# Patient Record
Sex: Male | Born: 1965 | Race: White | Hispanic: No | Marital: Married | Smoking: Never smoker
Health system: Southern US, Community
[De-identification: ages and names within clinical notes are randomized; demographics above are authoritative.]

## PROBLEM LIST (undated history)

## (undated) DIAGNOSIS — Z8042 Family history of malignant neoplasm of prostate: Secondary | ICD-10-CM

## (undated) DIAGNOSIS — C189 Malignant neoplasm of colon, unspecified: Secondary | ICD-10-CM

## (undated) DIAGNOSIS — Z803 Family history of malignant neoplasm of breast: Secondary | ICD-10-CM

## (undated) DIAGNOSIS — Z8489 Family history of other specified conditions: Secondary | ICD-10-CM

## (undated) DIAGNOSIS — M1990 Unspecified osteoarthritis, unspecified site: Secondary | ICD-10-CM

## (undated) HISTORY — DX: Family history of malignant neoplasm of breast: Z80.3

## (undated) HISTORY — DX: Unspecified osteoarthritis, unspecified site: M19.90

## (undated) HISTORY — DX: Family history of malignant neoplasm of prostate: Z80.42

---

## 2002-01-15 ENCOUNTER — Emergency Department (HOSPITAL_COMMUNITY): Admission: EM | Admit: 2002-01-15 | Discharge: 2002-01-15 | Payer: Self-pay | Admitting: *Deleted

## 2002-01-15 ENCOUNTER — Encounter: Payer: Self-pay | Admitting: *Deleted

## 2005-11-22 ENCOUNTER — Emergency Department (HOSPITAL_COMMUNITY): Admission: EM | Admit: 2005-11-22 | Discharge: 2005-11-22 | Payer: Self-pay | Admitting: Emergency Medicine

## 2005-11-27 ENCOUNTER — Emergency Department (HOSPITAL_COMMUNITY): Admission: EM | Admit: 2005-11-27 | Discharge: 2005-11-27 | Payer: Self-pay | Admitting: *Deleted

## 2006-01-10 ENCOUNTER — Emergency Department (HOSPITAL_COMMUNITY): Admission: EM | Admit: 2006-01-10 | Discharge: 2006-01-10 | Payer: Self-pay | Admitting: *Deleted

## 2006-09-17 ENCOUNTER — Emergency Department (HOSPITAL_COMMUNITY): Admission: EM | Admit: 2006-09-17 | Discharge: 2006-09-17 | Payer: Self-pay | Admitting: Emergency Medicine

## 2009-01-20 ENCOUNTER — Emergency Department (HOSPITAL_COMMUNITY): Admission: EM | Admit: 2009-01-20 | Discharge: 2009-01-20 | Payer: Self-pay | Admitting: Emergency Medicine

## 2010-12-23 LAB — PROTIME-INR: INR: 1 (ref 0.00–1.49)

## 2010-12-23 LAB — FECAL LACTOFERRIN, QUANT: Fecal Lactoferrin: POSITIVE

## 2010-12-23 LAB — OVA AND PARASITE EXAMINATION

## 2010-12-23 LAB — DIFFERENTIAL
Basophils Absolute: 0 10*3/uL (ref 0.0–0.1)
Basophils Relative: 0 % (ref 0–1)
Eosinophils Absolute: 0 10*3/uL (ref 0.0–0.7)
Eosinophils Relative: 0 % (ref 0–5)
Monocytes Absolute: 0.7 10*3/uL (ref 0.1–1.0)
Monocytes Relative: 5 % (ref 3–12)

## 2010-12-23 LAB — CBC
HCT: 45.2 % (ref 39.0–52.0)
Platelets: 224 10*3/uL (ref 150–400)
RDW: 13.1 % (ref 11.5–15.5)
WBC: 12.7 10*3/uL — ABNORMAL HIGH (ref 4.0–10.5)

## 2010-12-23 LAB — CLOSTRIDIUM DIFFICILE EIA

## 2010-12-23 LAB — STOOL CULTURE

## 2010-12-23 LAB — COMPREHENSIVE METABOLIC PANEL
ALT: 28 U/L (ref 0–53)
AST: 23 U/L (ref 0–37)
Albumin: 4.1 g/dL (ref 3.5–5.2)
Alkaline Phosphatase: 83 U/L (ref 39–117)
Chloride: 108 mEq/L (ref 96–112)
Potassium: 3.9 mEq/L (ref 3.5–5.1)
Sodium: 139 mEq/L (ref 135–145)
Total Bilirubin: 1.2 mg/dL (ref 0.3–1.2)
Total Protein: 7.2 g/dL (ref 6.0–8.3)

## 2015-09-19 HISTORY — PX: SHOULDER SURGERY: SHX246

## 2017-09-22 ENCOUNTER — Ambulatory Visit: Payer: Self-pay | Admitting: Emergency Medicine

## 2018-11-10 ENCOUNTER — Other Ambulatory Visit: Payer: Self-pay

## 2018-11-10 ENCOUNTER — Encounter: Payer: Self-pay | Admitting: Emergency Medicine

## 2018-11-10 ENCOUNTER — Ambulatory Visit: Payer: BC Managed Care – PPO | Admitting: Emergency Medicine

## 2018-11-10 VITALS — BP 116/78 | HR 75 | Temp 98.5°F | Resp 16 | Ht 69.25 in | Wt 266.4 lb

## 2018-11-10 DIAGNOSIS — R531 Weakness: Secondary | ICD-10-CM | POA: Diagnosis not present

## 2018-11-10 DIAGNOSIS — Z1211 Encounter for screening for malignant neoplasm of colon: Secondary | ICD-10-CM | POA: Insufficient documentation

## 2018-11-10 DIAGNOSIS — R635 Abnormal weight gain: Secondary | ICD-10-CM | POA: Insufficient documentation

## 2018-11-10 DIAGNOSIS — Z6839 Body mass index (BMI) 39.0-39.9, adult: Secondary | ICD-10-CM | POA: Diagnosis not present

## 2018-11-10 DIAGNOSIS — E669 Obesity, unspecified: Secondary | ICD-10-CM | POA: Insufficient documentation

## 2018-11-10 DIAGNOSIS — M2550 Pain in unspecified joint: Secondary | ICD-10-CM | POA: Insufficient documentation

## 2018-11-10 DIAGNOSIS — G479 Sleep disorder, unspecified: Secondary | ICD-10-CM

## 2018-11-10 HISTORY — DX: Sleep disorder, unspecified: G47.9

## 2018-11-10 NOTE — Progress Notes (Signed)
Ricardo [name] 53 y.o.   Chief Complaint  Patient presents with  . Establish Care    joint pain in thumbs  . Weight Gain    per patient causing depression-the last year    HISTORY OF PRESENT ILLNESS: This is a 54 y.o. male complaining of several things: 1.  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  They hurt all the time. 2.  Weight gain: 36 pounds in 1 year, making him depressed. 3.  Snores.  He may have sleep apnea. 4.  Feels sluggish with low metabolism and low energy. Denies chronic medical problems.  No chronic medications.  No other significant symptoms. Not feeling well is making him sad and depressed.    HPI   Prior to Admission medications   Not on File    No Known Allergies  There are no active problems to display for this patient.   No past medical history on file.    Social History   Socioeconomic History  . Marital status: Single    Spouse name: Not on file  . Number of children: Not on file  . Years of education: Not on file  . Highest education level: Not on file  Occupational History  . Not on file  Social Needs  . Financial resource strain: Not on file  . Food insecurity:    Worry: Not on file    Inability: Not on file  . Transportation needs:    Medical: Not on file    Non-medical: Not on file  Tobacco Use  . Smoking status: Never Smoker  . Smokeless tobacco: Never Used  Substance and Sexual Activity  . Alcohol use: Never    Frequency: Never    Comment: rare  . Drug use: Never  . Sexual activity: Not on file  Lifestyle  . Physical activity:    Days per week: Not on file    Minutes per session: Not on file  . Stress: Not on file  Relationships  . Social connections:    Talks on phone: Not on file    Gets together: Not on file    Attends religious service: Not on file    Active member of club or organization: Not on file    Attends meetings of clubs or organizations: Not on file    Relationship status: Not on file    . Intimate partner violence:    Fear of current or ex partner: Not on file    Emotionally abused: Not on file    Physically abused: Not on file    Forced sexual activity: Not on file  Other Topics Concern  . Not on file  Social History Narrative  . Not on file    No family history on file.   Review of Systems  Constitutional: Positive for malaise/fatigue. Negative for chills and fever.       Weight gain  HENT: Negative.  Negative for nosebleeds and sore throat.   Eyes: Negative.   Respiratory: Negative.  Negative for cough and shortness of breath.   Cardiovascular: Negative.  Negative for chest pain and palpitations.  Gastrointestinal: Negative.  Negative for abdominal pain, nausea and vomiting.  Genitourinary: Negative.   Musculoskeletal: Positive for joint pain.  Skin: Negative.   Neurological: Negative for dizziness and headaches.  Endo/Heme/Allergies: Negative.   Psychiatric/Behavioral: Positive for depression.  All other systems reviewed and are negative.  Vitals:   11/10/18 1548  BP: 116/78  Pulse: 75  Resp: 16  Temp: 98.5 F (36.9 C)  SpO2: 97%     Physical Exam Vitals signs reviewed.  Constitutional:      Appearance: He is obese.  HENT:     Head: Normocephalic and atraumatic.     Mouth/Throat:     Mouth: Mucous membranes are moist.     Pharynx: Oropharynx is clear.  Eyes:     Extraocular Movements: Extraocular movements intact.     Conjunctiva/sclera: Conjunctivae normal.     Pupils: Pupils are equal, round, and reactive to light.  Neck:     Musculoskeletal: Normal range of motion.  Cardiovascular:     Rate and Rhythm: Normal rate and regular rhythm.     Pulses: Normal pulses.     Heart sounds: Normal heart sounds.  Pulmonary:     Effort: Pulmonary effort is normal.     Breath sounds: Normal breath sounds.  Abdominal:     Palpations: Abdomen is soft. There is no mass.     Tenderness: There is no abdominal tenderness.  Musculoskeletal: Normal  range of motion.  Skin:    General: Skin is warm and dry.     Capillary Refill: Capillary refill takes less than 2 seconds.  Neurological:     General: No focal deficit present.     Mental Status: He is alert and oriented to person, place, and time.  Psychiatric:        Mood and Affect: Mood normal.        Behavior: Behavior normal.      ASSESSMENT & PLAN: Ricardo [name] was seen today for establish care and weight gain.  Diagnoses and all orders for this visit:  General weakness -     CBC with Differential/Platelet -     Comprehensive metabolic panel -     TestT+TestF+SHBG -     TSH -     Hemoglobin A1c -     Lipid panel  Colon cancer screening -     Ambulatory referral to Gastroenterology  Body mass index (BMI) of 39.0-39.9 in adult -     Amb ref to Medical Nutrition Therapy-MNT  Arthralgia, unspecified joint -     Ambulatory referral to Rheumatology  Sleep disorder -     Ambulatory referral to Neurology  Weight gain    Patient Instructions       If you have lab work done today you will be contacted with your lab results within the next 2 weeks.  If you have not heard from [place] then please contact us. The fastest way to get your results is to register for My Chart.   IF you received an x-ray today, you will receive an invoice from [facility]. Please contact [facility] at [phone] with questions or concerns regarding your invoice.   IF you received labwork today, you will receive an invoice from [place]. Please contact [facility] at [phone] with questions or concerns regarding your invoice.   Our billing staff will not be able to assist you with questions regarding bills from these companies.  You will be contacted with the lab results as soon as they are available. The fastest way to get your results is to activate your My Chart account. Instructions are located on the last page of this paperwork. If you have not heard from [place] regarding  the results in 2 weeks, please contact this office.      Calorie Counting for Weight Loss Calories are units of energy. Your body needs a certain amount of calories  from food to keep you going throughout the day. When you eat more calories than your body needs, your body stores the extra calories as fat. When you eat fewer calories than your body needs, your body burns fat to get the energy it needs. Calorie counting means keeping track of how many calories you eat and drink each day. Calorie counting can be helpful if you need to lose weight. If you make sure to eat fewer calories than your body needs, you should lose weight. Ask your health care provider what a healthy weight is for you. For calorie counting to work, you will need to eat the right number of calories in a day in order to lose a healthy amount of weight per week. A dietitian can help you determine how many calories you need in a day and will give you suggestions on how to reach your calorie goal.  A healthy amount of weight to lose per week is usually 1-2 lb (0.5-0.9 kg). This usually means that your daily calorie intake should be reduced by 500-750 calories.  Eating 1,200 - 1,500 calories per day can help most women lose weight.  Eating 1,500 - 1,800 calories per day can help most men lose weight. What is my plan? My goal is to have __________ calories per day. If I have this many calories per day, I should lose around __________ pounds per week. What do I need to know about calorie counting? In order to meet your daily calorie goal, you will need to:  Find out how many calories are in each food you would like to eat. Try to do this before you eat.  Decide how much of the food you plan to eat.  Write down what you ate and how many calories it had. Doing this is called keeping a food log. To successfully lose weight, it is important to balance calorie counting with a healthy lifestyle that includes regular activity. Aim for  150 minutes of moderate exercise (such as walking) or 75 minutes of vigorous exercise (such as running) each week. Where do I find calorie information?  The number of calories in a food can be found on a Nutrition Facts label. If a food does not have a Nutrition Facts label, try to look up the calories online or ask your dietitian for help. Remember that calories are listed per serving. If you choose to have more than one serving of a food, you will have to multiply the calories per serving by the amount of servings you plan to eat. For example, the label on a package of bread might say that a serving size is 1 slice and that there are 90 calories in a serving. If you eat 1 slice, you will have eaten 90 calories. If you eat 2 slices, you will have eaten 180 calories. How do I keep a food log? Immediately after each meal, record the following information in your food log:  What you ate. Don't forget to include toppings, sauces, and other extras on the food.  How much you ate. This can be measured in cups, ounces, or number of items.  How many calories each food and drink had.  The total number of calories in the meal. Keep your food log near you, such as in a small notebook in your pocket, or use a mobile app or website. Some programs will calculate calories for you and show you how many calories you have left for the day to meet your  goal. What are some calorie counting tips?   Use your calories on foods and drinks that will fill you up and not leave you hungry: ? Some examples of foods that fill you up are nuts and nut butters, vegetables, lean proteins, and high-fiber foods like whole grains. High-fiber foods are foods with more than 5 g fiber per serving. ? Drinks such as sodas, specialty coffee drinks, alcohol, and juices have a lot of calories, yet do not fill you up.  Eat nutritious foods and avoid empty calories. Empty calories are calories you get from foods or beverages that do not  have many vitamins or protein, such as candy, sweets, and soda. It is better to have a nutritious high-calorie food (such as an avocado) than a food with few nutrients (such as a bag of chips).  Know how many calories are in the foods you eat most often. This will help you calculate calorie counts faster.  Pay attention to calories in drinks. Low-calorie drinks include water and unsweetened drinks.  Pay attention to nutrition labels for "low fat" or "fat free" foods. These foods sometimes have the same amount of calories or more calories than the full fat versions. They also often have added sugar, starch, or salt, to make up for flavor that was removed with the fat.  Find a way of tracking calories that works for you. Get creative. Try different apps or programs if writing down calories does not work for you. What are some portion control tips?  Know how many calories are in a serving. This will help you know how many servings of a certain food you can have.  Use a measuring cup to measure serving sizes. You could also try weighing out portions on a kitchen scale. With time, you will be able to estimate serving sizes for some foods.  Take some time to put servings of different foods on your favorite plates, bowls, and cups so you know what a serving looks like.  Try not to eat straight from a bag or box. Doing this can lead to overeating. Put the amount you would like to eat in a cup or on a plate to make sure you are eating the right portion.  Use smaller plates, glasses, and bowls to prevent overeating.  Try not to multitask (for example, watch TV or use your computer) while eating. If it is time to eat, sit down at a table and enjoy your food. This will help you to know when you are full. It will also help you to be aware of what you are eating and how much you are eating. What are tips for following this plan? Reading food labels  Check the calorie count compared to the serving size.  The serving size may be smaller than what you are used to eating.  Check the source of the calories. Make sure the food you are eating is high in vitamins and protein and low in saturated and trans fats. Shopping  Read nutrition labels while you shop. This will help you make healthy decisions before you decide to purchase your food.  Make a grocery list and stick to it. Cooking  Try to cook your favorite foods in a healthier way. For example, try baking instead of frying.  Use low-fat dairy products. Meal planning  Use more fruits and vegetables. Half of your plate should be fruits and vegetables.  Include lean proteins like poultry and fish. How do I count calories when eating out?  Ask for smaller portion sizes.  Consider sharing an entree and sides instead of getting your own entree.  If you get your own entree, eat only half. Ask for a box at the beginning of your meal and put the rest of your entree in it so you are not tempted to eat it.  If calories are listed on the menu, choose the lower calorie options.  Choose dishes that include vegetables, fruits, whole grains, low-fat dairy products, and lean protein.  Choose items that are boiled, broiled, grilled, or steamed. Stay away from items that are buttered, battered, fried, or served with cream sauce. Items labeled "crispy" are usually fried, unless stated otherwise.  Choose water, low-fat milk, unsweetened iced tea, or other drinks without added sugar. If you want an alcoholic beverage, choose a lower calorie option such as a glass of wine or light beer.  Ask for dressings, sauces, and syrups on the side. These are usually high in calories, so you should limit the amount you eat.  If you want a salad, choose a garden salad and ask for grilled meats. Avoid extra toppings like bacon, cheese, or fried items. Ask for the dressing on the side, or ask for olive oil and vinegar or lemon to use as dressing.  Estimate how many  servings of a food you are given. For example, a serving of cooked rice is  cup or about the size of half a baseball. Knowing serving sizes will help you be aware of how much food you are eating at restaurants. The list below tells you how big or small some common portion sizes are based on everyday objects: ? 1 oz-4 stacked dice. ? 3 oz-1 deck of cards. ? 1 tsp-1 die. ? 1 Tbsp- a ping-pong ball. ? 2 Tbsp-1 ping-pong ball. ?  cup- baseball. ? 1 cup-1 baseball. Summary  Calorie counting means keeping track of how many calories you eat and drink each day. If you eat fewer calories than your body needs, you should lose weight.  A healthy amount of weight to lose per week is usually 1-2 lb (0.5-0.9 kg). This usually means reducing your daily calorie intake by 500-750 calories.  The number of calories in a food can be found on a Nutrition Facts label. If a food does not have a Nutrition Facts label, try to look up the calories online or ask your dietitian for help.  Use your calories on foods and drinks that will fill you up, and not on foods and drinks that will leave you hungry.  Use smaller plates, glasses, and bowls to prevent overeating. This information is not intended to replace advice given to you by your health care provider. Make sure you discuss any questions you have with your health care provider. Document Released: 08/31/2005 Document Revised: 05/20/2018 Document Reviewed: 07/31/2016 [employer]vier Interactive Patient Education  2019 [employer]      Agustina Caroli, MD [facility]

## 2018-11-10 NOTE — Patient Instructions (Addendum)
If you have lab work done today you will be contacted with your lab results within the next 2 weeks.  If you have not heard from [place] then please contact us. The fastest way to get your results is to register for My Chart.   IF you received an x-ray today, you will receive an invoice from [facility]. Please contact [facility] at [phone] with questions or concerns regarding your invoice.   IF you received labwork today, you will receive an invoice from [facility]. Please contact [facility] at [phone] with questions or concerns regarding your invoice.   Our billing staff will not be able to assist you with questions regarding bills from these companies.  You will be contacted with the lab results as soon as they are available. The fastest way to get your results is to activate your My Chart account. Instructions are located on the last page of this paperwork. If you have not heard from [place] regarding the results in 2 weeks, please contact this office.      Calorie Counting for Weight Loss Calories are units of energy. Your body needs a certain amount of calories from food to keep you going throughout the day. When you eat more calories than your body needs, your body stores the extra calories as fat. When you eat fewer calories than your body needs, your body burns fat to get the energy it needs. Calorie counting means keeping track of how many calories you eat and drink each day. Calorie counting can be helpful if you need to lose weight. If you make sure to eat fewer calories than your body needs, you should lose weight. Ask your health care provider what a healthy weight is for you. For calorie counting to work, you will need to eat the right number of calories in a day in order to lose a healthy amount of weight per week. A dietitian can help you determine how many calories you need in a day and will give you suggestions on how to reach your calorie goal.  A healthy  amount of weight to lose per week is usually 1-2 lb (0.5-0.9 kg). This usually means that your daily calorie intake should be reduced by 500-750 calories.  Eating 1,200 - 1,500 calories per day can help most women lose weight.  Eating 1,500 - 1,800 calories per day can help most men lose weight. What is my plan? My goal is to have __________ calories per day. If I have this many calories per day, I should lose around __________ pounds per week. What do I need to know about calorie counting? In order to meet your daily calorie goal, you will need to:  Find out how many calories are in each food you would like to eat. Try to do this before you eat.  Decide how much of the food you plan to eat.  Write down what you ate and how many calories it had. Doing this is called keeping a food log. To successfully lose weight, it is important to balance calorie counting with a healthy lifestyle that includes regular activity. Aim for 150 minutes of moderate exercise (such as walking) or 75 minutes of vigorous exercise (such as running) each week. Where do I find calorie information?  The number of calories in a food can be found on a Nutrition Facts label. If a food does not have a Nutrition Facts label, try to look up the calories online or ask your dietitian  for help. Remember that calories are listed per serving. If you choose to have more than one serving of a food, you will have to multiply the calories per serving by the amount of servings you plan to eat. For example, the label on a package of bread might say that a serving size is 1 slice and that there are 90 calories in a serving. If you eat 1 slice, you will have eaten 90 calories. If you eat 2 slices, you will have eaten 180 calories. How do I keep a food log? Immediately after each meal, record the following information in your food log:  What you ate. Don't forget to include toppings, sauces, and other extras on the food.  How much you  ate. This can be measured in cups, ounces, or number of items.  How many calories each food and drink had.  The total number of calories in the meal. Keep your food log near you, such as in a small notebook in your pocket, or use a mobile app or website. Some programs will calculate calories for you and show you how many calories you have left for the day to meet your goal. What are some calorie counting tips?   Use your calories on foods and drinks that will fill you up and not leave you hungry: ? Some examples of foods that fill you up are nuts and nut butters, vegetables, lean proteins, and high-fiber foods like whole grains. High-fiber foods are foods with more than 5 g fiber per serving. ? Drinks such as sodas, specialty coffee drinks, alcohol, and juices have a lot of calories, yet do not fill you up.  Eat nutritious foods and avoid empty calories. Empty calories are calories you get from foods or beverages that do not have many vitamins or protein, such as candy, sweets, and soda. It is better to have a nutritious high-calorie food (such as an avocado) than a food with few nutrients (such as a bag of chips).  Know how many calories are in the foods you eat most often. This will help you calculate calorie counts faster.  Pay attention to calories in drinks. Low-calorie drinks include water and unsweetened drinks.  Pay attention to nutrition labels for "low fat" or "fat free" foods. These foods sometimes have the same amount of calories or more calories than the full fat versions. They also often have added sugar, starch, or salt, to make up for flavor that was removed with the fat.  Find a way of tracking calories that works for you. Get creative. Try different apps or programs if writing down calories does not work for you. What are some portion control tips?  Know how many calories are in a serving. This will help you know how many servings of a certain food you can have.  Use a  measuring cup to measure serving sizes. You could also try weighing out portions on a kitchen scale. With time, you will be able to estimate serving sizes for some foods.  Take some time to put servings of different foods on your favorite plates, bowls, and cups so you know what a serving looks like.  Try not to eat straight from a bag or box. Doing this can lead to overeating. Put the amount you would like to eat in a cup or on a plate to make sure you are eating the right portion.  Use smaller plates, glasses, and bowls to prevent overeating.  Try not to multitask (for  example, watch TV or use your computer) while eating. If it is time to eat, sit down at a table and enjoy your food. This will help you to know when you are full. It will also help you to be aware of what you are eating and how much you are eating. What are tips for following this plan? Reading food labels  Check the calorie count compared to the serving size. The serving size may be smaller than what you are used to eating.  Check the source of the calories. Make sure the food you are eating is high in vitamins and protein and low in saturated and trans fats. Shopping  Read nutrition labels while you shop. This will help you make healthy decisions before you decide to purchase your food.  Make a grocery list and stick to it. Cooking  Try to cook your favorite foods in a healthier way. For example, try baking instead of frying.  Use low-fat dairy products. Meal planning  Use more fruits and vegetables. Half of your plate should be fruits and vegetables.  Include lean proteins like poultry and fish. How do I count calories when eating out?  Ask for smaller portion sizes.  Consider sharing an entree and sides instead of getting your own entree.  If you get your own entree, eat only half. Ask for a box at the beginning of your meal and put the rest of your entree in it so you are not tempted to eat it.  If calories  are listed on the menu, choose the lower calorie options.  Choose dishes that include vegetables, fruits, whole grains, low-fat dairy products, and lean protein.  Choose items that are boiled, broiled, grilled, or steamed. Stay away from items that are buttered, battered, fried, or served with cream sauce. Items labeled "crispy" are usually fried, unless stated otherwise.  Choose water, low-fat milk, unsweetened iced tea, or other drinks without added sugar. If you want an alcoholic beverage, choose a lower calorie option such as a glass of wine or light beer.  Ask for dressings, sauces, and syrups on the side. These are usually high in calories, so you should limit the amount you eat.  If you want a salad, choose a garden salad and ask for grilled meats. Avoid extra toppings like bacon, cheese, or fried items. Ask for the dressing on the side, or ask for olive oil and vinegar or lemon to use as dressing.  Estimate how many servings of a food you are given. For example, a serving of cooked rice is  cup or about the size of half a baseball. Knowing serving sizes will help you be aware of how much food you are eating at restaurants. The list below tells you how big or small some common portion sizes are based on everyday objects: ? 1 oz-4 stacked dice. ? 3 oz-1 deck of cards. ? 1 tsp-1 die. ? 1 Tbsp- a ping-pong ball. ? 2 Tbsp-1 ping-pong ball. ?  cup- baseball. ? 1 cup-1 baseball. Summary  Calorie counting means keeping track of how many calories you eat and drink each day. If you eat fewer calories than your body needs, you should lose weight.  A healthy amount of weight to lose per week is usually 1-2 lb (0.5-0.9 kg). This usually means reducing your daily calorie intake by 500-750 calories.  The number of calories in a food can be found on a Nutrition Facts label. If a food does not have a Nutrition  Facts label, try to look up the calories online or ask your dietitian for help.  Use  your calories on foods and drinks that will fill you up, and not on foods and drinks that will leave you hungry.  Use smaller plates, glasses, and bowls to prevent overeating. This information is not intended to replace advice given to you by your health care provider. Make sure you discuss any questions you have with your health care provider. Document Released: 08/31/2005 Document Revised: 05/20/2018 Document Reviewed: 07/31/2016 Elsevier Interactive Patient Education  2019 [employer].

## 2018-11-11 ENCOUNTER — Encounter: Payer: Self-pay | Admitting: *Deleted

## 2018-11-14 ENCOUNTER — Other Ambulatory Visit: Payer: Self-pay | Admitting: Emergency Medicine

## 2018-11-14 LAB — CBC WITH DIFFERENTIAL/PLATELET
BASOS ABS: 0 10*3/uL (ref 0.0–0.2)
Basos: 1 %
EOS (ABSOLUTE): 0.1 10*3/uL (ref 0.0–0.4)
EOS: 2 %
Hematocrit: 46.3 % (ref 37.5–51.0)
Hemoglobin: 15.9 g/dL (ref 13.0–17.7)
IMMATURE GRANS (ABS): 0 10*3/uL (ref 0.0–0.1)
Immature Granulocytes: 0 %
Lymphocytes Absolute: 1.6 10*3/uL (ref 0.7–3.1)
Lymphs: 29 %
MCH: 29.9 pg (ref 26.6–33.0)
MCHC: 34.3 g/dL (ref 31.5–35.7)
MCV: 87 fL (ref 79–97)
MONOCYTES: 10 %
MONOS ABS: 0.6 10*3/uL (ref 0.1–0.9)
Neutrophils Absolute: 3.3 10*3/uL (ref 1.4–7.0)
Neutrophils: 58 %
PLATELETS: 290 10*3/uL (ref 150–450)
RBC: 5.32 x10E6/uL (ref 4.14–5.80)
RDW: 13 % (ref 11.6–15.4)
WBC: 5.7 10*3/uL (ref 3.4–10.8)

## 2018-11-14 LAB — COMPREHENSIVE METABOLIC PANEL
A/G RATIO: 1.6 (ref 1.2–2.2)
ALK PHOS: 101 IU/L (ref 39–117)
ALT: 25 IU/L (ref 0–44)
AST: 18 IU/L (ref 0–40)
Albumin: 4.6 g/dL (ref 3.8–4.9)
BUN/Creatinine Ratio: 14 (ref 9–20)
BUN: 15 mg/dL (ref 6–24)
Bilirubin Total: 0.3 mg/dL (ref 0.0–1.2)
CHLORIDE: 102 mmol/L (ref 96–106)
CO2: 22 mmol/L (ref 20–29)
Calcium: 9.4 mg/dL (ref 8.7–10.2)
Creatinine, Ser: 1.11 mg/dL (ref 0.76–1.27)
GFR calc Af Amer: 87 mL/min/{1.73_m2} (ref >59)
GFR calc non Af Amer: 75 mL/min/{1.73_m2} (ref >59)
GLUCOSE: 86 mg/dL (ref 65–99)
Globulin, Total: 2.8 g/dL (ref 1.5–4.5)
POTASSIUM: 4.9 mmol/L (ref 3.5–5.2)
Sodium: 139 mmol/L (ref 134–144)
TOTAL PROTEIN: 7.4 g/dL (ref 6.0–8.5)

## 2018-11-14 LAB — LIPID PANEL
CHOL/HDL RATIO: 6.9 ratio — AB (ref 0.0–5.0)
CHOLESTEROL TOTAL: 282 mg/dL — AB (ref 100–199)
HDL: 41 mg/dL (ref >39)
LDL CALC: 184 mg/dL — AB (ref 0–99)
Triglycerides: 287 mg/dL — ABNORMAL HIGH (ref 0–149)
VLDL CHOLESTEROL CAL: 57 mg/dL — AB (ref 5–40)

## 2018-11-14 LAB — TSH: TSH: 2.32 u[IU]/mL (ref 0.450–4.500)

## 2018-11-14 LAB — TESTT+TESTF+SHBG
Sex Hormone Binding: 56.1 nmol/L (ref 19.3–76.4)
TESTOSTERONE FREE: 7.6 pg/mL (ref 7.2–24.0)
TESTOSTERONE, TOTAL, LC/MS: 555.4 ng/dL (ref 264.0–916.0)

## 2018-11-14 LAB — HEMOGLOBIN A1C
Est. average glucose Bld gHb Est-mCnc: 103 mg/dL
Hgb A1c MFr Bld: 5.2 % (ref 4.8–5.6)

## 2018-11-14 MED ORDER — ROSUVASTATIN CALCIUM 10 MG PO TABS: 10.0000 mg | ORAL_TABLET | Freq: Every day | ORAL | 3 refills | Status: DC

## 2018-11-14 NOTE — Progress Notes (Signed)
Crestor prescribed.

## 2018-11-15 ENCOUNTER — Encounter: Payer: Self-pay | Admitting: *Deleted

## 2018-11-15 ENCOUNTER — Telehealth: Payer: Self-pay | Admitting: *Deleted

## 2018-11-15 NOTE — Telephone Encounter (Signed)
Spoke to patient at his mobile number [phone] with lab results and start Crestor 10 mg daily. Follow up with specialist as discussed, per Dr Mitchel Honour. Patient understood message.

## 2018-11-18 ENCOUNTER — Telehealth: Payer: Self-pay | Admitting: *Deleted

## 2018-11-18 NOTE — Telephone Encounter (Signed)
Faxed lab results to ATTN: Warner Mccreedy Referral Coordinator. Confirmation page received at 11:47 am.

## 2019-01-17 ENCOUNTER — Encounter (HOSPITAL_COMMUNITY): Payer: Self-pay

## 2019-01-17 ENCOUNTER — Emergency Department (HOSPITAL_COMMUNITY): Payer: BC Managed Care – PPO | Admitting: Anesthesiology

## 2019-01-17 ENCOUNTER — Other Ambulatory Visit: Payer: Self-pay

## 2019-01-17 ENCOUNTER — Ambulatory Visit: Payer: Self-pay

## 2019-01-17 ENCOUNTER — Encounter (HOSPITAL_COMMUNITY)
Admission: EM | Disposition: A | Discharge status: Home / Self Care | Payer: Self-pay | Source: Home / Self Care | Attending: Emergency Medicine

## 2019-01-17 ENCOUNTER — Observation Stay (HOSPITAL_COMMUNITY)
Admission: EM | Admit: 2019-01-17 | Discharge: 2019-01-18 | Disposition: A | Discharge status: Home / Self Care | Payer: BC Managed Care – PPO | Attending: General Surgery | Admitting: General Surgery

## 2019-01-17 ENCOUNTER — Emergency Department (HOSPITAL_COMMUNITY): Payer: BC Managed Care – PPO

## 2019-01-17 DIAGNOSIS — Z6839 Body mass index (BMI) 39.0-39.9, adult: Secondary | ICD-10-CM | POA: Insufficient documentation

## 2019-01-17 DIAGNOSIS — M1990 Unspecified osteoarthritis, unspecified site: Secondary | ICD-10-CM | POA: Insufficient documentation

## 2019-01-17 DIAGNOSIS — K3580 Unspecified acute appendicitis: Secondary | ICD-10-CM | POA: Diagnosis present

## 2019-01-17 DIAGNOSIS — Z79899 Other long term (current) drug therapy: Secondary | ICD-10-CM | POA: Diagnosis not present

## 2019-01-17 DIAGNOSIS — E785 Hyperlipidemia, unspecified: Secondary | ICD-10-CM | POA: Insufficient documentation

## 2019-01-17 DIAGNOSIS — E669 Obesity, unspecified: Secondary | ICD-10-CM | POA: Diagnosis not present

## 2019-01-17 DIAGNOSIS — Z1159 Encounter for screening for other viral diseases: Secondary | ICD-10-CM | POA: Diagnosis not present

## 2019-01-17 HISTORY — PX: LAPAROSCOPIC APPENDECTOMY: SHX408

## 2019-01-17 HISTORY — DX: Family history of other specified conditions: Z84.89

## 2019-01-17 LAB — CBC WITH DIFFERENTIAL/PLATELET
Abs Immature Granulocytes: 0.06 10*3/uL (ref 0.00–0.07)
Basophils Absolute: 0 10*3/uL (ref 0.0–0.1)
Basophils Relative: 0 %
Eosinophils Absolute: 0 10*3/uL (ref 0.0–0.5)
Eosinophils Relative: 0 %
HCT: 46 % (ref 39.0–52.0)
Hemoglobin: 15.9 g/dL (ref 13.0–17.0)
Immature Granulocytes: 0 %
Lymphocytes Relative: 7 %
Lymphs Abs: 1 10*3/uL (ref 0.7–4.0)
MCH: 30.5 pg (ref 26.0–34.0)
MCHC: 34.6 g/dL (ref 30.0–36.0)
MCV: 88.3 fL (ref 80.0–100.0)
Monocytes Absolute: 0.9 10*3/uL (ref 0.1–1.0)
Monocytes Relative: 6 %
Neutro Abs: 13.6 10*3/uL — ABNORMAL HIGH (ref 1.7–7.7)
Neutrophils Relative %: 87 %
Platelets: 261 10*3/uL (ref 150–400)
RBC: 5.21 MIL/uL (ref 4.22–5.81)
RDW: 13 % (ref 11.5–15.5)
WBC: 15.6 10*3/uL — ABNORMAL HIGH (ref 4.0–10.5)
nRBC: 0 % (ref 0.0–0.2)

## 2019-01-17 LAB — BASIC METABOLIC PANEL
Anion gap: 8 (ref 5–15)
BUN: 12 mg/dL (ref 6–20)
CO2: 24 mmol/L (ref 22–32)
Calcium: 9.5 mg/dL (ref 8.9–10.3)
Chloride: 110 mmol/L (ref 98–111)
Creatinine, Ser: 1.24 mg/dL (ref 0.61–1.24)
GFR calc Af Amer: >60 mL/min (ref >60)
GFR calc non Af Amer: >60 mL/min (ref >60)
Glucose, Bld: 105 mg/dL — ABNORMAL HIGH (ref 70–99)
Potassium: 4.3 mmol/L (ref 3.5–5.1)
Sodium: 142 mmol/L (ref 135–145)

## 2019-01-17 LAB — SARS CORONAVIRUS 2 BY RT PCR (HOSPITAL ORDER, PERFORMED IN ~~LOC~~ HOSPITAL LAB): SARS Coronavirus 2: NEGATIVE

## 2019-01-17 LAB — I-STAT CREATININE, ED: Creatinine, Ser: 1.2 mg/dL (ref 0.61–1.24)

## 2019-01-17 SURGERY — APPENDECTOMY, LAPAROSCOPIC
Anesthesia: General | Site: Abdomen

## 2019-01-17 MED ORDER — GABAPENTIN 300 MG PO CAPS: 300.0000 mg | ORAL_CAPSULE | ORAL | Status: DC

## 2019-01-17 MED ORDER — MIDAZOLAM HCL 2 MG/2ML IJ SOLN
INTRAMUSCULAR | Status: DC | PRN
  Administered 2019-01-17: 2 mg via INTRAVENOUS

## 2019-01-17 MED ORDER — IOHEXOL 300 MG/ML  SOLN
100.0000 mL | Freq: Once | INTRAMUSCULAR | Status: AC | PRN
  Administered 2019-01-17: 100 mL via INTRAVENOUS

## 2019-01-17 MED ORDER — ONDANSETRON 4 MG PO TBDP: 4.0000 mg | ORAL_TABLET | Freq: Four times a day (QID) | ORAL | Status: DC | PRN

## 2019-01-17 MED ORDER — DEXAMETHASONE SODIUM PHOSPHATE 10 MG/ML IJ SOLN
INTRAMUSCULAR | Status: DC | PRN
  Administered 2019-01-17: 10 mg via INTRAVENOUS

## 2019-01-17 MED ORDER — ONDANSETRON HCL 4 MG/2ML IJ SOLN
INTRAMUSCULAR | Status: AC
  Filled 2019-01-17: qty 2

## 2019-01-17 MED ORDER — SODIUM CHLORIDE 0.9 % IV SOLN
2.0000 g | Freq: Once | INTRAVENOUS | Status: AC
  Administered 2019-01-17: 2 g via INTRAVENOUS
  Filled 2019-01-17: qty 2

## 2019-01-17 MED ORDER — LIDOCAINE 2% (20 MG/ML) 5 ML SYRINGE
INTRAMUSCULAR | Status: AC
  Filled 2019-01-17: qty 5

## 2019-01-17 MED ORDER — ROCURONIUM BROMIDE 10 MG/ML (PF) SYRINGE
PREFILLED_SYRINGE | INTRAVENOUS | Status: AC
  Filled 2019-01-17: qty 10

## 2019-01-17 MED ORDER — OXYCODONE HCL 5 MG PO TABS
5.0000 mg | ORAL_TABLET | Freq: Once | ORAL | Status: AC | PRN
  Administered 2019-01-17: 5 mg via ORAL

## 2019-01-17 MED ORDER — PHENYLEPHRINE HCL (PRESSORS) 10 MG/ML IV SOLN
INTRAVENOUS | Status: DC | PRN
  Administered 2019-01-17: 80 ug via INTRAVENOUS

## 2019-01-17 MED ORDER — PROMETHAZINE HCL 25 MG/ML IJ SOLN: 6.2500 mg | INTRAMUSCULAR | Status: DC | PRN

## 2019-01-17 MED ORDER — OXYCODONE HCL 5 MG PO TABS
ORAL_TABLET | ORAL | Status: AC
  Filled 2019-01-17: qty 1

## 2019-01-17 MED ORDER — PROPOFOL 10 MG/ML IV BOLUS
INTRAVENOUS | Status: AC
  Filled 2019-01-17: qty 20

## 2019-01-17 MED ORDER — CELECOXIB 200 MG PO CAPS: 400.0000 mg | ORAL_CAPSULE | ORAL | Status: DC

## 2019-01-17 MED ORDER — ONDANSETRON HCL 4 MG/2ML IJ SOLN
4.0000 mg | Freq: Once | INTRAMUSCULAR | Status: AC
  Administered 2019-01-17: 4 mg via INTRAVENOUS
  Filled 2019-01-17: qty 2

## 2019-01-17 MED ORDER — MIDAZOLAM HCL 2 MG/2ML IJ SOLN
INTRAMUSCULAR | Status: AC
  Filled 2019-01-17: qty 2

## 2019-01-17 MED ORDER — LIDOCAINE HCL (CARDIAC) PF 100 MG/5ML IV SOSY
PREFILLED_SYRINGE | INTRAVENOUS | Status: DC | PRN
  Administered 2019-01-17: 60 mg via INTRATRACHEAL

## 2019-01-17 MED ORDER — OXYCODONE HCL 5 MG/5ML PO SOLN: 5.0000 mg | Freq: Once | ORAL | Status: AC | PRN

## 2019-01-17 MED ORDER — FENTANYL CITRATE (PF) 100 MCG/2ML IJ SOLN: 25.0000 ug | INTRAMUSCULAR | Status: DC | PRN

## 2019-01-17 MED ORDER — SUGAMMADEX SODIUM 200 MG/2ML IV SOLN
INTRAVENOUS | Status: DC | PRN
  Administered 2019-01-17: 300 mg via INTRAVENOUS

## 2019-01-17 MED ORDER — ONDANSETRON HCL 4 MG/2ML IJ SOLN
4.0000 mg | Freq: Four times a day (QID) | INTRAMUSCULAR | Status: DC | PRN
  Administered 2019-01-17: 4 mg via INTRAVENOUS
  Filled 2019-01-17: qty 2

## 2019-01-17 MED ORDER — BUPIVACAINE HCL (PF) 0.25 % IJ SOLN
INTRAMUSCULAR | Status: DC | PRN
  Administered 2019-01-17: 6 mL

## 2019-01-17 MED ORDER — ONDANSETRON HCL 4 MG/2ML IJ SOLN
INTRAMUSCULAR | Status: DC | PRN
  Administered 2019-01-17: 4 mg via INTRAVENOUS

## 2019-01-17 MED ORDER — ONDANSETRON 4 MG PO TBDP: 8.0000 mg | ORAL_TABLET | Freq: Once | ORAL | Status: DC

## 2019-01-17 MED ORDER — BUPIVACAINE HCL (PF) 0.25 % IJ SOLN
INTRAMUSCULAR | Status: AC
  Filled 2019-01-17: qty 30

## 2019-01-17 MED ORDER — HYDROMORPHONE HCL 1 MG/ML IJ SOLN
1.0000 mg | INTRAMUSCULAR | Status: DC | PRN
  Administered 2019-01-17: 1 mg via INTRAVENOUS
  Filled 2019-01-17: qty 1

## 2019-01-17 MED ORDER — SODIUM CHLORIDE 0.9 % IR SOLN
Status: DC | PRN
  Administered 2019-01-17: 1000 mL

## 2019-01-17 MED ORDER — OXYCODONE HCL 5 MG PO TABS
5.0000 mg | ORAL_TABLET | ORAL | Status: DC | PRN
  Administered 2019-01-18 (×2): 10 mg via ORAL
  Filled 2019-01-17 (×2): qty 2

## 2019-01-17 MED ORDER — DEXAMETHASONE SODIUM PHOSPHATE 10 MG/ML IJ SOLN
INTRAMUSCULAR | Status: AC
  Filled 2019-01-17: qty 1

## 2019-01-17 MED ORDER — SUCCINYLCHOLINE CHLORIDE 200 MG/10ML IV SOSY
PREFILLED_SYRINGE | INTRAVENOUS | Status: AC
  Filled 2019-01-17: qty 10

## 2019-01-17 MED ORDER — DEXTROSE-NACL 5-0.9 % IV SOLN
INTRAVENOUS | Status: DC
  Administered 2019-01-17 – 2019-01-18 (×2): via INTRAVENOUS

## 2019-01-17 MED ORDER — 0.9 % SODIUM CHLORIDE (POUR BTL) OPTIME
TOPICAL | Status: DC | PRN
  Administered 2019-01-17: 19:00:00 1000 mL

## 2019-01-17 MED ORDER — PROPOFOL 10 MG/ML IV BOLUS
INTRAVENOUS | Status: DC | PRN
  Administered 2019-01-17: 200 mg via INTRAVENOUS
  Administered 2019-01-17: 60 mg via INTRAVENOUS

## 2019-01-17 MED ORDER — LACTATED RINGERS IV SOLN
INTRAVENOUS | Status: DC
  Administered 2019-01-17 (×2): via INTRAVENOUS

## 2019-01-17 MED ORDER — HYDROMORPHONE HCL 1 MG/ML IJ SOLN
1.0000 mg | Freq: Once | INTRAMUSCULAR | Status: AC
  Administered 2019-01-17: 1 mg via INTRAVENOUS
  Filled 2019-01-17: qty 1

## 2019-01-17 MED ORDER — SUCCINYLCHOLINE 20MG/ML (10ML) SYRINGE FOR MEDFUSION PUMP - OPTIME
INTRAMUSCULAR | Status: DC | PRN
  Administered 2019-01-17: 120 mg via INTRAVENOUS

## 2019-01-17 MED ORDER — FENTANYL CITRATE (PF) 250 MCG/5ML IJ SOLN
INTRAMUSCULAR | Status: AC
  Filled 2019-01-17: qty 5

## 2019-01-17 MED ORDER — ACETAMINOPHEN 500 MG PO TABS: 1000.0000 mg | ORAL_TABLET | ORAL | Status: DC

## 2019-01-17 MED ORDER — TRAMADOL HCL 50 MG PO TABS: 50.0000 mg | ORAL_TABLET | Freq: Four times a day (QID) | ORAL | Status: DC | PRN

## 2019-01-17 MED ORDER — ROCURONIUM 10MG/ML (10ML) SYRINGE FOR MEDFUSION PUMP - OPTIME
INTRAVENOUS | Status: DC | PRN
  Administered 2019-01-17: 10 mg via INTRAVENOUS
  Administered 2019-01-17: 50 mg via INTRAVENOUS

## 2019-01-17 MED ORDER — FENTANYL CITRATE (PF) 250 MCG/5ML IJ SOLN
INTRAMUSCULAR | Status: DC | PRN
  Administered 2019-01-17: 100 ug via INTRAVENOUS
  Administered 2019-01-17: 50 ug via INTRAVENOUS
  Administered 2019-01-17: 100 ug via INTRAVENOUS

## 2019-01-17 SURGICAL SUPPLY — 42 items
APPLIER CLIP 5 13 M/L LIGAMAX5 (MISCELLANEOUS)
BLADE CLIPPER SURG (BLADE) ×2 IMPLANT
CANISTER SUCT 3000ML PPV (MISCELLANEOUS) ×2 IMPLANT
CHLORAPREP W/TINT 26ML (MISCELLANEOUS) ×2 IMPLANT
CLIP APPLIE 5 13 M/L LIGAMAX5 (MISCELLANEOUS) IMPLANT
CLIP VESOLOCK XL 6/CT (CLIP) ×2 IMPLANT
COVER SURGICAL LIGHT HANDLE (MISCELLANEOUS) ×2 IMPLANT
COVER TRANSDUCER ULTRASND (DRAPES) ×2 IMPLANT
COVER WAND RF STERILE (DRAPES) ×2 IMPLANT
DERMABOND ADHESIVE PROPEN (GAUZE/BANDAGES/DRESSINGS) ×1
DERMABOND ADVANCED (GAUZE/BANDAGES/DRESSINGS) ×1
DERMABOND ADVANCED .7 DNX12 (GAUZE/BANDAGES/DRESSINGS) ×1 IMPLANT
DERMABOND ADVANCED .7 DNX6 (GAUZE/BANDAGES/DRESSINGS) ×1 IMPLANT
ELECT REM PT RETURN 9FT ADLT (ELECTROSURGICAL) ×2
ELECTRODE REM PT RTRN 9FT ADLT (ELECTROSURGICAL) ×1 IMPLANT
ENDOLOOP SUT PDS II  0 18 (SUTURE) ×2
ENDOLOOP SUT PDS II 0 18 (SUTURE) ×2 IMPLANT
GLOVE BIO SURGEON STRL SZ7.5 (GLOVE) ×2 IMPLANT
GOWN STRL REUS W/ TWL LRG LVL3 (GOWN DISPOSABLE) ×2 IMPLANT
GOWN STRL REUS W/ TWL XL LVL3 (GOWN DISPOSABLE) ×1 IMPLANT
GOWN STRL REUS W/TWL LRG LVL3 (GOWN DISPOSABLE) ×2
GOWN STRL REUS W/TWL XL LVL3 (GOWN DISPOSABLE) ×1
GRASPER SUT TROCAR 14GX15 (MISCELLANEOUS) ×2 IMPLANT
KIT BASIN OR (CUSTOM PROCEDURE TRAY) ×2 IMPLANT
KIT TURNOVER KIT B (KITS) ×2 IMPLANT
NEEDLE INSUFFLATION 14GA 120MM (NEEDLE) ×2 IMPLANT
NS IRRIG 1000ML POUR BTL (IV SOLUTION) ×2 IMPLANT
PAD ARMBOARD 7.5X6 YLW CONV (MISCELLANEOUS) ×4 IMPLANT
SCISSORS LAP 5X35 DISP (ENDOMECHANICALS) ×2 IMPLANT
SET IRRIG TUBING LAPAROSCOPIC (IRRIGATION / IRRIGATOR) ×2 IMPLANT
SET TUBE SMOKE EVAC HIGH FLOW (TUBING) ×2 IMPLANT
SLEEVE ENDOPATH XCEL 5M (ENDOMECHANICALS) ×2 IMPLANT
SPECIMEN JAR SMALL (MISCELLANEOUS) ×2 IMPLANT
SUT MNCRL AB 4-0 PS2 18 (SUTURE) ×2 IMPLANT
TOWEL OR 17X24 6PK STRL BLUE (TOWEL DISPOSABLE) ×2 IMPLANT
TOWEL OR 17X26 10 PK STRL BLUE (TOWEL DISPOSABLE) ×2 IMPLANT
TRAY FOLEY CATH SILVER 16FR (SET/KITS/TRAYS/PACK) ×2 IMPLANT
TRAY LAPAROSCOPIC MC (CUSTOM PROCEDURE TRAY) ×2 IMPLANT
TROCAR BLADELESS 11MM (ENDOMECHANICALS) ×2 IMPLANT
TROCAR XCEL NON-BLD 11X100MML (ENDOMECHANICALS) IMPLANT
TROCAR XCEL NON-BLD 5MMX100MML (ENDOMECHANICALS) ×2 IMPLANT
WATER STERILE IRR 1000ML POUR (IV SOLUTION) IMPLANT

## 2019-01-17 NOTE — Anesthesia Preprocedure Evaluation (Addendum)
Anesthesia Evaluation  Patient identified by MRN, date of birth, ID band Patient awake    Reviewed: Allergy & Precautions, NPO status , Patient's Chart, lab work & pertinent test results  History of Anesthesia Complications Negative for: history of anesthetic complications  Airway Mallampati: II  TM Distance: >3 FB Neck ROM: Full    Dental  (+) Dental Advisory Given, Caps   Pulmonary neg pulmonary ROS,    breath sounds clear to auscultation       Cardiovascular negative cardio ROS   Rhythm:Regular Rate:Normal     Neuro/Psych negative neurological ROS  negative psych ROS   GI/Hepatic negative GI ROS, Neg liver ROS,   Endo/Other   Obesity   Renal/GU negative Renal ROS     Musculoskeletal  (+) Arthritis ,   Abdominal (+) + obese,   Peds  Hematology negative hematology ROS (+)   Anesthesia Other Findings   Reproductive/Obstetrics                            Anesthesia Physical Anesthesia Plan  ASA: II and emergent  Anesthesia Plan: General   Post-op Pain Management:    Induction: Intravenous and Rapid sequence  PONV Risk Score and Plan: 4 or greater and Treatment may vary due to age or medical condition, Ondansetron, Dexamethasone and Midazolam  Airway Management Planned: Oral ETT  Additional Equipment: None  Intra-op Plan:   Post-operative Plan: Extubation in OR  Informed Consent: I have reviewed the patients History and Physical, chart, labs and discussed the procedure including the risks, benefits and alternatives for the proposed anesthesia with the patient or authorized representative who has indicated his/her understanding and acceptance.     Dental advisory given  Plan Discussed with: CRNA and Anesthesiologist  Anesthesia Plan Comments:        Anesthesia Quick Evaluation

## 2019-01-17 NOTE — ED Notes (Signed)
Cell phone and wallet are with security.

## 2019-01-17 NOTE — Anesthesia Procedure Notes (Signed)
Procedure Name: Intubation Date/Time: 01/17/2019 8:20 PM Performed by: Valetta Fuller, CRNA Pre-anesthesia Checklist: Patient identified, Emergency Drugs available, Suction available and Patient being monitored Patient Re-evaluated:Patient Re-evaluated prior to induction Oxygen Delivery Method: Circle system utilized Preoxygenation: Pre-oxygenation with 100% oxygen Induction Type: IV induction, Cricoid Pressure applied and Rapid sequence Laryngoscope Size: 2 and Miller Grade View: Grade I Tube type: Oral Tube size: 7.5 mm Number of attempts: 1 Airway Equipment and Method: Stylet Placement Confirmation: ETT inserted through vocal cords under direct vision,  positive ETCO2 and breath sounds checked- equal and bilateral Secured at: 23 cm Tube secured with: Tape Dental Injury: Teeth and Oropharynx as per pre-operative assessment

## 2019-01-17 NOTE — ED Notes (Addendum)
Pt transported to shortstay 36. Consent signed by surgeon, pt and RN, transferred with pt to OR.

## 2019-01-17 NOTE — ED Notes (Signed)
Patient transported to CT 

## 2019-01-17 NOTE — ED Triage Notes (Signed)
Pt arrives POV for RLQ abdominal pain since 0330. Pt reports it has been progressing since. Pt reports n/v with 5 episodes of emesis. Last BM yesterday. Pt denies urinary symptoms.

## 2019-01-17 NOTE — Progress Notes (Signed)
Pt. Nauseated. Dr. Fransisco Beau stated not to give tylenol, celebrex, and gabapentin pre-op.

## 2019-01-17 NOTE — Transfer of Care (Signed)
Immediate Anesthesia Transfer of Care Note  Patient: Ricardo [name]  Procedure(s) Performed: APPENDECTOMY LAPAROSCOPIC (N/A Abdomen)  Patient Location: PACU  Anesthesia Type:General  Level of Consciousness: sedated  Airway & Oxygen Therapy: Patient connected to face mask oxygen  Post-op Assessment: Report given to RN and Post -op Vital signs reviewed and stable  Post vital signs: Reviewed and stable  Last Vitals:  Vitals Value Taken Time  BP 136/60 01/17/2019  9:40 PM  Temp 36.3 C 01/17/2019  9:40 PM  Pulse 89 01/17/2019  9:46 PM  Resp 16 01/17/2019  9:46 PM  SpO2 95 % 01/17/2019  9:46 PM  Vitals shown include unvalidated device data.  Last Pain:  Vitals:   01/17/19 2140  TempSrc:   PainSc: Asleep      Patients Stated Pain Goal: 6 (46/27/03 5009)  Complications: No apparent anesthesia complications

## 2019-01-17 NOTE — ED Notes (Signed)
ED Provider at bedside. 

## 2019-01-17 NOTE — Progress Notes (Addendum)
Pt transfer from PACU. Pt alert and oriented x4. S/P lap appy, noted with 3 lap sites(skin glue) to abdomen. Oriented to room and call bell.

## 2019-01-17 NOTE — H&P (Signed)
Ricardo [name] is an 53 y.o. male.   Chief Complaint: Abdominal pain HPI: Patient is a 53 year old male with a history of hyperlipidemia who comes in with abdominal pain that began at 3:30 AM.  Patient states that the pain was generalized and crampy in nature.  He states this continued to increase in severity.  He states this was associated with nausea and vomiting.  Patient states that the pain migrated to the right lower quadrant area.  Patient denies any fever while at home.  Secondary to ongoing pain patient presented to the ER for further evaluation and management.  Upon evaluation the ER patient underwent CT scan which revealed signs consistent with acute appendicitis.  I did review the scans personally.  General surgery was consulted for further evaluation and management.  Past Medical History:  Diagnosis Date  . Arthritis     History reviewed. No pertinent surgical history.  Family History  Problem Relation Age of Onset  . Cancer Sister   . Cancer Brother   . High Cholesterol Brother    Social History:  reports that he has never smoked. He has never used smokeless tobacco. He reports that he does not drink alcohol or use drugs.  Allergies: No Known Allergies  (Not in a hospital admission)   Results for orders placed or performed during the hospital encounter of 01/17/19 (from the past 48 hour(s))  CBC with Differential     Status: Abnormal   Collection Time: 01/17/19  4:26 PM  Result Value Ref Range   WBC 15.6 (H) 4.0 - 10.5 K/uL   RBC 5.21 4.22 - 5.81 MIL/uL   Hemoglobin 15.9 13.0 - 17.0 g/dL   HCT 46.0 39.0 - 52.0 %   MCV 88.3 80.0 - 100.0 fL   MCH 30.5 26.0 - 34.0 pg   MCHC 34.6 30.0 - 36.0 g/dL   RDW 13.0 11.5 - 15.5 %   Platelets 261 150 - 400 K/uL   nRBC 0.0 0.0 - 0.2 %   Neutrophils Relative % 87 %   Neutro Abs 13.6 (H) 1.7 - 7.7 K/uL   Lymphocytes Relative 7 %   Lymphs Abs 1.0 0.7 - 4.0 K/uL   Monocytes Relative 6 %   Monocytes Absolute 0.9 0.1 - 1.0  K/uL   Eosinophils Relative 0 %   Eosinophils Absolute 0.0 0.0 - 0.5 K/uL   Basophils Relative 0 %   Basophils Absolute 0.0 0.0 - 0.1 K/uL   Immature Granulocytes 0 %   Abs Immature Granulocytes 0.06 0.00 - 0.07 K/uL    Comment: Performed at [facility], [address], [place], [place] [address]  I-Stat Creatinine, ED (not at [facility])     Status: None   Collection Time: 01/17/19  4:37 PM  Result Value Ref Range   Creatinine, Ser 1.20 0.61 - 1.24 mg/dL   Ct Abdomen Pelvis W Contrast  Result Date: 01/17/2019 CLINICAL DATA:  Abdominal pain with appendicitis suspected. EXAM: CT ABDOMEN AND PELVIS WITH CONTRAST TECHNIQUE: Multidetector CT imaging of the abdomen and pelvis was performed using the standard protocol following bolus administration of intravenous contrast. CONTRAST:  116mL OMNIPAQUE IOHEXOL 300 MG/ML  SOLN COMPARISON:  None. FINDINGS: Lower chest: No acute abnormality. Hepatobiliary: No focal liver abnormality is seen. No gallstones, gallbladder wall thickening, or biliary dilatation. Pancreas: Unremarkable. No pancreatic ductal dilatation or surrounding inflammatory changes. Spleen: Normal in size without focal abnormality. Adrenals/Urinary Tract: Adrenal glands are unremarkable. Kidneys are normal, without renal calculi, focal lesion,  or hydronephrosis. Bladder is unremarkable. Stomach/Bowel: There are scattered colonic diverticula without CT evidence of diverticulitis. There is no evidence of a small-bowel obstruction. The appendix is enlarged. There are periappendiceal inflammatory changes. No periappendiceal abscess is identified. Vascular/Lymphatic: Aortic atherosclerosis. No enlarged abdominal or pelvic lymph nodes. Reproductive: Prostate is unremarkable. Other: No abdominal wall hernia or abnormality. No abdominopelvic ascites. Musculoskeletal: IMPRESSION: Acute uncomplicated appendicitis. Electronically Signed   By: Constance Holster M.D.   On: 01/17/2019 18:04    Review of  Systems  Constitutional: Negative for chills, fever and malaise/fatigue.  HENT: Negative for ear discharge, hearing loss and sore throat.   Eyes: Negative for blurred vision and discharge.  Respiratory: Negative for cough and shortness of breath.   Cardiovascular: Negative for chest pain, orthopnea and leg swelling.  Gastrointestinal: Positive for abdominal pain, nausea and vomiting. Negative for constipation, diarrhea and heartburn.  Musculoskeletal: Negative for myalgias and neck pain.  Skin: Negative for itching and rash.  Neurological: Negative for dizziness, focal weakness, seizures and loss of consciousness.  Endo/Heme/Allergies: Negative for environmental allergies. Does not bruise/bleed easily.  Psychiatric/Behavioral: Negative for depression and suicidal ideas.  All other systems reviewed and are negative.   Blood pressure (!) 150/83, pulse 65, temperature (!) 97.5 F (36.4 C), temperature source Oral, resp. rate 13, height 5\' 11"  (1.803 m), weight 117 kg, SpO2 98 %. Physical Exam  Constitutional: He is oriented to person, place, and time. Vital signs are normal. He appears well-developed and well-nourished.  Conversant No acute distress  HENT:  Head: Normocephalic and atraumatic.  Eyes: Pupils are equal, round, and reactive to light. Lids are normal. No scleral icterus.  No lid lag Moist conjunctiva  Neck: No tracheal tenderness present. No thyromegaly present.  No cervical lymphadenopathy  Cardiovascular: Normal rate, regular rhythm and intact distal pulses.  No murmur heard. Respiratory: Effort normal and breath sounds normal. He has no wheezes. He has no rales.  GI: There is no hepatosplenomegaly. There is abdominal tenderness. There is guarding and tenderness at McBurney's point. No hernia.  Neurological: He is alert and oriented to person, place, and time.  Normal gait and station  Skin: Skin is warm. No rash noted. No cyanosis. Nails show no clubbing.  Normal skin  turgor  Psychiatric: Judgment normal.  Appropriate affect     Assessment/Plan 53 year old male with acute appendicitis Hyperlipidemia  1.  We will proceed to the operating for laparoscopic appendectomy. 2. I discussed with the patient the risks benefits of the procedure to include but not limited to: Infection, bleeding, damage to surrounding structures, possible ileus, possible postoperative infection. Patient voiced understanding and wishes to proceed.   Ralene Ok, MD 01/17/2019, 6:31 PM

## 2019-01-17 NOTE — Op Note (Signed)
01/17/2019  9:24 PM  PATIENT:  Ricardo [name]  53 y.o. male  PRE-OPERATIVE DIAGNOSIS:  Acute appendicitis  POST-OPERATIVE DIAGNOSIS:  Acute Appendicitis  PROCEDURE:  Procedure(s): APPENDECTOMY LAPAROSCOPIC (N/A)  SURGEON:  Surgeon(s) and Role:    Ralene Ok, MD - Primary  ANESTHESIA:   local and general  EBL:  minimal   BLOOD ADMINISTERED:none  DRAINS: none   LOCAL MEDICATIONS USED:  BUPIVICAINE   SPECIMEN:  Source of Specimen:  appendix  DISPOSITION OF SPECIMEN:  PATHOLOGY  COUNTS:  YES  TOURNIQUET:  * No tourniquets in log *  DICTATION: .Dragon Dictation Complications: none  Counts: reported as correct x 2  Findings:  The patient had a acutely inflamed non perforated appendix  Specimen: Appendix  Indications for procedure:  The patient is a 53 year old male with a history of periumbilical pain localized in the right lower quadrant patient had a CT scan which revealed signs consistent with acute appendicitis the patient back in for laparoscopic appendectomy.  Details of the procedure:The patient was taken back to the operating room. The patient was placed in supine position with bilateral SCDs in place.  A foley catheter was place. The patient was prepped and draped in the usual sterile fashion.  After appropriate anitbiotics were confirmed, a time-out was confirmed and all facts were verified.    A pneumoperitoneum of 14 mmHg was obtained via a Veress needle technique in the left lower quadrant quadrant.  A 5 mm trocar and 5 mm camera then placed intra-abdominally there is no injury to any intra-abdominal organs a 10 mm infraumbilical port was placed and direct visualization as was a 5 mm port in the suprapubic area.   The appendix was identified and seen to be non-perforated and retrocecal.  There surrounding retropertioneal adheasions were taken down from the lateral abdominal wall. The appendix was cleaned down to the appendiceal base. The mesoappendix was  then incised and the appendiceal artery was cauterized.  The the appendiceal base was clean.  At this time a hemoclip was placed on the base x 2 and 1 distally.  I was not satisfied with the clip completely crossing the base of the appendix and an Endoloop was placed proximallyx2. A retrieval bag was then placed into the abdomen and the specimen placed in the bag. The appendiceal stump was cauterized. There was some adhesions from the colon to the bladder area which did not allow me to view the pelvic sac.The appendix and retrieval  bag was then retrieved via the supraumbilical port. #1 Vicryl was used to reapproximate the fascia at the umbilical port site x2. The skin was reapproximated all port sites 3-0 Monocryl subcuticular fashion. The skin was dressed with Dermabond.  The patient had the foley removed. The patient was awakened from general anesthesia was taken to recovery room in stable condition.      PLAN OF CARE: Admit for overnight observation  PATIENT DISPOSITION:  PACU - hemodynamically stable.   Delay start of Pharmacological VTE agent (>24hrs) due to surgical blood loss or risk of bleeding: not applicable

## 2019-01-17 NOTE — ED Provider Notes (Signed)
[facility] EMERGENCY DEPARTMENT Provider Note   CSN: [id number] Arrival date & time: 01/17/19  1600    History   Chief Complaint Chief Complaint  Patient presents with  . Abdominal Pain    HPI Ricardo [name] is a 53 y.o. male with HLD presenting with severe right sided abdominal pain since this morning. He states he woke up around 3:30 am with a severe cramping pain localized to his right lower abdomen. He states the pain has been constant since then and progressing. He states pain is a 10/10. He endorses nausea and has vomited about 4-5x since this morning. He thought he might have been constipated, he only had one small BM today after straining. He took some peptobismol with no relief. He states he had spicy [place] food two days ago. Denies any sick contacts, recent illnesses, blood in his vomit or stool, fevers, chest pain, or SOB. He denies any prior surgeries. He has no medical history except for a recent diagnosis of HLD for which he was started on rosuvastatin about 6 weeks ago.      HPI  Past Medical History:  Diagnosis Date  . Arthritis     Patient Active Problem List   Diagnosis Date Noted  . Acute appendicitis 01/17/2019  . General weakness 11/10/2018  . Colon cancer screening 11/10/2018  . Body mass index (BMI) of 39.0-39.9 in adult 11/10/2018  . Arthralgia 11/10/2018  . Sleep disorder 11/10/2018  . Weight gain 11/10/2018    History reviewed. No pertinent surgical history.      Home Medications    Prior to Admission medications   Medication Sig Start Date End Date Taking? Authorizing Provider  rosuvastatin (CRESTOR) 10 MG tablet Take 1 tablet (10 mg total) by mouth daily. 11/14/18   [name], MD    Family History Family History  Problem Relation Age of Onset  . Cancer Sister   . Cancer Brother   . High Cholesterol Brother     Social History Social History   Tobacco Use  . Smoking status: Never Smoker  .  Smokeless tobacco: Never Used  Substance Use Topics  . Alcohol use: Never    Frequency: Never    Comment: rare  . Drug use: Never     Allergies   Patient has no known allergies.   Review of Systems Review of Systems  Constitutional: Negative for chills, diaphoresis and fever.  Respiratory: Negative for cough, chest tightness and shortness of breath.   Cardiovascular: Negative for chest pain.  Gastrointestinal: Positive for abdominal pain, nausea and vomiting. Negative for abdominal distention, blood in stool, constipation and diarrhea.  Genitourinary: Negative for difficulty urinating and hematuria.     Physical Exam Updated Vital Signs BP (!) 150/83   Pulse 65   Temp (!) 97.5 F (36.4 C) (Oral)   Resp 13   Ht 5\' 11"  (1.803 m)   Wt 117 kg   SpO2 98%   BMI 35.98 kg/m   Physical Exam Constitutional:      Appearance: He is well-developed. He is obese. He is not diaphoretic.     Comments: Uncomfortable on exam  Cardiovascular:     Rate and Rhythm: Normal rate and regular rhythm.     Heart sounds: Normal heart sounds.  Pulmonary:     Effort: Pulmonary effort is normal. No respiratory distress.     Breath sounds: Normal breath sounds. No wheezing or rales.  Abdominal:     General: Bowel sounds  are decreased. There is no distension.     Palpations: Abdomen is soft. There is no fluid wave.     Tenderness: There is abdominal tenderness in the right lower quadrant. There is no guarding or rebound. Positive signs include McBurney's sign.  Skin:    General: Skin is warm and dry.  Neurological:     Mental Status: He is alert and oriented to person, place, and time.  Psychiatric:        Mood and Affect: Mood normal.        Behavior: Behavior normal.      ED Treatments / Results  Labs (all labs ordered are listed, but only abnormal results are displayed) Labs Reviewed  CBC WITH DIFFERENTIAL/PLATELET - Abnormal; Notable for the following components:      Result Value    WBC 15.6 (*)    Neutro Abs 13.6 (*)    All other components within normal limits  SARS CORONAVIRUS 2 (HOSPITAL ORDER, [facility])  URINALYSIS, ROUTINE W REFLEX MICROSCOPIC  BASIC METABOLIC PANEL  I-STAT CREATININE, ED    EKG None  Radiology Ct Abdomen Pelvis W Contrast  Result Date: 01/17/2019 CLINICAL DATA:  Abdominal pain with appendicitis suspected. EXAM: CT ABDOMEN AND PELVIS WITH CONTRAST TECHNIQUE: Multidetector CT imaging of the abdomen and pelvis was performed using the standard protocol following bolus administration of intravenous contrast. CONTRAST:  123mL OMNIPAQUE IOHEXOL 300 MG/ML  SOLN COMPARISON:  None. FINDINGS: Lower chest: No acute abnormality. Hepatobiliary: No focal liver abnormality is seen. No gallstones, gallbladder wall thickening, or biliary dilatation. Pancreas: Unremarkable. No pancreatic ductal dilatation or surrounding inflammatory changes. Spleen: Normal in size without focal abnormality. Adrenals/Urinary Tract: Adrenal glands are unremarkable. Kidneys are normal, without renal calculi, focal lesion, or hydronephrosis. Bladder is unremarkable. Stomach/Bowel: There are scattered colonic diverticula without CT evidence of diverticulitis. There is no evidence of a small-bowel obstruction. The appendix is enlarged. There are periappendiceal inflammatory changes. No periappendiceal abscess is identified. Vascular/Lymphatic: Aortic atherosclerosis. No enlarged abdominal or pelvic lymph nodes. Reproductive: Prostate is unremarkable. Other: No abdominal wall hernia or abnormality. No abdominopelvic ascites. Musculoskeletal: IMPRESSION: Acute uncomplicated appendicitis. Electronically Signed   By: Constance Holster M.D.   On: 01/17/2019 18:04    Procedures Procedures (including critical care time)  Medications Ordered in ED Medications  ondansetron (ZOFRAN-ODT) disintegrating tablet 8 mg ( Oral MAR Hold 01/17/19 1902)  cefoTEtan (CEFOTAN) 2 g  in sodium chloride 0.9 % 100 mL IVPB (2 g Intravenous Transfusing/Transfer 01/17/19 1855)  ondansetron (ZOFRAN) injection 4 mg (4 mg Intravenous Given 01/17/19 1639)  HYDROmorphone (DILAUDID) injection 1 mg (1 mg Intravenous Given 01/17/19 1639)  iohexol (OMNIPAQUE) 300 MG/ML solution 100 mL (100 mLs Intravenous Contrast Given 01/17/19 1747)  HYDROmorphone (DILAUDID) injection 1 mg (1 mg Intravenous Given 01/17/19 1845)     Initial Impression / Assessment and Plan / ED Course  I have reviewed the triage vital signs and the nursing notes.  Pertinent labs & imaging results that were available during my care of the patient were reviewed by me and considered in my medical decision making (see chart for details).  Clinical Course as of Jan 17 1904  Tue Jan 17, 2019  1650 Urinalysis, Routine w reflex microscopic [AR]    Clinical Course User Index [AR] Marigny Borre N, DO   Pt is a 53 yo male with HLD who presents with acute onset right sided abdominal pain. He states the pain started this morning around 330 am  and has been constant and progressed since. Describes pain as cramping, 10/10. He has vomited 4-5x. No surgical history. He is mildly hypertensive,otherwise hemodynamically stable. On exam, pain was localized to RLQ, no rebound tenderness, decreased bowel sounds. Concerning for acute appendicitis vs renal colic. Will order CT abdomen pelvis.      CBC shows leukocytosis, 15.6. I-stat Cr 1.20.   6:11 pm CT Abdomen Pelvis showed acute uncomplicated appendicitis. Called General Surgery. They will come evaluate the patient. Will admit to general surgery.     Final Clinical Impressions(s) / ED Diagnoses   Final diagnoses:  Acute appendicitis, unspecified acute appendicitis type    ED Discharge Orders    None       Ruhama Lehew N, DO 01/17/19 1905    Lajean Saver, MD 01/18/19 [phone]

## 2019-01-17 NOTE — ED Notes (Signed)
Per OR, pt to be swabbed then brought up. Order placed by EDP.

## 2019-01-17 NOTE — ED Notes (Signed)
ED TO INPATIENT HANDOFF REPORT  ED Nurse Name and Phone #: Jinny Blossom [phone]  S Name/Age/Gender Ricardo [name] 53 y.o. male Room/Bed: 032C/032C  Code Status   Code Status: Not on file  Home/SNF/Other Home Patient oriented to: self, place, time and situation Is this baseline? Yes   Triage Complete: Triage complete  Chief Complaint ABD Pain   Triage Note Pt arrives POV for RLQ abdominal pain since 0330. Pt reports it has been progressing since. Pt reports n/v with 5 episodes of emesis. Last BM yesterday. Pt denies urinary symptoms.    Allergies No Known Allergies  Level of Care/Admitting Diagnosis ED Disposition    ED Disposition Condition [facility] Area: [facility] [[id number]]  Level of Care: Med-Surg [16]  Covid Evaluation: Screening Protocol (No Symptoms)  Diagnosis: Acute appendicitis [240973]  Admitting Physician: CCS, [place]  Attending Physician: CCS, MD [[id number]]  Bed request comments: 6N  PT Class (Do Not Modify): Observation [104]  PT Acc Code (Do Not Modify): Observation [10022]       B Medical/Surgery History Past Medical History:  Diagnosis Date  . Arthritis    History reviewed. No pertinent surgical history.   A IV Location/Drains/Wounds Patient Lines/Drains/Airways Status   Active Line/Drains/Airways    Name:   Placement date:   Placement time:   Site:   Days:   Peripheral IV 01/17/19 Left Antecubital   01/17/19    1612    Antecubital   less than 1          Intake/Output Last 24 hours No intake or output data in the 24 hours ending 01/17/19 1844  Labs/Imaging Results for orders placed or performed during the hospital encounter of 01/17/19 (from the past 48 hour(s))  CBC with Differential     Status: Abnormal   Collection Time: 01/17/19  4:26 PM  Result Value Ref Range   WBC 15.6 (H) 4.0 - 10.5 K/uL   RBC 5.21 4.22 - 5.81 MIL/uL   Hemoglobin 15.9 13.0 - 17.0 g/dL   HCT 46.0 39.0 - 52.0 %   MCV 88.3 80.0  - 100.0 fL   MCH 30.5 26.0 - 34.0 pg   MCHC 34.6 30.0 - 36.0 g/dL   RDW 13.0 11.5 - 15.5 %   Platelets 261 150 - 400 K/uL   nRBC 0.0 0.0 - 0.2 %   Neutrophils Relative % 87 %   Neutro Abs 13.6 (H) 1.7 - 7.7 K/uL   Lymphocytes Relative 7 %   Lymphs Abs 1.0 0.7 - 4.0 K/uL   Monocytes Relative 6 %   Monocytes Absolute 0.9 0.1 - 1.0 K/uL   Eosinophils Relative 0 %   Eosinophils Absolute 0.0 0.0 - 0.5 K/uL   Basophils Relative 0 %   Basophils Absolute 0.0 0.0 - 0.1 K/uL   Immature Granulocytes 0 %   Abs Immature Granulocytes 0.06 0.00 - 0.07 K/uL    Comment: Performed at [facility], [address], [place],  [address]  I-Stat Creatinine, ED (not at [facility])     Status: None   Collection Time: 01/17/19  4:37 PM  Result Value Ref Range   Creatinine, Ser 1.20 0.61 - 1.24 mg/dL   Ct Abdomen Pelvis W Contrast  Result Date: 01/17/2019 CLINICAL DATA:  Abdominal pain with appendicitis suspected. EXAM: CT ABDOMEN AND PELVIS WITH CONTRAST TECHNIQUE: Multidetector CT imaging of the abdomen and pelvis was performed using the standard protocol following bolus administration of intravenous  contrast. CONTRAST:  172mL OMNIPAQUE IOHEXOL 300 MG/ML  SOLN COMPARISON:  None. FINDINGS: Lower chest: No acute abnormality. Hepatobiliary: No focal liver abnormality is seen. No gallstones, gallbladder wall thickening, or biliary dilatation. Pancreas: Unremarkable. No pancreatic ductal dilatation or surrounding inflammatory changes. Spleen: Normal in size without focal abnormality. Adrenals/Urinary Tract: Adrenal glands are unremarkable. Kidneys are normal, without renal calculi, focal lesion, or hydronephrosis. Bladder is unremarkable. Stomach/Bowel: There are scattered colonic diverticula without CT evidence of diverticulitis. There is no evidence of a small-bowel obstruction. The appendix is enlarged. There are periappendiceal inflammatory changes. No periappendiceal abscess is identified. Vascular/Lymphatic:  Aortic atherosclerosis. No enlarged abdominal or pelvic lymph nodes. Reproductive: Prostate is unremarkable. Other: No abdominal wall hernia or abnormality. No abdominopelvic ascites. Musculoskeletal: IMPRESSION: Acute uncomplicated appendicitis. Electronically Signed   By: Constance Holster M.D.   On: 01/17/2019 18:04    Pending Labs Unresulted Labs (From admission, onward)    Start     Ordered   01/17/19 1830  SARS Coronavirus 2 (CEPHEID - Performed in [facility] lab), Hosp Order  (Asymptomatic Patients Labs)  Once,   R    Question:  Rule Out  Answer:  Yes   01/17/19 1829   01/17/19 4818  Basic metabolic panel  Once,   STAT     01/17/19 1627   01/17/19 1625  Urinalysis, Routine w reflex microscopic  ONCE - STAT,   STAT     01/17/19 1627          Vitals/Pain Today's Vitals   01/17/19 1730 01/17/19 1734 01/17/19 1815 01/17/19 1823  BP: 139/82  (!) 150/83   Pulse: 68  65   Resp: 18  13   Temp:      TempSrc:      SpO2: 96%  98%   Weight:      Height:      PainSc:  7   7     Isolation Precautions No active isolations  Medications Medications  ondansetron (ZOFRAN-ODT) disintegrating tablet 8 mg (8 mg Oral Not Given 01/17/19 1639)  cefoTEtan (CEFOTAN) 2 g in sodium chloride 0.9 % 100 mL IVPB (has no administration in time range)  HYDROmorphone (DILAUDID) injection 1 mg (has no administration in time range)  ondansetron (ZOFRAN) injection 4 mg (4 mg Intravenous Given 01/17/19 1639)  HYDROmorphone (DILAUDID) injection 1 mg (1 mg Intravenous Given 01/17/19 1639)  iohexol (OMNIPAQUE) 300 MG/ML solution 100 mL (100 mLs Intravenous Contrast Given 01/17/19 1747)    Mobility walks Low fall risk   Focused Assessments gastrointestinal   R Recommendations: See Admitting Provider Note  Report given to:   Additional Notes:

## 2019-01-17 NOTE — Anesthesia Postprocedure Evaluation (Signed)
Anesthesia Post Note  Patient: Ricardo [name]  Procedure(s) Performed: APPENDECTOMY LAPAROSCOPIC (N/A Abdomen)     Patient location during evaluation: PACU Anesthesia Type: General Level of consciousness: awake and alert Pain management: pain level controlled Vital Signs Assessment: post-procedure vital signs reviewed and stable Respiratory status: spontaneous breathing, nonlabored ventilation, respiratory function stable and patient connected to nasal cannula oxygen Cardiovascular status: blood pressure returned to baseline and stable Postop Assessment: no apparent nausea or vomiting Anesthetic complications: no    Last Vitals:  Vitals:   01/17/19 2140 01/17/19 2155  BP: 136/60 134/82  Pulse: 94 94  Resp: 15 20  Temp: (!) 36.3 C   SpO2: 92% 98%    Last Pain:  Vitals:   01/17/19 2155  TempSrc:   PainSc: 0-No pain                 Audry Pili

## 2019-01-17 NOTE — Telephone Encounter (Signed)
Pt called stating that he woke about 3:30 this am with a severe cramping to his abdomin Rt side. He rates the cramping as sudden and 9.5 on the pain scale. He has vomited 4-5 times. He feels constipated but only produced a small BM today after a lot of straining. He took some Pepto bismol without relief. Per protocol pt will go to the ER for evaluation of his symptoms. Care advice read to patient. Pt verbalized understanding of all instructions.  Reason for Disposition . [1] SEVERE pain (e.g., excruciating) AND [2] present > 1 hour  Answer Assessment - Initial Assessment Questions 1. LOCATION: "Where does it hurt?"      Rt side 2. RADIATION: "Does the pain shoot anywhere else?" (e.g., chest, back)     no 3. ONSET: "When did the pain begin?" (Minutes, hours or days ago)      3am this moring 4. SUDDEN: "Gradual or sudden onset?"     sudden 5. PATTERN "Does the pain come and go, or is it constant?"    - If constant: "Is it getting better, staying the same, or worsening?"      (Note: Constant means the pain never goes away completely; most serious pain is constant and it progresses)     - If intermittent: "How long does it last?" "Do you have pain now?"     (Note: Intermittent means the pain goes away completely between bouts)    constant 6. SEVERITY: "How bad is the pain?"  (e.g., Scale 1-10; mild, moderate, or severe)    - MILD (1-3): doesn't interfere with normal activities, abdomen soft and not tender to touch     - MODERATE (4-7): interferes with normal activities or awakens from sleep, tender to touch     - SEVERE (8-10): excruciating pain, doubled over, unable to do any normal activities       9.5 7. RECURRENT SYMPTOM: "Have you ever had this type of abdominal pain before?" If so, ask: "When was the last time?" and "What happened that time?"     no 8. CAUSE: "What do you think is causing the abdominal pain?"    constipation 9. RELIEVING/AGGRAVATING FACTORS: "What makes it better or  worse?" (e.g., movement, antacids, bowel movement)     no 10. OTHER SYMPTOMS: "Has there been any vomiting, diarrhea, constipation, or urine problems?"      vomiting  Protocols used: ABDOMINAL PAIN - MALE-A-AH

## 2019-01-17 NOTE — ED Notes (Signed)
Pharmacy messaged about unverified meds 

## 2019-01-18 ENCOUNTER — Encounter (HOSPITAL_COMMUNITY): Payer: Self-pay | Admitting: General Surgery

## 2019-01-18 MED ORDER — DOCUSATE SODIUM 100 MG PO CAPS: 100.0000 mg | ORAL_CAPSULE | Freq: Two times a day (BID) | ORAL | 0 refills | Status: DC

## 2019-01-18 MED ORDER — OXYCODONE HCL 5 MG PO TABS: 5.0000 mg | ORAL_TABLET | Freq: Four times a day (QID) | ORAL | 0 refills | Status: DC | PRN

## 2019-01-18 MED ORDER — POLYETHYLENE GLYCOL 3350 17 G PO PACK: 17.0000 g | PACK | Freq: Every day | ORAL | 0 refills | Status: DC

## 2019-01-18 MED ORDER — POLYETHYLENE GLYCOL 3350 17 G PO PACK
17.0000 g | PACK | Freq: Every day | ORAL | Status: DC
  Administered 2019-01-18: 17 g via ORAL
  Filled 2019-01-18: qty 1

## 2019-01-18 MED ORDER — DOCUSATE SODIUM 100 MG PO CAPS
100.0000 mg | ORAL_CAPSULE | Freq: Two times a day (BID) | ORAL | Status: DC
  Administered 2019-01-18: 100 mg via ORAL
  Filled 2019-01-18: qty 1

## 2019-01-18 MED ORDER — ENOXAPARIN SODIUM 40 MG/0.4ML ~~LOC~~ SOLN
40.0000 mg | SUBCUTANEOUS | Status: DC
  Administered 2019-01-18: 40 mg via SUBCUTANEOUS
  Filled 2019-01-18: qty 0.4

## 2019-01-18 NOTE — Plan of Care (Signed)
  Problem: Skin Integrity: Goal: Demonstration of wound healing without infection will improve Outcome: Progressing   

## 2019-01-18 NOTE — Plan of Care (Signed)
  Problem: Clinical Measurements: Goal: Postoperative complications will be avoided or minimized 01/18/2019 1401 by Byrd Hesselbach, Melrose Nakayama, RN Outcome: Progressing 01/18/2019 1400 by Byrd Hesselbach, [place] Nakayama, RN Outcome: Progressing   Problem: Skin Integrity: Goal: Demonstration of wound healing without infection will improve Outcome: Progressing

## 2019-01-18 NOTE — Discharge Summary (Signed)
[facility] Discharge Summary   Patient ID: Ricardo [name] MRN: [id number] DOB/AGE: Oct 09, 1965 53 y.o.  Admit date: 01/17/2019 Discharge date: 01/18/2019  Admitting Diagnosis: Acute appendicitis  Discharge Diagnosis Patient Active Problem List   Diagnosis Date Noted  . Acute appendicitis 01/17/2019  . General weakness 11/10/2018  . Colon cancer screening 11/10/2018  . Body mass index (BMI) of 39.0-39.9 in adult 11/10/2018  . Arthralgia 11/10/2018  . Sleep disorder 11/10/2018  . Weight gain 11/10/2018    Consultants None  Imaging: Ct Abdomen Pelvis W Contrast  Result Date: 01/17/2019 CLINICAL DATA:  Abdominal pain with appendicitis suspected. EXAM: CT ABDOMEN AND PELVIS WITH CONTRAST TECHNIQUE: Multidetector CT imaging of the abdomen and pelvis was performed using the standard protocol following bolus administration of intravenous contrast. CONTRAST:  134mL OMNIPAQUE IOHEXOL 300 MG/ML  SOLN COMPARISON:  None. FINDINGS: Lower chest: No acute abnormality. Hepatobiliary: No focal liver abnormality is seen. No gallstones, gallbladder wall thickening, or biliary dilatation. Pancreas: Unremarkable. No pancreatic ductal dilatation or surrounding inflammatory changes. Spleen: Normal in size without focal abnormality. Adrenals/Urinary Tract: Adrenal glands are unremarkable. Kidneys are normal, without renal calculi, focal lesion, or hydronephrosis. Bladder is unremarkable. Stomach/Bowel: There are scattered colonic diverticula without CT evidence of diverticulitis. There is no evidence of a small-bowel obstruction. The appendix is enlarged. There are periappendiceal inflammatory changes. No periappendiceal abscess is identified. Vascular/Lymphatic: Aortic atherosclerosis. No enlarged abdominal or pelvic lymph nodes. Reproductive: Prostate is unremarkable. Other: No abdominal wall hernia or abnormality. No abdominopelvic ascites. Musculoskeletal: IMPRESSION: Acute uncomplicated  appendicitis. Electronically Signed   By: Constance Holster M.D.   On: 01/17/2019 18:04    Procedures Dr. Rosendo Gros (01/17/19) - Laparoscopic Appendectomy  Hospital Course:  Ricardo [name] is a 53yo male who presented to [facility] 5/5 with acute onset abdominal pain.  Workup showed acute appendicitis.  Patient was admitted and underwent procedure listed above.  Tolerated procedure well and was transferred to the floor.  Diet was advanced as tolerated.  On POD1, the patient was voiding well, tolerating diet, ambulating well, pain well controlled, vital signs stable, incisions c/d/i and felt stable for discharge home.  Patient will follow up as below and knows to call with questions or concerns.     Allergies as of 01/18/2019   No Known Allergies     Medication List    TAKE these medications   docusate sodium 100 MG capsule Commonly known as:  COLACE Take 1 capsule (100 mg total) by mouth 2 (two) times daily.   ibuprofen 200 MG tablet Commonly known as:  ADVIL Take 400 mg by mouth every 6 (six) hours as needed (for arthritis pain).   oxyCODONE 5 MG immediate release tablet Commonly known as:  Oxy IR/ROXICODONE Take 1 tablet (5 mg total) by mouth every 6 (six) hours as needed for severe pain.   polyethylene glycol 17 g packet Commonly known as:  MIRALAX / GLYCOLAX Take 17 g by mouth daily. Start taking on:  Jan 19, 2019   rosuvastatin 10 MG tablet Commonly known as:  Crestor Take 1 tablet (10 mg total) by mouth daily.        Follow-up [facility], [place]. Call on 01/31/2019.   Specialty:  General Surgery Why:  5/19 at 11am. Due to coronavirus we are decreasing foot traffic in office. Instead of coming to an appt a provider will call you at the above date/time. Send picture of your incision with your name and  date of birth to [email or web address] Contact information: [address] [place]  [place] [phone]          Signed: Wellington Hampshire, [facility] 01/18/2019, 1:39 PM Pager: [phone] Mon-Thurs 7:00 am-4:30 pm Fri 7:00 am -11:30 AM Sat-Sun 7:00 am-11:30 am

## 2019-01-18 NOTE — Discharge Instructions (Signed)
[facility]  LAPAROSCOPIC SURGERY: POST OP INSTRUCTIONS Always review your discharge instruction sheet given to you by the facility where your surgery was performed. IF YOU HAVE DISABILITY OR FAMILY LEAVE FORMS, YOU MUST BRING THEM TO THE OFFICE FOR PROCESSING.   DO NOT GIVE THEM TO YOUR DOCTOR.  PAIN CONTROL  1. First take acetaminophen (Tylenol) AND/or ibuprofen (Advil) to control your pain after surgery.  Follow directions on package.  Taking acetaminophen (Tylenol) and/or ibuprofen (Advil) regularly after surgery will help to control your pain and lower the amount of prescription pain medication you may need.  You should not take more than 4,000 mg (4 grams) of acetaminophen (Tylenol) in 24 hours.  You should not take ibuprofen (Advil), aleve, motrin, naprosyn or other NSAIDS if you have a history of stomach ulcers or chronic kidney disease.  2. A prescription for pain medication may be given to you upon discharge.  Take your pain medication as prescribed, if you still have uncontrolled pain after taking acetaminophen (Tylenol) or ibuprofen (Advil). 3. Use ice packs to help control pain. 4. If you need a refill on your pain medication, please contact your pharmacy.  They will contact our office to request authorization. Prescriptions will not be filled after 5pm or on week-ends.  HOME MEDICATIONS 5. Take your usually prescribed medications unless otherwise directed.  DIET 6. You should follow a light diet the first few days after arrival home.  Be sure to include lots of fluids daily. Avoid fatty, fried foods.   CONSTIPATION 7. It is common to experience some constipation after surgery and if you are taking pain medication.  Increasing fluid intake and taking a stool softener (such as Colace) will usually help or prevent this problem from occurring.  A mild laxative (Milk of Magnesia or Miralax) should be taken according to package instructions if there are no bowel  movements after 48 hours.  WOUND/INCISION CARE 8. Most patients will experience some swelling and bruising in the area of the incisions.  Ice packs will help.  Swelling and bruising can take several days to resolve.  9. Unless discharge instructions indicate otherwise, follow guidelines below  a. STERI-STRIPS - you may remove your outer bandages 48 hours after surgery, and you may shower at that time.  You have steri-strips (small skin tapes) in place directly over the incision.  These strips should be left on the skin for 7-10 days.   b. DERMABOND/SKIN GLUE - you may shower in 24 hours.  The glue will flake off over the next 2-3 weeks. 10. Any sutures or staples will be removed at the office during your follow-up visit.  ACTIVITIES 11. You may resume regular (light) daily activities beginning the next day--such as daily self-care, walking, climbing stairs--gradually increasing activities as tolerated.  You may have sexual intercourse when it is comfortable.  Refrain from any heavy lifting or straining until approved by your doctor. a. You may drive when you are no longer taking prescription pain medication, you can comfortably wear a seatbelt, and you can safely maneuver your car and apply brakes.  FOLLOW-UP 12. You should see your doctor in the office for a follow-up appointment approximately 2-3 weeks after your surgery.  You should have been given your post-op/follow-up appointment when your surgery was scheduled.  If you did not receive a post-op/follow-up appointment, make sure that you call for this appointment within a day or two after you arrive home to insure a convenient appointment time.  OTHER INSTRUCTIONS  WHEN TO CALL YOUR DOCTOR: 1. Fever over 101.0 2. Inability to urinate 3. Continued bleeding from incision. 4. Increased pain, redness, or drainage from the incision. 5. Increasing abdominal pain  The clinic staff is available to answer your questions during regular business  hours.  Please dont hesitate to call and ask to speak to one of the nurses for clinical concerns.  If you have a medical emergency, go to the nearest emergency room or call 911.  A surgeon from [facility] is always on call at the hospital. [address], [place], [place], [place]  [address] ? [address], [place], [place]   [address] [phone] ? [phone] ? FAX [phone]

## 2019-01-18 NOTE — Progress Notes (Signed)
[facility] Progress Note  1 Day Post-Op  Subjective: CC-  Doing ok this morning. Pain improved from prior to surgery but states that he feels bloated and needs to have a BM. Burping. No flatus or BM. Denies n/v. Tolerated breakfast. Urinated without issues. Ambulated to restroom independently.   Objective: Vital signs in last 24 hours: Temp:  [97.2 F (36.2 C)-98.7 F (37.1 C)] 98.6 F (37 C) (05/06 0514) Pulse Rate:  [64-94] 86 (05/06 0514) Resp:  [13-21] 18 (05/06 0514) BP: (111-150)/(57-96) 123/73 (05/06 0514) SpO2:  [92 %-99 %] 95 % (05/06 0514) Weight:  [749 kg-120.2 kg] 120.2 kg (05/05 2255) Last BM Date: 01/17/19  Intake/Output from previous day: 05/05 0701 - 05/06 0700 In: 1820.1 [P.O.:360; I.V.:1460.1] Out: 570 [Urine:550; Blood:20] Intake/Output this shift: No intake/output data recorded.  PE: Gen:  Alert, NAD, pleasant HEENT: EOM's intact, pupils equal and round Pulm:  effort normal Abd: Soft, mild distension, appropriately tender, +BS, lap incisions C/D/I Psych: A&Ox3  Skin: no rashes noted, warm and dry  Lab Results:  Recent Labs    01/17/19 1626  WBC 15.6*  HGB 15.9  HCT 46.0  PLT 261   BMET Recent Labs    01/17/19 1626 01/17/19 1637  NA 142  --   K 4.3  --   CL 110  --   CO2 24  --   GLUCOSE 105*  --   BUN 12  --   CREATININE 1.24 1.20  CALCIUM 9.5  --    PT/INR No results for input(s): LABPROT, INR in the last 72 hours. CMP     Component Value Date/Time   NA 142 01/17/2019 1626   NA 139 11/10/2018 1723   K 4.3 01/17/2019 1626   CL 110 01/17/2019 1626   CO2 24 01/17/2019 1626   GLUCOSE 105 (H) 01/17/2019 1626   BUN 12 01/17/2019 1626   BUN 15 11/10/2018 1723   CREATININE 1.20 01/17/2019 1637   CALCIUM 9.5 01/17/2019 1626   PROT 7.4 11/10/2018 1723   ALBUMIN 4.6 11/10/2018 1723   AST 18 11/10/2018 1723   ALT 25 11/10/2018 1723   ALKPHOS 101 11/10/2018 1723   BILITOT 0.3 11/10/2018 1723   GFRNONAA >60  01/17/2019 1626   GFRAA >60 01/17/2019 1626   Lipase     Component Value Date/Time   LIPASE 26 01/20/2009 1802       Studies/Results: Ct Abdomen Pelvis W Contrast  Result Date: 01/17/2019 CLINICAL DATA:  Abdominal pain with appendicitis suspected. EXAM: CT ABDOMEN AND PELVIS WITH CONTRAST TECHNIQUE: Multidetector CT imaging of the abdomen and pelvis was performed using the standard protocol following bolus administration of intravenous contrast. CONTRAST:  140mL OMNIPAQUE IOHEXOL 300 MG/ML  SOLN COMPARISON:  None. FINDINGS: Lower chest: No acute abnormality. Hepatobiliary: No focal liver abnormality is seen. No gallstones, gallbladder wall thickening, or biliary dilatation. Pancreas: Unremarkable. No pancreatic ductal dilatation or surrounding inflammatory changes. Spleen: Normal in size without focal abnormality. Adrenals/Urinary Tract: Adrenal glands are unremarkable. Kidneys are normal, without renal calculi, focal lesion, or hydronephrosis. Bladder is unremarkable. Stomach/Bowel: There are scattered colonic diverticula without CT evidence of diverticulitis. There is no evidence of a small-bowel obstruction. The appendix is enlarged. There are periappendiceal inflammatory changes. No periappendiceal abscess is identified. Vascular/Lymphatic: Aortic atherosclerosis. No enlarged abdominal or pelvic lymph nodes. Reproductive: Prostate is unremarkable. Other: No abdominal wall hernia or abnormality. No abdominopelvic ascites. Musculoskeletal: IMPRESSION: Acute uncomplicated appendicitis. Electronically Signed   By: Jamie Kato.D.  On: 01/17/2019 18:04    Anti-infectives: Anti-infectives (From admission, onward)   Start     Dose/Rate Route Frequency Ordered Stop   01/17/19 1830  cefoTEtan (CEFOTAN) 2 g in sodium chloride 0.9 % 100 mL IVPB     2 g 200 mL/hr over 30 Minutes Intravenous  Once 01/17/19 1816 01/17/19 1921       Assessment/Plan Acute appendicitis S/p laparoscopic  appendectomy 5/5 Dr. Rosendo Gros - POD #1 - pain controlled, tolerating diet but somewhat bloated/burping/uncomfortable  ID - cefotan periop FEN - reg diet VTE - scds, lovenox Foley - out Follow up - DOW  Plan - Give miralax. Ambulate. Will recheck later today for possible discharge.   LOS: 0 days    Wellington Hampshire , [facility] 01/18/2019, 8:25 AM Pager: [phone] Mon-Thurs 7:00 am-4:30 pm Fri 7:00 am -11:30 AM Sat-Sun 7:00 am-11:30 am

## 2019-03-07 ENCOUNTER — Ambulatory Visit (INDEPENDENT_AMBULATORY_CARE_PROVIDER_SITE_OTHER): Payer: BC Managed Care – PPO | Admitting: Orthopaedic Surgery

## 2019-03-07 ENCOUNTER — Other Ambulatory Visit: Payer: Self-pay

## 2019-03-07 ENCOUNTER — Encounter: Payer: Self-pay | Admitting: Orthopaedic Surgery

## 2019-03-07 ENCOUNTER — Ambulatory Visit: Payer: Self-pay

## 2019-03-07 DIAGNOSIS — M25511 Pain in right shoulder: Secondary | ICD-10-CM | POA: Insufficient documentation

## 2019-03-07 MED ORDER — METHYLPREDNISOLONE 4 MG PO TABS: ORAL_TABLET | ORAL | 0 refills | Status: DC

## 2019-03-07 MED ORDER — LIDOCAINE HCL 1 % IJ SOLN
3.0000 mL | INTRAMUSCULAR | Status: AC | PRN
  Administered 2019-03-07: 3 mL

## 2019-03-07 MED ORDER — METHYLPREDNISOLONE ACETATE 40 MG/ML IJ SUSP
40.0000 mg | INTRAMUSCULAR | Status: AC | PRN
  Administered 2019-03-07: 40 mg via INTRA_ARTICULAR

## 2019-03-07 NOTE — Progress Notes (Signed)
Office Visit Note   Patient: Ricardo [name]           Date of Birth: 13-Oct-1965           MRN: [id number] Visit Date: 03/07/2019              Requested by: [name], MD [place],  [place] [address] PCP: [name], MD   Assessment & Plan: Visit Diagnoses:  1. Right shoulder pain, unspecified chronicity   2. Acute pain of right shoulder     Plan: I spoke with him about trying a steroid injection in the right shoulder subacromial space and he agreed with this.  I explained the risk and benefits of injections and he tolerated it well.  He did feel some relief of his pain after the injection.  I am encouraged him to get the shoulder moving but no heavy lifting and no push-ups and no throwing football.  I would like to reevaluate him in 2 weeks.  I am also going to put him on a 6-day steroid taper.  All question concerns were answered and addressed.  Follow-Up Instructions: Return in about 2 weeks (around 03/21/2019).   Orders:  Orders Placed This Encounter  Procedures  . Large Joint Inj  . XR Shoulder Right   Meds ordered this encounter  Medications  . methylPREDNISolone (MEDROL) 4 MG tablet    Sig: Medrol dose pack. Take as instructed    Dispense:  21 tablet    Refill:  0      Procedures: Large Joint Inj: R subacromial bursa on 03/07/2019 9:03 AM Indications: pain and diagnostic evaluation Details: 22 G 1.5 in needle  Arthrogram: No  Medications: 3 mL lidocaine 1 %; 40 mg methylPREDNISolone acetate 40 MG/ML Outcome: tolerated well, no immediate complications Procedure, treatment alternatives, risks and benefits explained, specific risks discussed. Consent was given by the patient. Immediately prior to procedure a time out was called to verify the correct patient, procedure, equipment, support staff and site/side marked as required. Patient was prepped and draped in the usual  sterile fashion.       Clinical Data: No additional findings.   Subjective: Chief Complaint  Patient presents with  . Right Shoulder - Pain  Patient comes in today with acute right shoulder pain.  He is a Careers adviser.  He had not had shoulder problems until recently when he underwent an emergent appendectomy.  He woke up from surgery with right shoulder pain and is gotten significantly worse since then.  Most likely had some underlying impingement syndrome that can worsen when arms are positioned in the operating room and this can be subtle but then can cause a cascade of events worsening impingement syndrome.  I explained this to him in detail.  He has not injured this shoulder before.  He has had a previous left shoulder injury.  He is right-hand dominant.  He says is hard reaching overhead and reaching behind him.  He was trying to push through the pain but then he took last week off not using his shoulder as much and not throwing a football.  He has become more stiff and continues to be painful.  He does take 3 ibuprofen 3 times a day and he says that helped  the most.  HPI  Review of Systems He currently denies any headache, chest pain, shortness of breath, fever, chills, nausea, vomiting  Objective: Vital Signs: There were no vitals taken for this visit.  Physical Exam He is alert and orient x3 and in no acute distress Ortho Exam Examination of his right shoulder does show decreased internal rotation with adduction but his rotator cuff is strong.  He does not use his deltoid abduct his shoulder and his range of motion is almost full except for his internal rotation with adduction.  His liftoff is negative. Specialty Comments:  No specialty comments available.  Imaging: Xr Shoulder Right  Result Date: 03/07/2019 3 views of the right shoulder show well located shoulder with no acute findings.    PMFS History: Patient Active Problem List   Diagnosis Date Noted  . Acute  pain of right shoulder 03/07/2019  . Acute appendicitis 01/17/2019  . General weakness 11/10/2018  . Colon cancer screening 11/10/2018  . Body mass index (BMI) of 39.0-39.9 in adult 11/10/2018  . Arthralgia 11/10/2018  . Sleep disorder 11/10/2018  . Weight gain 11/10/2018   Past Medical History:  Diagnosis Date  . Arthritis   . Family history of adverse reaction to anesthesia    mother had problem with it due to her asthma    Family History  Problem Relation Age of Onset  . Cancer Sister   . Cancer Brother   . High Cholesterol Brother     Past Surgical History:  Procedure Laterality Date  . LAPAROSCOPIC APPENDECTOMY N/A 01/17/2019   Procedure: APPENDECTOMY LAPAROSCOPIC;  Surgeon: Ralene Ok, MD;  Location: [facility];  Service: General;  Laterality: N/A;  . SHOULDER SURGERY Left 09/19/2015   Social History   Occupational History  . Not on file  Tobacco Use  . Smoking status: Never Smoker  . Smokeless tobacco: Never Used  Substance and Sexual Activity  . Alcohol use: Never    Frequency: Never    Comment: rare  . Drug use: Never  . Sexual activity: Not on file

## 2019-03-21 ENCOUNTER — Other Ambulatory Visit: Payer: Self-pay

## 2019-03-21 ENCOUNTER — Ambulatory Visit (INDEPENDENT_AMBULATORY_CARE_PROVIDER_SITE_OTHER): Payer: BC Managed Care – PPO | Admitting: Orthopaedic Surgery

## 2019-03-21 ENCOUNTER — Encounter: Payer: Self-pay | Admitting: Orthopaedic Surgery

## 2019-03-21 DIAGNOSIS — M25511 Pain in right shoulder: Secondary | ICD-10-CM | POA: Diagnosis not present

## 2019-03-21 NOTE — Progress Notes (Signed)
Office Visit Note   Patient: Ricardo [name]           Date of Birth: [birth year]           MRN: [id number] Visit Date: 03/21/2019              Requested by: [name], MD [place],  [place] [address] PCP: [name], MD   Assessment & Plan: Visit Diagnoses:  1. Right shoulder pain, unspecified chronicity     Plan:  He is given some exercise handouts that he can do on his own at home.  Did offer formal physical therapy defers due to the COVID-19 pandemic.  Having continue his ibuprofen.  This pain persist becomes worse especially over the next 2 weeks he will call our office and always order an MRI to rule out cuff tear.  Otherwise he will follow-up with [place] on as-needed basis.  Questions encouraged and answered at length.  He is happy with this plan.  Follow-Up Instructions: Return if symptoms worsen or fail to improve.   Orders:  No orders of the defined types were placed in this encounter.  No orders of the defined types were placed in this encounter.     Procedures: No procedures performed   Clinical Data: No additional findings.   Subjective: Chief Complaint  Patient presents with  . Right Shoulder - Follow-up    HPI Mr. Mentor returns today 2 weeks status post subacromial injection right shoulder.  He states that the shoulder pain is approximately 50% better.  He denies any radicular symptoms down the arm.  He will has pain with extremes of external overhead activity.  However he states his been overgrowing his right arm above his head now after holding therapy which could not do before.  He did finish the Medrol Dosepak and begin taking ibuprofen.  He has been doing some wall crawls and pendulum exercises at home.  Review of Systems See HPI otherwise negative or noncontributory.  Objective: Vital Signs: There were no vitals taken for this visit.  Physical Exam Constitutional:      Appearance: He is not ill-appearing or  diaphoretic.  Pulmonary:     Effort: Pulmonary effort is normal.  Neurological:     Mental Status: He is alert and oriented to person, place, and time.     Ortho Exam Right shoulder full forward flexion.  5 out of 5 strength with external and internal rotation against resistance.  Positive impingement testing right shoulder.  Negative empty can test bilaterally.  No tenderness medial border of the right scapula.  Tenderness in the right bicipital groove region of the shoulder.  Specialty Comments:  No specialty comments available.  Imaging: No results found.   PMFS History: Patient Active Problem List   Diagnosis Date Noted  . Acute pain of right shoulder 03/07/2019  . Acute appendicitis 01/17/2019  . General weakness 11/10/2018  . Colon cancer screening 11/10/2018  . Body mass index (BMI) of 39.0-39.9 in adult 11/10/2018  . Arthralgia 11/10/2018  . Sleep disorder 11/10/2018  . Weight gain 11/10/2018   Past Medical History:  Diagnosis Date  . Arthritis   . Family history of adverse reaction to anesthesia    mother had problem with it due to her asthma    Family History  Problem Relation Age of Onset  . Cancer Sister   . Cancer Brother   . High Cholesterol Brother     Past Surgical History:  Procedure Laterality Date  . LAPAROSCOPIC APPENDECTOMY N/A 01/17/2019   Procedure: APPENDECTOMY LAPAROSCOPIC;  Surgeon: Ralene Ok, MD;  Location: [facility];  Service: General;  Laterality: N/A;  . SHOULDER SURGERY Left 09/19/2015   Social History   Occupational History  . Not on file  Tobacco Use  . Smoking status: Never Smoker  . Smokeless tobacco: Never Used  Substance and Sexual Activity  . Alcohol use: Never    Frequency: Never    Comment: rare  . Drug use: Never  . Sexual activity: Not on file

## 2019-11-09 ENCOUNTER — Ambulatory Visit: Payer: BC Managed Care – PPO | Attending: Internal Medicine

## 2019-11-09 DIAGNOSIS — Z23 Encounter for immunization: Secondary | ICD-10-CM | POA: Insufficient documentation

## 2019-11-09 NOTE — Progress Notes (Signed)
   Covid-19 Vaccination Clinic  Name:  MELTON FINERAN    MRN: [id number] DOB: 06/28/1966  11/09/2019  Mr. Bontrager was observed post Covid-19 immunization for 15 minutes without incidence. He was provided with Vaccine Information Sheet and instruction to access the V-Safe system.   Mr. Kintner was instructed to call 911 with any severe reactions post vaccine: Marland Kitchen Difficulty breathing  . Swelling of your face and throat  . A fast heartbeat  . A bad rash all over your body  . Dizziness and weakness    Immunizations Administered    Name Date Dose VIS Date Route   Pfizer COVID-19 Vaccine 11/09/2019 12:17 PM 0.3 mL 08/25/2019 Intramuscular   Manufacturer: [place]   Lot: [id number]   [place]: [id number]

## 2019-11-29 ENCOUNTER — Ambulatory Visit: Payer: BC Managed Care – PPO | Attending: Internal Medicine

## 2019-11-29 DIAGNOSIS — Z23 Encounter for immunization: Secondary | ICD-10-CM

## 2019-11-29 NOTE — Progress Notes (Signed)
   Covid-19 Vaccination Clinic  Name:  YAREL DILDY    MRN: [id number] DOB: 06-14-1966  11/29/2019  Mr. Hoyos was observed post Covid-19 immunization for 15 minutes without incident. He was provided with Vaccine Information Sheet and instruction to access the V-Safe system.   Mr. Crumpley was instructed to call 911 with any severe reactions post vaccine: Marland Kitchen Difficulty breathing  . Swelling of face and throat  . A fast heartbeat  . A bad rash all over body  . Dizziness and weakness   Immunizations Administered    Name Date Dose VIS Date Route   Pfizer COVID-19 Vaccine 11/29/2019  1:01 PM 0.3 mL 08/25/2019 Intramuscular   Manufacturer: [place]   Lot: [id number]   [place]: [id number]

## 2019-12-07 ENCOUNTER — Other Ambulatory Visit: Payer: Self-pay | Admitting: Emergency Medicine

## 2020-01-04 ENCOUNTER — Other Ambulatory Visit: Payer: Self-pay | Admitting: Emergency Medicine

## 2020-01-04 DIAGNOSIS — E782 Mixed hyperlipidemia: Secondary | ICD-10-CM

## 2020-01-04 NOTE — Telephone Encounter (Signed)
Requested medication (s) are due for refill today: Yes  Requested medication (s) are on the active medication list: Yes  Last refill:  12/07/19  Future visit scheduled: No  Notes to clinic:  Left pt. A message to call and schedule an appointment.    Requested Prescriptions  Pending Prescriptions Disp Refills   rosuvastatin (CRESTOR) 10 MG tablet [Pharmacy Med Name: ROSUVASTATIN CALCIUM 10 MG TAB] 30 tablet 0    Sig: TAKE 1 TABLET BY MOUTH EVERY DAY      Cardiovascular:  Antilipid - Statins Failed - 01/04/2020  9:30 AM      Failed - Total Cholesterol in normal range and within 360 days    Cholesterol, Total  Date Value Ref Range Status  11/10/2018 282 (H) 100 - 199 mg/dL Final          Failed - LDL in normal range and within 360 days    LDL Calculated  Date Value Ref Range Status  11/10/2018 184 (H) 0 - 99 mg/dL Final          Failed - HDL in normal range and within 360 days    HDL  Date Value Ref Range Status  11/10/2018 41 >39 mg/dL Final          Failed - Triglycerides in normal range and within 360 days    Triglycerides  Date Value Ref Range Status  11/10/2018 287 (H) 0 - 149 mg/dL Final          Failed - Valid encounter within last 12 months    Recent Outpatient Visits           1 year ago General weakness   [facility] at [facility], Ines Bloomer, MD              Passed - Patient is not pregnant

## 2020-01-04 NOTE — Telephone Encounter (Signed)
Please contact patient to have scheduled for follow up and labs. thanks

## 2020-01-09 NOTE — Telephone Encounter (Signed)
Called pt. And LVM to call back for appt.

## 2020-01-20 ENCOUNTER — Other Ambulatory Visit: Payer: Self-pay | Admitting: Emergency Medicine

## 2020-01-20 DIAGNOSIS — E782 Mixed hyperlipidemia: Secondary | ICD-10-CM

## 2020-01-20 NOTE — Telephone Encounter (Signed)
>   3 months overdue for appt, no future appts Called pt and LM on VM to call office to make appt.  Courtesy refill previously given.

## 2020-01-31 ENCOUNTER — Other Ambulatory Visit: Payer: Self-pay

## 2020-01-31 ENCOUNTER — Encounter: Payer: Self-pay | Admitting: Emergency Medicine

## 2020-01-31 ENCOUNTER — Ambulatory Visit: Payer: BC Managed Care – PPO | Admitting: Emergency Medicine

## 2020-01-31 VITALS — BP 122/74 | HR 62 | Temp 98.2°F | Resp 15 | Ht 71.0 in | Wt 247.0 lb

## 2020-01-31 DIAGNOSIS — Z136 Encounter for screening for cardiovascular disorders: Secondary | ICD-10-CM | POA: Diagnosis not present

## 2020-01-31 DIAGNOSIS — E785 Hyperlipidemia, unspecified: Secondary | ICD-10-CM | POA: Diagnosis not present

## 2020-01-31 DIAGNOSIS — Z79899 Other long term (current) drug therapy: Secondary | ICD-10-CM | POA: Diagnosis not present

## 2020-01-31 DIAGNOSIS — Z1329 Encounter for screening for other suspected endocrine disorder: Secondary | ICD-10-CM

## 2020-01-31 DIAGNOSIS — Z1211 Encounter for screening for malignant neoplasm of colon: Secondary | ICD-10-CM | POA: Diagnosis not present

## 2020-01-31 DIAGNOSIS — Z130 Encounter for screening for diseases of the blood and blood-forming organs and certain disorders involving the immune mechanism: Secondary | ICD-10-CM

## 2020-01-31 DIAGNOSIS — Z13220 Encounter for screening for lipoid disorders: Secondary | ICD-10-CM

## 2020-01-31 DIAGNOSIS — L723 Sebaceous cyst: Secondary | ICD-10-CM

## 2020-01-31 DIAGNOSIS — Z13228 Encounter for screening for other metabolic disorders: Secondary | ICD-10-CM

## 2020-01-31 DIAGNOSIS — Z1321 Encounter for screening for nutritional disorder: Secondary | ICD-10-CM

## 2020-01-31 NOTE — Patient Instructions (Addendum)
   If you have lab work done today you will be contacted with your lab results within the next 2 weeks.  If you have not heard from us then please contact us. The fastest way to get your results is to register for My Chart.   IF you received an x-ray today, you will receive an invoice from [facility]. Please contact [facility] at [phone] with questions or concerns regarding your invoice.   IF you received labwork today, you will receive an invoice from [facility]. Please contact [facility] at [phone] with questions or concerns regarding your invoice.   Our billing staff will not be able to assist you with questions regarding bills from these companies.  You will be contacted with the lab results as soon as they are available. The fastest way to get your results is to activate your My Chart account. Instructions are located on the last page of this paperwork. If you have not heard from us regarding the results in 2 weeks, please contact this office.      Health Maintenance, Male Adopting a healthy lifestyle and getting preventive care are important in promoting health and wellness. Ask your health care provider about:  The right schedule for you to have regular tests and exams.  Things you can do on your own to prevent diseases and keep yourself healthy. What should I know about diet, weight, and exercise? Eat a healthy diet   Eat a diet that includes plenty of vegetables, fruits, low-fat dairy products, and lean protein.  Do not eat a lot of foods that are high in solid fats, added sugars, or sodium. Maintain a healthy weight Body mass index (BMI) is a measurement that can be used to identify possible weight problems. It estimates body fat based on height and weight. Your health care provider can help determine your BMI and help you achieve or maintain a healthy weight. Get regular exercise Get regular exercise. This is one of the most important things you  can do for your health. Most adults should:  Exercise for at least 150 minutes each week. The exercise should increase your heart rate and make you sweat (moderate-intensity exercise).  Do strengthening exercises at least twice a week. This is in addition to the moderate-intensity exercise.  Spend less time sitting. Even light physical activity can be beneficial. Watch cholesterol and blood lipids Have your blood tested for lipids and cholesterol at 55 years of age, then have this test every 5 years. You may need to have your cholesterol levels checked more often if:  Your lipid or cholesterol levels are high.  You are older than 55 years of age.  You are at high risk for heart disease. What should I know about cancer screening? Many types of cancers can be detected early and may often be prevented. Depending on your health history and family history, you may need to have cancer screening at various ages. This may include screening for:  Colorectal cancer.  Prostate cancer.  Skin cancer.  Lung cancer. What should I know about heart disease, diabetes, and high blood pressure? Blood pressure and heart disease  High blood pressure causes heart disease and increases the risk of stroke. This is more likely to develop in people who have high blood pressure readings, are of African descent, or are overweight.  Talk with your health care provider about your target blood pressure readings.  Have your blood pressure checked: ? Every 3-5 years if you are 18-39   years of age. ? Every year if you are 40 years old or older.  If you are between the ages of 65 and 75 and are a current or former smoker, ask your health care provider if you should have a one-time screening for abdominal aortic aneurysm (AAA). Diabetes Have regular diabetes screenings. This checks your fasting blood sugar level. Have the screening done:  Once every three years after age 45 if you are at a normal weight and have  a low risk for diabetes.  More often and at a younger age if you are overweight or have a high risk for diabetes. What should I know about preventing infection? Hepatitis B If you have a higher risk for hepatitis B, you should be screened for this virus. Talk with your health care provider to find out if you are at risk for hepatitis B infection. Hepatitis C Blood testing is recommended for:  Everyone born from 1945 through 1965.  Anyone with known risk factors for hepatitis C. Sexually transmitted infections (STIs)  You should be screened each year for STIs, including gonorrhea and chlamydia, if: ? You are sexually active and are younger than 55 years of age. ? You are older than 55 years of age and your health care provider tells you that you are at risk for this type of infection. ? Your sexual activity has changed since you were last screened, and you are at increased risk for chlamydia or gonorrhea. Ask your health care provider if you are at risk.  Ask your health care provider about whether you are at high risk for HIV. Your health care provider may recommend a prescription medicine to help prevent HIV infection. If you choose to take medicine to prevent HIV, you should first get tested for HIV. You should then be tested every 3 months for as long as you are taking the medicine. Follow these instructions at home: Lifestyle  Do not use any products that contain nicotine or tobacco, such as cigarettes, e-cigarettes, and chewing tobacco. If you need help quitting, ask your health care provider.  Do not use street drugs.  Do not share needles.  Ask your health care provider for help if you need support or information about quitting drugs. Alcohol use  Do not drink alcohol if your health care provider tells you not to drink.  If you drink alcohol: ? Limit how much you have to 0-2 drinks a day. ? Be aware of how much alcohol is in your drink. In the [place] one drink equals one 12  oz bottle of beer (355 mL), one 5 oz glass of wine (148 mL), or one 1 oz glass of hard liquor (44 mL). General instructions  Schedule regular health, dental, and eye exams.  Stay current with your vaccines.  Tell your health care provider if: ? You often feel depressed. ? You have ever been abused or do not feel safe at home. Summary  Adopting a healthy lifestyle and getting preventive care are important in promoting health and wellness.  Follow your health care provider's instructions about healthy diet, exercising, and getting tested or screened for diseases.  Follow your health care provider's instructions on monitoring your cholesterol and blood pressure. This information is not intended to replace advice given to you by your health care provider. Make sure you discuss any questions you have with your health care provider. Document Revised: 08/24/2018 Document Reviewed: 08/24/2018 Elsevier Patient Education  2020 Elsevier Inc.  

## 2020-01-31 NOTE — Progress Notes (Signed)
Ricardo [name] 54 y.o.   Chief Complaint  Patient presents with  . medication changes    pt states Ricardo [name] stopped his crestor want labwork to check if hes okay, stopped 3 weeks ago  . Referral    pt needs referral to GI colonoscopy, also wants referral for cyst removal    HISTORY OF PRESENT ILLNESS: This is a 54 y.o. male here for follow-up.  Eating better and exercising more.  Has lost significant amount of weight and feels significantly better.  Concerned about need for cholesterol medication.  Has history of dyslipidemia but not taking Crestor at present time.  Biking much more than before and wants to make sure that Ricardo [name] is in good condition to engage in long distance biking runs during the summer.  Denies chest pain on exertion or unreasonable dyspnea on exertion. No cardiac history.  Non-smoker. 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  HPI   Prior to Admission medications   Medication Sig Start Date End Date Taking? Authorizing Provider  docusate sodium (COLACE) 100 MG capsule Take 1 capsule (100 mg total) by mouth 2 (two) times daily. Patient not taking: Reported on 01/31/2020 01/18/19   Margie Billet A, PA-C  ibuprofen (ADVIL) 200 MG tablet Take 400 mg by mouth every 6 (six) hours as needed (for arthritis pain).     [provider]  methylPREDNISolone (MEDROL) 4 MG tablet Medrol dose pack. Take as instructed Patient not taking: Reported on 01/31/2020 03/07/19   Mcarthur Rossetti, MD  oxyCODONE (OXY IR/ROXICODONE) 5 MG immediate release tablet Take 1 tablet (5 mg total) by mouth every 6 (six) hours as needed for severe pain. Patient  not taking: Reported on 01/31/2020 01/18/19   Margie Billet A, PA-C  polyethylene glycol (MIRALAX / GLYCOLAX) 17 g packet Take 17 g by mouth daily. Patient not taking: Reported on 01/31/2020 01/19/19   Margie Billet A, PA-C  rosuvastatin (CRESTOR) 10 MG tablet TAKE 1 TABLET BY MOUTH EVERY DAY Patient not taking: Reported on 01/31/2020 01/04/20   [name], MD    No Known Allergies  Patient Active Problem List   Diagnosis Date Noted  . Body mass index (BMI) of 39.0-39.9 in adult 11/10/2018  . Arthralgia 11/10/2018  . Sleep disorder 11/10/2018    Past Medical History:  Diagnosis Date  . Arthritis   . Family history of adverse reaction to anesthesia    mother had problem with it due to her asthma    Past Surgical History:  Procedure Laterality Date  . LAPAROSCOPIC APPENDECTOMY N/A 01/17/2019   Procedure: APPENDECTOMY LAPAROSCOPIC;  Surgeon: Ralene Ok, MD;  Location: [facility];  Service: General;  Laterality: N/A;  . SHOULDER SURGERY Left 09/19/2015    Social History   Socioeconomic History  . Marital status: Single    Spouse name: Not on file  . Number of children: Not on file  . Years of education: Not on file  . Highest education level: Not on file  Occupational History  . Not on file  Tobacco Use  . Smoking status:  Never Smoker  . Smokeless tobacco: Never Used  Substance and Sexual Activity  . Alcohol use: Never    Comment: rare  . Drug use: Never  . Sexual activity: Not on file  Other Topics Concern  . Not on file  Social History Narrative  . Not on file   Social Determinants of Health   Financial Resource Strain:   . Difficulty of Paying Living Expenses:   Food Insecurity:   . Worried About Charity fundraiser in the Last Year:   . Arboriculturist in the Last Year:   Transportation Needs:   . Film/video editor (Medical):   Marland Kitchen Lack of Transportation (Non-Medical):   Physical Activity:   . Days of Exercise per Week:   . Minutes of Exercise per  Session:   Stress:   . Feeling of Stress :   Social Connections:   . Frequency of Communication with Friends and Family:   . Frequency of Social Gatherings with Friends and Family:   . Attends Religious Services:   . Active Member of Clubs or Organizations:   . Attends Archivist Meetings:   Marland Kitchen Marital Status:   Intimate Partner Violence:   . Fear of Current or Ex-Partner:   . Emotionally Abused:   Marland Kitchen Physically Abused:   . Sexually Abused:     Family History  Problem Relation Age of Onset  . Cancer Sister   . Cancer Brother   . High Cholesterol Brother      Review of Systems  Constitutional: Negative.  Negative for chills and fever.  HENT: Negative.  Negative for congestion and sore throat.   Respiratory: Negative.  Negative for cough, sputum production and shortness of breath.   Cardiovascular: Negative.  Negative for chest pain, palpitations, orthopnea, claudication, leg swelling and PND.  Gastrointestinal: Negative.  Negative for abdominal pain, blood in stool, diarrhea, melena, nausea and vomiting.  Genitourinary: Negative.  Negative for dysuria and hematuria.  Musculoskeletal: Negative.  Negative for myalgias and neck pain.  Skin: Negative.  Negative for rash.  Neurological: Negative.  Negative for dizziness and headaches.  All other systems reviewed and are negative.  Vitals:   01/31/20 1519  BP: 122/74  Pulse: 62  Resp: 15  Temp: 98.2 F (36.8 C)  SpO2: 97%     Physical Exam Vitals reviewed.  Constitutional:      Appearance: Normal appearance.  HENT:     Head: Normocephalic.     Mouth/Throat:     Mouth: Mucous membranes are moist.     Pharynx: Oropharynx is clear.  Eyes:     Extraocular Movements: Extraocular movements intact.     Conjunctiva/sclera: Conjunctivae normal.     Pupils: Pupils are equal, round, and reactive to light.  Cardiovascular:     Rate and Rhythm: Normal rate and regular rhythm.     Pulses: Normal pulses.     Heart  sounds: Normal heart sounds.  Pulmonary:     Effort: Pulmonary effort is normal.     Breath sounds: Normal breath sounds.  Abdominal:     General: Bowel sounds are normal. There is no distension.     Palpations: Abdomen is soft.     Tenderness: There is no abdominal tenderness.  Musculoskeletal:        General: Normal range of motion.     Cervical back: Normal range of motion and neck supple.     Right lower leg: No edema.  Left lower leg: No edema.  Skin:    General: Skin is warm and dry.     Capillary Refill: Capillary refill takes less than 2 seconds.     Comments: Sebaceous cyst to right lower sternal border area  Neurological:     General: No focal deficit present.     Mental Status: Ricardo [name] is alert and oriented to person, place, and time.  Psychiatric:        Mood and Affect: Mood normal.        Behavior: Behavior normal.     EKG: Normal sinus rhythm with ventricular rate of 64.  No acute ischemic changes.  Normal EKG.  ASSESSMENT & PLAN: Daryel was seen today for medication changes and referral.  Diagnoses and all orders for this visit:  Dyslipidemia  Encounter for medication management  Sebaceous cyst Comments: Chest area  Colon cancer screening -     Ambulatory referral to Gastroenterology  Screening for deficiency anemia -     CBC with Differential/Platelet  Screening for lipoid disorders -     Lipid panel  Screening for endocrine, nutritional, metabolic and immunity disorder -     Comprehensive metabolic panel -     Hemoglobin A1c  Screening for heart disease -     EKG 12-Lead    Patient Instructions       If you have lab work done today you will be contacted with your lab results within the next 2 weeks.  If you have not heard from [place] then please contact us. The fastest way to get your results is to register for My Chart.   IF you received an x-ray today, you will receive an invoice from [facility]. Please contact [facility] at [phone] with questions or concerns regarding your invoice.   IF you received labwork today, you will receive an invoice from [facility]. Please contact [facility] at [phone] with questions or concerns regarding your invoice.   Our billing staff will not be able to assist you with questions regarding bills from these companies.  You will be contacted with the lab results as soon as they are available. The fastest way to get your results is to activate your My Chart account. Instructions are located on the last page of this paperwork. If you have not heard from [place] regarding the results in 2 weeks, please contact this office.     Health Maintenance, Male Adopting a healthy lifestyle and getting preventive care are important in promoting health and wellness. Ask your health care provider about:  The right schedule for you to have regular tests and exams.  Things you can do on your own to prevent diseases and keep yourself healthy. What should I know about diet, weight, and exercise? Eat a healthy diet   Eat a diet that includes plenty of vegetables, fruits, low-fat dairy products, and lean protein.  Do not eat a lot of foods that are high in solid fats, added sugars, or sodium. Maintain a healthy weight Body mass index (BMI) is a measurement that can be used to identify possible weight problems. It estimates body fat based on height and weight. Your health care provider can help determine your BMI and help you achieve or maintain a healthy weight. Get regular exercise Get regular exercise. This is one of the most important things you can do for your health. Most adults should:  Exercise for at least 150 minutes each week. The exercise should increase your heart rate and make  you sweat (moderate-intensity exercise).  Do strengthening exercises at least twice a week. This is in addition to the moderate-intensity exercise.  Spend less time sitting. Even light physical  activity can be beneficial. Watch cholesterol and blood lipids Have your blood tested for lipids and cholesterol at 55 years of age, then have this test every 5 years. You may need to have your cholesterol levels checked more often if:  Your lipid or cholesterol levels are high.  You are older than 55 years of age.  You are at high risk for heart disease. What should I know about cancer screening? Many types of cancers can be detected early and may often be prevented. Depending on your health history and family history, you may need to have cancer screening at various ages. This may include screening for:  Colorectal cancer.  Prostate cancer.  Skin cancer.  Lung cancer. What should I know about heart disease, diabetes, and high blood pressure? Blood pressure and heart disease  High blood pressure causes heart disease and increases the risk of stroke. This is more likely to develop in people who have high blood pressure readings, are of African descent, or are overweight.  Talk with your health care provider about your target blood pressure readings.  Have your blood pressure checked: ? Every 3-5 years if you are 62-64 years of age. ? Every year if you are 26 years old or older.  If you are between the ages of 16 and 55 and are a current or former smoker, ask your health care provider if you should have a one-time screening for abdominal aortic aneurysm (AAA). Diabetes Have regular diabetes screenings. This checks your fasting blood sugar level. Have the screening done:  Once every three years after age 40 if you are at a normal weight and have a low risk for diabetes.  More often and at a younger age if you are overweight or have a high risk for diabetes. What should I know about preventing infection? Hepatitis B If you have a higher risk for hepatitis B, you should be screened for this virus. Talk with your health care provider to find out if you are at risk for hepatitis B  infection. Hepatitis C Blood testing is recommended for:  Everyone born from [birth year] through 1966.  Anyone with known risk factors for hepatitis C. Sexually transmitted infections (STIs)  You should be screened each year for STIs, including gonorrhea and chlamydia, if: ? You are sexually active and are younger than 55 years of age. ? You are older than 55 years of age and your health care provider tells you that you are at risk for this type of infection. ? Your sexual activity has changed since you were last screened, and you are at increased risk for chlamydia or gonorrhea. Ask your health care provider if you are at risk.  Ask your health care provider about whether you are at high risk for HIV. Your health care provider may recommend a prescription medicine to help prevent HIV infection. If you choose to take medicine to prevent HIV, you should first get tested for HIV. You should then be tested every 3 months for as long as you are taking the medicine. Follow these instructions at home: Lifestyle  Do not use any products that contain nicotine or tobacco, such as cigarettes, e-cigarettes, and chewing tobacco. If you need help quitting, ask your health care provider.  Do not use street drugs.  Do not share needles.  Ask  your health care provider for help if you need support or information about quitting drugs. Alcohol use  Do not drink alcohol if your health care provider tells you not to drink.  If you drink alcohol: ? Limit how much you have to 0-2 drinks a day. ? Be aware of how much alcohol is in your drink. In the [place] one drink equals one 12 oz bottle of beer (355 mL), one 5 oz glass of wine (148 mL), or one 1 oz glass of hard liquor (44 mL). General instructions  Schedule regular health, dental, and eye exams.  Stay current with your vaccines.  Tell your health care provider if: ? You often feel depressed. ? You have ever been abused or do not feel safe at  home. Summary  Adopting a healthy lifestyle and getting preventive care are important in promoting health and wellness.  Follow your health care provider's instructions about healthy diet, exercising, and getting tested or screened for diseases.  Follow your health care provider's instructions on monitoring your cholesterol and blood pressure. This information is not intended to replace advice given to you by your health care provider. Make sure you discuss any questions you have with your health care provider. Document Revised: 08/24/2018 Document Reviewed: 08/24/2018 Elsevier Patient Education  2020 Elsevier Inc.      Agustina Caroli, MD [facility]

## 2020-02-01 LAB — COMPREHENSIVE METABOLIC PANEL
ALT: 19 IU/L (ref 0–44)
AST: 18 IU/L (ref 0–40)
Albumin/Globulin Ratio: 1.8 (ref 1.2–2.2)
Albumin: 4.4 g/dL (ref 3.8–4.9)
Alkaline Phosphatase: 81 IU/L (ref 48–121)
BUN/Creatinine Ratio: 15 (ref 9–20)
BUN: 17 mg/dL (ref 6–24)
Bilirubin Total: 0.5 mg/dL (ref 0.0–1.2)
CO2: 22 mmol/L (ref 20–29)
Calcium: 9.4 mg/dL (ref 8.7–10.2)
Chloride: 100 mmol/L (ref 96–106)
Creatinine, Ser: 1.17 mg/dL (ref 0.76–1.27)
GFR calc Af Amer: 81 mL/min/{1.73_m2} (ref >59)
GFR calc non Af Amer: 70 mL/min/{1.73_m2} (ref >59)
Globulin, Total: 2.5 g/dL (ref 1.5–4.5)
Glucose: 92 mg/dL (ref 65–99)
Potassium: 3.9 mmol/L (ref 3.5–5.2)
Sodium: 138 mmol/L (ref 134–144)
Total Protein: 6.9 g/dL (ref 6.0–8.5)

## 2020-02-01 LAB — CBC WITH DIFFERENTIAL/PLATELET
Basophils Absolute: 0 10*3/uL (ref 0.0–0.2)
Basos: 1 %
EOS (ABSOLUTE): 0.1 10*3/uL (ref 0.0–0.4)
Eos: 1 %
Hematocrit: 45.5 % (ref 37.5–51.0)
Hemoglobin: 15 g/dL (ref 13.0–17.7)
Immature Grans (Abs): 0 10*3/uL (ref 0.0–0.1)
Immature Granulocytes: 0 %
Lymphocytes Absolute: 1.5 10*3/uL (ref 0.7–3.1)
Lymphs: 23 %
MCH: 30.2 pg (ref 26.6–33.0)
MCHC: 33 g/dL (ref 31.5–35.7)
MCV: 92 fL (ref 79–97)
Monocytes Absolute: 0.7 10*3/uL (ref 0.1–0.9)
Monocytes: 11 %
Neutrophils Absolute: 4.1 10*3/uL (ref 1.4–7.0)
Neutrophils: 64 %
Platelets: 229 10*3/uL (ref 150–450)
RBC: 4.96 x10E6/uL (ref 4.14–5.80)
RDW: 13.9 % (ref 11.6–15.4)
WBC: 6.3 10*3/uL (ref 3.4–10.8)

## 2020-02-01 LAB — LIPID PANEL
Chol/HDL Ratio: 4.2 ratio (ref 0.0–5.0)
Cholesterol, Total: 208 mg/dL — ABNORMAL HIGH (ref 100–199)
HDL: 50 mg/dL (ref >39)
LDL Chol Calc (NIH): 135 mg/dL — ABNORMAL HIGH (ref 0–99)
Triglycerides: 127 mg/dL (ref 0–149)
VLDL Cholesterol Cal: 23 mg/dL (ref 5–40)

## 2020-02-01 LAB — HEMOGLOBIN A1C
Est. average glucose Bld gHb Est-mCnc: 97 mg/dL
Hgb A1c MFr Bld: 5 % (ref 4.8–5.6)

## 2020-09-03 ENCOUNTER — Ambulatory Visit: Payer: BC Managed Care – PPO | Admitting: Emergency Medicine

## 2020-09-04 ENCOUNTER — Encounter: Payer: Self-pay | Admitting: Emergency Medicine

## 2020-09-04 ENCOUNTER — Ambulatory Visit (INDEPENDENT_AMBULATORY_CARE_PROVIDER_SITE_OTHER): Payer: BC Managed Care – PPO | Admitting: Emergency Medicine

## 2020-09-04 ENCOUNTER — Other Ambulatory Visit: Payer: Self-pay

## 2020-09-04 VITALS — BP 123/69 | HR 71 | Temp 98.0°F | Resp 16 | Ht 71.0 in | Wt 249.0 lb

## 2020-09-04 DIAGNOSIS — R109 Unspecified abdominal pain: Secondary | ICD-10-CM | POA: Diagnosis not present

## 2020-09-04 DIAGNOSIS — Z1211 Encounter for screening for malignant neoplasm of colon: Secondary | ICD-10-CM

## 2020-09-04 DIAGNOSIS — Z6834 Body mass index (BMI) 34.0-34.9, adult: Secondary | ICD-10-CM

## 2020-09-04 DIAGNOSIS — Z20822 Contact with and (suspected) exposure to covid-19: Secondary | ICD-10-CM

## 2020-09-04 NOTE — Progress Notes (Signed)
Ricardo [name] 54 y.o.   Chief Complaint  Patient presents with  . Referral    GI - Per patient started 7 days ago stomach cramps causing constipation  . Abdominal Pain    Per patient 7 days ago    HISTORY OF PRESENT ILLNESS: This is a 54 y.o. male complaining of intermittent generalized abdominal cramping for the past 7 days.  Still able to eat and drink.  Denies nausea or vomiting.  Denies diarrhea.  Started taking Metamucil 2-3 times a day with good results.  Denies fever or chills. Bowel movement has been irregular.  Denies any other associated significant symptoms.  Requesting another referral to GI for colonoscopy. Had flulike symptoms last Thanksgiving and may still have some residual post viral symptoms. No other complaints or medical concerns today.  HPI   Prior to Admission medications   Medication Sig Start Date End Date Taking? Authorizing Provider  ibuprofen (ADVIL) 200 MG tablet Take 400 mg by mouth every 6 (six) hours as needed (for arthritis pain).    Yes [provider]  polyethylene glycol (MIRALAX / GLYCOLAX) 17 g packet Take 17 g by mouth daily. Patient not taking: Reported on 09/04/2020 01/19/19   Margie Billet A, PA-C  rosuvastatin (CRESTOR) 10 MG tablet TAKE 1 TABLET BY MOUTH EVERY DAY Patient not taking: Reported on 09/04/2020 01/04/20   [name], MD    No Known Allergies  Patient Active Problem List   Diagnosis Date Noted  . Body mass index (BMI) of 39.0-39.9 in adult 11/10/2018  . Arthralgia 11/10/2018  . Sleep disorder 11/10/2018    Past Medical History:  Diagnosis Date  . Arthritis   . Family history of adverse reaction to anesthesia    mother had problem with it due to her asthma    Past Surgical History:  Procedure Laterality Date  . LAPAROSCOPIC APPENDECTOMY N/A 01/17/2019   Procedure: APPENDECTOMY LAPAROSCOPIC;  Surgeon: Ralene Ok, MD;  Location: [facility];  Service: General;  Laterality: N/A;  . SHOULDER SURGERY  Left 09/19/2015    Social History   Socioeconomic History  . Marital status: Single    Spouse name: Not on file  . Number of children: Not on file  . Years of education: Not on file  . Highest education level: Not on file  Occupational History  . Not on file  Tobacco Use  . Smoking status: Never Smoker  . Smokeless tobacco: Never Used  Substance and Sexual Activity  . Alcohol use: Never    Comment: rare  . Drug use: Never  . Sexual activity: Not on file  Other Topics Concern  . Not on file  Social History Narrative  . Not on file   Social Determinants of Health   Financial Resource Strain: Not on file  Food Insecurity: Not on file  Transportation Needs: Not on file  Physical Activity: Not on file  Stress: Not on file  Social Connections: Not on file  Intimate Partner Violence: Not on file    Family History  Problem Relation Age of Onset  . Cancer Sister   . Cancer Brother   . High Cholesterol Brother      Review of Systems  Constitutional: Negative.  Negative for chills and fever.  HENT: Negative.  Negative for congestion and sore throat.   Respiratory: Negative.  Negative for cough and shortness of breath.   Cardiovascular: Negative.  Negative for chest pain and palpitations.  Gastrointestinal: Negative for abdominal pain and nausea.  Genitourinary: Negative.  Negative for dysuria and hematuria.  Musculoskeletal: Negative.  Negative for back pain, myalgias and neck pain.  Skin: Negative.  Negative for rash.  Neurological: Negative.  Negative for dizziness and headaches.  All other systems reviewed and are negative.  Today's Vitals   09/04/20 0819  BP: 123/69  Pulse: 71  Resp: 16  Temp: 98 F (36.7 C)  TempSrc: Temporal  SpO2: 96%  Weight: 249 lb (112.9 kg)  Height: 5\' 11"  (1.803 m)   Body mass index is 34.73 kg/m. Wt Readings from Last 3 Encounters:  09/04/20 249 lb (112.9 kg)  01/31/20 247 lb (112 kg)  01/17/19 264 lb 15.9 oz (120.2 kg)      Physical Exam Vitals reviewed.  Constitutional:      Appearance: He is well-developed.  HENT:     Head: Normocephalic.     Mouth/Throat:     Mouth: Mucous membranes are moist.     Pharynx: Oropharynx is clear.  Eyes:     Extraocular Movements: Extraocular movements intact.     Conjunctiva/sclera: Conjunctivae normal.     Pupils: Pupils are equal, round, and reactive to light.  Cardiovascular:     Rate and Rhythm: Normal rate and regular rhythm.     Pulses: Normal pulses.     Heart sounds: Normal heart sounds.  Pulmonary:     Effort: Pulmonary effort is normal.     Breath sounds: Normal breath sounds.  Abdominal:     General: Bowel sounds are normal. There is no distension.     Palpations: Abdomen is soft. There is no mass.     Tenderness: There is no abdominal tenderness.  Musculoskeletal:        General: Normal range of motion.     Cervical back: Normal range of motion and neck supple.  Skin:    General: Skin is warm and dry.     Capillary Refill: Capillary refill takes less than 2 seconds.  Neurological:     General: No focal deficit present.     Mental Status: He is alert and oriented to person, place, and time.  Psychiatric:        Mood and Affect: Mood normal.        Behavior: Behavior normal.    A total of 30 minutes was spent with the patient, greater than 50% of which was in counseling/coordination of care regarding differential diagnosis of abdominal pain and dysfunctional bowel, education on nutrition, review of most recent blood work results, review of most recent office visit notes, health maintenance items, need for blood work and GI referral, prognosis, documentation, and need for follow-up.   ASSESSMENT & PLAN: Ricardo [name] was seen today for referral and abdominal pain.  Diagnoses and all orders for this visit:  Functional abdominal pain syndrome -     Ambulatory referral to Gastroenterology -     Lipid panel -     CBC with Differential/Platelet -      Comprehensive metabolic panel  Colon cancer screening -     Ambulatory referral to Gastroenterology  Exposure to COVID-19 virus -     SAR CoV2 Serology (COVID 19)AB(IGG)IA  Body mass index (BMI) of 34.0-34.9 in adult    Patient Instructions       If you have lab work done today you will be contacted with your lab results within the next 2 weeks.  If you have not heard from [place] then please contact us. The fastest way to get your results  is to register for My Chart.   IF you received an x-ray today, you will receive an invoice from [facility]. Please contact [facility] at [phone] with questions or concerns regarding your invoice.   IF you received labwork today, you will receive an invoice from [place]. Please contact [facility] at [phone] with questions or concerns regarding your invoice.   Our billing staff will not be able to assist you with questions regarding bills from these companies.  You will be contacted with the lab results as soon as they are available. The fastest way to get your results is to activate your My Chart account. Instructions are located on the last page of this paperwork. If you have not heard from [place] regarding the results in 2 weeks, please contact this office.     Abdominal Pain, Adult Many things can cause belly (abdominal) pain. Most times, belly pain is not dangerous. Many cases of belly pain can be watched and treated at home. Sometimes, though, belly pain is serious. Your doctor will try to find the cause of your belly pain. Follow these instructions at home:  Medicines  Take over-the-counter and prescription medicines only as told by your doctor.  Do not take medicines that help you poop (laxatives) unless told by your doctor. General instructions  Watch your belly pain for any changes.  Drink enough fluid to keep your pee (urine) pale yellow.  Keep all follow-up visits as told by your doctor. This is  important. Contact a doctor if:  Your belly pain changes or gets worse.  You are not hungry, or you lose weight without trying.  You are having trouble pooping (constipated) or have watery poop (diarrhea) for more than 2-3 days.  You have pain when you pee or poop.  Your belly pain wakes you up at night.  Your pain gets worse with meals, after eating, or with certain foods.  You are vomiting and cannot keep anything down.  You have a fever.  You have blood in your pee. Get help right away if:  Your pain does not go away as soon as your doctor says it should.  You cannot stop vomiting.  Your pain is only in areas of your belly, such as the right side or the left lower part of the belly.  You have bloody or black poop, or poop that looks like tar.  You have very bad pain, cramping, or bloating in your belly.  You have signs of not having enough fluid or water in your body (dehydration), such as: ? Dark pee, very little pee, or no pee. ? Cracked lips. ? Dry mouth. ? Sunken eyes. ? Sleepiness. ? Weakness.  You have trouble breathing or chest pain. Summary  Many cases of belly pain can be watched and treated at home.  Watch your belly pain for any changes.  Take over-the-counter and prescription medicines only as told by your doctor.  Contact a doctor if your belly pain changes or gets worse.  Get help right away if you have very bad pain, cramping, or bloating in your belly. This information is not intended to replace advice given to you by your health care provider. Make sure you discuss any questions you have with your health care provider. Document Revised: 01/09/2019 Document Reviewed: 01/09/2019 Elsevier Patient Education  2020 Elsevier Inc.      Agustina Caroli, MD [facility]

## 2020-09-04 NOTE — Patient Instructions (Addendum)
If you have lab work done today you will be contacted with your lab results within the next 2 weeks.  If you have not heard from [place] then please contact us. The fastest way to get your results is to register for My Chart.   IF you received an x-ray today, you will receive an invoice from [facility]. Please contact [facility] at [phone] with questions or concerns regarding your invoice.   IF you received labwork today, you will receive an invoice from [place]. Please contact [facility] at [phone] with questions or concerns regarding your invoice.   Our billing staff will not be able to assist you with questions regarding bills from these companies.  You will be contacted with the lab results as soon as they are available. The fastest way to get your results is to activate your My Chart account. Instructions are located on the last page of this paperwork. If you have not heard from [place] regarding the results in 2 weeks, please contact this office.     Abdominal Pain, Adult Many things can cause belly (abdominal) pain. Most times, belly pain is not dangerous. Many cases of belly pain can be watched and treated at home. Sometimes, though, belly pain is serious. Your doctor will try to find the cause of your belly pain. Follow these instructions at home:  Medicines  Take over-the-counter and prescription medicines only as told by your doctor.  Do not take medicines that help you poop (laxatives) unless told by your doctor. General instructions  Watch your belly pain for any changes.  Drink enough fluid to keep your pee (urine) pale yellow.  Keep all follow-up visits as told by your doctor. This is important. Contact a doctor if:  Your belly pain changes or gets worse.  You are not hungry, or you lose weight without trying.  You are having trouble pooping (constipated) or have watery poop (diarrhea) for more than 2-3 days.  You have pain when you pee  or poop.  Your belly pain wakes you up at night.  Your pain gets worse with meals, after eating, or with certain foods.  You are vomiting and cannot keep anything down.  You have a fever.  You have blood in your pee. Get help right away if:  Your pain does not go away as soon as your doctor says it should.  You cannot stop vomiting.  Your pain is only in areas of your belly, such as the right side or the left lower part of the belly.  You have bloody or black poop, or poop that looks like tar.  You have very bad pain, cramping, or bloating in your belly.  You have signs of not having enough fluid or water in your body (dehydration), such as: ? Dark pee, very little pee, or no pee. ? Cracked lips. ? Dry mouth. ? Sunken eyes. ? Sleepiness. ? Weakness.  You have trouble breathing or chest pain. Summary  Many cases of belly pain can be watched and treated at home.  Watch your belly pain for any changes.  Take over-the-counter and prescription medicines only as told by your doctor.  Contact a doctor if your belly pain changes or gets worse.  Get help right away if you have very bad pain, cramping, or bloating in your belly. This information is not intended to replace advice given to you by your health care provider. Make sure you discuss any questions you have with your health  care provider. Document Revised: 01/09/2019 Document Reviewed: 01/09/2019 Elsevier Patient Education  2020 Elsevier Inc.  

## 2020-09-05 LAB — CBC WITH DIFFERENTIAL/PLATELET
Basophils Absolute: 0 10*3/uL (ref 0.0–0.2)
Basos: 1 %
EOS (ABSOLUTE): 0.2 10*3/uL (ref 0.0–0.4)
Eos: 3 %
Hematocrit: 47.9 % (ref 37.5–51.0)
Hemoglobin: 16.7 g/dL (ref 13.0–17.7)
Immature Grans (Abs): 0 10*3/uL (ref 0.0–0.1)
Immature Granulocytes: 0 %
Lymphocytes Absolute: 1.2 10*3/uL (ref 0.7–3.1)
Lymphs: 18 %
MCH: 30.5 pg (ref 26.6–33.0)
MCHC: 34.9 g/dL (ref 31.5–35.7)
MCV: 87 fL (ref 79–97)
Monocytes Absolute: 0.6 10*3/uL (ref 0.1–0.9)
Monocytes: 9 %
Neutrophils Absolute: 4.6 10*3/uL (ref 1.4–7.0)
Neutrophils: 69 %
Platelets: 301 10*3/uL (ref 150–450)
RBC: 5.48 x10E6/uL (ref 4.14–5.80)
RDW: 12.7 % (ref 11.6–15.4)
WBC: 6.6 10*3/uL (ref 3.4–10.8)

## 2020-09-05 LAB — SAR COV2 SEROLOGY (COVID19)AB(IGG),IA
SARS-CoV-2 Semi-Quant IgG Ab: 39.9 AU/mL (ref <13.0)
SARS-CoV-2 Spike Ab Interp: POSITIVE

## 2020-09-05 LAB — COMPREHENSIVE METABOLIC PANEL
ALT: 20 IU/L (ref 0–44)
AST: 21 IU/L (ref 0–40)
Albumin/Globulin Ratio: 1.7 (ref 1.2–2.2)
Albumin: 4.6 g/dL (ref 3.8–4.9)
Alkaline Phosphatase: 104 IU/L (ref 44–121)
BUN/Creatinine Ratio: 14 (ref 9–20)
BUN: 15 mg/dL (ref 6–24)
Bilirubin Total: 0.6 mg/dL (ref 0.0–1.2)
CO2: 20 mmol/L (ref 20–29)
Calcium: 9.6 mg/dL (ref 8.7–10.2)
Chloride: 103 mmol/L (ref 96–106)
Creatinine, Ser: 1.09 mg/dL (ref 0.76–1.27)
GFR calc Af Amer: 88 mL/min/{1.73_m2} (ref >59)
GFR calc non Af Amer: 76 mL/min/{1.73_m2} (ref >59)
Globulin, Total: 2.7 g/dL (ref 1.5–4.5)
Glucose: 87 mg/dL (ref 65–99)
Potassium: 4.6 mmol/L (ref 3.5–5.2)
Sodium: 138 mmol/L (ref 134–144)
Total Protein: 7.3 g/dL (ref 6.0–8.5)

## 2020-09-05 LAB — LIPID PANEL
Chol/HDL Ratio: 4.9 ratio (ref 0.0–5.0)
Cholesterol, Total: 249 mg/dL — ABNORMAL HIGH (ref 100–199)
HDL: 51 mg/dL (ref >39)
LDL Chol Calc (NIH): 168 mg/dL — ABNORMAL HIGH (ref 0–99)
Triglycerides: 163 mg/dL — ABNORMAL HIGH (ref 0–149)
VLDL Cholesterol Cal: 30 mg/dL (ref 5–40)

## 2020-09-12 ENCOUNTER — Telehealth: Payer: Self-pay | Admitting: Emergency Medicine

## 2020-09-12 ENCOUNTER — Other Ambulatory Visit: Payer: Self-pay

## 2020-09-12 DIAGNOSIS — E782 Mixed hyperlipidemia: Secondary | ICD-10-CM

## 2020-09-12 MED ORDER — ROSUVASTATIN CALCIUM 20 MG PO TABS: 20.0000 mg | ORAL_TABLET | Freq: Every day | ORAL | 3 refills | Status: DC

## 2020-09-12 NOTE — Telephone Encounter (Signed)
Filled

## 2020-09-12 NOTE — Telephone Encounter (Signed)
Pt wife called and stated he has not received a call from the pharmacy that was suppose to increase pts Rx listed below to 20 mg form his last labs results. Pt would like a call when that is sent in.  rosuvastatin (CRESTOR) 10 MG tablet [[id number]]    Please advise.

## 2020-09-25 ENCOUNTER — Telehealth: Payer: Self-pay | Admitting: Emergency Medicine

## 2020-09-25 NOTE — Telephone Encounter (Signed)
Pts wife called stated she would like her husbands labs from 09/03/20 mailed to them. Please advise.

## 2020-09-26 NOTE — Telephone Encounter (Signed)
I have called pt and informed him that I have mailed the lab results to the pt. He stated understanding.

## 2020-10-17 ENCOUNTER — Telehealth: Payer: Self-pay | Admitting: Emergency Medicine

## 2020-10-17 NOTE — Telephone Encounter (Signed)
Pt was referred to GI for pain in the abdomen pain increasing and pt has still not been contact please check what is taking them so long to schedule him

## 2020-10-17 NOTE — Telephone Encounter (Signed)
10/17/2020 - PATIENT STATES HE SAW DR. Kittie Plater IN DEC. 2021 FOR ABDOMINAL PAIN. HE SAYS THE PAIN HAS GOTTEN WORSE. IT FEELS LIKE THE PAIN IS IN THE PIT OF HIS STOMACH. HE SAID SOMETHING IS NOT RIGHT AND HE IS GETTING VERY CONCERNED. HE ALSO SAID NO ONE HAS CONTACTED HIM ABOUT HAVING THE COLONOSCOPY OR PROSTATE TEST EITHER. BEST PHONE [phone] (CELL) Ricardo [name]

## 2020-10-29 ENCOUNTER — Encounter: Payer: Self-pay | Admitting: Physician Assistant

## 2020-11-14 ENCOUNTER — Ambulatory Visit: Payer: BC Managed Care – PPO | Admitting: Physician Assistant

## 2020-11-15 ENCOUNTER — Ambulatory Visit: Payer: BC Managed Care – PPO | Admitting: Physician Assistant

## 2020-11-15 ENCOUNTER — Encounter: Payer: Self-pay | Admitting: Physician Assistant

## 2020-11-15 VITALS — BP 118/84 | HR 76 | Ht 71.0 in | Wt 253.8 lb

## 2020-11-15 DIAGNOSIS — R194 Change in bowel habit: Secondary | ICD-10-CM | POA: Diagnosis not present

## 2020-11-15 DIAGNOSIS — R1031 Right lower quadrant pain: Secondary | ICD-10-CM

## 2020-11-15 DIAGNOSIS — R1013 Epigastric pain: Secondary | ICD-10-CM

## 2020-11-15 DIAGNOSIS — R142 Eructation: Secondary | ICD-10-CM

## 2020-11-15 DIAGNOSIS — R1032 Left lower quadrant pain: Secondary | ICD-10-CM

## 2020-11-15 MED ORDER — HYOSCYAMINE SULFATE 0.125 MG SL SUBL: 0.1250 mg | SUBLINGUAL_TABLET | Freq: Four times a day (QID) | SUBLINGUAL | 2 refills | Status: DC | PRN

## 2020-11-15 MED ORDER — PLENVU 140 G PO SOLR: ORAL | 0 refills | Status: DC

## 2020-11-15 NOTE — Patient Instructions (Signed)
If you are age 56 or older, your body mass index should be between 23-30. Your Body mass index is 35.4 kg/m. If this is out of the aforementioned range listed, please consider follow up with your Primary Care Provider.  If you are age 44 or younger, your body mass index should be between 19-25. Your Body mass index is 35.4 kg/m. If this is out of the aformentioned range listed, please consider follow up with your Primary Care Provider.   You have been scheduled for an endoscopy. Please follow written instructions given to you at your visit today. If you use inhalers (even only as needed), please bring them with you on the day of your procedure.  We have sent the following medications to your pharmacy for you to pick up at your convenience: Hyoscyamine   Use probiotic and restart fiver supplement daily.  Thank you for choosing me and [facility].  Ellouise Newer, PA-C

## 2020-11-15 NOTE — Progress Notes (Signed)
Chief Complaint: Abdominal pain and cramping, Change in bowel habits  HPI:    Ricardo [name] is a 55 year old male with a past medical history as listed below, who was referred to me by [name], * for a complaint of abdominal pain and cramping.      01/17/2019 CT abdomen pelvis with contrast with acute uncomplicated appendicitis.    09/04/2020 patient seen by PCP and at that time discussed intermittent generalized abdominal cramping for 7 days.  Had started taking Metamucil 2-3 times a day with good result with regular bowel movements.  At that time requested referral to a GI service.    09/04/2020 CMP and CBC normal.    Today, patient explains that he is "never sick", and for the past 7 weeks he has had abdominal issues.  Describes that his bowel habits are very inconsistent radiating from small soft stools to hard little stools.  Experiencing extreme abdominal cramping in his lower abdomen before a bowel movement but "then even my bowel movement is an easy".  This pain continues after that.  It occurs off and on throughout the day.  Due to this he has backed off of his regular eating and is trying to just be on liquids etc.  Also has noticed an increase in belching which "was never me".  Tells me he also has epigastric discomfort.  He has tried some old medication he had for stomach cramps for a few days which may be seemed to help a little bit, but is not taking this consistently anymore, only used for 3 days or so.  Also tried Advil and Ibuprofen "half a bottle in a week", to ease the pain but was told by one of his family members to use Tylenol instead.  Tells me over the past 24 hours he has felt better than he has in a long time, just 3 days ago he was thinking that "I just do not want a wake up anymore if it is going to be like this".    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    Patient goes through all of his history, apparently just prior to symptoms started he was having a sinus/cold congestion and on the same day as those symptoms started went to get his COVID booster shot.  He was drinking Tamiflu around that time.  Also describes that he had the foot of his bed elevated higher than his head for about 3 weeks and was unaware of this, not sure if he caused issues there.  Also tells me he has gained around 30 pounds over the past year or so, apparently was doing a lot of biking and biked across [place] to raise money a year or so ago and has stopped doing that recently.    Denies fever, chills, weight loss or blood in his stool.  Past Medical History:  Diagnosis Date  . Arthritis   . Family history of adverse reaction to anesthesia    mother had problem with it due to her asthma    Past Surgical History:  Procedure Laterality Date  . LAPAROSCOPIC APPENDECTOMY N/A 01/17/2019   Procedure: APPENDECTOMY LAPAROSCOPIC;  Surgeon: Ralene Ok, MD;  Location: [facility];  Service: General;  Laterality: N/A;  . SHOULDER SURGERY Left 09/19/2015    Current Outpatient Medications  Medication Sig Dispense Refill  . ibuprofen (ADVIL) 200 MG tablet Take 400 mg by mouth every 6 (six) hours as needed (for arthritis pain).     . polyethylene glycol (MIRALAX / GLYCOLAX) 17 g packet Take 17 g by mouth daily. (Patient not taking: Reported on 09/04/2020) 14 each 0  . rosuvastatin (CRESTOR) 20 MG tablet Take 1 tablet (20 mg total) by mouth daily. 90 tablet 3   No current facility-administered medications for this visit.    Allergies as of 11/15/2020  . (No Known Allergies)    Family History  Problem Relation Age of Onset  . Cancer Sister    . Cancer Brother   . High Cholesterol Brother     Social History   Socioeconomic History  . Marital status: Single    Spouse name: Not on file  . Number of children: Not on file  . Years of education: Not on file  . Highest education level: Not on file  Occupational History  . Not on file  Tobacco Use  . Smoking status: Never Smoker  . Smokeless tobacco: Never Used  Substance and Sexual Activity  . Alcohol use: Never    Comment: rare  . Drug use: Never  . Sexual activity: Not on file  Other Topics Concern  . Not on file  Social History Narrative  . Not on file   Social Determinants of Health   Financial Resource Strain: Not on file  Food Insecurity: Not on file  Transportation Needs: Not on file  Physical Activity: Not on file  Stress: Not on file  Social Connections: Not on file  Intimate Partner Violence: Not on file    Review of Systems:    Constitutional: No weight loss, fever or chills Skin: No rash  Cardiovascular: No chest pain Respiratory: No SOB  Gastrointestinal: See HPI and otherwise negative Genitourinary: No dysuria  Neurological: No headache, dizziness or syncope Musculoskeletal: No new muscle or joint pain Hematologic: No bleeding Psychiatric: No history of depression or anxiety   Physical Exam:  Vital signs: BP 118/84   Pulse 76   Ht 5\' 11"  (1.803 m)   Wt 253 lb 12.8 oz (115.1 kg)   SpO2 97%   BMI 35.40 kg/m   Constitutional:   Pleasant Caucasian male appears to be in NAD, Well developed, Well nourished, alert and cooperative Head:  Normocephalic and atraumatic. Eyes:   PEERL, EOMI. No icterus. Conjunctiva pink. Ears:  Normal auditory acuity. Neck:  Supple Throat: Oral cavity and pharynx without inflammation, swelling or lesion.  Respiratory: Respirations even and unlabored. Lungs clear to auscultation bilaterally.   No wheezes, crackles, or rhonchi.  Cardiovascular: Normal S1, S2. No MRG. Regular rate and rhythm. No peripheral  edema, cyanosis or pallor.  Gastrointestinal:  Soft, nondistended, mild right sided TTP. No rebound or guarding. Normal bowel sounds. No appreciable masses or hepatomegaly. Rectal:  Not performed.  Msk:  Symmetrical without gross deformities. Without edema, no deformity or joint abnormality.  Neurologic:  Alert and  oriented x4;  grossly normal neurologically.  Skin:   Dry and intact without significant lesions or rashes. Psychiatric: Demonstrates good judgement and reason without abnormal affect or behaviors.  RELEVANT LABS AND IMAGING: CBC    Component Value Date/Time  WBC 6.6 09/04/2020 0911   WBC 15.6 (H) 01/17/2019 1626   RBC 5.48 09/04/2020 0911   RBC 5.21 01/17/2019 1626   HGB 16.7 09/04/2020 0911   HCT 47.9 09/04/2020 0911   PLT 301 09/04/2020 0911   MCV 87 09/04/2020 0911   MCH 30.5 09/04/2020 0911   MCH 30.5 01/17/2019 1626   MCHC 34.9 09/04/2020 0911   MCHC 34.6 01/17/2019 1626   RDW 12.7 09/04/2020 0911   LYMPHSABS 1.2 09/04/2020 0911   MONOABS 0.9 01/17/2019 1626   EOSABS 0.2 09/04/2020 0911   BASOSABS 0.0 09/04/2020 0911    CMP     Component Value Date/Time   NA 138 09/04/2020 0911   K 4.6 09/04/2020 0911   CL 103 09/04/2020 0911   CO2 20 09/04/2020 0911   GLUCOSE 87 09/04/2020 0911   GLUCOSE 105 (H) 01/17/2019 1626   BUN 15 09/04/2020 0911   CREATININE 1.09 09/04/2020 0911   CALCIUM 9.6 09/04/2020 0911   PROT 7.3 09/04/2020 0911   ALBUMIN 4.6 09/04/2020 0911   AST 21 09/04/2020 0911   ALT 20 09/04/2020 0911   ALKPHOS 104 09/04/2020 0911   BILITOT 0.6 09/04/2020 0911   GFRNONAA 76 09/04/2020 0911   GFRAA 88 09/04/2020 0911    Assessment: 1.  Change in bowel habits: Towards constipation, previously was able to set a clock by his stools; consider IBS+/-weight gain versus other  2.  Eructations: With some epigastric discomfort; consider gastritis 3.  Lower abdominal cramping: Prior to a bowel movement; consider IBS versus other 4.  Epigastric  discomfort  Plan: 1.  Scheduled patient for an EGD and colonoscopy in the [place].  Patient is due for colonoscopy for screening and we will do a diagnostic EGD at the same time.  He is scheduled with Dr. Havery Moros.  Did provide the patient with a detailed list of risk for the procedure seen agrees to proceed. 2.  Encouraged the patient to continue his daily probiotic and restart his fiber supplement which can help with variance in stools. 3.  Reviewed recent labs including a CBC and CMP which were normal. 4.  Prescribed Hyoscyamine sulfate 0.125 mg sublingual tabs every 4-6 hours as needed for abdominal cramping.  Also discussed he could try taking one of these before going out to dinner to see if this helps with his urgent stools afterwards.  Discussed there may be an aspect of irritable bowel playing a role in all of his symptoms recently. 5.  Explained to patient if EGD and colon are unrevealing then could consider a CT of the abdomen and pelvis. 6.  Patient to follow in clinic per recommendations from Dr. Havery Moros after time of procedures.  Ellouise Newer, PA-C [facility] 11/15/2020, 2:33 PM  Cc: [name], *

## 2020-11-17 NOTE — Progress Notes (Signed)
Agree with assessment and plan as outlined.  

## 2020-11-27 ENCOUNTER — Encounter (HOSPITAL_COMMUNITY): Payer: Self-pay | Admitting: Family Medicine

## 2020-11-27 ENCOUNTER — Inpatient Hospital Stay (HOSPITAL_COMMUNITY)
Admission: EM | Admit: 2020-11-27 | Discharge: 2020-12-03 | DRG: 357 | Disposition: A | Discharge status: Home / Self Care | Payer: BC Managed Care – PPO | Attending: Family Medicine | Admitting: Family Medicine

## 2020-11-27 ENCOUNTER — Inpatient Hospital Stay (HOSPITAL_COMMUNITY): Payer: BC Managed Care – PPO

## 2020-11-27 ENCOUNTER — Emergency Department (HOSPITAL_COMMUNITY): Payer: BC Managed Care – PPO

## 2020-11-27 ENCOUNTER — Other Ambulatory Visit: Payer: Self-pay

## 2020-11-27 DIAGNOSIS — R590 Localized enlarged lymph nodes: Secondary | ICD-10-CM

## 2020-11-27 DIAGNOSIS — R109 Unspecified abdominal pain: Secondary | ICD-10-CM

## 2020-11-27 DIAGNOSIS — M1990 Unspecified osteoarthritis, unspecified site: Secondary | ICD-10-CM | POA: Diagnosis present

## 2020-11-27 DIAGNOSIS — D62 Acute posthemorrhagic anemia: Secondary | ICD-10-CM | POA: Diagnosis not present

## 2020-11-27 DIAGNOSIS — Z6834 Body mass index (BMI) 34.0-34.9, adult: Secondary | ICD-10-CM | POA: Diagnosis not present

## 2020-11-27 DIAGNOSIS — K529 Noninfective gastroenteritis and colitis, unspecified: Secondary | ICD-10-CM | POA: Diagnosis present

## 2020-11-27 DIAGNOSIS — R112 Nausea with vomiting, unspecified: Secondary | ICD-10-CM

## 2020-11-27 DIAGNOSIS — Z83438 Family history of other disorder of lipoprotein metabolism and other lipidemia: Secondary | ICD-10-CM

## 2020-11-27 DIAGNOSIS — E785 Hyperlipidemia, unspecified: Secondary | ICD-10-CM

## 2020-11-27 DIAGNOSIS — K56609 Unspecified intestinal obstruction, unspecified as to partial versus complete obstruction: Secondary | ICD-10-CM

## 2020-11-27 DIAGNOSIS — K3580 Unspecified acute appendicitis: Secondary | ICD-10-CM | POA: Diagnosis present

## 2020-11-27 DIAGNOSIS — Z20822 Contact with and (suspected) exposure to covid-19: Secondary | ICD-10-CM | POA: Diagnosis present

## 2020-11-27 DIAGNOSIS — E669 Obesity, unspecified: Secondary | ICD-10-CM | POA: Diagnosis present

## 2020-11-27 DIAGNOSIS — C786 Secondary malignant neoplasm of retroperitoneum and peritoneum: Secondary | ICD-10-CM | POA: Diagnosis present

## 2020-11-27 DIAGNOSIS — C179 Malignant neoplasm of small intestine, unspecified: Secondary | ICD-10-CM | POA: Diagnosis present

## 2020-11-27 DIAGNOSIS — Z803 Family history of malignant neoplasm of breast: Secondary | ICD-10-CM | POA: Diagnosis not present

## 2020-11-27 DIAGNOSIS — R1084 Generalized abdominal pain: Secondary | ICD-10-CM | POA: Diagnosis present

## 2020-11-27 HISTORY — DX: Hyperlipidemia, unspecified: E78.5

## 2020-11-27 LAB — LIPASE, BLOOD: Lipase: 34 U/L (ref 11–51)

## 2020-11-27 LAB — CBC
HCT: 44.9 % (ref 39.0–52.0)
HCT: 43.6 % (ref 39.0–52.0)
Hemoglobin: 15.7 g/dL (ref 13.0–17.0)
Hemoglobin: 14.7 g/dL (ref 13.0–17.0)
MCH: 30.4 pg (ref 26.0–34.0)
MCH: 29.8 pg (ref 26.0–34.0)
MCHC: 35 g/dL (ref 30.0–36.0)
MCHC: 33.7 g/dL (ref 30.0–36.0)
MCV: 86.8 fL (ref 80.0–100.0)
MCV: 88.4 fL (ref 80.0–100.0)
Platelets: 330 10*3/uL (ref 150–400)
Platelets: 276 10*3/uL (ref 150–400)
RBC: 5.17 MIL/uL (ref 4.22–5.81)
RBC: 4.93 MIL/uL (ref 4.22–5.81)
RDW: 12.7 % (ref 11.5–15.5)
RDW: 12.7 % (ref 11.5–15.5)
WBC: 12.8 10*3/uL — ABNORMAL HIGH (ref 4.0–10.5)
WBC: 11 10*3/uL — ABNORMAL HIGH (ref 4.0–10.5)
nRBC: 0 % (ref 0.0–0.2)
nRBC: 0 % (ref 0.0–0.2)

## 2020-11-27 LAB — URINALYSIS, ROUTINE W REFLEX MICROSCOPIC
Bilirubin Urine: NEGATIVE
Glucose, UA: NEGATIVE mg/dL
Hgb urine dipstick: NEGATIVE
Ketones, ur: 5 mg/dL — AB
Leukocytes,Ua: NEGATIVE
Nitrite: NEGATIVE
Protein, ur: NEGATIVE mg/dL
Specific Gravity, Urine: >1.046 — ABNORMAL HIGH (ref 1.005–1.030)
pH: 5 (ref 5.0–8.0)

## 2020-11-27 LAB — COMPREHENSIVE METABOLIC PANEL
ALT: 21 U/L (ref 0–44)
AST: 22 U/L (ref 15–41)
Albumin: 4.3 g/dL (ref 3.5–5.0)
Alkaline Phosphatase: 85 U/L (ref 38–126)
Anion gap: 8 (ref 5–15)
BUN: 11 mg/dL (ref 6–20)
CO2: 25 mmol/L (ref 22–32)
Calcium: 9.5 mg/dL (ref 8.9–10.3)
Chloride: 103 mmol/L (ref 98–111)
Creatinine, Ser: 1.25 mg/dL — ABNORMAL HIGH (ref 0.61–1.24)
GFR, Estimated: >60 mL/min (ref >60)
Glucose, Bld: 112 mg/dL — ABNORMAL HIGH (ref 70–99)
Potassium: 4.3 mmol/L (ref 3.5–5.1)
Sodium: 136 mmol/L (ref 135–145)
Total Bilirubin: 0.9 mg/dL (ref 0.3–1.2)
Total Protein: 8 g/dL (ref 6.5–8.1)

## 2020-11-27 LAB — HIV ANTIBODY (ROUTINE TESTING W REFLEX): HIV Screen 4th Generation wRfx: NONREACTIVE

## 2020-11-27 LAB — CREATININE, SERUM
Creatinine, Ser: 1.16 mg/dL (ref 0.61–1.24)
GFR, Estimated: >60 mL/min (ref >60)

## 2020-11-27 LAB — SARS CORONAVIRUS 2 (TAT 6-24 HRS): SARS Coronavirus 2: NEGATIVE

## 2020-11-27 MED ORDER — ONDANSETRON 4 MG PO TBDP
4.0000 mg | ORAL_TABLET | Freq: Once | ORAL | Status: AC | PRN
  Administered 2020-11-27: 4 mg via ORAL
  Filled 2020-11-27: qty 1

## 2020-11-27 MED ORDER — LACTATED RINGERS IV SOLN: INTRAVENOUS | Status: DC

## 2020-11-27 MED ORDER — PIPERACILLIN-TAZOBACTAM 3.375 G IVPB
3.3750 g | Freq: Three times a day (TID) | INTRAVENOUS | Status: DC
  Administered 2020-11-27 – 2020-11-29 (×7): 3.375 g via INTRAVENOUS
  Filled 2020-11-27 (×8): qty 50

## 2020-11-27 MED ORDER — ACETAMINOPHEN 650 MG RE SUPP: 650.0000 mg | Freq: Four times a day (QID) | RECTAL | Status: DC | PRN

## 2020-11-27 MED ORDER — SODIUM CHLORIDE 0.9 % IV BOLUS
1000.0000 mL | Freq: Once | INTRAVENOUS | Status: AC
  Administered 2020-11-27: 1000 mL via INTRAVENOUS

## 2020-11-27 MED ORDER — SODIUM CHLORIDE 0.9 % IV SOLN
12.5000 mg | Freq: Once | INTRAVENOUS | Status: AC
  Administered 2020-11-27: 12.5 mg via INTRAVENOUS
  Filled 2020-11-27: qty 0.5

## 2020-11-27 MED ORDER — IOHEXOL 350 MG/ML SOLN
100.0000 mL | Freq: Once | INTRAVENOUS | Status: AC | PRN
  Administered 2020-11-27: 100 mL via INTRAVENOUS

## 2020-11-27 MED ORDER — HYDROMORPHONE HCL 1 MG/ML IJ SOLN
1.0000 mg | Freq: Once | INTRAMUSCULAR | Status: AC
  Administered 2020-11-27: 1 mg via INTRAVENOUS
  Filled 2020-11-27: qty 1

## 2020-11-27 MED ORDER — SODIUM CHLORIDE 0.9 % IV SOLN: 12.5000 mg | Freq: Once | INTRAVENOUS | Status: DC

## 2020-11-27 MED ORDER — ONDANSETRON HCL 4 MG/2ML IJ SOLN
4.0000 mg | Freq: Once | INTRAMUSCULAR | Status: AC
  Administered 2020-11-27: 4 mg via INTRAVENOUS
  Filled 2020-11-27: qty 2

## 2020-11-27 MED ORDER — ONDANSETRON HCL 4 MG PO TABS: 4.0000 mg | ORAL_TABLET | Freq: Four times a day (QID) | ORAL | Status: DC | PRN

## 2020-11-27 MED ORDER — OXYCODONE-ACETAMINOPHEN 5-325 MG PO TABS
1.0000 | ORAL_TABLET | ORAL | Status: DC | PRN
  Administered 2020-11-27: 1 via ORAL
  Filled 2020-11-27 (×2): qty 1

## 2020-11-27 MED ORDER — HYDROMORPHONE HCL 1 MG/ML IJ SOLN
1.0000 mg | INTRAMUSCULAR | Status: DC | PRN
Start: 2020-11-27 — End: 2020-11-29
  Administered 2020-11-27 – 2020-11-29 (×10): 1 mg via INTRAVENOUS
  Filled 2020-11-27 (×10): qty 1

## 2020-11-27 MED ORDER — ACETAMINOPHEN 325 MG PO TABS: 650.0000 mg | ORAL_TABLET | Freq: Four times a day (QID) | ORAL | Status: DC | PRN

## 2020-11-27 MED ORDER — SODIUM CHLORIDE 0.9 % IV SOLN
2.0000 g | Freq: Once | INTRAVENOUS | Status: AC
  Administered 2020-11-27: 2 g via INTRAVENOUS
  Filled 2020-11-27: qty 20

## 2020-11-27 MED ORDER — METRONIDAZOLE IN NACL 5-0.79 MG/ML-% IV SOLN
500.0000 mg | Freq: Once | INTRAVENOUS | Status: AC
  Administered 2020-11-27: 500 mg via INTRAVENOUS
  Filled 2020-11-27: qty 100

## 2020-11-27 MED ORDER — ONDANSETRON HCL 4 MG/2ML IJ SOLN
4.0000 mg | Freq: Four times a day (QID) | INTRAMUSCULAR | Status: DC | PRN
  Administered 2020-11-29: 4 mg via INTRAVENOUS
  Filled 2020-11-27: qty 2

## 2020-11-27 MED ORDER — ENOXAPARIN SODIUM 40 MG/0.4ML ~~LOC~~ SOLN
40.0000 mg | Freq: Every day | SUBCUTANEOUS | Status: DC
  Administered 2020-11-27 – 2020-11-28 (×2): 40 mg via SUBCUTANEOUS
  Filled 2020-11-27 (×2): qty 0.4

## 2020-11-27 MED ORDER — SODIUM CHLORIDE 0.9 % IV BOLUS
500.0000 mL | Freq: Once | INTRAVENOUS | Status: AC
  Administered 2020-11-27: 500 mL via INTRAVENOUS

## 2020-11-27 MED ORDER — LIDOCAINE VISCOUS HCL 2 % MT SOLN
15.0000 mL | Freq: Once | OROMUCOSAL | Status: AC
  Administered 2020-11-27: 15 mL via OROMUCOSAL
  Filled 2020-11-27: qty 15

## 2020-11-27 NOTE — ED Triage Notes (Signed)
Assume care from EMS,EMS states CC of abd pain. EMS states pt had an emergency appy done 2 years ago and 8 weeks ago been having abd pain and constipation and took old pain meds from his surgery and had an BM yesterday. EMS states pt seen his PCP who referred him to an GI who schedule an ENDO. EMS states pt was taking hyosycamine with no relief

## 2020-11-27 NOTE — ED Notes (Signed)
Wife [name] [phone] would like an update when available

## 2020-11-27 NOTE — ED Notes (Signed)
Attempted NG tube insertion - pt pulled out NG tube during insertion. Pt requested a break before trying again. Will try again in 30 minutes.

## 2020-11-27 NOTE — ED Notes (Signed)
Attempted to give reportx1 

## 2020-11-27 NOTE — Consult Note (Signed)
Consulting Physician: Nickola Major Stechschulte  Referring Provider: Abigail Butts, PA (EDP)  Chief Complaint: Abdominal pain  Reason for Consult: Possible SBO vs. ileitis   Subjective   HPI: Ricardo [name] is an 55 y.o. male who is here for abdominal pain.  He has had many weeks of abdominal pain and was evaluated by Dr. Havery Moros (gastroenterology) on 11/15/2020, for similar complaints as an outpatient.  They had plans for an EGD and colonoscopy as an outpatient.  He has crampy abdominal pain in his mid abdomen that comes and goes.  The pain decided not to let up today so he presented ot the ED.  Past Medical History:  Diagnosis Date   Arthritis    Family history of adverse reaction to anesthesia    mother had problem with it due to her asthma    Past Surgical History:  Procedure Laterality Date   LAPAROSCOPIC APPENDECTOMY N/A 01/17/2019   Procedure: APPENDECTOMY LAPAROSCOPIC;  Surgeon: Ralene Ok, MD;  Location: [place];  Service: General;  Laterality: N/A;   SHOULDER SURGERY Left 09/19/2015    Family History  Problem Relation Age of Onset   Cancer Sister    Cancer Brother    High Cholesterol Brother    Colon cancer Neg Hx    Pancreatic cancer Neg Hx    Liver disease Neg Hx    Esophageal cancer Neg Hx    Stomach cancer Neg Hx     Social:  reports that he has never smoked. He has never used smokeless tobacco. He reports previous alcohol use. He reports that he does not use drugs.  Allergies: No Known Allergies  Medications: Current Outpatient Medications  Medication Instructions   hyoscyamine (LEVSIN SL) 0.125 mg, Sublingual, Every 6 hours PRN, Take one tablet every  4-6 hours as needed for abdominal cramping   ibuprofen (ADVIL) 400 mg, Oral, Every 6 hours PRN   PEG-KCl-NaCl-NaSulf-Na Asc-C (PLENVU) 140 g SOLR Use as directed   rosuvastatin (CRESTOR) 20 mg, Oral, Daily    ROS - all of the below systems have been reviewed with the patient and positives are  indicated with bold text General: chills, fever or night sweats Eyes: blurry vision or double vision ENT: epistaxis or sore throat Allergy/Immunology: itchy/watery eyes or nasal congestion Hematologic/Lymphatic: bleeding problems, blood clots or swollen lymph nodes Endocrine: temperature intolerance or unexpected weight changes Breast: new or changing breast lumps or nipple discharge Resp: cough, shortness of breath, or wheezing CV: chest pain or dyspnea on exertion GI: as per HPI GU: dysuria, trouble voiding, or hematuria MSK: joint pain or joint stiffness Neuro: TIA or stroke symptoms Derm: pruritus and skin lesion changes Psych: anxiety and depression  Objective   PE Blood pressure 129/86, pulse 80, temperature 98.1 F (36.7 C), temperature source Oral, resp. rate (!) 22, height 5\' 11"  (1.803 m), weight 111.1 kg, SpO2 93 %. Constitutional: NAD; conversant; no deformities Eyes: Moist conjunctiva; no lid lag; anicteric; PERRL Neck: Trachea midline; no thyromegaly Lungs: Normal respiratory effort; no tactile fremitus CV: RRR; no palpable thrills; no pitting edema GI: Abd Soft, moderate diffuse tenderness, no specific spot of tenderness; no palpable hepatosplenomegaly MSK: Normal range of motion of extremities; no clubbing/cyanosis Psychiatric: Appropriate affect; alert and oriented x3 Lymphatic: No palpable cervical or axillary lymphadenopathy  Results for orders placed or performed during the hospital encounter of 11/27/20 (from the past 24 hour(s))  Lipase, blood     Status: None   Collection Time: 11/27/20 12:22 AM  Result  Value Ref Range   Lipase 34 11 - 51 U/L  Comprehensive metabolic panel     Status: Abnormal   Collection Time: 11/27/20 12:22 AM  Result Value Ref Range   Sodium 136 135 - 145 mmol/L   Potassium 4.3 3.5 - 5.1 mmol/L   Chloride 103 98 - 111 mmol/L   CO2 25 22 - 32 mmol/L   Glucose, Bld 112 (H) 70 - 99 mg/dL   BUN 11 6 - 20 mg/dL   Creatinine, Ser  1.25 (H) 0.61 - 1.24 mg/dL   Calcium 9.5 8.9 - 10.3 mg/dL   Total Protein 8.0 6.5 - 8.1 g/dL   Albumin 4.3 3.5 - 5.0 g/dL   AST 22 15 - 41 U/L   ALT 21 0 - 44 U/L   Alkaline Phosphatase 85 38 - 126 U/L   Total Bilirubin 0.9 0.3 - 1.2 mg/dL   GFR, Estimated >60 >60 mL/min   Anion gap 8 5 - 15  CBC     Status: Abnormal   Collection Time: 11/27/20 12:22 AM  Result Value Ref Range   WBC 12.8 (H) 4.0 - 10.5 K/uL   RBC 5.17 4.22 - 5.81 MIL/uL   Hemoglobin 15.7 13.0 - 17.0 g/dL   HCT 44.9 39.0 - 52.0 %   MCV 86.8 80.0 - 100.0 fL   MCH 30.4 26.0 - 34.0 pg   MCHC 35.0 30.0 - 36.0 g/dL   RDW 12.7 11.5 - 15.5 %   Platelets 330 150 - 400 K/uL   nRBC 0.0 0.0 - 0.2 %     Imaging Orders     CT Angio Chest/Abd/Pel for Dissection W and/or Wo Contrast   Assessment and Plan   Ricardo [name] is an 55 y.o. male with abdominal pain concerning for small bowel obstruction vs. Ileitis.  A CT angiogram was completed which demonstrated no mesenteric vessel disease to explain his complaints.  The CT shows some dilation of mid to distal small bowel concerning for ileitis or small bowel obstruction, however due to contrast timing for the angiogram, this is difficult to make out on CT.  I recommend repeating the CT with PO and IV contrast after NG tube decompression to better evaluate for a surgical issue with transition point representing an obstruction (which he may have from adhesions around his appendectomy procedure), vs a medical issue like ileitis that may benefit from colonoscopy.   Felicie Morn, MD  [facility] Use [email or web address] to contact on call provider

## 2020-11-27 NOTE — H&P (Signed)
History and Physical    Ricardo [name]:[id number] DOB: 08/07/1966 DOA: 11/27/2020  PCP: [name], MD   Patient coming from: Home  Chief Complaint: Abdominal pain for past 2 months that is worsening.   HPI: Ricardo [name] is a 55 y.o. male with medical history significant for HLD who presents for evaluation of abdominal pain that has progressively worsened and became severe yesterday (11/26/20) afternoon. Patient reports pain is severe at 10/10 and he described as ripping and tearing.  Pain radiates to the left upper back.  Patient reports he has not passed gas or had a bowel movement since 3 PM on 11/26/20.  He does report frequent burping. He also developed nausea and has vomited a few times. Patient states he has had generalized abdominal pain and cramping over the last 8 weeks. He has been taking Tylenol and hyoscyamine which was initially helping the pain but  it would always return.  Patient reports he has never had pain like this and previous pain over the last 8 weeks has not been constant like it is today.  Patient reports that lying on his right side makes the symptoms significantly worse.  He reports that previously getting up and walking helped his symptoms however today that makes them worse. Lying on his left side only briefly improves the pain.  Patient denies fever, chills, headache, neck pain, chest pain, shortness of breath, numbness, weakness. He states he did feel light headed last evening when he would stand up but he did not have LOC.  He denies a history of hypertension or smoking. He states he has always been healthy and very active. He rides bicycle 25-40 miles a day 3-4 days a week until 8 weeks ago when the pain started and he didn't feel he could.   ED Course:  Pt is hemodynamically stable. Has mild leukocytosis with [place]BC of [address]. CTA chest, abdomen and pelvis revealed thickening of the terminal ileum with proximal mid to distal small bowel obstruction.  Concern  for ileitis versus malignancy.  There is a 1.3 cm right lower quadrant lymphadenopathy present.  Patient is to get the surgery came to evaluate the patient and requested a CT of the abdomen pelvis with IV contrast for better visualization to determine if surgical intervention is required.  The requested hospitalist admit patient for further work-up.  He has required IV pain medication and antiemetics.  Review of Systems:  General: Denies fever, chills, weight loss, night sweats. HENT: Denies head trauma, headache, denies change in hearing, tinnitus.  Denies nasal congestion or bleeding.  Denies sore throat, sores in mouth.  Denies difficulty swallowing Eyes: Denies blurry vision, pain in eye, drainage.  Denies discoloration of eyes. Neck: Denies pain.  Denies swelling.  Denies pain with movement. Cardiovascular: Denies chest pain, palpitations.  Denies edema.  Denies orthopnea Respiratory: Denies shortness of breath, cough.  Denies wheezing.  Denies sputum production Gastrointestinal: reports abdominal pain and swelling. Reports nausea, vomiting. Denies diarrhea.  Denies melena.  Denies hematemesis. Musculoskeletal: Denies limitation of movement. Denies deformity or swelling. Denies pain. Denies arthralgias  Genitourinary: Denies pelvic pain.  Denies urinary frequency or hesitancy.  Denies dysuria.  Skin: Denies rash.  Denies petechiae, purpura, ecchymosis. Neurological: Denies headache. Denies syncope. Denies seizure activity. Denies paresthesia.  Denies slurred speech, drooping face.  Denies visual change. Psychiatric: Denies depression, anxiety.  Denies hallucinations.  Past Medical History:  Diagnosis Date  . Arthritis   . Family history of adverse reaction to  anesthesia    mother had problem with it due to her asthma    Past Surgical History:  Procedure Laterality Date  . LAPAROSCOPIC APPENDECTOMY N/A 01/17/2019   Procedure: APPENDECTOMY LAPAROSCOPIC;  Surgeon: Ralene Ok, MD;   Location: [place];  Service: General;  Laterality: N/A;  . SHOULDER SURGERY Left 09/19/2015    Social History  reports that he has never smoked. He has never used smokeless tobacco. He reports previous alcohol use. He reports that he does not use drugs.  No Known Allergies  Family History  Problem Relation Age of Onset  . Cancer Sister   . Cancer Brother   . High Cholesterol Brother   . Colon cancer Neg Hx   . Pancreatic cancer Neg Hx   . Liver disease Neg Hx   . Esophageal cancer Neg Hx   . Stomach cancer Neg Hx      Prior to Admission medications   Medication Sig Start Date End Date Taking? Authorizing Provider  hyoscyamine (LEVSIN SL) 0.125 MG SL tablet Place 1 tablet (0.125 mg total) under the tongue every 6 (six) hours as needed. Take one tablet every  4-6 hours as needed for abdominal cramping 11/15/20   Levin Erp, PA  ibuprofen (ADVIL) 200 MG tablet Take 400 mg by mouth every 6 (six) hours as needed (for arthritis pain).     [provider]  PEG-KCl-NaCl-NaSulf-Na Asc-C (PLENVU) 140 g SOLR Use as directed 11/15/20   Levin Erp, PA  rosuvastatin (CRESTOR) 20 MG tablet Take 1 tablet (20 mg total) by mouth daily. 09/12/20   [name], MD    Physical Exam: Vitals:   11/27/20 0400 11/27/20 0415 11/27/20 0445 11/27/20 0515  BP: 129/86 129/86 133/84 123/83  Pulse: 80 77 84 71  Resp: (!) 22 14 20  (!) 23  Temp:      TempSrc:      SpO2: 93% 90% 93% 94%  Weight:      Height:        Constitutional: NAD, calm, comfortable Vitals:   11/27/20 0400 11/27/20 0415 11/27/20 0445 11/27/20 0515  BP: 129/86 129/86 133/84 123/83  Pulse: 80 77 84 71  Resp: (!) 22 14 20  (!) 23  Temp:      TempSrc:      SpO2: 93% 90% 93% 94%  Weight:      Height:       General: WDWN, Alert and oriented x3.  Eyes: EOMI, PERRL, conjunctivae normal.  Sclera nonicteric HENT:  [place]/AT, external ears normal.  Nares patent without epistasis.  Mucous membranes are  dry  Neck: Soft, normal range of motion, supple, no masses, no thyromegaly. Trachea midline Respiratory: clear to auscultation bilaterally, no wheezing, no crackles. Normal respiratory effort. No accessory muscle use.  Cardiovascular: Regular rate and rhythm, no murmurs / rubs / gallops. No extremity edema. 2+ radial pulses bilaterally Abdomen: Soft, Periumbilical and RLQ abdominal tenderness to palpation, distended, no rebound or guarding.  No masses palpated. Bowel sounds hyperactive. No CVA tenderness to percussion Musculoskeletal: FROM. no cyanosis. No joint deformity upper and lower extremities. Normal muscle tone.  Skin: Warm, dry, intact no rashes, lesions, ulcers. No induration Neurologic: CN 2-12 grossly intact. Normal speech. Sensation intact. Strength 5/5 in all extremities.   Psychiatric: Normal judgment and insight.  Normal mood.    Labs on Admission: I have personally reviewed following labs and imaging studies  CBC: Recent Labs  Lab 11/27/20 0022  WBC 12.8*  HGB 15.7  HCT 44.9  MCV 86.8  PLT 979    Basic Metabolic Panel: Recent Labs  Lab 11/27/20 0022  NA 136  K 4.3  CL 103  CO2 25  GLUCOSE 112*  BUN 11  CREATININE 1.25*  CALCIUM 9.5    GFR: Estimated Creatinine Clearance: 84.6 mL/min (A) (by C-G formula based on SCr of 1.25 mg/dL (H)).  Liver Function Tests: Recent Labs  Lab 11/27/20 0022  AST 22  ALT 21  ALKPHOS 85  BILITOT 0.9  PROT 8.0  ALBUMIN 4.3    Urine analysis: No results found for: COLORURINE, APPEARANCEUR, LABSPEC, PHURINE, GLUCOSEU, HGBUR, BILIRUBINUR, KETONESUR, PROTEINUR, UROBILINOGEN, NITRITE, LEUKOCYTESUR  Radiological Exams on Admission: CT Angio Chest/Abd/Pel for Dissection W and/or Wo Contrast  Result Date: 11/27/2020 CLINICAL DATA:  Abdominal aortic dissection suspected. Presenting with abdominal pain. EXAM: CT ANGIOGRAPHY CHEST, ABDOMEN AND PELVIS TECHNIQUE: Non-contrast CT of the chest was initially obtained.  Multidetector CT imaging through the chest, abdomen and pelvis was performed using the standard protocol during bolus administration of intravenous contrast. Multiplanar reconstructed images and MIPs were obtained and reviewed to evaluate the vascular anatomy. CONTRAST:  114mL OMNIPAQUE IOHEXOL 350 MG/ML SOLN COMPARISON:  CT abdomen 01/17/2019. FINDINGS: CTA CHEST FINDINGS Cardiovascular: Preferential opacification of the thoracic aorta. No evidence of thoracic aortic aneurysm or dissection. Mild atherosclerotic plaque of the thoracic aorta. Normal heart size. No significant pericardial effusion. At least mild four-vessel coronary artery calcifications. The main pulmonary artery is normal in caliber. Mediastinum/Nodes: No enlarged mediastinal, hilar, or axillary lymph nodes. Thyroid gland, trachea, and esophagus demonstrate no significant findings. Lungs/Pleura: Bilateral lower lobe subsegmental atelectasis. Linear atelectasis within the lingula. Subpleural right micronodule that appears to be triangular in morphology likely represents an intrapulmonary lymph node (7:75). No pulmonary mass. No focal consolidation. No pleural effusion. No pneumothorax. Musculoskeletal: No chest wall abnormality. No suspicious lytic or blastic osseous lesions. No acute displaced fracture. CTA ABDOMEN AND PELVIS FINDINGS VASCULAR Aorta: Mild atherosclerotic plaque. Normal caliber aorta without aneurysm, dissection, vasculitis or significant stenosis. Celiac: Patent without evidence of aneurysm, dissection, vasculitis or significant stenosis. SMA: Patent without evidence of aneurysm, dissection, vasculitis or significant stenosis. Renals: Both renal arteries are patent without evidence of aneurysm, dissection, vasculitis, fibromuscular dysplasia or significant stenosis. IMA: Patent without evidence of aneurysm, dissection, vasculitis or significant stenosis. Inflow: Mild atherosclerotic plaque. Patent without evidence of aneurysm,  dissection, vasculitis or significant stenosis. Veins: No obvious venous abnormality within the limitations of this arterial phase study. NON-VASCULAR Hepatobiliary: An 8 mm hypodensity within the right hepatic lobe is similar to prior likely represents a hepatic hemangioma. No new focal liver abnormality. No gallstones, gallbladder wall thickening, or pericholecystic fluid. No biliary dilatation. Pancreas: No focal lesion. Normal pancreatic contour. No surrounding inflammatory changes. No main pancreatic ductal dilatation. Spleen: Normal in size without focal abnormality. Adrenals/Urinary Tract: No adrenal nodule bilaterally. Bilateral kidneys enhance symmetrically. No hydronephrosis. No hydroureter. The urinary bladder is unremarkable. Stomach/Bowel: Stomach is within normal limits. Thickening of the terminal ileum. Multiple loops of mid to distal small bowel within the anterior mid and lower abdomen are distended with fluid with some air-fluid levels noted. Surrounding mesenteric edema is noted. No evidence of large bowel wall thickening or dilatation. Scattered colonic diverticulosis. No pneumatosis. Status post appendectomy. Lymphatic: Right lower quadrant lymphadenopathy with as an example a 1.3 cm lymph node (6:197). Reproductive: Prostate is unremarkable. Other: Trace simple free fluid within the lower abdomen and pelvis. No intraperitoneal free gas. No organized fluid collection. Musculoskeletal:  No abdominal wall hernia or abnormality. No suspicious lytic or blastic osseous lesions. No acute displaced fracture. Review of the MIP images confirms the above findings. IMPRESSION: 1. Thickening of the terminal ileum with associated proximal mid to distal small bowel obstruction. Findings could be due to an ileitis versus malignancy. Nonspecific mesenteric edema could be due to engorgement versus metastases. No associated bowel perforation. Recommend endoscopy for further evaluation. 2. Indeterminate right lower  quadrant lymphadenopathy. 3. Scattered colonic diverticulosis with no acute diverticulitis. 4. Stable right hepatic lobe subcentimeter hyperdensity likely represents a hepatic hemangioma. 5. No acute vascular abnormality. Aortic Atherosclerosis (ICD10-I70.0) - mild. 6. No acute intrathoracic abnormality. Electronically Signed   By: Iven Finn M.D.   On: 11/27/2020 03:07    Assessment/Plan Principal Problem:   SBO (small bowel obstruction)  Ricardo [name] is admitted to Med/Surg floor.  Surgery has evaluated patient and requested hospitalist admission.  They reviewed ordered a repeat CT of the abdomen pelvis with contrast to get a better visualization of the small bowel to be able to discern obstruction versus possible malignancy. NG tube to low intermittent wall suction placed Pain control with Dilaudid every 3 hours as needed.  Antiemetics provided. IV fluid hydration with LR at 125 ml/hr provided.  Active Problems:   Ileitis Patient was given dose of Rocephin and Flagyl in the emergency room to cover for infectious ileitis. Will use Zosyn for possible intra-abdominal infection while work-up is being finalized. Patient does have mildly elevated leukocytosis with a WBC of 13,000.  Could be from infection versus left shift from nausea and vomiting    Intractable abdominal pain Patient has had abdominal pain for the last 8 weeks which is gotten worse in the last 2 to 3 days. Pain control provided    Nausea and vomiting Antiemetic provided as needed.  IV fluid hydration provided    Lymphadenopathy RLQ And with right lower quadrant lymphadenopathy 1.3 cm which is concerning for possible metastasis versus infectious etiology.  If CT of the abdomen pelvis with contrast reveals mass patient will need biopsy     Hyperlipidemia Patient is on Crestor which will be resumed when patient is able to tolerate p.o. intake   DVT prophylaxis: Lovenox for DVT prophylaxis Code Status:   Full  code Family Communication:  Diagnosis and plan discussed with patient and his son who is at the bedside.  Questions answered.  They verbalized understanding and agree with plan.  Further recommendations to follow as clinically indicated Disposition Plan:   Patient is from:  Home  Anticipated DC to:  Home  Anticipated DC date:  Anticipate greater than 2 midnight stay in the hospital to treat acute condition  Anticipated DC barriers: No barriers to discharge identified at this time  Consults called:  Surgery, Dr. Thermon Leyland Admission status:  Inpatient.   Yevonne Aline Ricardo Longton MD [facility]  How to contact the [facility] Attending or Consulting provider [place] or covering provider during after hours [place], for this patient?   1. Check the care team in [facility] and look for a) attending/consulting [facility] provider listed and b) the [facility] team listed 2. Log into [email or web address] and use 's universal password to access. If you do not have the password, please contact the hospital operator. 3. Locate the [facility] provider you are looking for under [facility] and page to a number that you can be directly reached. 4. If you still have difficulty reaching the provider, please page the [facility] (Director on  Call) for the Hospitalists listed on amion for assistance.  11/27/2020, 5:49 AM

## 2020-11-27 NOTE — ED Provider Notes (Signed)
[facility] EMERGENCY DEPARTMENT Provider Note   CSN: [id number] Arrival date & time: 11/27/20  0011     History Chief Complaint  Patient presents with  . Abdominal Pain  . Nausea    Ricardo [name] is a 55 y.o. male  presents to the Emergency Department complaining of gradual, persistent, progressively worsening abdominal pain onset 3 PM.  Patient reports pain is severe 10/10 described as ripping and tearing.  Pain radiates to the left upper back.  Patient reports he has not passed gas or had a bowel movement since 3 PM.  He does report associated burping.  Patient reports he has had problems with generalized abdominal pain and cramping over the last 8 weeks.  He has been taking Tylenol and hyoscyamine which helps pain initially but then it returns.  Patient reports he has never had pain like this and previous pain over the last 8 weeks has not been constant like this either.  Patient reports that lying on his right side makes the symptoms significantly worse.  He reports that previously getting up and walking helped his symptoms however today that makes them worse.  Lying on his left side only briefly improves the pain.  No treatments prior to arrival.  Patient denies fever, chills, headache, neck pain, chest pain, shortness of breath, numbness, weakness.  Patient does report currently when he stands he feels dizzy and has had 2 episodes of vomiting since the onset of his pain.  He denies a history of hypertension or smoking.   Son is at bedside and assists with history.  The history is provided by medical records, the patient and a relative. No language interpreter was used.       Past Medical History:  Diagnosis Date  . Arthritis   . Family history of adverse reaction to anesthesia    mother had problem with it due to her asthma    Patient Active Problem List   Diagnosis Date Noted  . Body mass index (BMI) of 39.0-39.9 in adult 11/10/2018  . Arthralgia  11/10/2018  . Sleep disorder 11/10/2018    Past Surgical History:  Procedure Laterality Date  . LAPAROSCOPIC APPENDECTOMY N/A 01/17/2019   Procedure: APPENDECTOMY LAPAROSCOPIC;  Surgeon: Ralene Ok, MD;  Location: [facility];  Service: General;  Laterality: N/A;  . SHOULDER SURGERY Left 09/19/2015       Family History  Problem Relation Age of Onset  . Cancer Sister   . Cancer Brother   . High Cholesterol Brother   . Colon cancer Neg Hx   . Pancreatic cancer Neg Hx   . Liver disease Neg Hx   . Esophageal cancer Neg Hx   . Stomach cancer Neg Hx     Social History   Tobacco Use  . Smoking status: Never Smoker  . Smokeless tobacco: Never Used  Vaping Use  . Vaping Use: Never used  Substance Use Topics  . Alcohol use: Not Currently    Comment: rare  . Drug use: Never    Home Medications Prior to Admission medications   Medication Sig Start Date End Date Taking? Authorizing Provider  hyoscyamine (LEVSIN SL) 0.125 MG SL tablet Place 1 tablet (0.125 mg total) under the tongue every 6 (six) hours as needed. Take one tablet every  4-6 hours as needed for abdominal cramping 11/15/20   Levin Erp, PA  ibuprofen (ADVIL) 200 MG tablet Take 400 mg by mouth every 6 (six) hours as needed (for arthritis pain).  [provider]  PEG-KCl-NaCl-NaSulf-Na Asc-C (PLENVU) 140 g SOLR Use as directed 11/15/20   Levin Erp, PA  rosuvastatin (CRESTOR) 20 MG tablet Take 1 tablet (20 mg total) by mouth daily. 09/12/20   [name], MD    Allergies    Patient has no known allergies.  Review of Systems   Review of Systems  Constitutional: Negative for appetite change, diaphoresis, fatigue, fever and unexpected weight change.  HENT: Negative for mouth sores.   Eyes: Negative for visual disturbance.  Respiratory: Negative for cough, chest tightness, shortness of breath and wheezing.   Cardiovascular: Negative for chest pain.  Gastrointestinal: Positive  for abdominal pain, nausea and vomiting. Negative for constipation and diarrhea.  Endocrine: Negative for polydipsia, polyphagia and polyuria.  Genitourinary: Negative for dysuria, frequency, hematuria and urgency.  Musculoskeletal: Negative for back pain and neck stiffness.  Skin: Negative for rash.  Allergic/Immunologic: Negative for immunocompromised state.  Neurological: Positive for light-headedness. Negative for syncope and headaches.  Hematological: Does not bruise/bleed easily.  Psychiatric/Behavioral: Negative for sleep disturbance. The patient is not nervous/anxious.     Physical Exam Updated Vital Signs BP (!) 133/94 (BP Location: Right Arm)   Pulse (!) 102   Temp 98.1 F (36.7 C) (Oral)   Resp 16   Ht 5\' 11"  (1.803 m)   Wt 111.1 kg   SpO2 96%   BMI 34.17 kg/m   Physical Exam Vitals and nursing note reviewed.  Constitutional:      General: He is in acute distress.     Appearance: He is ill-appearing. He is not diaphoretic.  HENT:     Head: Normocephalic.     Mouth/Throat:     Mouth: Mucous membranes are moist.  Eyes:     General: No scleral icterus.    Conjunctiva/sclera: Conjunctivae normal.  Cardiovascular:     Rate and Rhythm: Regular rhythm. Tachycardia present.     Pulses: Normal pulses.          Radial pulses are 2+ on the right side and 2+ on the left side.  Pulmonary:     Effort: Pulmonary effort is normal. No tachypnea, accessory muscle usage, prolonged expiration, respiratory distress or retractions.     Breath sounds: Normal breath sounds. No stridor.     Comments: Equal chest rise. No increased work of breathing. Abdominal:     General: Bowel sounds are normal. There is distension.     Palpations: Abdomen is soft.     Tenderness: There is abdominal tenderness in the periumbilical area. There is guarding. There is no right CVA tenderness, left CVA tenderness or rebound.  Musculoskeletal:     Cervical back: Normal range of motion.     Comments:  Moves all extremities equally and without difficulty.  Skin:    General: Skin is warm and dry.     Capillary Refill: Capillary refill takes less than 2 seconds.     Coloration: Skin is pale.  Neurological:     General: No focal deficit present.     Mental Status: He is alert.     GCS: GCS eye subscore is 4. GCS verbal subscore is 5. GCS motor subscore is 6.     Comments: Speech is clear and goal oriented.  Psychiatric:        Mood and Affect: Mood normal.     ED Results / Procedures / Treatments   Labs (all labs ordered are listed, but only abnormal results are displayed) Labs Reviewed  COMPREHENSIVE  METABOLIC PANEL - Abnormal; Notable for the following components:      Result Value   Glucose, Bld 112 (*)    Creatinine, Ser 1.25 (*)    All other components within normal limits  CBC - Abnormal; Notable for the following components:   WBC 12.8 (*)    All other components within normal limits  SARS CORONAVIRUS 2 (TAT 6-24 HRS)  LIPASE, BLOOD  URINALYSIS, ROUTINE W REFLEX MICROSCOPIC     Radiology CT Angio Chest/Abd/Pel for Dissection W and/or Wo Contrast  Result Date: 11/27/2020 CLINICAL DATA:  Abdominal aortic dissection suspected. Presenting with abdominal pain. EXAM: CT ANGIOGRAPHY CHEST, ABDOMEN AND PELVIS TECHNIQUE: Non-contrast CT of the chest was initially obtained. Multidetector CT imaging through the chest, abdomen and pelvis was performed using the standard protocol during bolus administration of intravenous contrast. Multiplanar reconstructed images and MIPs were obtained and reviewed to evaluate the vascular anatomy. CONTRAST:  125mL OMNIPAQUE IOHEXOL 350 MG/ML SOLN COMPARISON:  CT abdomen 01/17/2019. FINDINGS: CTA CHEST FINDINGS Cardiovascular: Preferential opacification of the thoracic aorta. No evidence of thoracic aortic aneurysm or dissection. Mild atherosclerotic plaque of the thoracic aorta. Normal heart size. No significant pericardial effusion. At least mild  four-vessel coronary artery calcifications. The main pulmonary artery is normal in caliber. Mediastinum/Nodes: No enlarged mediastinal, hilar, or axillary lymph nodes. Thyroid gland, trachea, and esophagus demonstrate no significant findings. Lungs/Pleura: Bilateral lower lobe subsegmental atelectasis. Linear atelectasis within the lingula. Subpleural right micronodule that appears to be triangular in morphology likely represents an intrapulmonary lymph node (7:75). No pulmonary mass. No focal consolidation. No pleural effusion. No pneumothorax. Musculoskeletal: No chest wall abnormality. No suspicious lytic or blastic osseous lesions. No acute displaced fracture. CTA ABDOMEN AND PELVIS FINDINGS VASCULAR Aorta: Mild atherosclerotic plaque. Normal caliber aorta without aneurysm, dissection, vasculitis or significant stenosis. Celiac: Patent without evidence of aneurysm, dissection, vasculitis or significant stenosis. SMA: Patent without evidence of aneurysm, dissection, vasculitis or significant stenosis. Renals: Both renal arteries are patent without evidence of aneurysm, dissection, vasculitis, fibromuscular dysplasia or significant stenosis. IMA: Patent without evidence of aneurysm, dissection, vasculitis or significant stenosis. Inflow: Mild atherosclerotic plaque. Patent without evidence of aneurysm, dissection, vasculitis or significant stenosis. Veins: No obvious venous abnormality within the limitations of this arterial phase study. NON-VASCULAR Hepatobiliary: An 8 mm hypodensity within the right hepatic lobe is similar to prior likely represents a hepatic hemangioma. No new focal liver abnormality. No gallstones, gallbladder wall thickening, or pericholecystic fluid. No biliary dilatation. Pancreas: No focal lesion. Normal pancreatic contour. No surrounding inflammatory changes. No main pancreatic ductal dilatation. Spleen: Normal in size without focal abnormality. Adrenals/Urinary Tract: No adrenal nodule  bilaterally. Bilateral kidneys enhance symmetrically. No hydronephrosis. No hydroureter. The urinary bladder is unremarkable. Stomach/Bowel: Stomach is within normal limits. Thickening of the terminal ileum. Multiple loops of mid to distal small bowel within the anterior mid and lower abdomen are distended with fluid with some air-fluid levels noted. Surrounding mesenteric edema is noted. No evidence of large bowel wall thickening or dilatation. Scattered colonic diverticulosis. No pneumatosis. Status post appendectomy. Lymphatic: Right lower quadrant lymphadenopathy with as an example a 1.3 cm lymph node (6:197). Reproductive: Prostate is unremarkable. Other: Trace simple free fluid within the lower abdomen and pelvis. No intraperitoneal free gas. No organized fluid collection. Musculoskeletal: No abdominal wall hernia or abnormality. No suspicious lytic or blastic osseous lesions. No acute displaced fracture. Review of the MIP images confirms the above findings. IMPRESSION: 1. Thickening of the terminal ileum with associated proximal  mid to distal small bowel obstruction. Findings could be due to an ileitis versus malignancy. Nonspecific mesenteric edema could be due to engorgement versus metastases. No associated bowel perforation. Recommend endoscopy for further evaluation. 2. Indeterminate right lower quadrant lymphadenopathy. 3. Scattered colonic diverticulosis with no acute diverticulitis. 4. Stable right hepatic lobe subcentimeter hyperdensity likely represents a hepatic hemangioma. 5. No acute vascular abnormality. Aortic Atherosclerosis (ICD10-I70.0) - mild. 6. No acute intrathoracic abnormality. Electronically Signed   By: Iven Finn M.D.   On: 11/27/2020 03:07    Procedures Procedures   Medications Ordered in ED Medications  oxyCODONE-acetaminophen (PERCOCET/ROXICET) 5-325 MG per tablet 1 tablet (1 tablet Oral Given 11/27/20 0025)  cefTRIAXone (ROCEPHIN) 2 g in sodium chloride 0.9 % 100 mL  IVPB (has no administration in time range)    And  metroNIDAZOLE (FLAGYL) IVPB 500 mg (500 mg Intravenous New Bag/Given 11/27/20 0338)  ondansetron (ZOFRAN-ODT) disintegrating tablet 4 mg (4 mg Oral Given 11/27/20 0024)  HYDROmorphone (DILAUDID) injection 1 mg (1 mg Intravenous Given 11/27/20 0219)  ondansetron (ZOFRAN) injection 4 mg (4 mg Intravenous Given 11/27/20 0219)  sodium chloride 0.9 % bolus 500 mL (0 mLs Intravenous Stopped 11/27/20 0337)  iohexol (OMNIPAQUE) 350 MG/ML injection 100 mL (100 mLs Intravenous Contrast Given 11/27/20 0241)    ED Course  I have reviewed the triage vital signs and the nursing notes.  Pertinent labs & imaging results that were available during my care of the patient were reviewed by me and considered in my medical decision making (see chart for details).  Clinical Course as of 11/27/20 0341  Wed Nov 27, 2020  0145 Creatinine(!): 1.25 slightly elevated [HM]  0145 WBC(!): 12.8 Leukocytosis noted [HM]  0145 Pulse Rate(!): 102 Tachycardic on arrival - likely secondary to pain [HM]  0145 Temp: 98.1 F (36.7 C) Afebrile [HM]    Clinical Course User Index [HM] Muthersbaugh, Gwenlyn Perking   MDM Rules/Calculators/A&P                           Patient presents with severe abdominal pain described as ripping and tearing that radiates to the back.  Concern for possible dissection versus bowel obstruction.  Labs overall reassuring.  Slightly elevated serum creatinine, fluids ordered.  Mild leukocytosis.  CT angio dissection study ordered.  Pain control given.  3:31 AM CT scan shows small bowel obstruction secondary to ileitis.  I personally evaluated these images.  Concern for possible new diagnosis of colon cancer.  Given 8 weeks of pain with mesenteric edema and lymphadenopathy concern for likely malignancy.  Will treat for ileitis with antibiotics given mild leukocytosis and admit for bowel obstruction.  Will insert NG tube and consult surgery.   Discussion  with patient and family regarding findings.  Questions answered.  PCP Agustina Caroli [facility].  3:41 AM Discussed patient's case with general surgery, Dr. Thermon Leyland. I have recommended admission and patient (and family if present) agree with this plan. Admitting physician will place admission orders.   BP 130/88   Pulse 75   Temp 98.1 F (36.7 C) (Oral)   Resp 16   Ht 5\' 11"  (1.803 m)   Wt 111.1 kg   SpO2 94%   BMI 34.17 kg/m    Final Clinical Impression(s) / ED Diagnoses Final diagnoses:  Ileitis  Intractable abdominal pain  Intractable vomiting with nausea, unspecified vomiting type  Small bowel obstruction ([place])    Rx / DC Orders ED  Discharge Orders    None       Loni Muse Gwenlyn Perking [birth year] [id number]    Delora Fuel, MD 61/68/37 [phone]

## 2020-11-27 NOTE — ED Notes (Signed)
Pt Sa02 was 84-88% on RA. I placed pt on 4L 02 per [place] brought Sa02 up to 94%

## 2020-11-27 NOTE — ED Notes (Signed)
Patient transported to CT 

## 2020-11-27 NOTE — Progress Notes (Signed)
PROGRESS NOTE  Brief Narrative: Ricardo [name] is a 55 y.o. male with a history of HLD, laparoscopic appendectomy May 2020, and several weeks of eructation, intermittent central abdominal tenderness and cramping who presented to the ED 3/16 with worsening, severe pain reported to radiate to the back associated with nausea and vomiting and decreased stool output. WBC 12.8k, CTA chest/abd/pelvis showed no dissection but revealed terminal ileum thickening with suggested small bowel obstruction. CT abd/pelvis with PO and IV contrast is delayed by recent dye load until 3/17 AM per surgery recommendations. NGT inserted with improvement in nausea but not abdominal pain, and the patient was admitted this morning by Dr. Tonie Griffith with surgery consulting.  Subjective: Central abdominal pain and bloating recurs after dilaudid wears off. Nausea improved, black output from NGT. No BM.  Objective: BP (!) 146/95 (BP Location: Right Arm)   Pulse 75   Temp 97.8 F (36.6 C) (Oral)   Resp 18   Ht 5\' 11"  (1.803 m)   Wt 111.1 kg   SpO2 98%   BMI 34.17 kg/m   Gen: Tired-appearing, chronically WDWN male  Pulm: Clear and nonlabored  CV: RRR, no murmur, no JVD, no edema GI: Distended with central tenderness without rebound or guarding. Hypoactive tinkling bowel sounds.  Neuro: Alert and oriented. No focal deficits. Skin: No rashes, lesions or ulcers  Assessment & Plan: Principal Problem:   SBO (small bowel obstruction) (HCC) Active Problems:   Ileitis   Intractable abdominal pain   Nausea and vomiting   Hyperlipidemia   Lymphadenopathy, abdominal  Thickened ileum, SBO: Treating as possible infectious ileitis. RLQ lymphadenopathy argues for infectious vs. malignancy. Adhesion from appendectomy last year also possible.  - Zosyn ordered empirically - CT abd/pelvis ~8am 3/17.  - Continue NGT - IV analgesic, antiemetic, IVF.   Abdominal cramping, eructation, pain:  - Was planning diagnostic EGD 4/18  with Dr. Havery Moros as well as screening colonoscopy. Pending CT results, may need [facility] input.   Patrecia Pour, MD Pager on amion 11/27/2020, 4:39 PM

## 2020-11-28 ENCOUNTER — Other Ambulatory Visit: Payer: Self-pay

## 2020-11-28 ENCOUNTER — Inpatient Hospital Stay (HOSPITAL_COMMUNITY): Payer: BC Managed Care – PPO

## 2020-11-28 DIAGNOSIS — R109 Unspecified abdominal pain: Secondary | ICD-10-CM

## 2020-11-28 DIAGNOSIS — K529 Noninfective gastroenteritis and colitis, unspecified: Secondary | ICD-10-CM

## 2020-11-28 DIAGNOSIS — R590 Localized enlarged lymph nodes: Secondary | ICD-10-CM

## 2020-11-28 LAB — BASIC METABOLIC PANEL
Anion gap: 9 (ref 5–15)
BUN: 10 mg/dL (ref 6–20)
CO2: 26 mmol/L (ref 22–32)
Calcium: 8.6 mg/dL — ABNORMAL LOW (ref 8.9–10.3)
Chloride: 103 mmol/L (ref 98–111)
Creatinine, Ser: 1.26 mg/dL — ABNORMAL HIGH (ref 0.61–1.24)
GFR, Estimated: >60 mL/min (ref >60)
Glucose, Bld: 99 mg/dL (ref 70–99)
Potassium: 4 mmol/L (ref 3.5–5.1)
Sodium: 138 mmol/L (ref 135–145)

## 2020-11-28 LAB — CBC
HCT: 41.7 % (ref 39.0–52.0)
Hemoglobin: 14 g/dL (ref 13.0–17.0)
MCH: 29.9 pg (ref 26.0–34.0)
MCHC: 33.6 g/dL (ref 30.0–36.0)
MCV: 89.1 fL (ref 80.0–100.0)
Platelets: 273 10*3/uL (ref 150–400)
RBC: 4.68 MIL/uL (ref 4.22–5.81)
RDW: 13.2 % (ref 11.5–15.5)
WBC: 7.3 10*3/uL (ref 4.0–10.5)
nRBC: 0 % (ref 0.0–0.2)

## 2020-11-28 MED ORDER — CHLORHEXIDINE GLUCONATE CLOTH 2 % EX PADS
6.0000 | MEDICATED_PAD | Freq: Once | CUTANEOUS | Status: AC
  Administered 2020-11-29: 6 via TOPICAL

## 2020-11-28 MED ORDER — SODIUM CHLORIDE 0.9 % IV SOLN
2.0000 g | INTRAVENOUS | Status: DC
  Filled 2020-11-28: qty 2

## 2020-11-28 MED ORDER — CHLORHEXIDINE GLUCONATE CLOTH 2 % EX PADS: 6.0000 | MEDICATED_PAD | Freq: Once | CUTANEOUS | Status: DC

## 2020-11-28 MED ORDER — IOHEXOL 9 MG/ML PO SOLN
ORAL | Status: AC
  Filled 2020-11-28: qty 1000

## 2020-11-28 MED ORDER — IOHEXOL 300 MG/ML  SOLN
100.0000 mL | Freq: Once | INTRAMUSCULAR | Status: AC | PRN
  Administered 2020-11-28: 100 mL via INTRAVENOUS

## 2020-11-28 NOTE — Progress Notes (Addendum)
[facility] Progress Note     Subjective: CC-  Overall feeling better. Abdominal pain and bloating are less. He is passing flatus and has had multiple loose BMs, one being large volume. NG tube is clamped for contrast and he denies any n/v at this time. WBC 7.3, VSS  Objective: Vital signs in last 24 hours: Temp:  [97.8 F (36.6 C)-98.5 F (36.9 C)] 98.2 F (36.8 C) (03/17 0442) Pulse Rate:  [68-75] 69 (03/17 0442) Resp:  [11-18] 18 (03/17 0442) BP: (133-146)/(72-95) 133/82 (03/17 0442) SpO2:  [91 %-98 %] 97 % (03/17 0442) Last BM Date: 11/27/20  Intake/Output from previous day: 03/16 0701 - 03/17 0700 In: 1128.2 [I.V.:1108.4; IV Piggyback:19.8] Out: 700 [Urine:500; Emesis/NG output:200] Intake/Output this shift: No intake/output data recorded.  PE: Gen:  Alert, NAD, pleasant HEENT: EOM's intact, pupils equal and round Card:  RRR Pulm:  CTAB, no W/R/R, rate and effort normal Abd: distended but soft, minimal diffuse TTP without rebound or guarding, +BS, no HSM Psych: A&Ox4  Skin: no rashes noted, warm and dry  Lab Results:  Recent Labs    11/27/20 0612 11/28/20 0158  WBC 11.0* 7.3  HGB 14.7 14.0  HCT 43.6 41.7  PLT 276 273   BMET Recent Labs    11/27/20 0022 11/27/20 0612 11/28/20 0158  NA 136  --  138  K 4.3  --  4.0  CL 103  --  103  CO2 25  --  26  GLUCOSE 112*  --  99  BUN 11  --  10  CREATININE 1.25* 1.16 1.26*  CALCIUM 9.5  --  8.6*   PT/INR No results for input(s): LABPROT, INR in the last 72 hours. CMP     Component Value Date/Time   NA 138 11/28/2020 0158   NA 138 09/04/2020 0911   K 4.0 11/28/2020 0158   CL 103 11/28/2020 0158   CO2 26 11/28/2020 0158   GLUCOSE 99 11/28/2020 0158   BUN 10 11/28/2020 0158   BUN 15 09/04/2020 0911   CREATININE 1.26 (H) 11/28/2020 0158   CALCIUM 8.6 (L) 11/28/2020 0158   PROT 8.0 11/27/2020 0022   PROT 7.3 09/04/2020 0911   ALBUMIN 4.3 11/27/2020 0022   ALBUMIN 4.6 09/04/2020 0911    AST 22 11/27/2020 0022   ALT 21 11/27/2020 0022   ALKPHOS 85 11/27/2020 0022   BILITOT 0.9 11/27/2020 0022   BILITOT 0.6 09/04/2020 0911   GFRNONAA >60 11/28/2020 0158   GFRAA 88 09/04/2020 0911   Lipase     Component Value Date/Time   LIPASE 34 11/27/2020 0022       Studies/Results: DG Abdomen 1 View  Result Date: 11/27/2020 CLINICAL DATA:  Nasogastric tube placement EXAM: ABDOMEN - 1 VIEW COMPARISON:  CT from earlier today FINDINGS: Enteric tube with tip and side-port over the stomach. Known small bowel obstruction. Mild atelectasis at the right base. IMPRESSION: Enteric tube with tip in the mid stomach. Electronically Signed   By: Monte Fantasia M.D.   On: 11/27/2020 07:44   CT Angio Chest/Abd/Pel for Dissection W and/or Wo Contrast  Result Date: 11/27/2020 CLINICAL DATA:  Abdominal aortic dissection suspected. Presenting with abdominal pain. EXAM: CT ANGIOGRAPHY CHEST, ABDOMEN AND PELVIS TECHNIQUE: Non-contrast CT of the chest was initially obtained. Multidetector CT imaging through the chest, abdomen and pelvis was performed using the standard protocol during bolus administration of intravenous contrast. Multiplanar reconstructed images and MIPs were obtained and reviewed to evaluate the vascular anatomy. CONTRAST:  137mL OMNIPAQUE IOHEXOL 350 MG/ML SOLN COMPARISON:  CT abdomen 01/17/2019. FINDINGS: CTA CHEST FINDINGS Cardiovascular: Preferential opacification of the thoracic aorta. No evidence of thoracic aortic aneurysm or dissection. Mild atherosclerotic plaque of the thoracic aorta. Normal heart size. No significant pericardial effusion. At least mild four-vessel coronary artery calcifications. The main pulmonary artery is normal in caliber. Mediastinum/Nodes: No enlarged mediastinal, hilar, or axillary lymph nodes. Thyroid gland, trachea, and esophagus demonstrate no significant findings. Lungs/Pleura: Bilateral lower lobe subsegmental atelectasis. Linear atelectasis within the  lingula. Subpleural right micronodule that appears to be triangular in morphology likely represents an intrapulmonary lymph node (7:75). No pulmonary mass. No focal consolidation. No pleural effusion. No pneumothorax. Musculoskeletal: No chest wall abnormality. No suspicious lytic or blastic osseous lesions. No acute displaced fracture. CTA ABDOMEN AND PELVIS FINDINGS VASCULAR Aorta: Mild atherosclerotic plaque. Normal caliber aorta without aneurysm, dissection, vasculitis or significant stenosis. Celiac: Patent without evidence of aneurysm, dissection, vasculitis or significant stenosis. SMA: Patent without evidence of aneurysm, dissection, vasculitis or significant stenosis. Renals: Both renal arteries are patent without evidence of aneurysm, dissection, vasculitis, fibromuscular dysplasia or significant stenosis. IMA: Patent without evidence of aneurysm, dissection, vasculitis or significant stenosis. Inflow: Mild atherosclerotic plaque. Patent without evidence of aneurysm, dissection, vasculitis or significant stenosis. Veins: No obvious venous abnormality within the limitations of this arterial phase study. NON-VASCULAR Hepatobiliary: An 8 mm hypodensity within the right hepatic lobe is similar to prior likely represents a hepatic hemangioma. No new focal liver abnormality. No gallstones, gallbladder wall thickening, or pericholecystic fluid. No biliary dilatation. Pancreas: No focal lesion. Normal pancreatic contour. No surrounding inflammatory changes. No main pancreatic ductal dilatation. Spleen: Normal in size without focal abnormality. Adrenals/Urinary Tract: No adrenal nodule bilaterally. Bilateral kidneys enhance symmetrically. No hydronephrosis. No hydroureter. The urinary bladder is unremarkable. Stomach/Bowel: Stomach is within normal limits. Thickening of the terminal ileum. Multiple loops of mid to distal small bowel within the anterior mid and lower abdomen are distended with fluid with some  air-fluid levels noted. Surrounding mesenteric edema is noted. No evidence of large bowel wall thickening or dilatation. Scattered colonic diverticulosis. No pneumatosis. Status post appendectomy. Lymphatic: Right lower quadrant lymphadenopathy with as an example a 1.3 cm lymph node (6:197). Reproductive: Prostate is unremarkable. Other: Trace simple free fluid within the lower abdomen and pelvis. No intraperitoneal free gas. No organized fluid collection. Musculoskeletal: No abdominal wall hernia or abnormality. No suspicious lytic or blastic osseous lesions. No acute displaced fracture. Review of the MIP images confirms the above findings. IMPRESSION: 1. Thickening of the terminal ileum with associated proximal mid to distal small bowel obstruction. Findings could be due to an ileitis versus malignancy. Nonspecific mesenteric edema could be due to engorgement versus metastases. No associated bowel perforation. Recommend endoscopy for further evaluation. 2. Indeterminate right lower quadrant lymphadenopathy. 3. Scattered colonic diverticulosis with no acute diverticulitis. 4. Stable right hepatic lobe subcentimeter hyperdensity likely represents a hepatic hemangioma. 5. No acute vascular abnormality. Aortic Atherosclerosis (ICD10-I70.0) - mild. 6. No acute intrathoracic abnormality. Electronically Signed   By: Iven Finn M.D.   On: 11/27/2020 03:07    Anti-infectives: Anti-infectives (From admission, onward)   Start     Dose/Rate Route Frequency Ordered Stop   11/27/20 1430  piperacillin-tazobactam (ZOSYN) IVPB 3.375 g        3.375 g 12.5 mL/hr over 240 Minutes Intravenous Every 8 hours 11/27/20 1414     11/27/20 0330  cefTRIAXone (ROCEPHIN) 2 g in sodium chloride 0.9 % 100 mL IVPB       "  And" Linked Group Details   2 g 200 mL/hr over 30 Minutes Intravenous  Once 11/27/20 0329 11/27/20 0433   11/27/20 0330  metroNIDAZOLE (FLAGYL) IVPB 500 mg       "And" Linked Group Details   500 mg 100 mL/hr  over 60 Minutes Intravenous  Once 11/27/20 0329 11/27/20 0434       Assessment/Plan HLD Obesity BMI 34.17   SBO vs ileitis vs malignancy - PSH: appendectomy - CTA yesterday with no mesenteric vessel disease to explain his complaints; shows some dilation of mid to distal small bowel concerning for ileitis or small bowel obstruction, also mentions possibility of malignancy; no bowel perforation  ID - zosyn 3/16>> FEN - IVF, NPO, NGT clamped VTE - SCDs, lovenox Foley - none Follow up - TBD  Plan - Overall patient has improved and bowel function is returning. He is finishing up PO contrast for repeat CT scan now, will follow up on imaging. Keep NG tube clamped unless patient because nauseated, vomits, or has worsening abdominal pain/ bloating - then return to [facility].    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   Case discussed with Dr. [name]. Plan for surgery tomorrow (exploratory laparotomy, partial bowel section, possible ostomy, peritoneal biopsy). NPO after midnight. Check tumor markers.    LOS: 1 day    Wellington [place], [facility] 11/28/2020, 8:46 AM Please see Amion for pager number during day hours 7:00am-4:30pm

## 2020-11-28 NOTE — Progress Notes (Signed)
PROGRESS NOTE  Ricardo [name]  VQX:[id number] DOB: December 07, 1965 DOA: 11/27/2020 PCP: [name], MD  Outpatient Specialists: [facility] Brief Narrative: Ricardo [name] is a 56 y.o. male with a history of HLD, laparoscopic appendectomy May 2020, and several weeks of eructation, intermittent central abdominal tenderness and cramping who presented to the ED 3/16 with worsening, severe pain reported to radiate to the back associated with nausea and vomiting and decreased stool output. WBC 12.8k, CTA chest/abd/pelvis showed no dissection but revealed terminal ileum thickening with suggested small bowel obstruction. CT abd/pelvis with PO and IV contrast is delayed by recent dye load until 3/17 AM per surgery recommendations. NGT was inserted and symptoms have improved. Repeat CT with contrast reveals circumferential thickening of the ileum with regional lymphadenopathy and peritoneal nodularity and caking suggestive of new diagnosis of malignancy. Surgery is planning to take to the OR 3/18.   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  - D/w [facility] who will hold off on evaluation at this time as surgery is planned 3/18. - Diet per surgery. NPO p MN. - Continue supportive care for symptoms.  - Check tumor markers. Will plan to consult oncology once tissue diagnosis is defined.   Partial SBO: Due to mass above.  - Symptoms have improved and radiographic appearance is less severe with decompression.   Obesity: Estimated body mass index is 34.17 kg/m as calculated from the following:   Height as of this encounter: 5\' 11"  (1.803 m).   Weight as of this encounter: 111.1 kg.  DVT  prophylaxis: Lovenox Code Status: Full Family Communication: Wife at bedside this AM Disposition Plan:  Status is: Inpatient  Remains inpatient appropriate because:Ongoing diagnostic testing needed not appropriate for outpatient work up   Dispo: The patient is from: Home              Anticipated d/c is to: Home              Patient currently is not medically stable to d/c.  Consultants:   General surgery  Procedures:   NG tube insertion  Antimicrobials:  Zosyn   Subjective: Abdominal bloating and cramping still there but improved with several BMs overnight. +flatus. Drank contrast without issues.   Objective: Vitals:   11/27/20 2122 11/28/20 0030 11/28/20 0442 11/28/20 1121  BP: 135/82 134/72 133/82 125/84  Pulse: 70 69 69 72  Resp: 18 18 18 19   Temp: 98 F (36.7 C) 98.3 F (36.8 C) 98.2 F (36.8 C) 98.7 F (37.1 C)  TempSrc: Oral Oral Oral Oral  SpO2: 98% 96% 97% 95%  Weight:      Height:        Intake/Output Summary (Last 24 hours) at 11/28/2020 1328 Last data filed at 11/28/2020 0700 Gross per 24 hour  Intake 1128.24 ml  Output 700 ml  Net 428.24 ml   Filed Weights   11/27/20 0018  Weight: 111.1 kg    Gen: 54 y.o. male in no distress  Pulm: Non-labored breathing. Clear to auscultation bilaterally.  CV: Regular rate and rhythm. No murmur, rub, or gallop. No JVD, no pedal edema. GI: Abdomen soft, protuberant with diminished bowel sounds. Minimal central tenderness to palpation. Ext: Warm, no deformities  Skin: No rashes, lesions or ulcers Neuro: Alert and oriented. No focal neurological deficits. Psych: Judgement and insight appear normal. Mood & affect appropriate.   Data Reviewed: I have personally reviewed following labs and imaging studies  CBC: Recent Labs  Lab 11/27/20 0022 11/27/20 0612 11/28/20 0158  WBC 12.8* 11.0* 7.3  HGB 15.7 14.7 14.0  HCT 44.9 43.6 41.7  MCV 86.8 88.4 89.1  PLT 330 276 841   Basic Metabolic Panel: Recent  Labs  Lab 11/27/20 0022 11/27/20 0612 11/28/20 0158  NA 136  --  138  K 4.3  --  4.0  CL 103  --  103  CO2 25  --  26  GLUCOSE 112*  --  99  BUN 11  --  10  CREATININE 1.25* 1.16 1.26*  CALCIUM 9.5  --  8.6*   GFR: Estimated Creatinine Clearance: 84 mL/min (A) (by C-G formula based on SCr of 1.26 mg/dL (H)). Liver Function Tests: Recent Labs  Lab 11/27/20 0022  AST 22  ALT 21  ALKPHOS 85  BILITOT 0.9  PROT 8.0  ALBUMIN 4.3   Recent Labs  Lab 11/27/20 0022  LIPASE 34   No results for input(s): AMMONIA in the last 168 hours. Coagulation Profile: No results for input(s): INR, PROTIME in the last 168 hours. Cardiac Enzymes: No results for input(s): CKTOTAL, CKMB, CKMBINDEX, TROPONINI in the last 168 hours. BNP (last 3 results) No results for input(s): PROBNP in the last 8760 hours. HbA1C: No results for input(s): HGBA1C in the last 72 hours. CBG: No results for input(s): GLUCAP in the last 168 hours. Lipid Profile: No results for input(s): CHOL, HDL, LDLCALC, TRIG, CHOLHDL, LDLDIRECT in the last 72 hours. Thyroid Function Tests: No results for input(s): TSH, T4TOTAL, FREET4, T3FREE, THYROIDAB in the last 72 hours. Anemia Panel: No results for input(s): VITAMINB12, FOLATE, FERRITIN, TIBC, IRON, RETICCTPCT in the last 72 hours. Urine analysis:    Component Value Date/Time   COLORURINE YELLOW 11/27/2020 0612   APPEARANCEUR CLEAR 11/27/2020 0612   LABSPEC >1.046 (H) 11/27/2020 0612   PHURINE 5.0 11/27/2020 0612   GLUCOSEU NEGATIVE 11/27/2020 0612   HGBUR NEGATIVE 11/27/2020 0612   BILIRUBINUR NEGATIVE 11/27/2020 0612   KETONESUR 5 (A) 11/27/2020 0612   PROTEINUR NEGATIVE 11/27/2020 0612   NITRITE NEGATIVE 11/27/2020 0612   LEUKOCYTESUR NEGATIVE 11/27/2020 0612   Recent Results (from the past 240 hour(s))  SARS CORONAVIRUS 2 (TAT 6-24 HRS) Nasopharyngeal Nasopharyngeal Swab     Status: None   Collection Time: 11/27/20  3:32 AM   Specimen: Nasopharyngeal Swab   Result Value Ref Range Status   SARS Coronavirus 2 NEGATIVE NEGATIVE Final    Comment: (NOTE) SARS-CoV-2 target nucleic acids are NOT DETECTED.  The SARS-CoV-2 RNA is generally detectable in upper and lower respiratory specimens during the acute phase of infection. Negative results do not preclude SARS-CoV-2 infection, do not rule out co-infections with other pathogens, and should not be used as the sole basis for treatment or other patient management decisions. Negative results must be combined with clinical observations, patient history, and epidemiological information. The expected result is Negative.  Fact Sheet for Patients: SugarRoll.be  Fact Sheet for Healthcare Providers: https://www.woods-mathews.com/  This test is not yet approved or cleared by the [place] FDA and  has been authorized for detection and/or diagnosis of SARS-CoV-2 by FDA under an Emergency Use Authorization (EUA). This EUA will remain  in effect (meaning this test can be used) for the duration of  the COVID-19 declaration under Se ction 564(b)(1) of the Act, 21 U.S.C. section 360bbb-3(b)(1), unless the authorization is terminated or revoked sooner.  Performed at [facility], [place] [address], [place],  [address]       Radiology Studies: DG Abdomen 1 View  Result Date: 11/27/2020 CLINICAL DATA:  Nasogastric tube placement EXAM: ABDOMEN - 1 VIEW COMPARISON:  CT from earlier today FINDINGS: Enteric tube with tip and side-port over the stomach. Known small bowel obstruction. Mild atelectasis at the right base. IMPRESSION: Enteric tube with tip in the mid stomach. Electronically Signed   By: Monte Fantasia M.D.   On: 11/27/2020 07:44   CT ABDOMEN PELVIS W CONTRAST  Result Date: 11/28/2020 CLINICAL DATA:  Bowel obstruction suspected EXAM: CT ABDOMEN AND PELVIS WITH CONTRAST TECHNIQUE: Multidetector CT imaging of the abdomen and pelvis was performed  using the standard protocol following bolus administration of intravenous contrast. CONTRAST:  168mL OMNIPAQUE IOHEXOL 300 MG/ML SOLN, additional oral enteric contrast COMPARISON:  Abdominal radiographs, 11/27/2020, CT chest abdomen pelvis angiogram, 11/27/2020 CT abdomen pelvis, 01/17/2019 FINDINGS: Lower chest: Atelectasis or consolidation of the dependent bilateral lung bases, new compared to prior examination. Hepatobiliary: No solid liver abnormality is seen. No gallstones, gallbladder wall thickening, or biliary dilatation. Pancreas: Unremarkable. No pancreatic ductal dilatation or surrounding inflammatory changes. Spleen: Normal in size without significant abnormality. Adrenals/Urinary Tract: Adrenal glands are unremarkable. Kidneys are normal, without renal calculi, solid lesion, or hydronephrosis. Bladder is unremarkable. Stomach/Bowel: Esophagogastric tube with tip and side port below the diaphragm. Stomach is within normal limits. Appendix is surgically absent. The small bowel is generally decompressed, with full some fluid-filled, nondistended loops throughout. There is transit of oral enteric contrast to the terminal ileum. There is masslike, circumferential thickening of the terminal ileum and cecal base near the ileocecal valve (series 8, image 82, series 5, image 57). Sigmoid diverticulosis. Vascular/Lymphatic: Aortic atherosclerosis. There are abnormally enlarged lymph nodes in the right lower quadrant mesentery adjacent to the terminal ileum measuring up to 2.3 x 1.4 cm (series 5, image 52). Reproductive: No mass or other significant abnormality. Other: Small, fat containing left inguinal hernia. There is small volume perihepatic and perisplenic ascites. There is extensive omental and peritoneal nodularity and caking (series 5, image 38, 72). Musculoskeletal: No acute or significant osseous findings. IMPRESSION: 1. There is masslike, circumferential thickening of the terminal ileum and cecal base  near the ileocecal valve and abnormally enlarged lymph nodes in the right lower quadrant mesentery adjacent to the terminal ileum measuring up to 2.3 x 1.4 cm. 2. There is extensive omental and peritoneal nodularity and caking throughout the abdomen. 3. Findings are highly concerning for primary colon malignancy with nodal and peritoneal metastatic disease. 4. Small volume perihepatic and perisplenic ascites. 5. The small bowel is generally decompressed, with full some fluid-filled, nondistended loops throughout. There is transit of oral enteric contrast to the terminal ileum. No evidence of overt bowel obstruction at this time. Esophagogastric tube is position with tip and side port below the diaphragm. 6. Atelectasis or consolidation of the dependent bilateral lung bases, new compared to prior examination. Aortic Atherosclerosis (ICD10-I70.0). Electronically Signed   By: Eddie Candle M.D.   On: 11/28/2020 11:08   CT Angio Chest/Abd/Pel for Dissection W and/or Wo Contrast  Result Date: 11/27/2020 CLINICAL DATA:  Abdominal aortic dissection suspected. Presenting with abdominal pain. EXAM: CT ANGIOGRAPHY CHEST, ABDOMEN AND PELVIS TECHNIQUE: Non-contrast CT of the chest was initially obtained. Multidetector CT imaging through the chest,  abdomen and pelvis was performed using the standard protocol during bolus administration of intravenous contrast. Multiplanar reconstructed images and MIPs were obtained and reviewed to evaluate the vascular anatomy. CONTRAST:  164mL OMNIPAQUE IOHEXOL 350 MG/ML SOLN COMPARISON:  CT abdomen 01/17/2019. FINDINGS: CTA CHEST FINDINGS Cardiovascular: Preferential opacification of the thoracic aorta. No evidence of thoracic aortic aneurysm or dissection. Mild atherosclerotic plaque of the thoracic aorta. Normal heart size. No significant pericardial effusion. At least mild four-vessel coronary artery calcifications. The main pulmonary artery is normal in caliber. Mediastinum/Nodes: No  enlarged mediastinal, hilar, or axillary lymph nodes. Thyroid gland, trachea, and esophagus demonstrate no significant findings. Lungs/Pleura: Bilateral lower lobe subsegmental atelectasis. Linear atelectasis within the lingula. Subpleural right micronodule that appears to be triangular in morphology likely represents an intrapulmonary lymph node (7:75). No pulmonary mass. No focal consolidation. No pleural effusion. No pneumothorax. Musculoskeletal: No chest wall abnormality. No suspicious lytic or blastic osseous lesions. No acute displaced fracture. CTA ABDOMEN AND PELVIS FINDINGS VASCULAR Aorta: Mild atherosclerotic plaque. Normal caliber aorta without aneurysm, dissection, vasculitis or significant stenosis. Celiac: Patent without evidence of aneurysm, dissection, vasculitis or significant stenosis. SMA: Patent without evidence of aneurysm, dissection, vasculitis or significant stenosis. Renals: Both renal arteries are patent without evidence of aneurysm, dissection, vasculitis, fibromuscular dysplasia or significant stenosis. IMA: Patent without evidence of aneurysm, dissection, vasculitis or significant stenosis. Inflow: Mild atherosclerotic plaque. Patent without evidence of aneurysm, dissection, vasculitis or significant stenosis. Veins: No obvious venous abnormality within the limitations of this arterial phase study. NON-VASCULAR Hepatobiliary: An 8 mm hypodensity within the right hepatic lobe is similar to prior likely represents a hepatic hemangioma. No new focal liver abnormality. No gallstones, gallbladder wall thickening, or pericholecystic fluid. No biliary dilatation. Pancreas: No focal lesion. Normal pancreatic contour. No surrounding inflammatory changes. No main pancreatic ductal dilatation. Spleen: Normal in size without focal abnormality. Adrenals/Urinary Tract: No adrenal nodule bilaterally. Bilateral kidneys enhance symmetrically. No hydronephrosis. No hydroureter. The urinary bladder is  unremarkable. Stomach/Bowel: Stomach is within normal limits. Thickening of the terminal ileum. Multiple loops of mid to distal small bowel within the anterior mid and lower abdomen are distended with fluid with some air-fluid levels noted. Surrounding mesenteric edema is noted. No evidence of large bowel wall thickening or dilatation. Scattered colonic diverticulosis. No pneumatosis. Status post appendectomy. Lymphatic: Right lower quadrant lymphadenopathy with as an example a 1.3 cm lymph node (6:197). Reproductive: Prostate is unremarkable. Other: Trace simple free fluid within the lower abdomen and pelvis. No intraperitoneal free gas. No organized fluid collection. Musculoskeletal: No abdominal wall hernia or abnormality. No suspicious lytic or blastic osseous lesions. No acute displaced fracture. Review of the MIP images confirms the above findings. IMPRESSION: 1. Thickening of the terminal ileum with associated proximal mid to distal small bowel obstruction. Findings could be due to an ileitis versus malignancy. Nonspecific mesenteric edema could be due to engorgement versus metastases. No associated bowel perforation. Recommend endoscopy for further evaluation. 2. Indeterminate right lower quadrant lymphadenopathy. 3. Scattered colonic diverticulosis with no acute diverticulitis. 4. Stable right hepatic lobe subcentimeter hyperdensity likely represents a hepatic hemangioma. 5. No acute vascular abnormality. Aortic Atherosclerosis (ICD10-I70.0) - mild. 6. No acute intrathoracic abnormality. Electronically Signed   By: Iven Finn M.D.   On: 11/27/2020 03:07    Scheduled Meds: . enoxaparin (LOVENOX) injection  40 mg Subcutaneous Daily  . iohexol       Continuous Infusions: . lactated ringers 125 mL/hr at 11/28/20 1233  . piperacillin-tazobactam (ZOSYN)  IV  3.375 g (11/28/20 0603)     LOS: 1 day   Time spent: 35 minutes.  Patrecia Pour, MD [facility] [email or web address] 11/28/2020, 1:28 PM

## 2020-11-28 NOTE — H&P (View-Only) (Signed)
[facility] Progress Note     Subjective: CC-  Overall feeling better. Abdominal pain and bloating are less. He is passing flatus and has had multiple loose BMs, one being large volume. NG tube is clamped for contrast and he denies any n/v at this time. WBC 7.3, VSS  Objective: Vital signs in last 24 hours: Temp:  [97.8 F (36.6 C)-98.5 F (36.9 C)] 98.2 F (36.8 C) (03/17 0442) Pulse Rate:  [68-75] 69 (03/17 0442) Resp:  [11-18] 18 (03/17 0442) BP: (133-146)/(72-95) 133/82 (03/17 0442) SpO2:  [91 %-98 %] 97 % (03/17 0442) Last BM Date: 11/27/20  Intake/Output from previous day: 03/16 0701 - 03/17 0700 In: 1128.2 [I.V.:1108.4; IV Piggyback:19.8] Out: 700 [Urine:500; Emesis/NG output:200] Intake/Output this shift: No intake/output data recorded.  PE: Gen:  Alert, NAD, pleasant HEENT: EOM's intact, pupils equal and round Card:  RRR Pulm:  CTAB, no W/R/R, rate and effort normal Abd: distended but soft, minimal diffuse TTP without rebound or guarding, +BS, no HSM Psych: A&Ox4  Skin: no rashes noted, warm and dry  Lab Results:  Recent Labs    11/27/20 0612 11/28/20 0158  WBC 11.0* 7.3  HGB 14.7 14.0  HCT 43.6 41.7  PLT 276 273   BMET Recent Labs    11/27/20 0022 11/27/20 0612 11/28/20 0158  NA 136  --  138  K 4.3  --  4.0  CL 103  --  103  CO2 25  --  26  GLUCOSE 112*  --  99  BUN 11  --  10  CREATININE 1.25* 1.16 1.26*  CALCIUM 9.5  --  8.6*   PT/INR No results for input(s): LABPROT, INR in the last 72 hours. CMP     Component Value Date/Time   NA 138 11/28/2020 0158   NA 138 09/04/2020 0911   K 4.0 11/28/2020 0158   CL 103 11/28/2020 0158   CO2 26 11/28/2020 0158   GLUCOSE 99 11/28/2020 0158   BUN 10 11/28/2020 0158   BUN 15 09/04/2020 0911   CREATININE 1.26 (H) 11/28/2020 0158   CALCIUM 8.6 (L) 11/28/2020 0158   PROT 8.0 11/27/2020 0022   PROT 7.3 09/04/2020 0911   ALBUMIN 4.3 11/27/2020 0022   ALBUMIN 4.6 09/04/2020 0911    AST 22 11/27/2020 0022   ALT 21 11/27/2020 0022   ALKPHOS 85 11/27/2020 0022   BILITOT 0.9 11/27/2020 0022   BILITOT 0.6 09/04/2020 0911   GFRNONAA >60 11/28/2020 0158   GFRAA 88 09/04/2020 0911   Lipase     Component Value Date/Time   LIPASE 34 11/27/2020 0022       Studies/Results: DG Abdomen 1 View  Result Date: 11/27/2020 CLINICAL DATA:  Nasogastric tube placement EXAM: ABDOMEN - 1 VIEW COMPARISON:  CT from earlier today FINDINGS: Enteric tube with tip and side-port over the stomach. Known small bowel obstruction. Mild atelectasis at the right base. IMPRESSION: Enteric tube with tip in the mid stomach. Electronically Signed   By: Monte Fantasia M.D.   On: 11/27/2020 07:44   CT Angio Chest/Abd/Pel for Dissection W and/or Wo Contrast  Result Date: 11/27/2020 CLINICAL DATA:  Abdominal aortic dissection suspected. Presenting with abdominal pain. EXAM: CT ANGIOGRAPHY CHEST, ABDOMEN AND PELVIS TECHNIQUE: Non-contrast CT of the chest was initially obtained. Multidetector CT imaging through the chest, abdomen and pelvis was performed using the standard protocol during bolus administration of intravenous contrast. Multiplanar reconstructed images and MIPs were obtained and reviewed to evaluate the vascular anatomy. CONTRAST:  146mL OMNIPAQUE IOHEXOL 350 MG/ML SOLN COMPARISON:  CT abdomen 01/17/2019. FINDINGS: CTA CHEST FINDINGS Cardiovascular: Preferential opacification of the thoracic aorta. No evidence of thoracic aortic aneurysm or dissection. Mild atherosclerotic plaque of the thoracic aorta. Normal heart size. No significant pericardial effusion. At least mild four-vessel coronary artery calcifications. The main pulmonary artery is normal in caliber. Mediastinum/Nodes: No enlarged mediastinal, hilar, or axillary lymph nodes. Thyroid gland, trachea, and esophagus demonstrate no significant findings. Lungs/Pleura: Bilateral lower lobe subsegmental atelectasis. Linear atelectasis within the  lingula. Subpleural right micronodule that appears to be triangular in morphology likely represents an intrapulmonary lymph node (7:75). No pulmonary mass. No focal consolidation. No pleural effusion. No pneumothorax. Musculoskeletal: No chest wall abnormality. No suspicious lytic or blastic osseous lesions. No acute displaced fracture. CTA ABDOMEN AND PELVIS FINDINGS VASCULAR Aorta: Mild atherosclerotic plaque. Normal caliber aorta without aneurysm, dissection, vasculitis or significant stenosis. Celiac: Patent without evidence of aneurysm, dissection, vasculitis or significant stenosis. SMA: Patent without evidence of aneurysm, dissection, vasculitis or significant stenosis. Renals: Both renal arteries are patent without evidence of aneurysm, dissection, vasculitis, fibromuscular dysplasia or significant stenosis. IMA: Patent without evidence of aneurysm, dissection, vasculitis or significant stenosis. Inflow: Mild atherosclerotic plaque. Patent without evidence of aneurysm, dissection, vasculitis or significant stenosis. Veins: No obvious venous abnormality within the limitations of this arterial phase study. NON-VASCULAR Hepatobiliary: An 8 mm hypodensity within the right hepatic lobe is similar to prior likely represents a hepatic hemangioma. No new focal liver abnormality. No gallstones, gallbladder wall thickening, or pericholecystic fluid. No biliary dilatation. Pancreas: No focal lesion. Normal pancreatic contour. No surrounding inflammatory changes. No main pancreatic ductal dilatation. Spleen: Normal in size without focal abnormality. Adrenals/Urinary Tract: No adrenal nodule bilaterally. Bilateral kidneys enhance symmetrically. No hydronephrosis. No hydroureter. The urinary bladder is unremarkable. Stomach/Bowel: Stomach is within normal limits. Thickening of the terminal ileum. Multiple loops of mid to distal small bowel within the anterior mid and lower abdomen are distended with fluid with some  air-fluid levels noted. Surrounding mesenteric edema is noted. No evidence of large bowel wall thickening or dilatation. Scattered colonic diverticulosis. No pneumatosis. Status post appendectomy. Lymphatic: Right lower quadrant lymphadenopathy with as an example a 1.3 cm lymph node (6:197). Reproductive: Prostate is unremarkable. Other: Trace simple free fluid within the lower abdomen and pelvis. No intraperitoneal free gas. No organized fluid collection. Musculoskeletal: No abdominal wall hernia or abnormality. No suspicious lytic or blastic osseous lesions. No acute displaced fracture. Review of the MIP images confirms the above findings. IMPRESSION: 1. Thickening of the terminal ileum with associated proximal mid to distal small bowel obstruction. Findings could be due to an ileitis versus malignancy. Nonspecific mesenteric edema could be due to engorgement versus metastases. No associated bowel perforation. Recommend endoscopy for further evaluation. 2. Indeterminate right lower quadrant lymphadenopathy. 3. Scattered colonic diverticulosis with no acute diverticulitis. 4. Stable right hepatic lobe subcentimeter hyperdensity likely represents a hepatic hemangioma. 5. No acute vascular abnormality. Aortic Atherosclerosis (ICD10-I70.0) - mild. 6. No acute intrathoracic abnormality. Electronically Signed   By: Iven Finn M.D.   On: 11/27/2020 03:07    Anti-infectives: Anti-infectives (From admission, onward)   Start     Dose/Rate Route Frequency Ordered Stop   11/27/20 1430  piperacillin-tazobactam (ZOSYN) IVPB 3.375 g        3.375 g 12.5 mL/hr over 240 Minutes Intravenous Every 8 hours 11/27/20 1414     11/27/20 0330  cefTRIAXone (ROCEPHIN) 2 g in sodium chloride 0.9 % 100 mL IVPB       "  And" Linked Group Details   2 g 200 mL/hr over 30 Minutes Intravenous  Once 11/27/20 0329 11/27/20 0433   11/27/20 0330  metroNIDAZOLE (FLAGYL) IVPB 500 mg       "And" Linked Group Details   500 mg 100 mL/hr  over 60 Minutes Intravenous  Once 11/27/20 0329 11/27/20 0434       Assessment/Plan HLD Obesity BMI 34.17   SBO vs ileitis vs malignancy - PSH: appendectomy - CTA yesterday with no mesenteric vessel disease to explain his complaints; shows some dilation of mid to distal small bowel concerning for ileitis or small bowel obstruction, also mentions possibility of malignancy; no bowel perforation  ID - zosyn 3/16>> FEN - IVF, NPO, NGT clamped VTE - SCDs, lovenox Foley - none Follow up - TBD  Plan - Overall patient has improved and bowel function is returning. He is finishing up PO contrast for repeat CT scan now, will follow up on imaging. Keep NG tube clamped unless patient because nauseated, vomits, or has worsening abdominal pain/ bloating - then return to [facility].    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   Case discussed with Dr. [name]. Plan for surgery tomorrow (exploratory laparotomy, partial bowel section, possible ostomy, peritoneal biopsy). NPO after midnight. Check tumor markers.    LOS: 1 day    Wellington [place], [facility] 11/28/2020, 8:46 AM Please see Amion for pager number during day hours 7:00am-4:30pm

## 2020-11-29 ENCOUNTER — Encounter (HOSPITAL_COMMUNITY): Payer: Self-pay | Admitting: Family Medicine

## 2020-11-29 ENCOUNTER — Inpatient Hospital Stay (HOSPITAL_COMMUNITY): Payer: BC Managed Care – PPO | Admitting: Anesthesiology

## 2020-11-29 ENCOUNTER — Other Ambulatory Visit: Payer: Self-pay

## 2020-11-29 ENCOUNTER — Encounter (HOSPITAL_COMMUNITY)
Admission: EM | Disposition: A | Discharge status: Home / Self Care | Payer: Self-pay | Source: Home / Self Care | Attending: Family Medicine

## 2020-11-29 DIAGNOSIS — K56609 Unspecified intestinal obstruction, unspecified as to partial versus complete obstruction: Secondary | ICD-10-CM | POA: Diagnosis not present

## 2020-11-29 DIAGNOSIS — R112 Nausea with vomiting, unspecified: Secondary | ICD-10-CM

## 2020-11-29 HISTORY — PX: GASTROSTOMY: SHX5249

## 2020-11-29 HISTORY — PX: RECTAL BIOPSY: SHX2303

## 2020-11-29 HISTORY — PX: LAPAROTOMY: SHX154

## 2020-11-29 HISTORY — PX: BOWEL RESECTION: SHX1257

## 2020-11-29 HISTORY — PX: OSTOMY: SHX5997

## 2020-11-29 LAB — CBC
HCT: 39.2 % (ref 39.0–52.0)
Hemoglobin: 13.7 g/dL (ref 13.0–17.0)
MCH: 30.1 pg (ref 26.0–34.0)
MCHC: 34.9 g/dL (ref 30.0–36.0)
MCV: 86.2 fL (ref 80.0–100.0)
Platelets: 255 10*3/uL (ref 150–400)
RBC: 4.55 MIL/uL (ref 4.22–5.81)
RDW: 12.6 % (ref 11.5–15.5)
WBC: 7.8 10*3/uL (ref 4.0–10.5)
nRBC: 0 % (ref 0.0–0.2)

## 2020-11-29 LAB — BASIC METABOLIC PANEL
Anion gap: 7 (ref 5–15)
BUN: 7 mg/dL (ref 6–20)
CO2: 26 mmol/L (ref 22–32)
Calcium: 8.7 mg/dL — ABNORMAL LOW (ref 8.9–10.3)
Chloride: 102 mmol/L (ref 98–111)
Creatinine, Ser: 1.08 mg/dL (ref 0.61–1.24)
GFR, Estimated: >60 mL/min (ref >60)
Glucose, Bld: 93 mg/dL (ref 70–99)
Potassium: 3.7 mmol/L (ref 3.5–5.1)
Sodium: 135 mmol/L (ref 135–145)

## 2020-11-29 LAB — TYPE AND SCREEN
ABO/RH(D): B POS
Antibody Screen: NEGATIVE

## 2020-11-29 LAB — ABO/RH: ABO/RH(D): B POS

## 2020-11-29 SURGERY — LAPAROTOMY, EXPLORATORY
Anesthesia: General | Site: Abdomen

## 2020-11-29 MED ORDER — KETOROLAC TROMETHAMINE 15 MG/ML IJ SOLN
15.0000 mg | Freq: Three times a day (TID) | INTRAMUSCULAR | Status: DC
  Administered 2020-11-29 – 2020-12-03 (×11): 15 mg via INTRAVENOUS
  Filled 2020-11-29 (×12): qty 1

## 2020-11-29 MED ORDER — PROPOFOL 10 MG/ML IV BOLUS
INTRAVENOUS | Status: DC | PRN
  Administered 2020-11-29: 180 mg via INTRAVENOUS
  Administered 2020-11-29: 40 mg via INTRAVENOUS

## 2020-11-29 MED ORDER — 0.9 % SODIUM CHLORIDE (POUR BTL) OPTIME
TOPICAL | Status: DC | PRN
  Administered 2020-11-29 (×3): 1000 mL

## 2020-11-29 MED ORDER — LACTATED RINGERS IV SOLN: INTRAVENOUS | Status: DC

## 2020-11-29 MED ORDER — LACTATED RINGERS IV SOLN: INTRAVENOUS | Status: DC | PRN

## 2020-11-29 MED ORDER — SUGAMMADEX SODIUM 200 MG/2ML IV SOLN
INTRAVENOUS | Status: DC | PRN
  Administered 2020-11-29 (×2): 100 mg via INTRAVENOUS

## 2020-11-29 MED ORDER — MIDAZOLAM HCL 2 MG/2ML IJ SOLN
INTRAMUSCULAR | Status: AC
  Filled 2020-11-29: qty 2

## 2020-11-29 MED ORDER — FENTANYL CITRATE (PF) 250 MCG/5ML IJ SOLN
INTRAMUSCULAR | Status: AC
  Filled 2020-11-29: qty 5

## 2020-11-29 MED ORDER — MIDAZOLAM HCL 5 MG/5ML IJ SOLN
INTRAMUSCULAR | Status: DC | PRN
  Administered 2020-11-29: 2 mg via INTRAVENOUS

## 2020-11-29 MED ORDER — ROCURONIUM BROMIDE 10 MG/ML (PF) SYRINGE
PREFILLED_SYRINGE | INTRAVENOUS | Status: DC | PRN
  Administered 2020-11-29: 10 mg via INTRAVENOUS
  Administered 2020-11-29: 60 mg via INTRAVENOUS
  Administered 2020-11-29: 10 mg via INTRAVENOUS

## 2020-11-29 MED ORDER — DEXAMETHASONE SODIUM PHOSPHATE 10 MG/ML IJ SOLN
INTRAMUSCULAR | Status: DC | PRN
  Administered 2020-11-29: 5 mg via INTRAVENOUS

## 2020-11-29 MED ORDER — PROPOFOL 10 MG/ML IV BOLUS
INTRAVENOUS | Status: AC
  Filled 2020-11-29: qty 20

## 2020-11-29 MED ORDER — PROMETHAZINE HCL 25 MG/ML IJ SOLN: 6.2500 mg | INTRAMUSCULAR | Status: DC | PRN

## 2020-11-29 MED ORDER — CHLORHEXIDINE GLUCONATE 0.12 % MT SOLN
15.0000 mL | OROMUCOSAL | Status: AC
  Filled 2020-11-29: qty 15

## 2020-11-29 MED ORDER — METHOCARBAMOL 1000 MG/10ML IJ SOLN
500.0000 mg | Freq: Three times a day (TID) | INTRAVENOUS | Status: DC
  Administered 2020-11-29 – 2020-11-30 (×3): 500 mg via INTRAVENOUS
  Filled 2020-11-29: qty 5
  Filled 2020-11-29: qty 500
  Filled 2020-11-29: qty 5

## 2020-11-29 MED ORDER — PROPOFOL 10 MG/ML IV BOLUS
INTRAVENOUS | Status: AC
  Filled 2020-11-29: qty 40

## 2020-11-29 MED ORDER — FENTANYL CITRATE (PF) 100 MCG/2ML IJ SOLN
INTRAMUSCULAR | Status: AC
  Administered 2020-11-29: 50 ug via INTRAVENOUS
  Filled 2020-11-29: qty 2

## 2020-11-29 MED ORDER — OXYCODONE HCL 5 MG PO TABS
5.0000 mg | ORAL_TABLET | Freq: Once | ORAL | Status: DC | PRN
Start: 2020-11-29 — End: 2020-11-29

## 2020-11-29 MED ORDER — ENOXAPARIN SODIUM 40 MG/0.4ML ~~LOC~~ SOLN
40.0000 mg | Freq: Every day | SUBCUTANEOUS | Status: DC
  Administered 2020-11-30 – 2020-12-03 (×4): 40 mg via SUBCUTANEOUS
  Filled 2020-11-29 (×4): qty 0.4

## 2020-11-29 MED ORDER — LIDOCAINE 2% (20 MG/ML) 5 ML SYRINGE
INTRAMUSCULAR | Status: DC | PRN
  Administered 2020-11-29: 60 mg via INTRAVENOUS

## 2020-11-29 MED ORDER — ONDANSETRON HCL 4 MG/2ML IJ SOLN
INTRAMUSCULAR | Status: DC | PRN
  Administered 2020-11-29: 4 mg via INTRAVENOUS

## 2020-11-29 MED ORDER — SUCCINYLCHOLINE CHLORIDE 200 MG/10ML IV SOSY
PREFILLED_SYRINGE | INTRAVENOUS | Status: DC | PRN
  Administered 2020-11-29: 160 mg via INTRAVENOUS

## 2020-11-29 MED ORDER — MORPHINE SULFATE (PF) 2 MG/ML IV SOLN
1.0000 mg | INTRAVENOUS | Status: DC | PRN
  Administered 2020-11-29: 4 mg via INTRAVENOUS
  Administered 2020-11-29 (×2): 2 mg via INTRAVENOUS
  Administered 2020-11-29 – 2020-11-30 (×5): 4 mg via INTRAVENOUS
  Filled 2020-11-29: qty 1
  Filled 2020-11-29 (×5): qty 2
  Filled 2020-11-29: qty 1
  Filled 2020-11-29 (×2): qty 2

## 2020-11-29 MED ORDER — CHLORHEXIDINE GLUCONATE 0.12 % MT SOLN
OROMUCOSAL | Status: AC
  Filled 2020-11-29: qty 15

## 2020-11-29 MED ORDER — DEXMEDETOMIDINE (PRECEDEX) IN NS 20 MCG/5ML (4 MCG/ML) IV SYRINGE
PREFILLED_SYRINGE | INTRAVENOUS | Status: DC | PRN
  Administered 2020-11-29 (×2): 8 ug via INTRAVENOUS
  Administered 2020-11-29: 4 ug via INTRAVENOUS
  Administered 2020-11-29: 12 ug via INTRAVENOUS

## 2020-11-29 MED ORDER — OXYCODONE HCL 5 MG/5ML PO SOLN: 5.0000 mg | Freq: Once | ORAL | Status: DC | PRN

## 2020-11-29 MED ORDER — FENTANYL CITRATE (PF) 100 MCG/2ML IJ SOLN
25.0000 ug | INTRAMUSCULAR | Status: DC | PRN
  Administered 2020-11-29: 50 ug via INTRAVENOUS

## 2020-11-29 MED ORDER — MORPHINE SULFATE (PF) 4 MG/ML IV SOLN
4.0000 mg | Freq: Once | INTRAVENOUS | Status: AC
  Administered 2020-11-29: 4 mg via INTRAVENOUS
  Filled 2020-11-29: qty 1

## 2020-11-29 MED ORDER — FENTANYL CITRATE (PF) 250 MCG/5ML IJ SOLN
INTRAMUSCULAR | Status: DC | PRN
  Administered 2020-11-29: 100 ug via INTRAVENOUS
  Administered 2020-11-29 (×2): 25 ug via INTRAVENOUS
  Administered 2020-11-29 (×2): 50 ug via INTRAVENOUS

## 2020-11-29 SURGICAL SUPPLY — 58 items
APL PRP STRL LF DISP 70% ISPRP (MISCELLANEOUS) ×3
BLADE CLIPPER SURG (BLADE) IMPLANT
CANISTER SUCT 3000ML PPV (MISCELLANEOUS) ×4 IMPLANT
CATH GASTROSTOMY 20FR (CATHETERS) ×1 IMPLANT
CHLORAPREP W/TINT 26 (MISCELLANEOUS) ×4 IMPLANT
COVER SURGICAL LIGHT HANDLE (MISCELLANEOUS) ×4 IMPLANT
COVER WAND RF STERILE (DRAPES) ×3 IMPLANT
DECANTER SPIKE VIAL GLASS SM (MISCELLANEOUS) ×4 IMPLANT
DRAPE HALF SHEET 40X57 (DRAPES) ×3 IMPLANT
DRAPE LAPAROSCOPIC ABDOMINAL (DRAPES) ×3 IMPLANT
DRAPE UTILITY XL STRL (DRAPES) ×7 IMPLANT
DRAPE WARM FLUID 44X44 (DRAPES) ×3 IMPLANT
DRSG OPSITE POSTOP 4X10 (GAUZE/BANDAGES/DRESSINGS) ×1 IMPLANT
DRSG OPSITE POSTOP 4X8 (GAUZE/BANDAGES/DRESSINGS) IMPLANT
ELECT BLADE 6.5 EXT (BLADE) IMPLANT
ELECT CAUTERY BLADE 6.4 (BLADE) ×4 IMPLANT
ELECT REM PT RETURN 9FT ADLT (ELECTROSURGICAL) ×4
ELECTRODE REM PT RTRN 9FT ADLT (ELECTROSURGICAL) ×3 IMPLANT
GAUZE 4X4 16PLY RFD (DISPOSABLE) ×3 IMPLANT
GAUZE SPONGE 4X4 12PLY STRL (GAUZE/BANDAGES/DRESSINGS) ×4 IMPLANT
GLOVE BIO SURGEON STRL SZ7.5 (GLOVE) ×4 IMPLANT
GOWN STRL REUS W/ TWL LRG LVL3 (GOWN DISPOSABLE) ×6 IMPLANT
GOWN STRL REUS W/TWL LRG LVL3 (GOWN DISPOSABLE) ×8
HANDLE SUCTION POOLE (INSTRUMENTS) ×3 IMPLANT
KIT BASIN OR (CUSTOM PROCEDURE TRAY) ×4 IMPLANT
KIT SIGMOIDOSCOPE (SET/KITS/TRAYS/PACK) IMPLANT
KIT TURNOVER KIT B (KITS) ×4 IMPLANT
LIGASURE IMPACT 36 18CM CVD LR (INSTRUMENTS) ×1 IMPLANT
NDL HYPO 25GX1X1/2 BEV (NEEDLE) ×3 IMPLANT
NEEDLE HYPO 25GX1X1/2 BEV (NEEDLE) ×4 IMPLANT
NS IRRIG 1000ML POUR BTL (IV SOLUTION) ×8 IMPLANT
PACK GENERAL/GYN (CUSTOM PROCEDURE TRAY) ×3 IMPLANT
PACK LITHOTOMY IV (CUSTOM PROCEDURE TRAY) ×3 IMPLANT
PAD ARMBOARD 7.5X6 YLW CONV (MISCELLANEOUS) ×8 IMPLANT
PENCIL BUTTON HOLSTER BLD 10FT (ELECTRODE) ×3 IMPLANT
PENCIL SMOKE EVACUATOR (MISCELLANEOUS) ×4 IMPLANT
SPECIMEN JAR LARGE (MISCELLANEOUS) IMPLANT
SPONGE LAP 18X18 RF (DISPOSABLE) ×1 IMPLANT
SPONGE SURGIFOAM ABS GEL 12-7 (HEMOSTASIS) IMPLANT
STAPLER VISISTAT 35W (STAPLE) ×4 IMPLANT
SUCTION POOLE HANDLE (INSTRUMENTS) ×4
SURGILUBE 2OZ TUBE FLIPTOP (MISCELLANEOUS) ×4 IMPLANT
SUT CHROMIC 2 0 SH (SUTURE) IMPLANT
SUT PDS AB 1 TP1 96 (SUTURE) ×8 IMPLANT
SUT SILK 2 0 SH CR/8 (SUTURE) ×4 IMPLANT
SUT SILK 2 0 TIES 10X30 (SUTURE) ×4 IMPLANT
SUT SILK 3 0 SH CR/8 (SUTURE) ×4 IMPLANT
SUT SILK 3 0 TIES 10X30 (SUTURE) ×4 IMPLANT
SUT VIC AB 3-0 SH 18 (SUTURE) IMPLANT
SYR BULB EAR ULCER 3OZ GRN STR (SYRINGE) ×4 IMPLANT
SYR CONTROL 10ML LL (SYRINGE) ×4 IMPLANT
TAPE CLOTH 4X10 WHT NS (GAUZE/BANDAGES/DRESSINGS) ×1 IMPLANT
TOWEL GREEN STERILE (TOWEL DISPOSABLE) ×4 IMPLANT
TOWEL GREEN STERILE FF (TOWEL DISPOSABLE) ×4 IMPLANT
TRAY FOLEY MTR SLVR 16FR STAT (SET/KITS/TRAYS/PACK) IMPLANT
TUBE CONNECTING 12X1/4 (SUCTIONS) ×4 IMPLANT
UNDERPAD 30X36 HEAVY ABSORB (UNDERPADS AND DIAPERS) ×4 IMPLANT
YANKAUER SUCT BULB TIP NO VENT (SUCTIONS) ×4 IMPLANT

## 2020-11-29 NOTE — Anesthesia Preprocedure Evaluation (Addendum)
Anesthesia Evaluation  Patient identified by MRN, date of birth, ID band Patient awake    Reviewed: Allergy & Precautions, NPO status , Patient's Chart, lab work & pertinent test results  History of Anesthesia Complications Negative for: history of anesthetic complications  Airway Mallampati: II  TM Distance: >3 FB Neck ROM: Full    Dental  (+) Dental Advisory Given, Teeth Intact,    Pulmonary neg pulmonary ROS,    breath sounds clear to auscultation       Cardiovascular negative cardio ROS   Rhythm:Regular     Neuro/Psych negative neurological ROS  negative psych ROS   GI/Hepatic Neg liver ROS,  Partial SBO related to possible malignancy    Endo/Other   Obesity   Renal/GU negative Renal ROS     Musculoskeletal  (+) Arthritis ,   Abdominal   Peds  Hematology negative hematology ROS (+)   Anesthesia Other Findings Covid test negative   Reproductive/Obstetrics                           Anesthesia Physical Anesthesia Plan  ASA: II  Anesthesia Plan: General   Post-op Pain Management:    Induction: Intravenous and Rapid sequence  PONV Risk Score and Plan: 4 or greater and Treatment may vary due to age or medical condition, Ondansetron, Midazolam, Scopolamine patch - Pre-op and Dexamethasone  Airway Management Planned: Oral ETT  Additional Equipment: None  Intra-op Plan:   Post-operative Plan: Extubation in OR  Informed Consent: I have reviewed the patients History and Physical, chart, labs and discussed the procedure including the risks, benefits and alternatives for the proposed anesthesia with the patient or authorized representative who has indicated his/her understanding and acceptance.     Dental advisory given  Plan Discussed with: CRNA and Anesthesiologist  Anesthesia Plan Comments:        Anesthesia Quick Evaluation

## 2020-11-29 NOTE — Consult Note (Addendum)
[name]  [phone] [phone]  ID: [name] OB: April 02, 1966 MR#: [id number]#:[id number] PCP: Ricardo Pollen, MD  CHIEF COMPLAINT: Mass of the ileum with regional lymphadenopathy with omental and peritoneal nodularity/caking consistent with metastatic cancer  INTERVAL HISTORY: [name] is a 55 year old male from [name].  He has a past medical history significant for hyperlipidemia.  He presented to the emergency room with a 7-monthhistory of abdominal pain that was worsening.  On the day that he came to the [name], he reported his pain was 10/10 and described the pain as ripping and tearing.  The pain also radiated to the left upper back.  A CTA chest was obtained which showed thickening of the terminal ileum with associated proximal mid to distal small bowel obstruction and indeterminate right lower quadrant lymphadenopathy.  A CT of the abdomen/pelvis was also obtained which showed a masslike circumferential thickening of the terminal ileum and cecal base near the ileocecal valve and abnormally enlarged lymph nodes in the right lower quadrant mesentery adjacent to the terminal ileum measuring 2.3 x 1.4 cm, extensive omental and peritoneal nodularity and caking throughout the abdomen, small volume perihepatic and perisplenic ascites.  The patient was taken to the OR today for exploratory laparotomy, omental biopsy, peritoneal biopsy, and insertion of gastrostomy tube.  REVIEW OF SYSTEMS: The patient recently returned from surgery.  His wife is at the bedside.  He is still having significant abdominal pain.  He is receiving Robaxin and just received a dose of IV morphine just prior to my visit.  Despite these interventions, he is still having significant pain.  The patient tells me that he has not had any recent loss of appetite or weight loss.  He has otherwise been feeling quite well and has not had any headaches, dizziness,  chest pain, shortness of breath, nausea, vomiting.  He had constipation when he presented to the [name] that has now resolved.  No bleeding reported.  The remainder of the review of systems is noncontributory.  PAST MEDICAL HISTORY: Past Medical History:  Diagnosis Date   Arthritis    Family history of adverse reaction to anesthesia    mother had problem with it due to her asthma   PAST SURGICAL HISTORY: Past Surgical History:  Procedure Laterality Date   LAPAROSCOPIC APPENDECTOMY N/A 01/17/2019   Procedure: APPENDECTOMY LAPAROSCOPIC;  Surgeon: Ricardo Ok MD;  Location: [name];  Laterality: N/A;   SHOULDER SURGERY Left 09/19/2015   FAMILY HISTORY The patient reports that he has a sister with stage IV breast cancer.  She is currently residing in a skilled facility.  His mother was adopted but did not have any history of cancer.  His father died from complications due to [place]  Family History  Problem Relation Age of Onset   Cancer Sister    Cancer Brother    High Cholesterol Brother    Colon cancer Neg Hx    Pancreatic cancer Neg Hx    Liver disease Neg Hx    Esophageal cancer Neg Hx    Stomach cancer Neg Hx    SOCIAL HISTORY: [name]  Ricardo Cowboypresent in the room 11/30/2022 during visit, is his secopnd wife. He has 1 biological child from an earlier marriage, [name] who lives with them. The patient teaches business at [name]  He bikes 20-25 miles most daysDenies history of alcohol and tobacco use.  ADVANCED DIRECTIVES:  [name] patient's wife is his HCPOA  HEALTH MAINTENANCE: Social History   Tobacco Use   Smoking status: Never Smoker   Smokeless tobacco: Never Used  Scientific laboratory technician Use: Never used  Substance Use Topics   Alcohol use: Not Currently    Comment: rare   Drug use: Never   Colonoscopy: No history of colonoscopy.  Was scheduled to have a colonoscopy in April  2022. Bone density: Lipid panel: No Known Allergies Current Facility-Administered Medications  Medication Dose Route Frequency Provider Last Rate Last Admin   acetaminophen (TYLENOL) tablet 650 mg  650 mg Oral Q6H PRN Ricardo Regal, PA-C       Or   acetaminophen (TYLENOL) suppository 650 mg  650 mg Rectal Q6H PRN Ricardo Regal, PA-C       Chlorhexidine Gluconate Cloth 2 % PADS 6 each  6 each Topical Once Ricardo Kussmaul, MD       [START ON 11/30/2020] enoxaparin (LOVENOX) injection 40 mg  40 mg Subcutaneous Daily Ricardo Regal, PA-C       ketorolac (TORADOL) 15 MG/ML injection 15 mg  15 mg Intravenous Q8H Ricardo Regal, PA-C   15 mg at 11/29/20 1419   lactated ringers infusion   Intravenous Continuous Ricardo Regal, PA-C 125 mL/hr at 11/29/20 0444 New Bag at 11/29/20 0444   lactated ringers infusion   Intravenous Continuous Ricardo Regal, PA-C 800 mL/hr at 11/29/20 1129 Rate Change at 11/29/20 1129   methocarbamol (ROBAXIN) 500 mg in dextrose 5 % 50 mL IVPB  500 mg Intravenous Q8H Ricardo Regal, PA-C       morphine 2 MG/ML injection 1-4 mg  1-4 mg Intravenous Q1H PRN Ricardo Regal, PA-C   2 mg at 11/29/20 1249   ondansetron (ZOFRAN) tablet 4 mg  4 mg Oral Q6H PRN Ricardo Regal, PA-C       Or   ondansetron [facility]) injection 4 mg  4 mg Intravenous Q6H PRN Ricardo Regal, PA-C   4 mg at 11/29/20 1247   OBJECTIVE: Laying in the bed, mild distress secondary to pain Vitals:   11/29/20 1240 11/29/20 1330  BP: 126/81 137/80  Pulse: 77 80  Resp: 16 18  Temp: 98.8 F (37.1 C)   SpO2: 92%    Body mass index is 34.17 kg/m. ECOG FS:1 - Symptomatic but completely ambulatory Ocular: Sclerae unicteric, pupils equal, round and reactive to light Ear-nose-throat: Oropharynx clear, dentition fair Lymphatic: No cervical or supraclavicular adenopathy Lungs no rales or rhonchi, good excursion bilaterally Heart regular rate and rhythm, no murmur  appreciated Abd, soft, honeycomb dressing without drainage, gastrostomy tube in place, I did not palpate his abdomen due to surgery earlier today and significant pain. MSK no focal spinal tenderness, no joint edema Neuro: non-focal, well-oriented, appropriate affect  LAB RESULTS: CMP     Component Value Date/Time   NA 135 11/29/2020 0150   NA 138 09/04/2020 0911   K 3.7 11/29/2020 0150   CL 102 11/29/2020 0150   CO2 26 11/29/2020 0150   GLUCOSE 93 11/29/2020 0150   BUN 7 11/29/2020 0150   BUN 15 09/04/2020 0911   CREATININE 1.08 11/29/2020 0150   CALCIUM 8.7 (L) 11/29/2020 0150   PROT 8.0 11/27/2020 0022   PROT 7.3 09/04/2020 0911   ALBUMIN 4.3 11/27/2020 0022   ALBUMIN 4.6 09/04/2020 0911   AST 22 11/27/2020 0022   ALT 21 11/27/2020 0022   ALKPHOS 85 11/27/2020 0022   BILITOT 0.9 11/27/2020 0022   BILITOT  0.6 09/04/2020 0911   GFRNONAA >60 11/29/2020 0150   GFRAA 88 09/04/2020 0911   INo results found for: SPEP, UPEP Lab Results  Component Value Date   WBC 7.8 11/29/2020   NEUTROABS 4.6 09/04/2020   HGB 13.7 11/29/2020   HCT 39.2 11/29/2020   MCV 86.2 11/29/2020   PLT 255 11/29/2020   _0 @ No results found for: LABCA2 No components found for: LABCA125 No results for input(s): INR in the last 168 hours. Urinalysis    Component Value Date/Time   COLORURINE YELLOW 11/27/2020 0612   APPEARANCEUR CLEAR 11/27/2020 0612   LABSPEC >1.046 (H) 11/27/2020 0612   PHURINE 5.0 11/27/2020 0612   GLUCOSEU NEGATIVE 11/27/2020 0612   HGBUR NEGATIVE 11/27/2020 0612   BILIRUBINUR NEGATIVE 11/27/2020 0612   KETONESUR 5 (A) 11/27/2020 0612   PROTEINUR NEGATIVE 11/27/2020 0612   NITRITE NEGATIVE 11/27/2020 0612   LEUKOCYTESUR NEGATIVE 11/27/2020 0612   STUDIES: DG Abdomen 1 View  Result Date: 11/27/2020 CLINICAL DATA:  Nasogastric tube placement EXAM: ABDOMEN - 1 VIEW COMPARISON:  CT from earlier today FINDINGS: Enteric tube with tip and side-port over the stomach.  Known small bowel obstruction. Mild atelectasis at the right base. IMPRESSION: Enteric tube with tip in the mid stomach. Electronically Signed   By: Monte Fantasia M.D.   On: 11/27/2020 07:44   CT ABDOMEN PELVIS W CONTRAST  Result Date: 11/28/2020 CLINICAL DATA:  Bowel obstruction suspected EXAM: CT ABDOMEN AND PELVIS WITH CONTRAST TECHNIQUE: Multidetector CT imaging of the abdomen and pelvis was performed using the standard protocol following bolus administration of intravenous contrast. CONTRAST:  143m OMNIPAQUE IOHEXOL 300 MG/ML SOLN, additional oral enteric contrast COMPARISON:  Abdominal radiographs, 11/27/2020, CT chest abdomen pelvis angiogram, 11/27/2020 CT abdomen pelvis, 01/17/2019 FINDINGS: Lower chest: Atelectasis or consolidation of the dependent bilateral lung bases, new compared to prior examination. Hepatobiliary: No solid liver abnormality is seen. No gallstones, gallbladder wall thickening, or biliary dilatation. Pancreas: Unremarkable. No pancreatic ductal dilatation or surrounding inflammatory changes. Spleen: Normal in size without significant abnormality. Adrenals/Urinary Tract: Adrenal glands are unremarkable. Kidneys are normal, without renal calculi, solid lesion, or hydronephrosis. Bladder is unremarkable. Stomach/Bowel: Esophagogastric tube with tip and side port below the diaphragm. Stomach is within normal limits. Appendix is surgically absent. The small bowel is generally decompressed, with full some fluid-filled, nondistended loops throughout. There is transit of oral enteric contrast to the terminal ileum. There is masslike, circumferential thickening of the terminal ileum and cecal base near the ileocecal valve (series 8, image 82, series 5, image 57). Sigmoid diverticulosis. Vascular/Lymphatic: Aortic atherosclerosis. There are abnormally enlarged lymph nodes in the right lower quadrant mesentery adjacent to the terminal ileum measuring up to 2.3 x 1.4 cm (series 5, image 52).  Reproductive: No mass or other significant abnormality. Other: Small, fat containing left inguinal hernia. There is small volume perihepatic and perisplenic ascites. There is extensive omental and peritoneal nodularity and caking (series 5, image 38, 72). Musculoskeletal: No acute or significant osseous findings. IMPRESSION: 1. There is masslike, circumferential thickening of the terminal ileum and cecal base near the ileocecal valve and abnormally enlarged lymph nodes in the right lower quadrant mesentery adjacent to the terminal ileum measuring up to 2.3 x 1.4 cm. 2. There is extensive omental and peritoneal nodularity and caking throughout the abdomen. 3. Findings are highly concerning for primary colon malignancy with nodal and peritoneal metastatic disease. 4. Small volume perihepatic and perisplenic ascites. 5. The small bowel is generally decompressed, with full some  fluid-filled, nondistended loops throughout. There is transit of oral enteric contrast to the terminal ileum. No evidence of overt bowel obstruction at this time. Esophagogastric tube is position with tip and side port below the diaphragm. 6. Atelectasis or consolidation of the dependent bilateral lung bases, new compared to prior examination. Aortic Atherosclerosis (ICD10-I70.0). Electronically Signed   By: Eddie Candle M.D.   On: 11/28/2020 11:08   CT Angio Chest/Abd/Pel for Dissection W and/or Wo Contrast  Result Date: 11/27/2020 CLINICAL DATA:  Abdominal aortic dissection suspected. Presenting with abdominal pain. EXAM: CT ANGIOGRAPHY CHEST, ABDOMEN AND PELVIS TECHNIQUE: Non-contrast CT of the chest was initially obtained. Multidetector CT imaging through the chest, abdomen and pelvis was performed using the standard protocol during bolus administration of intravenous contrast. Multiplanar reconstructed images and MIPs were obtained and reviewed to evaluate the vascular anatomy. CONTRAST:  174m OMNIPAQUE IOHEXOL 350 MG/ML SOLN  COMPARISON:  CT abdomen 01/17/2019. FINDINGS: CTA CHEST FINDINGS Cardiovascular: Preferential opacification of the thoracic aorta. No evidence of thoracic aortic aneurysm or dissection. Mild atherosclerotic plaque of the thoracic aorta. Normal heart size. No significant pericardial effusion. At least mild four-vessel coronary artery calcifications. The main pulmonary artery is normal in caliber. Mediastinum/Nodes: No enlarged mediastinal, hilar, or axillary lymph nodes. Thyroid gland, trachea, and esophagus demonstrate no significant findings. Lungs/Pleura: Bilateral lower lobe subsegmental atelectasis. Linear atelectasis within the lingula. Subpleural right micronodule that appears to be triangular in morphology likely represents an intrapulmonary lymph node (7:75). No pulmonary mass. No focal consolidation. No pleural effusion. No pneumothorax. Musculoskeletal: No chest wall abnormality. No suspicious lytic or blastic osseous lesions. No acute displaced fracture. CTA ABDOMEN AND PELVIS FINDINGS VASCULAR Aorta: Mild atherosclerotic plaque. Normal caliber aorta without aneurysm, dissection, vasculitis or significant stenosis. Celiac: Patent without evidence of aneurysm, dissection, vasculitis or significant stenosis. SMA: Patent without evidence of aneurysm, dissection, vasculitis or significant stenosis. Renals: Both renal arteries are patent without evidence of aneurysm, dissection, vasculitis, fibromuscular dysplasia or significant stenosis. IMA: Patent without evidence of aneurysm, dissection, vasculitis or significant stenosis. Inflow: Mild atherosclerotic plaque. Patent without evidence of aneurysm, dissection, vasculitis or significant stenosis. Veins: No obvious venous abnormality within the limitations of this arterial phase study. NON-VASCULAR Hepatobiliary: An 8 mm hypodensity within the right hepatic lobe is similar to prior likely represents a hepatic hemangioma. No new focal liver abnormality. No  gallstones, gallbladder wall thickening, or pericholecystic fluid. No biliary dilatation. Pancreas: No focal lesion. Normal pancreatic contour. No surrounding inflammatory changes. No main pancreatic ductal dilatation. Spleen: Normal in size without focal abnormality. Adrenals/Urinary Tract: No adrenal nodule bilaterally. Bilateral kidneys enhance symmetrically. No hydronephrosis. No hydroureter. The urinary bladder is unremarkable. Stomach/Bowel: Stomach is within normal limits. Thickening of the terminal ileum. Multiple loops of mid to distal small bowel within the anterior mid and lower abdomen are distended with fluid with some air-fluid levels noted. Surrounding mesenteric edema is noted. No evidence of large bowel wall thickening or dilatation. Scattered colonic diverticulosis. No pneumatosis. Status post appendectomy. Lymphatic: Right lower quadrant lymphadenopathy with as an example a 1.3 cm lymph node (6:197). Reproductive: Prostate is unremarkable. Other: Trace simple free fluid within the lower abdomen and pelvis. No intraperitoneal free gas. No organized fluid collection. Musculoskeletal: No abdominal wall hernia or abnormality. No suspicious lytic or blastic osseous lesions. No acute displaced fracture. Review of the MIP images confirms the above findings. IMPRESSION: 1. Thickening of the terminal ileum with associated proximal mid to distal small bowel obstruction. Findings could be due to an  ileitis versus malignancy. Nonspecific mesenteric edema could be due to engorgement versus metastases. No associated bowel perforation. Recommend endoscopy for further evaluation. 2. Indeterminate right lower quadrant lymphadenopathy. 3. Scattered colonic diverticulosis with no acute diverticulitis. 4. Stable right hepatic lobe subcentimeter hyperdensity likely represents a hepatic hemangioma. 5. No acute vascular abnormality. Aortic Atherosclerosis (ICD10-I70.0) - mild. 6. No acute intrathoracic abnormality.  Electronically Signed   By: Iven Finn M.D.   On: 11/27/2020 03:07   ASSESSMENT: 55 y.o. [place], [place] male with a mass at the terminal ileum with omental and peritoneal nodularity and caking concerning for malignancy.  PLAN: I discussed the CT scan findings with the patient and his wife.  He is aware that findings are concerning for malignancy.  We discussed that we need his biopsy results back to determine the origin of these abnormal CT scan findings.  Findings are suspicious for GI malignancy.  He is anxious to know the next steps but understands that we will need to have the biopsy results back to have a more detailed discussion of treatment options.  We are hopeful to have preliminary results back early next week and will communicate these results to the patient when they are available.  A CEA has been drawn and is currently pending.  Recommend ongoing pain control per [name] and primary team.  Mikey Bussing, NP 11/29/2020 2:29 PM    ADDENDUM:  Met with Mr Berrocal and his wife and oriented them to his situation. 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 Carcinomas who tend to do this include appendix and gallbladder. However we have no preliminary information on cell type and it may be Monday before we know more. I will add a PSA and CA-19 to tumor markers .  If you feel he is ready for discharge over the weekend that is fine from our point of view-- I will make sure to follow up on the pathology and will get Mr Minahan an appointment at our [facility] as soon as we have enough information to know which one of our subspecialists he shuld be directed to.  I personally saw this patient and performed a substantive portion of this encounter with the listed APP documented above.   [name], MD Medical Oncology and Hematology [facility] [address] [place], [place] [address] Tel. [phone]    Fax. [phone]

## 2020-11-29 NOTE — Op Note (Signed)
11/29/2020  11:23 AM  PATIENT:  [name]  55 y.o. male  PRE-OPERATIVE DIAGNOSIS:  Mass of Ileum  POST-OPERATIVE DIAGNOSIS:  Metastatic disease involving all mesentery of small intestine, epiploic appendages of colon, omentum, pelvis, peritoneal surfaces  PROCEDURE:  Procedure(s) with comments: EXPLORATORY LAPAROTOMY  OMENTAL BIOPSY PERITIONEAL BIOPSY  INSERTION OF GASTROSTOMY TUBE   SURGEON:  Surgeon(s) and Role:    * Jovita Kussmaul, MD - Primary  PHYSICIAN ASSISTANT:   ASSISTANTS: Judyann Munson, RNFA   ANESTHESIA:   general  EBL:  150 mL   BLOOD ADMINISTERED:none  DRAINS: Gastrostomy Tube   LOCAL MEDICATIONS USED:  NONE  SPECIMEN:  Source of Specimen:  peritoneal and omental biopsy  DISPOSITION OF SPECIMEN:  PATHOLOGY  COUNTS:  YES  TOURNIQUET:  * No tourniquets in log *  DICTATION: .Dragon Dictation   After informed consent was obtained the patient was brought to the operating room and placed in the supine position on the operating table.  After adequate induction of general anesthesia the patient's abdomen was prepped with ChloraPrep, allowed to dry, and draped in usual sterile manner.  An appropriate timeout was performed.  A midline incision was made with a 10 blade knife.  The incision was carried through the skin and subcutaneous tissue sharply with the electrocautery until the linea alba identified.  The linea alba was also incised with the electrocautery.  The preperitoneal space was probed bluntly with a hemostat until the peritoneum was opened and access was gained to the abdominal cavity.  The rest of the incision was opened under direct vision.  Upon entering the abdominal cavity there was obvious nodular disease involving the peritoneal surface of the anterior abdominal wall.  A portion of this was removed and sent to pathology for frozen section.  On further inspection of the abdomen the pelvis was completely full of tumor.  The small bowel was run  from the ligament of Treitz to the ileocecal valve and all of the mesenteric surfaces of the small bowel were involved with tumor.  The thickened area of the terminal ileum was fixed to the retroperitoneum with a large area of tumor but was not obviously obstructed.  The epiploic appendages of all of the colon were full of tumor.  The omentum was full of tumor.  Th liver and anterior surface of the stomach were free of tumor implants.  At this point I elected to place a gastrostomy tube.  A site was chosen on the anterior surface of the stomach.  A 2-0 silk pursestring stitch was placed in the anterior wall of the stomach.  A small opening was made into the stomach in the middle of the pursestring stitch with the electrocautery.  A 20 French gastrostomy tube was brought through the anterior abdominal wall and was placed into the stomach.  The pursestring stitch was cinched down and tied.  The anterior wall the stomach was then tacked to the anterior abdominal wall around the tube entry site.  The balloon was then filled with about 8 cc of water and was pulled up close to the anterior abdominal wall without any tension.  The tube was at 8 cm at the skin.  The tube was anchored to the skin with multiple 2-0 silk and nylon stitches.  A large wedge of omentum was removed sharply with the LigaSure and sent to pathology for further evaluation.  At this point the abdomen was irrigated with copious amounts of saline.  The fascia of  the anterior abdominal wall was closed with 2 running #1 double-stranded looped PDS sutures.  The subcutaneous tissue was irrigated with saline and the skin was closed with staples.  Sterile dressings were applied.  The patient tolerated the procedure well.  At the end of the case all needle sponge and instrument counts were correct.  The patient was then awakened and taken to recovery in stable condition.  PLAN OF CARE: Admit to inpatient   PATIENT DISPOSITION:  PACU - hemodynamically  stable.   Delay start of Pharmacological VTE agent (>24hrs) due to surgical blood loss or risk of bleeding: no

## 2020-11-29 NOTE — Transfer of Care (Signed)
Immediate Anesthesia Transfer of Care Note  Patient: [name]  Procedure(s) Performed: EXPLORATORY LAPAROTOMY (N/A Abdomen) PARTIAL BOWEL RESECTION (N/A ) POSSIBLE OSTOMY CREATION (N/A Abdomen) PERITIONEAL BIOPSY (N/A Abdomen) INSERTION OF GASTROSTOMY TUBE (Left Abdomen)  Patient Location: PACU  Anesthesia Type:General  Level of Consciousness: drowsy  Airway & Oxygen Therapy: Patient Spontanous Breathing and Patient connected to nasal cannula oxygen  Post-op Assessment: Report given to RN and Post -op Vital signs reviewed and stable  Post vital signs: Reviewed and stable  Last Vitals:  Vitals Value Taken Time  BP 117/79 11/29/20 1135  Temp 36.4 C 11/29/20 1135  Pulse 76 11/29/20 1143  Resp 17 11/29/20 1143  SpO2 93 % 11/29/20 1143  Vitals shown include unvalidated device data.  Last Pain:  Vitals:   11/29/20 0600  TempSrc:   PainSc: 6       Patients Stated Pain Goal: 0 (11/88/67 7373)  Complications: No complications documented.

## 2020-11-29 NOTE — Anesthesia Procedure Notes (Signed)
Procedure Name: Intubation Date/Time: 11/29/2020 9:57 AM Performed by: Imagene Riches, CRNA Pre-anesthesia Checklist: Patient identified, Emergency Drugs available, Suction available and Patient being monitored Patient Re-evaluated:Patient Re-evaluated prior to induction Oxygen Delivery Method: Circle System Utilized Preoxygenation: Pre-oxygenation with 100% oxygen Induction Type: IV induction, Rapid sequence and Cricoid Pressure applied Laryngoscope Size: Miller and 2 Grade View: Grade I Tube type: Oral Tube size: 7.5 mm Number of attempts: 1 Airway Equipment and Method: Stylet and Oral airway Placement Confirmation: ETT inserted through vocal cords under direct vision,  positive ETCO2 and breath sounds checked- equal and bilateral Secured at: 22 cm Tube secured with: Tape Dental Injury: Teeth and Oropharynx as per pre-operative assessment

## 2020-11-29 NOTE — Interval H&P Note (Signed)
History and Physical Interval Note:  11/29/2020 9:04 AM  Ricardo [name]  has presented today for surgery, with the diagnosis of Mass of Ileum.  The various methods of treatment have been discussed with the patient and family. After consideration of risks, benefits and other options for treatment, the patient has consented to  Procedure(s) with comments: EXPLORATORY LAPAROTOMY (N/A) - PUT CASE IN ROOM 1 STARTING AT 9:30AM FOR 120 MIN PARTIAL BOWEL RESECTION (N/A) POSSIBLE ILEOSTOMY (N/A) Peritoneal BIOPSY (N/A) as a surgical intervention.  The patient's history has been reviewed, patient examined, no change in status, stable for surgery.  I have reviewed the patient's chart and labs.  Questions were answered to the patient's satisfaction.     Autumn Messing III

## 2020-11-29 NOTE — Progress Notes (Signed)
PROGRESS NOTE  Ricardo [name]  MGQ:[id number] DOB: 07/11/1966 DOA: 11/27/2020 PCP: [name], MD  Outpatient Specialists: [facility] GI Brief Narrative: Ricardo [name] is a 55 y.o. male with a history of HLD, laparoscopic appendectomy May 2020, and several weeks of eructation, intermittent central abdominal tenderness and cramping who presented to the ED 3/16 with worsening, severe pain reported to radiate to the back associated with nausea and vomiting and decreased stool output. WBC 12.8k, CTA chest/abd/pelvis showed no dissection but revealed terminal ileum thickening with suggested small bowel obstruction. CT abd/pelvis with PO and IV contrast is delayed by recent dye load until 3/17 AM per surgery recommendations. NGT was inserted and symptoms have improved. Repeat CT with contrast reveals circumferential thickening of the ileum with regional lymphadenopathy and peritoneal nodularity and caking suggestive of new diagnosis of malignancy. Surgery is planning exploratory laparotomy 3/18.   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  - Postoperative recommendations per surgery - Tumor markers pending. Will plan to consult oncology once tissue diagnosis is defined. Gravity of suspected diagnosis discussed, though pt is resolute to get definitive Dx and initiate Tx.  Partial SBO: Due to mass above.  - Symptoms have improved and radiographic appearance is less severe with decompression. Still with NGT, though bowel function had returned.  Obesity: Estimated body mass index is 34.17 kg/m as calculated from the following:   Height as of this encounter: 5\' 11"  (1.803 m).   Weight as  of this encounter: 111.1 kg.  DVT prophylaxis: Lovenox Code Status: Full Family Communication: Wife at bedside this AM prior to surgery Disposition Plan:  Status is: Inpatient  Remains inpatient appropriate because:Ongoing diagnostic testing needed not appropriate for outpatient work up  Dispo: The patient is from: Home              Anticipated d/c is to: Home              Patient currently is not medically stable to d/c.  Consultants:   General surgery  Procedures:   NG tube insertion  Exploratory laparotomy 3/18  Antimicrobials:  Zosyn   Subjective: Still with bowel movements, abdominal pain is improved, no vomiting. Tolerated clears but has been NPO p MN for surgery today.   Objective: Vitals:   11/28/20 1701 11/29/20 0030 11/29/20 0552 11/29/20 0923  BP: 139/89 124/82 129/83   Pulse: 75 72 77   Resp: 20 18 18    Temp: 98.4 F (36.9 C) 98.6 F (37 C) 98.2 F (36.8 C)   TempSrc: Oral Oral Oral   SpO2: 97% 95% 96%   Weight:    111.1 kg  Height:    5\' 11"  (1.803 m)    Intake/Output Summary (Last 24 hours) at 11/29/2020 1112 Last data filed at 11/29/2020 1022 Gross per 24 hour  Intake -  Output 150 ml  Net -150 ml   Filed Weights   11/27/20 0018 11/29/20 0923  Weight: 111.1 kg 111.1 kg   Gen: 55 y.o. male in no distress HEENT: NGT clamped Pulm: Nonlabored breathing room air. Clear. CV: Regular rate and rhythm. No murmur, rub, or gallop. No JVD, no dependent edema. GI: Abdomen soft, protuberant, not significantly tender, distant bowel sounds.  Ext:  Warm, no deformities Skin: No rashes, lesions or ulcers on visualized skin. Neuro: Alert and oriented. No focal neurological deficits. Psych: Judgement and insight appear fair. Mood euthymic & affect congruent. Behavior is appropriate.    Data Reviewed: I have personally reviewed following labs and imaging studies  CBC: Recent Labs  Lab 11/27/20 0022 11/27/20 0612 11/28/20 0158 11/29/20 0150  WBC  12.8* 11.0* 7.3 7.8  HGB 15.7 14.7 14.0 13.7  HCT 44.9 43.6 41.7 39.2  MCV 86.8 88.4 89.1 86.2  PLT 330 276 273 767   Basic Metabolic Panel: Recent Labs  Lab 11/27/20 0022 11/27/20 0612 11/28/20 0158 11/29/20 0150  NA 136  --  138 135  K 4.3  --  4.0 3.7  CL 103  --  103 102  CO2 25  --  26 26  GLUCOSE 112*  --  99 93  BUN 11  --  10 7  CREATININE 1.25* 1.16 1.26* 1.08  CALCIUM 9.5  --  8.6* 8.7*   GFR: Estimated Creatinine Clearance: 97.9 mL/min (by C-G formula based on SCr of 1.08 mg/dL). Liver Function Tests: Recent Labs  Lab 11/27/20 0022  AST 22  ALT 21  ALKPHOS 85  BILITOT 0.9  PROT 8.0  ALBUMIN 4.3   Recent Labs  Lab 11/27/20 0022  LIPASE 34   No results for input(s): AMMONIA in the last 168 hours. Coagulation Profile: No results for input(s): INR, PROTIME in the last 168 hours. Cardiac Enzymes: No results for input(s): CKTOTAL, CKMB, CKMBINDEX, TROPONINI in the last 168 hours. BNP (last 3 results) No results for input(s): PROBNP in the last 8760 hours. HbA1C: No results for input(s): HGBA1C in the last 72 hours. CBG: No results for input(s): GLUCAP in the last 168 hours. Lipid Profile: No results for input(s): CHOL, HDL, LDLCALC, TRIG, CHOLHDL, LDLDIRECT in the last 72 hours. Thyroid Function Tests: No results for input(s): TSH, T4TOTAL, FREET4, T3FREE, THYROIDAB in the last 72 hours. Anemia Panel: No results for input(s): VITAMINB12, FOLATE, FERRITIN, TIBC, IRON, RETICCTPCT in the last 72 hours. Urine analysis:    Component Value Date/Time   COLORURINE YELLOW 11/27/2020 0612   APPEARANCEUR CLEAR 11/27/2020 0612   LABSPEC >1.046 (H) 11/27/2020 0612   PHURINE 5.0 11/27/2020 0612   GLUCOSEU NEGATIVE 11/27/2020 0612   HGBUR NEGATIVE 11/27/2020 0612   BILIRUBINUR NEGATIVE 11/27/2020 0612   KETONESUR 5 (A) 11/27/2020 0612   PROTEINUR NEGATIVE 11/27/2020 0612   NITRITE NEGATIVE 11/27/2020 0612   LEUKOCYTESUR NEGATIVE 11/27/2020 0612   Recent  Results (from the past 240 hour(s))  SARS CORONAVIRUS 2 (TAT 6-24 HRS) Nasopharyngeal Nasopharyngeal Swab     Status: None   Collection Time: 11/27/20  3:32 AM   Specimen: Nasopharyngeal Swab  Result Value Ref Range Status   SARS Coronavirus 2 NEGATIVE NEGATIVE Final    Comment: (NOTE) SARS-CoV-2 target nucleic acids are NOT DETECTED.  The SARS-CoV-2 RNA is generally detectable in upper and lower respiratory specimens during the acute phase of infection. Negative results do not preclude SARS-CoV-2 infection, do not rule out co-infections with other pathogens, and should not be used as the sole basis for treatment or other patient management decisions. Negative results must be combined with clinical observations, patient history, and epidemiological information. The expected result is Negative.  Fact Sheet for Patients: SugarRoll.be  Fact Sheet for Healthcare Providers: https://www.woods-mathews.com/  This test is not yet approved or cleared by the [place] FDA and  has been authorized for detection and/or diagnosis of  SARS-CoV-2 by FDA under an Emergency Use Authorization (EUA). This EUA will remain  in effect (meaning this test can be used) for the duration of the COVID-19 declaration under Se ction 564(b)(1) of the Act, 21 U.S.C. section 360bbb-3(b)(1), unless the authorization is terminated or revoked sooner.  Performed at [facility], [place] [address], [place], [place] [address]       Radiology Studies: CT ABDOMEN PELVIS W CONTRAST  Result Date: 11/28/2020 CLINICAL DATA:  Bowel obstruction suspected EXAM: CT ABDOMEN AND PELVIS WITH CONTRAST TECHNIQUE: Multidetector CT imaging of the abdomen and pelvis was performed using the standard protocol following bolus administration of intravenous contrast. CONTRAST:  153mL OMNIPAQUE IOHEXOL 300 MG/ML SOLN, additional oral enteric contrast COMPARISON:  Abdominal radiographs,  11/27/2020, CT chest abdomen pelvis angiogram, 11/27/2020 CT abdomen pelvis, 01/17/2019 FINDINGS: Lower chest: Atelectasis or consolidation of the dependent bilateral lung bases, new compared to prior examination. Hepatobiliary: No solid liver abnormality is seen. No gallstones, gallbladder wall thickening, or biliary dilatation. Pancreas: Unremarkable. No pancreatic ductal dilatation or surrounding inflammatory changes. Spleen: Normal in size without significant abnormality. Adrenals/Urinary Tract: Adrenal glands are unremarkable. Kidneys are normal, without renal calculi, solid lesion, or hydronephrosis. Bladder is unremarkable. Stomach/Bowel: Esophagogastric tube with tip and side port below the diaphragm. Stomach is within normal limits. Appendix is surgically absent. The small bowel is generally decompressed, with full some fluid-filled, nondistended loops throughout. There is transit of oral enteric contrast to the terminal ileum. There is masslike, circumferential thickening of the terminal ileum and cecal base near the ileocecal valve (series 8, image 82, series 5, image 57). Sigmoid diverticulosis. Vascular/Lymphatic: Aortic atherosclerosis. There are abnormally enlarged lymph nodes in the right lower quadrant mesentery adjacent to the terminal ileum measuring up to 2.3 x 1.4 cm (series 5, image 52). Reproductive: No mass or other significant abnormality. Other: Small, fat containing left inguinal hernia. There is small volume perihepatic and perisplenic ascites. There is extensive omental and peritoneal nodularity and caking (series 5, image 38, 72). Musculoskeletal: No acute or significant osseous findings. IMPRESSION: 1. There is masslike, circumferential thickening of the terminal ileum and cecal base near the ileocecal valve and abnormally enlarged lymph nodes in the right lower quadrant mesentery adjacent to the terminal ileum measuring up to 2.3 x 1.4 cm. 2. There is extensive omental and peritoneal  nodularity and caking throughout the abdomen. 3. Findings are highly concerning for primary colon malignancy with nodal and peritoneal metastatic disease. 4. Small volume perihepatic and perisplenic ascites. 5. The small bowel is generally decompressed, with full some fluid-filled, nondistended loops throughout. There is transit of oral enteric contrast to the terminal ileum. No evidence of overt bowel obstruction at this time. Esophagogastric tube is position with tip and side port below the diaphragm. 6. Atelectasis or consolidation of the dependent bilateral lung bases, new compared to prior examination. Aortic Atherosclerosis (ICD10-I70.0). Electronically Signed   By: Eddie Candle M.D.   On: 11/28/2020 11:08    Scheduled Meds: . [MAR Hold] Chlorhexidine Gluconate Cloth  6 each Topical Once  . [MAR Hold] enoxaparin (LOVENOX) injection  40 mg Subcutaneous Daily   Continuous Infusions: . [MAR Hold] cefoTEtan (CEFOTAN) IV    . lactated ringers 125 mL/hr at 11/29/20 0444  . lactated ringers 10 mL/hr at 11/29/20 0947  . [MAR Hold] piperacillin-tazobactam (ZOSYN)  IV 3.375 g (11/29/20 0636)     LOS: 2 days   Time spent: 25 minutes.  Patrecia Pour, MD [facility] [email or web address] 11/29/2020,  11:12 AM

## 2020-11-30 LAB — CBC
HCT: 42.7 % (ref 39.0–52.0)
Hemoglobin: 14.3 g/dL (ref 13.0–17.0)
MCH: 29.9 pg (ref 26.0–34.0)
MCHC: 33.5 g/dL (ref 30.0–36.0)
MCV: 89.1 fL (ref 80.0–100.0)
Platelets: 301 10*3/uL (ref 150–400)
RBC: 4.79 MIL/uL (ref 4.22–5.81)
RDW: 12.5 % (ref 11.5–15.5)
WBC: 11.2 10*3/uL — ABNORMAL HIGH (ref 4.0–10.5)
nRBC: 0 % (ref 0.0–0.2)

## 2020-11-30 LAB — CEA: CEA: 1.9 ng/mL (ref 0.0–4.7)

## 2020-11-30 LAB — PSA: Prostatic Specific Antigen: 0.86 ng/mL (ref 0.00–4.00)

## 2020-11-30 MED ORDER — HYDROMORPHONE HCL 1 MG/ML IJ SOLN
0.5000 mg | INTRAMUSCULAR | Status: DC | PRN
  Administered 2020-11-30 – 2020-12-02 (×11): 1 mg via INTRAVENOUS
  Filled 2020-11-30 (×11): qty 1

## 2020-11-30 MED ORDER — METHOCARBAMOL 1000 MG/10ML IJ SOLN
500.0000 mg | Freq: Three times a day (TID) | INTRAVENOUS | Status: DC | PRN
  Filled 2020-11-30: qty 5

## 2020-11-30 MED ORDER — MELATONIN 5 MG PO TABS
5.0000 mg | ORAL_TABLET | Freq: Once | ORAL | Status: AC
  Administered 2020-11-30: 5 mg via ORAL
  Filled 2020-11-30: qty 1

## 2020-11-30 NOTE — Progress Notes (Signed)
   Trauma/Critical Care Follow Up Note  Subjective:    Overnight Issues:   Objective:  Vital signs for last 24 hours: Temp:  [98.4 F (36.9 C)-100.3 F (37.9 C)] 98.4 F (36.9 C) (03/19 0342) Pulse Rate:  [67-87] 67 (03/19 0342) Resp:  [16-18] 16 (03/19 0342) BP: (111-137)/(76-81) 111/76 (03/19 0342) SpO2:  [95 %-96 %] 95 % (03/19 0342)  Hemodynamic parameters for last 24 hours:    Intake/Output from previous day: 03/18 0701 - 03/19 0700 In: 6434.9 [P.O.:100; I.V.:6005.9; IV Piggyback:329] Out: 350 [Urine:150; Blood:200]  Intake/Output this shift: Total I/O In: 1194.9 [I.V.:1064; IV Piggyback:130.9] Out: 850 [Urine:400; Drains:450]  Vent settings for last 24 hours:    Physical Exam:  Gen: comfortable, no distress Neuro: non-focal exam HEENT: PERRL Neck: supple CV: RRR Pulm: unlabored breathing Abd: soft, NT, incision with minimal drainage, g-tube to gravity GU: clear yellow urine Extr: wwp, no edema   Results for orders placed or performed during the hospital encounter of 11/27/20 (from the past 24 hour(s))  PSA     Status: None   Collection Time: 11/30/20 12:26 AM  Result Value Ref Range   Prostatic Specific Antigen 0.86 0.00 - 4.00 ng/mL    Assessment & Plan:  Present on Admission: **None**    LOS: 3 days   Additional comments:I reviewed the patient's new clinical lab test results.   and I reviewed the patients new imaging test results.    HLD Obesity BMI 34.17   SBO vs ileitis vs malignancy - s/p exlap, omental and peritoneal biopsy and g-tube placement 3/18. Intra-operative confirmation of metastatic disease involving SB mesentery, colon epiploicae, omentum, pelvis, peritoneum - NPO until ROBF   ID - zosyn 3/16>> FEN - IVF, NPO, NGT clamped VTE - SCDs, lovenox Foley - none Follow up - TBD  Dispo - floor  Jesusita Oka, MD Trauma & General Surgery Please use [email or web address] to contact on call provider  11/30/2020  *Care during the  described time interval was provided by me. I have reviewed this patient's available data, including medical history, events of note, physical examination and test results as part of my evaluation.

## 2020-11-30 NOTE — Progress Notes (Signed)
PROGRESS NOTE  Ricardo [name]  NLG:[id number] DOB: May 24, 1966 DOA: 11/27/2020 PCP: [name], MD  Outpatient Specialists: [facility] GI Brief Narrative: Ricardo [name] is a 55 y.o. male with a history of HLD, laparoscopic appendectomy May 2020, and several weeks of eructation, intermittent central abdominal tenderness and cramping who presented to the ED 3/16 with worsening, severe pain reported to radiate to the back associated with nausea and vomiting and decreased stool output. WBC 12.8k, CTA chest/abd/pelvis showed no dissection but revealed terminal ileum thickening with suggested small bowel obstruction. CT abd/pelvis with PO and IV contrast is delayed by recent dye load until 3/17 AM per surgery recommendations. NGT was inserted and symptoms have improved. Repeat CT with contrast reveals circumferential thickening of the ileum with regional lymphadenopathy and peritoneal nodularity and caking suggestive of new diagnosis of malignancy. Dr. [name] performed ex lap with omental and peritoneal biopsy with g-tube placement on 3/18. Oncology has been consulted and we are awaiting pathology results. No return of bowel function yet.   Assessment & Plan: Principal Problem:   SBO (small bowel obstruction) (HCC) Active Problems:   Ileitis   Intractable abdominal pain   Nausea and vomiting   Hyperlipidemia   Lymphadenopathy, abdominal  Metastatic cancer of unknown primary source: Mass of ileum with regional lymphadenopathy and omental and peritoneal nodularity/caking consistent with metastatic cancer. Appendix or gallbladder could be sources. Note the patient have acute appendicitis May 2020 s/p appendectomy.  - Postoperative recommendations per surgery. Will change morphine back to dilaudid as this was more effective, though discussed hope to limit opioids in this setting. Pt feels slightly sedated, will make robaxin prn. Continue IVF while remaining NPO. - Oncology consulted and following.  Pathology results pending.  Partial SBO: Due to mass above. Seems to have resolved. Now awaiting ROBF postoperatively.  - Start diet once bowel function returned.   Obesity: Estimated body mass index is 34.17 kg/m as calculated from the following:   Height as of this encounter: 5\' 11"  (1.803 m).   Weight as of this encounter: 111.1 kg.  DVT prophylaxis: Lovenox Code Status: Full Family Communication: Wife at bedside  Disposition Plan:  Status is: Inpatient  Remains inpatient appropriate because:Ongoing diagnostic testing needed not appropriate for outpatient work up  Dispo: The patient is from: Home              Anticipated d/c is to: Home              Patient currently is not medically stable to d/c.  Consultants:   General surgery  Oncology  Procedures:   NG tube insertion  Exploratory laparotomy 3/18  Antimicrobials:  Zosyn   Subjective: No BM since surgery. Eager to move toward treatment and get out of the hospital. Abdominal tenderness is mod-severe, constant, worse with movement, mostly around incision site.   Objective: Vitals:   11/29/20 2041 11/29/20 2358 11/30/20 0342 11/30/20 1247  BP: 126/78 135/79 111/76 129/74  Pulse: 85 75 67 70  Resp: 16 18 16 18   Temp: 100.3 F (37.9 C) 98.5 F (36.9 C) 98.4 F (36.9 C) 98.5 F (36.9 C)  TempSrc: Oral Oral Oral Oral  SpO2: 96%  95% 95%  Weight:      Height:        Intake/Output Summary (Last 24 hours) at 11/30/2020 1413 Last data filed at 11/30/2020 1152 Gross per 24 hour  Intake 6829.81 ml  Output 1000 ml  Net 5829.81 ml   [employer]  11/27/20 0018 11/29/20 0923  Weight: 111.1 kg 111.1 kg   Gen: 55 y.o. male in no distress Pulm: Nonlabored breathing room air. Clear. CV: Regular rate and rhythm. No murmur, rub, or gallop. No JVD, no dependent edema. GI: Abdomen soft, appropriately tender without distention, hypoactive BS  Ext: Warm, no deformities. PIV x3.  Skin: Vertical midline abdominal  incision with honeycomb dressing over stapled well apposed edges, some dried blood at inferior margin but no active bleeding or discharge. Neuro: Alert and oriented. No focal neurological deficits. Psych: Judgement and insight appear fair. Mood euthymic & affect congruent. Behavior is appropriate.    Data Reviewed: I have personally reviewed following labs and imaging studies  CBC: Recent Labs  Lab 11/27/20 0022 11/27/20 0612 11/28/20 0158 11/29/20 0150 11/30/20 0903  WBC 12.8* 11.0* 7.3 7.8 11.2*  HGB 15.7 14.7 14.0 13.7 14.3  HCT 44.9 43.6 41.7 39.2 42.7  MCV 86.8 88.4 89.1 86.2 89.1  PLT 330 276 273 255 161   Basic Metabolic Panel: Recent Labs  Lab 11/27/20 0022 11/27/20 0612 11/28/20 0158 11/29/20 0150  NA 136  --  138 135  K 4.3  --  4.0 3.7  CL 103  --  103 102  CO2 25  --  26 26  GLUCOSE 112*  --  99 93  BUN 11  --  10 7  CREATININE 1.25* 1.16 1.26* 1.08  CALCIUM 9.5  --  8.6* 8.7*   GFR: Estimated Creatinine Clearance: 97.9 mL/min (by C-G formula based on SCr of 1.08 mg/dL). Liver Function Tests: Recent Labs  Lab 11/27/20 0022  AST 22  ALT 21  ALKPHOS 85  BILITOT 0.9  PROT 8.0  ALBUMIN 4.3   Recent Labs  Lab 11/27/20 0022  LIPASE 34   No results for input(s): AMMONIA in the last 168 hours. Coagulation Profile: No results for input(s): INR, PROTIME in the last 168 hours. Cardiac Enzymes: No results for input(s): CKTOTAL, CKMB, CKMBINDEX, TROPONINI in the last 168 hours. BNP (last 3 results) No results for input(s): PROBNP in the last 8760 hours. HbA1C: No results for input(s): HGBA1C in the last 72 hours. CBG: No results for input(s): GLUCAP in the last 168 hours. Lipid Profile: No results for input(s): CHOL, HDL, LDLCALC, TRIG, CHOLHDL, LDLDIRECT in the last 72 hours. Thyroid Function Tests: No results for input(s): TSH, T4TOTAL, FREET4, T3FREE, THYROIDAB in the last 72 hours. Anemia Panel: No results for input(s): VITAMINB12, FOLATE,  FERRITIN, TIBC, IRON, RETICCTPCT in the last 72 hours. Urine analysis:    Component Value Date/Time   COLORURINE YELLOW 11/27/2020 0612   APPEARANCEUR CLEAR 11/27/2020 0612   LABSPEC >1.046 (H) 11/27/2020 0612   PHURINE 5.0 11/27/2020 0612   GLUCOSEU NEGATIVE 11/27/2020 0612   HGBUR NEGATIVE 11/27/2020 0612   BILIRUBINUR NEGATIVE 11/27/2020 0612   KETONESUR 5 (A) 11/27/2020 0612   PROTEINUR NEGATIVE 11/27/2020 0612   NITRITE NEGATIVE 11/27/2020 0612   LEUKOCYTESUR NEGATIVE 11/27/2020 0612   Recent Results (from the past 240 hour(s))  SARS CORONAVIRUS 2 (TAT 6-24 HRS) Nasopharyngeal Nasopharyngeal Swab     Status: None   Collection Time: 11/27/20  3:32 AM   Specimen: Nasopharyngeal Swab  Result Value Ref Range Status   SARS Coronavirus 2 NEGATIVE NEGATIVE Final    Comment: (NOTE) SARS-CoV-2 target nucleic acids are NOT DETECTED.  The SARS-CoV-2 RNA is generally detectable in upper and lower respiratory specimens during the acute phase of infection. Negative results do not preclude SARS-CoV-2 infection, do not  rule out co-infections with other pathogens, and should not be used as the sole basis for treatment or other patient management decisions. Negative results must be combined with clinical observations, patient history, and epidemiological information. The expected result is Negative.  Fact Sheet for Patients: SugarRoll.be  Fact Sheet for Healthcare Providers: https://www.woods-mathews.com/  This test is not yet approved or cleared by the [place] FDA and  has been authorized for detection and/or diagnosis of SARS-CoV-2 by FDA under an Emergency Use Authorization (EUA). This EUA will remain  in effect (meaning this test can be used) for the duration of the COVID-19 declaration under Se ction 564(b)(1) of the Act, 21 U.S.C. section 360bbb-3(b)(1), unless the authorization is terminated or revoked sooner.  Performed at [facility], [place] [address] [place], [place] [address]       Radiology Studies: No results found.  Scheduled Meds: . Chlorhexidine Gluconate Cloth  6 each Topical Once  . enoxaparin (LOVENOX) injection  40 mg Subcutaneous Daily  . ketorolac  15 mg Intravenous Q8H   Continuous Infusions: . lactated ringers 125 mL/hr at 11/30/20 0726  . lactated ringers 800 mL/hr at 11/29/20 1129  . methocarbamol (ROBAXIN) IV       LOS: 3 days   Time spent: 25 minutes.  Patrecia Pour, MD [facility] [email or web address] 11/30/2020, 2:13 PM

## 2020-11-30 NOTE — Progress Notes (Signed)
Pt reported three small BM over last two hours. Pt requesting juice. Reported ROBF to [facility] Dr. Josephine Cables via [facility] page. Awaiting call back.

## 2020-11-30 NOTE — Plan of Care (Signed)
  Problem: Education: Goal: Knowledge of General Education information will improve Description: Including pain rating scale, medication(s)/side effects and non-pharmacologic comfort measures Outcome: Progressing   Problem: Pain Managment: Goal: General experience of comfort will improve Outcome: Progressing   Problem: Safety: Goal: Ability to remain free from injury will improve Outcome: Progressing   

## 2020-12-01 DIAGNOSIS — K56609 Unspecified intestinal obstruction, unspecified as to partial versus complete obstruction: Secondary | ICD-10-CM | POA: Diagnosis not present

## 2020-12-01 LAB — CBC
HCT: 36.3 % — ABNORMAL LOW (ref 39.0–52.0)
Hemoglobin: 12.6 g/dL — ABNORMAL LOW (ref 13.0–17.0)
MCH: 29.9 pg (ref 26.0–34.0)
MCHC: 34.7 g/dL (ref 30.0–36.0)
MCV: 86 fL (ref 80.0–100.0)
Platelets: 256 10*3/uL (ref 150–400)
RBC: 4.22 MIL/uL (ref 4.22–5.81)
RDW: 12.3 % (ref 11.5–15.5)
WBC: 7.5 10*3/uL (ref 4.0–10.5)
nRBC: 0 % (ref 0.0–0.2)

## 2020-12-01 LAB — BASIC METABOLIC PANEL
Anion gap: 10 (ref 5–15)
BUN: 11 mg/dL (ref 6–20)
CO2: 24 mmol/L (ref 22–32)
Calcium: 8.1 mg/dL — ABNORMAL LOW (ref 8.9–10.3)
Chloride: 104 mmol/L (ref 98–111)
Creatinine, Ser: 1.13 mg/dL (ref 0.61–1.24)
GFR, Estimated: >60 mL/min (ref >60)
Glucose, Bld: 83 mg/dL (ref 70–99)
Potassium: 3.6 mmol/L (ref 3.5–5.1)
Sodium: 138 mmol/L (ref 135–145)

## 2020-12-01 LAB — CANCER ANTIGEN 19-9: CA 19-9: 4 U/mL (ref 0–35)

## 2020-12-01 NOTE — Plan of Care (Signed)

## 2020-12-01 NOTE — Progress Notes (Signed)
   General Surgery Follow Up Note  Subjective:    Overnight Issues:   Objective:  Vital signs for last 24 hours: Temp:  [97.6 F (36.4 C)-98.8 F (37.1 C)] 98.3 F (36.8 C) (03/20 0608) Pulse Rate:  [64-72] 64 (03/20 0608) Resp:  [16-18] 16 (03/19 2330) BP: (125-136)/(72-85) 136/85 (03/20 0608) SpO2:  [91 %-95 %] 94 % (03/20 0608)  Hemodynamic parameters for last 24 hours:    Intake/Output from previous day: 03/19 0701 - 03/20 0700 In: 2362.3 [I.V.:2231.5; IV Piggyback:130.9] Out: 3450 [Urine:600; Drains:2850]  Intake/Output this shift: No intake/output data recorded.  Vent settings for last 24 hours:    Physical Exam:  Gen: comfortable, no distress Neuro: non-focal exam HEENT: PERRL Neck: supple CV: RRR Pulm: unlabored breathing on RA Abd: soft, NT, incision c/d/i with staples GU: clear yellow urine, spont voids Extr: wwp, no edema   Results for orders placed or performed during the hospital encounter of 11/27/20 (from the past 24 hour(s))  CBC     Status: Abnormal   Collection Time: 12/01/20 12:33 AM  Result Value Ref Range   WBC 7.5 4.0 - 10.5 K/uL   RBC 4.22 4.22 - 5.81 MIL/uL   Hemoglobin 12.6 (L) 13.0 - 17.0 g/dL   HCT 36.3 (L) 39.0 - 52.0 %   MCV 86.0 80.0 - 100.0 fL   MCH 29.9 26.0 - 34.0 pg   MCHC 34.7 30.0 - 36.0 g/dL   RDW 12.3 11.5 - 15.5 %   Platelets 256 150 - 400 K/uL   nRBC 0.0 0.0 - 0.2 %  Basic metabolic panel     Status: Abnormal   Collection Time: 12/01/20 12:33 AM  Result Value Ref Range   Sodium 138 135 - 145 mmol/L   Potassium 3.6 3.5 - 5.1 mmol/L   Chloride 104 98 - 111 mmol/L   CO2 24 22 - 32 mmol/L   Glucose, Bld 83 70 - 99 mg/dL   BUN 11 6 - 20 mg/dL   Creatinine, Ser 1.13 0.61 - 1.24 mg/dL   Calcium 8.1 (L) 8.9 - 10.3 mg/dL   GFR, Estimated >60 >60 mL/min   Anion gap 10 5 - 15    Assessment & Plan:  Present on Admission: **None**    LOS: 4 days   Additional comments:I reviewed the patient's new clinical lab  test results.   and I reviewed the patients new imaging test results.    SBO vs ileitis vs malignancy -s/p exlap, omental and peritoneal biopsy and g-tube placement 3/18. Intra-operative confirmation of metastatic disease involving SB mesentery, colon epiploicae, omentum, pelvis, peritoneum - tol CLD last PM, adv to FLD today   ID -zosyn 3/16>> FEN - decreaseIVF to 75/h, adv to FLD VTE -SCDs, lovenox Foley - none Follow up - TBD  Dispo - floor  Jesusita Oka, MD Trauma & General Surgery Please use [email or web address] to contact on call provider  12/01/2020  *Care during the described time interval was provided by me. I have reviewed this patient's available data, including medical history, events of note, physical examination and test results as part of my evaluation.

## 2020-12-01 NOTE — Progress Notes (Addendum)
PROGRESS NOTE  Ricardo [name]  QIH:[id number] DOB: 04/03/1966 DOA: 11/27/2020 PCP: [name], MD  Outpatient Specialists: [facility] GI Brief Narrative: Ricardo [name] is a 55 y.o. male with a history of HLD, laparoscopic appendectomy May 2020, and several weeks of eructation, intermittent central abdominal tenderness and cramping who presented to the ED 3/16 with worsening, severe pain reported to radiate to the back associated with nausea and vomiting and decreased stool output. WBC 12.8k, CTA chest/abd/pelvis showed no dissection but revealed terminal ileum thickening with suggested small bowel obstruction. CT abd/pelvis with PO and IV contrast is delayed by recent dye load until 3/17 AM per surgery recommendations. NGT was inserted and symptoms have improved. Repeat CT with contrast reveals circumferential thickening of the ileum with regional lymphadenopathy and peritoneal nodularity and caking suggestive of new diagnosis of malignancy. Dr. [name] performed ex lap with omental and peritoneal biopsy with g-tube placement on 3/18. Oncology has been consulted and we are awaiting pathology results.   Assessment & Plan: Principal Problem:   SBO (small bowel obstruction) (HCC) Active Problems:   Ileitis   Intractable abdominal pain   Nausea and vomiting   Hyperlipidemia   Lymphadenopathy, abdominal  Metastatic cancer of unknown primary source: Mass of ileum with regional lymphadenopathy and omental and peritoneal nodularity/caking consistent with metastatic cancer. Appendix or gallbladder could be sources. Note the patient had acute appendicitis May 2020 s/p appendectomy.  - Postoperative recommendations per surgery.  - Continue pain medications as ordered, minimize narcotics.  - Advance diet per surgery. Once tolerating solids, can DC IVF.  - Oncology consulted and following. Pathology results pending. Will follow up with either Dr. Benay Spice or Dr. Burr Medico (GI onc).   Partial SBO: Due to  mass above. Seems to have resolved. Now awaiting full ROBF postoperatively. s/p gastrostomy. - Start diet once bowel function returned.   Acute postoperative blood loss anemia: Mild, asymptomatic.  - Monitor intermittently or prn bleeding.  Obesity: Estimated body mass index is 34.17 kg/m as calculated from the following:   Height as of this encounter: 5\' 11"  (1.803 m).   Weight as of this encounter: 111.1 kg.  DVT prophylaxis: Lovenox Code Status: Full Family Communication: Wife at bedside  Disposition Plan:  Status is: Inpatient  Remains inpatient appropriate because:Ongoing diagnostic testing needed not appropriate for outpatient work up  Dispo: The patient is from: Home              Anticipated d/c is to: Home              Patient currently is not medically stable to d/c.  Consultants:   General surgery  Oncology  Procedures:   NG tube insertion  Exploratory laparotomy 3/18  Antimicrobials:  Zosyn   Subjective: Moving, ambulating, abdominal pain controlled with less pain medication. No vomiting or nausea reported.   Objective: Vitals:   11/30/20 1826 11/30/20 2330 12/01/20 0608 12/01/20 1157  BP: 125/72 126/77 136/85 123/69  Pulse: 72 68 64 78  Resp: 16 16  20   Temp: 97.6 F (36.4 C) 98.8 F (37.1 C) 98.3 F (36.8 C) 98.6 F (37 C)  TempSrc: Oral Oral Oral Oral  SpO2: 91% 93% 94% 93%  Weight:      Height:        Intake/Output Summary (Last 24 hours) at 12/01/2020 1405 Last data filed at 12/01/2020 1300 Gross per 24 hour  Intake 1407.43 ml  Output 3150 ml  Net -1742.57 ml   [employer]  11/27/20 0018 11/29/20 0923  Weight: 111.1 kg 111.1 kg   Gen: 55 y.o. male in no distress Pulm: Nonlabored breathing room air. Clear. CV: Regular rate and rhythm. No murmur, rub, or gallop. No JVD, no dependent edema. GI: Abdomen soft, appropriately tender without distention, normoactive bowel sounds.  Ext: Warm, no deformities Skin: No new rashes, lesions  or ulcers on visualized skin. Midline abdominal incision and G tube site c/d/i. Neuro: Alert and oriented. No focal neurological deficits. Psych: Judgement and insight appear fair. Mood euthymic & affect congruent. Behavior is appropriate.    Data Reviewed: I have personally reviewed following labs and imaging studies  CBC: Recent Labs  Lab 11/27/20 0612 11/28/20 0158 11/29/20 0150 11/30/20 0903 12/01/20 0033  WBC 11.0* 7.3 7.8 11.2* 7.5  HGB 14.7 14.0 13.7 14.3 12.6*  HCT 43.6 41.7 39.2 42.7 36.3*  MCV 88.4 89.1 86.2 89.1 86.0  PLT 276 273 255 301 703   Basic Metabolic Panel: Recent Labs  Lab 11/27/20 0022 11/27/20 0612 11/28/20 0158 11/29/20 0150 12/01/20 0033  NA 136  --  138 135 138  K 4.3  --  4.0 3.7 3.6  CL 103  --  103 102 104  CO2 25  --  26 26 24   GLUCOSE 112*  --  99 93 83  BUN 11  --  10 7 11   CREATININE 1.25* 1.16 1.26* 1.08 1.13  CALCIUM 9.5  --  8.6* 8.7* 8.1*   GFR: Estimated Creatinine Clearance: 93.6 mL/min (by C-G formula based on SCr of 1.13 mg/dL). Liver Function Tests: Recent Labs  Lab 11/27/20 0022  AST 22  ALT 21  ALKPHOS 85  BILITOT 0.9  PROT 8.0  ALBUMIN 4.3   Recent Labs  Lab 11/27/20 0022  LIPASE 34   No results for input(s): AMMONIA in the last 168 hours. Coagulation Profile: No results for input(s): INR, PROTIME in the last 168 hours. Cardiac Enzymes: No results for input(s): CKTOTAL, CKMB, CKMBINDEX, TROPONINI in the last 168 hours. BNP (last 3 results) No results for input(s): PROBNP in the last 8760 hours. HbA1C: No results for input(s): HGBA1C in the last 72 hours. CBG: No results for input(s): GLUCAP in the last 168 hours. Lipid Profile: No results for input(s): CHOL, HDL, LDLCALC, TRIG, CHOLHDL, LDLDIRECT in the last 72 hours. Thyroid Function Tests: No results for input(s): TSH, T4TOTAL, FREET4, T3FREE, THYROIDAB in the last 72 hours. Anemia Panel: No results for input(s): VITAMINB12, FOLATE, FERRITIN, TIBC,  IRON, RETICCTPCT in the last 72 hours. Urine analysis:    Component Value Date/Time   COLORURINE YELLOW 11/27/2020 0612   APPEARANCEUR CLEAR 11/27/2020 0612   LABSPEC >1.046 (H) 11/27/2020 0612   PHURINE 5.0 11/27/2020 0612   GLUCOSEU NEGATIVE 11/27/2020 0612   HGBUR NEGATIVE 11/27/2020 0612   BILIRUBINUR NEGATIVE 11/27/2020 0612   KETONESUR 5 (A) 11/27/2020 0612   PROTEINUR NEGATIVE 11/27/2020 0612   NITRITE NEGATIVE 11/27/2020 0612   LEUKOCYTESUR NEGATIVE 11/27/2020 0612   Recent Results (from the past 240 hour(s))  SARS CORONAVIRUS 2 (TAT 6-24 HRS) Nasopharyngeal Nasopharyngeal Swab     Status: None   Collection Time: 11/27/20  3:32 AM   Specimen: Nasopharyngeal Swab  Result Value Ref Range Status   SARS Coronavirus 2 NEGATIVE NEGATIVE Final    Comment: (NOTE) SARS-CoV-2 target nucleic acids are NOT DETECTED.  The SARS-CoV-2 RNA is generally detectable in upper and lower respiratory specimens during the acute phase of infection. Negative results do not preclude SARS-CoV-2 infection, do  not rule out co-infections with other pathogens, and should not be used as the sole basis for treatment or other patient management decisions. Negative results must be combined with clinical observations, patient history, and epidemiological information. The expected result is Negative.  Fact Sheet for Patients: SugarRoll.be  Fact Sheet for Healthcare Providers: https://www.woods-mathews.com/  This test is not yet approved or cleared by the [place] FDA and  has been authorized for detection and/or diagnosis of SARS-CoV-2 by FDA under an Emergency Use Authorization (EUA). This EUA will remain  in effect (meaning this test can be used) for the duration of the COVID-19 declaration under Se ction 564(b)(1) of the Act, 21 U.S.C. section 360bbb-3(b)(1), unless the authorization is terminated or revoked sooner.  Performed at [facility], [place] [address] [place], [place] [address]       Radiology Studies: No results found.  Scheduled Meds: . Chlorhexidine Gluconate Cloth  6 each Topical Once  . enoxaparin (LOVENOX) injection  40 mg Subcutaneous Daily  . ketorolac  15 mg Intravenous Q8H   Continuous Infusions: . lactated ringers 75 mL/hr at 12/01/20 0956  . lactated ringers 800 mL/hr at 11/29/20 1129  . methocarbamol (ROBAXIN) IV       LOS: 4 days   Time spent: 25 minutes.  Patrecia Pour, MD [facility] [email or web address] 12/01/2020, 2:05 PM

## 2020-12-01 NOTE — Progress Notes (Signed)
Ricardo [name]   DOB:1965/10/27   JK#:[id number]   [id number]#:[id number]  Subjective:  Lying in bed but tells me he already took a walk this AM and took 3-4 yesterday; passing a little bit of gas; pain moderately well controlled on dilaudid. Wife in room   Objective: white man examined in bed Vitals:   11/30/20 2330 12/01/20 0608  BP: 126/77 136/85  Pulse: 68 64  Resp: 16   Temp: 98.8 F (37.1 C) 98.3 F (36.8 C)  SpO2: 93% 94%    Body mass index is 34.17 kg/m.  Intake/Output Summary (Last 24 hours) at 12/01/2020 0825 Last data filed at 12/01/2020 0700 Gross per 24 hour  Intake 1167.43 ml  Output 3000 ml  Net -1832.57 ml       CBG (last 3)  No results for input(s): GLUCAP in the last 72 hours.   Labs:  Lab Results  Component Value Date   WBC 7.5 12/01/2020   HGB 12.6 (L) 12/01/2020   HCT 36.3 (L) 12/01/2020   MCV 86.0 12/01/2020   PLT 256 12/01/2020   NEUTROABS 4.6 09/04/2020    @LASTCHEMISTRY @  Urine Studies No results for input(s): UHGB, CRYS in the last 72 hours.  Invalid input(s): UACOL, UAPR, USPG, UPH, UTP, UGL, UKET, UBIL, UNIT, UROB, ULEU, UEPI, UWBC, URBC, UBAC, CAST, [place], [place]  Basic Metabolic Panel: Recent Labs  Lab 11/27/20 0022 11/27/20 0612 11/28/20 0158 11/29/20 0150 12/01/20 0033  NA 136  --  138 135 138  K 4.3  --  4.0 3.7 3.6  CL 103  --  103 102 104  CO2 25  --  26 26 24   GLUCOSE 112*  --  99 93 83  BUN 11  --  10 7 11   CREATININE 1.25* 1.16 1.26* 1.08 1.13  CALCIUM 9.5  --  8.6* 8.7* 8.1*   GFR Estimated Creatinine Clearance: 93.6 mL/min (by C-G formula based on SCr of 1.13 mg/dL). Liver Function Tests: Recent Labs  Lab 11/27/20 0022  AST 22  ALT 21  ALKPHOS 85  BILITOT 0.9  PROT 8.0  ALBUMIN 4.3   Recent Labs  Lab 11/27/20 0022  LIPASE 34   No results for input(s): AMMONIA in the last 168 hours. Coagulation profile No results for input(s): INR, PROTIME in the last 168 hours.  CBC: Recent Labs  Lab 11/27/20 0612  11/28/20 0158 11/29/20 0150 11/30/20 0903 12/01/20 0033  WBC 11.0* 7.3 7.8 11.2* 7.5  HGB 14.7 14.0 13.7 14.3 12.6*  HCT 43.6 41.7 39.2 42.7 36.3*  MCV 88.4 89.1 86.2 89.1 86.0  PLT 276 273 255 301 256   Cardiac Enzymes: No results for input(s): CKTOTAL, CKMB, CKMBINDEX, TROPONINI in the last 168 hours. BNP: Invalid input(s): POCBNP CBG: No results for input(s): GLUCAP in the last 168 hours. D-Dimer No results for input(s): DDIMER in the last 72 hours. Hgb A1c No results for input(s): HGBA1C in the last 72 hours. Lipid Profile No results for input(s): CHOL, HDL, LDLCALC, TRIG, CHOLHDL, LDLDIRECT in the last 72 hours. Thyroid function studies No results for input(s): TSH, T4TOTAL, T3FREE, THYROIDAB in the last 72 hours.  Invalid input(s): FREET3 Anemia work up No results for input(s): VITAMINB12, FOLATE, FERRITIN, TIBC, IRON, RETICCTPCT in the last 72 hours. Microbiology Recent Results (from the past 240 hour(s))  SARS CORONAVIRUS 2 (TAT 6-24 HRS) Nasopharyngeal Nasopharyngeal Swab     Status: None   Collection Time: 11/27/20  3:32 AM   Specimen: Nasopharyngeal Swab  Result Value Ref  Range Status   SARS Coronavirus 2 NEGATIVE NEGATIVE Final    Comment: (NOTE) SARS-CoV-2 target nucleic acids are NOT DETECTED.  The SARS-CoV-2 RNA is generally detectable in upper and lower respiratory specimens during the acute phase of infection. Negative results do not preclude SARS-CoV-2 infection, do not rule out co-infections with other pathogens, and should not be used as the sole basis for treatment or other patient management decisions. Negative results must be combined with clinical observations, patient history, and epidemiological information. The expected result is Negative.  Fact Sheet for Patients: SugarRoll.be  Fact Sheet for Healthcare Providers: https://www.woods-mathews.com/  This test is not yet approved or cleared by the  [place] FDA and  has been authorized for detection and/or diagnosis of SARS-CoV-2 by FDA under an Emergency Use Authorization (EUA). This EUA will remain  in effect (meaning this test can be used) for the duration of the COVID-19 declaration under Se ction 564(b)(1) of the Act, 21 U.S.C. section 360bbb-3(b)(1), unless the authorization is terminated or revoked sooner.  Performed at [facility], [place] [address] [place], [place] [address]       Studies:  No results found.  Assessment: 55 y.o. [place], [place] male with a mass at the terminal ileum with omental and peritoneal nodularity and caking concerning for malignancy.  (a) Ct angio chest 11/27/2020 shows thickened terminal ileum with indeterminate RLQ lymphadenopathy, subcentimeter hepatic hypodensity  (b) CT abd/pelvis 11/28/2020 shows masslike thickening of the terminal ileum and enlarged RLQ nodes, largest 2.3 cm, as well as omental/peritoneal caking  (c) exp lap 11/29/2020 shows Metastatic disease involving all mesentery of small intestine, epiploic appendages of colon, omentum, pelvis, peritoneal surfaces  (d) CEA, CA19 and PSA all WNL March 2022  (e) final pathology pending  Plan:  Discussed workup so far and Ricardo [name] is coming to grips with his situation. He understands until we have the path we will not be able to give him a definitive diagnosis and plan. We discussed the tumor markers which are simply non-informative.  Encouraged him to continue fart-walks and try to minimize narcotics which will slow bowel motility.   I will follow up on the path and direct him to the appropriate outpatient oncologist (likely our GI specialists Drs Benay Spice and Burr Medico). No problems with d/c whenever you think he is ready.   Ricardo Cruel, MD 12/01/2020  8:25 AM Medical Oncology and Hematology [facility] [address] [place], [place] [address] Tel. [phone]    Fax. [phone]

## 2020-12-02 ENCOUNTER — Encounter (HOSPITAL_COMMUNITY): Payer: Self-pay | Admitting: General Surgery

## 2020-12-02 MED ORDER — HYDROMORPHONE HCL 1 MG/ML IJ SOLN
1.0000 mg | INTRAMUSCULAR | Status: DC | PRN
  Administered 2020-12-02 – 2020-12-03 (×3): 1 mg via INTRAVENOUS
  Filled 2020-12-02 (×4): qty 1

## 2020-12-02 MED ORDER — OXYCODONE HCL 5 MG PO TABS
5.0000 mg | ORAL_TABLET | ORAL | Status: DC | PRN
Start: 2020-12-02 — End: 2020-12-03
  Administered 2020-12-02 (×2): 5 mg via ORAL
  Filled 2020-12-02 (×3): qty 1

## 2020-12-02 MED ORDER — WHITE PETROLATUM EX OINT
TOPICAL_OINTMENT | CUTANEOUS | Status: DC | PRN
  Administered 2020-12-02: 0.2 via TOPICAL
  Filled 2020-12-02: qty 28.35

## 2020-12-02 NOTE — Progress Notes (Signed)
3 Days Post-Op   Subjective: Large BM and flatus this AM ROS negative except as listed above. Objective: Vital signs in last 24 hours: Temp:  [98.5 F (36.9 C)-98.8 F (37.1 C)] 98.5 F (36.9 C) (03/21 0546) Pulse Rate:  [64-78] 64 (03/21 0546) Resp:  [18-20] 18 (03/21 0546) BP: (123-149)/(69-91) 149/91 (03/21 0546) SpO2:  [93 %-97 %] 96 % (03/21 0546) Last BM Date: 11/30/20  Intake/Output from previous day: 03/20 0701 - 03/21 0700 In: 240 [P.O.:240] Out: 1150 [Urine:200; Drains:950] Intake/Output this shift: No intake/output data recorded.  General appearance: cooperative Resp: clear to auscultation bilaterally Cardio: regular rate and rhythm GI: soft, dressing with dry stain, G tube in place. +BS  Lab Results: CBC  Recent Labs    11/30/20 0903 12/01/20 0033  WBC 11.2* 7.5  HGB 14.3 12.6*  HCT 42.7 36.3*  PLT 301 256   BMET Recent Labs    12/01/20 0033  NA 138  K 3.6  CL 104  CO2 24  GLUCOSE 83  BUN 11  CREATININE 1.13  CALCIUM 8.1*   PT/INR No results for input(s): LABPROT, INR in the last 72 hours. ABG No results for input(s): PHART, HCO3 in the last 72 hours.  Invalid input(s): PCO2, PO2  Studies/Results: No results found.  Anti-infectives: Anti-infectives (From admission, onward)   Start     Dose/Rate Route Frequency Ordered Stop   11/29/20 0600  cefoTEtan (CEFOTAN) 2 g in sodium chloride 0.9 % 100 mL IVPB  Status:  Discontinued        2 g 200 mL/hr over 30 Minutes Intravenous On call to O.R. 11/28/20 1352 11/29/20 1329   11/27/20 1430  piperacillin-tazobactam (ZOSYN) IVPB 3.375 g  Status:  Discontinued        3.375 g 12.5 mL/hr over 240 Minutes Intravenous Every 8 hours 11/27/20 1414 11/29/20 1329   11/27/20 0330  cefTRIAXone (ROCEPHIN) 2 g in sodium chloride 0.9 % 100 mL IVPB       "And" Linked Group Details   2 g 200 mL/hr over 30 Minutes Intravenous  Once 11/27/20 0329 11/27/20 0433   11/27/20 0330  metroNIDAZOLE (FLAGYL) IVPB 500  mg       "And" Linked Group Details   500 mg 100 mL/hr over 60 Minutes Intravenous  Once 11/27/20 0329 11/27/20 0434      Assessment/Plan: s/p Procedure(s): EXPLORATORY LAPAROTOMY PARTIAL BOWEL RESECTION POSSIBLE OSTOMY CREATION PERITIONEAL BIOPSY INSERTION OF GASTROSTOMY TUBE  Abdominal metastatic disease -s/p exlap, omental and peritoneal biopsy and g-tube placement 3/18 by Dr. [name]. Intra-operative confirmation of metastatic disease involving SB mesentery, colon epiploicae, omentum, pelvis, peritoneum - advance to soft today   ID -off ABX FEN - SL IV, soft diet VTE -SCDs, lovenox Foley - none Follow up - TBD  Dispo - floor, transition to PO pain meds, ambulate. Home with G tube clamped. Appreciate Oncology plan, path P, F/U with Dr. Benay Spice or Dr. Burr Medico.   LOS: 5 days    Georganna Skeans, MD, MPH, FACS Trauma & General Surgery Use [email or web address] to contact on call provider  3/21/2022Patient ID: Ricardo [name], male   DOB: [birth year], 55 y.o.   MRN: [id number]

## 2020-12-02 NOTE — Progress Notes (Signed)
PROGRESS NOTE  Ricardo [name]  BWG:[id number] DOB: Feb 12, 1966 DOA: 11/27/2020 PCP: [name], MD  Outpatient Specialists: [facility] GI Brief Narrative: Ricardo [name] is a 55 y.o. male with a history of HLD, laparoscopic appendectomy May 2020, and several weeks of eructation, intermittent central abdominal tenderness and cramping who presented to the ED 3/16 with worsening, severe pain reported to radiate to the back associated with nausea and vomiting and decreased stool output. WBC 12.8k, CTA chest/abd/pelvis showed no dissection but revealed terminal ileum thickening with suggested small bowel obstruction. CT abd/pelvis with PO and IV contrast is delayed by recent dye load until 3/17 AM per surgery recommendations. NGT was inserted and symptoms have improved. Repeat CT with contrast reveals circumferential thickening of the ileum with regional lymphadenopathy and peritoneal nodularity and caking suggestive of new diagnosis of malignancy. Dr. [name] performed ex lap with omental and peritoneal biopsy with g-tube placement on 3/18. Oncology has been consulted and we are awaiting pathology results. On 3/21, diet advanced to soft in preparation for discharge 3/22.   Assessment & Plan: Principal Problem:   SBO (small bowel obstruction) (HCC) Active Problems:   Ileitis   Intractable abdominal pain   Nausea and vomiting   Hyperlipidemia   Lymphadenopathy, abdominal  Metastatic cancer of unknown primary source: Mass of ileum with regional lymphadenopathy and omental and peritoneal nodularity/caking consistent with metastatic cancer. Appendix or gallbladder could be sources. Note the patient had acute appendicitis May 2020 s/p appendectomy.  - Postoperative recommendations per surgery. ADAT to soft, change to po pain medications, DC IVF.   - Oncology consulted and following. Pathology results not available when I checked just now. Will follow up with either Dr. Benay Spice or Dr. Burr Medico (GI onc).    Partial SBO: Due to mass above. Seems to have resolved.  - s/p gastrostomy which will be clamped at DC.   Acute postoperative blood loss anemia: Mild, asymptomatic.  - Monitor intermittently or prn bleeding.  Obesity: Estimated body mass index is 34.17 kg/m as calculated from the following:   Height as of this encounter: 5\' 11"  (1.803 m).   Weight as of this encounter: 111.1 kg.  DVT prophylaxis: Lovenox Code Status: Full Family Communication: Wife at bedside  Disposition Plan:  Status is: Inpatient  Remains inpatient appropriate because:Ongoing diagnostic testing needed not appropriate for outpatient work up  Dispo: The patient is from: Home              Anticipated d/c is to: Home              Patient currently is not medically stable to d/c.  Consultants:   General surgery  Oncology  Procedures:   NG tube insertion  Exploratory laparotomy 3/18  Antimicrobials:  Zosyn   Subjective: Up OOB more steady with better strength over past 24 hours, using less pain medications. +BM, significant flatus. Willing to advance diet, eager to DC and get Tx for malignancy. "If you didn't know I was sick, you wouldn't know I was sick."   Objective: Vitals:   12/01/20 1157 12/01/20 1739 12/01/20 2320 12/02/20 0546  BP: 123/69 138/77 134/76 (!) 149/91  Pulse: 78 70 64 64  Resp: 20 20 18 18   Temp: 98.6 F (37 C) 98.8 F (37.1 C) 98.5 F (36.9 C) 98.5 F (36.9 C)  TempSrc: Oral Oral Oral Oral  SpO2: 93% 97% 97% 96%  Weight:      Height:        Intake/Output Summary (Last  24 hours) at 12/02/2020 0951 Last data filed at 12/02/2020 4403 Gross per 24 hour  Intake 240 ml  Output 1150 ml  Net -910 ml   Filed Weights   11/27/20 0018 11/29/20 0923  Weight: 111.1 kg 111.1 kg   Gen: 55 y.o. male in no distress Pulm: Nonlabored breathing room air. Clear. CV: Regular rate and rhythm. No murmur, rub, or gallop. No JVD, no dependent edema. GI: Abdomen soft, minimally tender,  non-distended, with normoactive bowel sounds.  Ext: Warm, no deformities Skin: No new rashes, lesions or ulcers on visualized skin. G tube site not examined as surgery had just examined it. Midline incision is hemostatic without exudate, erythema or significant tenderness. Neuro: Alert and oriented. No focal neurological deficits. Psych: Judgement and insight appear fair. Mood euthymic & affect congruent. Behavior is appropriate.    Data Reviewed: I have personally reviewed following labs and imaging studies  CBC: Recent Labs  Lab 11/27/20 0612 11/28/20 0158 11/29/20 0150 11/30/20 0903 12/01/20 0033  WBC 11.0* 7.3 7.8 11.2* 7.5  HGB 14.7 14.0 13.7 14.3 12.6*  HCT 43.6 41.7 39.2 42.7 36.3*  MCV 88.4 89.1 86.2 89.1 86.0  PLT 276 273 255 301 474   Basic Metabolic Panel: Recent Labs  Lab 11/27/20 0022 11/27/20 0612 11/28/20 0158 11/29/20 0150 12/01/20 0033  NA 136  --  138 135 138  K 4.3  --  4.0 3.7 3.6  CL 103  --  103 102 104  CO2 25  --  26 26 24   GLUCOSE 112*  --  99 93 83  BUN 11  --  10 7 11   CREATININE 1.25* 1.16 1.26* 1.08 1.13  CALCIUM 9.5  --  8.6* 8.7* 8.1*   GFR: Estimated Creatinine Clearance: 93.6 mL/min (by C-G formula based on SCr of 1.13 mg/dL). Liver Function Tests: Recent Labs  Lab 11/27/20 0022  AST 22  ALT 21  ALKPHOS 85  BILITOT 0.9  PROT 8.0  ALBUMIN 4.3   Recent Labs  Lab 11/27/20 0022  LIPASE 34   No results for input(s): AMMONIA in the last 168 hours. Coagulation Profile: No results for input(s): INR, PROTIME in the last 168 hours. Cardiac Enzymes: No results for input(s): CKTOTAL, CKMB, CKMBINDEX, TROPONINI in the last 168 hours. BNP (last 3 results) No results for input(s): PROBNP in the last 8760 hours. HbA1C: No results for input(s): HGBA1C in the last 72 hours. CBG: No results for input(s): GLUCAP in the last 168 hours. Lipid Profile: No results for input(s): CHOL, HDL, LDLCALC, TRIG, CHOLHDL, LDLDIRECT in the last 72  hours. Thyroid Function Tests: No results for input(s): TSH, T4TOTAL, FREET4, T3FREE, THYROIDAB in the last 72 hours. Anemia Panel: No results for input(s): VITAMINB12, FOLATE, FERRITIN, TIBC, IRON, RETICCTPCT in the last 72 hours. Urine analysis:    Component Value Date/Time   COLORURINE YELLOW 11/27/2020 0612   APPEARANCEUR CLEAR 11/27/2020 0612   LABSPEC >1.046 (H) 11/27/2020 0612   PHURINE 5.0 11/27/2020 0612   GLUCOSEU NEGATIVE 11/27/2020 0612   HGBUR NEGATIVE 11/27/2020 0612   BILIRUBINUR NEGATIVE 11/27/2020 0612   KETONESUR 5 (A) 11/27/2020 0612   PROTEINUR NEGATIVE 11/27/2020 0612   NITRITE NEGATIVE 11/27/2020 0612   LEUKOCYTESUR NEGATIVE 11/27/2020 0612   Recent Results (from the past 240 hour(s))  SARS CORONAVIRUS 2 (TAT 6-24 HRS) Nasopharyngeal Nasopharyngeal Swab     Status: None   Collection Time: 11/27/20  3:32 AM   Specimen: Nasopharyngeal Swab  Result Value Ref Range Status  SARS Coronavirus 2 NEGATIVE NEGATIVE Final    Comment: (NOTE) SARS-CoV-2 target nucleic acids are NOT DETECTED.  The SARS-CoV-2 RNA is generally detectable in upper and lower respiratory specimens during the acute phase of infection. Negative results do not preclude SARS-CoV-2 infection, do not rule out co-infections with other pathogens, and should not be used as the sole basis for treatment or other patient management decisions. Negative results must be combined with clinical observations, patient history, and epidemiological information. The expected result is Negative.  Fact Sheet for Patients: SugarRoll.be  Fact Sheet for Healthcare Providers: https://www.woods-mathews.com/  This test is not yet approved or cleared by the [place] FDA and  has been authorized for detection and/or diagnosis of SARS-CoV-2 by FDA under an Emergency Use Authorization (EUA). This EUA will remain  in effect (meaning this test can be used) for the duration  of the COVID-19 declaration under Se ction 564(b)(1) of the Act, 21 U.S.C. section 360bbb-3(b)(1), unless the authorization is terminated or revoked sooner.  Performed at [facility], [place] [address] [place], [place] [address]       Radiology Studies: No results found.  Scheduled Meds: . Chlorhexidine Gluconate Cloth  6 each Topical Once  . enoxaparin (LOVENOX) injection  40 mg Subcutaneous Daily  . ketorolac  15 mg Intravenous Q8H   Continuous Infusions: . methocarbamol (ROBAXIN) IV       LOS: 5 days   Time spent: 25 minutes.  Patrecia Pour, MD [facility] [email or web address] 12/02/2020, 9:51 AM

## 2020-12-02 NOTE — Progress Notes (Signed)
COURTESY NOTE:  Preliminary path report c/w adenocarcinoma; additional studies pending. I am referring Mr Dino to Dr [name] in our GI oncology group. He will be contacted with an outpatient appt  Attempted to call patient in room but there was no answer

## 2020-12-03 DIAGNOSIS — E785 Hyperlipidemia, unspecified: Secondary | ICD-10-CM

## 2020-12-03 MED ORDER — OXYCODONE HCL 5 MG PO TABS: 5.0000 mg | ORAL_TABLET | Freq: Four times a day (QID) | ORAL | 0 refills | Status: DC | PRN

## 2020-12-03 NOTE — Progress Notes (Signed)
Progress Note  4 Days Post-Op  Subjective: Patient reports he tolerated soft diet and is having bowel function. Some abdominal pain but not severe. He would like to shower and would like to know when he can get home. He is asking how long he will need to have gastrostomy tube. He has not had any teaching on how to care for g-tube yet.  Objective: Vital signs in last 24 hours: Temp:  [97.8 F (36.6 C)-98.4 F (36.9 C)] 98.3 F (36.8 C) (03/22 0513) Pulse Rate:  [57-68] 62 (03/22 0513) Resp:  [16-18] 16 (03/22 0513) BP: (119-143)/(79-92) 137/92 (03/22 0513) SpO2:  [96 %-99 %] 97 % (03/22 0513) Last BM Date: 12/02/20  Intake/Output from previous day: 03/21 0701 - 03/22 0700 In: 300 [P.O.:300] Out: -  Intake/Output this shift: No intake/output data recorded.  PE: General: pleasant, WD, overweight male who is laying in bed in NAD Heart: regular, rate, and rhythm.   Lungs: CTAB, no wheezes, rhonchi, or rales noted.  Respiratory effort nonlabored Abd: soft, appropriately ttp, ND, +BS, midline incision c/d/i with staples present, gastrostomy tube to gravity (I clamped) MS: all 4 extremities are symmetrical with no cyanosis, clubbing, or edema. Skin: warm and dry with no masses, lesions, or rashes Neuro: Cranial nerves 2-12 grossly intact, sensation is normal throughout Psych: A&Ox3 with an appropriate affect.    Lab Results:  Recent Labs    11/30/20 0903 12/01/20 0033  WBC 11.2* 7.5  HGB 14.3 12.6*  HCT 42.7 36.3*  PLT 301 256   BMET Recent Labs    12/01/20 0033  NA 138  K 3.6  CL 104  CO2 24  GLUCOSE 83  BUN 11  CREATININE 1.13  CALCIUM 8.1*   PT/INR No results for input(s): LABPROT, INR in the last 72 hours. CMP     Component Value Date/Time   NA 138 12/01/2020 0033   NA 138 09/04/2020 0911   K 3.6 12/01/2020 0033   CL 104 12/01/2020 0033   CO2 24 12/01/2020 0033   GLUCOSE 83 12/01/2020 0033   BUN 11 12/01/2020 0033   BUN 15 09/04/2020 0911    CREATININE 1.13 12/01/2020 0033   CALCIUM 8.1 (L) 12/01/2020 0033   PROT 8.0 11/27/2020 0022   PROT 7.3 09/04/2020 0911   ALBUMIN 4.3 11/27/2020 0022   ALBUMIN 4.6 09/04/2020 0911   AST 22 11/27/2020 0022   ALT 21 11/27/2020 0022   ALKPHOS 85 11/27/2020 0022   BILITOT 0.9 11/27/2020 0022   BILITOT 0.6 09/04/2020 0911   GFRNONAA >60 12/01/2020 0033   GFRAA 88 09/04/2020 0911   Lipase     Component Value Date/Time   LIPASE 34 11/27/2020 0022       Studies/Results: No results found.  Anti-infectives: Anti-infectives (From admission, onward)   Start     Dose/Rate Route Frequency Ordered Stop   11/29/20 0600  cefoTEtan (CEFOTAN) 2 g in sodium chloride 0.9 % 100 mL IVPB  Status:  Discontinued        2 g 200 mL/hr over 30 Minutes Intravenous On call to O.R. 11/28/20 1352 11/29/20 1329   11/27/20 1430  piperacillin-tazobactam (ZOSYN) IVPB 3.375 g  Status:  Discontinued        3.375 g 12.5 mL/hr over 240 Minutes Intravenous Every 8 hours 11/27/20 1414 11/29/20 1329   11/27/20 0330  cefTRIAXone (ROCEPHIN) 2 g in sodium chloride 0.9 % 100 mL IVPB       "And" Linked Group Details  2 g 200 mL/hr over 30 Minutes Intravenous  Once 11/27/20 0329 11/27/20 0433   11/27/20 0330  metroNIDAZOLE (FLAGYL) IVPB 500 mg       "And" Linked Group Details   500 mg 100 mL/hr over 60 Minutes Intravenous  Once 11/27/20 0329 11/27/20 0434       Assessment/Plan HLD Obesity BMI 34.17  Malignant SBO Peritoneal carcinomatosis  -s/p exlap, omental and peritoneal biopsy and g-tube placement 3/18 by Dr. [name]. Intra-operative confirmation of metastatic disease involving SB mesentery, colon epiploicae, omentum, pelvis, peritoneum - POD#4 - continue soft diet but clamp G-tube - asked RN to do some teaching on how to connect to gravity if needed and how to flush gastric tube today  - ok to shower - stable for discharge from a surgical standpoint when tolerating soft diet with g-tube clamped, will  work on arranging follow up  - prelim path consistent with adenocarcinoma, ONC aware and following   FEN: soft diet, G tube clamped VTE: lovenox ID: No current abx  LOS: 6 days    Norm Parcel , [facility] 12/03/2020, 8:48 AM Please see Amion for pager number during day hours 7:00am-4:30pm

## 2020-12-03 NOTE — Discharge Instructions (Signed)
Gastrostomy Tube Home Guide, Adult A gastrostomy tube, or G-tube, is a tube that is inserted through the abdomen into the stomach. The tube is used to give feedings and medicines when a person cannot eat and drink enough on his or her own or take medicines by mouth. How to care for the insertion site Supplies needed:  Saline solution or clean, warm water and soap. Saline solution is made of salt and water.  Cotton swab or gauze.  Pre-cut gauze bandage (dressing) and tape, if needed. Instructions Follow these steps daily to clean the insertion site: 1. Wash your hands with soap and water for at least 20 seconds. 2. Remove the dressing (if there is one) that is between the person's skin and the tube. 3. Check the area where the tube enters the skin. Check daily for problems such as: ? Redness, rash, or irritation. ? Swelling. ? Pus-like drainage. ? Extra skin growth. 4. Moisten the cotton swab or gauze with the saline solution or with a soap-and-water mixture. Gently clean around the insertion site. Remove any drainage or crusted material. ? When the G-tube is first put in, a normal saline solution or water can be used to clean the skin. ? After the skin around the tube has healed, mild soap and water may be used. 5. Apply a dressing (if there should be one) between the person's skin and the tube.   How to flush a G-tube Flush the G-tube regularly to keep it from clogging. Flush it before and after feedings and as often as told by the health care provider. Supplies needed:  Purified or germ-free (sterile) water, warmed.  Container with lid for boiling water, if needed.  60 cc G-tube syringe. Instructions Before you begin, decide whether to use sterile water or purified drinking water.  Use only sterile water if: ? The person has a weak disease-fighting (immune) system. ? The person has trouble fighting off infections (is immunocompromised). ? You are unsure about the amount of  chemical contaminants in purified or drinking water.  Use purified drinking water in all other cases. To purify drinking water by boiling: ? Boil water for at least 1 minute. Keep lid over water while it boils. ? Let water cool to room temperature before using. Follow these steps to flush the G-tube: 1. Wash your hands with soap and water for at least 20 seconds. 2. Bring out (draw up) 30 mL of warm water in a syringe. 3. Connect the syringe to the tube. 4. Slowly and gently push the water into the tube. General tips  If the tube comes out: ? Cover the opening with a clean dressing and tape. ? Get help right away.  If there is skin or scar tissue growing where the tube enters the skin: ? Keep the area clean and dry. ? Secure the tube with tape so that the tube does not move around too much.  If the tube gets clogged: ? Slowly push warm water into the tube with a large syringe. ? Do not force the fluid into the tube or push an object into the tube. ? Get help right away if you cannot unclog the tube. Follow these instructions at home: Feedings  Give feedings at room temperature.  If feedings are continuous: ? Do not put more than 4 hours' worth of feedings in the feeding bag. ? Stop the feedings when you need to give medicine or flush the tube. Be sure to restart the feedings. ? Make sure  the person's head is above his or her stomach (upright position). This will prevent choking and discomfort.  Make sure the person is in the right position during and after feedings. ? During feedings, have the person in the upright position. ? After a non-continuous feeding (bolus feeding), have the person stay in the upright position for 1 hour.  Cover and place unused feedings in the refrigerator.  Replace feeding bags and syringes as told. Good hygiene  Make sure the person takes good care of his or her mouth and teeth (oral hygiene), such as by brushing his or her teeth.  Keep the  area where the tube enters the skin clean and dry. General instructions  Use syringes made only for G-tubes.  Do not pull or put tension on the tube.  Before you remove the tube cap or disconnect a syringe, close the tube by using a clamp (clamping) or bending (kinking) the tube.  Measure the length of the G-tube every day from the insertion site to the end of the tube.  If the person's G-tube has a balloon, check the fluid in the balloon every week. Check the manufacturer's specifications to find the amount of fluid that should be in the balloon.  Remove excess air from the G-tube as told. This is called venting.  Do not push feedings, medicines, or flushes fast. Contact a health care provider if:  The person with the tube has constipation or a fever.  A large amount of fluid or mucus-like liquid is leaking from the tube.  Skin or scar tissue appears to be growing where the tube enters the skin.  The length of tube from the insertion site to the G-tube gets longer. Get help right away if:  The person with the tube has any of these problems: ? Severe pain, tenderness, or bloating in the abdomen. ? Nausea or vomiting. ? Trouble breathing or shortness of breath.  Any of these problems happen in the area where the tube enters the skin: ? Redness, irritation, swelling, or soreness. ? Pus-like discharge. ? A bad smell.  The tube is clogged and cannot be flushed.  The tube comes out. The tube will need to put back in within 4 hours. Summary  A gastrostomy tube, or G-tube, is a tube that is inserted through the abdomen into the stomach. The tube is used to give feedings and medicines when a person cannot eat and drink enough on his or her own or cannot take medicine by mouth.  Check and clean the insertion site daily as told by the person's health care provider.  Flush the G-tube regularly to keep it from clogging. Flush it before and after feedings and as often as  told.  Keep the area where the tube enters the skin clean and dry. This information is not intended to replace advice given to you by your health care provider. Make sure you discuss any questions you have with your health care provider. Document Revised: 01/15/2020 Document Reviewed: 01/18/2020 Elsevier Patient Education  2021 [facility], [place] [phone]  OPEN ABDOMINAL SURGERY: POST OP INSTRUCTIONS  Always review your discharge instruction sheet given to you by the facility where your surgery was performed.  IF YOU HAVE DISABILITY OR FAMILY LEAVE FORMS, YOU MUST BRING THEM TO THE OFFICE FOR PROCESSING.  PLEASE DO NOT GIVE THEM TO YOUR DOCTOR.  1. A prescription for pain medication may be given to you upon discharge.  Take your pain medication as prescribed, if needed.  If narcotic pain medicine is not needed, then you may take acetaminophen (Tylenol) or ibuprofen (Advil) as needed. 2. Take your usually prescribed medications unless otherwise directed. 3. If you need a refill on your pain medication, please contact your pharmacy. They will contact our office to request authorization.  Prescriptions will not be filled after 5pm or on week-ends. 4. You should follow a light diet the first few days after arrival home, such as soup and crackers, pudding, etc.unless your doctor has advised otherwise. A high-fiber, low fat diet can be resumed as tolerated.   Be sure to include lots of fluids daily. Most patients will experience some swelling and bruising on the chest and neck area.  Ice packs will help.  Swelling and bruising can take several days to resolve 5. Most patients will experience some swelling and bruising in the area of the incision. Ice pack will help. Swelling and bruising can take several days to resolve..  6. It is common to experience some constipation if taking pain medication after surgery.  Increasing fluid intake and taking a stool softener  will usually help or prevent this problem from occurring.  A mild laxative (Milk of Magnesia or Miralax) should be taken according to package directions if there are no bowel movements after 48 hours. 7.  You may have steri-strips (small skin tapes) in place directly over the incision.  These strips should be left on the skin for 7-10 days.  If your surgeon used skin glue on the incision, you may shower in 24 hours.  The glue will flake off over the next 2-3 weeks.  Any sutures or staples will be removed at the office during your follow-up visit. You may find that a light gauze bandage over your incision may keep your staples from being rubbed or pulled. You may shower and replace the bandage daily. 8. ACTIVITIES:  You may resume regular (light) daily activities beginning the next day--such as daily self-care, walking, climbing stairs--gradually increasing activities as tolerated.  You may have sexual intercourse when it is comfortable.  Refrain from any heavy lifting or straining until approved by your doctor. a. You may drive when you no longer are taking prescription pain medication, you can comfortably wear a seatbelt, and you can safely maneuver your car and apply brakes  9. You should see your doctor in the office for a follow-up appointment approximately two weeks after your surgery.  Make sure that you call for this appointment within a day or two after you arrive home to insure a convenient appointment time.   WHEN TO CALL YOUR DOCTOR: 1. Fever over 101.0 2. Inability to urinate 3. Nausea and/or vomiting 4. Extreme swelling or bruising 5. Continued bleeding from incision. 6. Increased pain, redness, or drainage from the incision. 7. Difficulty swallowing or breathing 8. Muscle cramping or spasms. 9. Numbness or tingling in hands or feet or around lips.  The clinic staff is available to answer your questions during regular business hours.  Please don't hesitate to call and ask to speak to  one of the nurses if you have concerns.  For further questions, please visit [email or web address]

## 2020-12-03 NOTE — Discharge Summary (Signed)
Physician Discharge Summary  RAFIQ BUCKLIN WLN:[id number] DOB: [birth year] DOA: 11/27/2020  PCP: [name], MD  Admit date: 11/27/2020 Discharge date: 12/03/2020  Admitted From: Home Disposition: Home   Recommendations for Outpatient Follow-up:  1. Follow up with oncology in the next couple days for new diagnosis adenocarcinoma. 2. Follow up with [facility] for postop wound check.   Home Health: None Equipment/Devices: G tube Discharge Condition: Stable CODE STATUS: Full Diet recommendation: Soft  Brief/Interim Summary: Ricardo Peer Cappsis a81 y.o.malewith a history of HLD, laparoscopic appendectomy May 2020, and several weeks of eructation, intermittent central abdominal tenderness and cramping who presented to the ED 3/16 with worsening, severe pain reported to radiate to the back associated with nausea and vomiting and decreased stool output. WBC 12.8k, CTA chest/abd/pelvis showed no dissection but revealed terminal ileum thickening with suggested small bowel obstruction. CT abd/pelvis with PO and IV contrast is delayed by recent dye load until 3/17 AM per surgery recommendations. NGT was inserted and symptoms have improved. Repeat CT with contrast reveals circumferential thickening of the ileum with regional lymphadenopathy and peritoneal nodularity and caking suggestive of new diagnosis of malignancy. Dr. [name] performed ex lap with omental and peritoneal biopsy with g-tube placement on 3/18. On 3/21, diet advanced to soft and was tolerated again on 3/22 with capped G tube. Surgery has cleared for discharge. Oncology was consulted with path report showing adenocarcinoma. They will arrange short interval follow up to initiate plan of treatment after discharge.   Discharge Diagnoses:  Principal Problem:   SBO (small bowel obstruction) (HCC) Active Problems:   Ileitis   Intractable abdominal pain   Nausea and vomiting   Hyperlipidemia   Lymphadenopathy,  abdominal  Metastatic cancer of unknown primary source: Mass of ileum with regional lymphadenopathy and omental and peritoneal nodularity/caking consistent with metastatic cancer. Appendix or gallbladder could be sources. Note the patient had acute appendicitis May 2020 s/p appendectomy.  - Cleared for DC per surgery since he's tolerating soft food with G tube capped and only on oral analgesics.  - Follow up per surgery.  - Oncology consulted and following, say they will arrange follow up quickly with Dr. Burr Medico. Path results showing adenocarcinoma.  Partial SBO: Due to mass above. Seems to have resolved.  - s/p gastrostomy which is clamped and will need flushes. Surgery has provided this education  Acute postoperative blood loss anemia: Mild, asymptomatic.  - Monitor intermittently or prn bleeding.  Obesity: Estimated body mass index is 34.17 kg/m   Discharge Instructions Discharge Instructions    Diet general   Complete by: As directed    Discharge instructions   Complete by: As directed    Follow up with the surgery center for follow up of the abdominal wound as directed. If you don't hear from them in the next 1-2 days, call their office. You should NOT return to work until after you are cleared by them to do so.   Follow up with the cancer center, Dr. Burr Medico ASAP. Call if you aren't contacted.   You can take oxycodone as needed for severe pain, otherwise take tylenol.   Keep caring for the G tube with intermittent flushing.   Discharge wound care:   Complete by: As directed    As directed by general surgery. Do not submerge. No baths. Ok to shower but keep as dry as possible.   Increase activity slowly   Complete by: As directed      Allergies as of 12/03/2020  No Known Allergies     Medication List    STOP taking these medications   hyoscyamine 0.125 MG SL tablet Commonly known as: LEVSIN SL   Plenvu 140 g Solr Generic drug: PEG-KCl-NaCl-NaSulf-Na Asc-C     TAKE  these medications   acetaminophen 325 MG tablet Commonly known as: TYLENOL Take 650 mg by mouth every 6 (six) hours as needed for mild pain.   oxyCODONE 5 MG immediate release tablet Commonly known as: Oxy IR/ROXICODONE Take 1-2 tablets (5-10 mg total) by mouth every 6 (six) hours as needed for up to 5 days for moderate pain or severe pain.   rosuvastatin 20 MG tablet Commonly known as: Crestor Take 1 tablet (20 mg total) by mouth daily.            Discharge Care Instructions  (From admission, onward)         Start     Ordered   12/03/20 0000  Discharge wound care:       Comments: As directed by general surgery. Do not submerge. No baths. Ok to shower but keep as dry as possible.   12/03/20 0930          Follow-up Information    [name], MD Follow up.   Specialty: Internal Medicine Contact information: [place] [address] [phone]        [facility] [place] Follow up.   Specialty: General Surgery Contact information: [address] [place] [address] [phone]        [name], MD. Schedule an appointment as soon as possible for a visit.   Specialties: Hematology, Oncology Contact information: [place] [address] [phone]              No Known Allergies  Consultations:  Surgery  Oncology  Procedures/Studies: DG Abdomen 1 View  Result Date: 11/27/2020 CLINICAL DATA:  Nasogastric tube placement EXAM: ABDOMEN - 1 VIEW COMPARISON:  CT from earlier today FINDINGS: Enteric tube with tip and side-port over the stomach. Known small bowel obstruction. Mild atelectasis at the right base. IMPRESSION: Enteric tube with tip in the mid stomach. Electronically Signed   By: Monte Fantasia M.D.   On: 11/27/2020 07:44   CT ABDOMEN PELVIS W CONTRAST  Result Date: 11/28/2020 CLINICAL DATA:  Bowel obstruction suspected EXAM: CT ABDOMEN AND PELVIS WITH CONTRAST TECHNIQUE:  Multidetector CT imaging of the abdomen and pelvis was performed using the standard protocol following bolus administration of intravenous contrast. CONTRAST:  160mL OMNIPAQUE IOHEXOL 300 MG/ML SOLN, additional oral enteric contrast COMPARISON:  Abdominal radiographs, 11/27/2020, CT chest abdomen pelvis angiogram, 11/27/2020 CT abdomen pelvis, 01/17/2019 FINDINGS: Lower chest: Atelectasis or consolidation of the dependent bilateral lung bases, new compared to prior examination. Hepatobiliary: No solid liver abnormality is seen. No gallstones, gallbladder wall thickening, or biliary dilatation. Pancreas: Unremarkable. No pancreatic ductal dilatation or surrounding inflammatory changes. Spleen: Normal in size without significant abnormality. Adrenals/Urinary Tract: Adrenal glands are unremarkable. Kidneys are normal, without renal calculi, solid lesion, or hydronephrosis. Bladder is unremarkable. Stomach/Bowel: Esophagogastric tube with tip and side port below the diaphragm. Stomach is within normal limits. Appendix is surgically absent. The small bowel is generally decompressed, with full some fluid-filled, nondistended loops throughout. There is transit of oral enteric contrast to the terminal ileum. There is masslike, circumferential thickening of the terminal ileum and cecal base near the ileocecal valve (series 8, image 82, series 5, image 57). Sigmoid diverticulosis. Vascular/Lymphatic: Aortic  atherosclerosis. There are abnormally enlarged lymph nodes in the right lower quadrant mesentery adjacent to the terminal ileum measuring up to 2.3 x 1.4 cm (series 5, image 52). Reproductive: No mass or other significant abnormality. Other: Small, fat containing left inguinal hernia. There is small volume perihepatic and perisplenic ascites. There is extensive omental and peritoneal nodularity and caking (series 5, image 38, 72). Musculoskeletal: No acute or significant osseous findings. IMPRESSION: 1. There is masslike,  circumferential thickening of the terminal ileum and cecal base near the ileocecal valve and abnormally enlarged lymph nodes in the right lower quadrant mesentery adjacent to the terminal ileum measuring up to 2.3 x 1.4 cm. 2. There is extensive omental and peritoneal nodularity and caking throughout the abdomen. 3. Findings are highly concerning for primary colon malignancy with nodal and peritoneal metastatic disease. 4. Small volume perihepatic and perisplenic ascites. 5. The small bowel is generally decompressed, with full some fluid-filled, nondistended loops throughout. There is transit of oral enteric contrast to the terminal ileum. No evidence of overt bowel obstruction at this time. Esophagogastric tube is position with tip and side port below the diaphragm. 6. Atelectasis or consolidation of the dependent bilateral lung bases, new compared to prior examination. Aortic Atherosclerosis (ICD10-I70.0). Electronically Signed   By: Eddie Candle M.D.   On: 11/28/2020 11:08   CT Angio Chest/Abd/Pel for Dissection W and/or Wo Contrast  Result Date: 11/27/2020 CLINICAL DATA:  Abdominal aortic dissection suspected. Presenting with abdominal pain. EXAM: CT ANGIOGRAPHY CHEST, ABDOMEN AND PELVIS TECHNIQUE: Non-contrast CT of the chest was initially obtained. Multidetector CT imaging through the chest, abdomen and pelvis was performed using the standard protocol during bolus administration of intravenous contrast. Multiplanar reconstructed images and MIPs were obtained and reviewed to evaluate the vascular anatomy. CONTRAST:  160mL OMNIPAQUE IOHEXOL 350 MG/ML SOLN COMPARISON:  CT abdomen 01/17/2019. FINDINGS: CTA CHEST FINDINGS Cardiovascular: Preferential opacification of the thoracic aorta. No evidence of thoracic aortic aneurysm or dissection. Mild atherosclerotic plaque of the thoracic aorta. Normal heart size. No significant pericardial effusion. At least mild four-vessel coronary artery calcifications. The main  pulmonary artery is normal in caliber. Mediastinum/Nodes: No enlarged mediastinal, hilar, or axillary lymph nodes. Thyroid gland, trachea, and esophagus demonstrate no significant findings. Lungs/Pleura: Bilateral lower lobe subsegmental atelectasis. Linear atelectasis within the lingula. Subpleural right micronodule that appears to be triangular in morphology likely represents an intrapulmonary lymph node (7:75). No pulmonary mass. No focal consolidation. No pleural effusion. No pneumothorax. Musculoskeletal: No chest wall abnormality. No suspicious lytic or blastic osseous lesions. No acute displaced fracture. CTA ABDOMEN AND PELVIS FINDINGS VASCULAR Aorta: Mild atherosclerotic plaque. Normal caliber aorta without aneurysm, dissection, vasculitis or significant stenosis. Celiac: Patent without evidence of aneurysm, dissection, vasculitis or significant stenosis. SMA: Patent without evidence of aneurysm, dissection, vasculitis or significant stenosis. Renals: Both renal arteries are patent without evidence of aneurysm, dissection, vasculitis, fibromuscular dysplasia or significant stenosis. IMA: Patent without evidence of aneurysm, dissection, vasculitis or significant stenosis. Inflow: Mild atherosclerotic plaque. Patent without evidence of aneurysm, dissection, vasculitis or significant stenosis. Veins: No obvious venous abnormality within the limitations of this arterial phase study. NON-VASCULAR Hepatobiliary: An 8 mm hypodensity within the right hepatic lobe is similar to prior likely represents a hepatic hemangioma. No new focal liver abnormality. No gallstones, gallbladder wall thickening, or pericholecystic fluid. No biliary dilatation. Pancreas: No focal lesion. Normal pancreatic contour. No surrounding inflammatory changes. No main pancreatic ductal dilatation. Spleen: Normal in size without focal abnormality. Adrenals/Urinary Tract: No adrenal nodule  bilaterally. Bilateral kidneys enhance symmetrically.  No hydronephrosis. No hydroureter. The urinary bladder is unremarkable. Stomach/Bowel: Stomach is within normal limits. Thickening of the terminal ileum. Multiple loops of mid to distal small bowel within the anterior mid and lower abdomen are distended with fluid with some air-fluid levels noted. Surrounding mesenteric edema is noted. No evidence of large bowel wall thickening or dilatation. Scattered colonic diverticulosis. No pneumatosis. Status post appendectomy. Lymphatic: Right lower quadrant lymphadenopathy with as an example a 1.3 cm lymph node (6:197). Reproductive: Prostate is unremarkable. Other: Trace simple free fluid within the lower abdomen and pelvis. No intraperitoneal free gas. No organized fluid collection. Musculoskeletal: No abdominal wall hernia or abnormality. No suspicious lytic or blastic osseous lesions. No acute displaced fracture. Review of the MIP images confirms the above findings. IMPRESSION: 1. Thickening of the terminal ileum with associated proximal mid to distal small bowel obstruction. Findings could be due to an ileitis versus malignancy. Nonspecific mesenteric edema could be due to engorgement versus metastases. No associated bowel perforation. Recommend endoscopy for further evaluation. 2. Indeterminate right lower quadrant lymphadenopathy. 3. Scattered colonic diverticulosis with no acute diverticulitis. 4. Stable right hepatic lobe subcentimeter hyperdensity likely represents a hepatic hemangioma. 5. No acute vascular abnormality. Aortic Atherosclerosis (ICD10-I70.0) - mild. 6. No acute intrathoracic abnormality. Electronically Signed   By: Iven Finn M.D.   On: 11/27/2020 03:07     Subjective: Wants to go home. Abdominal pain is manageable, eating soft diet.   Discharge Exam: Vitals:   12/02/20 2327 12/03/20 0513  BP: (!) 143/83 (!) 137/92  Pulse: 63 62  Resp: 18 16  Temp: 98.4 F (36.9 C) 98.3 F (36.8 C)  SpO2: 99% 97%   General: Pt is alert, awake,  not in acute distress Cardiovascular: RRR, S1/S2 +, no rubs, no gallops Respiratory: CTA bilaterally, no wheezing, no rhonchi Abdominal: Soft, appropriately tender without guarding, incision c/d/i with staples and G tube clamped, ND, bowel sounds + Extremities: No edema, no cyanosis  Labs: BNP (last 3 results) No results for input(s): BNP in the last 8760 hours. Basic Metabolic Panel: Recent Labs  Lab 11/27/20 0022 11/27/20 0612 11/28/20 0158 11/29/20 0150 12/01/20 0033  NA 136  --  138 135 138  K 4.3  --  4.0 3.7 3.6  CL 103  --  103 102 104  CO2 25  --  26 26 24   GLUCOSE 112*  --  99 93 83  BUN 11  --  10 7 11   CREATININE 1.25* 1.16 1.26* 1.08 1.13  CALCIUM 9.5  --  8.6* 8.7* 8.1*   Liver Function Tests: Recent Labs  Lab 11/27/20 0022  AST 22  ALT 21  ALKPHOS 85  BILITOT 0.9  PROT 8.0  ALBUMIN 4.3   Recent Labs  Lab 11/27/20 0022  LIPASE 34   No results for input(s): AMMONIA in the last 168 hours. CBC: Recent Labs  Lab 11/27/20 0612 11/28/20 0158 11/29/20 0150 11/30/20 0903 12/01/20 0033  WBC 11.0* 7.3 7.8 11.2* 7.5  HGB 14.7 14.0 13.7 14.3 12.6*  HCT 43.6 41.7 39.2 42.7 36.3*  MCV 88.4 89.1 86.2 89.1 86.0  PLT 276 273 255 301 256   Cardiac Enzymes: No results for input(s): CKTOTAL, CKMB, CKMBINDEX, TROPONINI in the last 168 hours. BNP: Invalid input(s): POCBNP CBG: No results for input(s): GLUCAP in the last 168 hours. D-Dimer No results for input(s): DDIMER in the last 72 hours. Hgb A1c No results for input(s): HGBA1C in the last 72  hours. Lipid Profile No results for input(s): CHOL, HDL, LDLCALC, TRIG, CHOLHDL, LDLDIRECT in the last 72 hours. Thyroid function studies No results for input(s): TSH, T4TOTAL, T3FREE, THYROIDAB in the last 72 hours.  Invalid input(s): FREET3 Anemia work up No results for input(s): VITAMINB12, FOLATE, FERRITIN, TIBC, IRON, RETICCTPCT in the last 72 hours. Urinalysis    Component Value Date/Time   COLORURINE  YELLOW 11/27/2020 0612   APPEARANCEUR CLEAR 11/27/2020 0612   LABSPEC >1.046 (H) 11/27/2020 0612   PHURINE 5.0 11/27/2020 0612   GLUCOSEU NEGATIVE 11/27/2020 0612   HGBUR NEGATIVE 11/27/2020 0612   BILIRUBINUR NEGATIVE 11/27/2020 0612   KETONESUR 5 (A) 11/27/2020 0612   PROTEINUR NEGATIVE 11/27/2020 0612   NITRITE NEGATIVE 11/27/2020 0612   LEUKOCYTESUR NEGATIVE 11/27/2020 0612    Microbiology Recent Results (from the past 240 hour(s))  SARS CORONAVIRUS 2 (TAT 6-24 HRS) Nasopharyngeal Nasopharyngeal Swab     Status: None   Collection Time: 11/27/20  3:32 AM   Specimen: Nasopharyngeal Swab  Result Value Ref Range Status   SARS Coronavirus 2 NEGATIVE NEGATIVE Final    Comment: (NOTE) SARS-CoV-2 target nucleic acids are NOT DETECTED.  The SARS-CoV-2 RNA is generally detectable in upper and lower respiratory specimens during the acute phase of infection. Negative results do not preclude SARS-CoV-2 infection, do not rule out co-infections with other pathogens, and should not be used as the sole basis for treatment or other patient management decisions. Negative results must be combined with clinical observations, patient history, and epidemiological information. The expected result is Negative.  Fact Sheet for Patients: SugarRoll.be  Fact Sheet for Healthcare Providers: https://www.woods-mathews.com/  This test is not yet approved or cleared by the [place] FDA and  has been authorized for detection and/or diagnosis of SARS-CoV-2 by FDA under an Emergency Use Authorization (EUA). This EUA will remain  in effect (meaning this test can be used) for the duration of the COVID-19 declaration under Se ction 564(b)(1) of the Act, 21 U.S.C. section 360bbb-3(b)(1), unless the authorization is terminated or revoked sooner.  Performed at [facility], [place] [address] [place], [place] [address]     Time coordinating discharge:  Approximately 40 minutes  Patrecia Pour, MD  [facility] 12/03/2020, 9:31 AM

## 2020-12-03 NOTE — Progress Notes (Signed)
RN went over discharge instructions with the patient as well as G tube maintenance instructions. Pt and wife taught how to flush the tube as well as place it to gravity, and how to change the dressing. Pt sent home with belongings bag containing drainage bag for gravity, gauze and drain sponges, syringe and sterile water for flushing. Patient and his wife stated understanding on teaching, 1 new medication escribed to home pharmacy.

## 2020-12-04 ENCOUNTER — Encounter: Payer: Self-pay | Admitting: Hematology

## 2020-12-04 ENCOUNTER — Inpatient Hospital Stay: Payer: BC Managed Care – PPO | Attending: Hematology | Admitting: Hematology

## 2020-12-04 ENCOUNTER — Other Ambulatory Visit: Payer: Self-pay

## 2020-12-04 VITALS — BP 129/92 | HR 80 | Temp 97.2°F | Resp 20 | Ht 71.0 in | Wt 247.2 lb

## 2020-12-04 DIAGNOSIS — C182 Malignant neoplasm of ascending colon: Secondary | ICD-10-CM | POA: Insufficient documentation

## 2020-12-04 DIAGNOSIS — C180 Malignant neoplasm of cecum: Secondary | ICD-10-CM | POA: Insufficient documentation

## 2020-12-04 DIAGNOSIS — C786 Secondary malignant neoplasm of retroperitoneum and peritoneum: Secondary | ICD-10-CM | POA: Insufficient documentation

## 2020-12-04 DIAGNOSIS — R1030 Lower abdominal pain, unspecified: Secondary | ICD-10-CM | POA: Insufficient documentation

## 2020-12-04 DIAGNOSIS — C772 Secondary and unspecified malignant neoplasm of intra-abdominal lymph nodes: Secondary | ICD-10-CM | POA: Insufficient documentation

## 2020-12-04 DIAGNOSIS — Z79891 Long term (current) use of opiate analgesic: Secondary | ICD-10-CM

## 2020-12-04 DIAGNOSIS — Z808 Family history of malignant neoplasm of other organs or systems: Secondary | ICD-10-CM | POA: Diagnosis not present

## 2020-12-04 DIAGNOSIS — G893 Neoplasm related pain (acute) (chronic): Secondary | ICD-10-CM | POA: Diagnosis not present

## 2020-12-04 DIAGNOSIS — Z8042 Family history of malignant neoplasm of prostate: Secondary | ICD-10-CM | POA: Diagnosis not present

## 2020-12-04 DIAGNOSIS — Z803 Family history of malignant neoplasm of breast: Secondary | ICD-10-CM | POA: Insufficient documentation

## 2020-12-04 DIAGNOSIS — R109 Unspecified abdominal pain: Secondary | ICD-10-CM

## 2020-12-04 MED ORDER — PROCHLORPERAZINE MALEATE 10 MG PO TABS: 10.0000 mg | ORAL_TABLET | Freq: Four times a day (QID) | ORAL | 0 refills | Status: DC | PRN

## 2020-12-04 MED ORDER — PROCHLORPERAZINE MALEATE 10 MG PO TABS: 10.0000 mg | ORAL_TABLET | Freq: Four times a day (QID) | ORAL | 1 refills | Status: DC | PRN

## 2020-12-04 MED ORDER — ONDANSETRON HCL 8 MG PO TABS: 8.0000 mg | ORAL_TABLET | Freq: Two times a day (BID) | ORAL | 1 refills | Status: DC | PRN

## 2020-12-04 MED ORDER — OXYCODONE HCL 10 MG PO TABS: 10.0000 mg | ORAL_TABLET | Freq: Four times a day (QID) | ORAL | 0 refills | Status: DC | PRN

## 2020-12-04 MED ORDER — LIDOCAINE-PRILOCAINE 2.5-2.5 % EX CREA: TOPICAL_CREAM | CUTANEOUS | 3 refills | Status: DC

## 2020-12-04 MED ORDER — DICYCLOMINE HCL 10 MG PO CAPS: 10.0000 mg | ORAL_CAPSULE | Freq: Three times a day (TID) | ORAL | 0 refills | Status: DC

## 2020-12-04 NOTE — Progress Notes (Signed)
START ON PATHWAY REGIMEN - Colorectal     A cycle is every 14 days:     Oxaliplatin      Leucovorin      Fluorouracil      Fluorouracil   **Always confirm dose/schedule in your pharmacy ordering system**  Patient Characteristics: Distant Metastases, Nonsurgical Candidate, KRAS/NRAS Mutation Positive/Unknown (BRAF V600 Wild-Type/Unknown), Standard Cytotoxic Therapy, First Line Standard Cytotoxic Therapy, Bevacizumab Ineligible, PS = 0,1 Tumor Location: Colon Therapeutic Status: Distant Metastases Microsatellite/Mismatch Repair Status: Unknown BRAF Mutation Status: Awaiting Test Results KRAS/NRAS Mutation Status: Awaiting Test Results Standard Cytotoxic Line of Therapy: First Line Standard Cytotoxic Therapy ECOG Performance Status: 1 Bevacizumab Eligibility: Ineligible Intent of Therapy: Non-Curative / Palliative Intent, Discussed with Patient 

## 2020-12-04 NOTE — Anesthesia Postprocedure Evaluation (Signed)
Anesthesia Post Note  Patient: Ricardo [name]  Procedure(s) Performed: EXPLORATORY LAPAROTOMY (N/A Abdomen) PARTIAL BOWEL RESECTION (N/A ) POSSIBLE OSTOMY CREATION (N/A Abdomen) PERITIONEAL BIOPSY (N/A Abdomen) INSERTION OF GASTROSTOMY TUBE (Left Abdomen)     Patient location during evaluation: PACU Anesthesia Type: General Level of consciousness: patient cooperative and awake Pain management: pain level controlled Vital Signs Assessment: post-procedure vital signs reviewed and stable Respiratory status: spontaneous breathing, nonlabored ventilation, respiratory function stable and patient connected to nasal cannula oxygen Cardiovascular status: stable and blood pressure returned to baseline Postop Assessment: no apparent nausea or vomiting Anesthetic complications: no   No complications documented.  Last Vitals:  Vitals:   12/02/20 2327 12/03/20 0513  BP: (!) 143/83 (!) 137/92  Pulse: 63 62  Resp: 18 16  Temp: 36.9 C 36.8 C  SpO2: 99% 97%    Last Pain:  Vitals:   12/03/20 0732  TempSrc:   PainSc: 3                  Everli Rother

## 2020-12-04 NOTE — Progress Notes (Signed)
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 [name]  Oncology History Overview Note  Cancer Staging No matching staging information was found for the patient.    Cancer of ascending colon metastatic to intra-abdominal lymph node ([place])  11/27/2020 Imaging   CT Angio CAP  IMPRESSION: 1. Thickening of the terminal ileum with associated proximal mid to distal small bowel obstruction. Findings could be due to an ileitis versus malignancy. Nonspecific mesenteric edema could be due to engorgement versus metastases. No associated bowel perforation. Recommend endoscopy for further evaluation. 2. Indeterminate right lower quadrant lymphadenopathy. 3. Scattered colonic diverticulosis with no acute diverticulitis. 4. Stable right hepatic lobe subcentimeter hyperdensity likely represents a hepatic hemangioma. 5. No acute vascular abnormality. Aortic Atherosclerosis (ICD10-I70.0) - mild. 6. No acute intrathoracic abnormality.   11/28/2020 Imaging   CT AP  IMPRESSION: 1. There is masslike, circumferential thickening of the terminal ileum and cecal base near the ileocecal valve and abnormally enlarged lymph nodes in the right lower quadrant mesentery adjacent to the terminal ileum measuring up to 2.3 x 1.4 cm. 2. There is extensive omental and peritoneal nodularity and caking throughout the abdomen. 3. Findings are highly concerning for primary colon malignancy with nodal and peritoneal metastatic disease. 4. Small volume perihepatic and perisplenic ascites. 5. The small bowel is generally decompressed, with full  some fluid-filled, nondistended loops throughout. There is transit of oral enteric contrast to the terminal ileum. No evidence of overt bowel obstruction at this time. Esophagogastric tube is position with tip and side port below the diaphragm. 6. Atelectasis or consolidation of the dependent bilateral lung bases, new compared to prior examination.   Aortic Atherosclerosis (ICD10-I70.0).     11/29/2020 Surgery   EXPLORATORY LAPAROTOMY, PARTIAL BOWEL RESECTION, POSSIBLE OSTOMY CREATION, PERITIONEAL BIOPSY, INSERTION OF GASTROSTOMY TUBE by Dr [name]   11/29/2020 Initial Biopsy   FINAL MICROSCOPIC DIAGNOSIS:   AB. 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    COMMENT:   Immunohistochemistry (IHC) for CK20 and CDX-2 is strong and diffusely  positive.  CK7, TTF-1, Synaptophysin, Chromogranin and CD56 are  negative.  The immunophenotype is compatible with origin from the lower  gastrointestinal tract.  IHC for MMR will be reported separately.  Case  preliminarily discussed with Dr. [name] on 12/02/2020.   At the request of Dr. [name], [phone] was reviewed in retrospect.  Review of the submitted sections confirms the presence of acute  appendicitis.  No malignancy is identified.    12/04/2020 Initial Diagnosis   Cancer of ascending colon metastatic to intra-abdominal lymph node [name])    Chemotherapy   FOLFOX q2weeks    12/18/2020 -  Chemotherapy    Patient is on Treatment Plan: COLORECTAL FOLFOX Q14D         HISTORY OF PRESENTING ILLNESS:  Ricardo [name] 55 y.o. male is a here because of metastatic colon cancer. The patient was referred by Dr Vivia Birmingham. The patient presents to the clinic today accompanied by his wife.   He notes 8-9 weeks ago he started having lower abominable pain. He is a ongoing cyclist  so assumed this was related. He notes his stool was getting harder and more  constipated. Four weeks later he was seen by his PCP to clear him for long distance biking event. His PCP though his symptoms were functional and recommended colonoscopy. 2 weeks later his pain was getting worse but still intermittent. Last week, after a simple jog he had more intense doubling pain which was not resolving. He went to ED on 11/27/20 and was admitted. Workup showed ileum mass. Exploratory surgery showed cancer with metastasis to his peritoneum. With surgery he uses his venting bag. He has been draining 24-34 ounces in the past 24 hours. He notes he tried metamucil and probiotic for his constipation. He is often on magnesium and supplements. After surgery he was starting to have a good loose BM when he was back home. He notes this has slowed on pain medication. He was able to have a BM today. For pain he is takes Oxycodone 2 tabs TID.   He notes his weight 224 pounds end of summer 2021 due to heavy biking and diet. He has gained 20 pounds since. His last biking event was across [place] for 6 days in 06/2020. He has been more sedentary in his off season. He continues to gain weight.   Socially he is married. He has 1 adult son. He has support from his brother Jenny Reichmann. 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 He does not smoke or drinks or do other recreational drugs. He notes he is overwhelmed with his cancer diagnosis. He has a PMHx of appendectomy and shoulder surgery. He is otherwise healthy. His sister had breast cancer in her 75s. Her brother had skin cancer and prostate cancer in 65s.     REVIEW OF SYSTEMS:    Constitutional: Denies fevers, chills or abnormal night sweats Eyes: Denies blurriness of vision, double vision or watery eyes Ears, nose, mouth, throat, and face: Denies mucositis or sore throat Respiratory: Denies cough, dyspnea or wheezes Cardiovascular: Denies palpitation, chest discomfort or lower extremity swelling Gastrointestinal:  Denies nausea, heartburn (+)  lower abdominal pain (+) improved constipation Skin: Denies abnormal skin rashes Lymphatics: Denies new lymphadenopathy or easy bruising Neurological:Denies numbness, tingling or new weaknesses Behavioral/Psych: Mood is stable, no new changes  All other systems were reviewed with the patient and are negative.   MEDICAL HISTORY:  Past Medical History:  Diagnosis Date  . Arthritis   . Family history of adverse reaction to anesthesia    mother had problem with it due to her asthma    SURGICAL HISTORY: Past Surgical History:  Procedure Laterality Date  . BOWEL RESECTION N/A 11/29/2020   Procedure: PARTIAL BOWEL RESECTION;  Surgeon: Jovita Kussmaul, MD;  Location: [place];  Service: General;  Laterality: N/A;  . GASTROSTOMY Left 11/29/2020   Procedure: INSERTION OF GASTROSTOMY TUBE;  Surgeon: Jovita Kussmaul, MD;  Location: [place];  Service: General;  Laterality: Left;  . LAPAROSCOPIC APPENDECTOMY N/A 01/17/2019   Procedure: APPENDECTOMY LAPAROSCOPIC;  Surgeon: Ralene Ok, MD;  Location: [place];  Service: General;  Laterality: N/A;  . LAPAROTOMY N/A 11/29/2020   Procedure: EXPLORATORY LAPAROTOMY;  Surgeon: Jovita Kussmaul, MD;  Location: [place];  Service: General;  Laterality: N/A;  PUT CASE IN ROOM 1 STARTING AT 9:30AM FOR 120 MIN  . OSTOMY N/A 11/29/2020   Procedure: POSSIBLE OSTOMY CREATION;  Surgeon: Jovita Kussmaul, MD;  Location: [place];  Service: General;  Laterality: N/A;  . RECTAL BIOPSY  N/A 11/29/2020   Procedure: PERITIONEAL BIOPSY;  Surgeon: Jovita Kussmaul, MD;  Location: [place];  Service: General;  Laterality: N/A;  . SHOULDER SURGERY Left 09/19/2015    SOCIAL HISTORY: Social History   Socioeconomic History  . Marital status: Single    Spouse name: Not on file  . Number of children: Not on file  . Years of education: Not on file  . Highest education level: Not on file  Occupational History  . Not on file  Tobacco Use  . Smoking status: Never Smoker  . Smokeless tobacco:  Never Used  Vaping Use  . Vaping Use: Never used  Substance and Sexual Activity  . Alcohol use: Not Currently    Comment: rare  . Drug use: Never  . Sexual activity: Not Currently  Other Topics Concern  . Not on file  Social History Narrative  . Not on file   Social Determinants of Health   Financial Resource Strain: Not on file  Food Insecurity: Not on file  Transportation Needs: Not on file  Physical Activity: Not on file  Stress: Not on file  Social Connections: Not on file  Intimate Partner Violence: Not on file    FAMILY HISTORY: Family History  Problem Relation Age of Onset  . Cancer Sister 48       breast cancer  . Cancer Brother 16       prostate cancer   . High Cholesterol Brother   . Colon cancer Neg Hx   . Pancreatic cancer Neg Hx   . Liver disease Neg Hx   . Esophageal cancer Neg Hx   . Stomach cancer Neg Hx     ALLERGIES:  has No Known Allergies.  MEDICATIONS:  Current Outpatient Medications  Medication Sig Dispense Refill  . dicyclomine (BENTYL) 10 MG capsule Take 1 capsule (10 mg total) by mouth 4 (four) times daily -  before meals and at bedtime. 15 capsule 0  . Oxycodone HCl 10 MG TABS Take 1 tablet (10 mg total) by mouth every 6 (six) hours as needed. 60 tablet 0  . prochlorperazine (COMPAZINE) 10 MG tablet Take 1 tablet (10 mg total) by mouth every 6 (six) hours as needed for nausea or vomiting. 30 tablet 0  . acetaminophen (TYLENOL) 325 MG tablet Take 650 mg by mouth every 6 (six) hours as needed for mild pain.    Marland Kitchen lidocaine-prilocaine (EMLA) cream Apply to affected area once 30 g 3  . ondansetron (ZOFRAN) 8 MG tablet Take 1 tablet (8 mg total) by mouth 2 (two) times daily as needed for refractory nausea / vomiting. Start on day 3 after chemotherapy. 30 tablet 1  . prochlorperazine (COMPAZINE) 10 MG tablet Take 1 tablet (10 mg total) by mouth every 6 (six) hours as needed (Nausea or vomiting). 30 tablet 1  . rosuvastatin (CRESTOR) 20 MG tablet  Take 1 tablet (20 mg total) by mouth daily. 90 tablet 3   No current facility-administered medications for this visit.    PHYSICAL EXAMINATION: ECOG PERFORMANCE STATUS: 2 - Symptomatic, <50% confined to bed  Vitals:   12/04/20 1440  BP: (!) 129/92  Pulse: 80  Resp: 20  Temp: (!) 97.2 F (36.2 C)  SpO2: 99%   Filed Weights   12/04/20 1440  Weight: 247 lb 3.2 oz (112.1 kg)    GENERAL:alert, no distress and comfortable SKIN: skin color, texture, turgor are normal, no rashes or significant lesions EYES: normal, Conjunctiva are pink and non-injected, sclera clear  NECK: supple, thyroid normal size, non-tender, without nodularity LYMPH:  no palpable lymphadenopathy in the cervical, axillary  LUNGS: clear to auscultation and percussion with normal breathing effort HEART: regular rate & rhythm and no murmurs and no lower extremity edema ABDOMEN:abdomen soft, non-tender and normal bowel sounds (+) Midline surgical incision healing, clean, no discharge, (+) Left abdominal G-tube is covered by gauze  Musculoskeletal:no cyanosis of digits and no clubbing  NEURO: alert & oriented x 3 with fluent speech, no focal motor/sensory deficits  LABORATORY DATA:  I have reviewed the data as listed CBC Latest Ref Rng & Units 12/01/2020 11/30/2020 11/29/2020  WBC 4.0 - 10.5 K/uL 7.5 11.2(H) 7.8  Hemoglobin 13.0 - 17.0 g/dL 12.6(L) 14.3 13.7  Hematocrit 39.0 - 52.0 % 36.3(L) 42.7 39.2  Platelets 150 - 400 K/uL 256 301 255    CMP Latest Ref Rng & Units 12/01/2020 11/29/2020 11/28/2020  Glucose 70 - 99 mg/dL 83 93 99  BUN 6 - 20 mg/dL '11 7 10  ' Creatinine 0.61 - 1.24 mg/dL 1.13 1.08 1.26(H)  Sodium 135 - 145 mmol/L 138 135 138  Potassium 3.5 - 5.1 mmol/L 3.6 3.7 4.0  Chloride 98 - 111 mmol/L 104 102 103  CO2 22 - 32 mmol/L '24 26 26  ' Calcium 8.9 - 10.3 mg/dL 8.1(L) 8.7(L) 8.6(L)  Total Protein 6.5 - 8.1 g/dL - - -  Total Bilirubin 0.3 - 1.2 mg/dL - - -  Alkaline Phos 38 - 126 U/L - - -  AST 15 - 41  U/L - - -  ALT 0 - 44 U/L - - -     RADIOGRAPHIC STUDIES: I have personally reviewed the radiological images as listed and agreed with the findings in the report. DG Abdomen 1 View  Result Date: 11/27/2020 CLINICAL DATA:  Nasogastric tube placement EXAM: ABDOMEN - 1 VIEW COMPARISON:  CT from earlier today FINDINGS: Enteric tube with tip and side-port over the stomach. Known small bowel obstruction. Mild atelectasis at the right base. IMPRESSION: Enteric tube with tip in the mid stomach. Electronically Signed   By: Monte Fantasia M.D.   On: 11/27/2020 07:44   CT ABDOMEN PELVIS W CONTRAST  Result Date: 11/28/2020 CLINICAL DATA:  Bowel obstruction suspected EXAM: CT ABDOMEN AND PELVIS WITH CONTRAST TECHNIQUE: Multidetector CT imaging of the abdomen and pelvis was performed using the standard protocol following bolus administration of intravenous contrast. CONTRAST:  159m OMNIPAQUE IOHEXOL 300 MG/ML SOLN, additional oral enteric contrast COMPARISON:  Abdominal radiographs, 11/27/2020, CT chest abdomen pelvis angiogram, 11/27/2020 CT abdomen pelvis, 01/17/2019 FINDINGS: Lower chest: Atelectasis or consolidation of the dependent bilateral lung bases, new compared to prior examination. Hepatobiliary: No solid liver abnormality is seen. No gallstones, gallbladder wall thickening, or biliary dilatation. Pancreas: Unremarkable. No pancreatic ductal dilatation or surrounding inflammatory changes. Spleen: Normal in size without significant abnormality. Adrenals/Urinary Tract: Adrenal glands are unremarkable. Kidneys are normal, without renal calculi, solid lesion, or hydronephrosis. Bladder is unremarkable. Stomach/Bowel: Esophagogastric tube with tip and side port below the diaphragm. Stomach is within normal limits. Appendix is surgically absent. The small bowel is generally decompressed, with full some fluid-filled, nondistended loops throughout. There is transit of oral enteric contrast to the terminal ileum.  There is masslike, circumferential thickening of the terminal ileum and cecal base near the ileocecal valve (series 8, image 82, series 5, image 57). Sigmoid diverticulosis. Vascular/Lymphatic: Aortic atherosclerosis. There are abnormally enlarged lymph nodes in the right lower quadrant mesentery adjacent to the terminal ileum measuring up to  2.3 x 1.4 cm (series 5, image 52). Reproductive: No mass or other significant abnormality. Other: Small, fat containing left inguinal hernia. There is small volume perihepatic and perisplenic ascites. There is extensive omental and peritoneal nodularity and caking (series 5, image 38, 72). Musculoskeletal: No acute or significant osseous findings. IMPRESSION: 1. There is masslike, circumferential thickening of the terminal ileum and cecal base near the ileocecal valve and abnormally enlarged lymph nodes in the right lower quadrant mesentery adjacent to the terminal ileum measuring up to 2.3 x 1.4 cm. 2. There is extensive omental and peritoneal nodularity and caking throughout the abdomen. 3. Findings are highly concerning for primary colon malignancy with nodal and peritoneal metastatic disease. 4. Small volume perihepatic and perisplenic ascites. 5. The small bowel is generally decompressed, with full some fluid-filled, nondistended loops throughout. There is transit of oral enteric contrast to the terminal ileum. No evidence of overt bowel obstruction at this time. Esophagogastric tube is position with tip and side port below the diaphragm. 6. Atelectasis or consolidation of the dependent bilateral lung bases, new compared to prior examination. Aortic Atherosclerosis (ICD10-I70.0). Electronically Signed   By: Eddie Candle M.D.   On: 11/28/2020 11:08   CT Angio Chest/Abd/Pel for Dissection W and/or Wo Contrast  Result Date: 11/27/2020 CLINICAL DATA:  Abdominal aortic dissection suspected. Presenting with abdominal pain. EXAM: CT ANGIOGRAPHY CHEST, ABDOMEN AND PELVIS  TECHNIQUE: Non-contrast CT of the chest was initially obtained. Multidetector CT imaging through the chest, abdomen and pelvis was performed using the standard protocol during bolus administration of intravenous contrast. Multiplanar reconstructed images and MIPs were obtained and reviewed to evaluate the vascular anatomy. CONTRAST:  154m OMNIPAQUE IOHEXOL 350 MG/ML SOLN COMPARISON:  CT abdomen 01/17/2019. FINDINGS: CTA CHEST FINDINGS Cardiovascular: Preferential opacification of the thoracic aorta. No evidence of thoracic aortic aneurysm or dissection. Mild atherosclerotic plaque of the thoracic aorta. Normal heart size. No significant pericardial effusion. At least mild four-vessel coronary artery calcifications. The main pulmonary artery is normal in caliber. Mediastinum/Nodes: No enlarged mediastinal, hilar, or axillary lymph nodes. Thyroid gland, trachea, and esophagus demonstrate no significant findings. Lungs/Pleura: Bilateral lower lobe subsegmental atelectasis. Linear atelectasis within the lingula. Subpleural right micronodule that appears to be triangular in morphology likely represents an intrapulmonary lymph node (7:75). No pulmonary mass. No focal consolidation. No pleural effusion. No pneumothorax. Musculoskeletal: No chest wall abnormality. No suspicious lytic or blastic osseous lesions. No acute displaced fracture. CTA ABDOMEN AND PELVIS FINDINGS VASCULAR Aorta: Mild atherosclerotic plaque. Normal caliber aorta without aneurysm, dissection, vasculitis or significant stenosis. Celiac: Patent without evidence of aneurysm, dissection, vasculitis or significant stenosis. SMA: Patent without evidence of aneurysm, dissection, vasculitis or significant stenosis. Renals: Both renal arteries are patent without evidence of aneurysm, dissection, vasculitis, fibromuscular dysplasia or significant stenosis. IMA: Patent without evidence of aneurysm, dissection, vasculitis or significant stenosis. Inflow: Mild  atherosclerotic plaque. Patent without evidence of aneurysm, dissection, vasculitis or significant stenosis. Veins: No obvious venous abnormality within the limitations of this arterial phase study. NON-VASCULAR Hepatobiliary: An 8 mm hypodensity within the right hepatic lobe is similar to prior likely represents a hepatic hemangioma. No new focal liver abnormality. No gallstones, gallbladder wall thickening, or pericholecystic fluid. No biliary dilatation. Pancreas: No focal lesion. Normal pancreatic contour. No surrounding inflammatory changes. No main pancreatic ductal dilatation. Spleen: Normal in size without focal abnormality. Adrenals/Urinary Tract: No adrenal nodule bilaterally. Bilateral kidneys enhance symmetrically. No hydronephrosis. No hydroureter. The urinary bladder is unremarkable. Stomach/Bowel: Stomach is within normal limits. Thickening  of the terminal ileum. Multiple loops of mid to distal small bowel within the anterior mid and lower abdomen are distended with fluid with some air-fluid levels noted. Surrounding mesenteric edema is noted. No evidence of large bowel wall thickening or dilatation. Scattered colonic diverticulosis. No pneumatosis. Status post appendectomy. Lymphatic: Right lower quadrant lymphadenopathy with as an example a 1.3 cm lymph node (6:197). Reproductive: Prostate is unremarkable. Other: Trace simple free fluid within the lower abdomen and pelvis. No intraperitoneal free gas. No organized fluid collection. Musculoskeletal: No abdominal wall hernia or abnormality. No suspicious lytic or blastic osseous lesions. No acute displaced fracture. Review of the MIP images confirms the above findings. IMPRESSION: 1. Thickening of the terminal ileum with associated proximal mid to distal small bowel obstruction. Findings could be due to an ileitis versus malignancy. Nonspecific mesenteric edema could be due to engorgement versus metastases. No associated bowel perforation. Recommend  endoscopy for further evaluation. 2. Indeterminate right lower quadrant lymphadenopathy. 3. Scattered colonic diverticulosis with no acute diverticulitis. 4. Stable right hepatic lobe subcentimeter hyperdensity likely represents a hepatic hemangioma. 5. No acute vascular abnormality. Aortic Atherosclerosis (ICD10-I70.0) - mild. 6. No acute intrathoracic abnormality. Electronically Signed   By: Iven Finn M.D.   On: 11/27/2020 03:07    ASSESSMENT & PLAN:  Ricardo [name] is a 55 y.o. Caucasian male with no significant medical history.    1. 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  -After 8-9 weeks of intermittent but increasing lower abdominal pain, bloating and constipation he was admitted to hospital on 11/27/20. Work up showed mass in the terminal ileum with extensive omental nodularity and LN involvement. His baseline tumor marker CA 19-9 was normal.  -His exploratory laparotomy surgery with Dr [name] on 11/29/20. He was found to have diffuse peritoneal metastasis and omental biopsy confirmed poorly differentiated adenocarcinoma with focal signet ring cell features and IHC studies support low GI primary.  -I discussed this confirms stage IV metastatic cancer from cecum. This is unlikely curable disease due to his very higher tumor burden. I recommend systemic therapy with chemo. I discussed the role of HIPEC for debulking, but he may not be a candidate for HIPEC surgery due to the high tumor burden.  -I recommend chemotherapy first to control his disease and reduce his tumor burden. If he responds very well he may be eligible for HIPEC surgery. This can potentially cure his disease, but will still have high risk of recurrence.  -I discussed the chemotherapy regimens such as FOLFOX or FOLFIRI or CAPOX. I recommend  starting with FOLFOX q2weeks, starting in about 2 weeks when his surgical incision heals.   -Chemotherapy consent: Side effects including but does not limited to, fatigue, nausea, vomiting, diarrhea, hair loss, neuropathy, fluid retention, renal and kidney dysfunction, neutropenic fever, needed for blood transfusion, bleeding, were discussed with patient in great detail. He agrees to proceed. -the goal of therapy is palliative to prolong his life and improve his quality of life  -I will request genomic testing with [facility] to see if he is eligible for target or immunotherapy.  -F/u on 4/4 before first cycle chemo   2. Symptom Management: Constipation, lower abdominal pain, abdominal bloating secondary #1 and partial bowl obstruction  -For 8-9 weeks before hospitalization he was having increasing lower abdominal pain and constipation -He  did have ostomy placed with surgery on 11/29/20. He drains with bag as needed.  -For pain since surgery he is on Oxycodone 24m q6hours. He has been taking 116mTID. I will increase dose to 1028m6hours (12/04/20). If not enough may change to long acting.  -I will call in Bentyl q6-8hours for his abdominal cramps so he can use less oxycodone (12/04/20) -Since hospital discharge he has been able to have adequate BM. I discussed avoiding constipation given this can lead to obstruction due to his tumor. He should have adequate BM every 1-2 days.  -I recommend Low residual diet with smaller, more frequents meals. I will refer him to dietician. I also recommend Ensure for protein.  -I do encourage him to be as active with walking and biking that is not too strenuous.    3. Genetic testing  -He has brother with prostate cancer and sister with breast cancer in their 50s52se is eligible for genetic testing. He is interested. Will refer him    4. Social Support -He is an avid cyclist. Last long distant riding was 06/2020 [place]. He has not biked in the 8-9 weeks  before cancer diagnosis due to symptoms. His weight heavily fluctuates with changes in his activity level, diet and biking.  -He is married with 1 adult son. His brother JohJenny Reichmann in town. 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  -I offered cone resources including chaplin and SW. He notes he has good family support and support from his chaplin.  -both pt and his wife are very overwhelmed with the cancer diagnosis and overall poor prognosis, I will refer him to SW for counseling    PLAN:  -I called in compazine, Oxycodone 80m51md Bentyl today  -Send genetic referral  -Send dietician referral and SW referral  -PAC placement in 1-2 weeks  -Chemo education class in 1-2 weeks  -F/u on 4/4 -Lab, flush and FOLFOX on 4/6 or 4/7  -I sent a note to Dr. TothMarlou Starkssee if he feels OK to let pt start chemo in about 2 weeks from surgical standpoint    Orders Placed This Encounter  Procedures  . Ambulatory referral to Social Work    Referral Priority:   Routine    Referral Type:   Consultation    Referral Reason:   Specialty Services Required    Number of Visits Requested:   1  . [facility]    Referral Priority:   Urgent    Referral Type:   Consultation    Referral Reason:   Specialty Services Required    Requested Specialty:   Oncology    Number of Visits Requested:   1  . Ambulatory referral to Social Work    Referral Priority:   Urgent    Referral Type:   Consultation    Referral Reason:   Specialty Services Required    Number of Visits Requested:   1    All questions were answered. The patient knows to call the clinic with any problems, questions or concerns. The total time spent in the appointment was 65 minutes.      Suhayla Chisom [name] 12/04/2020 5:12 PM  I, AmoyJoslyn Devon acting as scribe for Torrell Krutz [name].   I have reviewed the above documentation for accuracy and completeness, and  I agree with the above.

## 2020-12-05 ENCOUNTER — Telehealth: Payer: Self-pay | Admitting: Dietician

## 2020-12-05 ENCOUNTER — Other Ambulatory Visit: Payer: Self-pay

## 2020-12-05 ENCOUNTER — Telehealth: Payer: Self-pay | Admitting: Hematology

## 2020-12-05 ENCOUNTER — Encounter: Payer: Self-pay | Admitting: General Practice

## 2020-12-05 DIAGNOSIS — C772 Secondary and unspecified malignant neoplasm of intra-abdominal lymph nodes: Secondary | ICD-10-CM

## 2020-12-05 DIAGNOSIS — C182 Malignant neoplasm of ascending colon: Secondary | ICD-10-CM

## 2020-12-05 LAB — SURGICAL PATHOLOGY

## 2020-12-05 NOTE — Progress Notes (Signed)
[place] Psychosocial Distress Screening Clinical Social Work  Clinical Social Work was referred by distress screening protocol.  The patient scored a 8 on the Psychosocial Distress Thermometer which indicates moderate distress. Clinical Social Worker contacted patient by phone to assess for distress and other psychosocial needs. Patient reports he has determination to do whatever it takes to "beat this."  Relies on strong faith, family and friend support.  Is taking weekend to absorb information about his diagnosis, which came as a complete surprise to him.  He is otherwise healthy and very physically active - his goal is to be able to return to his cycling post treatment.  No needs at this time, provided overview of [place] and he will call us as needed throughout his journey.    [facility] DISTRESS SCREENING 12/04/2020  Screening Type Initial Screening  Distress experienced in past week (1-10) 8  Emotional problem type Adjusting to illness  Information Concerns Type Lack of info about diagnosis;Lack of info about treatment  Physical Problem type Pain;Nausea/vomiting;Sleep/insomnia  Physician notified of physical symptoms Yes  Referral to clinical psychology No  Referral to clinical social work Yes  Referral to dietition Yes  Referral to financial advocate No  Referral to support programs No  Referral to palliative care No    Clinical Social Worker follow up needed: No.  If yes, follow up plan:  Beverely Pace, [place], LCSW Clinical Social Worker Phone:  [phone]

## 2020-12-05 NOTE — Telephone Encounter (Signed)
Scheduled nutrition per sch msg. Called and left msg.

## 2020-12-05 NOTE — Progress Notes (Signed)
Met with patient and his wife Ricardo [name] today at his initial medical oncology consult with Dr. [name].  I explained my role as nurse navigator.  They were given my direct contact information and encouraged to call with any questions or concerns. Both of them are quite overwhelmed with his recent diagnosis.  A referral has been made to CSW and nutrition as well.  Dr. [name] has been contacted regarding placing a port a cath as soon as possible to begin treatment.

## 2020-12-05 NOTE — Telephone Encounter (Signed)
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 Unable to contact patient via telephone this afternoon. Contact information with request for call back left on voicemail. Patient has nutrition appointment scheduled on 12/10/20.

## 2020-12-06 ENCOUNTER — Telehealth: Payer: Self-pay | Admitting: Hematology

## 2020-12-06 NOTE — Telephone Encounter (Signed)
Scheduled per sch msg. Called and left msg  

## 2020-12-09 ENCOUNTER — Other Ambulatory Visit: Payer: Self-pay | Admitting: Hematology

## 2020-12-09 ENCOUNTER — Telehealth: Payer: Self-pay | Admitting: Hematology

## 2020-12-09 DIAGNOSIS — C182 Malignant neoplasm of ascending colon: Secondary | ICD-10-CM

## 2020-12-09 DIAGNOSIS — C772 Secondary and unspecified malignant neoplasm of intra-abdominal lymph nodes: Secondary | ICD-10-CM

## 2020-12-09 NOTE — Telephone Encounter (Signed)
Scheduled follow-up appointments per 3/23 los. Patient is aware.

## 2020-12-10 ENCOUNTER — Encounter: Payer: BC Managed Care – PPO | Admitting: Nutrition

## 2020-12-10 ENCOUNTER — Inpatient Hospital Stay: Payer: BC Managed Care – PPO | Admitting: Dietician

## 2020-12-10 NOTE — Progress Notes (Signed)
Left message for IR scheduler requesting port placement this week to start treatment next week.  I left my direct call back number.

## 2020-12-10 NOTE — Progress Notes (Signed)
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 He is s/p partial bowel resection with ostomy and G-tube (for venting) placement on 11/29/20 with plans to start palliative chemotherapy on 12/18/20. He is followed by Dr. Burr Medico.  Past medical history of arthritis.  Nutrition assessment completed with patient via telephone. He reports having some nausea this morning which is new for him. He had 2 pieces of toast and Pedialyte, reports nausea has improved. Patient relates this to sleeping on a new mattress after sleeping in a recliner over the past few weeks. Patient reports he is hungry most of the time, trying to mindful of what he is eating. He is avoiding red meat, recalls chicken, soups, spaghetti with [place] meat, Ensure, instant tea, cranberry juice mixed ginger ale, 2 bottles of water. Yesterday he had scrambled eggs, toast for breakfast, chocolate pudding, penne pasta with chicken for dinner, and a small bowl of ice cream. He drank 2 bottles of water, 2 Ensure Original (220 kcal, 9 g protein), 4 oz coke, and 2 bottles of instant tea. He is having more regular bowel movements, reports 2 yesterday. Patient is an avid cyclist, he is missing being able to ride currently. He reports once his stitches are removed, the doctor has cleared him to ride 5-10 miles/day and is very much looking forward to this. He continues to be active, walks regularly and reports attending a cancer benefit at [facility] from 7am-4pm on Saturday, says he did not sit down once.    Nutrition Focused Physical Exam: unable to complete at this time   Medications: tylenol, bentyl, zofran, oxycodone, compazine   Labs: 3/20 Hgb 12.6, Ca 8.1   Anthropometrics:   Height: 5'11" Weight: 247 lb 3.2 oz (3/23) UBW: 245-253 lb (last 12 months) BMI: 34.48   NUTRITION DIAGNOSIS: Food and nutrition knowledge deficit  related to colon cancer as evidenced by no prior indication for low residue diet.    INTERVENTION:  Educated on small frequent meals/snacks and the importance of adequate fluid intake  Discussed low-fiber foods as well as good sources of lean protein Discussed types of oral nutrition supplements, recommended switching to nutrient dense supplement, and continue to drink twice daily Encouraged continued activity as able per MD recommendations, informed patient about affiliated programs offered through cancer center (support groups, yoga, [place] chi, etc.) Fact sheets, program information, RD contact mailed to patient today   MONITORING, EVALUATION, GOAL: Patient will maintain adequate calories and protein to minimize weight loss during treatment   Next Visit: To be scheduled with infusion

## 2020-12-10 NOTE — Progress Notes (Signed)
Left message for [name] at [facility] IR requesting patient be scheduled for port placement this week to start treatment next week.  I left my direct call back number.

## 2020-12-11 ENCOUNTER — Telehealth: Payer: Self-pay | Admitting: Nurse Practitioner

## 2020-12-11 ENCOUNTER — Other Ambulatory Visit: Payer: Self-pay | Admitting: Student

## 2020-12-11 ENCOUNTER — Other Ambulatory Visit: Payer: Self-pay

## 2020-12-11 NOTE — Progress Notes (Signed)
The proposed treatment discussed in conference is for discussion purposes only and is not a binding recommendation.  The patients have not been physically examined, or presented with their treatment options.  Therefore, final treatment plans cannot be decided.   

## 2020-12-11 NOTE — Telephone Encounter (Signed)
Scheduled appt per 3/30 sch msg. Called pt, no answer. Left msg with appt date and time.  

## 2020-12-11 NOTE — Progress Notes (Signed)
Pharmacist Chemotherapy Monitoring - Initial Assessment    Anticipated start date: 12/18/2020   Regimen:  . Are orders appropriate based on the patient's diagnosis, regimen, and cycle? Yes . Does the plan date match the patient's scheduled date? Yes . Is the sequencing of drugs appropriate? Yes . Are the premedications appropriate for the patient's regimen? Yes . Prior Authorization for treatment is: Not Started o If applicable, is the correct biosimilar selected based on the patient's insurance? not applicable  Organ Function and Labs: Marland Kitchen Are dose adjustments needed based on the patient's renal function, hepatic function, or hematologic function? Yes . Are appropriate labs ordered prior to the start of patient's treatment? Yes . Other organ system assessment, if indicated: N/A . The following baseline labs, if indicated, have been ordered: N/A  Dose Assessment: . Are the drug doses appropriate? Yes . Are the following correct: o Drug concentrations Yes o IV fluid compatible with drug Yes o Administration routes Yes o Timing of therapy Yes . If applicable, does the patient have documented access for treatment and/or plans for port-a-cath placement? yes . If applicable, have lifetime cumulative doses been properly documented and assessed? not applicable Lifetime Dose Tracking  No doses have been documented on this patient for the following tracked chemicals: Doxorubicin, Epirubicin, Idarubicin, Daunorubicin, Mitoxantrone, Bleomycin, Oxaliplatin, Carboplatin, Liposomal Doxorubicin  o   Toxicity Monitoring/Prevention: . The patient has the following take home antiemetics prescribed: Ondansetron, Prochlorperazine and Dexamethasone . The patient has the following take home medications prescribed: N/A . Medication allergies and previous infusion related reactions, if applicable, have been reviewed and addressed. No . The patient's current medication list has been assessed for drug-drug  interactions with their chemotherapy regimen. no significant drug-drug interactions were identified on review.  Order Review: . Are the treatment plan orders signed? Yes . Is the patient scheduled to see a provider prior to their treatment? Yes  I verify that I have reviewed each item in the above checklist and answered each question accordingly.  Larene Beach, RPH, 12/11/2020  9:12 AM

## 2020-12-12 ENCOUNTER — Encounter: Payer: BC Managed Care – PPO | Admitting: Genetic Counselor

## 2020-12-12 ENCOUNTER — Other Ambulatory Visit: Payer: Self-pay | Admitting: Hematology

## 2020-12-12 ENCOUNTER — Other Ambulatory Visit: Payer: Self-pay

## 2020-12-12 ENCOUNTER — Ambulatory Visit (HOSPITAL_COMMUNITY)
Admission: RE | Admit: 2020-12-12 | Discharge: 2020-12-12 | Disposition: A | Discharge status: Home / Self Care | Payer: BC Managed Care – PPO | Source: Ambulatory Visit | Attending: Hematology | Admitting: Hematology

## 2020-12-12 DIAGNOSIS — C189 Malignant neoplasm of colon, unspecified: Secondary | ICD-10-CM | POA: Diagnosis not present

## 2020-12-12 DIAGNOSIS — C772 Secondary and unspecified malignant neoplasm of intra-abdominal lymph nodes: Secondary | ICD-10-CM

## 2020-12-12 DIAGNOSIS — C182 Malignant neoplasm of ascending colon: Secondary | ICD-10-CM

## 2020-12-12 HISTORY — PX: IR IMAGING GUIDED PORT INSERTION: IMG5740

## 2020-12-12 MED ORDER — HEPARIN SOD (PORK) LOCK FLUSH 100 UNIT/ML IV SOLN
INTRAVENOUS | Status: AC | PRN
  Administered 2020-12-12: 500 [IU] via INTRAVENOUS

## 2020-12-12 MED ORDER — FENTANYL CITRATE (PF) 100 MCG/2ML IJ SOLN
INTRAMUSCULAR | Status: AC | PRN
  Administered 2020-12-12 (×3): 25 ug via INTRAVENOUS

## 2020-12-12 MED ORDER — MIDAZOLAM HCL 2 MG/2ML IJ SOLN
INTRAMUSCULAR | Status: AC
  Filled 2020-12-12: qty 4

## 2020-12-12 MED ORDER — HEPARIN SOD (PORK) LOCK FLUSH 100 UNIT/ML IV SOLN
INTRAVENOUS | Status: AC
  Filled 2020-12-12: qty 5

## 2020-12-12 MED ORDER — LIDOCAINE-EPINEPHRINE 1 %-1:100000 IJ SOLN
INTRAMUSCULAR | Status: AC
  Filled 2020-12-12: qty 1

## 2020-12-12 MED ORDER — SODIUM CHLORIDE 0.9 % IV SOLN: INTRAVENOUS | Status: DC

## 2020-12-12 MED ORDER — CEFAZOLIN SODIUM-DEXTROSE 2-4 GM/100ML-% IV SOLN
INTRAVENOUS | Status: AC
  Filled 2020-12-12: qty 100

## 2020-12-12 MED ORDER — MIDAZOLAM HCL 2 MG/2ML IJ SOLN
INTRAMUSCULAR | Status: AC | PRN
  Administered 2020-12-12 (×2): 1 mg via INTRAVENOUS

## 2020-12-12 MED ORDER — CEFAZOLIN SODIUM-DEXTROSE 2-4 GM/100ML-% IV SOLN
INTRAVENOUS | Status: AC | PRN
  Administered 2020-12-12: 2 g via INTRAVENOUS

## 2020-12-12 MED ORDER — LIDOCAINE-EPINEPHRINE 1 %-1:100000 IJ SOLN
INTRAMUSCULAR | Status: AC | PRN
  Administered 2020-12-12: 5 mL

## 2020-12-12 MED ORDER — FENTANYL CITRATE (PF) 100 MCG/2ML IJ SOLN
INTRAMUSCULAR | Status: AC
  Filled 2020-12-12: qty 4

## 2020-12-12 NOTE — Procedures (Signed)
Interventional Radiology Procedure Note  Procedure: RT IJ POWER PORT    Complications: None  Estimated Blood Loss:  MIN  Findings: TIP SVCRA    M. TREVOR [name], MD    

## 2020-12-12 NOTE — Sedation Documentation (Signed)
Patient unable to return to Short Stay Unit for recovery. Patient transported to Radiology Nurse's Station for monitoring and recovery.

## 2020-12-12 NOTE — Consult Note (Signed)
Chief Complaint: Patient was seen in consultation today for Port-A-Cath placement  Referring Physician(s): Feng,Yan  Supervising Physician: Daryll Brod  Patient Status: [facility] - Out-pt  History of Present Illness: Ricardo [name] is a 55 y.o. male with history of newly diagnosed metastatic colon cancer, status post exploratory laparotomy, partial bowel resection, peritoneal biopsy/partial omentectomy, insertion of G-tube on 11/29/2020.  He presents today for Port-A-Cath placement for chemotherapy.  Past Medical History:  Diagnosis Date  . Arthritis   . Family history of adverse reaction to anesthesia    mother had problem with it due to her asthma    Past Surgical History:  Procedure Laterality Date  . BOWEL RESECTION N/A 11/29/2020   Procedure: PARTIAL BOWEL RESECTION;  Surgeon: Jovita Kussmaul, MD;  Location: [place];  Service: General;  Laterality: N/A;  . GASTROSTOMY Left 11/29/2020   Procedure: INSERTION OF GASTROSTOMY TUBE;  Surgeon: Jovita Kussmaul, MD;  Location: [place];  Service: General;  Laterality: Left;  . LAPAROSCOPIC APPENDECTOMY N/A 01/17/2019   Procedure: APPENDECTOMY LAPAROSCOPIC;  Surgeon: Ralene Ok, MD;  Location: [place];  Service: General;  Laterality: N/A;  . LAPAROTOMY N/A 11/29/2020   Procedure: EXPLORATORY LAPAROTOMY;  Surgeon: Jovita Kussmaul, MD;  Location: [place];  Service: General;  Laterality: N/A;  PUT CASE IN ROOM 1 STARTING AT 9:30AM FOR 120 MIN  . OSTOMY N/A 11/29/2020   Procedure: POSSIBLE OSTOMY CREATION;  Surgeon: Jovita Kussmaul, MD;  Location: [place];  Service: General;  Laterality: N/A;  . RECTAL BIOPSY N/A 11/29/2020   Procedure: PERITIONEAL BIOPSY;  Surgeon: Jovita Kussmaul, MD;  Location: [place];  Service: General;  Laterality: N/A;  . SHOULDER SURGERY Left 09/19/2015    Allergies: Patient has no known allergies.  Medications: Prior to Admission medications   Medication Sig Start Date End Date Taking? Authorizing Provider  acetaminophen  (TYLENOL) 500 MG tablet Take 1,000 mg by mouth every 6 (six) hours as needed for mild pain.   Yes [provider]  ondansetron (ZOFRAN) 8 MG tablet Take 1 tablet (8 mg total) by mouth 2 (two) times daily as needed for refractory nausea / vomiting. Start on day 3 after chemotherapy. 12/04/20  Yes [name], MD  Oxycodone HCl 10 MG TABS Take 1 tablet (10 mg total) by mouth every 6 (six) hours as needed. Patient taking differently: Take 10 mg by mouth every 6 (six) hours as needed (pain). 12/04/20  Yes [name], MD  prochlorperazine (COMPAZINE) 10 MG tablet Take 1 tablet (10 mg total) by mouth every 6 (six) hours as needed for nausea or vomiting. 12/04/20  Yes [name], MD  dicyclomine (BENTYL) 10 MG capsule Take 1 capsule (10 mg total) by mouth 4 (four) times daily -  before meals and at bedtime. Patient not taking: No sig reported 12/04/20   [name], MD  lidocaine-prilocaine (EMLA) cream Apply to affected area once Patient taking differently: Apply 1 application topically daily as needed. Apply to affected area once 12/04/20   [name], MD  prochlorperazine (COMPAZINE) 10 MG tablet Take 1 tablet (10 mg total) by mouth every 6 (six) hours as needed (Nausea or vomiting). Patient not taking: Reported on 12/11/2020 12/04/20   [name], MD  rosuvastatin (CRESTOR) 20 MG tablet Take 1 tablet (20 mg total) by mouth daily. Patient not taking: Reported on 12/11/2020 09/12/20   [name], MD     Family History  Problem Relation Age of Onset  . Cancer  Sister 17       breast cancer  . Cancer Brother 59       prostate cancer   . High Cholesterol Brother   . Colon cancer Neg Hx   . Pancreatic cancer Neg Hx   . Liver disease Neg Hx   . Esophageal cancer Neg Hx   . Stomach cancer Neg Hx     Social History   Socioeconomic History  . Marital status: Single    Spouse name: Not on file  . Number of children: Not on file  . Years of education: Not on file  . Highest education level:  Not on file  Occupational History  . Not on file  Tobacco Use  . Smoking status: Never Smoker  . Smokeless tobacco: Never Used  Vaping Use  . Vaping Use: Never used  Substance and Sexual Activity  . Alcohol use: Not Currently    Comment: rare  . Drug use: Never  . Sexual activity: Not Currently  Other Topics Concern  . Not on file  Social History Narrative  . Not on file   Social Determinants of Health   Financial Resource Strain: Not on file  Food Insecurity: Not on file  Transportation Needs: Not on file  Physical Activity: Not on file  Stress: Not on file  Social Connections: Not on file      Review of Systems currently denies fever, headache, chest pain, dyspnea, cough, back pain, vomiting or bleeding.  He does have abdominal pain, bloating and occasional nausea as well as some constipation.  Vital Signs: BP (!) 145/99   Pulse 79   Temp 98 F (36.7 C) (Oral)   Resp (!) 22   Ht 5\' 11"  (1.803 m)   Wt 245 lb (111.1 kg)   SpO2 99%   BMI 34.17 kg/m   Physical Exam awake, alert.  Chest clear to auscultation bilaterally.  Heart with regular rate and rhythm.  Abdomen soft, slightly distended, intact G-tube with insertion site okay.  Some mild diffuse tenderness to palpation.  No lower extremity edema.  Imaging: DG Abdomen 1 View  Result Date: 11/27/2020 CLINICAL DATA:  Nasogastric tube placement EXAM: ABDOMEN - 1 VIEW COMPARISON:  CT from earlier today FINDINGS: Enteric tube with tip and side-port over the stomach. Known small bowel obstruction. Mild atelectasis at the right base. IMPRESSION: Enteric tube with tip in the mid stomach. Electronically Signed   By: Monte Fantasia M.D.   On: 11/27/2020 07:44   CT ABDOMEN PELVIS W CONTRAST  Result Date: 11/28/2020 CLINICAL DATA:  Bowel obstruction suspected EXAM: CT ABDOMEN AND PELVIS WITH CONTRAST TECHNIQUE: Multidetector CT imaging of the abdomen and pelvis was performed using the standard protocol following bolus  administration of intravenous contrast. CONTRAST:  178mL OMNIPAQUE IOHEXOL 300 MG/ML SOLN, additional oral enteric contrast COMPARISON:  Abdominal radiographs, 11/27/2020, CT chest abdomen pelvis angiogram, 11/27/2020 CT abdomen pelvis, 01/17/2019 FINDINGS: Lower chest: Atelectasis or consolidation of the dependent bilateral lung bases, new compared to prior examination. Hepatobiliary: No solid liver abnormality is seen. No gallstones, gallbladder wall thickening, or biliary dilatation. Pancreas: Unremarkable. No pancreatic ductal dilatation or surrounding inflammatory changes. Spleen: Normal in size without significant abnormality. Adrenals/Urinary Tract: Adrenal glands are unremarkable. Kidneys are normal, without renal calculi, solid lesion, or hydronephrosis. Bladder is unremarkable. Stomach/Bowel: Esophagogastric tube with tip and side port below the diaphragm. Stomach is within normal limits. Appendix is surgically absent. The small bowel is generally decompressed, with full some fluid-filled, nondistended loops throughout.  There is transit of oral enteric contrast to the terminal ileum. There is masslike, circumferential thickening of the terminal ileum and cecal base near the ileocecal valve (series 8, image 82, series 5, image 57). Sigmoid diverticulosis. Vascular/Lymphatic: Aortic atherosclerosis. There are abnormally enlarged lymph nodes in the right lower quadrant mesentery adjacent to the terminal ileum measuring up to 2.3 x 1.4 cm (series 5, image 52). Reproductive: No mass or other significant abnormality. Other: Small, fat containing left inguinal hernia. There is small volume perihepatic and perisplenic ascites. There is extensive omental and peritoneal nodularity and caking (series 5, image 38, 72). Musculoskeletal: No acute or significant osseous findings. IMPRESSION: 1. There is masslike, circumferential thickening of the terminal ileum and cecal base near the ileocecal valve and abnormally  enlarged lymph nodes in the right lower quadrant mesentery adjacent to the terminal ileum measuring up to 2.3 x 1.4 cm. 2. There is extensive omental and peritoneal nodularity and caking throughout the abdomen. 3. Findings are highly concerning for primary colon malignancy with nodal and peritoneal metastatic disease. 4. Small volume perihepatic and perisplenic ascites. 5. The small bowel is generally decompressed, with full some fluid-filled, nondistended loops throughout. There is transit of oral enteric contrast to the terminal ileum. No evidence of overt bowel obstruction at this time. Esophagogastric tube is position with tip and side port below the diaphragm. 6. Atelectasis or consolidation of the dependent bilateral lung bases, new compared to prior examination. Aortic Atherosclerosis (ICD10-I70.0). Electronically Signed   By: Eddie Candle M.D.   On: 11/28/2020 11:08   CT Angio Chest/Abd/Pel for Dissection W and/or Wo Contrast  Result Date: 11/27/2020 CLINICAL DATA:  Abdominal aortic dissection suspected. Presenting with abdominal pain. EXAM: CT ANGIOGRAPHY CHEST, ABDOMEN AND PELVIS TECHNIQUE: Non-contrast CT of the chest was initially obtained. Multidetector CT imaging through the chest, abdomen and pelvis was performed using the standard protocol during bolus administration of intravenous contrast. Multiplanar reconstructed images and MIPs were obtained and reviewed to evaluate the vascular anatomy. CONTRAST:  187mL OMNIPAQUE IOHEXOL 350 MG/ML SOLN COMPARISON:  CT abdomen 01/17/2019. FINDINGS: CTA CHEST FINDINGS Cardiovascular: Preferential opacification of the thoracic aorta. No evidence of thoracic aortic aneurysm or dissection. Mild atherosclerotic plaque of the thoracic aorta. Normal heart size. No significant pericardial effusion. At least mild four-vessel coronary artery calcifications. The main pulmonary artery is normal in caliber. Mediastinum/Nodes: No enlarged mediastinal, hilar, or axillary  lymph nodes. Thyroid gland, trachea, and esophagus demonstrate no significant findings. Lungs/Pleura: Bilateral lower lobe subsegmental atelectasis. Linear atelectasis within the lingula. Subpleural right micronodule that appears to be triangular in morphology likely represents an intrapulmonary lymph node (7:75). No pulmonary mass. No focal consolidation. No pleural effusion. No pneumothorax. Musculoskeletal: No chest wall abnormality. No suspicious lytic or blastic osseous lesions. No acute displaced fracture. CTA ABDOMEN AND PELVIS FINDINGS VASCULAR Aorta: Mild atherosclerotic plaque. Normal caliber aorta without aneurysm, dissection, vasculitis or significant stenosis. Celiac: Patent without evidence of aneurysm, dissection, vasculitis or significant stenosis. SMA: Patent without evidence of aneurysm, dissection, vasculitis or significant stenosis. Renals: Both renal arteries are patent without evidence of aneurysm, dissection, vasculitis, fibromuscular dysplasia or significant stenosis. IMA: Patent without evidence of aneurysm, dissection, vasculitis or significant stenosis. Inflow: Mild atherosclerotic plaque. Patent without evidence of aneurysm, dissection, vasculitis or significant stenosis. Veins: No obvious venous abnormality within the limitations of this arterial phase study. NON-VASCULAR Hepatobiliary: An 8 mm hypodensity within the right hepatic lobe is similar to prior likely represents a hepatic hemangioma. No new focal liver abnormality.  No gallstones, gallbladder wall thickening, or pericholecystic fluid. No biliary dilatation. Pancreas: No focal lesion. Normal pancreatic contour. No surrounding inflammatory changes. No main pancreatic ductal dilatation. Spleen: Normal in size without focal abnormality. Adrenals/Urinary Tract: No adrenal nodule bilaterally. Bilateral kidneys enhance symmetrically. No hydronephrosis. No hydroureter. The urinary bladder is unremarkable. Stomach/Bowel: Stomach is  within normal limits. Thickening of the terminal ileum. Multiple loops of mid to distal small bowel within the anterior mid and lower abdomen are distended with fluid with some air-fluid levels noted. Surrounding mesenteric edema is noted. No evidence of large bowel wall thickening or dilatation. Scattered colonic diverticulosis. No pneumatosis. Status post appendectomy. Lymphatic: Right lower quadrant lymphadenopathy with as an example a 1.3 cm lymph node (6:197). Reproductive: Prostate is unremarkable. Other: Trace simple free fluid within the lower abdomen and pelvis. No intraperitoneal free gas. No organized fluid collection. Musculoskeletal: No abdominal wall hernia or abnormality. No suspicious lytic or blastic osseous lesions. No acute displaced fracture. Review of the MIP images confirms the above findings. IMPRESSION: 1. Thickening of the terminal ileum with associated proximal mid to distal small bowel obstruction. Findings could be due to an ileitis versus malignancy. Nonspecific mesenteric edema could be due to engorgement versus metastases. No associated bowel perforation. Recommend endoscopy for further evaluation. 2. Indeterminate right lower quadrant lymphadenopathy. 3. Scattered colonic diverticulosis with no acute diverticulitis. 4. Stable right hepatic lobe subcentimeter hyperdensity likely represents a hepatic hemangioma. 5. No acute vascular abnormality. Aortic Atherosclerosis (ICD10-I70.0) - mild. 6. No acute intrathoracic abnormality. Electronically Signed   By: Iven Finn M.D.   On: 11/27/2020 03:07    Labs:  CBC: Recent Labs    11/28/20 0158 11/29/20 0150 11/30/20 0903 12/01/20 0033  WBC 7.3 7.8 11.2* 7.5  HGB 14.0 13.7 14.3 12.6*  HCT 41.7 39.2 42.7 36.3*  PLT 273 255 301 256    COAGS: No results for input(s): INR, APTT in the last 8760 hours.  BMP: Recent Labs    01/31/20 1630 09/04/20 0911 09/04/20 0911 11/27/20 0022 11/27/20 0612 11/28/20 0158  11/29/20 0150 12/01/20 0033  NA 138 138  --  136  --  138 135 138  K 3.9 4.6  --  4.3  --  4.0 3.7 3.6  CL 100 103  --  103  --  103 102 104  CO2 22 20  --  25  --  26 26 24   GLUCOSE 92 87  --  112*  --  99 93 83  BUN 17 15  --  11  --  10 7 11   CALCIUM 9.4 9.6  --  9.5  --  8.6* 8.7* 8.1*  CREATININE 1.17 1.09  --  1.25* 1.16 1.26* 1.08 1.13  GFRNONAA 70 76   < > >60 >60 >60 >60 >60  GFRAA 81 88  --   --   --   --   --   --    < > = values in this interval not displayed.    LIVER FUNCTION TESTS: Recent Labs    01/31/20 1630 09/04/20 0911 11/27/20 0022  BILITOT 0.5 0.6 0.9  AST 18 21 22   ALT 19 20 21   ALKPHOS 81 104 85  PROT 6.9 7.3 8.0  ALBUMIN 4.4 4.6 4.3    TUMOR MARKERS: No results for input(s): AFPTM, CEA, CA199, CHROMGRNA in the last 8760 hours.  Assessment and Plan: 55 y.o. male with history of newly diagnosed metastatic colon cancer, status post exploratory laparotomy, partial bowel resection,  peritoneal biopsy/partial omentectomy, insertion of G-tube on 11/29/2020.  He presents today for Port-A-Cath placement for chemotherapy. Risks and benefits of image guided port-a-catheter placement was discussed with the patient including, but not limited to bleeding, infection, pneumothorax, or fibrin sheath development and need for additional procedures.  All of the patient's questions were answered, patient is agreeable to proceed. Consent signed and in chart.     Thank you for this interesting consult.  I greatly enjoyed meeting Ricardo [name] and look forward to participating in their care.  A copy of this report was sent to the requesting provider on this date.  Electronically Signed: D. Rowe Robert, PA-C 12/12/2020, 8:30 AM   I spent a total of  25 minutes   in face to face in clinical consultation, greater than 50% of which was counseling/coordinating care for Port-A-Cath placement

## 2020-12-12 NOTE — Progress Notes (Signed)
Recovery period completed after port placement procedure. Vital signs monitored, PIV removed. Provided patient with ginger ale to drink after procedure. Declined solid food, as stomach still rumbling and gassy. Gastrostomy tube remains to straight gravity drainage. Spoke with son, Ricardo [name] to review discharge instructions. Son, Ricardo [name] verbalized understanding of instructions. Patient verbalized understanding of lifting restrictions, no saturation of new port site in tub bath or spa, and no operating heavy machinery or signing legal documents due to receiving moderate sedation during procedure. Paper copy of AVS provided to patient and son. Ambulatory to lobby independently. Discharged to son's care. Left via private automobile. Discharged in stable condition.

## 2020-12-12 NOTE — Discharge Instructions (Addendum)
Implanted Port Insertion, Care After °This sheet gives you information about how to care for yourself after your procedure. Your health care provider may also give you more specific instructions. If you have problems or questions, contact your health care provider. °What can I expect after the procedure? °After the procedure, it is common to have: °· Discomfort at the port insertion site. °· Bruising on the skin over the port. This should improve over 3-4 days. °Follow these instructions at home: °Port care °· After your port is placed, you will get a manufacturer's information card. The card has information about your port. Keep this card with you at all times. °· Take care of the port as told by your health care provider. Ask your health care provider if you or a family member can get training for taking care of the port at home. A home health care nurse may also take care of the port. °· Make sure to remember what type of port you have. °Incision care °· Follow instructions from your health care provider about how to take care of your port insertion site. Make sure you: °? Wash your hands with soap and water before and after you change your bandage (dressing). If soap and water are not available, use hand sanitizer. °? Change your dressing as told by your health care provider. °? Leave stitches (sutures), skin glue, or adhesive strips in place. These skin closures may need to stay in place for 2 weeks or longer. If adhesive strip edges start to loosen and curl up, you may trim the loose edges. Do not remove adhesive strips completely unless your health care provider tells you to do that. °· Check your port insertion site every day for signs of infection. Check for: °? Redness, swelling, or pain. °? Fluid or blood. °? Warmth. °? Pus or a bad smell.  °  °  °Activity °· Return to your normal activities as told by your health care provider. Ask your health care provider what activities are safe for you. °· Do not  lift anything that is heavier than 10 lb (4.5 kg), or the limit that you are told, until your health care provider says that it is safe. °General instructions °· Take over-the-counter and prescription medicines only as told by your health care provider. °· Do not take baths, swim, or use a hot tub until your health care provider approves. Ask your health care provider if you may take showers. You may only be allowed to take sponge baths. °· Do not drive for 24 hours if you were given a sedative during your procedure. °· Wear a medical alert bracelet in case of an emergency. This will tell any health care providers that you have a port. °· Keep all follow-up visits as told by your health care provider. This is important. °Contact a health care provider if: °· You cannot flush your port with saline as directed, or you cannot draw blood from the port. °· You have a fever or chills. °· You have redness, swelling, or pain around your port insertion site. °· You have fluid or blood coming from your port insertion site. °· Your port insertion site feels warm to the touch. °· You have pus or a bad smell coming from the port insertion site. °Get help right away if: °· You have chest pain or shortness of breath. °· You have bleeding from your port that you cannot control. °Summary °· Take care of the port as told by your   health care provider. Keep the manufacturer's information card with you at all times. °· Change your dressing as told by your health care provider. °· Contact a health care provider if you have a fever or chills or if you have redness, swelling, or pain around your port insertion site. °· Keep all follow-up visits as told by your health care provider. °This information is not intended to replace advice given to you by your health care provider. Make sure you discuss any questions you have with your health care provider. °Document Revised: 03/29/2018 Document Reviewed: 03/29/2018 °Elsevier Patient Education ©  2021 [employer] Inc[employer] °Moderate Conscious Sedation, Adult °Sedation is the use of medicines to promote relaxation and to relieve discomfort and anxiety. Moderate conscious sedation is a type of sedation. Under moderate conscious sedation, you are less alert than normal, but you are still able to respond to instructions, touch, or both. °Moderate conscious sedation is used during short medical and dental procedures. It is milder than deep sedation, which is a type of sedation under which you cannot be easily woken up. It is also milder than general anesthesia, which is the use of medicines to make you unconscious. Moderate conscious sedation allows you to return to your regular activities sooner. °Tell a health care provider about: °· Any allergies you have. °· All medicines you are taking, including vitamins, herbs, eye drops, creams, and over-the-counter medicines. °· Any use of steroids. This includes steroids taken by mouth or as a cream. °· Any problems you or family members have had with sedatives and anesthetic medicines. °· Any blood disorders you have. °· Any surgeries you have had. °· Any medical conditions you have, such as sleep apnea. °· Whether you are pregnant or may be pregnant. °· Any use of cigarettes, alcohol, marijuana, or drugs. °What are the risks? °Generally, this is a safe procedure. However, problems may occur, including: °· Getting too much medicine (oversedation). °· Nausea. °· Allergic reaction to medicines. °· Trouble breathing. If this happens, a breathing tube may be used. It will be removed when you are awake and breathing on your own. °· Heart trouble. °· Lung trouble. °· Confusion that gets better with time (emergence delirium). °What happens before the procedure? °Staying hydrated °Follow instructions from your health care provider about hydration, which may include: °· Up to 2 hours before the procedure - you may continue to drink clear liquids, such as water, clear fruit juice, black  coffee, and plain tea. °Eating and drinking restrictions °Follow instructions from your health care provider about eating and drinking, which may include: °· 8 hours before the procedure - stop eating heavy meals or foods, such as meat, fried foods, or fatty foods. °· 6 hours before the procedure - stop eating light meals or foods, such as toast or cereal. °· 6 hours before the procedure - stop drinking milk or drinks that contain milk. °· 2 hours before the procedure - stop drinking clear liquids. °Medicines °Ask your health care provider about: °· Changing or stopping your regular medicines. This is especially important if you are taking diabetes medicines or blood thinners. °· Taking medicines such as aspirin and ibuprofen. These medicines can thin your blood. Do not take these medicines unless your health care provider tells you to take them. °· Taking over-the-counter medicines, vitamins, herbs, and supplements. °Tests and exams °· You will have a physical exam. °· You may have blood tests done to show how well: °? Your kidneys and liver work. °? Your   blood clots. °General instructions °· Plan to have a responsible adult take you home from the hospital or clinic. °· If you will be going home right after the procedure, plan to have a responsible adult care for you for the time you are told. This is important. °What happens during the procedure? °· You will be given the sedative. The sedative may be given: °? As a pill that you will swallow. It can also be inserted into the rectum. °? As a spray through the nose. °? As an injection into the muscle. °? As an injection into the vein through an IV. °· You may be given oxygen as needed. °· Your breathing, heart rate, and blood pressure will be monitored during the procedure. °· The medical or dental procedure will be done. °The procedure may vary among health care providers and hospitals.   °What happens after the procedure? °· Your blood pressure, heart rate,  breathing rate, and blood oxygen level will be monitored until you leave the hospital or clinic. °· You will get fluids through your IV if needed. °· Do not drive or operate machinery until your health care provider says that it is safe. °Summary °· Sedation is the use of medicines to promote relaxation and to relieve discomfort and anxiety. Moderate conscious sedation is a type of sedation that is used during short medical and dental procedures. °· Tell the health care provider about any medical conditions that you have and about all the medicines that you are taking. °· You will be given the sedative as a pill, a spray through the nose, an injection into the muscle, or an injection into the vein through an IV. Vital signs are monitored during the sedation. °· Moderate conscious sedation allows you to return to your regular activities sooner. °This information is not intended to replace advice given to you by your health care provider. Make sure you discuss any questions you have with your health care provider. °Document Revised: 12/29/2019 Document Reviewed: 07/27/2019 °Elsevier Patient Education © 2021 Elsevier Inc. ° °

## 2020-12-16 ENCOUNTER — Other Ambulatory Visit: Payer: Self-pay | Admitting: Nurse Practitioner

## 2020-12-16 ENCOUNTER — Inpatient Hospital Stay: Payer: BC Managed Care – PPO | Attending: Nurse Practitioner | Admitting: Nurse Practitioner

## 2020-12-16 ENCOUNTER — Encounter: Payer: Self-pay | Admitting: Nurse Practitioner

## 2020-12-16 ENCOUNTER — Other Ambulatory Visit: Payer: Self-pay

## 2020-12-16 ENCOUNTER — Inpatient Hospital Stay: Payer: BC Managed Care – PPO

## 2020-12-16 VITALS — BP 125/92 | HR 98 | Temp 97.2°F | Resp 19 | Ht 71.0 in | Wt 234.6 lb

## 2020-12-16 DIAGNOSIS — C772 Secondary and unspecified malignant neoplasm of intra-abdominal lymph nodes: Secondary | ICD-10-CM | POA: Diagnosis not present

## 2020-12-16 DIAGNOSIS — Z5112 Encounter for antineoplastic immunotherapy: Secondary | ICD-10-CM | POA: Diagnosis not present

## 2020-12-16 DIAGNOSIS — K5900 Constipation, unspecified: Secondary | ICD-10-CM | POA: Insufficient documentation

## 2020-12-16 DIAGNOSIS — Z452 Encounter for adjustment and management of vascular access device: Secondary | ICD-10-CM | POA: Insufficient documentation

## 2020-12-16 DIAGNOSIS — Z5111 Encounter for antineoplastic chemotherapy: Secondary | ICD-10-CM | POA: Insufficient documentation

## 2020-12-16 DIAGNOSIS — C182 Malignant neoplasm of ascending colon: Secondary | ICD-10-CM | POA: Insufficient documentation

## 2020-12-16 DIAGNOSIS — C786 Secondary malignant neoplasm of retroperitoneum and peritoneum: Secondary | ICD-10-CM | POA: Diagnosis not present

## 2020-12-16 LAB — CBC WITH DIFFERENTIAL (CANCER CENTER ONLY)
Abs Immature Granulocytes: 0.03 10*3/uL (ref 0.00–0.07)
Basophils Absolute: 0 10*3/uL (ref 0.0–0.1)
Basophils Relative: 1 %
Eosinophils Absolute: 0.2 10*3/uL (ref 0.0–0.5)
Eosinophils Relative: 2 %
HCT: 45.8 % (ref 39.0–52.0)
Hemoglobin: 15.3 g/dL (ref 13.0–17.0)
Immature Granulocytes: 0 %
Lymphocytes Relative: 16 %
Lymphs Abs: 1.3 10*3/uL (ref 0.7–4.0)
MCH: 28.8 pg (ref 26.0–34.0)
MCHC: 33.4 g/dL (ref 30.0–36.0)
MCV: 86.1 fL (ref 80.0–100.0)
Monocytes Absolute: 0.8 10*3/uL (ref 0.1–1.0)
Monocytes Relative: 10 %
Neutro Abs: 5.8 10*3/uL (ref 1.7–7.7)
Neutrophils Relative %: 71 %
Platelet Count: 380 10*3/uL (ref 150–400)
RBC: 5.32 MIL/uL (ref 4.22–5.81)
RDW: 12.3 % (ref 11.5–15.5)
WBC Count: 8.1 10*3/uL (ref 4.0–10.5)
nRBC: 0 % (ref 0.0–0.2)

## 2020-12-16 LAB — CMP (CANCER CENTER ONLY)
ALT: 24 U/L (ref 0–44)
AST: 19 U/L (ref 15–41)
Albumin: 3.8 g/dL (ref 3.5–5.0)
Alkaline Phosphatase: 95 U/L (ref 38–126)
Anion gap: 12 (ref 5–15)
BUN: 12 mg/dL (ref 6–20)
CO2: 28 mmol/L (ref 22–32)
Calcium: 9.6 mg/dL (ref 8.9–10.3)
Chloride: 100 mmol/L (ref 98–111)
Creatinine: 1.22 mg/dL (ref 0.61–1.24)
GFR, Estimated: >60 mL/min (ref >60)
Glucose, Bld: 102 mg/dL — ABNORMAL HIGH (ref 70–99)
Potassium: 4.2 mmol/L (ref 3.5–5.1)
Sodium: 140 mmol/L (ref 135–145)
Total Bilirubin: 0.9 mg/dL (ref 0.3–1.2)
Total Protein: 7.9 g/dL (ref 6.5–8.1)

## 2020-12-16 NOTE — Progress Notes (Signed)
[place]   Telephone:(336) [phone] Fax:(336) [phone]   Clinic Follow up Note   Patient Care Team: [name], MD as PCP - General (Internal Medicine) [name], MD as Consulting Physician (Oncology) [name], RN as Oncology Nurse Navigator 12/16/2020  CHIEF COMPLAINT: Follow-up metastatic colon cancer  SUMMARY OF ONCOLOGIC HISTORY: Oncology History Overview Note  Cancer Staging No matching staging information was found for the patient.    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 Thickening of the terminal ileum with associated proximal mid to distal small bowel obstruction. Findings could be due to an ileitis versus malignancy. Nonspecific mesenteric edema could be due to engorgement versus metastases. No associated bowel perforation. Recommend endoscopy for further evaluation. 2. Indeterminate right lower quadrant lymphadenopathy. 3. Scattered colonic diverticulosis with no acute diverticulitis. 4. Stable right hepatic lobe subcentimeter hyperdensity likely represents a hepatic hemangioma. 5. No acute vascular abnormality. Aortic Atherosclerosis (ICD10-I70.0) - mild. 6. No acute intrathoracic abnormality.   11/28/2020 Imaging   CT AP  IMPRESSION: 1. There is masslike, circumferential thickening of the terminal ileum and cecal base near the ileocecal valve and abnormally enlarged lymph nodes in the right lower quadrant mesentery adjacent to the terminal ileum measuring up to 2.3 x 1.4 cm. 2. There is extensive omental and peritoneal nodularity and caking throughout the abdomen. 3. Findings are highly concerning for primary colon malignancy with nodal and peritoneal metastatic disease. 4. Small volume perihepatic and perisplenic ascites. 5. The small bowel is generally decompressed, with full some fluid-filled, nondistended loops throughout. There is  transit of oral enteric contrast to the terminal ileum. No evidence of overt bowel obstruction at this time. Esophagogastric tube is position with tip and side port below the diaphragm. 6. Atelectasis or consolidation of the dependent bilateral lung bases, new compared to prior examination.   Aortic Atherosclerosis (ICD10-I70.0).     11/29/2020 Surgery   EXPLORATORY LAPAROTOMY, PARTIAL BOWEL RESECTION, POSSIBLE OSTOMY CREATION, PERITIONEAL BIOPSY, INSERTION OF GASTROSTOMY TUBE by Dr [name]   11/29/2020 Initial Biopsy   FINAL MICROSCOPIC DIAGNOSIS:   AB. 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    COMMENT:   Immunohistochemistry (IHC) for CK20 and CDX-2 is strong and diffusely  positive.  CK7, TTF-1, Synaptophysin, Chromogranin and CD56 are  negative.  The immunophenotype is compatible with origin from the lower  gastrointestinal tract.  IHC for MMR will be reported separately.  Case  preliminarily discussed with Dr. [name] on 12/02/2020.   At the request of Dr. [name], [id number] was reviewed in retrospect.  Review of the submitted sections confirms the presence of acute  appendicitis.  No malignancy is identified.    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 Jurewicz returns for follow-up as scheduled, he is accompanied by  his son.  He  was last seen in new patient consult 12/04/2020.  Returns today for evaluation prior to starting first chemo on 12/18/2020.  He has good days and bad depending on his bowel movements and abdominal pain/bloating.  Today's a good day.  Has at least 1 BM a day but has had to strain.  Started MiraLAX yesterday which helped.  Mild nausea without vomiting.  Takes oxycodone and Compazine together 1-2 times per day which is effective.  Vents the abdominal tube occasionally for relief. He is trying to find ways to get adequate nutrition/hydration, could drink more.  He spoke with dietitian.   He is mobile, up at home.  Staples were removed 4/1 and Dr. [name] felt he was ready for treatment. All other systems were reviewed with the patient and are negative.  MEDICAL HISTORY:  Past Medical History:  Diagnosis Date  . Arthritis   . Family history of adverse reaction to anesthesia    mother had problem with it due to her asthma    SURGICAL HISTORY: Past Surgical History:  Procedure Laterality Date  . BOWEL RESECTION N/A 11/29/2020   Procedure: PARTIAL BOWEL RESECTION;  Surgeon: Jovita Kussmaul, MD;  Location: [place];  Service: General;  Laterality: N/A;  . GASTROSTOMY Left 11/29/2020   Procedure: INSERTION OF GASTROSTOMY TUBE;  Surgeon: Jovita Kussmaul, MD;  Location: [place];  Service: General;  Laterality: Left;  . IR IMAGING GUIDED PORT INSERTION  12/12/2020  . LAPAROSCOPIC APPENDECTOMY N/A 01/17/2019   Procedure: APPENDECTOMY LAPAROSCOPIC;  Surgeon: Ralene Ok, MD;  Location: [place];  Service: General;  Laterality: N/A;  . LAPAROTOMY N/A 11/29/2020   Procedure: EXPLORATORY LAPAROTOMY;  Surgeon: Jovita Kussmaul, MD;  Location: [place];  Service: General;  Laterality: N/A;  PUT CASE IN ROOM 1 STARTING AT 9:30AM FOR 120 MIN  . OSTOMY N/A 11/29/2020   Procedure: POSSIBLE OSTOMY CREATION;  Surgeon: Jovita Kussmaul, MD;  Location: [place];  Service: General;  Laterality: N/A;  . RECTAL BIOPSY N/A 11/29/2020   Procedure:  PERITIONEAL BIOPSY;  Surgeon: Jovita Kussmaul, MD;  Location: [place];  Service: General;  Laterality: N/A;  . SHOULDER SURGERY Left 09/19/2015    I have reviewed the social history and family history with the patient and they are unchanged from previous note.  ALLERGIES:  has No Known Allergies.  MEDICATIONS:  Current Outpatient Medications  Medication Sig Dispense Refill  . acetaminophen (TYLENOL) 500 MG tablet Take 1,000 mg by mouth every 6 (six) hours as needed for mild pain.    Marland Kitchen lidocaine-prilocaine (EMLA) cream Apply to affected area once (Patient taking differently: Apply 1 application topically daily as needed. Apply to affected area once) 30 g 3  . ondansetron (ZOFRAN) 8 MG tablet Take 1 tablet (8 mg total) by mouth 2 (two) times daily as needed for refractory nausea / vomiting. Start on day 3 after chemotherapy. 30 tablet 1  . Oxycodone HCl 10 MG TABS Take 1 tablet (10 mg total) by mouth every 6 (six) hours as needed. (Patient taking differently: Take 10 mg by mouth every 6 (six) hours as needed (pain).) 60 tablet 0  . prochlorperazine (COMPAZINE) 10 MG tablet Take 1 tablet (10 mg total) by mouth every 6 (six) hours as needed for nausea or vomiting. 30 tablet 0   No current facility-administered medications for this visit.    PHYSICAL EXAMINATION: ECOG PERFORMANCE STATUS: 1 - Symptomatic but completely ambulatory  Vitals:   12/16/20 0859  BP: (!) 125/92  Pulse: 98  Resp: 19  Temp: (!) 97.2 F (36.2 C)  SpO2: 95%   Filed Weights   12/16/20 0859  Weight: 234 lb 9.6 oz (106.4 kg)    GENERAL:alert, no distress and comfortable SKIN: No rash EYES:  sclera clear LUNGS: clear to auscultation with normal breathing effort HEART: regular rate & rhythm, no lower extremity edema ABDOMEN:abdomen soft, non-tender and normal bowel sounds.  G-tube sutures intact, no erythema or drainage.  Midline surgical incision closed, staples removed, some dried blood otherwise no erythema or  drainage NEURO: alert & oriented x 3 with fluent speech PAC right chest site without erythema or drainage  LABORATORY DATA:  I have reviewed the data as listed CBC Latest Ref Rng & Units 12/16/2020 12/01/2020 11/30/2020  WBC 4.0 - 10.5 K/uL 8.1 7.5 11.2(H)  Hemoglobin 13.0 - 17.0 g/dL 15.3 12.6(L) 14.3  Hematocrit 39.0 - 52.0 % 45.8 36.3(L) 42.7  Platelets 150 - 400 K/uL 380 256 301     CMP Latest Ref Rng & Units 12/16/2020 12/01/2020 11/29/2020  Glucose 70 - 99 mg/dL 102(H) 83 93  BUN 6 - 20 mg/dL '12 11 7  ' Creatinine 0.61 - 1.24 mg/dL 1.22 1.13 1.08  Sodium 135 - 145 mmol/L 140 138 135  Potassium 3.5 - 5.1 mmol/L 4.2 3.6 3.7  Chloride 98 - 111 mmol/L 100 104 102  CO2 22 - 32 mmol/L '28 24 26  ' Calcium 8.9 - 10.3 mg/dL 9.6 8.1(L) 8.7(L)  Total Protein 6.5 - 8.1 g/dL 7.9 - -  Total Bilirubin 0.3 - 1.2 mg/dL 0.9 - -  Alkaline Phos 38 - 126 U/L 95 - -  AST 15 - 41 U/L 19 - -  ALT 0 - 44 U/L 24 - -      RADIOGRAPHIC STUDIES: I have personally reviewed the radiological images as listed and agreed with the findings in the report. No results found.   ASSESSMENT & PLAN: JODI KAPPES is a 55 y.o. Caucasian male with no significant medical history.    1. 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 Work up showed mass in the terminal ileum with extensive omental nodularity and LN involvement. His baseline tumor marker CA 19-9 and CEA were normal.  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   This confirms stage IV metastatic cancer from cecum -He previously consented  to first-line palliative chemo with FOLFOX.  If he has excellent response to treatment we may refer him for HIPEC debulking surgery in the future. -FO is pending to see if he is eligible for target therapy or immunotherapy -Pending start cycle 1 FOLFOX on 12/18/2020  2. Symptom Management: Constipation, lower abdominal pain, abdominal bloating secondary #1 and partial bowl obstruction  -For 8-9 weeks before hospitalization he was having increasing lower abdominal pain and constipation -He did have G-tube placed with surgery on 11/29/20. He notes/drains with bag as needed.  -Since hospital discharge he has been able to have adequate BM, usually once daily.  Started MiraLAX on 12/14/2020.  Goal to have adequate BM every 1-2 days -Recommended low residue diet with small frequent meals  and nutrition supplements. -He met with dietitian -His regimen consists of oxycodone with Compazine 1-2 times daily plus MiraLAX (can increase to twice a day and add Colace if needed), which is effective at this time, no signs of recurrent bowel obstruction -Encouraged light activity such as walking  3. Genetic testing  -He has brother with prostate cancer and sister with breast cancer in their 34s. He is eligible for genetic testing. He is interested.  -Consult with Roma Kayser scheduled on 12/25/2020  4. Social Support -He is an avid cyclist. Last long distant riding was 06/2020 across [place]. He has not biked in the 8-9 weeks before cancer diagnosis due to symptoms. His weight heavily fluctuates with changes in his activity level, diet and biking.  -He is married with 1 adult son. His brother Jenny Reichmann is in town. He works as a Designer, television/film set at [employer]. He may take time off work based on how to tolerate chemotherapy.  -I offered cone resources including chaplin and SW. He notes he has good family support and support from his chaplin.  -both pt and his wife are very overwhelmed with the cancer diagnosis and  overall poor prognosis, I will refer him to SW for counseling   Disposition: Mr. Tedesco appears stable.  Staples removed at surgical follow-up on 4/1, he has recovered well.  There is no clinical evidence of recurrent bowel obstruction.    Labs reviewed, adequate to proceed with cycle 1 FOLFOX starting 12/18/2020.  We again reviewed potential toxicities and symptom management.  We will schedule toxicity check phone follow-up next week.  He knows to call sooner if he has any side effects or new/worsening concerns.  Follow-up in 2 weeks with cycle 2.  The plan was reviewed with Dr. Burr Medico.  All questions were answered. The patient knows to call the clinic with any problems, questions or concerns. No barriers to learning were detected.     Alla Feeling, NP 12/16/20

## 2020-12-18 ENCOUNTER — Inpatient Hospital Stay: Payer: BC Managed Care – PPO

## 2020-12-18 ENCOUNTER — Other Ambulatory Visit: Payer: Self-pay

## 2020-12-18 VITALS — BP 104/82 | HR 94 | Temp 98.5°F | Resp 18

## 2020-12-18 DIAGNOSIS — C182 Malignant neoplasm of ascending colon: Secondary | ICD-10-CM

## 2020-12-18 DIAGNOSIS — C772 Secondary and unspecified malignant neoplasm of intra-abdominal lymph nodes: Secondary | ICD-10-CM

## 2020-12-18 DIAGNOSIS — Z5112 Encounter for antineoplastic immunotherapy: Secondary | ICD-10-CM | POA: Diagnosis not present

## 2020-12-18 MED ORDER — PALONOSETRON HCL INJECTION 0.25 MG/5ML
0.2500 mg | Freq: Once | INTRAVENOUS | Status: AC
  Administered 2020-12-18: 0.25 mg via INTRAVENOUS

## 2020-12-18 MED ORDER — HEPARIN SOD (PORK) LOCK FLUSH 100 UNIT/ML IV SOLN
500.0000 [IU] | Freq: Once | INTRAVENOUS | Status: DC | PRN
  Filled 2020-12-18: qty 5

## 2020-12-18 MED ORDER — SODIUM CHLORIDE 0.9 % IV SOLN
2400.0000 mg/m2 | INTRAVENOUS | Status: DC
  Administered 2020-12-18: 5700 mg via INTRAVENOUS
  Filled 2020-12-18: qty 114

## 2020-12-18 MED ORDER — SODIUM CHLORIDE 0.9% FLUSH
10.0000 mL | INTRAVENOUS | Status: DC | PRN
  Filled 2020-12-18: qty 10

## 2020-12-18 MED ORDER — PALONOSETRON HCL INJECTION 0.25 MG/5ML
INTRAVENOUS | Status: AC
  Filled 2020-12-18: qty 5

## 2020-12-18 MED ORDER — SODIUM CHLORIDE 0.9 % IV SOLN
10.0000 mg | Freq: Once | INTRAVENOUS | Status: AC
  Administered 2020-12-18: 10 mg via INTRAVENOUS
  Filled 2020-12-18: qty 10

## 2020-12-18 MED ORDER — OXALIPLATIN CHEMO INJECTION 100 MG/20ML
85.0000 mg/m2 | Freq: Once | INTRAVENOUS | Status: AC
  Administered 2020-12-18: 200 mg via INTRAVENOUS
  Filled 2020-12-18: qty 40

## 2020-12-18 MED ORDER — DEXTROSE 5 % IV SOLN
Freq: Once | INTRAVENOUS | Status: AC
  Filled 2020-12-18: qty 250

## 2020-12-18 MED ORDER — LEUCOVORIN CALCIUM INJECTION 350 MG
400.0000 mg/m2 | Freq: Once | INTRAVENOUS | Status: AC
  Administered 2020-12-18: 948 mg via INTRAVENOUS
  Filled 2020-12-18: qty 47.4

## 2020-12-18 NOTE — Patient Instructions (Signed)
[facility] Discharge Instructions for Patients Receiving Chemotherapy  Today you received the following chemotherapy agents: oxaliplatin, leucovorin, fluorouracil   To help prevent nausea and vomiting after your treatment, we encourage you to take your nausea medication as needed. Don't take zofran for 3 days following treatment. If you need medication for nausea before the 3 days, take compazine instead.   If you develop nausea and vomiting that is not controlled by your nausea medication, call the clinic.   BELOW ARE SYMPTOMS THAT SHOULD BE REPORTED IMMEDIATELY:  *FEVER GREATER THAN 100.5 F  *CHILLS WITH OR WITHOUT FEVER  NAUSEA AND VOMITING THAT IS NOT CONTROLLED WITH YOUR NAUSEA MEDICATION  *UNUSUAL SHORTNESS OF BREATH  *UNUSUAL BRUISING OR BLEEDING  TENDERNESS IN MOUTH AND THROAT WITH OR WITHOUT PRESENCE OF ULCERS  *URINARY PROBLEMS  *BOWEL PROBLEMS  UNUSUAL RASH Items with * indicate a potential emergency and should be followed up as soon as possible.  Feel free to call the clinic should you have any questions or concerns. The clinic phone number is [phone].  Please show the [facility] at check-in to the Emergency Department and triage nurse.  Oxaliplatin Injection What is this medicine? OXALIPLATIN (ox AL i PLA tin) is a chemotherapy drug. It targets fast dividing cells, like cancer cells, and causes these cells to die. This medicine is used to treat cancers of the colon and rectum, and many other cancers. This medicine may be used for other purposes; ask your health care provider or pharmacist if you have questions. COMMON BRAND NAME(S): Eloxatin What should I tell my health care provider before I take this medicine? They need to know if you have any of these conditions:  heart disease  history of irregular heartbeat  liver disease  low blood counts, like white cells, platelets, or red blood cells  lung or breathing disease, like  asthma  take medicines that treat or prevent blood clots  tingling of the fingers or toes, or other nerve disorder  an unusual or allergic reaction to oxaliplatin, other chemotherapy, other medicines, foods, dyes, or preservatives  pregnant or trying to get pregnant  breast-feeding How should I use this medicine? This drug is given as an infusion into a vein. It is administered in a hospital or clinic by a specially trained health care professional. Talk to your pediatrician regarding the use of this medicine in children. Special care may be needed. Overdosage: If you think you have taken too much of this medicine contact a poison control center or emergency room at once. NOTE: This medicine is only for you. Do not share this medicine with others. What if I miss a dose? It is important not to miss a dose. Call your doctor or health care professional if you are unable to keep an appointment. What may interact with this medicine? Do not take this medicine with any of the following medications:  cisapride  dronedarone  pimozide  thioridazine This medicine may also interact with the following medications:  aspirin and aspirin-like medicines  certain medicines that treat or prevent blood clots like warfarin, apixaban, dabigatran, and rivaroxaban  cisplatin  cyclosporine  diuretics  medicines for infection like acyclovir, adefovir, amphotericin B, bacitracin, cidofovir, foscarnet, ganciclovir, gentamicin, pentamidine, vancomycin  NSAIDs, medicines for pain and inflammation, like ibuprofen or naproxen  other medicines that prolong the QT interval (an abnormal heart rhythm)  pamidronate  zoledronic acid This list may not describe all possible interactions. Give your health care provider  a list of all the medicines, herbs, non-prescription drugs, or dietary supplements you use. Also tell them if you smoke, drink alcohol, or use illegal drugs. Some items may interact with your  medicine. What should I watch for while using this medicine? Your condition will be monitored carefully while you are receiving this medicine. You may need blood work done while you are taking this medicine. This medicine may make you feel generally unwell. This is not uncommon as chemotherapy can affect healthy cells as well as cancer cells. Report any side effects. Continue your course of treatment even though you feel ill unless your healthcare professional tells you to stop. This medicine can make you more sensitive to cold. Do not drink cold drinks or use ice. Cover exposed skin before coming in contact with cold temperatures or cold objects. When out in cold weather wear warm clothing and cover your mouth and nose to warm the air that goes into your lungs. Tell your doctor if you get sensitive to the cold. Do not become pregnant while taking this medicine or for 9 months after stopping it. Women should inform their health care professional if they wish to become pregnant or think they might be pregnant. Men should not father a child while taking this medicine and for 6 months after stopping it. There is potential for serious side effects to an unborn child. Talk to your health care professional for more information. Do not breast-feed a child while taking this medicine or for 3 months after stopping it. This medicine has caused ovarian failure in some women. This medicine may make it more difficult to get pregnant. Talk to your health care professional if you are concerned about your fertility. This medicine has caused decreased sperm counts in some men. This may make it more difficult to father a child. Talk to your health care professional if you are concerned about your fertility. This medicine may increase your risk of getting an infection. Call your health care professional for advice if you get a fever, chills, or sore throat, or other symptoms of a cold or flu. Do not treat yourself. Try to  avoid being around people who are sick. Avoid taking medicines that contain aspirin, acetaminophen, ibuprofen, naproxen, or ketoprofen unless instructed by your health care professional. These medicines may hide a fever. Be careful brushing or flossing your teeth or using a toothpick because you may get an infection or bleed more easily. If you have any dental work done, tell your dentist you are receiving this medicine. What side effects may I notice from receiving this medicine? Side effects that you should report to your doctor or health care professional as soon as possible:  allergic reactions like skin rash, itching or hives, swelling of the face, lips, or tongue  breathing problems  cough  low blood counts - this medicine may decrease the number of white blood cells, red blood cells, and platelets. You may be at increased risk for infections and bleeding  nausea, vomiting  pain, redness, or irritation at site where injected  pain, tingling, numbness in the hands or feet  signs and symptoms of bleeding such as bloody or black, tarry stools; red or dark brown urine; spitting up blood or brown material that looks like coffee grounds; red spots on the skin; unusual bruising or bleeding from the eyes, gums, or nose  signs and symptoms of a dangerous change in heartbeat or heart rhythm like chest pain; dizziness; fast, irregular heartbeat; palpitations; feeling  faint or lightheaded; falls  signs and symptoms of infection like fever; chills; cough; sore throat; pain or trouble passing urine  signs and symptoms of liver injury like dark yellow or brown urine; general ill feeling or flu-like symptoms; light-colored stools; loss of appetite; nausea; right upper belly pain; unusually weak or tired; yellowing of the eyes or skin  signs and symptoms of low red blood cells or anemia such as unusually weak or tired; feeling faint or lightheaded; falls  signs and symptoms of muscle injury like  dark urine; trouble passing urine or change in the amount of urine; unusually weak or tired; muscle pain; back pain Side effects that usually do not require medical attention (report to your doctor or health care professional if they continue or are bothersome):  changes in taste  diarrhea  gas  hair loss  loss of appetite  mouth sores This list may not describe all possible side effects. Call your doctor for medical advice about side effects. You may report side effects to FDA at 1-800-FDA-1088. Where should I keep my medicine? This drug is given in a hospital or clinic and will not be stored at home. NOTE: This sheet is a summary. It may not cover all possible information. If you have questions about this medicine, talk to your doctor, pharmacist, or health care provider.  2021 [employer] Standard (2019-01-18 12:20:35)  Leucovorin injection What is this medicine? LEUCOVORIN (loo koe VOR in) is used to prevent or treat the harmful effects of some medicines. This medicine is used to treat anemia caused by a low amount of folic acid in the body. It is also used with 5-fluorouracil (5-FU) to treat colon cancer. This medicine may be used for other purposes; ask your health care provider or pharmacist if you have questions. What should I tell my health care provider before I take this medicine? They need to know if you have any of these conditions:  anemia from low levels of vitamin B-12 in the blood  an unusual or allergic reaction to leucovorin, folic acid, other medicines, foods, dyes, or preservatives  pregnant or trying to get pregnant  breast-feeding How should I use this medicine? This medicine is for injection into a muscle or into a vein. It is given by a health care professional in a hospital or clinic setting. Talk to your pediatrician regarding the use of this medicine in children. Special care may be needed. Overdosage: If you think you have taken too much of this  medicine contact a poison control center or emergency room at once. NOTE: This medicine is only for you. Do not share this medicine with others. What if I miss a dose? This does not apply. What may interact with this medicine?  capecitabine  fluorouracil  phenobarbital  phenytoin  primidone  trimethoprim-sulfamethoxazole This list may not describe all possible interactions. Give your health care provider a list of all the medicines, herbs, non-prescription drugs, or dietary supplements you use. Also tell them if you smoke, drink alcohol, or use illegal drugs. Some items may interact with your medicine. What should I watch for while using this medicine? Your condition will be monitored carefully while you are receiving this medicine. This medicine may increase the side effects of 5-fluorouracil, 5-FU. Tell your doctor or health care professional if you have diarrhea or mouth sores that do not get better or that get worse. What side effects may I notice from receiving this medicine? Side effects that you should report to your  doctor or health care professional as soon as possible:  allergic reactions like skin rash, itching or hives, swelling of the face, lips, or tongue  breathing problems  fever, infection  mouth sores  unusual bleeding or bruising  unusually weak or tired Side effects that usually do not require medical attention (report to your doctor or health care professional if they continue or are bothersome):  constipation or diarrhea  loss of appetite  nausea, vomiting This list may not describe all possible side effects. Call your doctor for medical advice about side effects. You may report side effects to FDA at 1-800-FDA-1088. Where should I keep my medicine? This drug is given in a hospital or clinic and will not be stored at home. NOTE: This sheet is a summary. It may not cover all possible information. If you have questions about this medicine, talk to your  doctor, pharmacist, or health care provider.  2021 Elsevier/Gold Standard (2008-03-06 16:50:29)  Fluorouracil, 5-FU injection What is this medicine? FLUOROURACIL, 5-FU (flure oh YOOR a sil) is a chemotherapy drug. It slows the growth of cancer cells. This medicine is used to treat many types of cancer like breast cancer, colon or rectal cancer, pancreatic cancer, and stomach cancer. This medicine may be used for other purposes; ask your health care provider or pharmacist if you have questions. COMMON BRAND NAME(S): Adrucil What should I tell my health care provider before I take this medicine? They need to know if you have any of these conditions:  blood disorders  dihydropyrimidine dehydrogenase (DPD) deficiency  infection (especially a virus infection such as chickenpox, cold sores, or herpes)  kidney disease  liver disease  malnourished, poor nutrition  recent or ongoing radiation therapy  an unusual or allergic reaction to fluorouracil, other chemotherapy, other medicines, foods, dyes, or preservatives  pregnant or trying to get pregnant  breast-feeding How should I use this medicine? This drug is given as an infusion or injection into a vein. It is administered in a hospital or clinic by a specially trained health care professional. Talk to your pediatrician regarding the use of this medicine in children. Special care may be needed. Overdosage: If you think you have taken too much of this medicine contact a poison control center or emergency room at once. NOTE: This medicine is only for you. Do not share this medicine with others. What if I miss a dose? It is important not to miss your dose. Call your doctor or health care professional if you are unable to keep an appointment. What may interact with this medicine? Do not take this medicine with any of the following medications:  live virus vaccines This medicine may also interact with the following  medications:  medicines that treat or prevent blood clots like warfarin, enoxaparin, and dalteparin This list may not describe all possible interactions. Give your health care provider a list of all the medicines, herbs, non-prescription drugs, or dietary supplements you use. Also tell them if you smoke, drink alcohol, or use illegal drugs. Some items may interact with your medicine. What should I watch for while using this medicine? Visit your doctor for checks on your progress. This drug may make you feel generally unwell. This is not uncommon, as chemotherapy can affect healthy cells as well as cancer cells. Report any side effects. Continue your course of treatment even though you feel ill unless your doctor tells you to stop. In some cases, you may be given additional medicines to help with side effects.  Follow all directions for their use. Call your doctor or health care professional for advice if you get a fever, chills or sore throat, or other symptoms of a cold or flu. Do not treat yourself. This drug decreases your body's ability to fight infections. Try to avoid being around people who are sick. This medicine may increase your risk to bruise or bleed. Call your doctor or health care professional if you notice any unusual bleeding. Be careful brushing and flossing your teeth or using a toothpick because you may get an infection or bleed more easily. If you have any dental work done, tell your dentist you are receiving this medicine. Avoid taking products that contain aspirin, acetaminophen, ibuprofen, naproxen, or ketoprofen unless instructed by your doctor. These medicines may hide a fever. Do not become pregnant while taking this medicine. Women should inform their doctor if they wish to become pregnant or think they might be pregnant. There is a potential for serious side effects to an unborn child. Talk to your health care professional or pharmacist for more information. Do not breast-feed  an infant while taking this medicine. Men should inform their doctor if they wish to father a child. This medicine may lower sperm counts. Do not treat diarrhea with over the counter products. Contact your doctor if you have diarrhea that lasts more than 2 days or if it is severe and watery. This medicine can make you more sensitive to the sun. Keep out of the sun. If you cannot avoid being in the sun, wear protective clothing and use sunscreen. Do not use sun lamps or tanning beds/booths. What side effects may I notice from receiving this medicine? Side effects that you should report to your doctor or health care professional as soon as possible:  allergic reactions like skin rash, itching or hives, swelling of the face, lips, or tongue  low blood counts - this medicine may decrease the number of white blood cells, red blood cells and platelets. You may be at increased risk for infections and bleeding.  signs of infection - fever or chills, cough, sore throat, pain or difficulty passing urine  signs of decreased platelets or bleeding - bruising, pinpoint red spots on the skin, black, tarry stools, blood in the urine  signs of decreased red blood cells - unusually weak or tired, fainting spells, lightheadedness  breathing problems  changes in vision  chest pain  mouth sores  nausea and vomiting  pain, swelling, redness at site where injected  pain, tingling, numbness in the hands or feet  redness, swelling, or sores on hands or feet  stomach pain  unusual bleeding Side effects that usually do not require medical attention (report to your doctor or health care professional if they continue or are bothersome):  changes in finger or toe nails  diarrhea  dry or itchy skin  hair loss  headache  loss of appetite  sensitivity of eyes to the light  stomach upset  unusually teary eyes This list may not describe all possible side effects. Call your doctor for medical  advice about side effects. You may report side effects to FDA at 1-800-FDA-1088. Where should I keep my medicine? This drug is given in a hospital or clinic and will not be stored at home. NOTE: This sheet is a summary. It may not cover all possible information. If you have questions about this medicine, talk to your doctor, pharmacist, or health care provider.  2021 [employer]/Gold Standard (2019-08-01 15:00:03)

## 2020-12-19 ENCOUNTER — Telehealth: Payer: Self-pay | Admitting: *Deleted

## 2020-12-19 ENCOUNTER — Telehealth: Payer: Self-pay | Admitting: Hematology

## 2020-12-19 NOTE — Telephone Encounter (Signed)
Scheduled per los. Called and left msg. Mailed printout  °

## 2020-12-20 ENCOUNTER — Inpatient Hospital Stay: Payer: BC Managed Care – PPO

## 2020-12-20 ENCOUNTER — Other Ambulatory Visit: Payer: Self-pay

## 2020-12-20 VITALS — BP 121/86 | HR 93 | Temp 98.6°F | Resp 18 | Wt 234.0 lb

## 2020-12-20 DIAGNOSIS — C772 Secondary and unspecified malignant neoplasm of intra-abdominal lymph nodes: Secondary | ICD-10-CM

## 2020-12-20 DIAGNOSIS — C182 Malignant neoplasm of ascending colon: Secondary | ICD-10-CM

## 2020-12-20 DIAGNOSIS — Z5112 Encounter for antineoplastic immunotherapy: Secondary | ICD-10-CM | POA: Diagnosis not present

## 2020-12-20 MED ORDER — SODIUM CHLORIDE 0.9% FLUSH
10.0000 mL | INTRAVENOUS | Status: DC | PRN
  Administered 2020-12-20: 10 mL
  Filled 2020-12-20: qty 10

## 2020-12-20 MED ORDER — HEPARIN SOD (PORK) LOCK FLUSH 100 UNIT/ML IV SOLN
500.0000 [IU] | Freq: Once | INTRAVENOUS | Status: AC | PRN
  Administered 2020-12-20: 500 [IU]
  Filled 2020-12-20: qty 5

## 2020-12-23 ENCOUNTER — Encounter (HOSPITAL_COMMUNITY): Payer: Self-pay | Admitting: Hematology

## 2020-12-23 ENCOUNTER — Telehealth: Payer: Self-pay

## 2020-12-23 ENCOUNTER — Other Ambulatory Visit: Payer: Self-pay | Admitting: Nurse Practitioner

## 2020-12-23 MED ORDER — OXYCODONE HCL 10 MG PO TABS: 10.0000 mg | ORAL_TABLET | Freq: Four times a day (QID) | ORAL | 0 refills | Status: DC | PRN

## 2020-12-23 NOTE — Telephone Encounter (Signed)
Patient calls for refill on Oxycodone, last filled 3/23 #[phone].  He would like this sent into [facility] on file.

## 2020-12-24 ENCOUNTER — Telehealth: Payer: Self-pay | Admitting: Nurse Practitioner

## 2020-12-24 NOTE — Telephone Encounter (Signed)
Left message with moved upcoming appointment due to double-booked provider. Gave option to call back to reschedule if needed.

## 2020-12-25 ENCOUNTER — Inpatient Hospital Stay (HOSPITAL_BASED_OUTPATIENT_CLINIC_OR_DEPARTMENT_OTHER): Payer: BC Managed Care – PPO | Admitting: Genetic Counselor

## 2020-12-25 ENCOUNTER — Encounter: Payer: Self-pay | Admitting: Genetic Counselor

## 2020-12-25 ENCOUNTER — Inpatient Hospital Stay: Payer: BC Managed Care – PPO

## 2020-12-25 ENCOUNTER — Other Ambulatory Visit: Payer: Self-pay

## 2020-12-25 DIAGNOSIS — C772 Secondary and unspecified malignant neoplasm of intra-abdominal lymph nodes: Secondary | ICD-10-CM | POA: Diagnosis not present

## 2020-12-25 DIAGNOSIS — Z8042 Family history of malignant neoplasm of prostate: Secondary | ICD-10-CM | POA: Diagnosis not present

## 2020-12-25 DIAGNOSIS — Z803 Family history of malignant neoplasm of breast: Secondary | ICD-10-CM | POA: Insufficient documentation

## 2020-12-25 DIAGNOSIS — C182 Malignant neoplasm of ascending colon: Secondary | ICD-10-CM | POA: Diagnosis not present

## 2020-12-25 DIAGNOSIS — Z5112 Encounter for antineoplastic immunotherapy: Secondary | ICD-10-CM | POA: Diagnosis not present

## 2020-12-25 LAB — CBC WITH DIFFERENTIAL (CANCER CENTER ONLY)
Abs Immature Granulocytes: 0.07 10*3/uL (ref 0.00–0.07)
Basophils Absolute: 0 10*3/uL (ref 0.0–0.1)
Basophils Relative: 1 %
Eosinophils Absolute: 0.4 10*3/uL (ref 0.0–0.5)
Eosinophils Relative: 6 %
HCT: 42.7 % (ref 39.0–52.0)
Hemoglobin: 14.7 g/dL (ref 13.0–17.0)
Immature Granulocytes: 1 %
Lymphocytes Relative: 19 %
Lymphs Abs: 1.2 10*3/uL (ref 0.7–4.0)
MCH: 28.8 pg (ref 26.0–34.0)
MCHC: 34.4 g/dL (ref 30.0–36.0)
MCV: 83.6 fL (ref 80.0–100.0)
Monocytes Absolute: 0.7 10*3/uL (ref 0.1–1.0)
Monocytes Relative: 11 %
Neutro Abs: 4.1 10*3/uL (ref 1.7–7.7)
Neutrophils Relative %: 62 %
Platelet Count: 280 10*3/uL (ref 150–400)
RBC: 5.11 MIL/uL (ref 4.22–5.81)
RDW: 11.9 % (ref 11.5–15.5)
WBC Count: 6.4 10*3/uL (ref 4.0–10.5)
nRBC: 0 % (ref 0.0–0.2)

## 2020-12-25 LAB — CMP (CANCER CENTER ONLY)
ALT: 41 U/L (ref 0–44)
AST: 28 U/L (ref 15–41)
Albumin: 4 g/dL (ref 3.5–5.0)
Alkaline Phosphatase: 104 U/L (ref 38–126)
Anion gap: 12 (ref 5–15)
BUN: 11 mg/dL (ref 6–20)
CO2: 25 mmol/L (ref 22–32)
Calcium: 9.5 mg/dL (ref 8.9–10.3)
Chloride: 102 mmol/L (ref 98–111)
Creatinine: 1.15 mg/dL (ref 0.61–1.24)
GFR, Estimated: >60 mL/min (ref >60)
Glucose, Bld: 101 mg/dL — ABNORMAL HIGH (ref 70–99)
Potassium: 3.9 mmol/L (ref 3.5–5.1)
Sodium: 139 mmol/L (ref 135–145)
Total Bilirubin: 0.5 mg/dL (ref 0.3–1.2)
Total Protein: 8.1 g/dL (ref 6.5–8.1)

## 2020-12-25 LAB — GENETIC SCREENING ORDER

## 2020-12-25 NOTE — Progress Notes (Signed)
REFERRING PROVIDER: [name], MD [place],  [place] [address]  PRIMARY PROVIDER:  [name], MD  PRIMARY REASON FOR VISIT:  1. 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 Family history of breast cancer   3. Family history of prostate cancer      HISTORY OF PRESENT ILLNESS:   Ricardo [name], a 55 y.o. male, was seen for a [facility] cancer genetics consultation at the request of Dr. Burr Medico due to a personal and family history of cancer.  Ricardo [name] presents to clinic today to discuss the possibility of a hereditary predisposition to cancer, genetic testing, and to further clarify his future cancer risks, as well as potential cancer risks for family members.   In 2022, at the age of 67, Ricardo [name] was diagnosed with cancer of the ascending colon. The treatment plan includes chemotherapy and surgery.     CANCER HISTORY:  Oncology History Overview Note  Cancer Staging No matching staging information was found for the patient.    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 Thickening of the terminal ileum with associated proximal mid to distal small bowel obstruction. Findings could be due to an ileitis versus malignancy. Nonspecific mesenteric edema could be due to engorgement versus metastases. No associated bowel perforation. Recommend endoscopy for further evaluation. 2. Indeterminate right lower quadrant lymphadenopathy. 3. Scattered colonic diverticulosis with no acute diverticulitis. 4. Stable right hepatic lobe subcentimeter hyperdensity likely represents a hepatic hemangioma. 5. No acute vascular abnormality. Aortic Atherosclerosis (ICD10-I70.0) - mild. 6. No acute intrathoracic abnormality.   11/28/2020 Imaging   CT AP  IMPRESSION: 1. There is masslike, circumferential thickening of the terminal ileum and cecal base near the  ileocecal valve and abnormally enlarged lymph nodes in the right lower quadrant mesentery adjacent to the terminal ileum measuring up to 2.3 x 1.4 cm. 2. There is extensive omental and peritoneal nodularity and caking throughout the abdomen. 3. Findings are highly concerning for primary colon malignancy with nodal and peritoneal metastatic disease. 4. Small volume perihepatic and perisplenic ascites. 5. The small bowel is generally decompressed, with full some fluid-filled, nondistended loops throughout. There is transit of oral enteric contrast to the terminal ileum. No evidence of overt bowel obstruction at this time. Esophagogastric tube is position with tip and side port below the diaphragm. 6. Atelectasis or consolidation of the dependent bilateral lung bases, new compared to prior examination.   Aortic Atherosclerosis (ICD10-I70.0).     11/29/2020 Surgery   EXPLORATORY LAPAROTOMY, PARTIAL BOWEL RESECTION, POSSIBLE OSTOMY CREATION, PERITIONEAL BIOPSY, INSERTION OF GASTROSTOMY TUBE by Dr [name]   11/29/2020 Initial Biopsy   FINAL MICROSCOPIC DIAGNOSIS:   AB. 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    COMMENT:   Immunohistochemistry (IHC) for CK20 and CDX-2 is strong and diffusely  positive.  CK7, TTF-1, Synaptophysin, Chromogranin and CD56 are  negative.  The immunophenotype is compatible with origin from the lower  gastrointestinal tract.  IHC for MMR will be reported separately.  Case  preliminarily discussed with Dr. [name] on 12/02/2020.   At the request of Dr. [name], [phone] was reviewed in retrospect.  Review of the submitted sections confirms the presence of acute  appendicitis.  No malignancy is identified.    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 Arthritis   . Family history of adverse reaction to anesthesia    mother had problem with it due to her asthma  . Family history of breast cancer   . Family history of prostate cancer     Past Surgical History:  Procedure Laterality Date  . BOWEL RESECTION N/A 11/29/2020   Procedure: PARTIAL BOWEL RESECTION;  Surgeon: Jovita Kussmaul, MD;  Location: [facility];  Service: General;  Laterality: N/A;  . GASTROSTOMY Left 11/29/2020   Procedure: INSERTION OF GASTROSTOMY TUBE;  Surgeon: Jovita Kussmaul, MD;  Location: [facility];  Service: General;  Laterality: Left;  . IR IMAGING GUIDED PORT INSERTION  12/12/2020  . LAPAROSCOPIC APPENDECTOMY N/A 01/17/2019   Procedure: APPENDECTOMY LAPAROSCOPIC;  Surgeon: Ralene Ok, MD;  Location: [facility];  Service: General;  Laterality: N/A;  . LAPAROTOMY N/A 11/29/2020   Procedure: EXPLORATORY LAPAROTOMY;  Surgeon: Jovita Kussmaul, MD;  Location: [facility];  Service: General;  Laterality: N/A;  PUT CASE IN ROOM 1 STARTING AT 9:30AM FOR 120 MIN  . OSTOMY N/A 11/29/2020   Procedure: POSSIBLE OSTOMY CREATION;  Surgeon: Jovita Kussmaul, MD;  Location: [facility];  Service: General;  Laterality: N/A;  . RECTAL BIOPSY N/A 11/29/2020   Procedure: PERITIONEAL BIOPSY;  Surgeon: Jovita Kussmaul, MD;  Location: [facility];  Service: General;  Laterality: N/A;  . SHOULDER SURGERY Left 09/19/2015    Social History   Socioeconomic History  . Marital status: Single    Spouse name: Not on file  . Number of children: Not on file  . Years of education: Not on file  . Highest education level: Not on file   Occupational History  . Not on file  Tobacco Use  . Smoking status: Never Smoker  . Smokeless tobacco: Never Used  Vaping Use  . Vaping Use: Never used  Substance and Sexual Activity  . Alcohol use: Not Currently    Comment: rare  . Drug use: Never  . Sexual activity: Not Currently  Other Topics Concern  . Not on file  Social History Narrative  . Not on file   Social Determinants of Health   Financial Resource Strain: Not on file  Food Insecurity: Not on file  Transportation Needs: Not on file  Physical Activity: Not on file  Stress: Not on file  Social Connections: Not on file     FAMILY HISTORY:  We obtained a detailed, 4-generation family history.  Significant diagnoses are listed below: Family History  Problem Relation Age of Onset  . Cancer Sister 72       breast cancer  . Cancer Brother 64       prostate cancer   . High Cholesterol Brother   . Pancreatic cancer Maternal Uncle   . Bone cancer Cousin        pat first cousin  . Colon cancer Neg Hx   . Liver disease Neg Hx   . Esophageal cancer Neg  Hx   . Stomach cancer Neg Hx     The patient has one son who is cancer free.  He has two brothers and two sisters.  One sister had breast cancer at 70 and one brother had both prostate cancer and skin cancer at 2.  Both parents are deceased.  The patient's mother was adopted.  She had one full brother who had pancreatic cancer.  There is no other known cancer in the maternal family.  The patient's father died of heart failure.  He had a brother and a paternal half sister who are cancer free.  The brother had a son who had bone cancer at 74. The paternal grandparents are deceased.  Ricardo [name] is unaware of previous family history of genetic testing for hereditary cancer risks. Patient's maternal ancestors are of [place] descent, and paternal ancestors are of [place] and [place] descent. There is no reported Ashkenazi Jewish ancestry. There is no known  consanguinity.  GENETIC COUNSELING ASSESSMENT: Ricardo [name] is a 55 y.o. male with a personal and family history of cancer which is somewhat suggestive of a hereditary cancer syndrome and predisposition to cancer given the combination of cancer in the family. We, therefore, discussed and recommended the following at today's visit.   DISCUSSION: We discussed that 5 - 7% of colon cancer is hereditary, with most cases associated with Lynch syndrome. Tumor testing for MSI and IHC did not find concerns for Lynch syndrome at this time.  There are other genes that can be associated with hereditary colon cancer syndromes.  These include APC, MUTYH and CHEK2.  Based on the family history of breast, prostate and pancreatic cancer, there is an increased risk for hereditary breast cancer syndromes as well.  We discussed that testing is beneficial for several reasons including knowing how to follow individuals after completing their treatment, identifying whether potential treatment options such as PARP inhibitors would be beneficial, and understand if other family members could be at risk for cancer and allow them to undergo genetic testing.   We reviewed the characteristics, features and inheritance patterns of hereditary cancer syndromes. We also discussed genetic testing, including the appropriate family members to test, the process of testing, insurance coverage and turn-around-time for results. We discussed the implications of a negative, positive, carrier and/or variant of uncertain significant result. We recommended Ricardo [name] pursue genetic testing for the CancerNext-Expanded+RNA gene panel. The CancerNext-Expanded gene panel offered by [facility] and includes sequencing and rearrangement analysis for the following 77 genes: AIP, ALK, APC*, ATM*, AXIN2, BAP1, BARD1, BLM, BMPR1A, BRCA1*, BRCA2*, BRIP1*, CDC73, CDH1*, CDK4, CDKN1B, CDKN2A, CHEK2*, CTNNA1, DICER1, FANCC, FH, FLCN, GALNT12, KIF1B, LZTR1, MAX, MEN1,  MET, MLH1*, MSH2*, MSH3, MSH6*, MUTYH*, NBN, NF1*, NF2, NTHL1, PALB2*, PHOX2B, PMS2*, POT1, PRKAR1A, PTCH1, PTEN*, RAD51C*, RAD51D*, RB1, RECQL, RET, SDHA, SDHAF2, SDHB, SDHC, SDHD, SMAD4, SMARCA4, SMARCB1, SMARCE1, STK11, SUFU, TMEM127, TP53*, TSC1, TSC2, VHL and XRCC2 (sequencing and deletion/duplication); EGFR, EGLN1, HOXB13, KIT, MITF, PDGFRA, POLD1, and POLE (sequencing only); EPCAM and GREM1 (deletion/duplication only). DNA and RNA analyses performed for * genes.   Based on Ricardo [name]'s personal and family history of cancer, he meets medical criteria for genetic testing. Despite that he meets criteria, he may still have an out of pocket cost. We discussed that if his out of pocket cost for testing is over $100, the laboratory will call and confirm whether he wants to proceed with testing.  If the out of pocket cost of testing is less than $100 he  will be billed by the genetic testing laboratory.   PLAN: After considering the risks, benefits, and limitations, Ricardo [name] provided informed consent to pursue genetic testing and the blood sample was sent to [facility] for analysis of the CancerNext-Expanded+RNAinsight. Results should be available within approximately 2-3 weeks' time, at which point they will be disclosed by telephone to Ricardo [name], as will any additional recommendations warranted by these results. Ricardo [name] will receive a summary of his genetic counseling visit and a copy of his results once available. This information will also be available in Epic.   Lastly, we encouraged Ricardo [name] to remain in contact with cancer genetics annually so that we can continuously update the family history and inform him of any changes in cancer genetics and testing that may be of benefit for this family.   Ricardo [name] questions were answered to his satisfaction today. Our contact information was provided should additional questions or concerns arise. Thank you for the referral and allowing [place] to share in  the care of your patient.   Nazia Rhines P. Florene Glen, [place], [facility] Licensed, Insurance risk surveyor [email or web address] phone: [phone]  The patient was seen for a total of 45 minutes in face-to-face genetic counseling.  This patient was discussed with Drs. Magrinat, Lindi Adie and/or Burr Medico who agrees with the above.    _______________________________________________________________________ For Office Staff:  Number of people involved in session: 2 Was an Intern/ student involved with case: yes Burman Nieves

## 2020-12-26 ENCOUNTER — Encounter: Payer: Self-pay | Admitting: Nurse Practitioner

## 2020-12-26 ENCOUNTER — Other Ambulatory Visit: Payer: Self-pay | Admitting: Hematology

## 2020-12-26 ENCOUNTER — Inpatient Hospital Stay (HOSPITAL_BASED_OUTPATIENT_CLINIC_OR_DEPARTMENT_OTHER): Payer: BC Managed Care – PPO | Admitting: Nurse Practitioner

## 2020-12-26 DIAGNOSIS — C182 Malignant neoplasm of ascending colon: Secondary | ICD-10-CM

## 2020-12-26 DIAGNOSIS — C772 Secondary and unspecified malignant neoplasm of intra-abdominal lymph nodes: Secondary | ICD-10-CM | POA: Diagnosis not present

## 2020-12-26 MED ORDER — DICYCLOMINE HCL 10 MG PO CAPS: 10.0000 mg | ORAL_CAPSULE | Freq: Three times a day (TID) | ORAL | 1 refills | Status: DC

## 2020-12-26 NOTE — Progress Notes (Signed)
[place]   Telephone:([phone]) [phone] [phone]   Clinic Follow up Note   Patient Care Team: [name], MD as PCP - General (Internal Medicine) [name], MD as Consulting Physician (Oncology) [name], RN as Oncology Nurse Navigator 12/26/2020  I connected with Ricardo [name] on 12/26/20 at 9:30 AM EST by telephone visit and verified that I am speaking with the correct person using two identifiers.   I discussed the limitations, risks, security and privacy concerns of performing an evaluation and management service by telemedicine and the availability of in-person appointments. I also discussed with the patient that there may be a patient responsible charge related to this service. The patient expressed understanding and agreed to proceed.   Other persons participating in the visit and their role in the encounter: None  Patient's location: Home Provider's location: [place]: metastatic colon cancer, virtual chemo toxicity check  SUMMARY OF ONCOLOGIC HISTORY: Oncology History Overview Note  Cancer Staging No matching staging information was found for the patient.    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 Thickening of the terminal ileum with associated proximal mid to distal small bowel obstruction. Findings could be due to an ileitis versus malignancy. Nonspecific mesenteric edema could be due to engorgement versus metastases. No associated bowel perforation. Recommend endoscopy for further evaluation. 2. Indeterminate right lower quadrant lymphadenopathy. 3. Scattered colonic diverticulosis with no acute diverticulitis. 4. Stable right hepatic lobe subcentimeter hyperdensity likely represents a hepatic hemangioma. 5. No acute vascular abnormality. Aortic Atherosclerosis (ICD10-I70.0) - mild. 6. No acute intrathoracic  abnormality.   11/28/2020 Imaging   CT AP  IMPRESSION: 1. There is masslike, circumferential thickening of the terminal ileum and cecal base near the ileocecal valve and abnormally enlarged lymph nodes in the right lower quadrant mesentery adjacent to the terminal ileum measuring up to 2.3 x 1.4 cm. 2. There is extensive omental and peritoneal nodularity and caking throughout the abdomen. 3. Findings are highly concerning for primary colon malignancy with nodal and peritoneal metastatic disease. 4. Small volume perihepatic and perisplenic ascites. 5. The small bowel is generally decompressed, with full some fluid-filled, nondistended loops throughout. There is transit of oral enteric contrast to the terminal ileum. No evidence of overt bowel obstruction at this time. Esophagogastric tube is position with tip and side port below the diaphragm. 6. Atelectasis or consolidation of the dependent bilateral lung bases, new compared to prior examination.   Aortic Atherosclerosis (ICD10-I70.0).     11/29/2020 Surgery   EXPLORATORY LAPAROTOMY, PARTIAL BOWEL RESECTION, POSSIBLE OSTOMY CREATION, PERITIONEAL BIOPSY, INSERTION OF GASTROSTOMY TUBE by Dr [name]   11/29/2020 Initial Biopsy   FINAL MICROSCOPIC DIAGNOSIS:   AB. 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    COMMENT:   Immunohistochemistry (IHC) for CK20 and CDX-2 is strong and diffusely  positive.  CK7, TTF-1, Synaptophysin, Chromogranin and CD56 are  negative.  The immunophenotype is compatible with origin from the lower  gastrointestinal tract.  IHC for MMR will be reported separately.  Case  preliminarily discussed with Dr. [name] on 12/02/2020.   At the request of Dr. [name], [phone] was reviewed in retrospect.  Review of the submitted sections confirms the presence of acute  appendicitis.  No malignancy is identified.    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 He received cycle 1 FOLFOX on 12/18/20.  He felt "down" with low appetite, fatigue, and abdominal cramping on days 2 and 3.  He took nausea medication 1 time, no vomiting.  He was able to rebound and felt better after he started taking probiotic and dicyclomine specifically which "change my world."  He feels this has relieved his constipation, bloating, and abdominal discomfort/cramping, boosted his appetite, and helped him gain weight.  He has had regular bowel movements the past 2 days.  He is taking less oxycodone, only before bed and/or if he wakes up in pain.  He is able to be more active.  Cold sensitivity is still there in his mouth but resolved in his hands after about a week.  He developed chapped lips because he is a mouth breather at night, no mucositis.  Denies fever, chills, cough, chest pain, dyspnea or other new concerns.  He feels strong and happy.    MEDICAL HISTORY:  Past Medical History:  Diagnosis Date  . Arthritis   . Family history of adverse reaction to anesthesia    mother had problem with it due to her asthma  . Family history of breast cancer   . Family history of prostate cancer     SURGICAL HISTORY: Past Surgical History:  Procedure Laterality Date  . BOWEL RESECTION N/A 11/29/2020   Procedure: PARTIAL BOWEL RESECTION;  Surgeon: Jovita Kussmaul, MD;  Location: [place];  Service: General;  Laterality: N/A;  . GASTROSTOMY Left 11/29/2020   Procedure: INSERTION OF GASTROSTOMY TUBE;  Surgeon: Jovita Kussmaul, MD;  Location: [place];  Service: General;  Laterality: Left;  . IR IMAGING GUIDED PORT INSERTION  12/12/2020  . LAPAROSCOPIC APPENDECTOMY N/A 01/17/2019   Procedure: APPENDECTOMY LAPAROSCOPIC;  Surgeon: Ralene Ok, MD;  Location: [place];  Service: General;  Laterality: N/A;  . LAPAROTOMY N/A 11/29/2020   Procedure: EXPLORATORY LAPAROTOMY;  Surgeon: Jovita Kussmaul, MD;  Location: [place];  Service: General;  Laterality: N/A;  PUT CASE IN ROOM 1 STARTING AT 9:30AM FOR 120 MIN  . OSTOMY N/A 11/29/2020   Procedure: POSSIBLE OSTOMY CREATION;  Surgeon: Jovita Kussmaul, MD;  Location: [place];  Service: General;  Laterality: N/A;  . RECTAL BIOPSY N/A 11/29/2020   Procedure: PERITIONEAL BIOPSY;  Surgeon: Jovita Kussmaul, MD;  Location: [place];  Service: General;  Laterality: N/A;  . SHOULDER SURGERY Left 09/19/2015    I have reviewed the social history and family history with the patient and they are unchanged from previous note.  ALLERGIES:  has No Known Allergies.  MEDICATIONS:  Current Outpatient Medications  Medication Sig Dispense Refill  . acetaminophen (TYLENOL) 500 MG tablet Take 1,000 mg by mouth every 6 (six) hours as needed for mild pain.    Marland Kitchen dicyclomine (BENTYL) 10 MG capsule Take 1 capsule (10 mg total) by mouth 4 (  four) times daily -  before meals and at bedtime. 30 capsule 1  . lidocaine-prilocaine (EMLA) cream Apply to affected area once (Patient taking differently: Apply 1 application topically daily as needed. Apply to affected area once) 30 g 3  . ondansetron (ZOFRAN) 8 MG tablet Take 1 tablet (8 mg total) by mouth 2 (two) times daily as needed for refractory nausea / vomiting. Start on day 3 after chemotherapy. 30 tablet 1  . Oxycodone HCl 10 MG TABS Take 1 tablet (10 mg total) by mouth every 6 (six) hours as needed (pain).  60 tablet 0  . prochlorperazine (COMPAZINE) 10 MG tablet Take 1 tablet (10 mg total) by mouth every 6 (six) hours as needed for nausea or vomiting. 30 tablet 0   No current facility-administered medications for this visit.    PHYSICAL EXAMINATION: ECOG PERFORMANCE STATUS: 1 - Symptomatic but completely ambulatory  There were no vitals filed for this visit. There were no vitals filed for this visit.  Patient appears well over the phone.  Voice is strong, speech is clear.  Mood/affect appropriate.  No cough or conversational dyspnea.  LABORATORY DATA:  I have reviewed the data as listed CBC Latest Ref Rng & Units 12/25/2020 12/16/2020 12/01/2020  WBC 4.0 - 10.5 K/uL 6.4 8.1 7.5  Hemoglobin 13.0 - 17.0 g/dL 14.7 15.3 12.6(L)  Hematocrit 39.0 - 52.0 % 42.7 45.8 36.3(L)  Platelets 150 - 400 K/uL 280 380 256     CMP Latest Ref Rng & Units 12/25/2020 12/16/2020 12/01/2020  Glucose 70 - 99 mg/dL 101(H) 102(H) 83  BUN 6 - 20 mg/dL '11 12 11  ' Creatinine 0.61 - 1.24 mg/dL 1.15 1.22 1.13  Sodium 135 - 145 mmol/L 139 140 138  Potassium 3.5 - 5.1 mmol/L 3.9 4.2 3.6  Chloride 98 - 111 mmol/L 102 100 104  CO2 22 - 32 mmol/L '25 28 24  ' Calcium 8.9 - 10.3 mg/dL 9.5 9.6 8.1(L)  Total Protein 6.5 - 8.1 g/dL 8.1 7.9 -  Total Bilirubin 0.3 - 1.2 mg/dL 0.5 0.9 -  Alkaline Phos 38 - 126 U/L 104 95 -  AST 15 - 41 U/L 28 19 -  ALT 0 - 44 U/L 41 24 -      RADIOGRAPHIC STUDIES: I have personally reviewed the radiological images as listed and agreed with the findings in the report. No results found.   ASSESSMENT & PLAN: Ricardo [name] a 55 y.o.Caucasianmalewith no significant medical history.   1.Adenocarcinomaofterminal iliumand cecum withmetastatic to intra-abdominal lymph nodeand peritoneum, stage IV, MMR normal -After 8-9 weeks of intermittent but increasing lower abdominal pain, bloating and constipation he was admitted to hospital on 11/27/20. Work up showed mass in theterminal  ileumwith extensive omental nodularity and LN involvement. His baseline tumor marker CA 19-9 and CEA were normal.  -Ex lap surgery with G-tube placement with Dr [name] on 11/29/20 he was found to have diffuse peritoneal metastasis and omental biopsy confirmed poorly differentiatedadenocarcinoma with focal signet ring cellfeaturesand IHC studies support low GI primary.  This confirms stage IV metastatic cancer from cecum -He previously consented to first-line palliative chemo with FOLFOX.  If he has excellent response to treatment we may refer him for HIPEC debulking surgery in the future. -Began cycle 1 FOLFOX on 12/18/2020, tolerated well with 2 days fatigue, low po, and nausea x1. Cold sensitivity is still there on day 9, but mostly just oral. GI symptoms nearly resolved after cycle 1 chemo and starting bentyl. He recovered very  well.  -FO shows KRAS/NRAS wildtype, but given his right side colon cancer, we do not use EGFR inhibitor and instead would recommend bevacizumab. Plan to discuss at next visit and likely add with cycle 2.   2. Symptom Management: Constipation, lower abdominal pain, abdominal bloating secondary #1and partial bowl obstruction -For 8-9 weeks before hospitalization he was having increasing lower abdominal pain and constipation -He did have G-tube placed with surgery on 11/29/20. He notes/drains with bag as needed.  -Since hospital discharge he has been able to have adequate BM, usually once daily.  Started MiraLAX on 12/14/2020.  Goal to have adequate BM every 1-2 days -Recommended low residue diet with small frequent meals and nutrition supplements. -He met with dietitian -on 12/16/20 his regimen consists of oxycodone with Compazine 1-2 times daily plus MiraLAX (can increase to twice a day and add Colace if needed), which is effective at this time, no signs of recurrent bowel obstruction -since starting chemo 12/18/20 he started dicyclomine which helps tremendously. Taking less  oxycodone. Nausea, cramping, bloating, and constipation nearly resolved (12/26/20).  -Encouraged light activity such as walking  3. Genetic testing  -He has brother with prostate cancer and sister with breast cancer in their 21s. He is eligible for genetic testing. He is interested. -Consult with Roma Kayser and genetics labs on 12/25/2020  4. Social Support -He is an avid cyclist. Last long distant riding was 06/2020 across [place]. He has not biked in the 8-9 weeks before cancer diagnosis due to symptoms. His weight heavily fluctuates with changes in his activity level, diet and biking.  -He is married with 1 adult son. His brother Jenny Reichmann is in town. He works as a Designer, television/film set at [employer]. He may take time off work based on how to tolerate chemotherapy.  -I offered cone resources including chaplin and SW if needed. He notes he has good family support  -FMLA and other forms pending, will communicate with [employer]  Disposition: Mr. Justen appears stable.  S/p cycle 1 day 9 FOLFOX, he tolerated well with mildly low appetite, nausea x1, and fatigue for 2 days.  He has persistent oral cold sensitivity but managing well.  GI symptoms are much improved with dicyclomine, requiring less oxycodone.  He was able to recover, function well, and feel better overall.   Labs from 12/25/2020 reviewed, CBC and CMP are normal.  No cytopenias.  I will communicate with [employer] about FMLA and other employer forms.  The plan is for him to return for follow-up and cycle 2 on 4/21.  He knows to call sooner if he has any changes, new concerns, or questions in the interim.  The patient was provided an opportunity to ask questions and all were answered. The patient agreed with the plan and demonstrated an understanding of the instructions.   The patient was advised to call back or seek an in-person evaluation if the symptoms worsen or if the condition fails to improve as anticipated. Total non-face-to-face time was 15  minutes.     Alla Feeling, NP 12/26/20

## 2020-12-30 ENCOUNTER — Encounter: Payer: BC Managed Care – PPO | Admitting: Gastroenterology

## 2020-12-31 ENCOUNTER — Encounter: Payer: Self-pay | Admitting: Dietician

## 2020-12-31 ENCOUNTER — Telehealth: Payer: Self-pay | Admitting: *Deleted

## 2020-12-31 ENCOUNTER — Telehealth: Payer: Self-pay | Admitting: Dietician

## 2020-12-31 NOTE — Telephone Encounter (Signed)
Collaborative connected with patient to assist with type of leave and date information to complete forms this morning.   Forms ready for patient pick-up at appointment registration desk.     Envelope contains [employer] forms received 12/16/2020 for patient and spouse along with Lendon Ka. [place] disability application received 2/88/3374.  4-13/2022 requested return fax numbers of patient.  [name] declined stating he "personally knows and will deliver forms to the school system".       01/02/2021 appointment note added for registration staff.

## 2020-12-31 NOTE — Telephone Encounter (Signed)
Nutrition  Received return call from patient. Nutrition follow-up completed via telephone.   Patient with metastatic colon cancer s/p partial bowel resection with ostomy on 3/18. He is  receiving Folfox.  Patient reports the past 3 days have been "great" he is having regular bowel movements, appetite is good, his "eating has diversified" and thinks he has gained some weight. 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 Patient is eating every 3 hours and is eating peanut butter/jelly sandwich before going to bed. Patient reports he no longer wakes up in the middle of the night with abdominal pain. He had a lean burger from [employer] a couple of days ago, reports it was delicious and tolerated it well. Yesterday patient had coffee, waffle, eggs for breakfast, jello for snack, potato soup for lunch, a few handfuls of Cheez-its and protein drink for afternoon snack, ate the rest of his hamburger for dinner and peanut butter/jelly around 8 PM. Patient is drinking water, tea, and ginger ale. He has reduced Miralax to once daily. He has "sporadic energy" reports walking and rebuilt his bike last week.   Weight 234 lb on 4/8 down 11 lbs from 245 lb on 3/31; significant (4.5%)  Medications: Bentyl, Zofran, Oxycodone, Compazine  Labs: reviewed  Nutrition diagnosis: Food and nutrition knowledge deficit improved  Intervention: Continue small frequent meals and snacks  Continue daily Ensure for added calories and protein Reviewed low residue diet Continue Bentyl Continue Miralax as needed for constipation Patient has contact information  Monitoring, Evaluation, Goal: weight trends, oral intake  Next Visit: Thursday May 5 in infusion

## 2020-12-31 NOTE — Progress Notes (Signed)
Nutrition  Attempted to contact patient via telephone for nutrition follow-up. Patient did not answer, voicemail left with request for return call. Contact information provided. Patient has been scheduled for nutrition follow-up on May 5 while in infusion.   Lajuan Lines, RD, [facility] [phone]

## 2021-01-01 NOTE — Progress Notes (Signed)
[facility]   [phone] Fax:([phone]) [phone]   Clinic Follow up Note   Patient Care Team: [name], MD as PCP - General (Internal Medicine) [name], MD as Consulting Physician (Oncology) [name], RN as Oncology Nurse Navigator  Date of Service:  01/02/2021  CHIEF COMPLAINT: f/u of metastatic colon cancer  SUMMARY OF ONCOLOGIC HISTORY: Oncology History Overview Note  Cancer Staging No matching staging information was found for the patient.    metastatic colon cancer  11/27/2020 Imaging   CT Angio CAP  IMPRESSION: 1. Thickening of the terminal ileum with associated proximal mid to distal small bowel obstruction. Findings could be due to an ileitis versus malignancy. Nonspecific mesenteric edema could be due to engorgement versus metastases. No associated bowel perforation. Recommend endoscopy for further evaluation. 2. Indeterminate right lower quadrant lymphadenopathy. 3. Scattered colonic diverticulosis with no acute diverticulitis. 4. Stable right hepatic lobe subcentimeter hyperdensity likely represents a hepatic hemangioma. 5. No acute vascular abnormality. Aortic Atherosclerosis (ICD10-I70.0) - mild. 6. No acute intrathoracic abnormality.   11/28/2020 Imaging   CT AP  IMPRESSION: 1. There is masslike, circumferential thickening of the terminal ileum and cecal base near the ileocecal valve and abnormally enlarged lymph nodes in the right lower quadrant mesentery adjacent to the terminal ileum measuring up to 2.3 x 1.4 cm. 2. There is extensive omental and peritoneal nodularity and caking throughout the abdomen. 3. Findings are highly concerning for primary colon malignancy with nodal and peritoneal metastatic disease. 4. Small volume perihepatic and perisplenic ascites. 5. The small bowel is generally decompressed, with full some fluid-filled, nondistended loops throughout. There is transit of oral enteric  contrast to the terminal ileum. No evidence of overt bowel obstruction at this time. Esophagogastric tube is position with tip and side port below the diaphragm. 6. Atelectasis or consolidation of the dependent bilateral lung bases, new compared to prior examination.   Aortic Atherosclerosis (ICD10-I70.0).     11/29/2020 Surgery   EXPLORATORY LAPAROTOMY, PARTIAL BOWEL RESECTION, POSSIBLE OSTOMY CREATION, PERITIONEAL BIOPSY, INSERTION OF GASTROSTOMY TUBE by Dr [name]   11/29/2020 Initial Biopsy   FINAL MICROSCOPIC DIAGNOSIS:   AB. 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    COMMENT:   Immunohistochemistry (IHC) for CK20 and CDX-2 is strong and diffusely  positive.  CK7, TTF-1, Synaptophysin, Chromogranin and CD56 are  negative.  The immunophenotype is compatible with origin from the lower  gastrointestinal tract.  IHC for MMR will be reported separately.  Case  preliminarily discussed with Dr. [name] on 12/02/2020.   At the request of Dr. [name], [phone] was reviewed in retrospect.  Review of the submitted sections confirms the presence of acute  appendicitis.  No malignancy is identified.    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 She was last seen by me on 12/04/20 for consultation, and by NP Lacie on 4/4 and 4/14 in the interim. He was evaluated in infusion. He reports he has felt great the last 5 days. He denies any nausea. He experienced some cold sensitivity in the first week, but it was tolerable. He has been eating very well. He states he is feeling much better than before chemo. 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 He denies drainage.  REVIEW OF SYSTEMS:   Constitutional: Denies fevers, chills or abnormal weight loss Eyes: Denies blurriness of vision Ears, nose, mouth, throat, and face: Denies mucositis or sore throat Respiratory: Denies cough, dyspnea or wheezes Cardiovascular: Denies palpitation, chest discomfort or lower extremity swelling Gastrointestinal:  Denies nausea, heartburn or change in bowel habits Skin: Denies abnormal skin rashes Lymphatics: Denies new lymphadenopathy or easy bruising Neurological:Denies numbness, tingling or new weaknesses Behavioral/Psych: Mood is stable, no new changes  All other systems were reviewed with the patient and are negative.  MEDICAL HISTORY:  Past Medical History:  Diagnosis Date  . Arthritis   . Family history of adverse reaction to anesthesia    mother had problem with it due to her asthma  . Family history of breast cancer   . Family history of prostate cancer     SURGICAL HISTORY: Past Surgical History:  Procedure Laterality Date  . BOWEL RESECTION N/A 11/29/2020   Procedure: PARTIAL BOWEL RESECTION;  Surgeon: Jovita Kussmaul, MD;  Location: [facility];  Service: General;  Laterality: N/A;  . GASTROSTOMY Left 11/29/2020   Procedure: INSERTION OF GASTROSTOMY TUBE;  Surgeon: Jovita Kussmaul, MD;  Location: [facility];   Service: General;  Laterality: Left;  . IR IMAGING GUIDED PORT INSERTION  12/12/2020  . LAPAROSCOPIC APPENDECTOMY N/A 01/17/2019   Procedure: APPENDECTOMY LAPAROSCOPIC;  Surgeon: Ralene Ok, MD;  Location: [facility];  Service: General;  Laterality: N/A;  . LAPAROTOMY N/A 11/29/2020   Procedure: EXPLORATORY LAPAROTOMY;  Surgeon: Jovita Kussmaul, MD;  Location: [facility];  Service: General;  Laterality: N/A;  PUT CASE IN ROOM 1 STARTING AT 9:30AM FOR 120 MIN  . OSTOMY N/A 11/29/2020   Procedure: POSSIBLE OSTOMY CREATION;  Surgeon: Jovita Kussmaul, MD;  Location: [facility];  Service: General;  Laterality: N/A;  . RECTAL BIOPSY N/A 11/29/2020   Procedure: PERITIONEAL BIOPSY;  Surgeon: Jovita Kussmaul, MD;  Location: [facility];  Service: General;  Laterality: N/A;  . SHOULDER SURGERY Left 09/19/2015    I have reviewed the social history and family history with the patient and they are unchanged from previous note.  ALLERGIES:  has No Known Allergies.  MEDICATIONS:  Current Outpatient Medications  Medication Sig Dispense Refill  . acetaminophen (TYLENOL) 500 MG tablet Take 1,000 mg by mouth every 6 (six) hours as needed for mild pain.    Marland Kitchen dicyclomine (BENTYL) 10 MG capsule Take 1 capsule (10 mg total) by mouth 4 (four) times daily -  before meals and at bedtime. 30 capsule 1  . lidocaine-prilocaine (EMLA) cream Apply to affected area once (Patient taking differently: Apply 1 application topically daily as needed. Apply to affected area once) 30 g 3  .  ondansetron (ZOFRAN) 8 MG tablet Take 1 tablet (8 mg total) by mouth 2 (two) times daily as needed for refractory nausea / vomiting. Start on day 3 after chemotherapy. 30 tablet 1  . Oxycodone HCl 10 MG TABS Take 1 tablet (10 mg total) by mouth every 6 (six) hours as needed (pain). 60 tablet 0  . prochlorperazine (COMPAZINE) 10 MG tablet Take 1 tablet (10 mg total) by mouth every 6 (six) hours as needed for nausea or vomiting. 30 tablet 0   No current  facility-administered medications for this visit.   Facility-Administered Medications Ordered in Other Visits  Medication Dose Route Frequency Provider Last Rate Last Admin  . fluorouracil (ADRUCIL) 5,700 mg in sodium chloride 0.9 % 136 mL chemo infusion  2,400 mg/m2 (Treatment Plan Recorded) Intravenous 1 day or 1 dose [name], MD   5,700 mg at 01/02/21 1314  . heparin lock flush 100 unit/mL  500 Units Intracatheter Once PRN [name], MD      . sodium chloride flush (NS) 0.9 % injection 10 mL  10 mL Intracatheter PRN [name], MD        PHYSICAL EXAMINATION: ECOG PERFORMANCE STATUS: 1 - Symptomatic but completely ambulatory  Vitals:   01/02/21 0923  BP: 111/85  Pulse: 96  Resp: 15  Temp: 97.7 F (36.5 C)  SpO2: 99%   Filed Weights   01/02/21 0923  Weight: 231 lb 6.4 oz (105 kg)    GENERAL:alert, no distress and comfortable SKIN: skin color, texture, turgor are normal, no rashes or significant lesions EYES: normal, Conjunctiva are pink and non-injected, sclera clear  LUNGS: normal breathing effort ABDOMEN:abdomen soft, mildly distended, non-tender and normal bowel sounds, (+) G tube on left aide with stitches off  Musculoskeletal:no cyanosis of digits and no clubbing  NEURO: alert & oriented x 3 with fluent speech, no focal motor/sensory deficits  LABORATORY DATA:  I have reviewed the data as listed CBC Latest Ref Rng & Units 01/02/2021 12/25/2020 12/16/2020  WBC 4.0 - 10.5 K/uL 7.2 6.4 8.1  Hemoglobin 13.0 - 17.0 g/dL 14.1 14.7 15.3  Hematocrit 39.0 - 52.0 % 41.1 42.7 45.8  Platelets 150 - 400 K/uL 289 280 380     CMP Latest Ref Rng & Units 01/02/2021 12/25/2020 12/16/2020  Glucose 70 - 99 mg/dL 97 101(H) 102(H)  BUN 6 - 20 mg/dL '13 11 12  ' Creatinine 0.61 - 1.24 mg/dL 0.98 1.15 1.22  Sodium 135 - 145 mmol/L 140 139 140  Potassium 3.5 - 5.1 mmol/L 4.4 3.9 4.2  Chloride 98 - 111 mmol/L 103 102 100  CO2 22 - 32 mmol/L '25 25 28  ' Calcium 8.9 - 10.3 mg/dL 9.4 9.5 9.6   Total Protein 6.5 - 8.1 g/dL 7.6 8.1 7.9  Total Bilirubin 0.3 - 1.2 mg/dL 0.4 0.5 0.9  Alkaline Phos 38 - 126 U/L 101 104 95  AST 15 - 41 U/L '23 28 19  ' ALT 0 - 44 U/L 36 41 24      RADIOGRAPHIC STUDIES: I have personally reviewed the radiological images as listed and agreed with the findings in the report. No results found.   ASSESSMENT & PLAN:  Ricardo [name] is a 55 y.o. male with   1.Adenocarcinomaofterminal iliumand cecum withmetastatic to intra-abdominal lymph nodeand peritoneum, stage IV,MMR normal -After 8-9 weeks of intermittent but increasing lower abdominal pain, bloating and constipation he was admitted to hospital on 11/27/20. Work up showed mass in theterminal ileumwith extensive omental nodularity and LN  involvement. His baseline tumor marker CA 19-9and CEAwerenormal.  -Exlapsurgery with G-tube placementwith Dr [name] on 3/18/22he was found to have diffuse peritoneal metastasis and omental biopsy confirmed poorly differentiatedadenocarcinoma with focal signet ring cellfeaturesand IHC studies support low GI primary.This confirms stage IV metastatic cancer from cecum -He previously consented to first-line palliative chemo with FOLFOX. If he has excellent response to treatment we may refer him for HIPECdebulking surgery in the future. -Began cycle 1 FOLFOX on 12/18/2020, has tolerated well and feels better -FO shows KRAS/NRAS wildtype, but given his right side colon cancer, we do not use EGFR inhibitor in first line. I discussed the results with pt in detail and gave him a copy of the report -I recommend adding bevacizumab to chemo, potential side effects, which includes but not limited to, hypertension, proteinuria, small risk of bleeding and thrombosis, bowel perforation, renal dysfunction, were discussed with him in detail.  He agrees to proceed.  Will start today with cycle 2 chemo   2. Symptom Management: Constipation, lower abdominal pain, abdominal  bloating secondary #1and partial bowl obstruction -For 8-9 weeks before hospitalization he was having increasing lower abdominal pain and constipation -He did haveG-tubeplaced with surgery on 11/29/20. He notes/drains with bag as needed.  -Since hospital discharge he has been able to have adequate BM, usually once daily. Started MiraLAX on 12/14/2020.Goal to have adequate BM every 1-2 days -Recommended low residue diet with small frequent meals and nutrition supplements. -He met with dietitian -on 12/16/20 his regimen consists of oxycodone with Compazine 1-2 times daily plus MiraLAX (can increase to twice a day and add Colace if needed),which is effective at this time,no signs of recurrent bowel obstruction -since starting chemo 12/18/20 he started dicyclomine which helps tremendously. Taking less oxycodone. Nausea, cramping, bloating, and constipation resolved (01/02/21).  -Encouraged light activity such as walking  3. Genetic testing  -He has brother with prostate cancer and sister with breast cancer in their 6s. He is eligible for genetic testing. He is interested. -Consult with Roma Kayser and genetics labs on 12/25/2020  4. Social Support -He is an avid cyclist. Last long distant riding was 06/2020 across [place]. He has not biked in the 8-9 weeks before cancer diagnosis due to symptoms. His weight heavily fluctuates with changes in his activity level, diet and biking.  -He is married with 1 adult son. His brother Jenny Reichmann is in town. He works as a Designer, television/film set at [employer]. He may take time off work based on how to tolerate chemotherapy.  -He was previously offered [facility] resources including chaplin and SW if needed. He notes he has good family support     Plan: -Proceed with C2 FOLFOX, and add first dose bevacizumab today -Labs, C3 chemo, and f/u with NP Lacie in 2 weeks   No problem-specific Assessment & Plan notes found for this encounter.   No orders of the defined  types were placed in this encounter.  All questions were answered. The patient knows to call the clinic with any problems, questions or concerns. No barriers to learning was detected. The total time spent in the appointment was 30 minutes.     [name], MD 01/02/2021   I, Wilburn Mylar, am acting as scribe for [name], MD.   I have reviewed the above documentation for accuracy and completeness, and I agree with the above.

## 2021-01-02 ENCOUNTER — Encounter: Payer: Self-pay | Admitting: Hematology

## 2021-01-02 ENCOUNTER — Inpatient Hospital Stay (HOSPITAL_BASED_OUTPATIENT_CLINIC_OR_DEPARTMENT_OTHER): Payer: BC Managed Care – PPO | Admitting: Hematology

## 2021-01-02 ENCOUNTER — Other Ambulatory Visit: Payer: Self-pay

## 2021-01-02 ENCOUNTER — Other Ambulatory Visit: Payer: Self-pay | Admitting: Hematology

## 2021-01-02 ENCOUNTER — Inpatient Hospital Stay: Payer: BC Managed Care – PPO

## 2021-01-02 VITALS — BP 111/85 | HR 96 | Temp 97.7°F | Resp 15 | Ht 71.0 in | Wt 231.4 lb

## 2021-01-02 DIAGNOSIS — C182 Malignant neoplasm of ascending colon: Secondary | ICD-10-CM | POA: Diagnosis not present

## 2021-01-02 DIAGNOSIS — Z95828 Presence of other vascular implants and grafts: Secondary | ICD-10-CM | POA: Insufficient documentation

## 2021-01-02 DIAGNOSIS — C772 Secondary and unspecified malignant neoplasm of intra-abdominal lymph nodes: Secondary | ICD-10-CM

## 2021-01-02 DIAGNOSIS — Z5112 Encounter for antineoplastic immunotherapy: Secondary | ICD-10-CM | POA: Diagnosis not present

## 2021-01-02 LAB — CMP (CANCER CENTER ONLY)
ALT: 36 U/L (ref 0–44)
AST: 23 U/L (ref 15–41)
Albumin: 3.9 g/dL (ref 3.5–5.0)
Alkaline Phosphatase: 101 U/L (ref 38–126)
Anion gap: 12 (ref 5–15)
BUN: 13 mg/dL (ref 6–20)
CO2: 25 mmol/L (ref 22–32)
Calcium: 9.4 mg/dL (ref 8.9–10.3)
Chloride: 103 mmol/L (ref 98–111)
Creatinine: 0.98 mg/dL (ref 0.61–1.24)
GFR, Estimated: >60 mL/min (ref >60)
Glucose, Bld: 97 mg/dL (ref 70–99)
Potassium: 4.4 mmol/L (ref 3.5–5.1)
Sodium: 140 mmol/L (ref 135–145)
Total Bilirubin: 0.4 mg/dL (ref 0.3–1.2)
Total Protein: 7.6 g/dL (ref 6.5–8.1)

## 2021-01-02 LAB — CBC WITH DIFFERENTIAL (CANCER CENTER ONLY)
Abs Immature Granulocytes: 0.02 10*3/uL (ref 0.00–0.07)
Basophils Absolute: 0 10*3/uL (ref 0.0–0.1)
Basophils Relative: 0 %
Eosinophils Absolute: 0.1 10*3/uL (ref 0.0–0.5)
Eosinophils Relative: 2 %
HCT: 41.1 % (ref 39.0–52.0)
Hemoglobin: 14.1 g/dL (ref 13.0–17.0)
Immature Granulocytes: 0 %
Lymphocytes Relative: 14 %
Lymphs Abs: 1 10*3/uL (ref 0.7–4.0)
MCH: 28.8 pg (ref 26.0–34.0)
MCHC: 34.3 g/dL (ref 30.0–36.0)
MCV: 84 fL (ref 80.0–100.0)
Monocytes Absolute: 0.8 10*3/uL (ref 0.1–1.0)
Monocytes Relative: 11 %
Neutro Abs: 5.2 10*3/uL (ref 1.7–7.7)
Neutrophils Relative %: 73 %
Platelet Count: 289 10*3/uL (ref 150–400)
RBC: 4.89 MIL/uL (ref 4.22–5.81)
RDW: 12.7 % (ref 11.5–15.5)
WBC Count: 7.2 10*3/uL (ref 4.0–10.5)
nRBC: 0 % (ref 0.0–0.2)

## 2021-01-02 LAB — TOTAL PROTEIN, URINE DIPSTICK: Protein, ur: NEGATIVE mg/dL

## 2021-01-02 MED ORDER — SODIUM CHLORIDE 0.9% FLUSH
10.0000 mL | INTRAVENOUS | Status: DC | PRN
  Filled 2021-01-02: qty 10

## 2021-01-02 MED ORDER — SODIUM CHLORIDE 0.9 % IV SOLN
2400.0000 mg/m2 | INTRAVENOUS | Status: DC
  Administered 2021-01-02: 5700 mg via INTRAVENOUS
  Filled 2021-01-02: qty 114

## 2021-01-02 MED ORDER — PALONOSETRON HCL INJECTION 0.25 MG/5ML
INTRAVENOUS | Status: AC
  Filled 2021-01-02: qty 5

## 2021-01-02 MED ORDER — OXALIPLATIN CHEMO INJECTION 100 MG/20ML
85.0000 mg/m2 | Freq: Once | INTRAVENOUS | Status: AC
  Administered 2021-01-02: 200 mg via INTRAVENOUS
  Filled 2021-01-02: qty 40

## 2021-01-02 MED ORDER — PALONOSETRON HCL INJECTION 0.25 MG/5ML
0.2500 mg | Freq: Once | INTRAVENOUS | Status: AC
  Administered 2021-01-02: 0.25 mg via INTRAVENOUS

## 2021-01-02 MED ORDER — LEUCOVORIN CALCIUM INJECTION 350 MG
400.0000 mg/m2 | Freq: Once | INTRAVENOUS | Status: AC
  Administered 2021-01-02: 948 mg via INTRAVENOUS
  Filled 2021-01-02: qty 47.4

## 2021-01-02 MED ORDER — SODIUM CHLORIDE 0.9 % IV SOLN
5.0000 mg/kg | Freq: Once | INTRAVENOUS | Status: AC
  Administered 2021-01-02: 500 mg via INTRAVENOUS
  Filled 2021-01-02: qty 4

## 2021-01-02 MED ORDER — DEXTROSE 5 % IV SOLN
Freq: Once | INTRAVENOUS | Status: AC
  Filled 2021-01-02: qty 250

## 2021-01-02 MED ORDER — HEPARIN SOD (PORK) LOCK FLUSH 100 UNIT/ML IV SOLN
500.0000 [IU] | Freq: Once | INTRAVENOUS | Status: DC | PRN
  Filled 2021-01-02: qty 5

## 2021-01-02 MED ORDER — SODIUM CHLORIDE 0.9% FLUSH
10.0000 mL | Freq: Once | INTRAVENOUS | Status: AC
  Administered 2021-01-02: 10 mL
  Filled 2021-01-02: qty 10

## 2021-01-02 MED ORDER — DEXAMETHASONE SODIUM PHOSPHATE 100 MG/10ML IJ SOLN
10.0000 mg | Freq: Once | INTRAMUSCULAR | Status: AC
  Administered 2021-01-02: 10 mg via INTRAVENOUS
  Filled 2021-01-02: qty 10

## 2021-01-02 MED ORDER — SODIUM CHLORIDE 0.9 % IV SOLN
Freq: Once | INTRAVENOUS | Status: AC
Start: 2021-01-02 — End: 2021-01-02
  Filled 2021-01-02: qty 250

## 2021-01-02 NOTE — Patient Instructions (Signed)
[facility] Discharge Instructions for Patients Receiving Chemotherapy  Today you received the following chemotherapy agents: bevacizumab, oxaliplatin, leucovorin, fluorouracil   To help prevent nausea and vomiting after your treatment, we encourage you to take your nausea medication as needed. Don't take zofran for 3 days following treatment. If you need medication for nausea before the 3 days, take compazine instead.   If you develop nausea and vomiting that is not controlled by your nausea medication, call the clinic.   BELOW ARE SYMPTOMS THAT SHOULD BE REPORTED IMMEDIATELY:  *FEVER GREATER THAN 100.5 F  *CHILLS WITH OR WITHOUT FEVER  NAUSEA AND VOMITING THAT IS NOT CONTROLLED WITH YOUR NAUSEA MEDICATION  *UNUSUAL SHORTNESS OF BREATH  *UNUSUAL BRUISING OR BLEEDING  TENDERNESS IN MOUTH AND THROAT WITH OR WITHOUT PRESENCE OF ULCERS  *URINARY PROBLEMS  *BOWEL PROBLEMS  UNUSUAL RASH Items with * indicate a potential emergency and should be followed up as soon as possible.  Feel free to call the clinic should you have any questions or concerns. The clinic phone number is [phone].  Please show the [facility] at check-in to the Emergency Department and triage nurse.  Bevacizumab injection What is this medicine? BEVACIZUMAB (be va SIZ yoo mab) is a monoclonal antibody. It is used to treat many types of cancer. This medicine may be used for other purposes; ask your health care provider or pharmacist if you have questions. COMMON BRAND NAME(S): Avastin, MVASI, Zirabev What should I tell my health care provider before I take this medicine? They need to know if you have any of these conditions:  diabetes  heart disease  high blood pressure  history of coughing up blood  prior anthracycline chemotherapy (e.g., doxorubicin, daunorubicin, epirubicin)  recent or ongoing radiation therapy  recent or planning to have surgery  stroke  an unusual or  allergic reaction to bevacizumab, hamster proteins, mouse proteins, other medicines, foods, dyes, or preservatives  pregnant or trying to get pregnant  breast-feeding How should I use this medicine? This medicine is for infusion into a vein. It is given by a health care professional in a hospital or clinic setting. Talk to your pediatrician regarding the use of this medicine in children. Special care may be needed. Overdosage: If you think you have taken too much of this medicine contact a poison control center or emergency room at once. NOTE: This medicine is only for you. Do not share this medicine with others. What if I miss a dose? It is important not to miss your dose. Call your doctor or health care professional if you are unable to keep an appointment. What may interact with this medicine? Interactions are not expected. This list may not describe all possible interactions. Give your health care provider a list of all the medicines, herbs, non-prescription drugs, or dietary supplements you use. Also tell them if you smoke, drink alcohol, or use illegal drugs. Some items may interact with your medicine. What should I watch for while using this medicine? Your condition will be monitored carefully while you are receiving this medicine. You will need important blood work and urine testing done while you are taking this medicine. This medicine may increase your risk to bruise or bleed. Call your doctor or health care professional if you notice any unusual bleeding. Before having surgery, talk to your health care provider to make sure it is ok. This drug can increase the risk of poor healing of your surgical site or wound. You will  need to stop this drug for 28 days before surgery. After surgery, wait at least 28 days before restarting this drug. Make sure the surgical site or wound is healed enough before restarting this drug. Talk to your health care provider if questions. Do not become pregnant  while taking this medicine or for 6 months after stopping it. Women should inform their doctor if they wish to become pregnant or think they might be pregnant. There is a potential for serious side effects to an unborn child. Talk to your health care professional or pharmacist for more information. Do not breast-feed an infant while taking this medicine and for 6 months after the last dose. This medicine has caused ovarian failure in some women. This medicine may interfere with the ability to have a child. You should talk to your doctor or health care professional if you are concerned about your fertility. What side effects may I notice from receiving this medicine? Side effects that you should report to your doctor or health care professional as soon as possible:  allergic reactions like skin rash, itching or hives, swelling of the face, lips, or tongue  chest pain or chest tightness  chills  coughing up blood  high fever  seizures  severe constipation  signs and symptoms of bleeding such as bloody or black, tarry stools; red or dark-brown urine; spitting up blood or brown material that looks like coffee grounds; red spots on the skin; unusual bruising or bleeding from the eye, gums, or nose  signs and symptoms of a blood clot such as breathing problems; chest pain; severe, sudden headache; pain, swelling, warmth in the leg  signs and symptoms of a stroke like changes in vision; confusion; trouble speaking or understanding; severe headaches; sudden numbness or weakness of the face, arm or leg; trouble walking; dizziness; loss of balance or coordination  stomach pain  sweating  swelling of legs or ankles  vomiting  weight gain Side effects that usually do not require medical attention (report to your doctor or health care professional if they continue or are bothersome):  back pain  changes in taste  decreased appetite  dry skin  nausea  tiredness This list may not  describe all possible side effects. Call your doctor for medical advice about side effects. You may report side effects to FDA at 1-800-FDA-1088. Where should I keep my medicine? This drug is given in a hospital or clinic and will not be stored at home. NOTE: This sheet is a summary. It may not cover all possible information. If you have questions about this medicine, talk to your doctor, pharmacist, or health care provider.  2021 Elsevier/Gold Standard (2019-06-28 10:50:46)

## 2021-01-03 ENCOUNTER — Emergency Department (HOSPITAL_COMMUNITY): Payer: BC Managed Care – PPO

## 2021-01-03 ENCOUNTER — Encounter (HOSPITAL_COMMUNITY): Payer: Self-pay | Admitting: Emergency Medicine

## 2021-01-03 ENCOUNTER — Other Ambulatory Visit: Payer: Self-pay

## 2021-01-03 ENCOUNTER — Observation Stay (HOSPITAL_COMMUNITY)
Admission: EM | Admit: 2021-01-03 | Discharge: 2021-01-04 | Disposition: A | Discharge status: Home / Self Care | Payer: BC Managed Care – PPO | Attending: Internal Medicine | Admitting: Internal Medicine

## 2021-01-03 DIAGNOSIS — Z20822 Contact with and (suspected) exposure to covid-19: Secondary | ICD-10-CM | POA: Diagnosis not present

## 2021-01-03 DIAGNOSIS — E872 Acidosis: Secondary | ICD-10-CM | POA: Diagnosis not present

## 2021-01-03 DIAGNOSIS — R109 Unspecified abdominal pain: Secondary | ICD-10-CM | POA: Diagnosis not present

## 2021-01-03 DIAGNOSIS — R112 Nausea with vomiting, unspecified: Secondary | ICD-10-CM | POA: Diagnosis not present

## 2021-01-03 DIAGNOSIS — E8729 Other acidosis: Secondary | ICD-10-CM | POA: Diagnosis present

## 2021-01-03 DIAGNOSIS — E860 Dehydration: Secondary | ICD-10-CM | POA: Diagnosis present

## 2021-01-03 DIAGNOSIS — D72829 Elevated white blood cell count, unspecified: Secondary | ICD-10-CM | POA: Diagnosis present

## 2021-01-03 DIAGNOSIS — N179 Acute kidney failure, unspecified: Secondary | ICD-10-CM | POA: Diagnosis not present

## 2021-01-03 HISTORY — DX: Malignant neoplasm of colon, unspecified: C18.9

## 2021-01-03 LAB — CBC WITH DIFFERENTIAL/PLATELET
Abs Immature Granulocytes: 0.06 10*3/uL (ref 0.00–0.07)
Basophils Absolute: 0.1 10*3/uL (ref 0.0–0.1)
Basophils Relative: 1 %
Eosinophils Absolute: 0.2 10*3/uL (ref 0.0–0.5)
Eosinophils Relative: 1 %
HCT: 46 % (ref 39.0–52.0)
Hemoglobin: 16 g/dL (ref 13.0–17.0)
Immature Granulocytes: 1 %
Lymphocytes Relative: 5 %
Lymphs Abs: 0.6 10*3/uL — ABNORMAL LOW (ref 0.7–4.0)
MCH: 29 pg (ref 26.0–34.0)
MCHC: 34.8 g/dL (ref 30.0–36.0)
MCV: 83.5 fL (ref 80.0–100.0)
Monocytes Absolute: 1.1 10*3/uL — ABNORMAL HIGH (ref 0.1–1.0)
Monocytes Relative: 9 %
Neutro Abs: 10.1 10*3/uL — ABNORMAL HIGH (ref 1.7–7.7)
Neutrophils Relative %: 83 %
Platelets: 359 10*3/uL (ref 150–400)
RBC: 5.51 MIL/uL (ref 4.22–5.81)
RDW: 12.8 % (ref 11.5–15.5)
WBC: 12.1 10*3/uL — ABNORMAL HIGH (ref 4.0–10.5)
nRBC: 0 % (ref 0.0–0.2)

## 2021-01-03 LAB — COMPREHENSIVE METABOLIC PANEL
ALT: 51 U/L — ABNORMAL HIGH (ref 0–44)
AST: 51 U/L — ABNORMAL HIGH (ref 15–41)
Albumin: 4.5 g/dL (ref 3.5–5.0)
Alkaline Phosphatase: 99 U/L (ref 38–126)
Anion gap: 17 — ABNORMAL HIGH (ref 5–15)
BUN: 19 mg/dL (ref 6–20)
CO2: 19 mmol/L — ABNORMAL LOW (ref 22–32)
Calcium: 9.6 mg/dL (ref 8.9–10.3)
Chloride: 102 mmol/L (ref 98–111)
Creatinine, Ser: 1.34 mg/dL — ABNORMAL HIGH (ref 0.61–1.24)
GFR, Estimated: >60 mL/min (ref >60)
Glucose, Bld: 112 mg/dL — ABNORMAL HIGH (ref 70–99)
Potassium: 4.7 mmol/L (ref 3.5–5.1)
Sodium: 138 mmol/L (ref 135–145)
Total Bilirubin: 1.7 mg/dL — ABNORMAL HIGH (ref 0.3–1.2)
Total Protein: 8.6 g/dL — ABNORMAL HIGH (ref 6.5–8.1)

## 2021-01-03 LAB — LIPASE, BLOOD: Lipase: 37 U/L (ref 11–51)

## 2021-01-03 LAB — MAGNESIUM: Magnesium: 2.2 mg/dL (ref 1.7–2.4)

## 2021-01-03 MED ORDER — HYDROMORPHONE HCL 1 MG/ML IJ SOLN
1.0000 mg | INTRAMUSCULAR | Status: DC | PRN
Start: 2021-01-03 — End: 2021-01-04
  Administered 2021-01-04: 1 mg via INTRAVENOUS
  Filled 2021-01-03: qty 1

## 2021-01-03 MED ORDER — DICYCLOMINE HCL 10 MG PO CAPS: 10.0000 mg | ORAL_CAPSULE | Freq: Three times a day (TID) | ORAL | 1 refills | Status: DC

## 2021-01-03 MED ORDER — LACTATED RINGERS IV BOLUS
1000.0000 mL | Freq: Once | INTRAVENOUS | Status: AC
  Administered 2021-01-03: 1000 mL via INTRAVENOUS

## 2021-01-03 MED ORDER — HYDROMORPHONE HCL 1 MG/ML IJ SOLN
1.0000 mg | Freq: Once | INTRAMUSCULAR | Status: AC
  Administered 2021-01-03: 1 mg via INTRAVENOUS
  Filled 2021-01-03: qty 1

## 2021-01-03 MED ORDER — LACTATED RINGERS IV SOLN: INTRAVENOUS | Status: DC

## 2021-01-03 MED ORDER — ACETAMINOPHEN 650 MG RE SUPP: 650.0000 mg | Freq: Four times a day (QID) | RECTAL | Status: DC | PRN

## 2021-01-03 MED ORDER — ENOXAPARIN SODIUM 40 MG/0.4ML ~~LOC~~ SOLN
40.0000 mg | SUBCUTANEOUS | Status: DC
  Administered 2021-01-04: 40 mg via SUBCUTANEOUS
  Filled 2021-01-03: qty 0.4

## 2021-01-03 MED ORDER — ACETAMINOPHEN 325 MG PO TABS: 650.0000 mg | ORAL_TABLET | Freq: Four times a day (QID) | ORAL | Status: DC | PRN

## 2021-01-03 MED ORDER — LORAZEPAM 2 MG/ML IJ SOLN
0.5000 mg | Freq: Four times a day (QID) | INTRAMUSCULAR | Status: DC | PRN
  Administered 2021-01-03: 0.5 mg via INTRAVENOUS
  Filled 2021-01-03: qty 1

## 2021-01-03 MED ORDER — NALOXONE HCL 0.4 MG/ML IJ SOLN: 0.4000 mg | INTRAMUSCULAR | Status: DC | PRN

## 2021-01-03 MED ORDER — LACTATED RINGERS IV BOLUS: 1000.0000 mL | Freq: Once | INTRAVENOUS | Status: DC

## 2021-01-03 MED ORDER — FENTANYL CITRATE (PF) 100 MCG/2ML IJ SOLN
50.0000 ug | Freq: Once | INTRAMUSCULAR | Status: AC
  Administered 2021-01-03: 50 ug via INTRAVENOUS
  Filled 2021-01-03: qty 2

## 2021-01-03 MED ORDER — IOHEXOL 300 MG/ML  SOLN
100.0000 mL | Freq: Once | INTRAMUSCULAR | Status: AC | PRN
  Administered 2021-01-03: 100 mL via INTRAVENOUS

## 2021-01-03 MED ORDER — ONDANSETRON HCL 4 MG/2ML IJ SOLN
4.0000 mg | Freq: Once | INTRAMUSCULAR | Status: AC
  Administered 2021-01-03: 4 mg via INTRAVENOUS
  Filled 2021-01-03: qty 2

## 2021-01-03 NOTE — ED Provider Notes (Signed)
[facility] DEPT Provider Note   CSN: [id number] Arrival date & time: 01/03/21  1831     History Chief Complaint  Patient presents with  . Emesis    Ricardo [name] is a 55 y.o. male.  Presented to ER with concern for abdominal pain nausea and vomiting.  Patient has a history of metastatic colon cancer is currently on palliative chemotherapy.  States that today he developed severe nausea vomiting and abdominal pain.  Vomit is nonbloody nonbilious.  Last bowel movement was yesterday, has passed some gas today.  HPI     Past Medical History:  Diagnosis Date  . Arthritis   . Family history of adverse reaction to anesthesia    mother had problem with it due to her asthma  . Family history of breast cancer   . Family history of prostate cancer     Patient Active Problem List   Diagnosis Date Noted  . AKI (acute kidney injury) ([place]) 01/03/2021  . Port-A-Cath in place 01/02/2021  . Family history of breast cancer   . Family history of prostate cancer   . metastatic colon cancer 12/04/2020  . 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 SBO (small bowel obstruction) ([place]) 11/27/2020  . Ileitis 11/27/2020  . Intractable abdominal pain 11/27/2020  . Nausea and vomiting 11/27/2020  . Hyperlipidemia 11/27/2020  . Lymphadenopathy, abdominal 11/27/2020  . Body mass index (BMI) of 39.0-39.9 in adult 11/10/2018  . Arthralgia 11/10/2018  . Sleep disorder 11/10/2018    Past Surgical History:  Procedure Laterality Date  . BOWEL RESECTION N/A 11/29/2020   Procedure: PARTIAL BOWEL RESECTION;  Surgeon: Jovita Kussmaul, MD;  Location: [place] [place] [facility];  Service: General;  Laterality: N/A;  . GASTROSTOMY Left 11/29/2020   Procedure: INSERTION OF GASTROSTOMY TUBE;  Surgeon: Jovita Kussmaul, MD;  Location: [facility];  Service: General;  Laterality: Left;  . IR IMAGING GUIDED PORT INSERTION  12/12/2020  . LAPAROSCOPIC APPENDECTOMY N/A 01/17/2019   Procedure: APPENDECTOMY  LAPAROSCOPIC;  Surgeon: Ralene Ok, MD;  Location: [facility];  Service: General;  Laterality: N/A;  . LAPAROTOMY N/A 11/29/2020   Procedure: EXPLORATORY LAPAROTOMY;  Surgeon: Jovita Kussmaul, MD;  Location: [facility];  Service: General;  Laterality: N/A;  PUT CASE IN ROOM 1 STARTING AT 9:30AM FOR 120 MIN  . OSTOMY N/A 11/29/2020   Procedure: POSSIBLE OSTOMY CREATION;  Surgeon: Jovita Kussmaul, MD;  Location: [facility];  Service: General;  Laterality: N/A;  . RECTAL BIOPSY N/A 11/29/2020   Procedure: PERITIONEAL BIOPSY;  Surgeon: Jovita Kussmaul, MD;  Location: [facility];  Service: General;  Laterality: N/A;  . SHOULDER SURGERY Left 09/19/2015       Family History  Problem Relation Age of Onset  . Cancer Sister 36       breast cancer  . Cancer Brother 55       prostate cancer   . High Cholesterol Brother   . Pancreatic cancer Maternal Uncle   . Bone cancer Cousin        pat first cousin  . Colon cancer Neg Hx   . Liver disease Neg Hx   . Esophageal cancer Neg Hx   . Stomach cancer Neg Hx     Social History   Tobacco Use  . Smoking status: Never Smoker  . Smokeless tobacco: Never Used  Vaping Use  . Vaping Use: Never used  Substance Use Topics  . Alcohol use: Not Currently    Comment:  rare  . Drug use: Never    Home Medications Prior to Admission medications   Medication Sig Start Date End Date Taking? Authorizing Provider  acetaminophen (TYLENOL) 500 MG tablet Take 1,000 mg by mouth every 6 (six) hours as needed for mild pain.   Yes [provider]  dicyclomine (BENTYL) 10 MG capsule Take 1 capsule (10 mg total) by mouth 4 (four) times daily -  before meals and at bedtime. 01/03/21  Yes [name], MD  Oxycodone HCl 10 MG TABS Take 1 tablet (10 mg total) by mouth every 6 (six) hours as needed (pain). 12/23/20  Yes Alla Feeling, NP  prochlorperazine (COMPAZINE) 10 MG tablet Take 1 tablet (10 mg total) by mouth every 6 (six) hours as needed for nausea or vomiting. 12/04/20  Yes  [name], MD  lidocaine-prilocaine (EMLA) cream Apply to affected area once Patient taking differently: Apply 1 application topically daily as needed. Apply to affected area once 12/04/20   [name], MD  ondansetron (ZOFRAN) 8 MG tablet Take 1 tablet (8 mg total) by mouth 2 (two) times daily as needed for refractory nausea / vomiting. Start on day 3 after chemotherapy. 12/04/20   [name], MD    Allergies    Patient has no known allergies.  Review of Systems   Review of Systems  Constitutional: Negative for chills and fever.  HENT: Negative for ear pain and sore throat.   Eyes: Negative for pain and visual disturbance.  Respiratory: Negative for cough and shortness of breath.   Cardiovascular: Negative for chest pain and palpitations.  Gastrointestinal: Positive for abdominal pain, nausea and vomiting.  Genitourinary: Negative for dysuria and hematuria.  Musculoskeletal: Negative for arthralgias and back pain.  Skin: Negative for color change and rash.  Neurological: Negative for seizures and syncope.  All other systems reviewed and are negative.   Physical Exam Updated Vital Signs BP (!) 118/95   Pulse 98   Temp 98.3 F (36.8 C) (Oral)   Resp 18   SpO2 95%   Physical Exam Vitals and nursing note reviewed.  Constitutional:      Appearance: He is well-developed.  HENT:     Head: Normocephalic and atraumatic.  Eyes:     Conjunctiva/sclera: Conjunctivae normal.  Cardiovascular:     Rate and Rhythm: Regular rhythm. Tachycardia present.     Heart sounds: No murmur heard.   Pulmonary:     Effort: Pulmonary effort is normal. No respiratory distress.     Breath sounds: Normal breath sounds.  Abdominal:     Comments: Generalized tenderness to palpation, soft, no rebound or guarding midline incision is intact, healing well  Musculoskeletal:        General: No deformity or signs of injury.     Cervical back: Neck supple.  Skin:    General: Skin is warm and dry.      Capillary Refill: Capillary refill takes less than 2 seconds.  Neurological:     General: No focal deficit present.     Mental Status: He is alert.  Psychiatric:        Mood and Affect: Mood normal.        Behavior: Behavior normal.     ED Results / Procedures / Treatments   Labs (all labs ordered are listed, but only abnormal results are displayed) Labs Reviewed  CBC WITH DIFFERENTIAL/PLATELET - Abnormal; Notable for the following components:      Result Value   WBC 12.1 (*)  Neutro Abs 10.1 (*)    Lymphs Abs 0.6 (*)    Monocytes Absolute 1.1 (*)    All other components within normal limits  COMPREHENSIVE METABOLIC PANEL - Abnormal; Notable for the following components:   CO2 19 (*)    Glucose, Bld 112 (*)    Creatinine, Ser 1.34 (*)    Total Protein 8.6 (*)    AST 51 (*)    ALT 51 (*)    Total Bilirubin 1.7 (*)    Anion gap 17 (*)    All other components within normal limits  SARS CORONAVIRUS 2 (TAT 6-24 HRS)  LIPASE, BLOOD  URINALYSIS, ROUTINE W REFLEX MICROSCOPIC    EKG None  Radiology CT ABDOMEN PELVIS W CONTRAST  Result Date: 01/03/2021 CLINICAL DATA:  Acute abdominal pain. Active chemotherapy for metastatic colon cancer. EXAM: CT ABDOMEN AND PELVIS WITH CONTRAST TECHNIQUE: Multidetector CT imaging of the abdomen and pelvis was performed using the standard protocol following bolus administration of intravenous contrast. CONTRAST:  142mL OMNIPAQUE IOHEXOL 300 MG/ML  SOLN COMPARISON:  Most recent CT 11/28/2020 FINDINGS: Lower chest: Linear atelectasis in the lingula and both lower lobes. No pleural fluid. Normal heart size. Hepatobiliary: Focal liver lesion. Gallbladder physiologically distended, no calcified stone. No biliary dilatation. Pancreas: No ductal dilatation or inflammation. Spleen: Normal in size without focal abnormality. Adrenals/Urinary Tract: No adrenal nodule. No hydronephrosis or perinephric edema. Homogeneous renal enhancement with symmetric  excretion on delayed phase imaging. Urinary bladder is physiologically distended without wall thickening. Stomach/Bowel: Gastrostomy tube in place, balloon normally positioned in the stomach. Moderate intraluminal fluid in the stomach. No gastric thickening. Fluid-filled loops of small bowel in the lower abdomen and pelvis which are prominence but no evidence of obstruction. There is wall thickening of the distal ileum. Fluid/liquid stool throughout the colon, primarily in the right and transverse colon. Sigmoid colonic diverticulosis. Mild sigmoid wall thickening in the region of diverticula but no focally inflamed diverticulitis. No evidence of colonic obstruction. No bowel pneumatosis. Appendix surgically absent. Vascular/Lymphatic: Aortic atherosclerosis. Right lower quadrant mesenteric adenopathy similar or mildly improved, short axis lymph node of 11 mm series 2, image 49. Patent portal and mesenteric veins. Reproductive: Prostate is unremarkable. Other: Small volume perihepatic and perisplenic ascites, slightly improved from prior. Omental and peritoneal nodularity involving the anterior abdomen, with slight improvement. Recent midline laparotomy changes. Ventral abdominal wall hernia, minimal fat in the inguinal canals. No free air or abscess. Musculoskeletal: Chronic bilateral L5 pars interarticularis defects with trace anterolisthesis of L5 on S1. 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 IMPRESSION: 1. Wall thickening of the terminal ileum again seen. Fluid-filled distal small bowel, ascending, and transverse colon without obstruction. 2. Equivocal improvement in omental and peritoneal metastatic disease with slightly improved small volume perihepatic and perisplenic ascites. Right lower quadrant mesenteric adenopathy is similar or mildly improved. 3. Sigmoid colonic diverticulosis. Mild mural wall thickening in the sigmoid, no  acute diverticulitis. 4. Gastrostomy tube in place, balloon normally positioned in the stomach. 5. Chronic bilateral L5 pars interarticularis defects with trace anterolisthesis of L5 on S1. Chronic avascular necrosis of the left femoral head. Aortic Atherosclerosis (ICD10-I70.0). Electronically Signed   By: Keith Rake M.D.   On: 01/03/2021 22:22    Procedures Procedures   Medications Ordered in ED Medications  lactated ringers infusion (has no administration in time range)  lactated ringers bolus 1,000 mL (0 mLs Intravenous Stopped 01/03/21 2025)  fentaNYL (SUBLIMAZE)  injection 50 mcg (50 mcg Intravenous Given 01/03/21 1929)  ondansetron (ZOFRAN) injection 4 mg (4 mg Intravenous Given 01/03/21 1928)  HYDROmorphone (DILAUDID) injection 1 mg (1 mg Intravenous Given 01/03/21 2025)  iohexol (OMNIPAQUE) 300 MG/ML solution 100 mL (100 mLs Intravenous Contrast Given 01/03/21 2151)  HYDROmorphone (DILAUDID) injection 1 mg (1 mg Intravenous Given 01/03/21 2207)    ED Course  I have reviewed the triage vital signs and the nursing notes.  Pertinent labs & imaging results that were available during my care of the patient were reviewed by me and considered in my medical decision making (see chart for details).    MDM Rules/Calculators/A&P                         55 year old male history of metastatic colon cancer presenting to ER with concern for abdominal pain nausea and vomiting.  On exam patient appears uncomfortable, mildly tachycardic.  Labs noted for borderline AKI, bicarb 19, mildly elevated anion gap, findings consistent with dehydration.  CT abdomen pelvis demonstrated fluid-filled distal small bowel ascending and transverse colon without evidence for obstruction.  Suspect symptoms may be related to his underlying cancer versus chemo treatment side effects.  Patient requiring multiple doses of IV pain and nausea medicine.  Believe he would benefit from admission for further observation and  symptom control.  Discussed case with hospitalist service who will admit.  Final Clinical Impression(s) / ED Diagnoses Final diagnoses:  Intractable nausea and vomiting  Intractable abdominal pain    Rx / DC Orders ED Discharge Orders    None       Lucrezia Starch, MD 01/03/21 2254

## 2021-01-03 NOTE — H&P (Signed)
History and Physical    PLEASE NOTE THAT DRAGON DICTATION SOFTWARE WAS USED IN THE CONSTRUCTION OF THIS NOTE.   Ricardo [name] [id number] DOB: [birth year] DOA: 01/03/2021  PCP: [name], MD Patient coming from: home   I have personally briefly reviewed patient's old medical records in [facility]  Chief Complaint: Abdominal pain  HPI: Ricardo [name] is a 55 y.o. male with medical history significant for metastatic colon cancer status post partial bowel resection in March 2022 and subsequently on palliative chemotherapy, who is admitted to [facility] on 01/03/2021 with acute kidney injury after presenting from home to [facility] ED complaining of abdominal pain.   The patient reports 2 days of progressive abdominal discomfort, which has been poorly controlled with his home as needed oxycodone.  He conveys that he abdominal discomfort has been generalized in distribution and nearly constant over the last 2 days, with worsening of sharp discomfort with palpation over the abdomen, with vomiting, and with attempts to move.  No preceding trauma.  He notes associated intermittent nausea resulting in at least 5-6 episodes of nonbloody, nonbilious emesis over the last 2 days.  Denies any associated diarrhea, melena, or hematochezia.  Continues to produce flatus.  Denies any associated subjective fever, chills, rigors, or generalized myalgias.  Not associate with any acute rash.  No recent traveling or known COVID-19 exposures.  Denies any overt chest pain, shortness of breath, palpitations, diaphoresis, presyncope, or syncope.  Also denies any acute dysuria, gross hematuria, or change in urinary urgency/frequency.  Overall, over the last 2 days, the setting of his abdominal pain as well as intermittent nausea/vomiting, the patient reports significant decline in his oral intake over that time including limited ability to tolerate his as needed oxycodone to manage his underlying abdominal  discomfort, noting that he is vomited on both occasions been trying to take this medication over that timeframe.  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  He underwent partial small bowel resection on 11/29/2020 as well as gastrostomy tube placement at that time.  Subsequently, he has been initiated on palliative chemotherapy.     ED Course:  Vital signs in the ED were notable for the following:  -Temperature max 98.3, heart rate 88-1 03; blood pressure 116/91 129/93; respiratory rate 17-22, oxygen saturation 94 to 100% on room air.  Labs were notable for the following: CMP was notable for the following: Sodium 138, potassium 4.7, bicarbonate 19, anion gap 17, BUN 19, creatinine 1.34 relative to most recent prior value of 0.98 on 01/02/2021, glucose 112, alkaline phosphatase 99, AST 51, ALT 51, total bilirubin 1.7.  These liver enzymes are relative to liver enzymes performed 2 days prior, which were notable for the following: Alkaline phosphatase 101, AST 23, ALT 36, total bilirubin 0.4.  Lipase checked today was found to be 37.  Urinalysis has been ordered, with result currently pending.  CBC was notable for white blood cell count of 12,000 with 83% neutrophils.  Nasopharyngeal COVID-19 PCR was performed in the ED today, with result currently pending.  CT abdomen/pelvis with contrast, as compared to most recent prior CT abdomen/pelvis performed on 11/28/2020, showed stable appearing wall thickening of the terminal ileum; and showed fluid-filled distal small bowel, ascending and transverse colon without evidence of obstruction, abscess, or perforation.  Additionally, today CT abdomen/pelvis showed equivocal improvement in the omental and peritoneal metastatic disease in the  interval, with slightly improved to small volume perihepatic and perisplenic ascites.  Sigmoid colonic diverticulosis without acute  diverticulitis.  Additionally, today CT abdomen/pelvis showed gastrostomy tube in place.   While in the ED, the following were administered: Fentanyl 50 mcg IV x1, Dilaudid 1 mg IV x1, Zofran 4 mg IV x1, and lactated Ringer's x1 L bolus.  In the setting of the patient's ongoing nausea and abdominal discomfort and spite of these measures provided in the ED, he was subsequently admitted for overnight observation to the [place] floor for further evaluation and management of presenting acute kidney injury as well as improved symptomatic management in the setting of presenting abdominal pain as well as nausea/vomiting.     Review of Systems: As per HPI otherwise 10 point review of systems negative.   Past Medical History:  Diagnosis Date  . Arthritis   . Family history of adverse reaction to anesthesia    mother had problem with it due to her asthma  . Family history of breast cancer   . Family history of prostate cancer     Past Surgical History:  Procedure Laterality Date  . BOWEL RESECTION N/A 11/29/2020   Procedure: PARTIAL BOWEL RESECTION;  Surgeon: Jovita Kussmaul, MD;  Location: [place];  Service: General;  Laterality: N/A;  . GASTROSTOMY Left 11/29/2020   Procedure: INSERTION OF GASTROSTOMY TUBE;  Surgeon: Jovita Kussmaul, MD;  Location: [place];  Service: General;  Laterality: Left;  . IR IMAGING GUIDED PORT INSERTION  12/12/2020  . LAPAROSCOPIC APPENDECTOMY N/A 01/17/2019   Procedure: APPENDECTOMY LAPAROSCOPIC;  Surgeon: Ralene Ok, MD;  Location: [place];  Service: General;  Laterality: N/A;  . LAPAROTOMY N/A 11/29/2020   Procedure: EXPLORATORY LAPAROTOMY;  Surgeon: Jovita Kussmaul, MD;  Location: [place];  Service: General;  Laterality: N/A;  PUT CASE IN ROOM 1 STARTING AT 9:30AM FOR 120 MIN  . OSTOMY N/A 11/29/2020   Procedure: POSSIBLE OSTOMY CREATION;  Surgeon: Jovita Kussmaul, MD;  Location: [place];  Service: General;  Laterality: N/A;  . RECTAL BIOPSY N/A 11/29/2020   Procedure:  PERITIONEAL BIOPSY;  Surgeon: Jovita Kussmaul, MD;  Location: [place];  Service: General;  Laterality: N/A;  . SHOULDER SURGERY Left 09/19/2015    Social History:  reports that he has never smoked. He has never used smokeless tobacco. He reports previous alcohol use. He reports that he does not use drugs.   No Known Allergies  Family History  Problem Relation Age of Onset  . Cancer Sister 35       breast cancer  . Cancer Brother 75       prostate cancer   . High Cholesterol Brother   . Pancreatic cancer Maternal Uncle   . Bone cancer Cousin        pat first cousin  . Colon cancer Neg Hx   . Liver disease Neg Hx   . Esophageal cancer Neg Hx   . Stomach cancer Neg Hx       Prior to Admission medications   Medication Sig Start Date End Date Taking? Authorizing Provider  acetaminophen (TYLENOL) 500 MG tablet Take 1,000 mg by mouth every 6 (six) hours as needed for mild pain.   Yes [provider]  dicyclomine (BENTYL) 10 MG capsule Take 1 capsule (10 mg total) by mouth 4 (four) times daily -  before meals and at bedtime. 01/03/21  Yes [name], MD  Oxycodone HCl 10 MG TABS Take 1 tablet (10  mg total) by mouth every 6 (six) hours as needed (pain). 12/23/20  Yes Alla Feeling, NP  prochlorperazine (COMPAZINE) 10 MG tablet Take 1 tablet (10 mg total) by mouth every 6 (six) hours as needed for nausea or vomiting. 12/04/20  Yes [name], MD  lidocaine-prilocaine (EMLA) cream Apply to affected area once Patient taking differently: Apply 1 application topically daily as needed. Apply to affected area once 12/04/20   [name], MD  ondansetron (ZOFRAN) 8 MG tablet Take 1 tablet (8 mg total) by mouth 2 (two) times daily as needed for refractory nausea / vomiting. Start on day 3 after chemotherapy. 12/04/20   [name], MD     Objective    Physical Exam: Vitals:   01/03/21 2030 01/03/21 2100 01/03/21 2130 01/03/21 2206  BP: (!) 129/95 (!) 121/94 (!) 118/96 (!) 118/95  Pulse: 90  (!) 102 (!) 103 98  Resp: (!) 9 16 15 18   Temp:      TempSrc:      SpO2: 92% 92% 95% 95%    General: appears to be stated age; alert, oriented Skin: warm, dry, no rash Head:  AT/[place] Mouth:  Oral mucosa membranes appear dry, normal dentition Neck: supple; trachea midline Heart:  RRR; did not appreciate any M/R/G Lungs: CTAB, did not appreciate any wheezes, rales, or rhonchi Abdomen: + BS; soft, ND, G-tube noted; mild diffuse tenderness to palpation in the absence of any associated guarding, rigidity, or rebound tenderness. Vascular: 2+ pedal pulses b/l; 2+ radial pulses b/l Extremities: no peripheral edema, no muscle wasting Neuro: strength and sensation intact in upper and lower extremities b/l     Labs on Admission: I have personally reviewed following labs and imaging studies  CBC: Recent Labs  Lab 01/02/21 0849 01/03/21 1930  WBC 7.2 12.1*  NEUTROABS 5.2 10.1*  HGB 14.1 16.0  HCT 41.1 46.0  MCV 84.0 83.5  PLT 289 [id number]   Basic Metabolic Panel: Recent Labs  Lab 01/02/21 0849 01/03/21 1930  NA 140 138  K 4.4 4.7  CL 103 102  CO2 25 19*  GLUCOSE 97 112*  BUN 13 19  CREATININE 0.98 1.34*  CALCIUM 9.4 9.6   GFR: Estimated Creatinine Clearance: 76.8 mL/min (A) (by C-G formula based on SCr of 1.34 mg/dL (H)). Liver Function Tests: Recent Labs  Lab 01/02/21 0849 01/03/21 1930  AST 23 51*  ALT 36 51*  ALKPHOS 101 99  BILITOT 0.4 1.7*  PROT 7.6 8.6*  ALBUMIN 3.9 4.5   Recent Labs  Lab 01/03/21 1930  LIPASE 37   No results for input(s): AMMONIA in the last 168 hours. Coagulation Profile: No results for input(s): INR, PROTIME in the last 168 hours. Cardiac Enzymes: No results for input(s): CKTOTAL, CKMB, CKMBINDEX, TROPONINI in the last 168 hours. BNP (last 3 results) No results for input(s): PROBNP in the last 8760 hours. HbA1C: No results for input(s): HGBA1C in the last 72 hours. CBG: No results for input(s): GLUCAP in the last 168 hours. Lipid  Profile: No results for input(s): CHOL, HDL, LDLCALC, TRIG, CHOLHDL, LDLDIRECT in the last 72 hours. Thyroid Function Tests: No results for input(s): TSH, T4TOTAL, FREET4, T3FREE, THYROIDAB in the last 72 hours. Anemia Panel: No results for input(s): VITAMINB12, FOLATE, FERRITIN, TIBC, IRON, RETICCTPCT in the last 72 hours. Urine analysis:    Component Value Date/Time   COLORURINE YELLOW 11/27/2020 0612   APPEARANCEUR CLEAR 11/27/2020 0612   LABSPEC >1.046 (H) 11/27/2020 0612   PHURINE 5.0 11/27/2020  [place] 11/27/2020 0612   HGBUR NEGATIVE 11/27/2020 0612   BILIRUBINUR NEGATIVE 11/27/2020 0612   KETONESUR 5 (A) 11/27/2020 0612   PROTEINUR NEGATIVE 01/02/2021 0930   NITRITE NEGATIVE 11/27/2020 0612   LEUKOCYTESUR NEGATIVE 11/27/2020 0612    Radiological Exams on Admission: CT ABDOMEN PELVIS W CONTRAST  Result Date: 01/03/2021 CLINICAL DATA:  Acute abdominal pain. Active chemotherapy for metastatic colon cancer. EXAM: CT ABDOMEN AND PELVIS WITH CONTRAST TECHNIQUE: Multidetector CT imaging of the abdomen and pelvis was performed using the standard protocol following bolus administration of intravenous contrast. CONTRAST:  149mL OMNIPAQUE IOHEXOL 300 MG/ML  SOLN COMPARISON:  Most recent CT 11/28/2020 FINDINGS: Lower chest: Linear atelectasis in the lingula and both lower lobes. No pleural fluid. Normal heart size. Hepatobiliary: Focal liver lesion. Gallbladder physiologically distended, no calcified stone. No biliary dilatation. Pancreas: No ductal dilatation or inflammation. Spleen: Normal in size without focal abnormality. Adrenals/Urinary Tract: No adrenal nodule. No hydronephrosis or perinephric edema. Homogeneous renal enhancement with symmetric excretion on delayed phase imaging. Urinary bladder is physiologically distended without wall thickening. Stomach/Bowel: Gastrostomy tube in place, balloon normally positioned in the stomach. Moderate intraluminal fluid in the  stomach. No gastric thickening. Fluid-filled loops of small bowel in the lower abdomen and pelvis which are prominence but no evidence of obstruction. There is wall thickening of the distal ileum. Fluid/liquid stool throughout the colon, primarily in the right and transverse colon. Sigmoid colonic diverticulosis. Mild sigmoid wall thickening in the region of diverticula but no focally inflamed diverticulitis. No evidence of colonic obstruction. No bowel pneumatosis. Appendix surgically absent. Vascular/Lymphatic: Aortic atherosclerosis. Right lower quadrant mesenteric adenopathy similar or mildly improved, short axis lymph node of 11 mm series 2, image 49. Patent portal and mesenteric veins. Reproductive: Prostate is unremarkable. Other: Small volume perihepatic and perisplenic ascites, slightly improved from prior. Omental and peritoneal nodularity involving the anterior abdomen, with slight improvement. Recent midline laparotomy changes. Ventral abdominal wall hernia, minimal fat in the inguinal canals. No free air or abscess. Musculoskeletal: Chronic bilateral L5 pars interarticularis defects with trace anterolisthesis of L5 on S1. 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 IMPRESSION: 1. Wall thickening of the terminal ileum again seen. Fluid-filled distal small bowel, ascending, and transverse colon without obstruction. 2. Equivocal improvement in omental and peritoneal metastatic disease with slightly improved small volume perihepatic and perisplenic ascites. Right lower quadrant mesenteric adenopathy is similar or mildly improved. 3. Sigmoid colonic diverticulosis. Mild mural wall thickening in the sigmoid, no acute diverticulitis. 4. Gastrostomy tube in place, balloon normally positioned in the stomach. 5. Chronic bilateral L5 pars interarticularis defects with trace anterolisthesis of L5 on S1. Chronic avascular necrosis of the left  femoral head. Aortic Atherosclerosis (ICD10-I70.0). Electronically Signed   By: Keith Rake M.D.   On: 01/03/2021 22:22     Assessment/Plan   Ricardo [name] is a 55 y.o. male with medical history significant for metastatic colon cancer status post partial bowel resection in March 2022 and subsequently on palliative chemotherapy, who is admitted to [facility] on 01/03/2021 with acute kidney injury after presenting from home to [facility] ED complaining of abdominal pain.    Principal Problem:   AKI (acute kidney injury) ([place]) Active Problems:   Dehydration   Abdominal pain   Nausea & vomiting   High anion gap metabolic acidosis   Leukocytosis     #) Acute Kidney Injury: Presenting creatinine 1.34 relative  to most recent prior value 0.98 on 01/02/2021.  Suspect that this is prerenal in nature in the setting of intravascular depletion due to dehydration from recent increase in GI losses in the form of several episodes of nausea/vomiting over the last 2 days as well as concomitant decline in oral intake over that timeframe.  Urinalysis has been ordered, with result currently pending.   Plan: monitor strict I's & O's and daily weights. Attempt to avoid nephrotoxic agents. Refrain from NSAIDs. Repeat CMP in the morning. Check serum magnesium level, serum Phos, and ionized calcium.  Follow-up result urinalysis with attention for the presence of urinary gas.  Check random urine sodium and random urine creatinine.  Lactated Ringer's at 100 cc/h.  If renal function does not improve with the above measures, can consider obtaining a renal ultrasound to evaluate for parenchymal abnormality as well as assess for evidence of postrenal obstructive process.  Evaluation and management of presenting nausea/vomiting, as further detailed below.      #) Generalized abdominal discomfort: 2 days of constant, sharp, generalized abdominal discomfort in the absence of any overt peritoneal signs on my  physical exam.  This pain has been poorly controlled at home over the last 2 days, in part due to the patient's inability to tolerate p.o. over that timeframe due to concomitant recurrent nausea/vomiting.  Consequently, he has been unable to establish reasonable pain control at home on his as needed oxycodone over that timeframe.  Suspect contribution from from known colon cancer with metastasis to the peritoneal cavity.  Today's CT abdomen/pelvis showed stable appearing wall thickening at the terminal ileum as well as fluid-filled distal small bowel, ascending, and transverse colon without evidence of obstruction, abscess, or perforation, and otherwise showed no evidence of acute intra-abdominal process.  Of note, liver enzymes show very mild interval transaminitis and a noncholestatic pattern.  Continue to trend his liver enzymes and correlate clinically with the patient's hospital course.  Urinalysis has been ordered, with result currently pending.  Lipase not elevated.   Plan: As needed IV Dilaudid.  Follow for result of urinalysis.  Repeat CMP in the morning as well as direct bilirubin at that time.  Further evaluation and management of presenting nausea/vomiting, as further described below, with goal to improve patient's ability to tolerate p.o. such that he is able to tolerate and gain sufficient pain control with oral analgesic medications for the purpose of ensuing discharged home.      #) Nausea/vomiting: Intermittent nausea resulting in 5-6 episodes of nonbloody, nonbilious emesis over the last 2 days preventing any significant p.o. intake over that timeframe.  Suspect contribution from palliative chemotherapy.  His nausea has been poorly controlled with outpatient as needed Compazine and Zofran, and his inability to take anything by mouth over the last few days it significantly impacted his ability to strive for pain control as relates to his abdominal discomfort.   Plan: As needed IV Ativan.   Add on serum magnesium level.  Lactated Ringer's at 100 cc/h.  Follow-up result urinalysis.  Repeat CMP in the morning.      #) Anion gap metabolic acidosis: Suspect contributions from presenting acute kidney injury as well as underlying malignancy itself.  Additionally, will check INR to evaluate hepatic synthetic function given known history of metastatic colon cancer.  No evidence of DKA.  No evidence of underlying infectious process at this time, and the patient does not meet SIRS criteria to be considered septic at this juncture..  Plan: Work-up and  management of presenting acute kidney injury, including IV fluids following for result urinalysis.  Check INR, as above.  Repeat CMP in the morning.     #) Leukocytosis: Mildly elevated white blood cell count at time of this evening's presentation.  Suspect an element of inflammatory leukocytosis in the setting of underlying metastatic colon cancer, as well as a contribution from hemoconcentration due to presenting dehydration.  No overt evidence of underlying infectious process at this time, although several components of infectious work-up remain pending at this time, as further detailed below.   Plan:-Follow-up result urinalysis.  Follow-up result of nasopharyngeal COVID-19 PCR, which was performed in the ED today.  Check chest x-ray.  Repeat CBC with differential in the morning.     DVT prophylaxis: Lovenox 40 mg subcu daily Code Status: Full code Family Communication: The patient's case was discussed with his wife, who was present at bedside Disposition Plan: Per Rounding Team Consults called: none  Admission status: Observation; med surge     Of note, this patient was added by me to the following Admit List/Treatment Team: [email or web address].      PLEASE NOTE THAT DRAGON DICTATION SOFTWARE WAS USED IN THE CONSTRUCTION OF THIS NOTE.   Rhetta Mura DO [facility] Pager [phone] From [place]   01/03/2021, 10:52 PM

## 2021-01-03 NOTE — ED Notes (Signed)
Patient transported to CT 

## 2021-01-03 NOTE — ED Triage Notes (Signed)
Patient reports abdominal pain with vomiting today. Chemo yesterday. Metastatic colon cancer.

## 2021-01-04 ENCOUNTER — Other Ambulatory Visit: Payer: Self-pay

## 2021-01-04 ENCOUNTER — Observation Stay (HOSPITAL_COMMUNITY): Payer: BC Managed Care – PPO

## 2021-01-04 ENCOUNTER — Inpatient Hospital Stay: Payer: BC Managed Care – PPO

## 2021-01-04 ENCOUNTER — Encounter (HOSPITAL_COMMUNITY): Payer: Self-pay | Admitting: Internal Medicine

## 2021-01-04 VITALS — BP 125/84 | HR 77 | Temp 97.8°F | Resp 18

## 2021-01-04 DIAGNOSIS — R112 Nausea with vomiting, unspecified: Secondary | ICD-10-CM | POA: Diagnosis not present

## 2021-01-04 DIAGNOSIS — E860 Dehydration: Secondary | ICD-10-CM | POA: Diagnosis not present

## 2021-01-04 DIAGNOSIS — E872 Acidosis: Secondary | ICD-10-CM | POA: Diagnosis present

## 2021-01-04 DIAGNOSIS — N179 Acute kidney failure, unspecified: Secondary | ICD-10-CM | POA: Diagnosis not present

## 2021-01-04 DIAGNOSIS — Z5112 Encounter for antineoplastic immunotherapy: Secondary | ICD-10-CM | POA: Diagnosis not present

## 2021-01-04 DIAGNOSIS — R109 Unspecified abdominal pain: Secondary | ICD-10-CM | POA: Diagnosis present

## 2021-01-04 DIAGNOSIS — E8729 Other acidosis: Secondary | ICD-10-CM | POA: Diagnosis present

## 2021-01-04 DIAGNOSIS — D72829 Elevated white blood cell count, unspecified: Secondary | ICD-10-CM | POA: Diagnosis present

## 2021-01-04 DIAGNOSIS — R1084 Generalized abdominal pain: Secondary | ICD-10-CM

## 2021-01-04 LAB — CBC WITH DIFFERENTIAL/PLATELET
Abs Immature Granulocytes: 0.03 10*3/uL (ref 0.00–0.07)
Basophils Absolute: 0.1 10*3/uL (ref 0.0–0.1)
Basophils Relative: 1 %
Eosinophils Absolute: 0.3 10*3/uL (ref 0.0–0.5)
Eosinophils Relative: 5 %
HCT: 39.3 % (ref 39.0–52.0)
Hemoglobin: 13 g/dL (ref 13.0–17.0)
Immature Granulocytes: 0 %
Lymphocytes Relative: 13 %
Lymphs Abs: 0.9 10*3/uL (ref 0.7–4.0)
MCH: 28.8 pg (ref 26.0–34.0)
MCHC: 33.1 g/dL (ref 30.0–36.0)
MCV: 87.1 fL (ref 80.0–100.0)
Monocytes Absolute: 0.8 10*3/uL (ref 0.1–1.0)
Monocytes Relative: 12 %
Neutro Abs: 4.8 10*3/uL (ref 1.7–7.7)
Neutrophils Relative %: 69 %
Platelets: 278 10*3/uL (ref 150–400)
RBC: 4.51 MIL/uL (ref 4.22–5.81)
RDW: 13.2 % (ref 11.5–15.5)
WBC: 6.9 10*3/uL (ref 4.0–10.5)
nRBC: 0 % (ref 0.0–0.2)

## 2021-01-04 LAB — BILIRUBIN, DIRECT: Bilirubin, Direct: 0.1 mg/dL (ref 0.0–0.2)

## 2021-01-04 LAB — URINALYSIS, ROUTINE W REFLEX MICROSCOPIC
Bilirubin Urine: NEGATIVE
Glucose, UA: NEGATIVE mg/dL
Hgb urine dipstick: NEGATIVE
Ketones, ur: NEGATIVE mg/dL
Leukocytes,Ua: NEGATIVE
Nitrite: NEGATIVE
Protein, ur: NEGATIVE mg/dL
Specific Gravity, Urine: 1.015 (ref 1.005–1.030)
pH: 5.5 (ref 5.0–8.0)

## 2021-01-04 LAB — COMPREHENSIVE METABOLIC PANEL
ALT: 39 U/L (ref 0–44)
AST: 24 U/L (ref 15–41)
Albumin: 3.3 g/dL — ABNORMAL LOW (ref 3.5–5.0)
Alkaline Phosphatase: 78 U/L (ref 38–126)
Anion gap: 10 (ref 5–15)
BUN: 19 mg/dL (ref 6–20)
CO2: 26 mmol/L (ref 22–32)
Calcium: 8.7 mg/dL — ABNORMAL LOW (ref 8.9–10.3)
Chloride: 104 mmol/L (ref 98–111)
Creatinine, Ser: 1.02 mg/dL (ref 0.61–1.24)
GFR, Estimated: >60 mL/min (ref >60)
Glucose, Bld: 101 mg/dL — ABNORMAL HIGH (ref 70–99)
Potassium: 4.1 mmol/L (ref 3.5–5.1)
Sodium: 140 mmol/L (ref 135–145)
Total Bilirubin: 0.2 mg/dL — ABNORMAL LOW (ref 0.3–1.2)
Total Protein: 6.4 g/dL — ABNORMAL LOW (ref 6.5–8.1)

## 2021-01-04 LAB — MAGNESIUM: Magnesium: 2 mg/dL (ref 1.7–2.4)

## 2021-01-04 LAB — SODIUM, URINE, RANDOM: Sodium, Ur: 72 mmol/L

## 2021-01-04 LAB — PHOSPHORUS: Phosphorus: 4.7 mg/dL — ABNORMAL HIGH (ref 2.5–4.6)

## 2021-01-04 LAB — SARS CORONAVIRUS 2 (TAT 6-24 HRS): SARS Coronavirus 2: NEGATIVE

## 2021-01-04 LAB — PROTIME-INR
INR: 1.2 (ref 0.8–1.2)
Prothrombin Time: 14.7 seconds (ref 11.4–15.2)

## 2021-01-04 LAB — CREATININE, URINE, RANDOM: Creatinine, Urine: 149.92 mg/dL

## 2021-01-04 MED ORDER — CHLORHEXIDINE GLUCONATE CLOTH 2 % EX PADS
6.0000 | MEDICATED_PAD | Freq: Every day | CUTANEOUS | Status: DC
  Administered 2021-01-04: 6 via TOPICAL

## 2021-01-04 MED ORDER — SODIUM CHLORIDE 0.9% FLUSH
10.0000 mL | INTRAVENOUS | Status: DC | PRN
  Administered 2021-01-04: 10 mL
  Filled 2021-01-04: qty 10

## 2021-01-04 MED ORDER — HEPARIN SOD (PORK) LOCK FLUSH 100 UNIT/ML IV SOLN
500.0000 [IU] | Freq: Once | INTRAVENOUS | Status: AC | PRN
Start: 2021-01-04 — End: 2021-01-04
  Administered 2021-01-04: 500 [IU]
  Filled 2021-01-04: qty 5

## 2021-01-04 NOTE — Discharge Summary (Signed)
Physician Discharge Summary  THORN PHOUTHAVONG [id number] DOB: May 11, 1966 DOA: 01/03/2021  PCP: [name], MD  Admit date: 01/03/2021 Discharge date: 01/04/2021  Admitted From: Home Disposition: Home  Recommendations for Outpatient Follow-up:  1. Follow up with PCP in 1-2 weeks 2. Follow up with Oncology Dr. Burr Medico within 1-2 weeks 3. Please obtain CMP/CBC, Mag, Phos in one week 4. Please follow up on the following pending results:  Home Health: No Equipment/Devices: None    Discharge Condition: Stable CODE STATUS: FULL CODE Diet recommendation: Regular Diet   Brief/Interim Summary: HPI per Dr. Babs Bertin on 01/03/21 [name] is a 55 y.o. male with medical history significant for metastatic colon cancer status post partial bowel resection in March 2022 and subsequently on palliative chemotherapy, who is admitted to [facility] on 01/03/2021 with acute kidney injury after presenting from home to [facility] ED complaining of abdominal pain.   The patient reports 2 days of progressive abdominal discomfort, which has been poorly controlled with his home as needed oxycodone.  He conveys that he abdominal discomfort has been generalized in distribution and nearly constant over the last 2 days, with worsening of sharp discomfort with palpation over the abdomen, with vomiting, and with attempts to move.  No preceding trauma.  He notes associated intermittent nausea resulting in at least 5-6 episodes of nonbloody, nonbilious emesis over the last 2 days.  Denies any associated diarrhea, melena, or hematochezia.  Continues to produce flatus.  Denies any associated subjective fever, chills, rigors, or generalized myalgias.  Not associate with any acute rash.  No recent traveling or known COVID-19 exposures.  Denies any overt chest pain, shortness of breath, palpitations, diaphoresis, presyncope, or syncope.  Also denies any acute dysuria, gross hematuria, or change in urinary  urgency/frequency.  Overall, over the last 2 days, the setting of his abdominal pain as well as intermittent nausea/vomiting, the patient reports significant decline in his oral intake over that time including limited ability to tolerate his as needed oxycodone to manage his underlying abdominal discomfort, noting that he is vomited on both occasions been trying to take this medication over that timeframe.  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  He underwent partial small bowel resection on 11/29/2020 as well as gastrostomy tube placement at that time.  Subsequently, he has been initiated on palliative chemotherapy.  ED Course:  Vital signs in the ED were notable for the following:  -Temperature max 98.3, heart rate 88-1 03; blood pressure 116/91 129/93; respiratory rate 17-22, oxygen saturation 94 to 100% on room air.  Labs were notable for the following: CMP was notable for the following: Sodium 138, potassium 4.7, bicarbonate 19, anion gap 17, BUN 19, creatinine 1.34 relative to most recent prior value of 0.98 on 01/02/2021, glucose 112, alkaline phosphatase 99, AST 51, ALT 51, total bilirubin 1.7.  These liver enzymes are relative to liver enzymes performed 2 days prior, which were notable for the following: Alkaline phosphatase 101, AST 23, ALT 36, total bilirubin 0.4.  Lipase checked today was found to be 37.  Urinalysis has been ordered, with result currently pending.  CBC was notable for white blood cell count of 12,000 with 83% neutrophils.  Nasopharyngeal COVID-19 PCR was performed in the ED today, with result currently pending.  CT abdomen/pelvis with contrast, as compared to most recent prior CT abdomen/pelvis performed on 11/28/2020, showed stable appearing wall  thickening of the terminal ileum; and showed fluid-filled distal small bowel, ascending and transverse colon without evidence of  obstruction, abscess, or perforation.  Additionally, today CT abdomen/pelvis showed equivocal improvement in the omental and peritoneal metastatic disease in the interval, with slightly improved to small volume perihepatic and perisplenic ascites.  Sigmoid colonic diverticulosis without acute diverticulitis.  Additionally, today CT abdomen/pelvis showed gastrostomy tube in place.   While in the ED, the following were administered: Fentanyl 50 mcg IV x1, Dilaudid 1 mg IV x1, Zofran 4 mg IV x1, and lactated Ringer's x1 L bolus.  In the setting of the patient's ongoing nausea and abdominal discomfort and spite of these measures provided in the ED, he was subsequently admitted for overnight observation to the [facility] floor for further evaluation and management of presenting acute kidney injury as well as improved symptomatic management in the setting of presenting abdominal pain as well as nausea/vomiting.  **Interim History Symptoms improved significantly and renal function resolved and went to baseline.  He is feeling much better and denies any chest pain shortness of breath or lightheadedness dizziness.  He is stable for discharge however we were not able to be accessed as Port-A-Cath is a completion of his chemotherapy so patient was discharged and he walked over to the cancer center for de-access.   Discharge Diagnoses:  Principal Problem:   AKI (acute kidney injury) ([place]) Active Problems:   Dehydration   Abdominal pain   Nausea & vomiting   High anion gap metabolic acidosis   Leukocytosis  AKI -Presenting creatinine 1.34 relative to most recent prior value 0.98 on 01/02/2021.   -Suspect that this is prerenal in nature in the setting of intravascular depletion due to dehydration from recent increase in GI losses in the form of several episodes of nausea/vomiting over the last 2 days as well as concomitant decline in oral intake over that timeframe.   -Urinalysis has been ordered and  unremarkable. -Monitor strict I's & O's and daily weights.  -Avoid nephrotoxic medications, contrast dyes, hypotension and renally dose medications -Checked random urine sodium (72) and random urine creatinine (149.92).   -Lactated Ringer's at 100 cc/h.   -If renal function does not improve with the above measures, can consider obtaining a renal ultrasound to evaluate for parenchymal abnormality as well as assess for evidence of postrenal obstructive process. -Patient's BUNs/creatinine improved significantly and went from 19/1.34 is now 19/1.02 -Patient is taking oral hydration well now and is no longer nauseous or vomiting -Follow-up with PCP and repeat CMP within 1 week  Generalized abdominal discomfort -He had 2 days of constant, sharp, generalized abdominal discomfort in the absence of any overt peritoneal signs on my physical exam.   -This pain has been poorly controlled at home over the last 2 days, in part due to the patient's inability to tolerate p.o. over that timeframe due to concomitant recurrent nausea/vomiting.   -Consequently, he has been unable to establish reasonable pain control at home on his as needed oxycodone over that timeframe.   -Suspect contribution from from known colon cancer with metastasis to the peritoneal cavity.   -CT abdomen/pelvis showed stable appearing wall thickening at the terminal ileum as well as fluid-filled distal small bowel, ascending, and transverse colon without evidence of obstruction, abscess, or perforation, and otherwise showed no evidence of acute intra-abdominal process.  Of note, liver enzymes show very mild interval transaminitis and a noncholestatic pattern.   -Given As needed IV Dilaudid but taken po and no Pain  at D/C -Follow up as an outpatient   Nausea/vomiting -Intermittent nausea resulting in 5-6 episodes of nonbloody, nonbilious emesis over the last 2 days preventing any significant p.o. intake over that timeframe.   -Suspect  contribution from palliative chemotherapy.  - His nausea had been poorly controlled with outpatient as needed Compazine and Zofran, and his inability to take anything by mouth over the last few days it significantly impacted his ability to strive for pain control as relates to his abdominal discomfort.  -Now his nausea vomiting is resolved with antiemetics and he is stable to be discharged  Anion gap metabolic acidosis -Suspect contributions from presenting acute kidney injury as well as underlying malignancy itself.   -Additionally, will check INR to evaluate hepatic synthetic function given known history of metastatic colon cancer.  No evidence of DKA.  No evidence of underlying infectious process at this time, and the patient does not meet SIRS criteria to be considered septic at this juncture.. -Acidosis has resolved and he has a CO2 of 26, chloride level of 104, anion gap of 10 -Follow-up in the outpatient setting  Leukocytosis -Likely reactive in the setting of nausea vomiting  -trended down and WBC went from 12.1 is now 6.9 -repeat CBC in outpatient setting  Obesity -Complicates overall prognosis and care -Estimated body mass index is 31.82 kg/m as calculated from the following:   Height as of this encounter: 5\' 11"  (1.803 m).   Weight as of this encounter: 103.5 kg. 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 Burr Medico -recently had FOLFOX and she added Bevacizumab to chemo and had cycle 2 of his chemotherapy 2 days ago and it is finished today -Follow-up with Dr. Burr Medico in outpatient setting  Discharge Instructions  Discharge Instructions    Call MD for:  difficulty breathing, headache or visual disturbances   Complete by: As directed    Call MD for:  extreme fatigue   Complete by: As directed    Call MD for:  hives   Complete by: As directed    Call MD  for:  persistant dizziness or light-headedness   Complete by: As directed    Call MD for:  persistant nausea and vomiting   Complete by: As directed    Call MD for:  redness, tenderness, or signs of infection (pain, swelling, redness, odor or green/yellow discharge around incision site)   Complete by: As directed    Call MD for:  severe uncontrolled pain   Complete by: As directed    Call MD for:  temperature >100.4   Complete by: As directed    Diet - low sodium heart healthy   Complete by: As directed    Discharge instructions   Complete by: As directed    You were cared for by a hospitalist during your hospital stay. If you have any questions about your discharge medications or the care you received while you were in the hospital after you are discharged, you can call the unit and ask to speak with the hospitalist on call if the hospitalist that took care of you is not available. Once you are discharged, your primary care physician will handle any further medical issues. Please note that NO REFILLS for any discharge medications will be authorized once you are discharged, as it is imperative that you return to your primary care physician (or establish a relationship with a primary  care physician if you do not have one) for your aftercare needs so that they can reassess your need for medications and monitor your lab values.  Follow up with PCP and Oncology Dr. Burr Medico. Take all medications as prescribed. If symptoms change or worsen please return to the ED for evaluation   Increase activity slowly   Complete by: As directed      Allergies as of 01/04/2021   No Known Allergies     Medication List    TAKE these medications   acetaminophen 500 MG tablet Commonly known as: TYLENOL Take 1,000 mg by mouth every 6 (six) hours as needed for mild pain.   dicyclomine 10 MG capsule Commonly known as: Bentyl Take 1 capsule (10 mg total) by mouth 4 (four) times daily -  before meals and at  bedtime.   lidocaine-prilocaine cream Commonly known as: EMLA Apply to affected area once What changed:   how much to take  how to take this  when to take this  reasons to take this   ondansetron 8 MG tablet Commonly known as: Zofran Take 1 tablet (8 mg total) by mouth 2 (two) times daily as needed for refractory nausea / vomiting. Start on day 3 after chemotherapy.   Oxycodone HCl 10 MG Tabs Take 1 tablet (10 mg total) by mouth every 6 (six) hours as needed (pain).   prochlorperazine 10 MG tablet Commonly known as: COMPAZINE Take 1 tablet (10 mg total) by mouth every 6 (six) hours as needed for nausea or vomiting.       No Known Allergies  Consultations:  None  Procedures/Studies: CT ABDOMEN PELVIS W CONTRAST  Result Date: 01/03/2021 CLINICAL DATA:  Acute abdominal pain. Active chemotherapy for metastatic colon cancer. EXAM: CT ABDOMEN AND PELVIS WITH CONTRAST TECHNIQUE: Multidetector CT imaging of the abdomen and pelvis was performed using the standard protocol following bolus administration of intravenous contrast. CONTRAST:  129mL OMNIPAQUE IOHEXOL 300 MG/ML  SOLN COMPARISON:  Most recent CT 11/28/2020 FINDINGS: Lower chest: Linear atelectasis in the lingula and both lower lobes. No pleural fluid. Normal heart size. Hepatobiliary: Focal liver lesion. Gallbladder physiologically distended, no calcified stone. No biliary dilatation. Pancreas: No ductal dilatation or inflammation. Spleen: Normal in size without focal abnormality. Adrenals/Urinary Tract: No adrenal nodule. No hydronephrosis or perinephric edema. Homogeneous renal enhancement with symmetric excretion on delayed phase imaging. Urinary bladder is physiologically distended without wall thickening. Stomach/Bowel: Gastrostomy tube in place, balloon normally positioned in the stomach. Moderate intraluminal fluid in the stomach. No gastric thickening. Fluid-filled loops of small bowel in the lower abdomen and pelvis  which are prominence but no evidence of obstruction. There is wall thickening of the distal ileum. Fluid/liquid stool throughout the colon, primarily in the right and transverse colon. Sigmoid colonic diverticulosis. Mild sigmoid wall thickening in the region of diverticula but no focally inflamed diverticulitis. No evidence of colonic obstruction. No bowel pneumatosis. Appendix surgically absent. Vascular/Lymphatic: Aortic atherosclerosis. Right lower quadrant mesenteric adenopathy similar or mildly improved, short axis lymph node of 11 mm series 2, image 49. Patent portal and mesenteric veins. Reproductive: Prostate is unremarkable. Other: Small volume perihepatic and perisplenic ascites, slightly improved from prior. Omental and peritoneal nodularity involving the anterior abdomen, with slight improvement. Recent midline laparotomy changes. Ventral abdominal wall hernia, minimal fat in the inguinal canals. No free air or abscess. Musculoskeletal: Chronic bilateral L5 pars interarticularis defects with trace anterolisthesis of L5 on S1. 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 IMPRESSION: 1. Wall thickening of the terminal ileum again seen. Fluid-filled distal small bowel, ascending, and transverse colon without obstruction. 2. Equivocal improvement in omental and peritoneal metastatic disease with slightly improved small volume perihepatic and perisplenic ascites. Right lower quadrant mesenteric adenopathy is similar or mildly improved. 3. Sigmoid colonic diverticulosis. Mild mural wall thickening in the sigmoid, no acute diverticulitis. 4. Gastrostomy tube in place, balloon normally positioned in the stomach. 5. Chronic bilateral L5 pars interarticularis defects with trace anterolisthesis of L5 on S1. Chronic avascular necrosis of the left femoral head. Aortic Atherosclerosis (ICD10-I70.0). Electronically Signed   By: Keith Rake  M.D.   On: 01/03/2021 22:22   DG Chest Port 1 View  Result Date: 01/04/2021 CLINICAL DATA:  Leukocytosis EXAM: PORTABLE CHEST 1 VIEW COMPARISON:  09/17/2006 FINDINGS: The heart size and mediastinal contours are within normal limits. Both lungs are clear. The visualized skeletal structures are unremarkable. Power port type central venous catheter with tip over the cavoatrial junction region. IMPRESSION: No active disease. Electronically Signed   By: Lucienne Capers M.D.   On: 01/04/2021 00:56   IR IMAGING GUIDED PORT INSERTION  Result Date: 12/12/2020 CLINICAL DATA:  Metastatic colon cancer EXAM: RIGHT INTERNAL JUGULAR SINGLE LUMEN POWER PORT CATHETER INSERTION Date:  12/12/2020 12/12/2020 9:45 am Radiologist:  M. Daryll Brod, MD Guidance:  Ultrasound and fluoroscopic MEDICATIONS: 1% lidocaine local ANESTHESIA/SEDATION: Versed 2.0 mg IV; Fentanyl 75 mcg IV; Moderate Sedation Time:  30 minutes The patient was continuously monitored during the procedure by the interventional radiology nurse under my direct supervision. FLUOROSCOPY TIME:  One minutes, 0 seconds (6 mGy) COMPLICATIONS: None immediate. CONTRAST:  None. PROCEDURE: Informed consent was obtained from the patient following explanation of the procedure, risks, benefits and alternatives. The patient understands, agrees and consents for the procedure. All questions were addressed. A time out was performed. Maximal barrier sterile technique utilized including caps, mask, sterile gowns, sterile gloves, large sterile drape, hand hygiene, and 2% chlorhexidine scrub. Under sterile conditions and local anesthesia, right internal jugular micropuncture venous access was performed. Access was performed with ultrasound. Images were obtained for documentation of the patent right internal jugular vein. A guide wire was inserted followed by a transitional dilator. This allowed insertion of a guide wire and catheter into the IVC. Measurements were obtained from the SVC  / RA junction back to the right IJ venotomy site. In the right infraclavicular chest, a subcutaneous pocket was created over the second anterior rib. This was done under sterile conditions and local anesthesia. 1% lidocaine with epinephrine was utilized for this. A 2.5 cm incision was made in the skin. Blunt dissection was performed to create a subcutaneous pocket over the right pectoralis major muscle. The pocket was flushed with saline vigorously. There was adequate hemostasis. The port catheter was assembled and checked for leakage. The port catheter was secured in the pocket with two retention sutures. The tubing was tunneled subcutaneously to the right venotomy site and inserted into the SVC/RA junction through a valved peel-away sheath. Position was confirmed with fluoroscopy. Images were obtained for documentation. The patient tolerated the procedure well. No immediate complications. Incisions were closed in a two layer fashion with 4 - 0 Vicryl suture. Dermabond was applied to the skin. The port catheter was accessed, blood was aspirated followed by saline and heparin flushes. Needle was removed. A dry sterile dressing was applied. IMPRESSION: Ultrasound and fluoroscopically guided right internal jugular single lumen power port catheter  insertion. Tip in the SVC/RA junction. Catheter ready for use. Electronically Signed   By: Jerilynn Mages.  Shick M.D.   On: 12/12/2020 10:06     Subjective: Seen and examined at bedside and he is doing much better today and his nausea vomiting and abdominal pain resolved.  Felt well and tolerated his meals without issues.  No other concerns or complaints at this time is stable to be discharged home.  Discharge Exam: Vitals:   01/04/21 0000 01/04/21 0123  BP: (!) 116/91 121/87  Pulse: 98 83  Resp: 17 18  Temp:  97.6 F (36.4 C)  SpO2: 94% 97%   Vitals:   01/03/21 2330 01/04/21 0000 01/04/21 0107 01/04/21 0123  BP: (!) 125/93 (!) 116/91  121/87  Pulse: (!) 101 98  83   Resp: 18 17  18   Temp:    97.6 F (36.4 C)  TempSrc:    Oral  SpO2: 96% 94%  97%  Weight:   103.5 kg   Height:   5\' 11"  (1.803 m)    General: Pt is alert, awake, not in acute distress Cardiovascular: RRR, S1/S2 +, no rubs, no gallops Respiratory: Mildly diminished bilaterally, no wheezing, no rhonchi; unlabored breathing Abdominal: Soft, NT, distended secondary body habitus, bowel sounds + Extremities: no edema, no cyanosis  The results of significant diagnostics from this hospitalization (including imaging, microbiology, ancillary and laboratory) are listed below for reference.    Microbiology: Recent Results (from the past 240 hour(s))  SARS CORONAVIRUS 2 (TAT 6-24 HRS) Nasopharyngeal Nasopharyngeal Swab     Status: None   Collection Time: 01/03/21 10:33 PM   Specimen: Nasopharyngeal Swab  Result Value Ref Range Status   SARS Coronavirus 2 NEGATIVE NEGATIVE Final    Comment: (NOTE) SARS-CoV-2 target nucleic acids are NOT DETECTED.  The SARS-CoV-2 RNA is generally detectable in upper and lower respiratory specimens during the acute phase of infection. Negative results do not preclude SARS-CoV-2 infection, do not rule out co-infections with other pathogens, and should not be used as the sole basis for treatment or other patient management decisions. Negative results must be combined with clinical observations, patient history, and epidemiological information. The expected result is Negative.  Fact Sheet for Patients: SugarRoll.be  Fact Sheet for Healthcare Providers: https://www.woods-mathews.com/  This test is not yet approved or cleared by the [place] FDA and  has been authorized for detection and/or diagnosis of SARS-CoV-2 by FDA under an Emergency Use Authorization (EUA). This EUA will remain  in effect (meaning this test can be used) for the duration of the COVID-19 declaration under Se ction 564(b)(1) of the Act, 21  U.S.C. section 360bbb-3(b)(1), unless the authorization is terminated or revoked sooner.  Performed at [facility], [place] [address], [place], [place] [address]    Labs: BNP (last 3 results) No results for input(s): BNP in the last 8760 hours. Basic Metabolic Panel: Recent Labs  Lab 01/02/21 0849 01/03/21 1930 01/03/21 2258 01/04/21 0357  NA 140 138  --  140  K 4.4 4.7  --  4.1  CL 103 102  --  104  CO2 25 19*  --  26  GLUCOSE 97 112*  --  101*  BUN 13 19  --  19  CREATININE 0.98 1.34*  --  1.02  CALCIUM 9.4 9.6  --  8.7*  MG  --   --  2.2 2.0  PHOS  --   --   --  4.7*   Liver Function Tests:  Recent Labs  Lab 01/02/21 0849 01/03/21 1930 01/04/21 0357  AST 23 51* 24  ALT 36 51* 39  ALKPHOS 101 99 78  BILITOT 0.4 1.7* 0.2*  PROT 7.6 8.6* 6.4*  ALBUMIN 3.9 4.5 3.3*   Recent Labs  Lab 01/03/21 1930  LIPASE 37   No results for input(s): AMMONIA in the last 168 hours. CBC: Recent Labs  Lab 01/02/21 0849 01/03/21 1930 01/04/21 0357  WBC 7.2 12.1* 6.9  NEUTROABS 5.2 10.1* 4.8  HGB 14.1 16.0 13.0  HCT 41.1 46.0 39.3  MCV 84.0 83.5 87.1  PLT 289 359 278   Cardiac Enzymes: No results for input(s): CKTOTAL, CKMB, CKMBINDEX, TROPONINI in the last 168 hours. BNP: Invalid input(s): POCBNP CBG: No results for input(s): GLUCAP in the last 168 hours. D-Dimer No results for input(s): DDIMER in the last 72 hours. Hgb A1c No results for input(s): HGBA1C in the last 72 hours. Lipid Profile No results for input(s): CHOL, HDL, LDLCALC, TRIG, CHOLHDL, LDLDIRECT in the last 72 hours. Thyroid function studies No results for input(s): TSH, T4TOTAL, T3FREE, THYROIDAB in the last 72 hours.  Invalid input(s): FREET3 Anemia work up No results for input(s): VITAMINB12, FOLATE, FERRITIN, TIBC, IRON, RETICCTPCT in the last 72 hours. Urinalysis    Component Value Date/Time   COLORURINE YELLOW 01/03/2021 1856   APPEARANCEUR CLEAR 01/03/2021 1856   LABSPEC 1.015  01/03/2021 1856   PHURINE 5.5 01/03/2021 1856   GLUCOSEU NEGATIVE 01/03/2021 1856   HGBUR NEGATIVE 01/03/2021 1856   BILIRUBINUR NEGATIVE 01/03/2021 1856   KETONESUR NEGATIVE 01/03/2021 1856   PROTEINUR NEGATIVE 01/03/2021 1856   NITRITE NEGATIVE 01/03/2021 1856   LEUKOCYTESUR NEGATIVE 01/03/2021 1856   Sepsis Labs Invalid input(s): PROCALCITONIN,  WBC,  LACTICIDVEN Microbiology Recent Results (from the past 240 hour(s))  SARS CORONAVIRUS 2 (TAT 6-24 HRS) Nasopharyngeal Nasopharyngeal Swab     Status: None   Collection Time: 01/03/21 10:33 PM   Specimen: Nasopharyngeal Swab  Result Value Ref Range Status   SARS Coronavirus 2 NEGATIVE NEGATIVE Final    Comment: (NOTE) SARS-CoV-2 target nucleic acids are NOT DETECTED.  The SARS-CoV-2 RNA is generally detectable in upper and lower respiratory specimens during the acute phase of infection. Negative results do not preclude SARS-CoV-2 infection, do not rule out co-infections with other pathogens, and should not be used as the sole basis for treatment or other patient management decisions. Negative results must be combined with clinical observations, patient history, and epidemiological information. The expected result is Negative.  Fact Sheet for Patients: SugarRoll.be  Fact Sheet for Healthcare Providers: https://www.woods-mathews.com/  This test is not yet approved or cleared by the [place] FDA and  has been authorized for detection and/or diagnosis of SARS-CoV-2 by FDA under an Emergency Use Authorization (EUA). This EUA will remain  in effect (meaning this test can be used) for the duration of the COVID-19 declaration under Se ction 564(b)(1) of the Act, 21 U.S.C. section 360bbb-3(b)(1), unless the authorization is terminated or revoked sooner.  Performed at [facility], [place] [address] [place], [place] [address]    Time coordinating discharge: 35  minutes  SIGNED:  Kerney Elbe, DO [facility] 01/04/2021, 6:13 PM Pager is on [place]  If 7PM-7AM, please contact night-coverage [email or web address]

## 2021-01-04 NOTE — Progress Notes (Signed)
Pt discharged from [facility] floor 3E and and came to [facility] to have chemo pump removed. Pt verbalizes that he is feeling "much better" denies s/s nausea, vomitting, cramping, pain at this time.

## 2021-01-04 NOTE — ED Notes (Signed)
ED TO INPATIENT HANDOFF REPORT  Name/Age/Gender Ricardo [name] 55 y.o. male  Code Status    Code Status Orders  (From admission, onward)         Start     Ordered   01/03/21 2257  Full code  Continuous        01/03/21 2256        Code Status History    Date Active Date Inactive Code Status Order ID Comments User Context   11/27/2020 0557 12/03/2020 1643 Full Code [id number]  Chotiner, Yevonne Aline, MD ED   01/17/2019 2312 01/18/2019 1719 Full Code [id number]  Ralene Ok, MD Inpatient   Advance Care Planning Activity      Home/SNF/Other Home  Chief Complaint AKI (acute kidney injury) ([place]) [N17.9]  Level of Care/Admitting Diagnosis ED Disposition    ED Disposition Condition [place]: [facility] [[id number]]  Level of Care: Med-Surg [16]  Covid Evaluation: Asymptomatic Screening Protocol (No Symptoms)  Diagnosis: AKI (acute kidney injury) [facility]) [[id number]]  Admitting Physician: Rhetta Mura [[id number]]  Attending Physician: Rhetta Mura [[id number]]       Medical History Past Medical History:  Diagnosis Date  . Arthritis   . Colon cancer ([place])    with metastasis  . Family history of adverse reaction to anesthesia    mother had problem with it due to her asthma  . Family history of breast cancer   . Family history of prostate cancer     Allergies No Known Allergies  IV Location/Drains/Wounds Patient Lines/Drains/Airways Status    Active Line/Drains/Airways    Name Placement date Placement time Site Days   Implanted Port 12/12/20 Right Chest 12/12/20  0939  Chest  23   Peripheral IV 01/03/21 Left Antecubital 01/03/21  1917  Antecubital  1   Gastrostomy/Enterostomy Gastrostomy 20 Fr. LUQ 11/29/20  1055  LUQ  36   Incision (Closed) 01/17/19 Abdomen 01/17/19  2135  -- 718   Incision (Closed) 11/29/20 Abdomen 11/29/20  1121  -- 36   Incision - 3 Ports Abdomen 1: Umbilicus 2: Mid;Lower 3: Left;Lateral 01/17/19  2038   -- 718          Labs/Imaging Results for orders placed or performed during the hospital encounter of 01/03/21 (from the past 48 hour(s))  CBC with Differential     Status: Abnormal   Collection Time: 01/03/21  7:30 PM  Result Value Ref Range   WBC 12.1 (H) 4.0 - 10.5 K/uL   RBC 5.51 4.22 - 5.81 MIL/uL   Hemoglobin 16.0 13.0 - 17.0 g/dL   HCT 46.0 39.0 - 52.0 %   MCV 83.5 80.0 - 100.0 fL   MCH 29.0 26.0 - 34.0 pg   MCHC 34.8 30.0 - 36.0 g/dL   RDW 12.8 11.5 - 15.5 %   Platelets 359 150 - 400 K/uL   nRBC 0.0 0.0 - 0.2 %   Neutrophils Relative % 83 %   Neutro Abs 10.1 (H) 1.7 - 7.7 K/uL   Lymphocytes Relative 5 %   Lymphs Abs 0.6 (L) 0.7 - 4.0 K/uL   Monocytes Relative 9 %   Monocytes Absolute 1.1 (H) 0.1 - 1.0 K/uL   Eosinophils Relative 1 %   Eosinophils Absolute 0.2 0.0 - 0.5 K/uL   Basophils Relative 1 %   Basophils Absolute 0.1 0.0 - 0.1 K/uL   Immature Granulocytes 1 %   Abs Immature Granulocytes 0.06 0.00 - 0.07 K/uL  Comment: Performed at [facility], [place] [address], [place], [place] [address]  Comprehensive metabolic panel     Status: Abnormal   Collection Time: 01/03/21  7:30 PM  Result Value Ref Range   Sodium 138 135 - 145 mmol/L   Potassium 4.7 3.5 - 5.1 mmol/L   Chloride 102 98 - 111 mmol/L   CO2 19 (L) 22 - 32 mmol/L   Glucose, Bld 112 (H) 70 - 99 mg/dL    Comment: Glucose reference range applies only to samples taken after fasting for at least 8 hours.   BUN 19 6 - 20 mg/dL   Creatinine, Ser 1.34 (H) 0.61 - 1.24 mg/dL   Calcium 9.6 8.9 - 10.3 mg/dL   Total Protein 8.6 (H) 6.5 - 8.1 g/dL   Albumin 4.5 3.5 - 5.0 g/dL   AST 51 (H) 15 - 41 U/L   ALT 51 (H) 0 - 44 U/L   Alkaline Phosphatase 99 38 - 126 U/L   Total Bilirubin 1.7 (H) 0.3 - 1.2 mg/dL   GFR, Estimated >60 >60 mL/min    Comment: (NOTE) Calculated using the CKD-EPI Creatinine Equation (2021)    Anion gap 17 (H) 5 - 15    Comment: Performed at [facility], [place] [address] [place], [place] [address]  Lipase, blood     Status: None   Collection Time: 01/03/21  7:30 PM  Result Value Ref Range   Lipase 37 11 - 51 U/L    Comment: Performed at [facility], [place] [address], [place], [place] [address]  Magnesium     Status: None   Collection Time: 01/03/21 10:58 PM  Result Value Ref Range   Magnesium 2.2 1.7 - 2.4 mg/dL    Comment: Performed at [facility], [place] [address], [place], [place] [address]   CT ABDOMEN PELVIS W CONTRAST  Result Date: 01/03/2021 CLINICAL DATA:  Acute abdominal pain. Active chemotherapy for metastatic colon cancer. EXAM: CT ABDOMEN AND PELVIS WITH CONTRAST TECHNIQUE: Multidetector CT imaging of the abdomen and pelvis was performed using the standard protocol following bolus administration of intravenous contrast. CONTRAST:  144mL OMNIPAQUE IOHEXOL 300 MG/ML  SOLN COMPARISON:  Most recent CT 11/28/2020 FINDINGS: Lower chest: Linear atelectasis in the lingula and both lower lobes. No pleural fluid. Normal heart size. Hepatobiliary: Focal liver lesion. Gallbladder physiologically distended, no calcified stone. No biliary dilatation. Pancreas: No ductal dilatation or inflammation. Spleen: Normal in size without focal abnormality. Adrenals/Urinary Tract: No adrenal nodule. No hydronephrosis or perinephric edema. Homogeneous renal enhancement with symmetric excretion on delayed phase imaging. Urinary bladder is physiologically distended without wall thickening. Stomach/Bowel: Gastrostomy tube in place, balloon normally positioned in the stomach. Moderate intraluminal fluid in the stomach. No gastric thickening. Fluid-filled loops of small bowel in the lower abdomen and pelvis which are prominence but no evidence of obstruction. There is wall thickening of the distal ileum. Fluid/liquid stool throughout the colon, primarily in the right and transverse colon. Sigmoid colonic diverticulosis. Mild  sigmoid wall thickening in the region of diverticula but no focally inflamed diverticulitis. No evidence of colonic obstruction. No bowel pneumatosis. Appendix surgically absent. Vascular/Lymphatic: Aortic atherosclerosis. Right lower quadrant mesenteric adenopathy similar or mildly improved, short axis lymph node of 11 mm series 2, image 49. Patent portal and mesenteric veins. Reproductive: Prostate is unremarkable. Other: Small volume perihepatic and perisplenic ascites, slightly improved from prior. Omental and peritoneal nodularity involving the anterior abdomen, with slight improvement. Recent  midline laparotomy changes. Ventral abdominal wall hernia, minimal fat in the inguinal canals. No free air or abscess. Musculoskeletal: Chronic bilateral L5 pars interarticularis defects with trace anterolisthesis of L5 on S1. 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 IMPRESSION: 1. Wall thickening of the terminal ileum again seen. Fluid-filled distal small bowel, ascending, and transverse colon without obstruction. 2. Equivocal improvement in omental and peritoneal metastatic disease with slightly improved small volume perihepatic and perisplenic ascites. Right lower quadrant mesenteric adenopathy is similar or mildly improved. 3. Sigmoid colonic diverticulosis. Mild mural wall thickening in the sigmoid, no acute diverticulitis. 4. Gastrostomy tube in place, balloon normally positioned in the stomach. 5. Chronic bilateral L5 pars interarticularis defects with trace anterolisthesis of L5 on S1. Chronic avascular necrosis of the left femoral head. Aortic Atherosclerosis (ICD10-I70.0). Electronically Signed   By: Keith Rake M.D.   On: 01/03/2021 22:22    Pending Labs Unresulted Labs (From admission, onward)          Start     Ordered   01/04/21 0500  Magnesium  Tomorrow morning,   R       Question:  Specimen collection method  Answer:   Lab=Lab collect   01/03/21 2257   01/04/21 0500  Comprehensive metabolic panel  Tomorrow morning,   R       Question:  Specimen collection method  Answer:  Lab=Lab collect   01/03/21 2257   01/04/21 0500  CBC with Differential/Platelet  Tomorrow morning,   R       Question:  Specimen collection method  Answer:  Lab=Lab collect   01/03/21 2257   01/04/21 0500  Phosphorus  Tomorrow morning,   R       Question:  Specimen collection method  Answer:  Lab=Lab collect   01/04/21 0015   01/04/21 0500  Bilirubin, direct  Tomorrow morning,   R        01/04/21 0016   01/04/21 0500  Protime-INR  Tomorrow morning,   R        01/04/21 0016   01/04/21 0500  Calcium, ionized  Tomorrow morning,   R        01/04/21 0016   01/04/21 0017  Sodium, urine, random  Add-on,   AD        01/04/21 0017   01/04/21 0017  Creatinine, urine, random  Add-on,   AD        01/04/21 0017   01/03/21 2233  SARS CORONAVIRUS 2 (TAT 6-24 HRS) Nasopharyngeal Nasopharyngeal Swab  (Tier 3 - Symptomatic/asymptomatic)  Once,   STAT       Question Answer Comment  Is this test for diagnosis or screening Screening   Symptomatic for COVID-19 as defined by CDC No   Hospitalized for COVID-19 No   Admitted to ICU for COVID-19 No   Previously tested for COVID-19 Yes   Resident in a congregate (group) care setting Unknown   Employed in healthcare setting Unknown   Has patient completed COVID vaccination(s) (2 doses of Pfizer/Moderna 1 dose of [employer]) Unknown      01/03/21 2233   01/03/21 1856  Urinalysis, Routine w reflex microscopic  ONCE - STAT,   STAT        01/03/21 1857          Vitals/Pain Today's Vitals   01/03/21 2206 01/03/21 2230 01/03/21 2330 01/04/21 0000  BP: (!) 118/95  (!) 125/93 (!) 116/91  Pulse: 98  (!) 101 98  Resp: 18  18 17   Temp:      TempSrc:      SpO2: 95%  96% 94%  PainSc:  4       Isolation Precautions No active isolations  Medications Medications  lactated ringers infusion (has  no administration in time range)  acetaminophen (TYLENOL) tablet 650 mg (has no administration in time range)    Or  acetaminophen (TYLENOL) suppository 650 mg (has no administration in time range)  enoxaparin (LOVENOX) injection 40 mg (has no administration in time range)  naloxone (NARCAN) injection 0.4 mg (has no administration in time range)  HYDROmorphone (DILAUDID) injection 1 mg (has no administration in time range)  LORazepam (ATIVAN) injection 0.5 mg (0.5 mg Intravenous Given 01/03/21 2342)  lactated ringers bolus 1,000 mL (0 mLs Intravenous Stopped 01/03/21 2025)  fentaNYL (SUBLIMAZE) injection 50 mcg (50 mcg Intravenous Given 01/03/21 1929)  ondansetron (ZOFRAN) injection 4 mg (4 mg Intravenous Given 01/03/21 1928)  HYDROmorphone (DILAUDID) injection 1 mg (1 mg Intravenous Given 01/03/21 2025)  iohexol (OMNIPAQUE) 300 MG/ML solution 100 mL (100 mLs Intravenous Contrast Given 01/03/21 2151)  HYDROmorphone (DILAUDID) injection 1 mg (1 mg Intravenous Given 01/03/21 2207)    Mobility walks

## 2021-01-04 NOTE — Plan of Care (Signed)
  Problem: Nutrition: Goal: Adequate nutrition will be maintained Outcome: Progressing   Problem: Safety: Goal: Ability to remain free from injury will improve Outcome: Progressing   

## 2021-01-04 NOTE — Patient Instructions (Signed)

## 2021-01-05 LAB — CALCIUM, IONIZED: Calcium, Ionized, Serum: 4.9 mg/dL (ref 4.5–5.6)

## 2021-01-09 ENCOUNTER — Telehealth: Payer: Self-pay

## 2021-01-09 ENCOUNTER — Other Ambulatory Visit: Payer: Self-pay | Admitting: Hematology

## 2021-01-09 MED ORDER — OXYCODONE HCL 10 MG PO TABS: 10.0000 mg | ORAL_TABLET | Freq: Four times a day (QID) | ORAL | 0 refills | Status: DC | PRN

## 2021-01-09 MED ORDER — ZOLPIDEM TARTRATE 5 MG PO TABS: 5.0000 mg | ORAL_TABLET | Freq: Every evening | ORAL | 0 refills | Status: DC | PRN

## 2021-01-09 NOTE — Telephone Encounter (Signed)
Called patient back with Dr. Ernestina Penna recommendations.  I have advised him to take the Oxycodone one very 6 hours, she has sent in a refill for this.  Also for sleep she has sent in Ambien 5 mg to be taken at bedtime.  Also I have instructed him to go back to liquid/soft diet.  Dr. Burr Medico feels like he has advanced his diet too soon.  Dr. Burr Medico will plan to see him when he comes in as scheduled on 5/5.  The patient verbalized an understanding and appreciated the call. I have also encouraged the patient to call sooner if he develops adverse side effects.

## 2021-01-09 NOTE — Progress Notes (Signed)
Patient calls with concerns regarding last treatment and side effects.  He states he received treatment on 01/02/2021 (Thursday) landed in ED on 01/03/2021 with what he reports as vomiting for 8 hours straight when he got home and increased abdominal pain.  Received IVF, nausea meds and IV pain medications and felt much better upon discharge.  His has several concerns.   1) His pain is a constant 5/10 and feels like the Oxycodone is not as helpful as at first started, except he has not routinely been taking it every 6 hours.  I have encouraged him to take it.  He needs a refill.  2) He is not sleeping.  Reporting only about 4 hours sleep. He is requesting something to take for sleep.  3) He is concerned he is going to have the same reaction to chemotherapy as last time.  I have suggested that maybe we could give him IVF with his pump d/c.   I told him I would discuss all with Dr. Burr Medico and let him know the plan moving forward.

## 2021-01-15 NOTE — Progress Notes (Signed)
[place]   Telephone:([phone]) [phone] Fax:(336) [phone]   Clinic Follow up Note   Patient Care Team: [name], MD as PCP - General (Internal Medicine) [name], MD as Consulting Physician (Oncology) [name], RN as Oncology Nurse Navigator  Date of Service:  01/16/2021  CHIEF COMPLAINT: f/u of metastatic colon cancer  SUMMARY OF ONCOLOGIC HISTORY: Oncology History Overview Note  Cancer Staging No matching staging information was found for the patient.    metastatic cecal cancer  11/27/2020 Imaging   CT Angio CAP  IMPRESSION: 1. Thickening of the terminal ileum with associated proximal mid to distal small bowel obstruction. Findings could be due to an ileitis versus malignancy. Nonspecific mesenteric edema could be due to engorgement versus metastases. No associated bowel perforation. Recommend endoscopy for further evaluation. 2. Indeterminate right lower quadrant lymphadenopathy. 3. Scattered colonic diverticulosis with no acute diverticulitis. 4. Stable right hepatic lobe subcentimeter hyperdensity likely represents a hepatic hemangioma. 5. No acute vascular abnormality. Aortic Atherosclerosis (ICD10-I70.0) - mild. 6. No acute intrathoracic abnormality.   11/28/2020 Imaging   CT AP  IMPRESSION: 1. There is masslike, circumferential thickening of the terminal ileum and cecal base near the ileocecal valve and abnormally enlarged lymph nodes in the right lower quadrant mesentery adjacent to the terminal ileum measuring up to 2.3 x 1.4 cm. 2. There is extensive omental and peritoneal nodularity and caking throughout the abdomen. 3. Findings are highly concerning for primary colon malignancy with nodal and peritoneal metastatic disease. 4. Small volume perihepatic and perisplenic ascites. 5. The small bowel is generally decompressed, with full some fluid-filled, nondistended loops throughout. There is transit of oral enteric  contrast to the terminal ileum. No evidence of overt bowel obstruction at this time. Esophagogastric tube is position with tip and side port below the diaphragm. 6. Atelectasis or consolidation of the dependent bilateral lung bases, new compared to prior examination.   Aortic Atherosclerosis (ICD10-I70.0).     11/29/2020 Surgery   EXPLORATORY LAPAROTOMY, PARTIAL BOWEL RESECTION, POSSIBLE OSTOMY CREATION, PERITIONEAL BIOPSY, INSERTION OF GASTROSTOMY TUBE by Dr [name]   11/29/2020 Initial Biopsy   FINAL MICROSCOPIC DIAGNOSIS:   AB. 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    COMMENT:   Immunohistochemistry (IHC) for CK20 and CDX-2 is strong and diffusely  positive.  CK7, TTF-1, Synaptophysin, Chromogranin and CD56 are  negative.  The immunophenotype is compatible with origin from the lower  gastrointestinal tract.  IHC for MMR will be reported separately.  Case  preliminarily discussed with Dr. [name] on 12/02/2020.   At the request of Dr. [name], [phone] was reviewed in retrospect.  Review of the submitted sections confirms the presence of acute  appendicitis.  No malignancy is identified.    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 Wall thickening of the terminal ileum again seen. Fluid-filled distal small bowel, ascending, and transverse colon without obstruction. 2. Equivocal improvement in omental and peritoneal metastatic disease with slightly improved small volume perihepatic and perisplenic ascites. Right lower quadrant mesenteric adenopathy is similar or mildly improved. 3. Sigmoid colonic diverticulosis. Mild mural wall thickening in the sigmoid, no acute diverticulitis. 4. Gastrostomy tube in place, balloon normally positioned in the stomach. 5. Chronic bilateral L5 pars interarticularis defects with trace anterolisthesis of L5 on S1. Chronic avascular necrosis of the left femoral head.      CURRENT THERAPY:  First line FOLFOX q2 weeks starting 12/18/20 Bevacizumab added with cycle 2  INTERVAL HISTORY:  Ricardo [name] is here for a follow up of metastatic colon cancer. He was last seen by me on 01/02/21. He presents to the clinic alone. He reports he vomited for several hours and experienced abdominal cramps, prompting him to go to the ED. He was treated for dehydration and for the cramps. He reports he is on a soft diet now. He is trying to cook foods down so they are soft. He notes he feels great now. He has had 13 days of regular bowel movements. The Ambien and oxycodone have helped him a lot. 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 He notes his tube is bothersome, and he does not use it anymore. He notes the sutures have also popped, so the tube is loose.  All other systems were reviewed with the patient and are negative.  MEDICAL HISTORY:  Past Medical History:  Diagnosis Date  . Arthritis   . Colon cancer (Home)    with metastasis  . Family history of adverse reaction to anesthesia    mother had problem with it due to her asthma  . Family history of breast cancer   . Family history of prostate cancer      SURGICAL HISTORY: Past Surgical History:  Procedure Laterality Date  . BOWEL RESECTION N/A 11/29/2020   Procedure: PARTIAL BOWEL RESECTION;  Surgeon: Jovita Kussmaul, MD;  Location: [facility];  Service: General;  Laterality: N/A;  . GASTROSTOMY Left 11/29/2020   Procedure: INSERTION OF GASTROSTOMY TUBE;  Surgeon: Jovita Kussmaul, MD;  Location: [facility];  Service: General;  Laterality: Left;  . IR IMAGING GUIDED PORT INSERTION  12/12/2020  . LAPAROSCOPIC APPENDECTOMY N/A 01/17/2019   Procedure: APPENDECTOMY LAPAROSCOPIC;  Surgeon: Ralene Ok, MD;  Location: [facility];  Service: General;  Laterality: N/A;  . LAPAROTOMY N/A 11/29/2020   Procedure: EXPLORATORY LAPAROTOMY;  Surgeon: Jovita Kussmaul, MD;  Location: [place];  Service: General;  Laterality: N/A;  PUT CASE IN ROOM 1 STARTING AT 9:30AM FOR 120 MIN  . OSTOMY N/A 11/29/2020   Procedure: POSSIBLE OSTOMY CREATION;  Surgeon: Jovita Kussmaul, MD;  Location: [facility];  Service: General;  Laterality: N/A;  . RECTAL BIOPSY N/A 11/29/2020   Procedure: PERITIONEAL BIOPSY;  Surgeon: Jovita Kussmaul, MD;  Location: [facility];  Service: General;  Laterality: N/A;  . SHOULDER SURGERY Left 09/19/2015    I have reviewed the social history and family history with the patient and they are unchanged from previous note.  ALLERGIES:  has No Known Allergies.  MEDICATIONS:  Current Outpatient Medications  Medication Sig Dispense Refill  . acetaminophen (TYLENOL) 500 MG tablet Take 1,000 mg by mouth  every 6 (six) hours as needed for mild pain.    Marland Kitchen dicyclomine (BENTYL) 10 MG capsule Take 1 capsule (10 mg total) by mouth 4 (four) times daily -  before meals and at bedtime. 30 capsule 1  . lidocaine-prilocaine (EMLA) cream Apply to affected area once (Patient taking differently: Apply 1 application topically daily as needed. Apply to affected area once) 30 g 3  . ondansetron (ZOFRAN) 8 MG tablet Take 1 tablet (8 mg total) by mouth 2 (two) times daily as needed for refractory  nausea / vomiting. Start on day 3 after chemotherapy. 30 tablet 2  . Oxycodone HCl 10 MG TABS Take 1 tablet (10 mg total) by mouth every 6 (six) hours as needed (pain). 60 tablet 0  . prochlorperazine (COMPAZINE) 10 MG tablet Take 1 tablet (10 mg total) by mouth every 6 (six) hours as needed for nausea or vomiting. 30 tablet 2  . zolpidem (AMBIEN) 5 MG tablet Take 1 tablet (5 mg total) by mouth at bedtime as needed for sleep. 30 tablet 0   No current facility-administered medications for this visit.    PHYSICAL EXAMINATION: ECOG PERFORMANCE STATUS: 1 - Symptomatic but completely ambulatory  Vitals:   01/16/21 0925  BP: 130/86  Pulse: 66  Resp: 17  Temp: 97.9 F (36.6 C)  SpO2: 99%   Filed Weights   01/16/21 0925  Weight: 228 lb 6.4 oz (103.6 kg)    Due to COVID19 we will limit examination to appearance. Patient had no complaints.  GENERAL:alert, no distress and comfortable SKIN: skin color normal, no rashes or significant lesions EYES: normal, Conjunctiva are pink and non-injected, sclera clear  NEURO: alert & oriented x 3 with fluent speech  LABORATORY DATA:  I have reviewed the data as listed CBC Latest Ref Rng & Units 01/16/2021 01/04/2021 01/03/2021  WBC 4.0 - 10.5 K/uL 6.3 6.9 12.1(H)  Hemoglobin 13.0 - 17.0 g/dL 13.9 13.0 16.0  Hematocrit 39.0 - 52.0 % 39.9 39.3 46.0  Platelets 150 - 400 K/uL 215 278 359     CMP Latest Ref Rng & Units 01/16/2021 01/04/2021 01/03/2021  Glucose 70 - 99 mg/dL 76 101(H) 112(H)  BUN 6 - 20 mg/dL '14 19 19  ' Creatinine 0.61 - 1.24 mg/dL 0.99 1.02 1.34(H)  Sodium 135 - 145 mmol/L 137 140 138  Potassium 3.5 - 5.1 mmol/L 4.3 4.1 4.7  Chloride 98 - 111 mmol/L 105 104 102  CO2 22 - 32 mmol/L 24 26 19(L)  Calcium 8.9 - 10.3 mg/dL 9.2 8.7(L) 9.6  Total Protein 6.5 - 8.1 g/dL 7.3 6.4(L) 8.6(H)  Total Bilirubin 0.3 - 1.2 mg/dL 0.4 0.2(L) 1.7(H)  Alkaline Phos 38 - 126 U/L 97 78 99  AST 15 - 41 U/L 19 24 51(H)  ALT 0 - 44 U/L 23 39 51(H)       RADIOGRAPHIC STUDIES: I have personally reviewed the radiological images as listed and agreed with the findings in the report. No results found.   ASSESSMENT & PLAN:  NEVADA MULLETT is a 55 y.o. male with   1.Adenocarcinomaofterminal iliumand cecum withmetastatic to intra-abdominal lymph nodeand peritoneum, stage IV,MMR normal, KRS/NRS/BRAF wild type -After 8-9 weeks of intermittent but increasing lower abdominal pain, bloating and constipation he was admitted to hospital on 11/27/20. Work up showed mass in theterminal ileumwith extensive omental nodularity and LN involvement. His baseline tumor marker CA 19-9and CEAwerenormal.  -Exlapsurgery with G-tube placementwith Dr [name] on 3/18/22he was found to have diffuse peritoneal metastasis and  omental biopsy confirmed poorly differentiatedadenocarcinoma with focal signet ring cellfeaturesand IHC studies support low GI primary.This confirms stage IV metastatic cancer from cecum -He previously consented to first-line palliative chemo with FOLFOX. If he has excellent response to treatment we may refer him for HIPECdebulking surgery in the future. -Began cycle 1 FOLFOX on 12/18/2020, has tolerated well and feels better -FO shows KRAS/NRAS wildtype, but given his right side colon cancer, we do not use EGFR inhibitor in first line.  -Bevacizumab added with cycle 2. Experienced worsening abdominal pain, nausea, and vomiting the day after cycle 2, prompting an ED visit. -CT A/P on 01/03/21 in the ED showed fluid-filled distal small bowel ascending and transverse colon without evidence for obstruction. -His GI symptoms have much improved since he changed to low residual diet -Will proceed with cycle 3 FOLFOX with bevacizumab today. -Plan to restage with CT scan after cycle 5 -Patient is not using his JG tube and would like to remove it which is fine with me. He has appointment with a surgeon in a few weeks.  2. Symptom  Management: Constipation, lower abdominal pain, abdominal bloating secondary #1and partial bowl obstruction -For 8-9 weeks before hospitalization he was having increasing lower abdominal pain and constipation -He did haveG-tubeplaced with surgery on 11/29/20. He has not needed to use this since starting chemo. -since starting chemo 12/18/20 he started dicyclomine which helps tremendously.  -He experienced nausea, vomiting, and cramping day two of cycle 2, prompting ED visit. He has been back on a soft diet, which has helped. He is feeling well again. -I refilled his Zofran and Compazine. He will continue with oxycodone and ambien as needed.  3. Genetic testing  -He has brother with prostate cancer and sister with breast cancer in their 29s. He is eligible for genetic testing. He is interested. -Consult with [facility] genetics labs on4/13/2022  4. Social Support -He is an avid cyclist. Last long distant riding was 06/2020 across [place]. He has not biked in the 8-9 weeks before cancer diagnosis due to symptoms. His weight heavily fluctuates with changes in his activity level, diet and biking.  -He is married with 1 adult son. His brother Jenny Reichmann is in town. He works as a Designer, television/film set at [employer]. He may take time off work based on how to tolerate chemotherapy.  -He was previously offered [facility] resources including chaplin and SWif needed. He notes he has good family support   Plan: -Proceed with C3 FOLFOX and bevacizumab today -Labs, chemo, and f/u in 2 weeks   No problem-specific Assessment & Plan notes found for this encounter.   No orders of the defined types were placed in this encounter.  All questions were answered. The patient knows to call the clinic with any problems, questions or concerns. No barriers to learning was detected. The total time spent in the appointment was 30 minutes.     [name], MD 01/16/2021   I, Wilburn Mylar, am acting as scribe for  [name], MD.   I have reviewed the above documentation for accuracy and completeness, and I agree with the above.

## 2021-01-16 ENCOUNTER — Inpatient Hospital Stay: Payer: BC Managed Care – PPO

## 2021-01-16 ENCOUNTER — Inpatient Hospital Stay (HOSPITAL_BASED_OUTPATIENT_CLINIC_OR_DEPARTMENT_OTHER): Payer: BC Managed Care – PPO

## 2021-01-16 ENCOUNTER — Inpatient Hospital Stay: Payer: BC Managed Care – PPO | Attending: Nurse Practitioner | Admitting: Hematology

## 2021-01-16 ENCOUNTER — Inpatient Hospital Stay: Payer: BC Managed Care – PPO | Admitting: Nutrition

## 2021-01-16 ENCOUNTER — Other Ambulatory Visit: Payer: Self-pay

## 2021-01-16 ENCOUNTER — Encounter: Payer: Self-pay | Admitting: Nurse Practitioner

## 2021-01-16 ENCOUNTER — Encounter: Payer: Self-pay | Admitting: Hematology

## 2021-01-16 ENCOUNTER — Other Ambulatory Visit: Payer: BC Managed Care – PPO

## 2021-01-16 VITALS — BP 130/86 | HR 66 | Temp 97.9°F | Resp 17 | Ht 71.0 in | Wt 228.4 lb

## 2021-01-16 DIAGNOSIS — C182 Malignant neoplasm of ascending colon: Secondary | ICD-10-CM

## 2021-01-16 DIAGNOSIS — C772 Secondary and unspecified malignant neoplasm of intra-abdominal lymph nodes: Secondary | ICD-10-CM

## 2021-01-16 DIAGNOSIS — Z5111 Encounter for antineoplastic chemotherapy: Secondary | ICD-10-CM | POA: Diagnosis present

## 2021-01-16 DIAGNOSIS — Z931 Gastrostomy status: Secondary | ICD-10-CM | POA: Diagnosis not present

## 2021-01-16 DIAGNOSIS — C786 Secondary malignant neoplasm of retroperitoneum and peritoneum: Secondary | ICD-10-CM

## 2021-01-16 DIAGNOSIS — Z79899 Other long term (current) drug therapy: Secondary | ICD-10-CM | POA: Insufficient documentation

## 2021-01-16 DIAGNOSIS — Z95828 Presence of other vascular implants and grafts: Secondary | ICD-10-CM

## 2021-01-16 LAB — CBC WITH DIFFERENTIAL (CANCER CENTER ONLY)
Abs Immature Granulocytes: 0.02 10*3/uL (ref 0.00–0.07)
Basophils Absolute: 0 10*3/uL (ref 0.0–0.1)
Basophils Relative: 1 %
Eosinophils Absolute: 0.1 10*3/uL (ref 0.0–0.5)
Eosinophils Relative: 1 %
HCT: 39.9 % (ref 39.0–52.0)
Hemoglobin: 13.9 g/dL (ref 13.0–17.0)
Immature Granulocytes: 0 %
Lymphocytes Relative: 18 %
Lymphs Abs: 1.1 10*3/uL (ref 0.7–4.0)
MCH: 29 pg (ref 26.0–34.0)
MCHC: 34.8 g/dL (ref 30.0–36.0)
MCV: 83.3 fL (ref 80.0–100.0)
Monocytes Absolute: 0.9 10*3/uL (ref 0.1–1.0)
Monocytes Relative: 15 %
Neutro Abs: 4.2 10*3/uL (ref 1.7–7.7)
Neutrophils Relative %: 65 %
Platelet Count: 215 10*3/uL (ref 150–400)
RBC: 4.79 MIL/uL (ref 4.22–5.81)
RDW: 13.6 % (ref 11.5–15.5)
WBC Count: 6.3 10*3/uL (ref 4.0–10.5)
nRBC: 0 % (ref 0.0–0.2)

## 2021-01-16 LAB — CMP (CANCER CENTER ONLY)
ALT: 23 U/L (ref 0–44)
AST: 19 U/L (ref 15–41)
Albumin: 3.9 g/dL (ref 3.5–5.0)
Alkaline Phosphatase: 97 U/L (ref 38–126)
Anion gap: 8 (ref 5–15)
BUN: 14 mg/dL (ref 6–20)
CO2: 24 mmol/L (ref 22–32)
Calcium: 9.2 mg/dL (ref 8.9–10.3)
Chloride: 105 mmol/L (ref 98–111)
Creatinine: 0.99 mg/dL (ref 0.61–1.24)
GFR, Estimated: >60 mL/min (ref >60)
Glucose, Bld: 76 mg/dL (ref 70–99)
Potassium: 4.3 mmol/L (ref 3.5–5.1)
Sodium: 137 mmol/L (ref 135–145)
Total Bilirubin: 0.4 mg/dL (ref 0.3–1.2)
Total Protein: 7.3 g/dL (ref 6.5–8.1)

## 2021-01-16 LAB — TOTAL PROTEIN, URINE DIPSTICK: Protein, ur: NEGATIVE mg/dL

## 2021-01-16 MED ORDER — PALONOSETRON HCL INJECTION 0.25 MG/5ML
0.2500 mg | Freq: Once | INTRAVENOUS | Status: AC
  Administered 2021-01-16: 0.25 mg via INTRAVENOUS

## 2021-01-16 MED ORDER — PALONOSETRON HCL INJECTION 0.25 MG/5ML
INTRAVENOUS | Status: AC
  Filled 2021-01-16: qty 5

## 2021-01-16 MED ORDER — OXALIPLATIN CHEMO INJECTION 100 MG/20ML
85.0000 mg/m2 | Freq: Once | INTRAVENOUS | Status: AC
  Administered 2021-01-16: 200 mg via INTRAVENOUS
  Filled 2021-01-16: qty 40

## 2021-01-16 MED ORDER — SODIUM CHLORIDE 0.9 % IV SOLN
10.0000 mg | Freq: Once | INTRAVENOUS | Status: AC
  Administered 2021-01-16: 10 mg via INTRAVENOUS
  Filled 2021-01-16: qty 10

## 2021-01-16 MED ORDER — SODIUM CHLORIDE 0.9% FLUSH
10.0000 mL | Freq: Once | INTRAVENOUS | Status: AC
  Administered 2021-01-16: 10 mL
  Filled 2021-01-16: qty 10

## 2021-01-16 MED ORDER — PROCHLORPERAZINE MALEATE 10 MG PO TABS: 10.0000 mg | ORAL_TABLET | Freq: Four times a day (QID) | ORAL | 2 refills | Status: DC | PRN

## 2021-01-16 MED ORDER — SODIUM CHLORIDE 0.9 % IV SOLN
2400.0000 mg/m2 | INTRAVENOUS | Status: DC
  Administered 2021-01-16: 5700 mg via INTRAVENOUS
  Filled 2021-01-16: qty 114

## 2021-01-16 MED ORDER — LEUCOVORIN CALCIUM INJECTION 350 MG
400.0000 mg/m2 | Freq: Once | INTRAVENOUS | Status: AC
  Administered 2021-01-16: 948 mg via INTRAVENOUS
  Filled 2021-01-16: qty 47.4

## 2021-01-16 MED ORDER — ATROPINE SULFATE 1 MG/ML IJ SOLN
0.4000 mg | Freq: Once | INTRAMUSCULAR | Status: AC
  Administered 2021-01-16: 0.4 mg via INTRAVENOUS

## 2021-01-16 MED ORDER — DEXTROSE 5 % IV SOLN
Freq: Once | INTRAVENOUS | Status: AC
  Filled 2021-01-16: qty 250

## 2021-01-16 MED ORDER — ONDANSETRON HCL 8 MG PO TABS: 8.0000 mg | ORAL_TABLET | Freq: Two times a day (BID) | ORAL | 2 refills | Status: DC | PRN

## 2021-01-16 MED ORDER — ATROPINE SULFATE 0.4 MG/ML IJ SOLN
INTRAMUSCULAR | Status: AC
  Filled 2021-01-16: qty 1

## 2021-01-16 NOTE — Progress Notes (Signed)
Nutrition follow-up completed with patient during infusion for metastatic colon cancer status post partial bowel resection with colostomy on March 18. Weight decreased and documented as 228.4 pounds May 5 down from 234 pounds April 8. He reports everything is "perfect " He does have some cold sensitivity but has been able to tolerate a little ice cream. He reports things are much better now that he is resume soft diet. He denies nausea, vomiting, constipation, and diarrhea. States he is eating well.  Nutrition diagnosis: Food and nutrition related knowledge deficit improved.  Intervention: Continue small frequent meals and snacks.   Consume soft low fiber diet as tolerated. Continue bowel regimen. Consider Ensure or equivalent for additional calories and protein.  Monitoring, evaluation, goals: Will monitor oral intake to minimize weight loss.  Next visit: Thursday, June 16 during infusion.  **Disclaimer: This note was dictated with voice recognition software. Similar sounding words can inadvertently be transcribed and this note may contain transcription errors which may not have been corrected upon publication of note.**

## 2021-01-16 NOTE — Patient Instructions (Signed)
[facility] MEDICAL [facility]COLOGY  Discharge Instructions: ?Thank you for choosing [facility] to provide your oncology and hematology care.  ? ?If you have a lab appointment with the Cancer Center, please go directly to the Cancer Center and check in at the registration area. ?  ?Wear comfortable clothing and clothing appropriate for easy access to any Portacath or PICC line.  ? ?We strive to give you quality time with your provider. You may need to reschedule your appointment if you arrive late (15 or more minutes).  Arriving late affects you and other patients whose appointments are after yours.  Also, if you miss three or more appointments without notifying the office, you may be dismissed from the clinic at the provider?s discretion.    ?  ?For prescription refill requests, have your pharmacy contact our office and allow 72 hours for refills to be completed.   ? ?Today you received the following chemotherapy and/or immunotherapy agents oxaliplatin, leucovorin, fluorourcil    ?  ?To help prevent nausea and vomiting after your treatment, we encourage you to take your nausea medication as directed. ? ?BELOW ARE SYMPTOMS THAT SHOULD BE REPORTED IMMEDIATELY: ?*FEVER GREATER THAN 100.4 F (38 ?C) OR HIGHER ?*CHILLS OR SWEATING ?*NAUSEA AND VOMITING THAT IS NOT CONTROLLED WITH YOUR NAUSEA MEDICATION ?*UNUSUAL SHORTNESS OF BREATH ?*UNUSUAL BRUISING OR BLEEDING ?*URINARY PROBLEMS (pain or burning when urinating, or frequent urination) ?*BOWEL PROBLEMS (unusual diarrhea, constipation, pain near the anus) ?TENDERNESS IN MOUTH AND THROAT WITH OR WITHOUT PRESENCE OF ULCERS (sore throat, sores in mouth, or a toothache) ?UNUSUAL RASH, SWELLING OR PAIN  ?UNUSUAL VAGINAL DISCHARGE OR ITCHING  ? ?Items with * indicate a potential emergency and should be followed up as soon as possible or go to the Emergency Department if any problems should occur. ? ?Please show the CHEMOTHERAPY ALERT CARD or IMMUNOTHERAPY  ALERT CARD at check-in to the Emergency Department and triage nurse. ? ?Should you have questions after your visit or need to cancel or reschedule your appointment, please contact [facility]  Dept: [phone]  and follow the prompts.  Office hours are 8:00 a.m. to 4:30 p.m. Monday - Friday. Please note that voicemails left after 4:00 p.m. may not be returned until the following business day.  We are closed weekends and major holidays. You have access to a nurse at all times for urgent questions. Please call the main number to the clinic Dept: [phone] and follow the prompts. ? ? ?For any non-urgent questions, you may also contact your provider using MyChart. We now offer e-Visits for anyone 18 and older to request care online for non-urgent symptoms. For details visit [email or web address]. ?  ?Also download the MyChart app! Go to the app store, search "MyChart", open the app, select [place], and log in with your MyChart username and password. ? ?Due to Covid, a mask is required upon entering the hospital/clinic. If you do not have a mask, one will be given to you upon arrival. For doctor visits, patients may have 1 support person aged 18 or older with them. For treatment visits, patients cannot have anyone with them due to current Covid guidelines and our immunocompromised population.  ? ?

## 2021-01-17 ENCOUNTER — Encounter: Payer: Self-pay | Admitting: Genetic Counselor

## 2021-01-17 ENCOUNTER — Telehealth: Payer: Self-pay | Admitting: Hematology

## 2021-01-17 ENCOUNTER — Inpatient Hospital Stay: Payer: BC Managed Care – PPO

## 2021-01-17 ENCOUNTER — Telehealth: Payer: Self-pay | Admitting: Genetic Counselor

## 2021-01-17 VITALS — BP 108/83 | HR 99 | Temp 97.2°F | Resp 19 | Ht 71.0 in | Wt 231.1 lb

## 2021-01-17 DIAGNOSIS — C182 Malignant neoplasm of ascending colon: Secondary | ICD-10-CM

## 2021-01-17 DIAGNOSIS — C772 Secondary and unspecified malignant neoplasm of intra-abdominal lymph nodes: Secondary | ICD-10-CM

## 2021-01-17 DIAGNOSIS — Z1379 Encounter for other screening for genetic and chromosomal anomalies: Secondary | ICD-10-CM | POA: Insufficient documentation

## 2021-01-17 MED ORDER — SODIUM CHLORIDE 0.9 % IV SOLN
Freq: Once | INTRAVENOUS | Status: AC
  Filled 2021-01-17: qty 250

## 2021-01-17 NOTE — Telephone Encounter (Signed)
Scheduled follow-up appointments per 5/5 los. Patient is aware. 

## 2021-01-17 NOTE — Patient Instructions (Signed)
Rehydration, Adult Rehydration is the replacement of body fluids, salts, and minerals (electrolytes) that are lost during dehydration. Dehydration is when there is not enough water or other fluids in the body. This happens when you lose more fluids than you take in. Common causes of dehydration include:  Not drinking enough fluids. This can occur when you are ill or doing activities that require a lot of energy, especially in hot weather.  Conditions that cause loss of water or other fluids, such as diarrhea, vomiting, sweating, or urinating a lot.  Other illnesses, such as fever or infection.  Certain medicines, such as those that remove excess fluid from the body (diuretics). Symptoms of mild or moderate dehydration may include thirst, dry lips and mouth, and dizziness. Symptoms of severe dehydration may include increased heart rate, confusion, fainting, and not urinating. For severe dehydration, you may need to get fluids through an IV at the hospital. For mild or moderate dehydration, you can usually rehydrate at home by drinking certain fluids as told by your health care provider. What are the risks? Generally, rehydration is safe. However, taking in too much fluid (overhydration) can be a problem. This is rare. Overhydration can cause an electrolyte imbalance, kidney failure, or a decrease in salt (sodium) levels in the body. Supplies needed You will need an oral rehydration solution (ORS) if your health care provider tells you to use one. This is a drink to treat dehydration. It can be found in pharmacies and retail stores. How to rehydrate Fluids Follow instructions from your health care provider for rehydration. The kind of fluid and the amount you should drink depend on your condition. In general, you should choose drinks that you prefer.  If told by your health care provider, drink an ORS. ? Make an ORS by following instructions on the package. ? Start by drinking small amounts,  about  cup (120 mL) every 5-10 minutes. ? Slowly increase how much you drink until you have taken the amount recommended by your health care provider.  Drink enough clear fluids to keep your urine pale yellow. If you were told to drink an ORS, finish it first, then start slowly drinking other clear fluids. Drink fluids such as: ? Water. This includes sparkling water and flavored water. Drinking only water can lead to having too little sodium in your body (hyponatremia). Follow the advice of your health care provider. ? Water from ice chips you suck on. ? Fruit juice with water you add to it (diluted). ? Sports drinks. ? Hot or cold herbal teas. ? Broth-based soups. ? Milk or milk products. Food Follow instructions from your health care provider about what to eat while you rehydrate. Your health care provider may recommend that you slowly begin eating regular foods in small amounts.  Eat foods that contain a healthy balance of electrolytes, such as bananas, oranges, potatoes, tomatoes, and spinach.  Avoid foods that are greasy or contain a lot of sugar. In some cases, you may get nutrition through a feeding tube that is passed through your nose and into your stomach (nasogastric tube, or NG tube). This may be done if you have uncontrolled vomiting or diarrhea.   Beverages to avoid Certain beverages may make dehydration worse. While you rehydrate, avoid drinking alcohol.   How to tell if you are recovering from dehydration You may be recovering from dehydration if:  You are urinating more often than before you started rehydrating.  Your urine is pale yellow.  Your energy level   improves.  You vomit less frequently.  You have diarrhea less frequently.  Your appetite improves or returns to normal.  You feel less dizzy or less light-headed.  Your skin tone and color start to look more normal. Follow these instructions at home:  Take over-the-counter and prescription medicines only  as told by your health care provider.  Do not take sodium tablets. Doing this can lead to having too much sodium in your body (hypernatremia). Contact a health care provider if:  You continue to have symptoms of mild or moderate dehydration, such as: ? Thirst. ? Dry lips. ? Slightly dry mouth. ? Dizziness. ? Dark urine or less urine than normal. ? Muscle cramps.  You continue to vomit or have diarrhea. Get help right away if you:  Have symptoms of dehydration that get worse.  Have a fever.  Have a severe headache.  Have been vomiting and the following happens: ? Your vomiting gets worse or does not go away. ? Your vomit includes blood or green matter (bile). ? You cannot eat or drink without vomiting.  Have problems with urination or bowel movements, such as: ? Diarrhea that gets worse or does not go away. ? Blood in your stool (feces). This may cause stool to look black and tarry. ? Not urinating, or urinating only a small amount of very dark urine, within 6-8 hours.  Have trouble breathing.  Have symptoms that get worse with treatment. These symptoms may represent a serious problem that is an emergency. Do not wait to see if the symptoms will go away. Get medical help right away. Call your local emergency services (911 in the [place] Do not drive yourself to the hospital. Summary  Rehydration is the replacement of body fluids and minerals (electrolytes) that are lost during dehydration.  Follow instructions from your health care provider for rehydration. The kind of fluid and amount you should drink depend on your condition.  Slowly increase how much you drink until you have taken the amount recommended by your health care provider.  Contact your health care provider if you continue to show signs of mild or moderate dehydration. This information is not intended to replace advice given to you by your health care provider. Make sure you discuss any questions you have with  your health care provider. Document Revised: 11/01/2019 Document Reviewed: 09/11/2019 Elsevier Patient Education  2021 [employer] Inc.  

## 2021-01-17 NOTE — Telephone Encounter (Signed)
LM on VM that results were back and to please call.  Left CB instructions. 

## 2021-01-18 ENCOUNTER — Other Ambulatory Visit: Payer: Self-pay

## 2021-01-18 ENCOUNTER — Inpatient Hospital Stay: Payer: BC Managed Care – PPO

## 2021-01-18 VITALS — BP 118/84 | HR 85 | Temp 97.4°F | Resp 18

## 2021-01-18 DIAGNOSIS — C182 Malignant neoplasm of ascending colon: Secondary | ICD-10-CM | POA: Diagnosis not present

## 2021-01-18 MED ORDER — SODIUM CHLORIDE 0.9% FLUSH
10.0000 mL | INTRAVENOUS | Status: DC | PRN
  Administered 2021-01-18: 10 mL
  Filled 2021-01-18: qty 10

## 2021-01-18 MED ORDER — HEPARIN SOD (PORK) LOCK FLUSH 100 UNIT/ML IV SOLN
500.0000 [IU] | Freq: Once | INTRAVENOUS | Status: AC | PRN
  Administered 2021-01-18: 500 [IU]
  Filled 2021-01-18: qty 5

## 2021-01-20 ENCOUNTER — Other Ambulatory Visit: Payer: Self-pay

## 2021-01-20 ENCOUNTER — Telehealth: Payer: Self-pay

## 2021-01-20 DIAGNOSIS — C786 Secondary malignant neoplasm of retroperitoneum and peritoneum: Secondary | ICD-10-CM

## 2021-01-20 DIAGNOSIS — U071 COVID-19: Secondary | ICD-10-CM

## 2021-01-20 DIAGNOSIS — J398 Other specified diseases of upper respiratory tract: Secondary | ICD-10-CM

## 2021-01-20 DIAGNOSIS — C182 Malignant neoplasm of ascending colon: Secondary | ICD-10-CM

## 2021-01-20 MED ORDER — DICYCLOMINE HCL 10 MG PO CAPS: 10.0000 mg | ORAL_CAPSULE | Freq: Three times a day (TID) | ORAL | 1 refills | Status: DC

## 2021-01-20 NOTE — Telephone Encounter (Signed)
Patient took home Covid test which is positive, his symptoms started Saturday evening, I have instructed him to get a documented Covid PCR test, he will seek out to [facility] for this.  Once he gets the results I have asked him to send his results via My Chart so it becomes part of the chart.  I explained that someone will be reaching out to him regarding the monoclonal antibody treatment.  The patient verbalized an understanding.

## 2021-01-20 NOTE — Telephone Encounter (Signed)
Patient calls reporting he has a sinus infection, all in his nose and sinuses, no fever, he has gotten these before.  He wants to know what he can take for this.  Also he is asking for a refill on Bentyl, last refill 4/22 #30 however it is prescribed one four times a day.

## 2021-01-21 ENCOUNTER — Other Ambulatory Visit: Payer: Self-pay | Admitting: Adult Health

## 2021-01-21 ENCOUNTER — Telehealth: Payer: Self-pay

## 2021-01-21 DIAGNOSIS — J1282 Pneumonia due to coronavirus disease 2019: Secondary | ICD-10-CM

## 2021-01-21 DIAGNOSIS — U071 COVID-19: Secondary | ICD-10-CM

## 2021-01-21 NOTE — Progress Notes (Signed)
I connected by phone with Ricardo [name] on 01/21/2021 at 5:17 PM to discuss the potential use of a new treatment for mild to moderate COVID-19 viral infection in non-hospitalized patients.  This patient is a 55 y.o. male that meets the FDA criteria for Emergency Use Authorization of COVID monoclonal antibody bebtelovimab.  Has a (+) direct SARS-CoV-2 viral test result  Has mild or moderate COVID-19   Is NOT hospitalized due to COVID-19  Is within 10 days of symptom onset  Has at least one of the high risk factor(s) for progression to severe COVID-19 and/or hospitalization as defined in EUA.  Specific high risk criteria : Immunosuppressive Disease or Treatment Sx onset 01/18/2021  I have spoken and communicated the following to the patient or parent/caregiver regarding COVID monoclonal antibody treatment:  1. FDA has authorized the emergency use for the treatment of mild to moderate COVID-19 in adults and pediatric patients with positive results of direct SARS-CoV-2 viral testing who are 32 years of age and older weighing at least 40 kg, and who are at high risk for progressing to severe COVID-19 and/or hospitalization.  2. The significant known and potential risks and benefits of COVID monoclonal antibody, and the extent to which such potential risks and benefits are unknown.  3. Information on available alternative treatments and the risks and benefits of those alternatives, including clinical trials.  4. Patients treated with COVID monoclonal antibody should continue to self-isolate and use infection control measures (e.g., wear mask, isolate, social distance, avoid sharing personal items, clean and disinfect "high touch" surfaces, and frequent handwashing) according to CDC guidelines.   5. The patient or parent/caregiver has the option to accept or refuse COVID monoclonal antibody treatment.  6. Discussion about the monoclonal antibody infusion does not ensure treatment. The patient  will be placed on a list and scheduled according to risk, symptom onset and availability. A scheduler will reach to the patient to let them know if we can accommodate their infusion or not.  After reviewing this information with the patient, the patient has agreed to receive one of the available covid 19 monoclonal antibodies and will be provided an appropriate fact sheet prior to infusion.  Scot Dock, NP 01/21/2021 5:17 PM

## 2021-01-21 NOTE — Telephone Encounter (Signed)
Called to discuss with patient about COVID-19 symptoms and the use of one of the available treatments for those with mild to moderate Covid symptoms and at a high risk of hospitalization.  Pt appears to qualify for outpatient treatment due to co-morbid conditions and/or a member of an at-risk group in accordance with the FDA Emergency Use Authorization.    Symptom onset: 01/18/21 sinus symptoms Vaccinated: Yes Booster? Unknown Immunocompromised? Yes Qualifiers: Cecal cancer with metastasis  NIH Criteria: Tier 1  Unable to reach pt - Left message and call back number [phone].   Ricardo [name]

## 2021-01-24 ENCOUNTER — Ambulatory Visit (INDEPENDENT_AMBULATORY_CARE_PROVIDER_SITE_OTHER): Payer: BC Managed Care – PPO

## 2021-01-24 DIAGNOSIS — J1282 Pneumonia due to coronavirus disease 2019: Secondary | ICD-10-CM

## 2021-01-24 DIAGNOSIS — U071 COVID-19: Secondary | ICD-10-CM | POA: Diagnosis not present

## 2021-01-24 MED ORDER — EPINEPHRINE 0.3 MG/0.3ML IJ SOAJ: 0.3000 mg | Freq: Once | INTRAMUSCULAR | Status: AC | PRN

## 2021-01-24 MED ORDER — BEBTELOVIMAB 175 MG/2 ML IV (EUA)
175.0000 mg | Freq: Once | INTRAMUSCULAR | Status: AC
  Administered 2021-01-24: 175 mg via INTRAVENOUS

## 2021-01-24 MED ORDER — SODIUM CHLORIDE 0.9 % IV SOLN: INTRAVENOUS | Status: DC | PRN

## 2021-01-24 MED ORDER — FAMOTIDINE IN NACL 20-0.9 MG/50ML-% IV SOLN: 20.0000 mg | Freq: Once | INTRAVENOUS | Status: AC | PRN

## 2021-01-24 MED ORDER — DIPHENHYDRAMINE HCL 50 MG/ML IJ SOLN: 50.0000 mg | Freq: Once | INTRAMUSCULAR | Status: AC | PRN

## 2021-01-24 MED ORDER — METHYLPREDNISOLONE SODIUM SUCC 125 MG IJ SOLR: 125.0000 mg | Freq: Once | INTRAMUSCULAR | Status: AC | PRN

## 2021-01-24 MED ORDER — ALBUTEROL SULFATE HFA 108 (90 BASE) MCG/ACT IN AERS: 2.0000 | INHALATION_SPRAY | Freq: Once | RESPIRATORY_TRACT | Status: AC | PRN

## 2021-01-24 NOTE — Telephone Encounter (Signed)
LM on VM that results were back and to please call. ?

## 2021-01-24 NOTE — Progress Notes (Signed)
Diagnosis: COVID  Provider:  Marshell Garfinkel, MD  Procedure: Infusion  IV Type: Peripheral, IV Location: R Hand  Bebtelovimab, Dose: 175 mg  Infusion Start Time: 1430  Infusion Stop Time: 2992  Post Infusion IV Care: Observation period completed, IV discontinued  Discharge: Condition: Good, Destination: Home . AVS provided to patient.   Performed by:  Janine Ores, RN

## 2021-01-24 NOTE — Patient Instructions (Signed)
10 Things You Can Do to Manage Your COVID-19 Symptoms at Home If you have possible or confirmed COVID-19: 1. Stay home except to get medical care. 2. Monitor your symptoms carefully. If your symptoms get worse, call your healthcare provider immediately. 3. Get rest and stay hydrated. 4. If you have a medical appointment, call the healthcare provider ahead of time and tell them that you have or may have COVID-19. 5. For medical emergencies, call 911 and notify the dispatch personnel that you have or may have COVID-19. 6. Cover your cough and sneezes with a tissue or use the inside of your elbow. 7. Wash your hands often with soap and water for at least 20 seconds or clean your hands with an alcohol-based hand sanitizer that contains at least 60% alcohol. 8. As much as possible, stay in a specific room and away from other people in your home. Also, you should use a separate bathroom, if available. If you need to be around other people in or outside of the home, wear a mask. 9. Avoid sharing personal items with other people in your household, like dishes, towels, and bedding. 10. Clean all surfaces that are touched often, like counters, tabletops, and doorknobs. Use household cleaning sprays or wipes according to the label instructions. cdc.gov/coronavirus 03/29/2020 This information is not intended to replace advice given to you by your health care provider. Make sure you discuss any questions you have with your health care provider. Document Revised: 07/15/2020 Document Reviewed: 07/15/2020 Elsevier Patient Education  2021 Else[employer] Inc.  What types of side effects do monoclonal antibody drugs cause?  Common side effects  In general, the more common side effects caused by monoclonal antibody drugs include: . Allergic reactions, such as hives or itching . Flu-like signs and symptoms, including chills, fatigue, fever, and muscle aches and pains . Nausea, vomiting . Diarrhea . Skin  rashes . Low blood pressure   The CDC is recommending patients who receive monoclonal antibody treatments wait at least 90 days before being vaccinated.  Currently, there are no data on the safety and efficacy of mRNA COVID-19 vaccines in persons who received monoclonal antibodies or convalescent plasma as part of COVID-19 treatment. Based on the estimated half-life of such therapies as well as evidence suggesting that reinfection is uncommon in the 90 days after initial infection, vaccination should be deferred for at least 90 days, as a precautionary measure until additional information becomes available, to avoid interference of the antibody treatment with vaccine-induced immune responses.   If someone you know is interested in receiving treatment please have them contact their MD for a referral or visit [email or web address]    

## 2021-01-28 ENCOUNTER — Ambulatory Visit: Payer: Self-pay | Admitting: Genetic Counselor

## 2021-01-28 DIAGNOSIS — Z1379 Encounter for other screening for genetic and chromosomal anomalies: Secondary | ICD-10-CM

## 2021-01-28 DIAGNOSIS — C772 Secondary and unspecified malignant neoplasm of intra-abdominal lymph nodes: Secondary | ICD-10-CM

## 2021-01-28 DIAGNOSIS — C182 Malignant neoplasm of ascending colon: Secondary | ICD-10-CM

## 2021-01-28 NOTE — Telephone Encounter (Signed)
Revealed negative genetic testing.  Discussed that we do not know why he has colon cancer or why there is cancer in the family. It could be due to a different gene that we are not testing, or maybe our current technology may not be able to pick something up.  It will be important for him to keep in contact with genetics to keep up with whether additional testing may be needed.  Three VUS identified.  No medical management changes recommended.

## 2021-01-28 NOTE — Progress Notes (Signed)
HPI:  Ricardo [name] was previously seen in the [facility] due to a personal and family history of cancer and concerns regarding a hereditary predisposition to cancer. Please refer to our prior cancer genetics clinic note for more information regarding our discussion, assessment and recommendations, at the time. Ricardo [name] recent genetic test results were disclosed to him, as were recommendations warranted by these results. These results and recommendations are discussed in more detail below.  CANCER HISTORY:  Oncology History Overview Note  Cancer Staging No matching staging information was found for the patient.    metastatic cecal cancer  11/27/2020 Imaging   CT Angio CAP  IMPRESSION: 1. Thickening of the terminal ileum with associated proximal mid to distal small bowel obstruction. Findings could be due to an ileitis versus malignancy. Nonspecific mesenteric edema could be due to engorgement versus metastases. No associated bowel perforation. Recommend endoscopy for further evaluation. 2. Indeterminate right lower quadrant lymphadenopathy. 3. Scattered colonic diverticulosis with no acute diverticulitis. 4. Stable right hepatic lobe subcentimeter hyperdensity likely represents a hepatic hemangioma. 5. No acute vascular abnormality. Aortic Atherosclerosis (ICD10-I70.0) - mild. 6. No acute intrathoracic abnormality.   11/28/2020 Imaging   CT AP  IMPRESSION: 1. There is masslike, circumferential thickening of the terminal ileum and cecal base near the ileocecal valve and abnormally enlarged lymph nodes in the right lower quadrant mesentery adjacent to the terminal ileum measuring up to 2.3 x 1.4 cm. 2. There is extensive omental and peritoneal nodularity and caking throughout the abdomen. 3. Findings are highly concerning for primary colon malignancy with nodal and peritoneal metastatic disease. 4. Small volume perihepatic and perisplenic ascites. 5. The small  bowel is generally decompressed, with full some fluid-filled, nondistended loops throughout. There is transit of oral enteric contrast to the terminal ileum. No evidence of overt bowel obstruction at this time. Esophagogastric tube is position with tip and side port below the diaphragm. 6. Atelectasis or consolidation of the dependent bilateral lung bases, new compared to prior examination.   Aortic Atherosclerosis (ICD10-I70.0).     11/29/2020 Surgery   EXPLORATORY LAPAROTOMY, PARTIAL BOWEL RESECTION, POSSIBLE OSTOMY CREATION, PERITIONEAL BIOPSY, INSERTION OF GASTROSTOMY TUBE by Dr [name]   11/29/2020 Initial Biopsy   FINAL MICROSCOPIC DIAGNOSIS:   AB. 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    COMMENT:   Immunohistochemistry (IHC) for CK20 and CDX-2 is strong and diffusely  positive.  CK7, TTF-1, Synaptophysin, Chromogranin and CD56 are  negative.  The immunophenotype is compatible with origin from the lower  gastrointestinal tract.  IHC for MMR will be reported separately.  Case  preliminarily discussed with Dr. [name] on 12/02/2020.   At the request of Dr. [name], [phone] was reviewed in retrospect.  Review of the submitted sections confirms the presence of acute  appendicitis.  No malignancy is identified.    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 Wall thickening of the terminal ileum again seen. Fluid-filled distal small bowel, ascending, and transverse colon without obstruction. 2. Equivocal improvement in omental and peritoneal metastatic disease with slightly improved small volume perihepatic and perisplenic ascites. Right lower quadrant mesenteric adenopathy is similar or mildly improved. 3. Sigmoid colonic diverticulosis. Mild mural wall thickening in the sigmoid, no acute diverticulitis. 4. Gastrostomy tube in place, balloon normally positioned in the stomach. 5. Chronic bilateral L5 pars interarticularis defects with trace anterolisthesis of L5 on S1. Chronic avascular necrosis of the left femoral head.   01/16/2021 Genetic Testing   Negative genetic testing. CTNNA1 [id number] VUS, RAD51D p.G140E VUS and TSC1 p.R768H VUS found on the CancerNext-Expanded+RNAinsight.  The CancerNext-Expanded gene panel offered by [facility] and includes sequencing and rearrangement analysis for the following 77 genes: AIP, ALK, APC*, ATM*, AXIN2, BAP1, BARD1, BLM, BMPR1A, BRCA1*, BRCA2*, BRIP1*, CDC73, CDH1*, CDK4, CDKN1B, CDKN2A, CHEK2*, CTNNA1, DICER1, FANCC, FH, FLCN, GALNT12, KIF1B, LZTR1, MAX, MEN1, MET, MLH1*, MSH2*, MSH3, MSH6*, MUTYH*, NBN, NF1*, NF2, NTHL1, PALB2*, PHOX2B, PMS2*, POT1, PRKAR1A, PTCH1, PTEN*, RAD51C*, RAD51D*, RB1, RECQL, RET, SDHA, SDHAF2, SDHB, SDHC, SDHD, SMAD4, SMARCA4, SMARCB1, SMARCE1, STK11, SUFU, TMEM127, TP53*, TSC1, TSC2, VHL and XRCC2 (sequencing and deletion/duplication); EGFR, EGLN1, HOXB13, KIT, MITF, PDGFRA, POLD1, and POLE (sequencing only); EPCAM and GREM1 (deletion/duplication only). DNA and RNA analyses performed for * genes. The report date is Jan 16, 2021.     FAMILY HISTORY:  We obtained a detailed, 4-generation  family history.  Significant diagnoses are listed below: Family History  Problem Relation Age of Onset  . Cancer Sister 9       breast cancer  . Cancer Brother 66       prostate cancer   . High Cholesterol Brother   . Pancreatic cancer Maternal Uncle   . Bone cancer Cousin        pat first cousin  . Colon cancer Neg Hx   . Liver disease Neg Hx   . Esophageal cancer Neg Hx   . Stomach cancer Neg Hx     The patient has one son who is cancer free.  He has two brothers and two sisters.  One sister had breast cancer at 40 and one brother had both prostate cancer and skin cancer at 55.  Both parents are deceased.  The patient's mother was adopted.  She had one full brother who had pancreatic cancer.  There is no other known cancer in the maternal family.  The patient's father died of heart failure.  He had a brother and a paternal half sister who are cancer free.  The brother had a son who had bone cancer at 7. The paternal grandparents are deceased.  Ricardo [name] is unaware of previous family history of genetic testing for hereditary cancer risks. Patient's maternal ancestors are of [place] descent, and paternal ancestors are of [place] and [place] descent. There is no reported Ashkenazi Jewish ancestry. There is no known consanguinity.  GENETIC TEST RESULTS: Genetic testing reported out on Jan 16, 2021 through the CancerNext-Expanded+RNAinsight cancer panel found no pathogenic mutations. The CancerNext-Expanded gene panel offered by [facility] and includes sequencing and rearrangement analysis for the following 77 genes: AIP, ALK, APC*, ATM*, AXIN2, BAP1, BARD1, BLM, BMPR1A, BRCA1*, BRCA2*, BRIP1*, CDC73, CDH1*, CDK4, CDKN1B, CDKN2A,  CHEK2*, CTNNA1, DICER1, FANCC, FH, FLCN, GALNT12, KIF1B, LZTR1, MAX, MEN1, MET, MLH1*, MSH2*, MSH3, MSH6*, MUTYH*, NBN, NF1*, NF2, NTHL1, PALB2*, PHOX2B, PMS2*, POT1, PRKAR1A, PTCH1, PTEN*, RAD51C*, RAD51D*, RB1, RECQL, RET, SDHA, SDHAF2, SDHB, SDHC, SDHD,  SMAD4, SMARCA4, SMARCB1, SMARCE1, STK11, SUFU, TMEM127, TP53*, TSC1, TSC2, VHL and XRCC2 (sequencing and deletion/duplication); EGFR, EGLN1, HOXB13, KIT, MITF, PDGFRA, POLD1, and POLE (sequencing only); EPCAM and GREM1 (deletion/duplication only). DNA and RNA analyses performed for * genes. The test report has been scanned into EPIC and is located under the Molecular Pathology section of the Results Review tab.  A portion of the result report is included below for reference.     We discussed with Ricardo [name] that because current genetic testing is not perfect, it is possible there may be a gene mutation in one of these genes that current testing cannot detect, but that chance is small.  We also discussed, that there could be another gene that has not yet been discovered, or that we have not yet tested, that is responsible for the cancer diagnoses in the family. It is also possible there is a hereditary cause for the cancer in the family that Ricardo [name] did not inherit and therefore was not identified in his testing.  Therefore, it is important to remain in touch with cancer genetics in the future so that we can continue to offer Ricardo [name] the most up to date genetic testing.   Genetic testing did identify three Variants of uncertain significance (VUS) - one in the CTNNA1 gene called [id number], a second in the RAD51D gene called p.G140E, and a third in the TSC1 gene called p.R768H.  At this time, it is unknown if these variants are associated with increased cancer risk or if they are normal findings, but most variants such as these get reclassified to being inconsequential. They should not be used to make medical management decisions. With time, we suspect the lab will determine the significance of these variants, if any. If we do learn more about them, we will try to contact Ricardo [name] to discuss it further. However, it is important to stay in touch with [place] periodically and keep the address and phone  number up to date.  ADDITIONAL GENETIC TESTING: We discussed with Ricardo [name] that his genetic testing was fairly extensive.  If there are genes identified to increase cancer risk that can be analyzed in the future, we would be happy to discuss and coordinate this testing at that time.    CANCER SCREENING RECOMMENDATIONS: Ricardo [name] test result is considered negative (normal).  This means that we have not identified a hereditary cause for his personal and family history of cancer at this time. Most cancers happen by chance and this negative test suggests that his cancer may fall into this category.    While reassuring, this does not definitively rule out a hereditary predisposition to cancer. It is still possible that there could be genetic mutations that are undetectable by current technology. There could be genetic mutations in genes that have not been tested or identified to increase cancer risk.  Therefore, it is recommended he continue to follow the cancer management and screening guidelines provided by his oncology and primary healthcare provider.   An individual's cancer risk and medical management are not determined by genetic test results alone. Overall cancer risk assessment incorporates additional factors, including personal medical history, family history, and any available genetic information that may result in a personalized plan for cancer prevention and  surveillance  RECOMMENDATIONS FOR FAMILY MEMBERS:  Individuals in this family might be at some increased risk of developing cancer, over the general population risk, simply due to the family history of cancer.  We recommended women in this family have a yearly mammogram beginning at age 79, or 16 years younger than the earliest onset of cancer, an annual clinical breast exam, and perform monthly breast self-exams. Women in this family should also have a gynecological exam as recommended by their primary provider. All family members should be  referred for colonoscopy starting at age 83.  It is also possible there is a hereditary cause for the cancer in Ricardo [name]'s family that he did not inherit and therefore was not identified in him.  Based on Ricardo [name]'s family history, we recommended his brother, who was diagnosed with prostate and melanoma, or his sister who was diagnosed with metastatic breast cancer, have genetic counseling and testing. Ricardo [name] will let us know if we can be of any assistance in coordinating genetic counseling and/or testing for this family member.   FOLLOW-UP: Lastly, we discussed with Ricardo [name] that cancer genetics is a rapidly advancing field and it is possible that new genetic tests will be appropriate for him and/or his family members in the future. We encouraged him to remain in contact with cancer genetics on an annual basis so we can update his personal and family histories and let him know of advances in cancer genetics that may benefit this family.   Our contact number was provided. Ricardo [name] questions were answered to his satisfaction, and he knows he is welcome to call us at anytime with additional questions or concerns.   Roma Kayser, [place], [facility] Licensed, Certified Genetic Counselor [email or web address]

## 2021-01-30 ENCOUNTER — Other Ambulatory Visit: Payer: Self-pay

## 2021-01-30 ENCOUNTER — Inpatient Hospital Stay (HOSPITAL_BASED_OUTPATIENT_CLINIC_OR_DEPARTMENT_OTHER): Payer: BC Managed Care – PPO | Admitting: Hematology

## 2021-01-30 ENCOUNTER — Inpatient Hospital Stay: Payer: BC Managed Care – PPO

## 2021-01-30 ENCOUNTER — Encounter: Payer: Self-pay | Admitting: Hematology

## 2021-01-30 VITALS — BP 103/76 | HR 75 | Temp 98.2°F | Resp 18

## 2021-01-30 DIAGNOSIS — C182 Malignant neoplasm of ascending colon: Secondary | ICD-10-CM

## 2021-01-30 DIAGNOSIS — C772 Secondary and unspecified malignant neoplasm of intra-abdominal lymph nodes: Secondary | ICD-10-CM

## 2021-01-30 DIAGNOSIS — C786 Secondary malignant neoplasm of retroperitoneum and peritoneum: Secondary | ICD-10-CM

## 2021-01-30 LAB — CMP (CANCER CENTER ONLY)
ALT: 24 U/L (ref 0–44)
AST: 19 U/L (ref 15–41)
Albumin: 3.5 g/dL (ref 3.5–5.0)
Alkaline Phosphatase: 93 U/L (ref 38–126)
Anion gap: 9 (ref 5–15)
BUN: 11 mg/dL (ref 6–20)
CO2: 25 mmol/L (ref 22–32)
Calcium: 9 mg/dL (ref 8.9–10.3)
Chloride: 106 mmol/L (ref 98–111)
Creatinine: 0.95 mg/dL (ref 0.61–1.24)
GFR, Estimated: >60 mL/min (ref >60)
Glucose, Bld: 85 mg/dL (ref 70–99)
Potassium: 4 mmol/L (ref 3.5–5.1)
Sodium: 140 mmol/L (ref 135–145)
Total Bilirubin: 0.5 mg/dL (ref 0.3–1.2)
Total Protein: 6.9 g/dL (ref 6.5–8.1)

## 2021-01-30 LAB — CBC WITH DIFFERENTIAL (CANCER CENTER ONLY)
Abs Immature Granulocytes: 0.03 10*3/uL (ref 0.00–0.07)
Basophils Absolute: 0 10*3/uL (ref 0.0–0.1)
Basophils Relative: 0 %
Eosinophils Absolute: 0.1 10*3/uL (ref 0.0–0.5)
Eosinophils Relative: 1 %
HCT: 36.9 % — ABNORMAL LOW (ref 39.0–52.0)
Hemoglobin: 13 g/dL (ref 13.0–17.0)
Immature Granulocytes: 0 %
Lymphocytes Relative: 14 %
Lymphs Abs: 1.2 10*3/uL (ref 0.7–4.0)
MCH: 29 pg (ref 26.0–34.0)
MCHC: 35.2 g/dL (ref 30.0–36.0)
MCV: 82.4 fL (ref 80.0–100.0)
Monocytes Absolute: 0.8 10*3/uL (ref 0.1–1.0)
Monocytes Relative: 9 %
Neutro Abs: 6.3 10*3/uL (ref 1.7–7.7)
Neutrophils Relative %: 76 %
Platelet Count: 205 10*3/uL (ref 150–400)
RBC: 4.48 MIL/uL (ref 4.22–5.81)
RDW: 14.6 % (ref 11.5–15.5)
WBC Count: 8.4 10*3/uL (ref 4.0–10.5)
nRBC: 0 % (ref 0.0–0.2)

## 2021-01-30 MED ORDER — HEPARIN SOD (PORK) LOCK FLUSH 100 UNIT/ML IV SOLN
500.0000 [IU] | Freq: Once | INTRAVENOUS | Status: DC | PRN
  Filled 2021-01-30: qty 5

## 2021-01-30 MED ORDER — SODIUM CHLORIDE 0.9 % IV SOLN
5.0000 mg/kg | Freq: Once | INTRAVENOUS | Status: AC
  Administered 2021-01-30: 500 mg via INTRAVENOUS
  Filled 2021-01-30: qty 16

## 2021-01-30 MED ORDER — DEXTROSE 5 % IV SOLN
Freq: Once | INTRAVENOUS | Status: AC
  Filled 2021-01-30: qty 250

## 2021-01-30 MED ORDER — SODIUM CHLORIDE 0.9% FLUSH
10.0000 mL | INTRAVENOUS | Status: DC | PRN
  Filled 2021-01-30: qty 10

## 2021-01-30 MED ORDER — LEUCOVORIN CALCIUM INJECTION 350 MG
400.0000 mg/m2 | Freq: Once | INTRAVENOUS | Status: AC
  Administered 2021-01-30: 948 mg via INTRAVENOUS
  Filled 2021-01-30: qty 47.4

## 2021-01-30 MED ORDER — SODIUM CHLORIDE 0.9 % IV SOLN
Freq: Once | INTRAVENOUS | Status: AC
Start: 2021-01-30 — End: 2021-01-30
  Filled 2021-01-30: qty 250

## 2021-01-30 MED ORDER — OXYCODONE HCL 5 MG PO TABS: 5.0000 mg | ORAL_TABLET | Freq: Four times a day (QID) | ORAL | 0 refills | Status: DC | PRN

## 2021-01-30 MED ORDER — OXALIPLATIN CHEMO INJECTION 100 MG/20ML
85.0000 mg/m2 | Freq: Once | INTRAVENOUS | Status: AC
  Administered 2021-01-30: 200 mg via INTRAVENOUS
  Filled 2021-01-30: qty 40

## 2021-01-30 MED ORDER — PALONOSETRON HCL INJECTION 0.25 MG/5ML
0.2500 mg | Freq: Once | INTRAVENOUS | Status: AC
  Administered 2021-01-30: 0.25 mg via INTRAVENOUS

## 2021-01-30 MED ORDER — ZOLPIDEM TARTRATE 5 MG PO TABS
5.0000 mg | ORAL_TABLET | Freq: Every evening | ORAL | 0 refills | Status: DC | PRN
Start: 2021-01-30 — End: 2021-08-27

## 2021-01-30 MED ORDER — SODIUM CHLORIDE 0.9 % IV SOLN
10.0000 mg | Freq: Once | INTRAVENOUS | Status: AC
  Administered 2021-01-30: 10 mg via INTRAVENOUS
  Filled 2021-01-30: qty 10

## 2021-01-30 MED ORDER — SODIUM CHLORIDE 0.9 % IV SOLN
2400.0000 mg/m2 | INTRAVENOUS | Status: DC
  Administered 2021-01-30: 5700 mg via INTRAVENOUS
  Filled 2021-01-30: qty 114

## 2021-01-30 MED ORDER — PALONOSETRON HCL INJECTION 0.25 MG/5ML
INTRAVENOUS | Status: AC
  Filled 2021-01-30: qty 5

## 2021-01-30 NOTE — Progress Notes (Signed)
[place]   [phone] [phone]   Clinic Follow up Note   Patient Care Team: [name], MD as PCP - General (Internal Medicine) [name], MD as Consulting Physician (Oncology) [name], RN as Oncology Nurse Navigator  Date of Service:  01/30/2021  CHIEF COMPLAINT: f/u of metastatic colon cancer  SUMMARY OF ONCOLOGIC HISTORY: Oncology History Overview Note  Cancer Staging No matching staging information was found for the patient.    metastatic cecal cancer  11/27/2020 Imaging   CT Angio CAP  IMPRESSION: 1. Thickening of the terminal ileum with associated proximal mid to distal small bowel obstruction. Findings could be due to an ileitis versus malignancy. Nonspecific mesenteric edema could be due to engorgement versus metastases. No associated bowel perforation. Recommend endoscopy for further evaluation. 2. Indeterminate right lower quadrant lymphadenopathy. 3. Scattered colonic diverticulosis with no acute diverticulitis. 4. Stable right hepatic lobe subcentimeter hyperdensity likely represents a hepatic hemangioma. 5. No acute vascular abnormality. Aortic Atherosclerosis (ICD10-I70.0) - mild. 6. No acute intrathoracic abnormality.   11/28/2020 Imaging   CT AP  IMPRESSION: 1. There is masslike, circumferential thickening of the terminal ileum and cecal base near the ileocecal valve and abnormally enlarged lymph nodes in the right lower quadrant mesentery adjacent to the terminal ileum measuring up to 2.3 x 1.4 cm. 2. There is extensive omental and peritoneal nodularity and caking throughout the abdomen. 3. Findings are highly concerning for primary colon malignancy with nodal and peritoneal metastatic disease. 4. Small volume perihepatic and perisplenic ascites. 5. The small bowel is generally decompressed, with full some fluid-filled, nondistended loops throughout. There is transit of oral enteric  contrast to the terminal ileum. No evidence of overt bowel obstruction at this time. Esophagogastric tube is position with tip and side port below the diaphragm. 6. Atelectasis or consolidation of the dependent bilateral lung bases, new compared to prior examination.   Aortic Atherosclerosis (ICD10-I70.0).     11/29/2020 Surgery   EXPLORATORY LAPAROTOMY, PARTIAL BOWEL RESECTION, POSSIBLE OSTOMY CREATION, PERITIONEAL BIOPSY, INSERTION OF GASTROSTOMY TUBE by Dr [name]   11/29/2020 Initial Biopsy   FINAL MICROSCOPIC DIAGNOSIS:   AB. 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    COMMENT:   Immunohistochemistry (IHC) for CK20 and CDX-2 is strong and diffusely  positive.  CK7, TTF-1, Synaptophysin, Chromogranin and CD56 are  negative.  The immunophenotype is compatible with origin from the lower  gastrointestinal tract.  IHC for MMR will be reported separately.  Case  preliminarily discussed with Dr. [name] on 12/02/2020.   At the request of Dr. [name], [phone] was reviewed in retrospect.  Review of the submitted sections confirms the presence of acute  appendicitis.  No malignancy is identified.    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 Wall thickening of the terminal ileum again seen. Fluid-filled distal small bowel, ascending, and transverse colon without obstruction. 2. Equivocal improvement in omental and peritoneal metastatic disease with slightly improved small volume perihepatic and perisplenic ascites. Right lower quadrant mesenteric adenopathy is similar or mildly improved. 3. Sigmoid colonic diverticulosis. Mild mural wall thickening in the sigmoid, no acute diverticulitis. 4. Gastrostomy tube in place, balloon normally positioned in the stomach. 5. Chronic bilateral L5 pars interarticularis defects with trace anterolisthesis of L5 on S1. Chronic avascular necrosis of the left femoral head.   01/16/2021 Genetic Testing   Negative genetic testing. CTNNA1 [id number] VUS, RAD51D p.G140E VUS and TSC1 p.R768H VUS found on the CancerNext-Expanded+RNAinsight.  The CancerNext-Expanded gene panel offered by [facility] and includes sequencing and rearrangement analysis for the following 77 genes: AIP, ALK, APC*, ATM*, AXIN2, BAP1, BARD1, BLM, BMPR1A, BRCA1*, BRCA2*, BRIP1*, CDC73, CDH1*, CDK4, CDKN1B, CDKN2A, CHEK2*, CTNNA1, DICER1, FANCC, FH, FLCN, GALNT12, KIF1B, LZTR1, MAX, MEN1, MET, MLH1*, MSH2*, MSH3, MSH6*, MUTYH*, NBN, NF1*, NF2, NTHL1, PALB2*, PHOX2B, PMS2*, POT1, PRKAR1A, PTCH1, PTEN*, RAD51C*, RAD51D*, RB1, RECQL, RET, SDHA, SDHAF2, SDHB, SDHC, SDHD, SMAD4, SMARCA4, SMARCB1, SMARCE1, STK11, SUFU, TMEM127, TP53*, TSC1, TSC2, VHL and XRCC2 (sequencing and deletion/duplication); EGFR, EGLN1, HOXB13, KIT, MITF, PDGFRA, POLD1, and POLE (sequencing only); EPCAM and GREM1 (deletion/duplication only). DNA and RNA analyses performed for * genes. The report date is Jan 16, 2021.      CURRENT THERAPY:  First line FOLFOX q2 weeks starting 12/18/20 Bevacizumabadded with cycle 2  INTERVAL HISTORY:  Ricardo [name] is here for a follow up of metastatic colon cancer.  He was last seen by me on 01/16/21. He was seen in the infusion area.  He was diagnosed with COVID about 10 days ago, presented with sinus congestion, no cough, dyspnea, fever or other symptoms.  We referred him for COVID treatment and he received monoclonal antibody.  He recovered quickly and completely.  He is clinically doing well, he has been cutting back his oxycodone, from 4 tablets a day to 2 tablets daily, no nausea, vomiting, bowel movement has been normal.  He saw his surgeon yesterday and plan to remove his G-tube.  He remains to be on soft and low residue diet, tolerating very well.  Weight is stable.  All other systems were reviewed with the patient and are negative.  MEDICAL HISTORY:  Past Medical History:  Diagnosis Date  . Arthritis   . Colon cancer ([place])    with metastasis  . Family history of adverse reaction to anesthesia    mother had problem with it due to her asthma  . Family history of breast cancer   . Family history of prostate cancer     SURGICAL HISTORY: Past Surgical History:  Procedure Laterality Date  . BOWEL RESECTION N/A 11/29/2020   Procedure: PARTIAL BOWEL RESECTION;  Surgeon: Jovita Kussmaul, MD;  Location: [facility];  Service: General;  Laterality: N/A;  . GASTROSTOMY Left 11/29/2020   Procedure: INSERTION OF GASTROSTOMY TUBE;  Surgeon: Jovita Kussmaul, MD;  Location: [place];  Service: General;  Laterality: Left;  . IR IMAGING GUIDED PORT INSERTION  12/12/2020  . LAPAROSCOPIC APPENDECTOMY N/A 01/17/2019   Procedure: APPENDECTOMY LAPAROSCOPIC;  Surgeon: Ralene Ok, MD;  Location: [place];  Service: General;  Laterality: N/A;  . LAPAROTOMY N/A 11/29/2020   Procedure: EXPLORATORY LAPAROTOMY;  Surgeon: Autumn Messing  III, MD;  Location: [place];  Service: General;  Laterality: N/A;  PUT CASE IN ROOM 1 STARTING AT 9:30AM FOR 120 MIN  . OSTOMY N/A 11/29/2020   Procedure: POSSIBLE OSTOMY CREATION;  Surgeon: Jovita Kussmaul, MD;  Location: [place];  Service: General;  Laterality:  N/A;  . RECTAL BIOPSY N/A 11/29/2020   Procedure: PERITIONEAL BIOPSY;  Surgeon: Jovita Kussmaul, MD;  Location: [place];  Service: General;  Laterality: N/A;  . SHOULDER SURGERY Left 09/19/2015    I have reviewed the social history and family history with the patient and they are unchanged from previous note.  ALLERGIES:  has No Known Allergies.  MEDICATIONS:  Current Outpatient Medications  Medication Sig Dispense Refill  . oxyCODONE (OXY IR/ROXICODONE) 5 MG immediate release tablet Take 1-2 tablets (5-10 mg total) by mouth every 6 (six) hours as needed for severe pain. 20 tablet 0  . acetaminophen (TYLENOL) 500 MG tablet Take 1,000 mg by mouth every 6 (six) hours as needed for mild pain.    Marland Kitchen dicyclomine (BENTYL) 10 MG capsule Take 1 capsule (10 mg total) by mouth 4 (four) times daily -  before meals and at bedtime. 120 capsule 1  . lidocaine-prilocaine (EMLA) cream Apply to affected area once (Patient taking differently: Apply 1 application topically daily as needed. Apply to affected area once) 30 g 3  . ondansetron (ZOFRAN) 8 MG tablet Take 1 tablet (8 mg total) by mouth 2 (two) times daily as needed for refractory nausea / vomiting. Start on day 3 after chemotherapy. 30 tablet 2  . prochlorperazine (COMPAZINE) 10 MG tablet Take 1 tablet (10 mg total) by mouth every 6 (six) hours as needed for nausea or vomiting. 30 tablet 2  . zolpidem (AMBIEN) 5 MG tablet Take 1 tablet (5 mg total) by mouth at bedtime as needed for sleep. 30 tablet 0   Current Facility-Administered Medications  Medication Dose Route Frequency Provider Last Rate Last Admin  . 0.9 %  sodium chloride infusion   Intravenous PRN Causey, Charlestine Massed, NP       Facility-Administered Medications Ordered in Other Visits  Medication Dose Route Frequency Provider Last Rate Last Admin  . fluorouracil (ADRUCIL) 5,700 mg in sodium chloride 0.9 % 136 mL chemo infusion  2,400 mg/m2 (Treatment Plan Recorded) Intravenous 1 day or 1  dose [name], MD      . heparin lock flush 100 unit/mL  500 Units Intracatheter Once PRN [name], MD      . leucovorin 948 mg in dextrose 5 % 250 mL infusion  400 mg/m2 (Treatment Plan Recorded) Intravenous Once [name], MD      . oxaliplatin (ELOXATIN) 200 mg in dextrose 5 % 500 mL chemo infusion  85 mg/m2 (Treatment Plan Recorded) Intravenous Once [name], MD      . sodium chloride flush (NS) 0.9 % injection 10 mL  10 mL Intracatheter PRN [name], MD        PHYSICAL EXAMINATION: ECOG PERFORMANCE STATUS: 1 - Symptomatic but completely ambulatory Blood pressure 103/76, heart rate 75, temperature 36.8, rate 18, pulse ox 97% on room air GENERAL:alert, no distress and comfortable SKIN: skin color, texture, turgor are normal, no rashes or significant lesions EYES: normal, Conjunctiva are pink and non-injected, sclera clear NECK: supple, thyroid normal size, non-tender, without nodularity LYMPH:  no palpable lymphadenopathy in the cervical, axillary  LUNGS: clear to auscultation and percussion with normal breathing effort HEART: regular rate & rhythm and no  murmurs and no lower extremity edema ABDOMEN:abdomen soft, non-tender and normal bowel sounds, (+) G-tube upper quadrant, clean and no discharge.  Midline surgical incision has healed well. Musculoskeletal:no cyanosis of digits and no clubbing  NEURO: alert & oriented x 3 with fluent speech, no focal motor/sensory deficits  LABORATORY DATA:  I have reviewed the data as listed CBC Latest Ref Rng & Units 01/30/2021 01/16/2021 01/04/2021  WBC 4.0 - 10.5 K/uL 8.4 6.3 6.9  Hemoglobin 13.0 - 17.0 g/dL 13.0 13.9 13.0  Hematocrit 39.0 - 52.0 % 36.9(L) 39.9 39.3  Platelets 150 - 400 K/uL 205 215 278     CMP Latest Ref Rng & Units 01/30/2021 01/16/2021 01/04/2021  Glucose 70 - 99 mg/dL 85 76 101(H)  BUN 6 - 20 mg/dL _0 Creatinine 0.61 - 1.24 mg/dL 0.95 0.99 1.02  Sodium 135 - 145 mmol/L 140 137 140  Potassium 3.5 - 5.1 mmol/L 4.0 4.3  4.1  Chloride 98 - 111 mmol/L 106 105 104  CO2 22 - 32 mmol/L _1 Calcium 8.9 - 10.3 mg/dL 9.0 9.2 8.7(L)  Total Protein 6.5 - 8.1 g/dL 6.9 7.3 6.4(L)  Total Bilirubin 0.3 - 1.2 mg/dL 0.5 0.4 0.2(L)  Alkaline Phos 38 - 126 U/L 93 97 78  AST 15 - 41 U/L _2 ALT 0 - 44 U/L 24 23 39      RADIOGRAPHIC STUDIES: I have personally reviewed the radiological images as listed and agreed with the findings in the report. No results found.   ASSESSMENT & PLAN:  Ricardo [name] is a 55 y.o. male with   1.Adenocarcinomaofterminal iliumand cecum withmetastatic to intra-abdominal lymph nodeand peritoneum, stage IV,MMR normal, KRS/NRS/BRAF wild type -After 8-9 weeks of intermittent but increasing lower abdominal pain, bloating and constipation he was admitted to hospital on 11/27/20. Work up showed mass in theterminal ileumwith extensive omental nodularity and LN involvement. His baseline tumor marker CA 19-9and CEAwerenormal.  -Exlapsurgery with G-tube placementwith Dr [name] on 3/18/22he was found to have diffuse peritoneal metastasis and omental biopsy confirmed poorly differentiatedadenocarcinoma with focal signet ring cellfeaturesand IHC studies support low GI primary.This confirms stage IV metastatic cancer from cecum -He previously consented to first-line palliative chemo with FOLFOX. If he has excellent response to treatment we may refer him for HIPECdebulking surgery in the future. -Began cycle 1 FOLFOX on 12/18/2020, has tolerated well and feels better -FO shows KRAS/NRAS wildtype, but given his right side colon cancer, we do not use EGFR inhibitorin first line. -Bevacizumab added with cycle 2. Experienced worsening abdominal pain, nausea, and vomiting the day after cycle 2, prompting an ED visit. NO recurrent symptoms since he changed back to soft and low residual diet  -CT A/P on 01/03/21 in the ED showed fluid-filled distal small bowel ascending and transverse  colon without evidence for obstruction. -He is clinically doing well, recovered well completely from [place].  Labs reviewed, adequate for treatment, will proceed to cycle 4 FOLFOX second dose bevacizumab today  2. Symptom Management: Constipation, lower abdominal pain, abdominal bloating secondary #1and partial bowl obstruction -For 8-9 weeks before hospitalization he was having increasing lower abdominal pain and constipation -He did haveG-tubeplaced with surgery on 11/29/20. He has not needed to use this since starting chemo. -since starting chemo 12/18/20 he started dicyclomine which helps tremendously.  -He experienced nausea, vomiting, and cramping day two of cycle 2, prompting ED visit. He has been back on a soft diet, which has helped. He  is feeling well again. -I refilled his Zofran and Compazine. He will continue with oxycodone and ambien as needed.  3. Genetic testing  -He has brother with prostate cancer and sister with breast cancer in their 44s.  -Genetics counseling and labs on 12/25/20, results negative.  4. Social Support -He is an avid cyclist. Last long distant riding was 06/2020 across [place]. He has not biked in the 8-9 weeks before cancer diagnosis due to symptoms. His weight heavily fluctuates with changes in his activity level, diet and biking.  -He is married with 1 adult son. His brother Jenny Reichmann is in town. He works as a Designer, television/film set at [employer]. He may take time off work based on how to tolerate chemotherapy.  -He was previouslyoffered [facility] resources including chaplin and SWif needed. He notes he has good family support  5. Covid-19+ -Positive on at-home test on 01/20/21 -Received bebtelovimab on 01/24/21 -has recovered completely    Plan: -Proceed with C4 FOLFOX andsecond dose bevacizumabtoday -will give 1L IVF on day 3 with pump d/c -Labs, chemo, and f/u in 2 weeks -he plans to remove G-tube in near future    No problem-specific Assessment &  Plan notes found for this encounter.   No orders of the defined types were placed in this encounter.  All questions were answered. The patient knows to call the clinic with any problems, questions or concerns. No barriers to learning was detected. The total time spent in the appointment was 30 minutes.     [name], MD 01/30/2021   I, Wilburn Mylar, am acting as scribe for [name], MD.   I have reviewed the above documentation for accuracy and completeness, and I agree with the above.

## 2021-01-30 NOTE — Progress Notes (Signed)
Bevacizumab dose on 01/16/21 not given, safety zone entered.  Ok to proceed today with bevacizumab with no urine protein test.  T.O. Dr Lavonda Jumbo, PharmD

## 2021-01-30 NOTE — Patient Instructions (Signed)
[facility]  Discharge Instructions: Thank you for choosing [facility] to provide your oncology and hematology care.   If you have a lab appointment with the [facility], please go directly to the [facility] and check in at the registration area.   Wear comfortable clothing and clothing appropriate for easy access to any Portacath or PICC line.   We strive to give you quality time with your provider. You may need to reschedule your appointment if you arrive late (15 or more minutes).  Arriving late affects you and other patients whose appointments are after yours.  Also, if you miss three or more appointments without notifying the office, you may be dismissed from the clinic at the provider's discretion.      For prescription refill requests, have your pharmacy contact our office and allow 72 hours for refills to be completed.    Today you received the following chemotherapy and/or immunotherapy agents: avastin, oxaliplatin, leucovorin, fluorourcil      To help prevent nausea and vomiting after your treatment, we encourage you to take your nausea medication as directed.  BELOW ARE SYMPTOMS THAT SHOULD BE REPORTED IMMEDIATELY: . *FEVER GREATER THAN 100.4 F (38 C) OR HIGHER . *CHILLS OR SWEATING . *NAUSEA AND VOMITING THAT IS NOT CONTROLLED WITH YOUR NAUSEA MEDICATION . *UNUSUAL SHORTNESS OF BREATH . *UNUSUAL BRUISING OR BLEEDING . *URINARY PROBLEMS (pain or burning when urinating, or frequent urination) . *BOWEL PROBLEMS (unusual diarrhea, constipation, pain near the anus) . TENDERNESS IN MOUTH AND THROAT WITH OR WITHOUT PRESENCE OF ULCERS (sore throat, sores in mouth, or a toothache) . UNUSUAL RASH, SWELLING OR PAIN  . UNUSUAL VAGINAL DISCHARGE OR ITCHING   Items with * indicate a potential emergency and should be followed up as soon as possible or go to the Emergency Department if any problems should occur.  Please show the  CHEMOTHERAPY ALERT CARD or IMMUNOTHERAPY ALERT CARD at check-in to the Emergency Department and triage nurse.  Should you have questions after your visit or need to cancel or reschedule your appointment, please contact [facility]  Dept: [phone]  and follow the prompts.  Office hours are 8:00 a.m. to 4:30 p.m. Monday - Friday. Please note that voicemails left after 4:00 p.m. may not be returned until the following business day.  We are closed weekends and major holidays. You have access to a nurse at all times for urgent questions. Please call the main number to the clinic Dept: [phone] and follow the prompts.   For any non-urgent questions, you may also contact your provider using MyChart. We now offer e-Visits for anyone 79 and older to request care online for non-urgent symptoms. For details visit [email or web address].   Also download the MyChart app! Go to the app store, search "MyChart", open the app, select [facility], and log in with your MyChart username and password.  Due to Covid, a mask is required upon entering the hospital/clinic. If you do not have a mask, one will be given to you upon arrival. For doctor visits, patients may have 1 support person aged 90 or older with them. For treatment visits, patients cannot have anyone with them due to current Covid guidelines and our immunocompromised population.

## 2021-02-01 ENCOUNTER — Inpatient Hospital Stay: Payer: BC Managed Care – PPO

## 2021-02-01 ENCOUNTER — Other Ambulatory Visit: Payer: Self-pay

## 2021-02-01 VITALS — BP 127/85 | HR 67 | Temp 98.6°F | Resp 18

## 2021-02-01 DIAGNOSIS — C182 Malignant neoplasm of ascending colon: Secondary | ICD-10-CM

## 2021-02-01 DIAGNOSIS — Z95828 Presence of other vascular implants and grafts: Secondary | ICD-10-CM

## 2021-02-01 DIAGNOSIS — C772 Secondary and unspecified malignant neoplasm of intra-abdominal lymph nodes: Secondary | ICD-10-CM

## 2021-02-01 MED ORDER — SODIUM CHLORIDE 0.9% FLUSH
10.0000 mL | INTRAVENOUS | Status: DC | PRN
  Administered 2021-02-01: 10 mL
  Filled 2021-02-01: qty 10

## 2021-02-01 MED ORDER — SODIUM CHLORIDE 0.9 % IV SOLN
INTRAVENOUS | Status: AC
  Filled 2021-02-01 (×2): qty 250

## 2021-02-01 MED ORDER — HEPARIN SOD (PORK) LOCK FLUSH 100 UNIT/ML IV SOLN
500.0000 [IU] | Freq: Once | INTRAVENOUS | Status: AC | PRN
  Administered 2021-02-01: 500 [IU]
  Filled 2021-02-01: qty 5

## 2021-02-01 MED ORDER — SODIUM CHLORIDE 0.9% FLUSH
10.0000 mL | Freq: Once | INTRAVENOUS | Status: AC
  Administered 2021-02-01: 10 mL
  Filled 2021-02-01: qty 10

## 2021-02-03 ENCOUNTER — Telehealth: Payer: Self-pay

## 2021-02-03 NOTE — Telephone Encounter (Signed)
Patient calls reporting had diarrhea all day yesterday and into today.  He is hydrating with drinking lots of water.  Not taking anything for the diarrhea.  I have instructed him to get OTC Imodium and start taking, taking 2 right away and repeat with 1 with each subsequent diarrhea stool.  He verbalized an understanding.  He knows to call back if his diarrhea does not subside for possible IVF.

## 2021-02-04 ENCOUNTER — Other Ambulatory Visit: Payer: Self-pay

## 2021-02-04 ENCOUNTER — Other Ambulatory Visit: Payer: Self-pay | Admitting: Nurse Practitioner

## 2021-02-04 ENCOUNTER — Inpatient Hospital Stay: Payer: BC Managed Care – PPO

## 2021-02-04 VITALS — BP 111/92 | HR 95 | Temp 98.1°F | Resp 20 | Ht 71.0 in | Wt 222.1 lb

## 2021-02-04 DIAGNOSIS — C182 Malignant neoplasm of ascending colon: Secondary | ICD-10-CM | POA: Diagnosis not present

## 2021-02-04 DIAGNOSIS — R197 Diarrhea, unspecified: Secondary | ICD-10-CM

## 2021-02-04 DIAGNOSIS — Z95828 Presence of other vascular implants and grafts: Secondary | ICD-10-CM

## 2021-02-04 DIAGNOSIS — C772 Secondary and unspecified malignant neoplasm of intra-abdominal lymph nodes: Secondary | ICD-10-CM

## 2021-02-04 LAB — CBC (CANCER CENTER ONLY)
HCT: 39.4 % (ref 39.0–52.0)
Hemoglobin: 13.6 g/dL (ref 13.0–17.0)
MCH: 29.2 pg (ref 26.0–34.0)
MCHC: 34.5 g/dL (ref 30.0–36.0)
MCV: 84.7 fL (ref 80.0–100.0)
Platelet Count: 259 10*3/uL (ref 150–400)
RBC: 4.65 MIL/uL (ref 4.22–5.81)
RDW: 15 % (ref 11.5–15.5)
WBC Count: 7.2 10*3/uL (ref 4.0–10.5)
nRBC: 0 % (ref 0.0–0.2)

## 2021-02-04 LAB — DIFFERENTIAL
Abs Immature Granulocytes: 0.02 10*3/uL (ref 0.00–0.07)
Basophils Absolute: 0 10*3/uL (ref 0.0–0.1)
Basophils Relative: 0 %
Eosinophils Absolute: 0.1 10*3/uL (ref 0.0–0.5)
Eosinophils Relative: 1 %
Immature Granulocytes: 0 %
Lymphocytes Relative: 19 %
Lymphs Abs: 1.4 10*3/uL (ref 0.7–4.0)
Monocytes Absolute: 0.5 10*3/uL (ref 0.1–1.0)
Monocytes Relative: 6 %
Neutro Abs: 5.2 10*3/uL (ref 1.7–7.7)
Neutrophils Relative %: 74 %

## 2021-02-04 LAB — CMP (CANCER CENTER ONLY)
ALT: 27 U/L (ref 0–44)
AST: 21 U/L (ref 15–41)
Albumin: 3.8 g/dL (ref 3.5–5.0)
Alkaline Phosphatase: 82 U/L (ref 38–126)
Anion gap: 8 (ref 5–15)
BUN: 15 mg/dL (ref 6–20)
CO2: 23 mmol/L (ref 22–32)
Calcium: 8.5 mg/dL — ABNORMAL LOW (ref 8.9–10.3)
Chloride: 105 mmol/L (ref 98–111)
Creatinine: 0.92 mg/dL (ref 0.61–1.24)
GFR, Estimated: >60 mL/min (ref >60)
Glucose, Bld: 80 mg/dL (ref 70–99)
Potassium: 3.9 mmol/L (ref 3.5–5.1)
Sodium: 136 mmol/L (ref 135–145)
Total Bilirubin: 0.8 mg/dL (ref 0.3–1.2)
Total Protein: 7.4 g/dL (ref 6.5–8.1)

## 2021-02-04 MED ORDER — SODIUM CHLORIDE 0.9% FLUSH
10.0000 mL | Freq: Once | INTRAVENOUS | Status: AC
  Administered 2021-02-04: 10 mL
  Filled 2021-02-04: qty 10

## 2021-02-04 MED ORDER — LOPERAMIDE HCL 2 MG PO TABS
4.0000 mg | ORAL_TABLET | Freq: Once | ORAL | Status: AC
  Administered 2021-02-04: 4 mg via ORAL
  Filled 2021-02-04: qty 2

## 2021-02-04 MED ORDER — SODIUM CHLORIDE 0.9 % IV SOLN
Freq: Once | INTRAVENOUS | Status: DC
  Filled 2021-02-04: qty 250

## 2021-02-04 MED ORDER — LOPERAMIDE HCL 2 MG PO CAPS
ORAL_CAPSULE | ORAL | Status: AC
  Filled 2021-02-04: qty 2

## 2021-02-04 MED ORDER — HEPARIN SOD (PORK) LOCK FLUSH 100 UNIT/ML IV SOLN
500.0000 [IU] | Freq: Once | INTRAVENOUS | Status: AC
  Administered 2021-02-04: 500 [IU]
  Filled 2021-02-04: qty 5

## 2021-02-04 MED ORDER — SODIUM CHLORIDE 0.9 % IV SOLN
Freq: Once | INTRAVENOUS | Status: AC
  Filled 2021-02-04: qty 250

## 2021-02-04 MED ORDER — LOPERAMIDE HCL 2 MG PO TABS
4.0000 mg | ORAL_TABLET | Freq: Once | ORAL | Status: DC
  Filled 2021-02-04: qty 2

## 2021-02-04 MED ORDER — DIPHENOXYLATE-ATROPINE 2.5-0.025 MG PO TABS: 2.0000 | ORAL_TABLET | Freq: Four times a day (QID) | ORAL | 1 refills | Status: DC | PRN

## 2021-02-04 NOTE — Telephone Encounter (Signed)
He is coming in later today for IVF, but will be after I am gone. I placed orders for NS, imodium, and asked nurse to review K rich diet. Also called in lomotil PRN.   Regan Rakers, NP

## 2021-02-04 NOTE — Patient Instructions (Signed)
Dehydration, Adult Dehydration is condition in which there is not enough water or other fluids in the body. This happens when a person loses more fluids than he or she takes in. Important body parts cannot work right without the right amount of fluids. Any loss of fluids from the body can cause dehydration. Dehydration can be mild, worse, or very bad. It should be treated right away to keep it from getting very bad. What are the causes? This condition may be caused by:  Conditions that cause loss of water or other fluids, such as: ? Watery poop (diarrhea). ? Vomiting. ? Sweating a lot. ? Peeing (urinating) a lot.  Not drinking enough fluids, especially when you: ? Are ill. ? Are doing things that take a lot of energy to do.  Other illnesses and conditions, such as fever or infection.  Certain medicines, such as medicines that take extra fluid out of the body (diuretics).  Lack of safe drinking water.  Not being able to get enough water and food. What increases the risk? The following factors may make you more likely to develop this condition:  Having a long-term (chronic) illness that has not been treated the right way, such as: ? Diabetes. ? Heart disease. ? Kidney disease.  Being 65 years of age or older.  Having a disability.  Living in a place that is high above the ground or sea (high in altitude). The thinner, dried air causes more fluid loss.  Doing exercises that put stress on your body for a long time. What are the signs or symptoms? Symptoms of dehydration depend on how bad it is. Mild or worse dehydration  Thirst.  Dry lips or dry mouth.  Feeling dizzy or light-headed, especially when you stand up from sitting.  Muscle cramps.  Your body making: ? Dark pee (urine). Pee may be the color of tea. ? Less pee than normal. ? Less tears than normal.  Headache. Very bad dehydration  Changes in skin. Skin may: ? Be cold to the touch (clammy). ? Be blotchy  or pale. ? Not go back to normal right after you lightly pinch it and let it go.  Little or no tears, pee, or sweat.  Changes in vital signs, such as: ? Fast breathing. ? Low blood pressure. ? Weak pulse. ? Pulse that is more than 100 beats a minute when you are sitting still.  Other changes, such as: ? Feeling very thirsty. ? Eyes that look hollow (sunken). ? Cold hands and feet. ? Being mixed up (confused). ? Being very tired (lethargic) or having trouble waking from sleep. ? Short-term weight loss. ? Loss of consciousness. How is this treated? Treatment for this condition depends on how bad it is. Treatment should start right away. Do not wait until your condition gets very bad. Very bad dehydration is an emergency. You will need to go to a hospital.  Mild or worse dehydration can be treated at home. You may be asked to: ? Drink more fluids. ? Drink an oral rehydration solution (ORS). This drink helps get the right amounts of fluids and salts and minerals in the blood (electrolytes).  Very bad dehydration can be treated: ? With fluids through an IV tube. ? By getting normal levels of salts and minerals in your blood. This is often done by giving salts and minerals through a tube. The tube is passed through your nose and into your stomach. ? By treating the root cause. Follow these instructions at   home: Oral rehydration solution If told by your doctor, drink an ORS:  Make an ORS. Use instructions on the package.  Start by drinking small amounts, about  cup (120 mL) every 5-10 minutes.  Slowly drink more until you have had the amount that your doctor said to have. Eating and drinking  Drink enough clear fluid to keep your pee pale yellow. If you were told to drink an ORS, finish the ORS first. Then, start slowly drinking other clear fluids. Drink fluids such as: ? Water. Do not drink only water. Doing that can make the salt (sodium) level in your body get too low. ? Water  from ice chips you suck on. ? Fruit juice that you have added water to (diluted). ? Low-calorie sports drinks.  Eat foods that have the right amounts of salts and minerals, such as: ? Bananas. ? Oranges. ? Potatoes. ? Tomatoes. ? Spinach.  Do not drink alcohol.  Avoid: ? Drinks that have a lot of sugar. These include:  High-calorie sports drinks.  Fruit juice that you did not add water to.  Soda.  Caffeine. ? Foods that are greasy or have a lot of fat or sugar.         General instructions  Take over-the-counter and prescription medicines only as told by your doctor.  Do not take salt tablets. Doing that can make the salt level in your body get too high.  Return to your normal activities as told by your doctor. Ask your doctor what activities are safe for you.  Keep all follow-up visits as told by your doctor. This is important. Contact a doctor if:  You have pain in your belly (abdomen) and the pain: ? Gets worse. ? Stays in one place.  You have a rash.  You have a stiff neck.  You get angry or annoyed (irritable) more easily than normal.  You are more tired or have a harder time waking than normal.  You feel: ? Weak or dizzy. ? Very thirsty. Get help right away if you have:  Any symptoms of very bad dehydration.  Symptoms of vomiting, such as: ? You cannot eat or drink without vomiting. ? Your vomiting gets worse or does not go away. ? Your vomit has blood or green stuff in it.  Symptoms that get worse with treatment.  A fever.  A very bad headache.  Problems with peeing or pooping (having a bowel movement), such as: ? Watery poop that gets worse or does not go away. ? Blood in your poop (stool). This may cause poop to look black and tarry. ? Not peeing in 6-8 hours. ? Peeing only a small amount of very dark pee in 6-8 hours.  Trouble breathing. These symptoms may be an emergency. Do not wait to see if the symptoms will go away. Get  medical help right away. Call your local emergency services (911 in the [place] Do not drive yourself to the hospital. Summary  Dehydration is a condition in which there is not enough water or other fluids in the body. This happens when a person loses more fluids than he or she takes in.  Treatment for this condition depends on how bad it is. Treatment should be started right away. Do not wait until your condition gets very bad.  Drink enough clear fluid to keep your pee pale yellow. If you were told to drink an oral rehydration solution (ORS), finish the ORS first. Then, start slowly drinking other clear fluids.    Take over-the-counter and prescription medicines only as told by your doctor.  Get help right away if you have any symptoms of very bad dehydration. This information is not intended to replace advice given to you by your health care provider. Make sure you discuss any questions you have with your health care provider. Document Revised: 04/13/2019 Document Reviewed: 04/13/2019 [employer] Patient Education  2021 [employer]  

## 2021-02-04 NOTE — Progress Notes (Signed)
Patient calls stating his diarrhea has continued despite using Imodium, drinking some fluids but feels dehydrated.  He is requesting IVF.  Called patient back and have arranged for IVF this afternoon at 2 pm.  He has agreed to this time.

## 2021-02-04 NOTE — Telephone Encounter (Signed)
I agree. I assume he does not have fever or chills. Please follow up tomorrow morning to see if he needs to be seen with IVF if he has persistent diarrhea. Thanks   [name] MD

## 2021-02-07 ENCOUNTER — Other Ambulatory Visit: Payer: Self-pay | Admitting: Hematology

## 2021-02-07 ENCOUNTER — Telehealth: Payer: Self-pay

## 2021-02-07 MED ORDER — OXYCODONE HCL 5 MG PO TABS: 5.0000 mg | ORAL_TABLET | Freq: Four times a day (QID) | ORAL | 0 refills | Status: DC | PRN

## 2021-02-07 NOTE — Telephone Encounter (Signed)
Patient calls for refill on Oxycodone 5 mg tablets to be sent into [facility] on file.

## 2021-02-12 NOTE — Progress Notes (Signed)
[facility]   Telephone:([phone]) [phone] Fax:(336) [phone]   Clinic Follow up Note   Patient Care Team: [name], MD as PCP - General (Internal Medicine) [name], MD as Consulting Physician (Oncology) [name], RN as Oncology Nurse Navigator  Date of Service:  02/13/2021  CHIEF COMPLAINT: f/u of metastatic colon cancer  SUMMARY OF ONCOLOGIC HISTORY: Oncology History Overview Note  Cancer Staging No matching staging information was found for the patient.    metastatic cecal cancer  11/27/2020 Imaging   CT Angio CAP  IMPRESSION: 1. Thickening of the terminal ileum with associated proximal mid to distal small bowel obstruction. Findings could be due to an ileitis versus malignancy. Nonspecific mesenteric edema could be due to engorgement versus metastases. No associated bowel perforation. Recommend endoscopy for further evaluation. 2. Indeterminate right lower quadrant lymphadenopathy. 3. Scattered colonic diverticulosis with no acute diverticulitis. 4. Stable right hepatic lobe subcentimeter hyperdensity likely represents a hepatic hemangioma. 5. No acute vascular abnormality. Aortic Atherosclerosis (ICD10-I70.0) - mild. 6. No acute intrathoracic abnormality.   11/28/2020 Imaging   CT AP  IMPRESSION: 1. There is masslike, circumferential thickening of the terminal ileum and cecal base near the ileocecal valve and abnormally enlarged lymph nodes in the right lower quadrant mesentery adjacent to the terminal ileum measuring up to 2.3 x 1.4 cm. 2. There is extensive omental and peritoneal nodularity and caking throughout the abdomen. 3. Findings are highly concerning for primary colon malignancy with nodal and peritoneal metastatic disease. 4. Small volume perihepatic and perisplenic ascites. 5. The small bowel is generally decompressed, with full some fluid-filled, nondistended loops throughout. There is transit of oral enteric  contrast to the terminal ileum. No evidence of overt bowel obstruction at this time. Esophagogastric tube is position with tip and side port below the diaphragm. 6. Atelectasis or consolidation of the dependent bilateral lung bases, new compared to prior examination.   Aortic Atherosclerosis (ICD10-I70.0).     11/29/2020 Surgery   EXPLORATORY LAPAROTOMY, PARTIAL BOWEL RESECTION, POSSIBLE OSTOMY CREATION, PERITIONEAL BIOPSY, INSERTION OF GASTROSTOMY TUBE by Dr [name]   11/29/2020 Initial Biopsy   FINAL MICROSCOPIC DIAGNOSIS:   AB. 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    COMMENT:   Immunohistochemistry (IHC) for CK20 and CDX-2 is strong and diffusely  positive.  CK7, TTF-1, Synaptophysin, Chromogranin and CD56 are  negative.  The immunophenotype is compatible with origin from the lower  gastrointestinal tract.  IHC for MMR will be reported separately.  Case  preliminarily discussed with Dr. [name] on 12/02/2020.   At the request of Dr. [name], [phone] was reviewed in retrospect.  Review of the submitted sections confirms the presence of acute  appendicitis.  No malignancy is identified.    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 Wall thickening of the terminal ileum again seen. Fluid-filled distal small bowel, ascending, and transverse colon without obstruction. 2. Equivocal improvement in omental and peritoneal metastatic disease with slightly improved small volume perihepatic and perisplenic ascites. Right lower quadrant mesenteric adenopathy is similar or mildly improved. 3. Sigmoid colonic diverticulosis. Mild mural wall thickening in the sigmoid, no acute diverticulitis. 4. Gastrostomy tube in place, balloon normally positioned in the stomach. 5. Chronic bilateral L5 pars interarticularis defects with trace anterolisthesis of L5 on S1. Chronic avascular necrosis of the left femoral head.   01/16/2021 Genetic Testing   Negative genetic testing. CTNNA1 [id number] VUS, RAD51D p.G140E VUS and TSC1 p.R768H VUS found on the CancerNext-Expanded+RNAinsight.  The CancerNext-Expanded gene panel offered by [facility] and includes sequencing and rearrangement analysis for the following 77 genes: AIP, ALK, APC*, ATM*, AXIN2, BAP1, BARD1, BLM, BMPR1A, BRCA1*, BRCA2*, BRIP1*, CDC73, CDH1*, CDK4, CDKN1B, CDKN2A, CHEK2*, CTNNA1, DICER1, FANCC, FH, FLCN, GALNT12, KIF1B, LZTR1, MAX, MEN1, MET, MLH1*, MSH2*, MSH3, MSH6*, MUTYH*, NBN, NF1*, NF2, NTHL1, PALB2*, PHOX2B, PMS2*, POT1, PRKAR1A, PTCH1, PTEN*, RAD51C*, RAD51D*, RB1, RECQL, RET, SDHA, SDHAF2, SDHB, SDHC, SDHD, SMAD4, SMARCA4, SMARCB1, SMARCE1, STK11, SUFU, TMEM127, TP53*, TSC1, TSC2, VHL and XRCC2 (sequencing and deletion/duplication); EGFR, EGLN1, HOXB13, KIT, MITF, PDGFRA, POLD1, and POLE (sequencing only); EPCAM and GREM1 (deletion/duplication only). DNA and RNA analyses performed for * genes. The report date is Jan 16, 2021.      CURRENT THERAPY:  First line FOLFOX q2 weeks starting 12/18/20 Bevacizumabadded with cycle 2  INTERVAL HISTORY:  Ricardo [name] is here for a follow up of metastatic colon cancer.  He was last seen by me on 01/30/21. He also returned for IVF last week (02/04/21) due to diarrhea. He presents to the clinic alone. He notes he is continuing to eat small, soft meals. He notes his first big meal was on Memorial Day, and he was able to eat ice cream over the weekend. He reports his bowel movements are now regular. He notes some very rare nausea that only lasts about 15 minutes at a time. He denies any discharge since his tube removal. He notes he feels better without the tube in. 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  All other systems were reviewed with the patient and are negative.  MEDICAL HISTORY:  Past Medical History:  Diagnosis Date  . Arthritis   . Colon cancer ([place])    with metastasis  . Family history of adverse reaction to anesthesia    mother had problem with it due to her asthma  . Family history of breast cancer   . Family history of prostate cancer     SURGICAL HISTORY: Past Surgical History:  Procedure Laterality Date  . BOWEL RESECTION N/A 11/29/2020   Procedure: PARTIAL BOWEL RESECTION;  Surgeon: Jovita Kussmaul, MD;  Location: [facility];  Service: General;  Laterality: N/A;  . GASTROSTOMY Left 11/29/2020   Procedure: INSERTION OF GASTROSTOMY TUBE;  Surgeon: Jovita Kussmaul, MD;  Location: [facility];  Service: General;  Laterality: Left;  . IR IMAGING GUIDED PORT INSERTION  12/12/2020  . LAPAROSCOPIC APPENDECTOMY N/A 01/17/2019   Procedure: APPENDECTOMY LAPAROSCOPIC;  Surgeon: Ralene Ok, MD;  Location: [facility];  Service: General;  Laterality: N/A;  . LAPAROTOMY N/A 11/29/2020   Procedure: EXPLORATORY LAPAROTOMY;  Surgeon: Autumn Messing III,  MD;  Location: [facility] [place];  Service: General;  Laterality: N/A;  PUT CASE IN ROOM 1 STARTING AT 9:30AM FOR 120 MIN  . OSTOMY N/A 11/29/2020   Procedure: POSSIBLE OSTOMY CREATION;  Surgeon: Jovita Kussmaul, MD;  Location: [facility];  Service: General;  Laterality: N/A;  . RECTAL BIOPSY N/A  11/29/2020   Procedure: PERITIONEAL BIOPSY;  Surgeon: Jovita Kussmaul, MD;  Location: [facility];  Service: General;  Laterality: N/A;  . SHOULDER SURGERY Left 09/19/2015    I have reviewed the social history and family history with the patient and they are unchanged from previous note.  ALLERGIES:  has No Known Allergies.  MEDICATIONS:  Current Outpatient Medications  Medication Sig Dispense Refill  . acetaminophen (TYLENOL) 500 MG tablet Take 1,000 mg by mouth every 6 (six) hours as needed for mild pain.    Marland Kitchen dicyclomine (BENTYL) 10 MG capsule Take 1 capsule (10 mg total) by mouth 4 (four) times daily -  before meals and at bedtime. 120 capsule 1  . diphenoxylate-atropine (LOMOTIL) 2.5-0.025 MG tablet Take 2 tablets by mouth 4 (four) times daily as needed for diarrhea or loose stools. 30 tablet 1  . lidocaine-prilocaine (EMLA) cream Apply to affected area once (Patient taking differently: Apply 1 application topically daily as needed. Apply to affected area once) 30 g 3  . ondansetron (ZOFRAN) 8 MG tablet Take 1 tablet (8 mg total) by mouth 2 (two) times daily as needed for refractory nausea / vomiting. Start on day 3 after chemotherapy. 30 tablet 2  . oxyCODONE (OXY IR/ROXICODONE) 5 MG immediate release tablet Take 1 tablet (5 mg total) by mouth every 6 (six) hours as needed for severe pain. 60 tablet 0  . prochlorperazine (COMPAZINE) 10 MG tablet Take 1 tablet (10 mg total) by mouth every 6 (six) hours as needed for nausea or vomiting. 30 tablet 2  . zolpidem (AMBIEN) 5 MG tablet Take 1 tablet (5 mg total) by mouth at bedtime as needed for sleep. 30 tablet 0   Current Facility-Administered Medications  Medication Dose Route Frequency Provider Last Rate Last Admin  . 0.9 %  sodium chloride infusion   Intravenous PRN Causey, Charlestine Massed, NP       Facility-Administered Medications Ordered in Other Visits  Medication Dose Route Frequency Provider Last Rate Last Admin  . bevacizumab-bvzr  (ZIRABEV) 500 mg in sodium chloride 0.9 % 100 mL chemo infusion  5 mg/kg (Treatment Plan Recorded) Intravenous Once [name], MD 720 mL/hr at 02/13/21 0957 500 mg at 02/13/21 0957  . fluorouracil (ADRUCIL) 5,700 mg in sodium chloride 0.9 % 136 mL chemo infusion  2,400 mg/m2 (Treatment Plan Recorded) Intravenous 1 day or 1 dose [name], MD      . leucovorin 948 mg in dextrose 5 % 250 mL infusion  400 mg/m2 (Treatment Plan Recorded) Intravenous Once [name], MD      . oxaliplatin (ELOXATIN) 200 mg in dextrose 5 % 500 mL chemo infusion  85 mg/m2 (Treatment Plan Recorded) Intravenous Once [name], MD        PHYSICAL EXAMINATION: ECOG PERFORMANCE STATUS: 1 - Symptomatic but completely ambulatory  Vitals:   02/13/21 0817  BP: 123/79  Pulse: 68  Resp: 18  Temp: 97.9 F (36.6 C)  SpO2: 99%   Filed Weights   02/13/21 0817  Weight: 231 lb 3.2 oz (104.9 kg)    GENERAL:alert, no distress and comfortable SKIN: skin color, texture, turgor are normal, no rashes or significant  lesions EYES: normal, Conjunctiva are pink and non-injected, sclera clear  NECK: supple, thyroid normal size, non-tender, without nodularity LYMPH:  no palpable lymphadenopathy in the cervical, axillary  LUNGS: clear to auscultation and percussion with normal breathing effort HEART: regular rate & rhythm and no murmurs and no lower extremity edema ABDOMEN:abdomen soft, non-tender and normal bowel sounds Musculoskeletal:no cyanosis of digits and no clubbing  NEURO: alert & oriented x 3 with fluent speech, no focal motor/sensory deficits  LABORATORY DATA:  I have reviewed the data as listed CBC Latest Ref Rng & Units 02/13/2021 02/04/2021 01/30/2021  WBC 4.0 - 10.5 K/uL 4.3 7.2 8.4  Hemoglobin 13.0 - 17.0 g/dL 13.0 13.6 13.0  Hematocrit 39.0 - 52.0 % 36.6(L) 39.4 36.9(L)  Platelets 150 - 400 K/uL 153 259 205     CMP Latest Ref Rng & Units 02/13/2021 02/04/2021 01/30/2021  Glucose 70 - 99 mg/dL 90 80 85  BUN 6 - 20  mg/dL '11 15 11  ' Creatinine 0.61 - 1.24 mg/dL 1.11 0.92 0.95  Sodium 135 - 145 mmol/L 138 136 140  Potassium 3.5 - 5.1 mmol/L 4.0 3.9 4.0  Chloride 98 - 111 mmol/L 106 105 106  CO2 22 - 32 mmol/L '22 23 25  ' Calcium 8.9 - 10.3 mg/dL 8.9 8.5(L) 9.0  Total Protein 6.5 - 8.1 g/dL 7.1 7.4 6.9  Total Bilirubin 0.3 - 1.2 mg/dL 0.5 0.8 0.5  Alkaline Phos 38 - 126 U/L 96 82 93  AST 15 - 41 U/L '16 21 19  ' ALT 0 - 44 U/L '22 27 24      ' RADIOGRAPHIC STUDIES: I have personally reviewed the radiological images as listed and agreed with the findings in the report. No results found.   ASSESSMENT & PLAN:  Ricardo [name] is a 55 y.o. male with   1.Adenocarcinomaofterminal iliumand cecum withmetastatic to intra-abdominal lymph nodeand peritoneum, stage IV,MMR normal, KRS/NRS/BRAF wild type -After 8-9 weeks of intermittent but increasing lower abdominal pain, bloating and constipation he was admitted to hospital on 11/27/20. Work up showed mass in theterminal ileumwith extensive omental nodularity and LN involvement. His baseline tumor marker CA 19-9and CEAwerenormal.  -Exlapsurgery with G-tube placementwith Dr [name] on 3/18/22he was found to have diffuse peritoneal metastasis and omental biopsy confirmed poorly differentiatedadenocarcinoma with focal signet ring cellfeaturesand IHC studies support low GI primary.This confirms stage IV metastatic cancer from cecum -He previously consented to first-line palliative chemo with FOLFOX. If he has excellent response to treatment we may refer him for HIPECdebulking surgery in the future. -Began cycle 1 FOLFOX on 12/18/2020, has tolerated well and feels better -FO shows KRAS/NRAS wildtype, but given his right side colon cancer, we do not use EGFR inhibitorin first line. -Bevacizumabadded with cycle 2. Experienced worsening abdominal pain, nausea, and vomiting the day after cycle 2, prompting an ED visit. NO recurrent symptoms since he changed  back to soft and low residual diet  -CT A/P on 01/03/21 in the ED showedfluid-filled distal small bowel ascending and transverse colon without evidence for obstruction. -G-tube removed late 01/2021. -I discussed his next scan, which we can do after this cycle or after the next. He would prefer after this cycle. I did inform him about the national contrast shortage of iv contrast. -I will refer him to Dr. Clovis Riley after his next cycle (C6) to discuss surgery if his restaging CT shows good response. And I may consider adding a third agent to do FOLFOXFIRI if needed to get him to surgery -He is  clinically doing well. Labs reviewed, adequate for treatment, will proceed to cycle 5 FOLFOX and third dose bevacizumab today  2. Symptom Management: Constipation, lower abdominal pain, abdominal bloating secondary #1and partial bowl obstruction -For 8-9 weeks before hospitalization he was having increasing lower abdominal pain and constipation -He did haveG-tubeplaced with surgery on 11/29/20. Hehas not needed to use this since starting chemo. -since starting chemo 12/18/20 he started dicyclomine which helps tremendously. -He experienced nausea, vomiting, and cramping day two of cycle 2, prompting ED visit. He has been back on a soft diet, which has helped. He is feeling well again. -He experienced a bout of diarrhea following cycle 3. He was given IVF and has recovered well. -He notes he would like to receive IVF prior to treatment in the future to see if this helps.  3. Genetic testing  -He has brother with prostate cancer and sister with breast cancer in their 76s.  -Genetics counseling and labs on 12/25/20, results negative.  4. Social Support -He is an avid cyclist. Last long distant riding was 06/2020 across [place]. He has not biked in the 8-9 weeks before cancer diagnosis due to symptoms. His weight heavily fluctuates with changes in his activity level, diet and biking.  -He is married with 1 adult  son. His brother Jenny Reichmann is in town. He works as a Designer, television/film set at [employer]. He may take time off work based on how to tolerate chemotherapy.  -He was previouslyoffered [facility] resources including chaplin and SWif needed. He notes he has good family support  5. Covid-19+ -Positive on at-home test on 01/20/21 -Received bebtelovimab on 01/24/21 -has recovered completely    Plan: -Proceed with C5FOLFOX andthird dose bevacizumabtoday -CT A/P scan ordered to be performed in next 2 weeks before next ov -will give 1L IVF on day 2 and again with pump d/c -Labs,chemo, and f/u in 2 weeks   No problem-specific Assessment & Plan notes found for this encounter.   Orders Placed This Encounter  Procedures  . CT ABDOMEN PELVIS WO CONTRAST    Standing Status:   Future    Standing Expiration Date:   02/13/2022    Order Specific Question:   Preferred imaging location?    Answer:   [facility]    Order Specific Question:   Radiology Contrast Protocol - do NOT remove file path    Answer:   [email or web address]   All questions were answered. The patient knows to call the clinic with any problems, questions or concerns. No barriers to learning was detected. The total time spent in the appointment was 30 minutes.     [name], MD 02/13/2021   I, Wilburn Mylar, am acting as scribe for [name], MD.   I have reviewed the above documentation for accuracy and completeness, and I agree with the above.

## 2021-02-13 ENCOUNTER — Telehealth: Payer: Self-pay | Admitting: Hematology

## 2021-02-13 ENCOUNTER — Encounter: Payer: Self-pay | Admitting: Hematology

## 2021-02-13 ENCOUNTER — Other Ambulatory Visit: Payer: Self-pay

## 2021-02-13 ENCOUNTER — Inpatient Hospital Stay: Payer: BC Managed Care – PPO | Attending: Nurse Practitioner

## 2021-02-13 ENCOUNTER — Inpatient Hospital Stay: Payer: BC Managed Care – PPO | Admitting: Hematology

## 2021-02-13 ENCOUNTER — Inpatient Hospital Stay: Payer: BC Managed Care – PPO

## 2021-02-13 VITALS — BP 123/79 | HR 68 | Temp 97.9°F | Resp 18 | Ht 71.0 in | Wt 231.2 lb

## 2021-02-13 VITALS — BP 103/71

## 2021-02-13 DIAGNOSIS — C772 Secondary and unspecified malignant neoplasm of intra-abdominal lymph nodes: Secondary | ICD-10-CM

## 2021-02-13 DIAGNOSIS — C786 Secondary malignant neoplasm of retroperitoneum and peritoneum: Secondary | ICD-10-CM | POA: Diagnosis not present

## 2021-02-13 DIAGNOSIS — Z95828 Presence of other vascular implants and grafts: Secondary | ICD-10-CM

## 2021-02-13 DIAGNOSIS — Z79899 Other long term (current) drug therapy: Secondary | ICD-10-CM | POA: Diagnosis not present

## 2021-02-13 DIAGNOSIS — C182 Malignant neoplasm of ascending colon: Secondary | ICD-10-CM | POA: Insufficient documentation

## 2021-02-13 DIAGNOSIS — Z5111 Encounter for antineoplastic chemotherapy: Secondary | ICD-10-CM | POA: Diagnosis present

## 2021-02-13 LAB — CBC WITH DIFFERENTIAL (CANCER CENTER ONLY)
Abs Immature Granulocytes: 0.01 10*3/uL (ref 0.00–0.07)
Basophils Absolute: 0 10*3/uL (ref 0.0–0.1)
Basophils Relative: 0 %
Eosinophils Absolute: 0.1 10*3/uL (ref 0.0–0.5)
Eosinophils Relative: 2 %
HCT: 36.6 % — ABNORMAL LOW (ref 39.0–52.0)
Hemoglobin: 13 g/dL (ref 13.0–17.0)
Immature Granulocytes: 0 %
Lymphocytes Relative: 24 %
Lymphs Abs: 1 10*3/uL (ref 0.7–4.0)
MCH: 29.3 pg (ref 26.0–34.0)
MCHC: 35.5 g/dL (ref 30.0–36.0)
MCV: 82.4 fL (ref 80.0–100.0)
Monocytes Absolute: 0.7 10*3/uL (ref 0.1–1.0)
Monocytes Relative: 17 %
Neutro Abs: 2.4 10*3/uL (ref 1.7–7.7)
Neutrophils Relative %: 57 %
Platelet Count: 153 10*3/uL (ref 150–400)
RBC: 4.44 MIL/uL (ref 4.22–5.81)
RDW: 15.9 % — ABNORMAL HIGH (ref 11.5–15.5)
WBC Count: 4.3 10*3/uL (ref 4.0–10.5)
nRBC: 0 % (ref 0.0–0.2)

## 2021-02-13 LAB — CMP (CANCER CENTER ONLY)
ALT: 22 U/L (ref 0–44)
AST: 16 U/L (ref 15–41)
Albumin: 3.6 g/dL (ref 3.5–5.0)
Alkaline Phosphatase: 96 U/L (ref 38–126)
Anion gap: 10 (ref 5–15)
BUN: 11 mg/dL (ref 6–20)
CO2: 22 mmol/L (ref 22–32)
Calcium: 8.9 mg/dL (ref 8.9–10.3)
Chloride: 106 mmol/L (ref 98–111)
Creatinine: 1.11 mg/dL (ref 0.61–1.24)
GFR, Estimated: >60 mL/min (ref >60)
Glucose, Bld: 90 mg/dL (ref 70–99)
Potassium: 4 mmol/L (ref 3.5–5.1)
Sodium: 138 mmol/L (ref 135–145)
Total Bilirubin: 0.5 mg/dL (ref 0.3–1.2)
Total Protein: 7.1 g/dL (ref 6.5–8.1)

## 2021-02-13 LAB — TOTAL PROTEIN, URINE DIPSTICK: Protein, ur: NEGATIVE mg/dL — AB

## 2021-02-13 MED ORDER — SODIUM CHLORIDE 0.9 % IV SOLN
Freq: Once | INTRAVENOUS | Status: AC
  Filled 2021-02-13: qty 250

## 2021-02-13 MED ORDER — SODIUM CHLORIDE 0.9% FLUSH
10.0000 mL | Freq: Once | INTRAVENOUS | Status: AC
  Administered 2021-02-13: 10 mL
  Filled 2021-02-13: qty 10

## 2021-02-13 MED ORDER — DEXTROSE 5 % IV SOLN
Freq: Once | INTRAVENOUS | Status: AC
  Filled 2021-02-13: qty 250

## 2021-02-13 MED ORDER — SODIUM CHLORIDE 0.9 % IV SOLN
10.0000 mg | Freq: Once | INTRAVENOUS | Status: AC
  Administered 2021-02-13: 10 mg via INTRAVENOUS
  Filled 2021-02-13: qty 10

## 2021-02-13 MED ORDER — PALONOSETRON HCL INJECTION 0.25 MG/5ML
INTRAVENOUS | Status: AC
  Filled 2021-02-13: qty 5

## 2021-02-13 MED ORDER — PALONOSETRON HCL INJECTION 0.25 MG/5ML
0.2500 mg | Freq: Once | INTRAVENOUS | Status: AC
  Administered 2021-02-13: 0.25 mg via INTRAVENOUS

## 2021-02-13 MED ORDER — DEXTROSE 5 % IV SOLN
400.0000 mg/m2 | Freq: Once | INTRAVENOUS | Status: AC
  Administered 2021-02-13: 948 mg via INTRAVENOUS
  Filled 2021-02-13: qty 47.4

## 2021-02-13 MED ORDER — SODIUM CHLORIDE 0.9 % IV SOLN
5.0000 mg/kg | Freq: Once | INTRAVENOUS | Status: AC
  Administered 2021-02-13: 500 mg via INTRAVENOUS
  Filled 2021-02-13: qty 16

## 2021-02-13 MED ORDER — OXALIPLATIN CHEMO INJECTION 100 MG/20ML
85.0000 mg/m2 | Freq: Once | INTRAVENOUS | Status: AC
  Administered 2021-02-13: 200 mg via INTRAVENOUS
  Filled 2021-02-13: qty 40

## 2021-02-13 MED ORDER — SODIUM CHLORIDE 0.9 % IV SOLN
2400.0000 mg/m2 | INTRAVENOUS | Status: DC
  Administered 2021-02-13: 5700 mg via INTRAVENOUS
  Filled 2021-02-13: qty 114

## 2021-02-13 NOTE — Telephone Encounter (Signed)
Appt scheduled for tomorrow per secure chat with MD. Ricardo [name] pt, no answer. Left msg with appt date and time.

## 2021-02-13 NOTE — Patient Instructions (Signed)
[facility] ONCOLOGY  Discharge Instructions: Thank you for choosing [facility] to provide your oncology and hematology care.   If you have a lab appointment with the [facility], please go directly to the [facility] and check in at the registration area.   Wear comfortable clothing and clothing appropriate for easy access to any Portacath or PICC line.   We strive to give you quality time with your provider. You may need to reschedule your appointment if you arrive late (15 or more minutes).  Arriving late affects you and other patients whose appointments are after yours.  Also, if you miss three or more appointments without notifying the office, you may be dismissed from the clinic at the provider's discretion.      For prescription refill requests, have your pharmacy contact our office and allow 72 hours for refills to be completed.    Today you received the following chemotherapy and/or immunotherapy agents Bevacizumab, Oxaliplatin, Leucovorin and 5FU      To help prevent nausea and vomiting after your treatment, we encourage you to take your nausea medication as directed.  BELOW ARE SYMPTOMS THAT SHOULD BE REPORTED IMMEDIATELY: . *FEVER GREATER THAN 100.4 F (38 C) OR HIGHER . *CHILLS OR SWEATING . *NAUSEA AND VOMITING THAT IS NOT CONTROLLED WITH YOUR NAUSEA MEDICATION . *UNUSUAL SHORTNESS OF BREATH . *UNUSUAL BRUISING OR BLEEDING . *URINARY PROBLEMS (pain or burning when urinating, or frequent urination) . *BOWEL PROBLEMS (unusual diarrhea, constipation, pain near the anus) . TENDERNESS IN MOUTH AND THROAT WITH OR WITHOUT PRESENCE OF ULCERS (sore throat, sores in mouth, or a toothache) . UNUSUAL RASH, SWELLING OR PAIN  . UNUSUAL VAGINAL DISCHARGE OR ITCHING   Items with * indicate a potential emergency and should be followed up as soon as possible or go to the Emergency Department if any problems should occur.  Please show the CHEMOTHERAPY  ALERT CARD or IMMUNOTHERAPY ALERT CARD at check-in to the Emergency Department and triage nurse.  Should you have questions after your visit or need to cancel or reschedule your appointment, please contact [facility]  Dept: [phone]  and follow the prompts.  Office hours are 8:00 a.m. to 4:30 p.m. Monday - Friday. Please note that voicemails left after 4:00 p.m. may not be returned until the following business day.  We are closed weekends and major holidays. You have access to a nurse at all times for urgent questions. Please call the main number to the clinic Dept: [phone] and follow the prompts.   For any non-urgent questions, you may also contact your provider using MyChart. We now offer e-Visits for anyone 56 and older to request care online for non-urgent symptoms. For details visit [email or web address].   Also download the MyChart app! Go to the app store, search "MyChart", open the app, select [facility], and log in with your MyChart username and password.  Due to Covid, a mask is required upon entering the hospital/clinic. If you do not have a mask, one will be given to you upon arrival. For doctor visits, patients may have 1 support person aged 26 or older with them. For treatment visits, patients cannot have anyone with them due to current Covid guidelines and our immunocompromised population.

## 2021-02-14 ENCOUNTER — Inpatient Hospital Stay: Payer: BC Managed Care – PPO

## 2021-02-14 ENCOUNTER — Telehealth: Payer: Self-pay

## 2021-02-14 ENCOUNTER — Other Ambulatory Visit: Payer: Self-pay

## 2021-02-14 VITALS — BP 126/81 | HR 75 | Temp 98.3°F | Resp 18

## 2021-02-14 DIAGNOSIS — C182 Malignant neoplasm of ascending colon: Secondary | ICD-10-CM

## 2021-02-14 DIAGNOSIS — C772 Secondary and unspecified malignant neoplasm of intra-abdominal lymph nodes: Secondary | ICD-10-CM

## 2021-02-14 DIAGNOSIS — Z95828 Presence of other vascular implants and grafts: Secondary | ICD-10-CM

## 2021-02-14 MED ORDER — SODIUM CHLORIDE 0.9 % IV SOLN
Freq: Once | INTRAVENOUS | Status: AC
  Filled 2021-02-14: qty 250

## 2021-02-14 NOTE — Patient Instructions (Signed)
Rehydration, Adult Rehydration is the replacement of body fluids, salts, and minerals (electrolytes) that are lost during dehydration. Dehydration is when there is not enough water or other fluids in the body. This happens when you lose more fluids than you take in. Common causes of dehydration include:  Not drinking enough fluids. This can occur when you are ill or doing activities that require a lot of energy, especially in hot weather.  Conditions that cause loss of water or other fluids, such as diarrhea, vomiting, sweating, or urinating a lot.  Other illnesses, such as fever or infection.  Certain medicines, such as those that remove excess fluid from the body (diuretics). Symptoms of mild or moderate dehydration may include thirst, dry lips and mouth, and dizziness. Symptoms of severe dehydration may include increased heart rate, confusion, fainting, and not urinating. For severe dehydration, you may need to get fluids through an IV at the hospital. For mild or moderate dehydration, you can usually rehydrate at home by drinking certain fluids as told by your health care provider. What are the risks? Generally, rehydration is safe. However, taking in too much fluid (overhydration) can be a problem. This is rare. Overhydration can cause an electrolyte imbalance, kidney failure, or a decrease in salt (sodium) levels in the body. Supplies needed You will need an oral rehydration solution (ORS) if your health care provider tells you to use one. This is a drink to treat dehydration. It can be found in pharmacies and retail stores. How to rehydrate Fluids Follow instructions from your health care provider for rehydration. The kind of fluid and the amount you should drink depend on your condition. In general, you should choose drinks that you prefer.  If told by your health care provider, drink an ORS. ? Make an ORS by following instructions on the package. ? Start by drinking small amounts,  about  cup (120 mL) every 5-10 minutes. ? Slowly increase how much you drink until you have taken the amount recommended by your health care provider.  Drink enough clear fluids to keep your urine pale yellow. If you were told to drink an ORS, finish it first, then start slowly drinking other clear fluids. Drink fluids such as: ? Water. This includes sparkling water and flavored water. Drinking only water can lead to having too little sodium in your body (hyponatremia). Follow the advice of your health care provider. ? Water from ice chips you suck on. ? Fruit juice with water you add to it (diluted). ? Sports drinks. ? Hot or cold herbal teas. ? Broth-based soups. ? Milk or milk products. Food Follow instructions from your health care provider about what to eat while you rehydrate. Your health care provider may recommend that you slowly begin eating regular foods in small amounts.  Eat foods that contain a healthy balance of electrolytes, such as bananas, oranges, potatoes, tomatoes, and spinach.  Avoid foods that are greasy or contain a lot of sugar. In some cases, you may get nutrition through a feeding tube that is passed through your nose and into your stomach (nasogastric tube, or NG tube). This may be done if you have uncontrolled vomiting or diarrhea.   Beverages to avoid Certain beverages may make dehydration worse. While you rehydrate, avoid drinking alcohol.   How to tell if you are recovering from dehydration You may be recovering from dehydration if:  You are urinating more often than before you started rehydrating.  Your urine is pale yellow.  Your energy level   improves.  You vomit less frequently.  You have diarrhea less frequently.  Your appetite improves or returns to normal.  You feel less dizzy or less light-headed.  Your skin tone and color start to look more normal. Follow these instructions at home:  Take over-the-counter and prescription medicines only  as told by your health care provider.  Do not take sodium tablets. Doing this can lead to having too much sodium in your body (hypernatremia). Contact a health care provider if:  You continue to have symptoms of mild or moderate dehydration, such as: ? Thirst. ? Dry lips. ? Slightly dry mouth. ? Dizziness. ? Dark urine or less urine than normal. ? Muscle cramps.  You continue to vomit or have diarrhea. Get help right away if you:  Have symptoms of dehydration that get worse.  Have a fever.  Have a severe headache.  Have been vomiting and the following happens: ? Your vomiting gets worse or does not go away. ? Your vomit includes blood or green matter (bile). ? You cannot eat or drink without vomiting.  Have problems with urination or bowel movements, such as: ? Diarrhea that gets worse or does not go away. ? Blood in your stool (feces). This may cause stool to look black and tarry. ? Not urinating, or urinating only a small amount of very dark urine, within 6-8 hours.  Have trouble breathing.  Have symptoms that get worse with treatment. These symptoms may represent a serious problem that is an emergency. Do not wait to see if the symptoms will go away. Get medical help right away. Call your local emergency services (911 in the [place] Do not drive yourself to the hospital. Summary  Rehydration is the replacement of body fluids and minerals (electrolytes) that are lost during dehydration.  Follow instructions from your health care provider for rehydration. The kind of fluid and amount you should drink depend on your condition.  Slowly increase how much you drink until you have taken the amount recommended by your health care provider.  Contact your health care provider if you continue to show signs of mild or moderate dehydration. This information is not intended to replace advice given to you by your health care provider. Make sure you discuss any questions you have with  your health care provider. Document Revised: 11/01/2019 Document Reviewed: 09/11/2019 Elsevier Patient Education  2021 [employer] Inc.  

## 2021-02-14 NOTE — Telephone Encounter (Signed)
I contacted the patient to go over the details regarding his CT Abdomen & Pelvis w/o contrast. I advised the patient to arrive to [facility] by 10:15 am, NPO 4 hours prior to the scan, and to drink his 1st bottle at 8:30 am & 2nd bottle at 9:30 am. The patient verbalized understanding and requested that I send this information in a MyChart message to help remind him.

## 2021-02-15 ENCOUNTER — Inpatient Hospital Stay: Payer: BC Managed Care – PPO

## 2021-02-15 VITALS — BP 132/95 | HR 70 | Resp 18

## 2021-02-15 DIAGNOSIS — C182 Malignant neoplasm of ascending colon: Secondary | ICD-10-CM

## 2021-02-15 DIAGNOSIS — C772 Secondary and unspecified malignant neoplasm of intra-abdominal lymph nodes: Secondary | ICD-10-CM

## 2021-02-15 MED ORDER — SODIUM CHLORIDE 0.9% FLUSH
10.0000 mL | INTRAVENOUS | Status: DC | PRN
  Administered 2021-02-15: 10 mL
  Filled 2021-02-15: qty 10

## 2021-02-15 MED ORDER — HEPARIN SOD (PORK) LOCK FLUSH 100 UNIT/ML IV SOLN
500.0000 [IU] | Freq: Once | INTRAVENOUS | Status: AC | PRN
  Administered 2021-02-15: 500 [IU]
  Filled 2021-02-15: qty 5

## 2021-02-15 MED ORDER — SODIUM CHLORIDE 0.9 % IV SOLN
INTRAVENOUS | Status: AC
  Filled 2021-02-15 (×2): qty 250

## 2021-02-15 NOTE — Patient Instructions (Signed)
Dehydration, Adult Dehydration is condition in which there is not enough water or other fluids in the body. This happens when a person loses more fluids than he or she takes in. Important body parts cannot work right without the right amount of fluids. Any loss of fluids from the body can cause dehydration. Dehydration can be mild, worse, or very bad. It should be treated right away to keep it from getting very bad. What are the causes? This condition may be caused by:  Conditions that cause loss of water or other fluids, such as: ? Watery poop (diarrhea). ? Vomiting. ? Sweating a lot. ? Peeing (urinating) a lot.  Not drinking enough fluids, especially when you: ? Are ill. ? Are doing things that take a lot of energy to do.  Other illnesses and conditions, such as fever or infection.  Certain medicines, such as medicines that take extra fluid out of the body (diuretics).  Lack of safe drinking water.  Not being able to get enough water and food. What increases the risk? The following factors may make you more likely to develop this condition:  Having a long-term (chronic) illness that has not been treated the right way, such as: ? Diabetes. ? Heart disease. ? Kidney disease.  Being 65 years of age or older.  Having a disability.  Living in a place that is high above the ground or sea (high in altitude). The thinner, dried air causes more fluid loss.  Doing exercises that put stress on your body for a long time. What are the signs or symptoms? Symptoms of dehydration depend on how bad it is. Mild or worse dehydration  Thirst.  Dry lips or dry mouth.  Feeling dizzy or light-headed, especially when you stand up from sitting.  Muscle cramps.  Your body making: ? Dark pee (urine). Pee may be the color of tea. ? Less pee than normal. ? Less tears than normal.  Headache. Very bad dehydration  Changes in skin. Skin may: ? Be cold to the touch (clammy). ? Be blotchy  or pale. ? Not go back to normal right after you lightly pinch it and let it go.  Little or no tears, pee, or sweat.  Changes in vital signs, such as: ? Fast breathing. ? Low blood pressure. ? Weak pulse. ? Pulse that is more than 100 beats a minute when you are sitting still.  Other changes, such as: ? Feeling very thirsty. ? Eyes that look hollow (sunken). ? Cold hands and feet. ? Being mixed up (confused). ? Being very tired (lethargic) or having trouble waking from sleep. ? Short-term weight loss. ? Loss of consciousness. How is this treated? Treatment for this condition depends on how bad it is. Treatment should start right away. Do not wait until your condition gets very bad. Very bad dehydration is an emergency. You will need to go to a hospital.  Mild or worse dehydration can be treated at home. You may be asked to: ? Drink more fluids. ? Drink an oral rehydration solution (ORS). This drink helps get the right amounts of fluids and salts and minerals in the blood (electrolytes).  Very bad dehydration can be treated: ? With fluids through an IV tube. ? By getting normal levels of salts and minerals in your blood. This is often done by giving salts and minerals through a tube. The tube is passed through your nose and into your stomach. ? By treating the root cause. Follow these instructions at   home: Oral rehydration solution If told by your doctor, drink an ORS:  Make an ORS. Use instructions on the package.  Start by drinking small amounts, about  cup (120 mL) every 5-10 minutes.  Slowly drink more until you have had the amount that your doctor said to have. Eating and drinking  Drink enough clear fluid to keep your pee pale yellow. If you were told to drink an ORS, finish the ORS first. Then, start slowly drinking other clear fluids. Drink fluids such as: ? Water. Do not drink only water. Doing that can make the salt (sodium) level in your body get too low. ? Water  from ice chips you suck on. ? Fruit juice that you have added water to (diluted). ? Low-calorie sports drinks.  Eat foods that have the right amounts of salts and minerals, such as: ? Bananas. ? Oranges. ? Potatoes. ? Tomatoes. ? Spinach.  Do not drink alcohol.  Avoid: ? Drinks that have a lot of sugar. These include:  High-calorie sports drinks.  Fruit juice that you did not add water to.  Soda.  Caffeine. ? Foods that are greasy or have a lot of fat or sugar.         General instructions  Take over-the-counter and prescription medicines only as told by your doctor.  Do not take salt tablets. Doing that can make the salt level in your body get too high.  Return to your normal activities as told by your doctor. Ask your doctor what activities are safe for you.  Keep all follow-up visits as told by your doctor. This is important. Contact a doctor if:  You have pain in your belly (abdomen) and the pain: ? Gets worse. ? Stays in one place.  You have a rash.  You have a stiff neck.  You get angry or annoyed (irritable) more easily than normal.  You are more tired or have a harder time waking than normal.  You feel: ? Weak or dizzy. ? Very thirsty. Get help right away if you have:  Any symptoms of very bad dehydration.  Symptoms of vomiting, such as: ? You cannot eat or drink without vomiting. ? Your vomiting gets worse or does not go away. ? Your vomit has blood or green stuff in it.  Symptoms that get worse with treatment.  A fever.  A very bad headache.  Problems with peeing or pooping (having a bowel movement), such as: ? Watery poop that gets worse or does not go away. ? Blood in your poop (stool). This may cause poop to look black and tarry. ? Not peeing in 6-8 hours. ? Peeing only a small amount of very dark pee in 6-8 hours.  Trouble breathing. These symptoms may be an emergency. Do not wait to see if the symptoms will go away. Get  medical help right away. Call your local emergency services (911 in the [place] Do not drive yourself to the hospital. Summary  Dehydration is a condition in which there is not enough water or other fluids in the body. This happens when a person loses more fluids than he or she takes in.  Treatment for this condition depends on how bad it is. Treatment should be started right away. Do not wait until your condition gets very bad.  Drink enough clear fluid to keep your pee pale yellow. If you were told to drink an oral rehydration solution (ORS), finish the ORS first. Then, start slowly drinking other clear fluids.    Take over-the-counter and prescription medicines only as told by your doctor.  Get help right away if you have any symptoms of very bad dehydration. This information is not intended to replace advice given to you by your health care provider. Make sure you discuss any questions you have with your health care provider. Document Revised: 04/13/2019 Document Reviewed: 04/13/2019 Elsevier Patient Education  [place] An implanted port is a device that is placed under the skin. It is usually placed in the chest. The device can be used to give IV medicine, to take blood, or for dialysis. You may have an implanted port if:  You need IV medicine that would be irritating to the small veins in your hands or arms.  You need IV medicines, such as antibiotics, for a long period of time.  You need IV nutrition for a long period of time.  You need dialysis. When you have a port, your health care provider can choose to use the port instead of veins in your arms for these procedures. You may have fewer limitations when using a port than you would if you used other types of long-term IVs, and you will likely be able to return to normal activities after your incision heals. An implanted port has two main parts:  Reservoir. The reservoir is the part where a needle  is inserted to give medicines or draw blood. The reservoir is round. After it is placed, it appears as a small, raised area under your skin.  Catheter. The catheter is a thin, flexible tube that connects the reservoir to a vein. Medicine that is inserted into the reservoir goes into the catheter and then into the vein. How is my port accessed? To access your port:  A numbing cream may be placed on the skin over the port site.  Your health care provider will put on a mask and sterile gloves.  The skin over your port will be cleaned carefully with a germ-killing soap and allowed to dry.  Your health care provider will gently pinch the port and insert a needle into it.  Your health care provider will check for a blood return to make sure the port is in the vein and is not clogged.  If your port needs to remain accessed to get medicine continuously (constant infusion), your health care provider will place a clear bandage (dressing) over the needle site. The dressing and needle will need to be changed every week, or as told by your health care provider. What is flushing? Flushing helps keep the port from getting clogged. Follow instructions from your health care provider about how and when to flush the port. Ports are usually flushed with saline solution or a medicine called heparin. The need for flushing will depend on how the port is used:  If the port is only used from time to time to give medicines or draw blood, the port may need to be flushed: ? Before and after medicines have been given. ? Before and after blood has been drawn. ? As part of routine maintenance. Flushing may be recommended every 4-6 weeks.  If a constant infusion is running, the port may not need to be flushed.  Throw away any syringes in a disposal container that is meant for sharp items (sharps container). You can buy a sharps container from a pharmacy, or you can make one by using an empty hard plastic bottle with a  cover. How long will my port  stay implanted? The port can stay in for as long as your health care provider thinks it is needed. When it is time for the port to come out, a surgery will be done to remove it. The surgery will be similar to the procedure that was done to put the port in. Follow these instructions at home:  Flush your port as told by your health care provider.  If you need an infusion over several days, follow instructions from your health care provider about how to take care of your port site. Make sure you: ? Wash your hands with soap and water before you change your dressing. If soap and water are not available, use alcohol-based hand sanitizer. ? Change your dressing as told by your health care provider. ? Place any used dressings or infusion bags into a plastic bag. Throw that bag in the trash. ? Keep the dressing that covers the needle clean and dry. Do not get it wet. ? Do not use scissors or sharp objects near the tube. ? Keep the tube clamped, unless it is being used.  Check your port site every day for signs of infection. Check for: ? Redness, swelling, or pain. ? Fluid or blood. ? Pus or a bad smell.  Protect the skin around the port site. ? Avoid wearing bra straps that rub or irritate the site. ? Protect the skin around your port from seat belts. Place a soft pad over your chest if needed.  Bathe or shower as told by your health care provider. The site may get wet as long as you are not actively receiving an infusion.  Return to your normal activities as told by your health care provider. Ask your health care provider what activities are safe for you.  Carry a medical alert card or wear a medical alert bracelet at all times. This will let health care providers know that you have an implanted port in case of an emergency.   Get help right away if:  You have redness, swelling, or pain at the port site.  You have fluid or blood coming from your port  site.  You have pus or a bad smell coming from the port site.  You have a fever. Summary  Implanted ports are usually placed in the chest for long-term IV access.  Follow instructions from your health care provider about flushing the port and changing bandages (dressings).  Take care of the area around your port by avoiding clothing that puts pressure on the area, and by watching for signs of infection.  Protect the skin around your port from seat belts. Place a soft pad over your chest if needed.  Get help right away if you have a fever or you have redness, swelling, pain, drainage, or a bad smell at the port site. This information is not intended to replace advice given to you by your health care provider. Make sure you discuss any questions you have with your health care provider. Document Revised: 01/15/2020 Document Reviewed: 01/15/2020 Elsevier Patient Education  [place].

## 2021-02-18 ENCOUNTER — Telehealth: Payer: Self-pay | Admitting: Hematology

## 2021-02-18 NOTE — Telephone Encounter (Signed)
Left message with follow-up appointments per 6/2 los. Gave option to call back to reschedule if needed.

## 2021-02-20 ENCOUNTER — Telehealth: Payer: Self-pay

## 2021-02-20 NOTE — Telephone Encounter (Signed)
Patient call with complaint of two large hemmorhoids and feels like there are breaks in the skin around his anus.  He says that at times his stool comes out round and sometimes flat.  He has been using Preparation H.  I have instructed him to also use warm baths to soak the anal area or sitz bath.  He may have to use the sitz bath a couple of times a day.  Also that Dr. Burr Medico will probably need to examine the area when he comes in next week.     He states he has had a great week with the exception of not sleeping.  He has not been taking the Ambien and did not pick up the last script we sent in on 01/30/2021.  I have encouraged him to call his pharmacy and pick this up and resume taking.  He has agreed.

## 2021-02-24 ENCOUNTER — Ambulatory Visit (HOSPITAL_COMMUNITY)
Admission: RE | Admit: 2021-02-24 | Discharge: 2021-02-24 | Disposition: A | Discharge status: Home / Self Care | Payer: BC Managed Care – PPO | Source: Ambulatory Visit | Attending: Hematology | Admitting: Hematology

## 2021-02-24 ENCOUNTER — Other Ambulatory Visit: Payer: Self-pay

## 2021-02-24 DIAGNOSIS — C772 Secondary and unspecified malignant neoplasm of intra-abdominal lymph nodes: Secondary | ICD-10-CM | POA: Insufficient documentation

## 2021-02-24 DIAGNOSIS — C182 Malignant neoplasm of ascending colon: Secondary | ICD-10-CM | POA: Diagnosis not present

## 2021-02-25 NOTE — Progress Notes (Signed)
Called patient to discuss CT results from yesterday.  Per Dr. Burr Medico informed patient good response to chemotherapy.  He verbalized an understanding.  Patient states he would like to cancel his treatments for this week as he has contacted [facility] in [place] and has his consult with them on Monday 03/03/2021.  He does want to come in and discuss this with Dr. Burr Medico but cannot come on Thursday 02/27/2021 as scheduled because he has a telephone consult with the physician there.  I have rescheduled him for Monday 03/03/2021 at 10:00 and asked that he arrive by 9:45.  He has agreed to this time.  I have made Dr. Burr Medico aware.

## 2021-02-26 NOTE — Progress Notes (Addendum)
Ricardo [name]   [phone] [phone]   Clinic Follow up Note   Patient Care Team: [name], MD as PCP - General (Internal Medicine) [name], MD as Consulting Physician (Oncology) [name], RN as Oncology Nurse Navigator  Date of Service:  03/03/2021  CHIEF COMPLAINT: f/u of metastatic colon cancer  SUMMARY OF ONCOLOGIC HISTORY: Oncology History Overview Note  Cancer Staging metastatic cecal cancer Staging form: Colon and Rectum, AJCC 8th Edition - Clinical stage from 11/29/2020: Stage IVC (cTX, cN2, pM1c) - Signed by [name], MD on 12/04/2020 Stage prefix: Initial diagnosis Histologic grade (G): G3 Histologic grading system: 4 grade system    metastatic cecal cancer  11/27/2020 Imaging   CT Angio CAP  IMPRESSION: 1. Thickening of the terminal ileum with associated proximal mid to distal small bowel obstruction. Findings could be due to an ileitis versus malignancy. Nonspecific mesenteric edema could be due to engorgement versus metastases. No associated bowel perforation. Recommend endoscopy for further evaluation. 2. Indeterminate right lower quadrant lymphadenopathy. 3. Scattered colonic diverticulosis with no acute diverticulitis. 4. Stable right hepatic lobe subcentimeter hyperdensity likely represents a hepatic hemangioma. 5. No acute vascular abnormality. Aortic Atherosclerosis (ICD10-I70.0) - mild. 6. No acute intrathoracic abnormality.   11/28/2020 Imaging   CT AP  IMPRESSION: 1. There is masslike, circumferential thickening of the terminal ileum and cecal base near the ileocecal valve and abnormally enlarged lymph nodes in the right lower quadrant mesentery adjacent to the terminal ileum measuring up to 2.3 x 1.4 cm. 2. There is extensive omental and peritoneal nodularity and caking throughout the abdomen. 3. Findings are highly concerning for primary colon malignancy with nodal and peritoneal  metastatic disease. 4. Small volume perihepatic and perisplenic ascites. 5. The small bowel is generally decompressed, with full some fluid-filled, nondistended loops throughout. There is transit of oral enteric contrast to the terminal ileum. No evidence of overt bowel obstruction at this time. Esophagogastric tube is position with tip and side port below the diaphragm. 6. Atelectasis or consolidation of the dependent bilateral lung bases, new compared to prior examination.   Aortic Atherosclerosis (ICD10-I70.0).     11/29/2020 Surgery   EXPLORATORY LAPAROTOMY, PARTIAL BOWEL RESECTION, POSSIBLE OSTOMY CREATION, PERITIONEAL BIOPSY, INSERTION OF GASTROSTOMY TUBE by Dr [name]   11/29/2020 Initial Biopsy   FINAL MICROSCOPIC DIAGNOSIS:   AB. 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    COMMENT:   Immunohistochemistry (IHC) for CK20 and CDX-2 is strong and diffusely  positive.  CK7, TTF-1, Synaptophysin, Chromogranin and CD56 are  negative.  The immunophenotype is compatible with origin from the lower  gastrointestinal tract.  IHC for MMR will be reported separately.  Case  preliminarily discussed with Dr. [name] on 12/02/2020.   At the request of Dr. [name], [phone] was reviewed in retrospect.  Review of the submitted sections confirms the presence of acute  appendicitis.  No malignancy is identified.    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 Wall thickening of the terminal ileum again seen. Fluid-filled distal small bowel, ascending, and transverse colon without obstruction. 2. Equivocal improvement in omental and peritoneal metastatic disease with slightly improved small volume perihepatic and perisplenic ascites. Right lower quadrant mesenteric adenopathy is similar or mildly improved. 3. Sigmoid colonic diverticulosis. Mild mural wall thickening in the sigmoid, no acute diverticulitis. 4. Gastrostomy tube in place, balloon normally positioned in the stomach. 5. Chronic bilateral L5 pars interarticularis defects with trace anterolisthesis of L5 on S1. Chronic avascular necrosis of the left femoral head.   01/16/2021 Genetic Testing   Negative genetic testing. CTNNA1 [id number] VUS, RAD51D p.G140E VUS and TSC1 p.R768H VUS found on the CancerNext-Expanded+RNAinsight.  The CancerNext-Expanded gene panel offered by [facility] and includes sequencing and rearrangement analysis for the following 77 genes: AIP, ALK, APC*, ATM*, AXIN2, BAP1, BARD1, BLM, BMPR1A, BRCA1*, BRCA2*, BRIP1*, CDC73, CDH1*, CDK4, CDKN1B, CDKN2A, CHEK2*, CTNNA1, DICER1, FANCC, FH, FLCN, GALNT12, KIF1B, LZTR1, MAX, MEN1, MET, MLH1*, MSH2*, MSH3, MSH6*, MUTYH*, NBN, NF1*, NF2, NTHL1, PALB2*, PHOX2B, PMS2*, POT1, PRKAR1A, PTCH1, PTEN*, RAD51C*, RAD51D*, RB1, RECQL, RET, SDHA, SDHAF2, SDHB, SDHC, SDHD, SMAD4, SMARCA4, SMARCB1, SMARCE1, STK11, SUFU, TMEM127, TP53*, TSC1, TSC2, VHL and XRCC2 (sequencing and deletion/duplication); EGFR, EGLN1, HOXB13, KIT, MITF, PDGFRA, POLD1, and POLE (sequencing only); EPCAM and GREM1 (deletion/duplication only). DNA and RNA analyses performed for * genes. The report date is Jan 16, 2021.    02/24/2021 Imaging   CT AP  IMPRESSION: 1. Interval resolution of previously seen small volume ascites throughout the abdomen and pelvis. There is redemonstrated, ill-defined stranding and thickening of the peritoneal surfaces and omentum, which is somewhat diminished compared to prior examination. 2. Interval improvement in right lower quadrant mesocolon lymph nodes. 3. Findings are consistent with treatment response of nodal and peritoneal metastatic disease. 4. Unchanged, matted appearing thickening of the terminal ileum and cecal base. 5. Sigmoid diverticulosis with wall thickening of the mid to distal sigmoid, similar to prior examination. Findings are most suggestive of chronic sequelae of diverticulitis.   Aortic Atherosclerosis (ICD10-I70.0).   03/13/2021 -  Chemotherapy    Patient is on Treatment Plan: PANCREAS MODIFIED FOLFIRINOX Q14D X 8 CYCLES   Patient is on Antibody Plan: COLORECTAL BEVACIZUMAB Q14D        CURRENT THERAPY:  First line FOLFOX q2 weeks starting 12/18/20. Bevacizumab added with cycle 2. Added Irinotecan from C6 on 03/13/21.   INTERVAL HISTORY:  Ricardo [name] is here for a follow up of metastatic colon cancer. He was last seen by me 02/13/21. He presents to the clinic with his wife. He notes he feels better on his off week of chemo. He plans to leave for [facility] tomorrow and be back the next day. He notes frustration with surgeon not being involved earlier. He plans to get surgical opinion at [place] in 2 days. There he will also see there whole medical team. He notes he very concerned his diet and wants more hands on help. He has been able to gain weight lately and be remain active. He notes he is sleeping better.  He notes due to severe diarrhea, he developed herniated rectum. He notes this causes issues with BM and very uncomfortable.  He is managing on strict diet stool softener. He would like to get this fixed. He also has an external  hemorrhoid that he can palpate with wiping. He notes a case at [facility] clinic, where her primary tumor was removed only and watched her remaining tumors.     REVIEW OF SYSTEMS:   Constitutional: Denies fevers, chills or abnormal weight loss Eyes: Denies blurriness of vision Ears, nose, mouth, throat, and face: Denies mucositis or sore throat Respiratory: Denies cough, dyspnea or wheezes Cardiovascular: Denies palpitation, chest discomfort or lower extremity swelling Gastrointestinal:  Denies nausea, heartburn or change in bowel habits Skin: Denies abnormal skin rashes Lymphatics: Denies new lymphadenopathy or easy bruising Neurological:Denies numbness, tingling or new weaknesses Behavioral/Psych: Mood is stable, no new changes  All other systems were reviewed with the patient and are negative.  MEDICAL HISTORY:  Past Medical History:  Diagnosis Date   Arthritis    Colon cancer ([place])    with metastasis   Family history of adverse reaction to anesthesia    mother had problem with it due to her asthma   Family history of breast cancer    Family history of prostate cancer     SURGICAL HISTORY: Past Surgical History:  Procedure Laterality Date   BOWEL RESECTION N/A 11/29/2020   Procedure: PARTIAL BOWEL RESECTION;  Surgeon: Jovita Kussmaul, MD;  Location: [place];  Service: General;  Laterality: N/A;   GASTROSTOMY Left 11/29/2020   Procedure: INSERTION OF GASTROSTOMY TUBE;  Surgeon: Jovita Kussmaul, MD;  Location: [place];  Service: General;  Laterality: Left;   IR IMAGING GUIDED PORT INSERTION  12/12/2020   LAPAROSCOPIC APPENDECTOMY N/A 01/17/2019   Procedure: APPENDECTOMY LAPAROSCOPIC;  Surgeon: Ralene Ok, MD;  Location: [place];  Service: General;  Laterality: N/A;   LAPAROTOMY N/A 11/29/2020   Procedure: EXPLORATORY LAPAROTOMY;  Surgeon: Jovita Kussmaul, MD;  Location: [place];  Service: General;  Laterality: N/A;  PUT CASE IN ROOM 1 STARTING AT 9:30AM FOR 120 MIN   OSTOMY N/A 11/29/2020    Procedure: POSSIBLE OSTOMY CREATION;  Surgeon: Jovita Kussmaul, MD;  Location: [place];  Service: General;  Laterality: N/A;   RECTAL BIOPSY N/A 11/29/2020   Procedure: PERITIONEAL BIOPSY;  Surgeon: Jovita Kussmaul, MD;  Location: [place];  Service: General;  Laterality: N/A;   SHOULDER SURGERY Left 09/19/2015    I have reviewed the social history and family history with the patient and they are unchanged from previous note.  ALLERGIES:  has No Known Allergies.  MEDICATIONS:  Current Outpatient Medications  Medication Sig Dispense Refill   acetaminophen (TYLENOL) 500 MG tablet Take 1,000 mg by mouth every 6 (six) hours as needed for mild pain.     dicyclomine (BENTYL) 10 MG capsule Take 1 capsule (10 mg total) by mouth 4 (four) times daily -  before meals and at bedtime. 120 capsule 1   diphenoxylate-atropine (LOMOTIL) 2.5-0.025 MG tablet Take 2 tablets by mouth 4 (four) times daily as needed for diarrhea or loose stools. 60 tablet 1   oxyCODONE (OXY IR/ROXICODONE) 5 MG immediate release tablet Take 1 tablet (5 mg total) by mouth every 6 (six) hours as needed for severe pain. 60 tablet 0   prochlorperazine (COMPAZINE) 10 MG tablet Take 1 tablet (10 mg total) by mouth every 6 (six) hours as needed for nausea or vomiting. 30 tablet 2   zolpidem (AMBIEN) 5 MG tablet Take 1 tablet (5 mg total) by mouth at bedtime as needed  for sleep. 30 tablet 0   Current Facility-Administered Medications  Medication Dose Route Frequency Provider Last Rate Last Admin   0.9 %  sodium chloride infusion   Intravenous PRN Causey, Charlestine Massed, NP        PHYSICAL EXAMINATION: ECOG PERFORMANCE STATUS: 1 - Symptomatic but completely ambulatory  Vitals:   03/03/21 1006  BP: (!) 144/84  Pulse: 90  Resp: 18  Temp: 97.9 F (36.6 C)  SpO2: 100%   Filed Weights   03/03/21 1006  Weight: 241 lb 9.6 oz (109.6 kg)    Due to COVID19 we will limit examination to appearance. Patient had no complaints.  GENERAL:alert,  no distress and comfortable SKIN: skin color normal, no rashes or significant lesions EYES: normal, Conjunctiva are pink and non-injected, sclera clear  NEURO: alert & oriented x 3 with fluent speech    LABORATORY DATA:  I have reviewed the data as listed CBC Latest Ref Rng & Units 02/13/2021 02/04/2021 01/30/2021  WBC 4.0 - 10.5 K/uL 4.3 7.2 8.4  Hemoglobin 13.0 - 17.0 g/dL 13.0 13.6 13.0  Hematocrit 39.0 - 52.0 % 36.6(L) 39.4 36.9(L)  Platelets 150 - 400 K/uL 153 259 205     CMP Latest Ref Rng & Units 02/13/2021 02/04/2021 01/30/2021  Glucose 70 - 99 mg/dL 90 80 85  BUN 6 - 20 mg/dL '11 15 11  ' Creatinine 0.61 - 1.24 mg/dL 1.11 0.92 0.95  Sodium 135 - 145 mmol/L 138 136 140  Potassium 3.5 - 5.1 mmol/L 4.0 3.9 4.0  Chloride 98 - 111 mmol/L 106 105 106  CO2 22 - 32 mmol/L '22 23 25  ' Calcium 8.9 - 10.3 mg/dL 8.9 8.5(L) 9.0  Total Protein 6.5 - 8.1 g/dL 7.1 7.4 6.9  Total Bilirubin 0.3 - 1.2 mg/dL 0.5 0.8 0.5  Alkaline Phos 38 - 126 U/L 96 82 93  AST 15 - 41 U/L '16 21 19  ' ALT 0 - 44 U/L '22 27 24      ' RADIOGRAPHIC STUDIES: I have personally reviewed the radiological images as listed and agreed with the findings in the report. No results found.   ASSESSMENT & PLAN:  Ricardo [name] is a 55 y.o. male with    1. 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 Work up showed mass in the terminal ileum with extensive omental nodularity and LN involvement. His baseline tumor marker CA 19-9 and CEA were normal. -Ex lap surgery with G-tube placement with Dr [name] on 11/29/20 he was found to have diffuse peritoneal metastasis and omental biopsy confirmed poorly differentiated adenocarcinoma with focal signet ring cell features and IHC studies support low GI  primary. This confirms stage IV metastatic cancer from cecum -I started hom on first-line palliative chemo with FOLFOX on 12/18/20.  If he has excellent response to treatment we may refer him for HIPEC debulking surgery in the future. -FO shows KRAS/NRAS wildtype, but given his right side colon cancer, we do not use EGFR inhibitor in first line.  -Bevacizumab added with cycle 2. Experienced worsening abdominal pain, nausea, and vomiting the day after cycle 2, prompting an ED visit. NO recurrent symptoms since he changed back to soft and low residual diet -We discussed his CT AP from 02/24/21 which showed Interval resolution of previously seen small volume ascites, Interval  improvement in right lower quadrant mesocolon lymph nodes, Findings are consistent with treatment response of nodal and peritoneal metastatic disease. I personally reviewed scan images and discussed with patient today. I discussed limitations with this Scan given no contrast.  -I again reviewed the role of HIPEC surgery, I reviewed with the logistics and complications with patient.  I am not sure if he is a candidate due to the bulky disease.  Since he has had good response to chemotherapy, I think surgical consultation is reasonable. -He plans to have surgical consult at [facility] in [place] this week. I will also refer him to Dr Clovis Riley for another surgical consult. I discussed his surgical symptoms.  -I discussed the option of increasing his treatment to FOLFIRINOX q2weeks starting with cycle 6 on 03/13/21 to better control his disease.  I discussed this will come with more side effects, especially diarrhea. I would start him at low dose. He voiced a good understanding and is agreeable.  -F/u next week before cycle 6 treatment    2. Symptom Management: Constipation, lower abdominal pain, abdominal bloating secondary #1 and partial bowl obstruction, Herniated Rectum -For 8-9 weeks before hospitalization he was having  increasing lower abdominal pain and constipation -He did have G-tube placed with surgery on 11/29/20. He has not needed to use this since starting chemo. -Since starting chemo 12/18/20 he started dicyclomine which helps tremendously.  -He experienced nausea, vomiting, and cramping day two of cycle 2, prompting ED visit. He has been back on a soft diet, which has helped. He is feeling well again -He notes he would like to receive IVF prior to treatment in the future to see if this helps. -He notes after bout of severe diarrhea, he developed a herniated rectum and external hemorrhoid which is uncomfortable and effect having BM. He so far is able to manage with strict diet and  stool softener. I recommend SITS bath for now and will watch closely.  -He notes he would like more help with his diet management. I will refer him to another nutritionist.   3. Genetic testing -He has brother with prostate cancer and sister with breast cancer in their 59s. -Genetics counseling and labs on 12/25/20, results negative.   4. Social Support -He is an avid cyclist. Last long distant riding was 06/2020 across [place]. He has not biked in the 8-9 weeks before cancer diagnosis due to symptoms. His weight heavily fluctuates with changes in his activity level, diet and biking. -He is married with 1 adult son. His brother Jenny Reichmann is in town. He works as a Designer, television/film set at [employer]. He may take time off work based on how to tolerate chemotherapy. -He was previously offered [facility] resources including chaplin and SW if needed. He notes he has good family support    5. Covid-19+ -Positive on at-home test on 01/20/21 -Received bebtelovimab on 01/24/21 -has recovered completely      Plan: -Proceed with Consult at [place] this week  -Lab, Flush, F/u and FOLFIRINOX and beva next week, with pegfilgramstin on day 3. Will add low dose irinitecan to his current FOLFOX regimen -referral to Dr. Clovis Riley at  [facility]   No problem-specific [place] notes found for this encounter.   Orders Placed This Encounter  Procedures   Ambulatory referral to General Surgery    Referral Priority:   Routine    Referral Type:   Surgical    Referral Reason:   Specialty Services  Required    Requested Specialty:   General Surgery    Number of Visits Requested:   1    All questions were answered. The patient knows to call the clinic with any problems, questions or concerns. No barriers to learning was detected. The total time spent in the appointment was 40 minutes.     [name], MD 03/03/2021   I, Joslyn Devon, am acting as scribe for [name], MD.   I have reviewed the above documentation for accuracy and completeness, and I agree with the above.

## 2021-02-27 ENCOUNTER — Inpatient Hospital Stay: Payer: BC Managed Care – PPO | Admitting: Hematology

## 2021-02-27 ENCOUNTER — Inpatient Hospital Stay: Payer: BC Managed Care – PPO

## 2021-02-27 ENCOUNTER — Inpatient Hospital Stay: Payer: BC Managed Care – PPO | Admitting: Nutrition

## 2021-02-28 ENCOUNTER — Inpatient Hospital Stay: Payer: BC Managed Care – PPO

## 2021-03-01 ENCOUNTER — Ambulatory Visit: Payer: BC Managed Care – PPO

## 2021-03-03 ENCOUNTER — Other Ambulatory Visit: Payer: Self-pay

## 2021-03-03 ENCOUNTER — Inpatient Hospital Stay (HOSPITAL_BASED_OUTPATIENT_CLINIC_OR_DEPARTMENT_OTHER): Payer: BC Managed Care – PPO | Admitting: Hematology

## 2021-03-03 ENCOUNTER — Encounter: Payer: Self-pay | Admitting: Hematology

## 2021-03-03 VITALS — BP 144/84 | HR 90 | Temp 97.9°F | Resp 18 | Ht 71.0 in | Wt 241.6 lb

## 2021-03-03 DIAGNOSIS — R197 Diarrhea, unspecified: Secondary | ICD-10-CM | POA: Diagnosis not present

## 2021-03-03 DIAGNOSIS — C182 Malignant neoplasm of ascending colon: Secondary | ICD-10-CM

## 2021-03-03 DIAGNOSIS — C786 Secondary malignant neoplasm of retroperitoneum and peritoneum: Secondary | ICD-10-CM | POA: Diagnosis not present

## 2021-03-03 DIAGNOSIS — C772 Secondary and unspecified malignant neoplasm of intra-abdominal lymph nodes: Secondary | ICD-10-CM | POA: Diagnosis not present

## 2021-03-03 MED ORDER — DIPHENOXYLATE-ATROPINE 2.5-0.025 MG PO TABS: 2.0000 | ORAL_TABLET | Freq: Four times a day (QID) | ORAL | 1 refills | Status: DC | PRN

## 2021-03-04 ENCOUNTER — Telehealth: Payer: Self-pay | Admitting: Nutrition

## 2021-03-04 NOTE — Telephone Encounter (Signed)
Contacted patient and offered nutrition appointment for more detailed information. He was not available but I left a message with my name and phone number for return call.

## 2021-03-04 NOTE — Progress Notes (Signed)
Faxed referral a total of 36 pages to Dr. Erskine Emery office for surgical consideration for HIPEC surgery.  I included all scan results, pathology results, Dr. Ernestina Penna last OV note, demographics and insurance card information.  I have asked that his office let me know when he is scheduled.  They were given my direct call back number.

## 2021-03-06 NOTE — Progress Notes (Signed)
Patient calls to let us know that he went down to [facility] in [place] and will not be going back.  He was very unhappy with that experience.  He plans to continue his care with Dr. Burr Medico.  He also was inquiring about his referral to Dr. Clovis Riley.  I explained I had faxed all of his information over to his office on 03/04/2021 and I had spoken with his scheduler this morning.  It is in process. Dr. Clovis Riley has to review prior to scheduling and that she will let me know when this is scheduled.   He verbalized an understanding.

## 2021-03-06 NOTE — Progress Notes (Signed)
Spoke with Ricardo [name] at Dr. Georgina Snell office regarding referral we sent over on 03/04/2021.  She states it has been sent to him for approval and once he approves she will get him scheduled.  I asked that she call me back on my direct line once this is done.

## 2021-03-06 NOTE — Progress Notes (Signed)
Pharmacist Chemotherapy Monitoring - Initial Assessment    Anticipated start date: 03/13/21   Regimen:  Are orders appropriate based on the patient's diagnosis, regimen, and cycle? Yes Does the plan date match the patient's scheduled date? Yes Is the sequencing of drugs appropriate? Yes Are the premedications appropriate for the patient's regimen? Yes Prior Authorization for treatment is: Pending If applicable, is the correct biosimilar selected based on the patient's insurance? not applicable  Organ Function and Labs: Are dose adjustments needed based on the patient's renal function, hepatic function, or hematologic function? Yes Are appropriate labs ordered prior to the start of patient's treatment? Yes Other organ system assessment, if indicated: N/A The following baseline labs, if indicated, have been ordered: N/A  Dose Assessment: Are the drug doses appropriate? Yes Are the following correct: Drug concentrations Yes IV fluid compatible with drug Yes Administration routes Yes Timing of therapy Yes If applicable, does the patient have documented access for treatment and/or plans for port-a-cath placement? yes If applicable, have lifetime cumulative doses been properly documented and assessed? not applicable Lifetime Dose Tracking   Oxaliplatin: 436.308 mg/m2 (1,000 mg) = 72.72 % of the maximum lifetime dose of 600 mg/m2    Toxicity Monitoring/Prevention: The patient has the following take home antiemetics prescribed: Prochlorperazine The patient has the following take home medications prescribed: N/A Medication allergies and previous infusion related reactions, if applicable, have been reviewed and addressed. Yes The patient's current medication list has been assessed for drug-drug interactions with their chemotherapy regimen. no significant drug-drug interactions were identified on review.  Order Review: Are the treatment plan orders signed? Yes Is the patient scheduled to  see a provider prior to their treatment? Yes  I verify that I have reviewed each item in the above checklist and answered each question accordingly.  Ricardo [name], [place], 03/06/2021  11:40 AM

## 2021-03-10 NOTE — Progress Notes (Signed)
Received call from [facility] - Dr. Georgina Snell office to let us know patient has been scheduled for consult with Dr. Clovis Riley for 04/09/2021 at 9:30.  They have left him a message and are mailing him a new patient packet.

## 2021-03-12 MED FILL — Fosaprepitant Dimeglumine For IV Infusion 150 MG (Base Eq): INTRAVENOUS | Qty: 5 | Status: AC

## 2021-03-12 MED FILL — Dexamethasone Sodium Phosphate Inj 100 MG/10ML: INTRAMUSCULAR | Qty: 1 | Status: AC

## 2021-03-12 NOTE — Progress Notes (Deleted)
[place]   Telephone:([phone]) [phone] Fax:(336) [phone]   Clinic Follow up Note   Patient Care Team: [name], MD as PCP - General (Internal Medicine) [name], MD as Consulting Physician (Oncology) [name], RN as Oncology Nurse Navigator 03/12/2021  CHIEF COMPLAINT: Follow-up metastatic colon cancer  SUMMARY OF ONCOLOGIC HISTORY: Oncology History Overview Note  Cancer Staging metastatic cecal cancer Staging form: Colon and Rectum, AJCC 8th Edition - Clinical stage from 11/29/2020: Stage IVC (cTX, cN2, pM1c) - Signed by [name], MD on 12/04/2020 Stage prefix: Initial diagnosis Histologic grade (G): G3 Histologic grading system: 4 grade system    metastatic cecal cancer  11/27/2020 Imaging   CT Angio CAP  IMPRESSION: 1. Thickening of the terminal ileum with associated proximal mid to distal small bowel obstruction. Findings could be due to an ileitis versus malignancy. Nonspecific mesenteric edema could be due to engorgement versus metastases. No associated bowel perforation. Recommend endoscopy for further evaluation. 2. Indeterminate right lower quadrant lymphadenopathy. 3. Scattered colonic diverticulosis with no acute diverticulitis. 4. Stable right hepatic lobe subcentimeter hyperdensity likely represents a hepatic hemangioma. 5. No acute vascular abnormality. Aortic Atherosclerosis (ICD10-I70.0) - mild. 6. No acute intrathoracic abnormality.   11/28/2020 Imaging   CT AP  IMPRESSION: 1. There is masslike, circumferential thickening of the terminal ileum and cecal base near the ileocecal valve and abnormally enlarged lymph nodes in the right lower quadrant mesentery adjacent to the terminal ileum measuring up to 2.3 x 1.4 cm. 2. There is extensive omental and peritoneal nodularity and caking throughout the abdomen. 3. Findings are highly concerning for primary colon malignancy with nodal and peritoneal metastatic  disease. 4. Small volume perihepatic and perisplenic ascites. 5. The small bowel is generally decompressed, with full some fluid-filled, nondistended loops throughout. There is transit of oral enteric contrast to the terminal ileum. No evidence of overt bowel obstruction at this time. Esophagogastric tube is position with tip and side port below the diaphragm. 6. Atelectasis or consolidation of the dependent bilateral lung bases, new compared to prior examination.   Aortic Atherosclerosis (ICD10-I70.0).     11/29/2020 Surgery   EXPLORATORY LAPAROTOMY, PARTIAL BOWEL RESECTION, POSSIBLE OSTOMY CREATION, PERITIONEAL BIOPSY, INSERTION OF GASTROSTOMY TUBE by Dr [name]   11/29/2020 Initial Biopsy   FINAL MICROSCOPIC DIAGNOSIS:   AB. 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    COMMENT:   Immunohistochemistry (IHC) for CK20 and CDX-2 is strong and diffusely  positive.  CK7, TTF-1, Synaptophysin, Chromogranin and CD56 are  negative.  The immunophenotype is compatible with origin from the lower  gastrointestinal tract.  IHC for MMR will be reported separately.  Case  preliminarily discussed with Dr. [name] on 12/02/2020.   At the request of Dr. [name], [phone] was reviewed in retrospect.  Review of the submitted sections confirms the presence of acute  appendicitis.  No malignancy is identified.    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 Wall thickening of the terminal ileum again seen. Fluid-filled distal small bowel, ascending, and transverse colon without obstruction. 2. Equivocal improvement in omental and peritoneal metastatic disease with slightly improved small volume perihepatic and perisplenic ascites. Right lower quadrant mesenteric adenopathy is similar or mildly improved. 3. Sigmoid colonic diverticulosis. Mild mural wall thickening in the sigmoid, no acute diverticulitis. 4. Gastrostomy tube in place, balloon normally positioned in the stomach. 5. Chronic bilateral L5 pars interarticularis defects with trace anterolisthesis of L5 on S1. Chronic avascular necrosis of the left femoral head.   01/16/2021 Genetic Testing   Negative genetic testing. CTNNA1 [id number] VUS, RAD51D p.G140E VUS and TSC1 p.R768H VUS found on the CancerNext-Expanded+RNAinsight.  The CancerNext-Expanded gene panel offered by [facility] and includes sequencing and rearrangement analysis for the following 77 genes: AIP, ALK, APC*, ATM*, AXIN2, BAP1, BARD1, BLM, BMPR1A, BRCA1*, BRCA2*, BRIP1*, CDC73, CDH1*, CDK4, CDKN1B, CDKN2A, CHEK2*, CTNNA1, DICER1, FANCC, FH, FLCN, GALNT12, KIF1B, LZTR1, MAX, MEN1, MET, MLH1*, MSH2*, MSH3, MSH6*, MUTYH*, NBN, NF1*, NF2, NTHL1, PALB2*, PHOX2B, PMS2*, POT1, PRKAR1A, PTCH1, PTEN*, RAD51C*, RAD51D*, RB1, RECQL, RET, SDHA, SDHAF2, SDHB, SDHC, SDHD, SMAD4, SMARCA4, SMARCB1, SMARCE1, STK11, SUFU, TMEM127, TP53*, TSC1, TSC2, VHL and XRCC2 (sequencing and deletion/duplication); EGFR, EGLN1, HOXB13, KIT, MITF, PDGFRA, POLD1, and POLE (sequencing only); EPCAM and GREM1 (deletion/duplication only). DNA and RNA analyses performed for * genes. The report date is Jan 16, 2021.   02/24/2021  Imaging   CT AP  IMPRESSION: 1. Interval resolution of previously seen small volume ascites throughout the abdomen and pelvis. There is redemonstrated, ill-defined stranding and thickening of the peritoneal surfaces and omentum, which is somewhat diminished compared to prior examination. 2. Interval improvement in right lower quadrant mesocolon lymph nodes. 3. Findings are consistent with treatment response of nodal and peritoneal metastatic disease. 4. Unchanged, matted appearing thickening of the terminal ileum and cecal base. 5. Sigmoid diverticulosis with wall thickening of the mid to distal sigmoid, similar to prior examination. Findings are most suggestive of chronic sequelae of diverticulitis.   Aortic Atherosclerosis (ICD10-I70.0).   03/13/2021 -  Chemotherapy    Patient is on Treatment Plan: PANCREAS MODIFIED FOLFIRINOX Q14D X 8 CYCLES   Patient is on Antibody Plan: COLORECTAL BEVACIZUMAB Q14D       CURRENT THERAPY: First line FOLFOX q2 weeks starting 12/18/20. Bevacizumab added with cycle 2. Added Irinotecan from C6 on 03/13/21.   INTERVAL HISTORY: Mr. Acebo returns for follow-up and treatment as scheduled.  Last chemo 02/13/2021 with FOLFOX and Beva.  The plan is to increase to FOLFIRINOX starting today   REVIEW OF SYSTEMS:   Constitutional: Denies fevers, chills or abnormal weight loss Eyes: Denies blurriness of vision Ears, nose, mouth, throat, and face: Denies mucositis or sore throat Respiratory: Denies cough, dyspnea or wheezes Cardiovascular: Denies palpitation, chest discomfort or lower extremity swelling Gastrointestinal:  Denies nausea, heartburn or change in bowel habits Skin: Denies abnormal skin rashes Lymphatics: Denies new lymphadenopathy or easy bruising Neurological:Denies numbness, tingling or new weaknesses Behavioral/Psych: Mood is stable, no new changes  All other systems were reviewed with the patient and are negative.  MEDICAL HISTORY:  Past  Medical History:  Diagnosis Date   Arthritis    Colon cancer ([place])  with metastasis   Family history of adverse reaction to anesthesia    mother had problem with it due to her asthma   Family history of breast cancer    Family history of prostate cancer     SURGICAL HISTORY: Past Surgical History:  Procedure Laterality Date   BOWEL RESECTION N/A 11/29/2020   Procedure: PARTIAL BOWEL RESECTION;  Surgeon: Jovita Kussmaul, MD;  Location: [place];  Service: General;  Laterality: N/A;   GASTROSTOMY Left 11/29/2020   Procedure: INSERTION OF GASTROSTOMY TUBE;  Surgeon: Jovita Kussmaul, MD;  Location: [place];  Service: General;  Laterality: Left;   IR IMAGING GUIDED PORT INSERTION  12/12/2020   LAPAROSCOPIC APPENDECTOMY N/A 01/17/2019   Procedure: APPENDECTOMY LAPAROSCOPIC;  Surgeon: Ralene Ok, MD;  Location: [facility];  Service: General;  Laterality: N/A;   LAPAROTOMY N/A 11/29/2020   Procedure: EXPLORATORY LAPAROTOMY;  Surgeon: Jovita Kussmaul, MD;  Location: [place];  Service: General;  Laterality: N/A;  PUT CASE IN ROOM 1 STARTING AT 9:30AM FOR 120 MIN   OSTOMY N/A 11/29/2020   Procedure: POSSIBLE OSTOMY CREATION;  Surgeon: Jovita Kussmaul, MD;  Location: [place];  Service: General;  Laterality: N/A;   RECTAL BIOPSY N/A 11/29/2020   Procedure: PERITIONEAL BIOPSY;  Surgeon: Jovita Kussmaul, MD;  Location: [place];  Service: General;  Laterality: N/A;   SHOULDER SURGERY Left 09/19/2015    I have reviewed the social history and family history with the patient and they are unchanged from previous note.  ALLERGIES:  has No Known Allergies.  MEDICATIONS:  Current Outpatient Medications  Medication Sig Dispense Refill   acetaminophen (TYLENOL) 500 MG tablet Take 1,000 mg by mouth every 6 (six) hours as needed for mild pain.     dicyclomine (BENTYL) 10 MG capsule Take 1 capsule (10 mg total) by mouth 4 (four) times daily -  before meals and at bedtime. 120 capsule 1   diphenoxylate-atropine (LOMOTIL)  2.5-0.025 MG tablet Take 2 tablets by mouth 4 (four) times daily as needed for diarrhea or loose stools. 60 tablet 1   oxyCODONE (OXY IR/ROXICODONE) 5 MG immediate release tablet Take 1 tablet (5 mg total) by mouth every 6 (six) hours as needed for severe pain. 60 tablet 0   prochlorperazine (COMPAZINE) 10 MG tablet Take 1 tablet (10 mg total) by mouth every 6 (six) hours as needed for nausea or vomiting. 30 tablet 2   zolpidem (AMBIEN) 5 MG tablet Take 1 tablet (5 mg total) by mouth at bedtime as needed for sleep. 30 tablet 0   Current Facility-Administered Medications  Medication Dose Route Frequency Provider Last Rate Last Admin   0.9 %  sodium chloride infusion   Intravenous PRN Causey, Charlestine Massed, NP        PHYSICAL EXAMINATION: ECOG PERFORMANCE STATUS: {CHL ONC ECOG ZO:[id number]}  There were no vitals filed for this visit. There were no vitals filed for this visit.  GENERAL:alert, no distress and comfortable SKIN: skin color, texture, turgor are normal, no rashes or significant lesions EYES: normal, Conjunctiva are pink and non-injected, sclera clear OROPHARYNX:no exudate, no erythema and lips, buccal mucosa, and tongue normal  NECK: supple, thyroid normal size, non-tender, without nodularity LYMPH:  no palpable lymphadenopathy in the cervical, axillary or inguinal LUNGS: clear to auscultation and percussion with normal breathing effort HEART: regular rate & rhythm and no murmurs and no lower extremity edema ABDOMEN:abdomen soft, non-tender and normal bowel sounds Musculoskeletal:no cyanosis of digits and no clubbing  NEURO: alert & oriented x 3 with fluent speech, no focal motor/sensory deficits  LABORATORY DATA:  I have reviewed the data as listed CBC Latest Ref Rng & Units 02/13/2021 02/04/2021 01/30/2021  WBC 4.0 - 10.5 K/uL 4.3 7.2 8.4  Hemoglobin 13.0 - 17.0 g/dL 13.0 13.6 13.0  Hematocrit 39.0 - 52.0 % 36.6(L) 39.4 36.9(L)  Platelets 150 - 400 K/uL 153 259 205      CMP Latest Ref Rng & Units 02/13/2021 02/04/2021 01/30/2021  Glucose 70 - 99 mg/dL 90 80 85  BUN 6 - 20 mg/dL '11 15 11  ' Creatinine 0.61 - 1.24 mg/dL 1.11 0.92 0.95  Sodium 135 - 145 mmol/L 138 136 140  Potassium 3.5 - 5.1 mmol/L 4.0 3.9 4.0  Chloride 98 - 111 mmol/L 106 105 106  CO2 22 - 32 mmol/L '22 23 25  ' Calcium 8.9 - 10.3 mg/dL 8.9 8.5(L) 9.0  Total Protein 6.5 - 8.1 g/dL 7.1 7.4 6.9  Total Bilirubin 0.3 - 1.2 mg/dL 0.5 0.8 0.5  Alkaline Phos 38 - 126 U/L 96 82 93  AST 15 - 41 U/L '16 21 19  ' ALT 0 - 44 U/L '22 27 24      ' RADIOGRAPHIC STUDIES: I have personally reviewed the radiological images as listed and agreed with the findings in the report. No results found.   ASSESSMENT & PLAN:  No problem-specific Assessment & Plan notes found for this encounter.   No orders of the defined types were placed in this encounter.  All questions were answered. The patient knows to call the clinic with any problems, questions or concerns. No barriers to learning was detected. I spent {CHL ONC TIME VISIT - NRJFK:[id number]} counseling the patient face to face. The total time spent in the appointment was {CHL ONC TIME VISIT - RALCM:[id number]} and more than 50% was on counseling and review of test results     Alla Feeling, NP 03/12/21

## 2021-03-13 ENCOUNTER — Encounter: Payer: Self-pay | Admitting: Hematology

## 2021-03-13 ENCOUNTER — Inpatient Hospital Stay: Payer: BC Managed Care – PPO | Admitting: Nutrition

## 2021-03-13 ENCOUNTER — Inpatient Hospital Stay: Payer: BC Managed Care – PPO | Admitting: Hematology

## 2021-03-13 ENCOUNTER — Other Ambulatory Visit: Payer: Self-pay

## 2021-03-13 ENCOUNTER — Inpatient Hospital Stay: Payer: BC Managed Care – PPO

## 2021-03-13 VITALS — BP 124/67 | HR 74 | Resp 16

## 2021-03-13 VITALS — BP 125/88 | HR 81 | Temp 98.4°F | Resp 20 | Ht 71.0 in | Wt 239.1 lb

## 2021-03-13 DIAGNOSIS — C182 Malignant neoplasm of ascending colon: Secondary | ICD-10-CM

## 2021-03-13 DIAGNOSIS — C772 Secondary and unspecified malignant neoplasm of intra-abdominal lymph nodes: Secondary | ICD-10-CM

## 2021-03-13 DIAGNOSIS — C786 Secondary malignant neoplasm of retroperitoneum and peritoneum: Secondary | ICD-10-CM | POA: Diagnosis not present

## 2021-03-13 DIAGNOSIS — Z95828 Presence of other vascular implants and grafts: Secondary | ICD-10-CM

## 2021-03-13 LAB — CBC WITH DIFFERENTIAL (CANCER CENTER ONLY)
Abs Immature Granulocytes: 0.03 10*3/uL (ref 0.00–0.07)
Basophils Absolute: 0 10*3/uL (ref 0.0–0.1)
Basophils Relative: 1 %
Eosinophils Absolute: 0.1 10*3/uL (ref 0.0–0.5)
Eosinophils Relative: 1 %
HCT: 41.5 % (ref 39.0–52.0)
Hemoglobin: 14.4 g/dL (ref 13.0–17.0)
Immature Granulocytes: 0 %
Lymphocytes Relative: 17 %
Lymphs Abs: 1.3 10*3/uL (ref 0.7–4.0)
MCH: 30.4 pg (ref 26.0–34.0)
MCHC: 34.7 g/dL (ref 30.0–36.0)
MCV: 87.7 fL (ref 80.0–100.0)
Monocytes Absolute: 0.9 10*3/uL (ref 0.1–1.0)
Monocytes Relative: 12 %
Neutro Abs: 5.5 10*3/uL (ref 1.7–7.7)
Neutrophils Relative %: 69 %
Platelet Count: 266 10*3/uL (ref 150–400)
RBC: 4.73 MIL/uL (ref 4.22–5.81)
RDW: 17.2 % — ABNORMAL HIGH (ref 11.5–15.5)
WBC Count: 7.9 10*3/uL (ref 4.0–10.5)
nRBC: 0 % (ref 0.0–0.2)

## 2021-03-13 LAB — CMP (CANCER CENTER ONLY)
ALT: 19 U/L (ref 0–44)
AST: 21 U/L (ref 15–41)
Albumin: 3.7 g/dL (ref 3.5–5.0)
Alkaline Phosphatase: 98 U/L (ref 38–126)
Anion gap: 8 (ref 5–15)
BUN: 17 mg/dL (ref 6–20)
CO2: 23 mmol/L (ref 22–32)
Calcium: 9.8 mg/dL (ref 8.9–10.3)
Chloride: 108 mmol/L (ref 98–111)
Creatinine: 1.07 mg/dL (ref 0.61–1.24)
GFR, Estimated: >60 mL/min (ref >60)
Glucose, Bld: 77 mg/dL (ref 70–99)
Potassium: 4.3 mmol/L (ref 3.5–5.1)
Sodium: 139 mmol/L (ref 135–145)
Total Bilirubin: 0.5 mg/dL (ref 0.3–1.2)
Total Protein: 8.1 g/dL (ref 6.5–8.1)

## 2021-03-13 MED ORDER — PALONOSETRON HCL INJECTION 0.25 MG/5ML
INTRAVENOUS | Status: AC
  Filled 2021-03-13: qty 5

## 2021-03-13 MED ORDER — SODIUM CHLORIDE 0.9 % IV SOLN
Freq: Once | INTRAVENOUS | Status: AC
  Filled 2021-03-13: qty 250

## 2021-03-13 MED ORDER — LORATADINE 10 MG PO TABS
10.0000 mg | ORAL_TABLET | Freq: Every day | ORAL | Status: DC
  Administered 2021-03-13: 10 mg via ORAL

## 2021-03-13 MED ORDER — DEXTROSE 5 % IV SOLN
Freq: Once | INTRAVENOUS | Status: AC
  Filled 2021-03-13: qty 250

## 2021-03-13 MED ORDER — SODIUM CHLORIDE 0.9 % IV SOLN
5.0000 mg/kg | Freq: Once | INTRAVENOUS | Status: AC
  Administered 2021-03-13: 500 mg via INTRAVENOUS
  Filled 2021-03-13: qty 16

## 2021-03-13 MED ORDER — ATROPINE SULFATE 1 MG/ML IJ SOLN
INTRAMUSCULAR | Status: AC
  Filled 2021-03-13: qty 1

## 2021-03-13 MED ORDER — SODIUM CHLORIDE 0.9% FLUSH
10.0000 mL | INTRAVENOUS | Status: DC | PRN
  Filled 2021-03-13: qty 10

## 2021-03-13 MED ORDER — SODIUM CHLORIDE 0.9 % IV SOLN
Freq: Once | INTRAVENOUS | Status: DC | PRN
  Filled 2021-03-13: qty 250

## 2021-03-13 MED ORDER — ATROPINE SULFATE 1 MG/ML IJ SOLN
0.5000 mg | Freq: Once | INTRAMUSCULAR | Status: AC | PRN
  Administered 2021-03-13: 0.5 mg via INTRAVENOUS

## 2021-03-13 MED ORDER — SODIUM CHLORIDE 0.9 % IV SOLN
150.0000 mg | Freq: Once | INTRAVENOUS | Status: AC
  Administered 2021-03-13: 150 mg via INTRAVENOUS
  Filled 2021-03-13: qty 150

## 2021-03-13 MED ORDER — METHYLPREDNISOLONE SODIUM SUCC 125 MG IJ SOLR
60.0000 mg | INTRAMUSCULAR | Status: AC
  Administered 2021-03-13: 60 mg via INTRAVENOUS

## 2021-03-13 MED ORDER — SODIUM CHLORIDE 0.9 % IV SOLN
10.0000 mg | Freq: Once | INTRAVENOUS | Status: AC
  Administered 2021-03-13: 10 mg via INTRAVENOUS
  Filled 2021-03-13: qty 10

## 2021-03-13 MED ORDER — FAMOTIDINE 20 MG IN NS 100 ML IVPB
20.0000 mg | Freq: Once | INTRAVENOUS | Status: AC | PRN
  Administered 2021-03-13: 20 mg via INTRAVENOUS

## 2021-03-13 MED ORDER — OXALIPLATIN CHEMO INJECTION 100 MG/20ML
85.0000 mg/m2 | Freq: Once | INTRAVENOUS | Status: AC
  Administered 2021-03-13: 200 mg via INTRAVENOUS
  Filled 2021-03-13: qty 40

## 2021-03-13 MED ORDER — SODIUM CHLORIDE 0.9 % IV SOLN
120.0000 mg/m2 | Freq: Once | INTRAVENOUS | Status: AC
  Administered 2021-03-13: 280 mg via INTRAVENOUS
  Filled 2021-03-13: qty 14

## 2021-03-13 MED ORDER — SODIUM CHLORIDE 0.9 % IV SOLN
2400.0000 mg/m2 | INTRAVENOUS | Status: DC
  Administered 2021-03-13: 5600 mg via INTRAVENOUS
  Filled 2021-03-13: qty 112

## 2021-03-13 MED ORDER — DIPHENHYDRAMINE HCL 50 MG/ML IJ SOLN
50.0000 mg | Freq: Once | INTRAMUSCULAR | Status: AC | PRN
  Administered 2021-03-13: 25 mg via INTRAVENOUS

## 2021-03-13 MED ORDER — DIPHENHYDRAMINE HCL 50 MG/ML IJ SOLN
INTRAMUSCULAR | Status: AC
  Filled 2021-03-13: qty 1

## 2021-03-13 MED ORDER — LEUCOVORIN CALCIUM INJECTION 350 MG
400.0000 mg/m2 | Freq: Once | INTRAVENOUS | Status: AC
  Administered 2021-03-13: 936 mg via INTRAVENOUS
  Filled 2021-03-13: qty 46.8

## 2021-03-13 MED ORDER — METHYLPREDNISOLONE SODIUM SUCC 125 MG IJ SOLR
INTRAMUSCULAR | Status: AC
  Filled 2021-03-13: qty 2

## 2021-03-13 MED ORDER — LORATADINE 10 MG PO TABS
ORAL_TABLET | ORAL | Status: AC
  Filled 2021-03-13: qty 1

## 2021-03-13 MED ORDER — DIPHENHYDRAMINE HCL 50 MG/ML IJ SOLN
25.0000 mg | INTRAMUSCULAR | Status: AC
  Administered 2021-03-13: 25 mg via INTRAVENOUS

## 2021-03-13 MED ORDER — PALONOSETRON HCL INJECTION 0.25 MG/5ML
0.2500 mg | Freq: Once | INTRAVENOUS | Status: AC
Start: 2021-03-13 — End: 2021-03-13
  Administered 2021-03-13: 0.25 mg via INTRAVENOUS

## 2021-03-13 MED ORDER — METHYLPREDNISOLONE SODIUM SUCC 125 MG IJ SOLR
125.0000 mg | Freq: Once | INTRAMUSCULAR | Status: AC | PRN
  Administered 2021-03-13: 60 mg via INTRAVENOUS

## 2021-03-13 MED ORDER — SODIUM CHLORIDE 0.9 % IV SOLN
INTRAVENOUS | Status: DC
  Filled 2021-03-13 (×2): qty 250

## 2021-03-13 MED ORDER — SODIUM CHLORIDE 0.9% FLUSH
10.0000 mL | Freq: Once | INTRAVENOUS | Status: AC
  Administered 2021-03-13: 10 mL
  Filled 2021-03-13: qty 10

## 2021-03-13 NOTE — Patient Instructions (Addendum)
[facility]  Discharge Instructions: Thank you for choosing [facility] to provide your oncology and hematology care.   If you have a lab appointment with the [facility], please go directly to the [facility] and check in at the registration area.   Wear comfortable clothing and clothing appropriate for easy access to any Portacath or PICC line.   We strive to give you quality time with your provider. You may need to reschedule your appointment if you arrive late (15 or more minutes).  Arriving late affects you and other patients whose appointments are after yours.  Also, if you miss three or more appointments without notifying the office, you may be dismissed from the clinic at the provider's discretion.      For prescription refill requests, have your pharmacy contact our office and allow 72 hours for refills to be completed.    Today you received the following chemotherapy and/or immunotherapy agents FOLFOXIRI + Bevacizumab-bvzr (Oxaliplatin,Irinotecan,Leucovorin, 5FU, Avastin), and Normal Saline Bolus.       To help prevent nausea and vomiting after your treatment, we encourage you to take your nausea medication as directed.  BELOW ARE SYMPTOMS THAT SHOULD BE REPORTED IMMEDIATELY: *FEVER GREATER THAN 100.4 F (38 C) OR HIGHER *CHILLS OR SWEATING *NAUSEA AND VOMITING THAT IS NOT CONTROLLED WITH YOUR NAUSEA MEDICATION *UNUSUAL SHORTNESS OF BREATH *UNUSUAL BRUISING OR BLEEDING *URINARY PROBLEMS (pain or burning when urinating, or frequent urination) *BOWEL PROBLEMS (unusual diarrhea, constipation, pain near the anus) TENDERNESS IN MOUTH AND THROAT WITH OR WITHOUT PRESENCE OF ULCERS (sore throat, sores in mouth, or a toothache) UNUSUAL RASH, SWELLING OR PAIN  UNUSUAL VAGINAL DISCHARGE OR ITCHING   Items with * indicate a potential emergency and should be followed up as soon as possible or go to the Emergency Department if any problems  should occur.  Please show the CHEMOTHERAPY ALERT CARD or IMMUNOTHERAPY ALERT CARD at check-in to the Emergency Department and triage nurse.  Should you have questions after your visit or need to cancel or reschedule your appointment, please contact [facility]  Dept: [phone]  and follow the prompts.  Office hours are 8:00 a.m. to 4:30 p.m. Monday - Friday. Please note that voicemails left after 4:00 p.m. may not be returned until the following business day.  We are closed weekends and major holidays. You have access to a nurse at all times for urgent questions. Please call the main number to the clinic Dept: [phone] and follow the prompts.   For any non-urgent questions, you may also contact your provider using MyChart. We now offer e-Visits for anyone 19 and older to request care online for non-urgent symptoms. For details visit [email or web address].   Also download the MyChart app! Go to the app store, search "MyChart", open the app, select [facility], and log in with your MyChart username and password.  Due to Covid, a mask is required upon entering the hospital/clinic. If you do not have a mask, one will be given to you upon arrival. For doctor visits, patients may have 1 support person aged 62 or older with them. For treatment visits, patients cannot have anyone with them due to current Covid guidelines and our immunocompromised population.   Irinotecan injection What is this medication? IRINOTECAN (ir in oh TEE kan ) is a chemotherapy drug. It is used to treatcolon and rectal cancer. This medicine may be used for other purposes; ask your health care provider orpharmacist  if you have questions. COMMON BRAND NAME(S): Camptosar What should I tell my care team before I take this medication? They need to know if you have any of these conditions: dehydration diarrhea infection (especially a virus infection such as chickenpox, cold sores, or  herpes) liver disease low blood counts, like low white cell, platelet, or red cell counts low levels of calcium, magnesium, or potassium in the blood recent or ongoing radiation therapy an unusual or allergic reaction to irinotecan, other medicines, foods, dyes, or preservatives pregnant or trying to get pregnant breast-feeding How should I use this medication? This drug is given as an infusion into a vein. It is administered in a hospitalor clinic by a specially trained health care professional. Talk to your pediatrician regarding the use of this medicine in children.Special care may be needed. Overdosage: If you think you have taken too much of this medicine contact apoison control center or emergency room at once. NOTE: This medicine is only for you. Do not share this medicine with others. What if I miss a dose? It is important not to miss your dose. Call your doctor or health careprofessional if you are unable to keep an appointment. What may interact with this medication? Do not take this medicine with any of the following medications: cobicistat itraconazole This medicine may interact with the following medications: antiviral medicines for HIV or AIDS certain antibiotics like rifampin or rifabutin certain medicines for fungal infections like ketoconazole, posaconazole, and voriconazole certain medicines for seizures like carbamazepine, phenobarbital, phenotoin clarithromycin gemfibrozil nefazodone [facility]. John's Wort This list may not describe all possible interactions. Give your health care provider a list of all the medicines, herbs, non-prescription drugs, or dietary supplements you use. Also tell them if you smoke, drink alcohol, or use illegaldrugs. Some items may interact with your medicine. What should I watch for while using this medication? Your condition will be monitored carefully while you are receiving this medicine. You will need important blood work done while you are  taking thismedicine. This drug may make you feel generally unwell. This is not uncommon, as chemotherapy can affect healthy cells as well as cancer cells. Report any side effects. Continue your course of treatment even though you feel ill unless yourdoctor tells you to stop. In some cases, you may be given additional medicines to help with side effects.Follow all directions for their use. You may get drowsy or dizzy. Do not drive, use machinery, or do anything that needs mental alertness until you know how this medicine affects you. Do not stand or sit up quickly, especially if you are an older patient. This reducesthe risk of dizzy or fainting spells. Call your health care professional for advice if you get a fever, chills, or sore throat, or other symptoms of a cold or flu. Do not treat yourself. This medicine decreases your body's ability to fight infections. Try to avoid beingaround people who are sick. Avoid taking products that contain aspirin, acetaminophen, ibuprofen, naproxen, or ketoprofen unless instructed by your doctor. These medicines may hide afever. This medicine may increase your risk to bruise or bleed. Call your doctor orhealth care professional if you notice any unusual bleeding. Be careful brushing and flossing your teeth or using a toothpick because you may get an infection or bleed more easily. If you have any dental work done,tell your dentist you are receiving this medicine. Do not become pregnant while taking this medicine or for 6 months after stopping it. Women should inform their health care  professional if they wish to become pregnant or think they might be pregnant. Men should not father a child while taking this medicine and for 3 months after stopping it. There is potential for serious side effects to an unborn child. Talk to your health careprofessional for more information. Do not breast-feed an infant while taking this medicine or for 7 days afterstopping it. This  medicine has caused ovarian failure in some women. This medicine may make it more difficult to get pregnant. Talk to your health care professional if [place] concerned about your fertility. This medicine has caused decreased sperm counts in some men. This may make it more difficult to father a child. Talk to your health care professional if [place] concerned about your fertility. What side effects may I notice from receiving this medication? Side effects that you should report to your doctor or health care professionalas soon as possible: allergic reactions like skin rash, itching or hives, swelling of the face, lips, or tongue chest pain diarrhea flushing, runny nose, sweating during infusion low blood counts - this medicine may decrease the number of white blood cells, red blood cells and platelets. You may be at increased risk for infections and bleeding. nausea, vomiting pain, swelling, warmth in the leg signs of decreased platelets or bleeding - bruising, pinpoint red spots on the skin, black, tarry stools, blood in the urine signs of infection - fever or chills, cough, sore throat, pain or difficulty passing urine signs of decreased red blood cells - unusually weak or tired, fainting spells, lightheadedness Side effects that usually do not require medical attention (report to yourdoctor or health care professional if they continue or are bothersome): constipation hair loss headache loss of appetite mouth sores stomach pain This list may not describe all possible side effects. Call your doctor for medical advice about side effects. You may report side effects to FDA at1-800-FDA-1088. Where should I keep my medication? This drug is given in a hospital or clinic and will not be stored at home. NOTE: This sheet is a summary. It may not cover all possible information. If you have questions about this medicine, talk to your doctor, pharmacist, orhealth care provider.  2022 Elsevier/Gold  Standard (2019-08-01 17:46:13)

## 2021-03-13 NOTE — Addendum Note (Signed)
Addended by: [name] on: 03/13/2021 09:47 AM   Modules accepted: Orders

## 2021-03-13 NOTE — Progress Notes (Signed)
During irinotecan infusion, patient began c/o abdominal cramping and "feeling hot and sweaty." Irinotecan infusion paused and atropine administered. Patient verbalized near-immediate relief and infusion resumed. After oxaliplatin initiated, patient began c/o hives and itching. Oxaliplatin stopped and hypersensitivity protocol initiated. Medicated as documented in [facility]. Dr. Burr Medico aware and agreed with plan of care. Advised to rechallenge when appropriate. Patient verbalized improvement in symptoms and infusion resumed. Patient later stated, "Something isn't right." Also, c/o itching and multiple hives noted on arms and scalp. Palmar erythema present. Infusion stopped. Additional medications given as documented in [facility]. Dr. Burr Medico came to infusion room to evaluate patient. Patient insistent on rechallenging oxaliplatin. Informed patient of potential consequences of resuming oxaliplatin including worsening reaction and/or cardiac arrest. Patient restated this information and verbalized he wishes to continue with medication in spite of risk. Oxaliplatin resumed at 50% of original rate. Patient tolerated well. Rate increased to 200 mL/hr. Care turned over to Demetrius Revel, RN. Per Dr. Burr Medico, will increase 5FU home infusion to accommodate 45 hour duration (5.6 mL/hr).

## 2021-03-13 NOTE — Progress Notes (Signed)
[place]   [phone] [phone]   Clinic Follow up Note   Patient Care Team: [name], MD as PCP - General (Internal Medicine) [name], MD as Consulting Physician (Oncology) [name], RN as Oncology Nurse Navigator  Date of Service:  03/13/2021  CHIEF COMPLAINT: f/u of metastatic colon cancer  SUMMARY OF ONCOLOGIC HISTORY: Oncology History Overview Note  Cancer Staging metastatic cecal cancer Staging form: Colon and Rectum, AJCC 8th Edition - Clinical stage from 11/29/2020: Stage IVC (cTX, cN2, pM1c) - Signed by [name], MD on 12/04/2020 Stage prefix: Initial diagnosis Histologic grade (G): G3 Histologic grading system: 4 grade system    metastatic cecal cancer  11/27/2020 Imaging   CT Angio CAP  IMPRESSION: 1. Thickening of the terminal ileum with associated proximal mid to distal small bowel obstruction. Findings could be due to an ileitis versus malignancy. Nonspecific mesenteric edema could be due to engorgement versus metastases. No associated bowel perforation. Recommend endoscopy for further evaluation. 2. Indeterminate right lower quadrant lymphadenopathy. 3. Scattered colonic diverticulosis with no acute diverticulitis. 4. Stable right hepatic lobe subcentimeter hyperdensity likely represents a hepatic hemangioma. 5. No acute vascular abnormality. Aortic Atherosclerosis (ICD10-I70.0) - mild. 6. No acute intrathoracic abnormality.   11/28/2020 Imaging   CT AP  IMPRESSION: 1. There is masslike, circumferential thickening of the terminal ileum and cecal base near the ileocecal valve and abnormally enlarged lymph nodes in the right lower quadrant mesentery adjacent to the terminal ileum measuring up to 2.3 x 1.4 cm. 2. There is extensive omental and peritoneal nodularity and caking throughout the abdomen. 3. Findings are highly concerning for primary colon malignancy with nodal and peritoneal  metastatic disease. 4. Small volume perihepatic and perisplenic ascites. 5. The small bowel is generally decompressed, with full some fluid-filled, nondistended loops throughout. There is transit of oral enteric contrast to the terminal ileum. No evidence of overt bowel obstruction at this time. Esophagogastric tube is position with tip and side port below the diaphragm. 6. Atelectasis or consolidation of the dependent bilateral lung bases, new compared to prior examination.   Aortic Atherosclerosis (ICD10-I70.0).     11/29/2020 Surgery   EXPLORATORY LAPAROTOMY, PARTIAL BOWEL RESECTION, POSSIBLE OSTOMY CREATION, PERITIONEAL BIOPSY, INSERTION OF GASTROSTOMY TUBE by Dr [name]   11/29/2020 Initial Biopsy   FINAL MICROSCOPIC DIAGNOSIS:   AB. 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    COMMENT:   Immunohistochemistry (IHC) for CK20 and CDX-2 is strong and diffusely  positive.  CK7, TTF-1, Synaptophysin, Chromogranin and CD56 are  negative.  The immunophenotype is compatible with origin from the lower  gastrointestinal tract.  IHC for MMR will be reported separately.  Case  preliminarily discussed with Dr. [name] on 12/02/2020.   At the request of Dr. [name], [phone] was reviewed in retrospect.  Review of the submitted sections confirms the presence of acute  appendicitis.  No malignancy is identified.    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 Wall thickening of the terminal ileum again seen. Fluid-filled distal small bowel, ascending, and transverse colon without obstruction. 2. Equivocal improvement in omental and peritoneal metastatic disease with slightly improved small volume perihepatic and perisplenic ascites. Right lower quadrant mesenteric adenopathy is similar or mildly improved. 3. Sigmoid colonic diverticulosis. Mild mural wall thickening in the sigmoid, no acute diverticulitis. 4. Gastrostomy tube in place, balloon normally positioned in the stomach. 5. Chronic bilateral L5 pars interarticularis defects with trace anterolisthesis of L5 on S1. Chronic avascular necrosis of the left femoral head.   01/16/2021 Genetic Testing   Negative genetic testing. CTNNA1 [id number] VUS, RAD51D p.G140E VUS and TSC1 p.R768H VUS found on the CancerNext-Expanded+RNAinsight.  The CancerNext-Expanded gene panel offered by [facility] and includes sequencing and rearrangement analysis for the following 77 genes: AIP, ALK, APC*, ATM*, AXIN2, BAP1, BARD1, BLM, BMPR1A, BRCA1*, BRCA2*, BRIP1*, CDC73, CDH1*, CDK4, CDKN1B, CDKN2A, CHEK2*, CTNNA1, DICER1, FANCC, FH, FLCN, GALNT12, KIF1B, LZTR1, MAX, MEN1, MET, MLH1*, MSH2*, MSH3, MSH6*, MUTYH*, NBN, NF1*, NF2, NTHL1, PALB2*, PHOX2B, PMS2*, POT1, PRKAR1A, PTCH1, PTEN*, RAD51C*, RAD51D*, RB1, RECQL, RET, SDHA, SDHAF2, SDHB, SDHC, SDHD, SMAD4, SMARCA4, SMARCB1, SMARCE1, STK11, SUFU, TMEM127, TP53*, TSC1, TSC2, VHL and XRCC2 (sequencing and deletion/duplication); EGFR, EGLN1, HOXB13, KIT, MITF, PDGFRA, POLD1, and POLE (sequencing only); EPCAM and GREM1 (deletion/duplication only). DNA and RNA analyses performed for * genes. The report date is Jan 16, 2021.   02/24/2021 Imaging   CT AP  IMPRESSION: 1. Interval resolution of previously seen small volume  ascites throughout the abdomen and pelvis. There is redemonstrated, ill-defined stranding and thickening of the peritoneal surfaces and omentum, which is somewhat diminished compared to prior examination. 2. Interval improvement in right lower quadrant mesocolon lymph nodes. 3. Findings are consistent with treatment response of nodal and peritoneal metastatic disease. 4. Unchanged, matted appearing thickening of the terminal ileum and cecal base. 5. Sigmoid diverticulosis with wall thickening of the mid to distal sigmoid, similar to prior examination. Findings are most suggestive of chronic sequelae of diverticulitis.   Aortic Atherosclerosis (ICD10-I70.0).   03/13/2021 -  Chemotherapy    Patient is on Treatment Plan: COLORECTAL FOLFOXIRI + BEVACIZUMAB Q14D          CURRENT THERAPY:  First line FOLFOX q2 weeks starting 12/18/20. Bevacizumab added with cycle 2. Added Irinotecan from C6 on 03/13/21.  INTERVAL HISTORY:  DEARL RUDDEN is here for a follow up of metastatic colon cancer. He was last seen by me on 03/03/21. He presents to the clinic alone. He notes he went to [place], but he was not satisfied with his visit. He is still interested in meeting with Dr. Clovis Riley, whom he hears good things about. He notes today he feels better than he has in a long time. He reports he was able to bike for 8 miles recently. He notes he is eating well and gaining weight. He continues to eat soft foods, including pasta, and chicken (no red meat).  All other systems were reviewed with the patient and are negative.  MEDICAL HISTORY:  Past Medical History:  Diagnosis Date   Arthritis    Colon cancer [facility])    with metastasis   Family history of adverse reaction to anesthesia    mother had problem with it due to her asthma  Family history of breast cancer    Family history of prostate cancer     SURGICAL HISTORY: Past Surgical History:  Procedure Laterality Date   BOWEL  RESECTION N/A 11/29/2020   Procedure: PARTIAL BOWEL RESECTION;  Surgeon: Jovita Kussmaul, MD;  Location: [place];  Service: General;  Laterality: N/A;   GASTROSTOMY Left 11/29/2020   Procedure: INSERTION OF GASTROSTOMY TUBE;  Surgeon: Jovita Kussmaul, MD;  Location: [place];  Service: General;  Laterality: Left;   IR IMAGING GUIDED PORT INSERTION  12/12/2020   LAPAROSCOPIC APPENDECTOMY N/A 01/17/2019   Procedure: APPENDECTOMY LAPAROSCOPIC;  Surgeon: Ralene Ok, MD;  Location: [place];  Service: General;  Laterality: N/A;   LAPAROTOMY N/A 11/29/2020   Procedure: EXPLORATORY LAPAROTOMY;  Surgeon: Jovita Kussmaul, MD;  Location: [place];  Service: General;  Laterality: N/A;  PUT CASE IN ROOM 1 STARTING AT 9:30AM FOR 120 MIN   OSTOMY N/A 11/29/2020   Procedure: POSSIBLE OSTOMY CREATION;  Surgeon: Jovita Kussmaul, MD;  Location: [place];  Service: General;  Laterality: N/A;   RECTAL BIOPSY N/A 11/29/2020   Procedure: PERITIONEAL BIOPSY;  Surgeon: Jovita Kussmaul, MD;  Location: [place];  Service: General;  Laterality: N/A;   SHOULDER SURGERY Left 09/19/2015    I have reviewed the social history and family history with the patient and they are unchanged from previous note.  ALLERGIES:  has No Known Allergies.  MEDICATIONS:  Current Outpatient Medications  Medication Sig Dispense Refill   acetaminophen (TYLENOL) 500 MG tablet Take 1,000 mg by mouth every 6 (six) hours as needed for mild pain.     dicyclomine (BENTYL) 10 MG capsule Take 1 capsule (10 mg total) by mouth 4 (four) times daily -  before meals and at bedtime. 120 capsule 1   diphenoxylate-atropine (LOMOTIL) 2.5-0.025 MG tablet Take 2 tablets by mouth 4 (four) times daily as needed for diarrhea or loose stools. 60 tablet 1   oxyCODONE (OXY IR/ROXICODONE) 5 MG immediate release tablet Take 1 tablet (5 mg total) by mouth every 6 (six) hours as needed for severe pain. 60 tablet 0   prochlorperazine (COMPAZINE) 10 MG tablet Take 1 tablet (10 mg total) by  mouth every 6 (six) hours as needed for nausea or vomiting. 30 tablet 2   zolpidem (AMBIEN) 5 MG tablet Take 1 tablet (5 mg total) by mouth at bedtime as needed for sleep. 30 tablet 0   Current Facility-Administered Medications  Medication Dose Route Frequency Provider Last Rate Last Admin   0.9 %  sodium chloride infusion   Intravenous PRN Causey, Charlestine Massed, NP        PHYSICAL EXAMINATION: ECOG PERFORMANCE STATUS: 0 - Asymptomatic  Vitals:   03/13/21 0827  BP: 125/88  Pulse: 81  Resp: 20  Temp: 98.4 F (36.9 C)  SpO2: 97%   Filed Weights   03/13/21 0827  Weight: 239 lb 1.6 oz (108.5 kg)    Due to COVID19 we will limit examination to appearance. Patient had no complaints.  GENERAL:alert, no distress and comfortable SKIN: skin color normal, no rashes or significant lesions EYES: normal, Conjunctiva are pink and non-injected, sclera clear  NEURO: alert & oriented x 3 with fluent speech  LABORATORY DATA:  I have reviewed the data as listed CBC Latest Ref Rng & Units 03/13/2021 02/13/2021 02/04/2021  WBC 4.0 - 10.5 K/uL 7.9 4.3 7.2  Hemoglobin 13.0 - 17.0 g/dL 14.4 13.0 13.6  Hematocrit 39.0 - 52.0 % 41.5 36.6(L) 39.4  Platelets 150 - 400 K/uL 266 153 259     CMP Latest Ref Rng & Units 03/13/2021 02/13/2021 02/04/2021  Glucose 70 - 99 mg/dL 77 90 80  BUN 6 - 20 mg/dL _0 Creatinine 0.61 - 1.24 mg/dL 1.07 1.11 0.92  Sodium 135 - 145 mmol/L 139 138 136  Potassium 3.5 - 5.1 mmol/L 4.3 4.0 3.9  Chloride 98 - 111 mmol/L 108 106 105  CO2 22 - 32 mmol/L _1 Calcium 8.9 - 10.3 mg/dL 9.8 8.9 8.5(L)  Total Protein 6.5 - 8.1 g/dL 8.1 7.1 7.4  Total Bilirubin 0.3 - 1.2 mg/dL 0.5 0.5 0.8  Alkaline Phos 38 - 126 U/L 98 96 82  AST 15 - 41 U/L _2 ALT 0 - 44 U/L _3 RADIOGRAPHIC STUDIES: I have personally reviewed the radiological images as listed and agreed with the findings in the report. No results found.   ASSESSMENT & PLAN:  Ricardo [name]  is a 55 y.o. male with   1. 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 Work up showed mass in the terminal ileum with extensive omental nodularity and LN involvement. His baseline tumor marker CA 19-9 and CEA were normal. 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 This confirms stage IV metastatic cancer from cecum -I started hom on first-line palliative chemo with FOLFOX on 12/18/20.  If he has excellent response to treatment we may refer him for HIPEC debulking surgery in the future. -FO shows KRAS/NRAS wildtype, but given his right side colon cancer, we do not use EGFR inhibitor in first line.  -Bevacizumab added with cycle 2. Experienced worsening abdominal pain, nausea, and vomiting the day after cycle 2, prompting an ED visit. NO recurrent symptoms since he changed back to soft and low residual diet -CT AP w/o contrast from 02/24/21 showed Interval resolution of previously seen small volume ascites, Interval improvement in right lower quadrant mesocolon lymph nodes, Findings are consistent with treatment response of nodal and peritoneal metastatic disease.  -He plans to meet with Dr. Clovis Riley at the end of July to consider surgery (HIPEC). -We are escalating his treatment to [facility] today, which is his 6th cycle of FOLFOX. I reviewed the side effects and discussed the management of AEs  -Labs reviewed, adequate to proceed with treatment today.   2. Symptom Management: Constipation, lower abdominal pain, abdominal bloating secondary #1 and partial bowl  obstruction, Herniated Rectum -For 8-9 weeks before hospitalization he was having increasing lower abdominal pain and constipation -He did have G-tube placed with surgery on 11/29/20. He has not needed to use this since starting chemo. -Since starting chemo 12/18/20 he started dicyclomine which helps tremendously.  -He experienced nausea, vomiting, and cramping day two of cycle 2, prompting ED visit. He has been back on a soft diet, which has helped. He is feeling well again -He also reports receiving IV fluids following pump removal has helped tremendously. -He has adjusted his diet, gained back weight, and continues to feel better. He has not needed to take any medications  for the last 5 weeks. -He will meet with our nutritionist today.   3. Genetic testing -He has brother with prostate cancer and sister with breast cancer in their 54s. -Genetics counseling and labs on 12/25/20, results negative.   4. Social Support -He is an avid cyclist. Last long distant riding was 06/2020 across [place]. He has finally been able to get back to biking (in 02/2021) since his diagnosis. -He is married with 1 adult son. His brother Jenny Reichmann is in town. He works as a Designer, television/film set at [employer]. He may take time off work based on how to tolerate chemotherapy. -He was previously offered [facility] resources including chaplin and SW if needed. He notes he has good family support    5. Covid-19+ -Positive on at-home test on 01/20/21 -Received bebtelovimab on 01/24/21 -has recovered completely      Plan: -Proceed with FOFOXFIRI and beva today with 1L IVF today and on day 3, Udenyca on day 3  -Lab, Flush, F/u and next cycle of chemo and beva in 2 weeks, with pegfilgramstin on day 3.  -Plan to meet with Dr. Clovis Riley in late July.   No problem-specific Assessment & Plan notes found for this encounter.   No orders of the defined types were placed in this encounter.  All questions were answered. The patient knows to  call the clinic with any problems, questions or concerns. No barriers to learning was detected. The total time spent in the appointment was 30 minutes.     [name], MD 03/13/2021   I, Wilburn Mylar, am acting as scribe for [name], MD.   I have reviewed the above documentation for accuracy and completeness, and I agree with the above.

## 2021-03-13 NOTE — Progress Notes (Signed)
Nutrition follow-up completed with patient and wife during infusion for metastatic colon cancer.  Patient status post partial bowel resection with colostomy on March 18. Weight increased and documented as 239 pounds June 30. Patient has requested more detailed information along with a meal plan. Reports he has been experimenting with different foods and has found some that do not agree with him. Reports he discussed sugar and cancer with MD today.  Wife would like patient to be more moderate with sugar consumption. Patient does have cold sensitivity. Tolerates Pasta, chicken and [place], cold cereal with milk and some fruit and vegetables.  Nutrition diagnosis: Food and nutrition related knowledge deficit improving.  Intervention: Encourage patient to continue smaller, more frequent meals with snacks. Educated on low fiber diet and stressed importance of no more than 10 g fiber daily.  Reviewed foods allowed and foods which should be limited.  Provided examples of snacks.  Reviewed sample menu.  Educated on label reading.  Many questions were answered and teach back method used.  Provided nutrition fact sheet.  Provided additional contact information.  Monitoring, evaluation, goals: Patient will tolerate low fiber diet for weight maintenance.  Next visit: To be scheduled as needed.  **Disclaimer: This note was dictated with voice recognition software. Similar sounding words can inadvertently be transcribed and this note may contain transcription errors which may not have been corrected upon publication of note.**

## 2021-03-15 ENCOUNTER — Inpatient Hospital Stay: Payer: BC Managed Care – PPO | Attending: Nurse Practitioner

## 2021-03-15 ENCOUNTER — Other Ambulatory Visit: Payer: Self-pay

## 2021-03-15 VITALS — BP 129/76 | HR 78 | Temp 99.4°F | Resp 20 | Ht 71.0 in

## 2021-03-15 DIAGNOSIS — Z79899 Other long term (current) drug therapy: Secondary | ICD-10-CM | POA: Insufficient documentation

## 2021-03-15 DIAGNOSIS — Z5111 Encounter for antineoplastic chemotherapy: Secondary | ICD-10-CM | POA: Diagnosis present

## 2021-03-15 DIAGNOSIS — C772 Secondary and unspecified malignant neoplasm of intra-abdominal lymph nodes: Secondary | ICD-10-CM | POA: Diagnosis not present

## 2021-03-15 DIAGNOSIS — C182 Malignant neoplasm of ascending colon: Secondary | ICD-10-CM | POA: Diagnosis not present

## 2021-03-15 DIAGNOSIS — Z931 Gastrostomy status: Secondary | ICD-10-CM | POA: Diagnosis not present

## 2021-03-15 MED ORDER — HEPARIN SOD (PORK) LOCK FLUSH 100 UNIT/ML IV SOLN
500.0000 [IU] | Freq: Once | INTRAVENOUS | Status: AC | PRN
Start: 2021-03-15 — End: 2021-03-15
  Administered 2021-03-15: 500 [IU]
  Filled 2021-03-15: qty 5

## 2021-03-15 MED ORDER — PEGFILGRASTIM-CBQV 6 MG/0.6ML ~~LOC~~ SOSY
6.0000 mg | PREFILLED_SYRINGE | Freq: Once | SUBCUTANEOUS | Status: AC
  Administered 2021-03-15: 6 mg via SUBCUTANEOUS

## 2021-03-15 MED ORDER — SODIUM CHLORIDE 0.9 % IV SOLN
INTRAVENOUS | Status: DC
  Filled 2021-03-15 (×2): qty 250

## 2021-03-15 MED ORDER — PEGFILGRASTIM-CBQV 6 MG/0.6ML ~~LOC~~ SOSY
PREFILLED_SYRINGE | SUBCUTANEOUS | Status: AC
  Filled 2021-03-15: qty 0.6

## 2021-03-15 MED ORDER — SODIUM CHLORIDE 0.9% FLUSH
10.0000 mL | INTRAVENOUS | Status: DC | PRN
  Administered 2021-03-15: 10 mL
  Filled 2021-03-15: qty 10

## 2021-03-15 NOTE — Patient Instructions (Signed)

## 2021-03-26 NOTE — Progress Notes (Addendum)
[place]   Telephone:([phone]) [phone] Fax:(336) [phone]   Clinic Follow up Note   Patient Care Team: [name], MD as PCP - General (Internal Medicine) [name], MD as Consulting Physician (Oncology) [name], RN (Inactive) as Oncology Nurse Navigator 03/27/2021  CHIEF COMPLAINT: Follow up metastatic colon cancer   SUMMARY OF ONCOLOGIC HISTORY: Oncology History Overview Note  Cancer Staging metastatic cecal cancer Staging form: Colon and Rectum, AJCC 8th Edition - Clinical stage from 11/29/2020: Stage IVC (cTX, cN2, pM1c) - Signed by [name], MD on 12/04/2020 Stage prefix: Initial diagnosis Histologic grade (G): G3 Histologic grading system: 4 grade system    metastatic cecal cancer  11/27/2020 Imaging   CT Angio CAP  IMPRESSION: 1. Thickening of the terminal ileum with associated proximal mid to distal small bowel obstruction. Findings could be due to an ileitis versus malignancy. Nonspecific mesenteric edema could be due to engorgement versus metastases. No associated bowel perforation. Recommend endoscopy for further evaluation. 2. Indeterminate right lower quadrant lymphadenopathy. 3. Scattered colonic diverticulosis with no acute diverticulitis. 4. Stable right hepatic lobe subcentimeter hyperdensity likely represents a hepatic hemangioma. 5. No acute vascular abnormality. Aortic Atherosclerosis (ICD10-I70.0) - mild. 6. No acute intrathoracic abnormality.   11/28/2020 Imaging   CT AP  IMPRESSION: 1. There is masslike, circumferential thickening of the terminal ileum and cecal base near the ileocecal valve and abnormally enlarged lymph nodes in the right lower quadrant mesentery adjacent to the terminal ileum measuring up to 2.3 x 1.4 cm. 2. There is extensive omental and peritoneal nodularity and caking throughout the abdomen. 3. Findings are highly concerning for primary colon malignancy with nodal and peritoneal metastatic  disease. 4. Small volume perihepatic and perisplenic ascites. 5. The small bowel is generally decompressed, with full some fluid-filled, nondistended loops throughout. There is transit of oral enteric contrast to the terminal ileum. No evidence of overt bowel obstruction at this time. Esophagogastric tube is position with tip and side port below the diaphragm. 6. Atelectasis or consolidation of the dependent bilateral lung bases, new compared to prior examination.   Aortic Atherosclerosis (ICD10-I70.0).     11/29/2020 Surgery   EXPLORATORY LAPAROTOMY, PARTIAL BOWEL RESECTION, POSSIBLE OSTOMY CREATION, PERITIONEAL BIOPSY, INSERTION OF GASTROSTOMY TUBE by Dr [name]   11/29/2020 Initial Biopsy   FINAL MICROSCOPIC DIAGNOSIS:   AB. 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    COMMENT:   Immunohistochemistry (IHC) for CK20 and CDX-2 is strong and diffusely  positive.  CK7, TTF-1, Synaptophysin, Chromogranin and CD56 are  negative.  The immunophenotype is compatible with origin from the lower  gastrointestinal tract.  IHC for MMR will be reported separately.  Case  preliminarily discussed with Dr. [name] on 12/02/2020.   At the request of Dr. [name], [phone] was reviewed in retrospect.  Review of the submitted sections confirms the presence of acute  appendicitis.  No malignancy is identified.    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 Wall thickening of the terminal ileum again seen. Fluid-filled distal small bowel, ascending, and transverse colon without obstruction. 2. Equivocal improvement in omental and peritoneal metastatic disease with slightly improved small volume perihepatic and perisplenic ascites. Right lower quadrant mesenteric adenopathy is similar or mildly improved. 3. Sigmoid colonic diverticulosis. Mild mural wall thickening in the sigmoid, no acute diverticulitis. 4. Gastrostomy tube in place, balloon normally positioned in the stomach. 5. Chronic bilateral L5 pars interarticularis defects with trace anterolisthesis of L5 on S1. Chronic avascular necrosis of the left femoral head.   01/16/2021 Genetic Testing   Negative genetic testing. CTNNA1 [id number] VUS, RAD51D p.G140E VUS and TSC1 p.R768H VUS found on the CancerNext-Expanded+RNAinsight.  The CancerNext-Expanded gene panel offered by [facility] and includes sequencing and rearrangement analysis for the following 77 genes: AIP, ALK, APC*, ATM*, AXIN2, BAP1, BARD1, BLM, BMPR1A, BRCA1*, BRCA2*, BRIP1*, CDC73, CDH1*, CDK4, CDKN1B, CDKN2A, CHEK2*, CTNNA1, DICER1, FANCC, FH, FLCN, GALNT12, KIF1B, LZTR1, MAX, MEN1, MET, MLH1*, MSH2*, MSH3, MSH6*, MUTYH*, NBN, NF1*, NF2, NTHL1, PALB2*, PHOX2B, PMS2*, POT1, PRKAR1A, PTCH1, PTEN*, RAD51C*, RAD51D*, RB1, RECQL, RET, SDHA, SDHAF2, SDHB, SDHC, SDHD, SMAD4, SMARCA4, SMARCB1, SMARCE1, STK11, SUFU, TMEM127, TP53*, TSC1, TSC2, VHL and XRCC2 (sequencing and deletion/duplication); EGFR, EGLN1, HOXB13, KIT, MITF, PDGFRA, POLD1, and POLE (sequencing only); EPCAM and GREM1 (deletion/duplication only). DNA and RNA analyses performed for * genes. The report date is Jan 16, 2021.   02/24/2021 Imaging   CT AP  IMPRESSION: 1. Interval resolution of previously seen small volume ascites throughout  the abdomen and pelvis. There is redemonstrated, ill-defined stranding and thickening of the peritoneal surfaces and omentum, which is somewhat diminished compared to prior examination. 2. Interval improvement in right lower quadrant mesocolon lymph nodes. 3. Findings are consistent with treatment response of nodal and peritoneal metastatic disease. 4. Unchanged, matted appearing thickening of the terminal ileum and cecal base. 5. Sigmoid diverticulosis with wall thickening of the mid to distal sigmoid, similar to prior examination. Findings are most suggestive of chronic sequelae of diverticulitis.   Aortic Atherosclerosis (ICD10-I70.0).   03/13/2021 -  Chemotherapy    Patient is on Treatment Plan: COLORECTAL FOLFOXIRI + BEVACIZUMAB Q14D         CURRENT THERAPY: First line FOLFOX q2 weeks starting 12/18/20, Bevacizumab added w C2. Added irinotecan from C6 03/13/21  INTERVAL HISTORY: Ricardo [name] returns for follow up and treatment as scheduled. He received first cycle FOLFOXIRI 03/13/21 (escalated from FOLFOX with C6).  He is fatigued at times but still feels the best he has in a long time.  He remains very active.  Eating and drinking well.  Main complaint is discomfort from hemorrhoids which bled with each BM for 3 days last week.  Stools are a mixture of formed and liquid.  Hemorrhoids have been a little better this week, Preparation H and sitz bath's are not very helpful.  Most relief is from a hot tub/shower after bowel movement.  Bentyl helps mild cramping.  He did not experience cold sensitivity. He is emotional today, slightly anxious about this cycle. Worried about last cycle's oxali infusion and side effect accumulation.   Otherwise denies other neuropathy, mucositis, nausea/vomiting, fever, chills, cough, chest pain, dyspnea.   MEDICAL HISTORY:  Past Medical History:  Diagnosis Date   Arthritis    Colon cancer ([place])  with metastasis   Family history of adverse reaction to  anesthesia    mother had problem with it due to her asthma   Family history of breast cancer    Family history of prostate cancer     SURGICAL HISTORY: Past Surgical History:  Procedure Laterality Date   BOWEL RESECTION N/A 11/29/2020   Procedure: PARTIAL BOWEL RESECTION;  Surgeon: Jovita Kussmaul, MD;  Location: [place];  Service: General;  Laterality: N/A;   GASTROSTOMY Left 11/29/2020   Procedure: INSERTION OF GASTROSTOMY TUBE;  Surgeon: Jovita Kussmaul, MD;  Location: [place];  Service: General;  Laterality: Left;   IR IMAGING GUIDED PORT INSERTION  12/12/2020   LAPAROSCOPIC APPENDECTOMY N/A 01/17/2019   Procedure: APPENDECTOMY LAPAROSCOPIC;  Surgeon: Ralene Ok, MD;  Location: [place];  Service: General;  Laterality: N/A;   LAPAROTOMY N/A 11/29/2020   Procedure: EXPLORATORY LAPAROTOMY;  Surgeon: Jovita Kussmaul, MD;  Location: [facility];  Service: General;  Laterality: N/A;  PUT CASE IN ROOM 1 STARTING AT 9:30AM FOR 120 MIN   OSTOMY N/A 11/29/2020   Procedure: POSSIBLE OSTOMY CREATION;  Surgeon: Jovita Kussmaul, MD;  Location: [place];  Service: General;  Laterality: N/A;   RECTAL BIOPSY N/A 11/29/2020   Procedure: PERITIONEAL BIOPSY;  Surgeon: Jovita Kussmaul, MD;  Location: [place];  Service: General;  Laterality: N/A;   SHOULDER SURGERY Left 09/19/2015    I have reviewed the social history and family history with the patient and they are unchanged from previous note.  ALLERGIES:  has No Known Allergies.  MEDICATIONS:  Current Outpatient Medications  Medication Sig Dispense Refill   hydrocortisone (ANUSOL-HC) 2.5 % rectal cream Place 1 application rectally 2 (two) times daily. 30 g 1   acetaminophen (TYLENOL) 500 MG tablet Take 1,000 mg by mouth every 6 (six) hours as needed for mild pain.     dicyclomine (BENTYL) 10 MG capsule Take 1 capsule (10 mg total) by mouth 4 (four) times daily -  before meals and at bedtime. 120 capsule 1   diphenoxylate-atropine (LOMOTIL) 2.5-0.025 MG tablet Take 2  tablets by mouth 4 (four) times daily as needed for diarrhea or loose stools. 60 tablet 1   oxyCODONE (OXY IR/ROXICODONE) 5 MG immediate release tablet Take 1 tablet (5 mg total) by mouth every 6 (six) hours as needed for severe pain. 60 tablet 0   prochlorperazine (COMPAZINE) 10 MG tablet Take 1 tablet (10 mg total) by mouth every 6 (six) hours as needed for nausea or vomiting. 30 tablet 2   zolpidem (AMBIEN) 5 MG tablet Take 1 tablet (5 mg total) by mouth at bedtime as needed for sleep. 30 tablet 0   Current Facility-Administered Medications  Medication Dose Route Frequency Provider Last Rate Last Admin   0.9 %  sodium chloride infusion   Intravenous PRN Causey, Charlestine Massed, NP       Facility-Administered Medications Ordered in Other Visits  Medication Dose Route Frequency Provider Last Rate Last Admin   0.9 %  sodium chloride infusion   Intravenous Continuous [name], MD 20 mL/hr at 03/27/21 1027 New Bag at 03/27/21 1027   atropine injection 0.5 mg  0.5 mg Intravenous Once PRN [name], MD       bevacizumab-bvzr (ZIRABEV) 500 mg in sodium chloride 0.9 % 100 mL chemo infusion  5 mg/kg (Treatment Plan Recorded) Intravenous Once [name], MD       dextrose 5 % solution   Intravenous Once [name],  MD       fluorouracil (ADRUCIL) 6,550 mg in sodium chloride 0.9 % 119 mL chemo infusion  2,800 mg/m2 (Treatment Plan Recorded) Intravenous 1 day or 1 dose [name], MD       irinotecan (CAMPTOSAR) 360 mg in sodium chloride 0.9 % 500 mL chemo infusion  150 mg/m2 (Treatment Plan Recorded) Intravenous Once [name], MD       leucovorin 936 mg in dextrose 5 % 250 mL infusion  400 mg/m2 (Treatment Plan Recorded) Intravenous Once [name], MD       loratadine (CLARITIN) tablet 10 mg  10 mg Oral Daily [name], MD   10 mg at 03/27/21 1005   oxaliplatin (ELOXATIN) 200 mg in dextrose 5 % 500 mL chemo infusion  85 mg/m2 (Treatment Plan Recorded) Intravenous Once [name], MD        PHYSICAL  EXAMINATION: ECOG PERFORMANCE STATUS: 1 - Symptomatic but completely ambulatory  Vitals:   03/27/21 0906  BP: 134/87  Pulse: 73  Resp: 18  Temp: 98.4 F (36.9 C)  SpO2: 99%   Filed Weights   03/27/21 0906  Weight: 245 lb 4.8 oz (111.3 kg)    GENERAL:alert, no distress and comfortable SKIN: No rash EYES: sclera clear LUNGS: clear with normal breathing effort HEART: regular rate & rhythm, no lower extremity edema NEURO: alert & oriented x 3 with fluent speech, no focal motor/sensory deficits PAC without erythema  LABORATORY DATA:  I have reviewed the data as listed CBC Latest Ref Rng & Units 03/27/2021 03/13/2021 02/13/2021  WBC 4.0 - 10.5 K/uL 8.5 7.9 4.3  Hemoglobin 13.0 - 17.0 g/dL 14.0 14.4 13.0  Hematocrit 39.0 - 52.0 % 39.4 41.5 36.6(L)  Platelets 150 - 400 K/uL 177 266 153     CMP Latest Ref Rng & Units 03/27/2021 03/13/2021 02/13/2021  Glucose 70 - 99 mg/dL 84 77 90  BUN 6 - 20 mg/dL '15 17 11  ' Creatinine 0.61 - 1.24 mg/dL 0.95 1.07 1.11  Sodium 135 - 145 mmol/L 137 139 138  Potassium 3.5 - 5.1 mmol/L 4.3 4.3 4.0  Chloride 98 - 111 mmol/L 105 108 106  CO2 22 - 32 mmol/L '23 23 22  ' Calcium 8.9 - 10.3 mg/dL 9.2 9.8 8.9  Total Protein 6.5 - 8.1 g/dL 7.6 8.1 7.1  Total Bilirubin 0.3 - 1.2 mg/dL 0.3 0.5 0.5  Alkaline Phos 38 - 126 U/L 123 98 96  AST 15 - 41 U/L '26 21 16  ' ALT 0 - 44 U/L '30 19 22      ' RADIOGRAPHIC STUDIES: I have personally reviewed the radiological images as listed and agreed with the findings in the report. No results found.   ASSESSMENT & PLAN: Ricardo [name] is a 55 y.o. Caucasian male with no significant medical history.      1. 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 Work up showed mass in the terminal ileum with extensive  omental nodularity and LN involvement. His baseline tumor marker CA 19-9 and CEA were normal. 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   This confirms stage IV metastatic cancer from  cecum -He began first-line palliative chemo with FOLFOX on 12/18/2020, tolerated very well -FO shows KRAS/NRAS wildtype, but given his right side colon cancer, we do not use EGFR inhibitor and instead would recommend bevacizumab.  Bevacizumab was added with cycle 2 -CTAP without contrast from 02/24/2021 showed interval resolution of the previously seen small volume ascites, and interval improvement in the right lower quadrant mesocolon lymph nodes, consistent with treatment response of nodal and peritoneal metastatic disease. -Treatment was escalated to FOLFOXIRI with cycle 6 FOLFOX on 03/13/2021, with dose reduced irinotecan and 5-FU -Tolerated well, plan to increase to 5FU 2800 mg/m2 and full dose irinotecan today -Consult with Dr. Clovis Riley for Hypaque on 04/09/2021   2. Symptom Management: Constipation, lower abdominal pain, abdominal bloating secondary #1 and partial bowl obstruction  -For 8-9 weeks before hospitalization he was having increasing lower abdominal pain and constipation. He did have G-tube placed with surgery on 11/29/20. He notes/drains with bag as needed. -Since starting chemo he has been able to come off most supportive meds including oxycodone, nausea meds, and Lomotil. -Dicyclomine helps cramping -IVF with pump disconnect on day 3 has helped   3. Genetic testing -He has brother with prostate cancer and sister with breast cancer in their 63s. He is eligible for genetic testing.  -Negative result   4. Social Support -He is an avid cyclist. Last long distant riding was 06/2020 across [place]. He has  not biked in the 8-9 weeks before cancer diagnosis due to symptoms. His weight heavily fluctuates with changes in his activity level, diet and biking. -He is married with 1 adult son. His brother Jenny Reichmann is in town. He works as a Designer, television/film set at [employer]. He may take time off work based on how to tolerate chemotherapy. -I offered [facility] resources including chaplin and SW if needed. He notes he has good family support   Disposition: Ricardo [name] appears stable.  He completed 1 cycle of FOLFOXIRI and continues bevacizumab.  He tolerated chemo escalation well overall, with mild fatigue and abdominal cramping.  His main complaint is bleeding hemorrhoids.  We reviewed symptom management, I prescribed Anusol cream. He is otherwise able to recover, function well, and remain active.  There is no clinical evidence of disease progression.  Labs reviewed, adequate to proceed with cycle 2 FOLFOXIRI (7th chemo cycle) and continue beva today as planned. Will increase irinotecan to full dose and 5FU to 2800 mg/m2.  Due to his oxaliplatin reaction last cycle (hives), we are adding additional premeds and longer oxali infusion time over 4 hours. GCSF and IVF on day 3.   He has a consult with Dr. Clovis Riley 04/09/21 to discuss HIPEC.   He will return for f/up and next cycle in 2 weeks.  The plan was reviewed with Dr. Burr Medico.  All questions were answered. The patient knows to call the clinic with any problems, questions or concerns. No barriers to learning were detected.     Alla Feeling, NP 03/27/21    Addendum: Patient developed allergic reaction to Oxaliplatin shortly after infusion began. Symptoms including flushing and hives were treated with hypersensitivity protocol. He recovered quickly. Dr. Burr Medico assessed the patient. We do not plan to re-challenge oxaliplatin.

## 2021-03-27 ENCOUNTER — Inpatient Hospital Stay: Payer: BC Managed Care – PPO

## 2021-03-27 ENCOUNTER — Inpatient Hospital Stay (HOSPITAL_BASED_OUTPATIENT_CLINIC_OR_DEPARTMENT_OTHER): Payer: BC Managed Care – PPO | Admitting: Nurse Practitioner

## 2021-03-27 ENCOUNTER — Other Ambulatory Visit: Payer: Self-pay

## 2021-03-27 ENCOUNTER — Encounter: Payer: Self-pay | Admitting: Nurse Practitioner

## 2021-03-27 VITALS — BP 134/87 | HR 73 | Temp 98.4°F | Resp 18 | Ht 71.0 in | Wt 245.3 lb

## 2021-03-27 DIAGNOSIS — C182 Malignant neoplasm of ascending colon: Secondary | ICD-10-CM

## 2021-03-27 DIAGNOSIS — C772 Secondary and unspecified malignant neoplasm of intra-abdominal lymph nodes: Secondary | ICD-10-CM | POA: Diagnosis not present

## 2021-03-27 DIAGNOSIS — Z95828 Presence of other vascular implants and grafts: Secondary | ICD-10-CM

## 2021-03-27 LAB — CBC WITH DIFFERENTIAL (CANCER CENTER ONLY)
Abs Immature Granulocytes: 0.07 10*3/uL (ref 0.00–0.07)
Basophils Absolute: 0 10*3/uL (ref 0.0–0.1)
Basophils Relative: 1 %
Eosinophils Absolute: 0.1 10*3/uL (ref 0.0–0.5)
Eosinophils Relative: 1 %
HCT: 39.4 % (ref 39.0–52.0)
Hemoglobin: 14 g/dL (ref 13.0–17.0)
Immature Granulocytes: 1 %
Lymphocytes Relative: 16 %
Lymphs Abs: 1.4 10*3/uL (ref 0.7–4.0)
MCH: 31.3 pg (ref 26.0–34.0)
MCHC: 35.5 g/dL (ref 30.0–36.0)
MCV: 87.9 fL (ref 80.0–100.0)
Monocytes Absolute: 0.7 10*3/uL (ref 0.1–1.0)
Monocytes Relative: 8 %
Neutro Abs: 6.3 10*3/uL (ref 1.7–7.7)
Neutrophils Relative %: 73 %
Platelet Count: 177 10*3/uL (ref 150–400)
RBC: 4.48 MIL/uL (ref 4.22–5.81)
RDW: 16.7 % — ABNORMAL HIGH (ref 11.5–15.5)
WBC Count: 8.5 10*3/uL (ref 4.0–10.5)
nRBC: 0 % (ref 0.0–0.2)

## 2021-03-27 LAB — CMP (CANCER CENTER ONLY)
ALT: 30 U/L (ref 0–44)
AST: 26 U/L (ref 15–41)
Albumin: 3.6 g/dL (ref 3.5–5.0)
Alkaline Phosphatase: 123 U/L (ref 38–126)
Anion gap: 9 (ref 5–15)
BUN: 15 mg/dL (ref 6–20)
CO2: 23 mmol/L (ref 22–32)
Calcium: 9.2 mg/dL (ref 8.9–10.3)
Chloride: 105 mmol/L (ref 98–111)
Creatinine: 0.95 mg/dL (ref 0.61–1.24)
GFR, Estimated: >60 mL/min (ref >60)
Glucose, Bld: 84 mg/dL (ref 70–99)
Potassium: 4.3 mmol/L (ref 3.5–5.1)
Sodium: 137 mmol/L (ref 135–145)
Total Bilirubin: 0.3 mg/dL (ref 0.3–1.2)
Total Protein: 7.6 g/dL (ref 6.5–8.1)

## 2021-03-27 MED ORDER — SODIUM CHLORIDE 0.9 % IV SOLN
Freq: Once | INTRAVENOUS | Status: AC
  Filled 2021-03-27: qty 250

## 2021-03-27 MED ORDER — DIPHENHYDRAMINE HCL 50 MG/ML IJ SOLN
50.0000 mg | Freq: Once | INTRAMUSCULAR | Status: AC
  Administered 2021-03-27: 50 mg via INTRAVENOUS

## 2021-03-27 MED ORDER — METHYLPREDNISOLONE SODIUM SUCC 125 MG IJ SOLR
INTRAMUSCULAR | Status: AC
  Filled 2021-03-27: qty 2

## 2021-03-27 MED ORDER — LORATADINE 10 MG PO TABS
10.0000 mg | ORAL_TABLET | Freq: Every day | ORAL | Status: DC
  Administered 2021-03-27: 10 mg via ORAL

## 2021-03-27 MED ORDER — FAMOTIDINE 20 MG IN NS 100 ML IVPB
INTRAVENOUS | Status: AC
  Filled 2021-03-27: qty 100

## 2021-03-27 MED ORDER — HYDROCORTISONE (PERIANAL) 2.5 % EX CREA: 1.0000 "application " | TOPICAL_CREAM | Freq: Two times a day (BID) | CUTANEOUS | 1 refills | Status: DC

## 2021-03-27 MED ORDER — SODIUM CHLORIDE 0.9 % IV SOLN
150.0000 mg | Freq: Once | INTRAVENOUS | Status: AC
  Administered 2021-03-27: 150 mg via INTRAVENOUS
  Filled 2021-03-27: qty 150

## 2021-03-27 MED ORDER — FAMOTIDINE 20 MG IN NS 100 ML IVPB
20.0000 mg | Freq: Once | INTRAVENOUS | Status: AC
  Administered 2021-03-27: 20 mg via INTRAVENOUS

## 2021-03-27 MED ORDER — LEUCOVORIN CALCIUM INJECTION 350 MG
400.0000 mg/m2 | Freq: Once | INTRAVENOUS | Status: AC
  Administered 2021-03-27: 936 mg via INTRAVENOUS
  Filled 2021-03-27: qty 46.8

## 2021-03-27 MED ORDER — SODIUM CHLORIDE 0.9% FLUSH
10.0000 mL | Freq: Once | INTRAVENOUS | Status: AC
Start: 2021-03-27 — End: 2021-03-27
  Administered 2021-03-27: 10 mL
  Filled 2021-03-27: qty 10

## 2021-03-27 MED ORDER — SODIUM CHLORIDE 0.9 % IV SOLN
INTRAVENOUS | Status: DC
  Filled 2021-03-27: qty 250

## 2021-03-27 MED ORDER — DEXTROSE 5 % IV SOLN
Freq: Once | INTRAVENOUS | Status: AC
Start: 2021-03-27 — End: 2021-03-27
  Filled 2021-03-27: qty 250

## 2021-03-27 MED ORDER — SODIUM CHLORIDE 0.9 % IV SOLN
150.0000 mg/m2 | Freq: Once | INTRAVENOUS | Status: AC
  Administered 2021-03-27: 360 mg via INTRAVENOUS
  Filled 2021-03-27: qty 18

## 2021-03-27 MED ORDER — ATROPINE SULFATE 1 MG/ML IJ SOLN
0.5000 mg | Freq: Once | INTRAMUSCULAR | Status: AC | PRN
  Administered 2021-03-27: 0.5 mg via INTRAVENOUS

## 2021-03-27 MED ORDER — SODIUM CHLORIDE 0.9 % IV SOLN
2800.0000 mg/m2 | INTRAVENOUS | Status: DC
  Administered 2021-03-27: 6550 mg via INTRAVENOUS
  Filled 2021-03-27: qty 131

## 2021-03-27 MED ORDER — SODIUM CHLORIDE 0.9 % IV SOLN
5.0000 mg/kg | Freq: Once | INTRAVENOUS | Status: AC
  Administered 2021-03-27: 500 mg via INTRAVENOUS
  Filled 2021-03-27: qty 4

## 2021-03-27 MED ORDER — PALONOSETRON HCL INJECTION 0.25 MG/5ML
INTRAVENOUS | Status: AC
  Filled 2021-03-27: qty 5

## 2021-03-27 MED ORDER — METHYLPREDNISOLONE SODIUM SUCC 125 MG IJ SOLR
125.0000 mg | Freq: Once | INTRAMUSCULAR | Status: AC
  Administered 2021-03-27: 125 mg via INTRAVENOUS

## 2021-03-27 MED ORDER — LORATADINE 10 MG PO TABS
ORAL_TABLET | ORAL | Status: AC
  Filled 2021-03-27: qty 1

## 2021-03-27 MED ORDER — DICYCLOMINE HCL 10 MG PO CAPS: 10.0000 mg | ORAL_CAPSULE | Freq: Three times a day (TID) | ORAL | 1 refills | Status: DC

## 2021-03-27 MED ORDER — PALONOSETRON HCL INJECTION 0.25 MG/5ML
0.2500 mg | Freq: Once | INTRAVENOUS | Status: AC
  Administered 2021-03-27: 0.25 mg via INTRAVENOUS

## 2021-03-27 MED ORDER — DIPHENHYDRAMINE HCL 50 MG/ML IJ SOLN
INTRAMUSCULAR | Status: AC
  Filled 2021-03-27: qty 1

## 2021-03-27 MED ORDER — OXALIPLATIN CHEMO INJECTION 100 MG/20ML
85.0000 mg/m2 | Freq: Once | INTRAVENOUS | Status: AC
  Administered 2021-03-27: 200 mg via INTRAVENOUS
  Filled 2021-03-27: qty 40

## 2021-03-27 MED ORDER — ATROPINE SULFATE 1 MG/ML IJ SOLN
INTRAMUSCULAR | Status: AC
  Filled 2021-03-27: qty 1

## 2021-03-27 NOTE — Patient Instructions (Signed)
[facility]  Discharge Instructions: Thank you for choosing [facility] to provide your oncology and hematology care.   If you have a lab appointment with the [facility], please go directly to the [facility] and check in at the registration area.   Wear comfortable clothing and clothing appropriate for easy access to any Portacath or PICC line.   We strive to give you quality time with your provider. You may need to reschedule your appointment if you arrive late (15 or more minutes).  Arriving late affects you and other patients whose appointments are after yours.  Also, if you miss three or more appointments without notifying the office, you may be dismissed from the clinic at the provider's discretion.      For prescription refill requests, have your pharmacy contact our office and allow 72 hours for refills to be completed.    Today you received the following chemotherapy and/or immunotherapy agents : Folfirinox   To help prevent nausea and vomiting after your treatment, we encourage you to take your nausea medication as directed.  BELOW ARE SYMPTOMS THAT SHOULD BE REPORTED IMMEDIATELY: *FEVER GREATER THAN 100.4 F (38 C) OR HIGHER *CHILLS OR SWEATING *NAUSEA AND VOMITING THAT IS NOT CONTROLLED WITH YOUR NAUSEA MEDICATION *UNUSUAL SHORTNESS OF BREATH *UNUSUAL BRUISING OR BLEEDING *URINARY PROBLEMS (pain or burning when urinating, or frequent urination) *BOWEL PROBLEMS (unusual diarrhea, constipation, pain near the anus) TENDERNESS IN MOUTH AND THROAT WITH OR WITHOUT PRESENCE OF ULCERS (sore throat, sores in mouth, or a toothache) UNUSUAL RASH, SWELLING OR PAIN  UNUSUAL VAGINAL DISCHARGE OR ITCHING   Items with * indicate a potential emergency and should be followed up as soon as possible or go to the Emergency Department if any problems should occur.  Please show the CHEMOTHERAPY ALERT CARD or IMMUNOTHERAPY ALERT CARD at check-in to  the Emergency Department and triage nurse.  Should you have questions after your visit or need to cancel or reschedule your appointment, please contact [facility]  Dept: [phone]  and follow the prompts.  Office hours are 8:00 a.m. to 4:30 p.m. Monday - Friday. Please note that voicemails left after 4:00 p.m. may not be returned until the following business day.  We are closed weekends and major holidays. You have access to a nurse at all times for urgent questions. Please call the main number to the clinic Dept: [phone] and follow the prompts.   For any non-urgent questions, you may also contact your provider using MyChart. We now offer e-Visits for anyone 47 and older to request care online for non-urgent symptoms. For details visit [email or web address].   Also download the MyChart app! Go to the app store, search "MyChart", open the app, select [facility], and log in with your MyChart username and password.  Due to Covid, a mask is required upon entering the hospital/clinic. If you do not have a mask, one will be given to you upon arrival. For doctor visits, patients may have 1 support person aged 75 or older with them. For treatment visits, patients cannot have anyone with them due to current Covid guidelines and our immunocompromised population.

## 2021-03-27 NOTE — Progress Notes (Signed)
Almost 15 minutes into Oxaliplatin infusion, patient started to complain of feeling warm and states to feeling like the last time he had a reaction. Infusion immediately stopped. Vitals checked bp 136/75, pulse 71, 02 sats @ 99 on RA. Dr. Burr Medico came to assess patient decision made to d/c oxaliplatin and continue with Leucovorin only. No additional orders received Patient completed Avastin and Camptosar prior to starting Oxaliplatin without any issues. Patient states some relief in symptoms, still has small hives present on his arms.    1518- symptoms have completely resolved, hives previously present on his arms have disappeared. Leucovorin continues to infuse without any issues.

## 2021-03-29 ENCOUNTER — Other Ambulatory Visit: Payer: Self-pay

## 2021-03-29 ENCOUNTER — Inpatient Hospital Stay: Payer: BC Managed Care – PPO

## 2021-03-29 VITALS — BP 139/83 | HR 88 | Temp 97.8°F | Resp 20

## 2021-03-29 DIAGNOSIS — C772 Secondary and unspecified malignant neoplasm of intra-abdominal lymph nodes: Secondary | ICD-10-CM

## 2021-03-29 DIAGNOSIS — C182 Malignant neoplasm of ascending colon: Secondary | ICD-10-CM | POA: Diagnosis not present

## 2021-03-29 MED ORDER — SODIUM CHLORIDE 0.9 % IV SOLN
INTRAVENOUS | Status: DC
  Filled 2021-03-29 (×2): qty 250

## 2021-03-29 MED ORDER — SODIUM CHLORIDE 0.9% FLUSH
10.0000 mL | INTRAVENOUS | Status: DC | PRN
  Administered 2021-03-29: 10 mL
  Filled 2021-03-29: qty 10

## 2021-03-29 MED ORDER — PEGFILGRASTIM-CBQV 6 MG/0.6ML ~~LOC~~ SOSY
6.0000 mg | PREFILLED_SYRINGE | Freq: Once | SUBCUTANEOUS | Status: AC
  Administered 2021-03-29: 6 mg via SUBCUTANEOUS

## 2021-03-29 MED ORDER — HEPARIN SOD (PORK) LOCK FLUSH 100 UNIT/ML IV SOLN
250.0000 [IU] | Freq: Once | INTRAVENOUS | Status: AC | PRN
  Administered 2021-03-29: 250 [IU]
  Filled 2021-03-29: qty 5

## 2021-03-29 NOTE — Patient Instructions (Addendum)
Rehydration, Adult Rehydration is the replacement of body fluids, salts, and minerals (electrolytes) that are lost during dehydration. Dehydration is when there is not enough water or other fluids in the body. This happens when you lose more fluids than you take in. Common causes of dehydration include: Not drinking enough fluids. This can occur when you are ill or doing activities that require a lot of energy, especially in hot weather. Conditions that cause loss of water or other fluids, such as diarrhea, vomiting, sweating, or urinating a lot. Other illnesses, such as fever or infection. Certain medicines, such as those that remove excess fluid from the body (diuretics). Symptoms of mild or moderate dehydration may include thirst, dry lips and mouth, and dizziness. Symptoms of severe dehydration may include increasedheart rate, confusion, fainting, and not urinating. For severe dehydration, you may need to get fluids through an IV at the hospital. For mild or moderate dehydration, you can usually rehydrate at homeby drinking certain fluids as told by your health care provider. What are the risks? Generally, rehydration is safe. However, taking in too much fluid (overhydration) can be a problem. This is rare. Overhydration can cause an electrolyte imbalance, kidney failure, or a decrease in salt (sodium) levels in the body. Supplies needed You will need an oral rehydration solution (ORS) if your health care provider tells you to use one. This is a drink to treat dehydration. It can be found inpharmacies and retail stores. How to rehydrate Fluids Follow instructions from your health care provider for rehydration. The kind of fluid and the amount you should drink depend on your condition. In general, you should choose drinks that you prefer. If told by your health care provider, drink an ORS. Make an ORS by following instructions on the package. Start by drinking small amounts, about  cup (120 mL)  every 5-10 minutes. Slowly increase how much you drink until you have taken the amount recommended by your health care provider. Drink enough clear fluids to keep your urine pale yellow. If you were told to drink an ORS, finish it first, then start slowly drinking other clear fluids. Drink fluids such as: Water. This includes sparkling water and flavored water. Drinking only water can lead to having too little sodium in your body (hyponatremia). Follow the advice of your health care provider. Water from ice chips you suck on. Fruit juice with water you add to it (diluted). Sports drinks. Hot or cold herbal teas. Broth-based soups. Milk or milk products. Food Follow instructions from your health care provider about what to eat while you rehydrate. Your health care provider may recommend that you slowly begin eating regular foods in small amounts. Eat foods that contain a healthy balance of electrolytes, such as bananas, oranges, potatoes, tomatoes, and spinach. Avoid foods that are greasy or contain a lot of sugar. In some cases, you may get nutrition through a feeding tube that is passed through your nose and into your stomach (nasogastric tube, or NG tube). This may be done if you have uncontrolled vomiting or diarrhea. Beverages to avoid  Certain beverages may make dehydration worse. While you rehydrate, avoiddrinking alcohol. How to tell if you are recovering from dehydration You may be recovering from dehydration if: You are urinating more often than before you started rehydrating. Your urine is pale yellow. Your energy level improves. You vomit less frequently. You have diarrhea less frequently. Your appetite improves or returns to normal. You feel less dizzy or less light-headed. Your skin tone and  color start to look more normal. Follow these instructions at home: Take over-the-counter and prescription medicines only as told by your health care provider. Do not take sodium  tablets. Doing this can lead to having too much sodium in your body (hypernatremia). Contact a health care provider if: You continue to have symptoms of mild or moderate dehydration, such as: Thirst. Dry lips. Slightly dry mouth. Dizziness. Dark urine or less urine than normal. Muscle cramps. You continue to vomit or have diarrhea. Get help right away if you: Have symptoms of dehydration that get worse. Have a fever. Have a severe headache. Have been vomiting and the following happens: Your vomiting gets worse or does not go away. Your vomit includes blood or green matter (bile). You cannot eat or drink without vomiting. Have problems with urination or bowel movements, such as: Diarrhea that gets worse or does not go away. Blood in your stool (feces). This may cause stool to look black and tarry. Not urinating, or urinating only a small amount of very dark urine, within 6-8 hours. Have trouble breathing. Have symptoms that get worse with treatment. These symptoms may represent a serious problem that is an emergency. Do not wait to see if the symptoms will go away. Get medical help right away. Call your local emergency services (911 in the [place] Do not drive yourself to the hospital. Summary Rehydration is the replacement of body fluids and minerals (electrolytes) that are lost during dehydration. Follow instructions from your health care provider for rehydration. The kind of fluid and amount you should drink depend on your condition. Slowly increase how much you drink until you have taken the amount recommended by your health care provider. Contact your health care provider if you continue to show signs of mild or moderate dehydration. This information is not intended to replace advice given to you by your health care provider. Make sure you discuss any questions you have with your healthcare provider. Document Revised: 11/01/2019 Document Reviewed: 09/11/2019 Elsevier Patient  Education  2022 [facility]OG[facility]  Discharge Instructions: Thank you for choosing [facility] to provide your oncology and hematology care.   If you have a lab appointment with the [facility], please go directly to the [facility] and check in at the registration area.   Wear comfortable clothing and clothing appropriate for easy access to any Portacath or PICC line.   We strive to give you quality time with your provider. You may need to reschedule your appointment if you arrive late (15 or more minutes).  Arriving late affects you and other patients whose appointments are after yours.  Also, if you miss three or more appointments without notifying the office, you may be dismissed from the clinic at the provider's discretion.      For prescription refill requests, have your pharmacy contact our office and allow 72 hours for refills to be completed.    Today you received the following chemotherapy and/or immunotherapy agents Udenyca Injection      To help prevent nausea and vomiting after your treatment, we encourage you to take your nausea medication as directed.  BELOW ARE SYMPTOMS THAT SHOULD BE REPORTED IMMEDIATELY: *FEVER GREATER THAN 100.4 F (38 C) OR HIGHER *CHILLS OR SWEATING *NAUSEA AND VOMITING THAT IS NOT CONTROLLED WITH YOUR NAUSEA MEDICATION *UNUSUAL SHORTNESS OF BREATH *UNUSUAL BRUISING OR BLEEDING *URINARY PROBLEMS (pain or burning when urinating, or frequent urination) *BOWEL PROBLEMS (unusual diarrhea, constipation, pain near  the anus) TENDERNESS IN MOUTH AND THROAT WITH OR WITHOUT PRESENCE OF ULCERS (sore throat, sores in mouth, or a toothache) UNUSUAL RASH, SWELLING OR PAIN  UNUSUAL VAGINAL DISCHARGE OR ITCHING   Items with * indicate a potential emergency and should be followed up as soon as possible or go to the Emergency Department if any problems should occur.  Please show the CHEMOTHERAPY ALERT CARD or  IMMUNOTHERAPY ALERT CARD at check-in to the Emergency Department and triage nurse.  Should you have questions after your visit or need to cancel or reschedule your appointment, please contact [facility]  Dept: [phone]  and follow the prompts.  Office hours are 8:00 a.m. to 4:30 p.m. Monday - Friday. Please note that voicemails left after 4:00 p.m. may not be returned until the following business day.  We are closed weekends and major holidays. You have access to a nurse at all times for urgent questions. Please call the main number to the clinic Dept: [phone] and follow the prompts.   For any non-urgent questions, you may also contact your provider using MyChart. We now offer e-Visits for anyone 18 and older to request care online for non-urgent symptoms. For details visit [email or web address].   Also download the MyChart app! Go to the app store, search "MyChart", open the app, select [facility], and log in with your MyChart username and password.  Due to Covid, a mask is required upon entering the hospital/clinic. If you do not have a mask, one will be given to you upon arrival. For doctor visits, patients may have 1 support person aged 73 or older with them. For treatment visits, patients cannot have anyone with them due to current Covid guidelines and our immunocompromised population.

## 2021-03-29 NOTE — Progress Notes (Signed)
Patient noted to have had issues over the past 46 hours with his pump malfunctioning. Dr. Burr Medico called and made aware of this situation and the excess chemotherapy remaining in the bag. Per Dr. Burr Medico - okay to double rate of 5FU pump infusion.

## 2021-04-07 MED FILL — Fosaprepitant Dimeglumine For IV Infusion 150 MG (Base Eq): INTRAVENOUS | Qty: 5 | Status: AC

## 2021-04-07 NOTE — Progress Notes (Deleted)
[facility]   [phone] [phone]   Clinic Follow up Note   Patient Care Team: Sagardia, Miguel Jose, MD as PCP - General (Internal Medicine) Feng, Yan, MD as Consulting Physician (Oncology) [name], Cheryl L, RN (Inactive) as Oncology Nurse Navigator 04/07/2021  CHIEF COMPLAINT: Follow-up metastatic colon cancer  SUMMARY OF ONCOLOGIC HISTORY: Oncology History Overview Note  Cancer Staging metastatic cecal cancer Staging form: Colon and Rectum, AJCC 8th Edition - Clinical stage from 11/29/2020: Stage IVC (cTX, cN2, pM1c) - Signed by Feng, Yan, MD on 12/04/2020 Stage prefix: Initial diagnosis Histologic grade (G): G3 Histologic grading system: 4 grade system    metastatic cecal cancer  11/27/2020 Imaging   CT Angio CAP  IMPRESSION: 1. Thickening of the terminal ileum with associated proximal mid to distal small bowel obstruction. Findings could be due to an ileitis versus malignancy. Nonspecific mesenteric edema could be due to engorgement versus metastases. No associated bowel perforation. Recommend endoscopy for further evaluation. 2. Indeterminate right lower quadrant lymphadenopathy. 3. Scattered colonic diverticulosis with no acute diverticulitis. 4. Stable right hepatic lobe subcentimeter hyperdensity likely represents a hepatic hemangioma. 5. No acute vascular abnormality. Aortic Atherosclerosis (ICD10-I70.0) - mild. 6. No acute intrathoracic abnormality.   11/28/2020 Imaging   CT AP  IMPRESSION: 1. There is masslike, circumferential thickening of the terminal ileum and cecal base near the ileocecal valve and abnormally enlarged lymph nodes in the right lower quadrant mesentery adjacent to the terminal ileum measuring up to 2.3 x 1.4 cm. 2. There is extensive omental and peritoneal nodularity and caking throughout the abdomen. 3. Findings are highly concerning for primary colon malignancy with nodal and peritoneal metastatic  disease. 4. Small volume perihepatic and perisplenic ascites. 5. The small bowel is generally decompressed, with full some fluid-filled, nondistended loops throughout. There is transit of oral enteric contrast to the terminal ileum. No evidence of overt bowel obstruction at this time. Esophagogastric tube is position with tip and side port below the diaphragm. 6. Atelectasis or consolidation of the dependent bilateral lung bases, new compared to prior examination.   Aortic Atherosclerosis (ICD10-I70.0).     11/29/2020 Surgery   EXPLORATORY LAPAROTOMY, PARTIAL BOWEL RESECTION, POSSIBLE OSTOMY CREATION, PERITIONEAL BIOPSY, INSERTION OF GASTROSTOMY TUBE by Dr Toth   11/29/2020 Initial Biopsy   FINAL MICROSCOPIC DIAGNOSIS:   AB. 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    COMMENT:   Immunohistochemistry (IHC) for CK20 and CDX-2 is strong and diffusely  positive.  CK7, TTF-1, Synaptophysin, Chromogranin and CD56 are  negative.  The immunophenotype is compatible with origin from the lower  gastrointestinal tract.  IHC for MMR will be reported separately.  Case  preliminarily discussed with Dr. Gustav Magrinat on 12/02/2020.   At the request of Dr. Magrinat, [id number] was reviewed in retrospect.  Review of the submitted sections confirms the presence of acute  appendicitis.  No malignancy is identified.    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 Wall thickening of the terminal ileum again seen. Fluid-filled distal small bowel, ascending, and transverse colon without obstruction. 2. Equivocal improvement in omental and peritoneal metastatic disease with slightly improved small volume perihepatic and perisplenic ascites. Right lower quadrant mesenteric adenopathy is similar or mildly improved. 3. Sigmoid colonic diverticulosis. Mild mural wall thickening in the sigmoid, no acute diverticulitis. 4. Gastrostomy tube in place, balloon normally positioned in the stomach. 5. Chronic bilateral L5 pars interarticularis defects with trace anterolisthesis of L5 on S1. Chronic avascular necrosis of the left femoral head.   01/16/2021 Genetic Testing   Negative genetic testing. CTNNA1 [id number] VUS, RAD51D p.G140E VUS and TSC1 p.R768H VUS found on the CancerNext-Expanded+RNAinsight.  The CancerNext-Expanded gene panel offered by [facility] and includes sequencing and rearrangement analysis for the following 77 genes: AIP, ALK, APC*, ATM*, AXIN2, BAP1, BARD1, BLM, BMPR1A, BRCA1*, BRCA2*, BRIP1*, CDC73, CDH1*, CDK4, CDKN1B, CDKN2A, CHEK2*, CTNNA1, DICER1, FANCC, FH, FLCN, GALNT12, KIF1B, LZTR1, MAX, MEN1, MET, MLH1*, MSH2*, MSH3, MSH6*, MUTYH*, NBN, NF1*, NF2, NTHL1, PALB2*, PHOX2B, PMS2*, POT1, PRKAR1A, PTCH1, PTEN*, RAD51C*, RAD51D*, RB1, RECQL, RET, SDHA, SDHAF2, SDHB, SDHC, SDHD, SMAD4, SMARCA4, SMARCB1, SMARCE1, STK11, SUFU, TMEM127, TP53*, TSC1, TSC2, VHL and XRCC2 (sequencing and deletion/duplication); EGFR, EGLN1, HOXB13, KIT, MITF, PDGFRA, POLD1, and POLE (sequencing only); EPCAM and GREM1 (deletion/duplication only). DNA and RNA analyses performed for * genes. The report date is Jan 16, 2021.   02/24/2021 Imaging   CT AP  IMPRESSION: 1. Interval resolution of previously seen small volume ascites throughout  the abdomen and pelvis. There is redemonstrated, ill-defined stranding and thickening of the peritoneal surfaces and omentum, which is somewhat diminished compared to prior examination. 2. Interval improvement in right lower quadrant mesocolon lymph nodes. 3. Findings are consistent with treatment response of nodal and peritoneal metastatic disease. 4. Unchanged, matted appearing thickening of the terminal ileum and cecal base. 5. Sigmoid diverticulosis with wall thickening of the mid to distal sigmoid, similar to prior examination. Findings are most suggestive of chronic sequelae of diverticulitis.   Aortic Atherosclerosis (ICD10-I70.0).   03/13/2021 -  Chemotherapy    Patient is on Treatment Plan: COLORECTAL FOLFOXIRI + BEVACIZUMAB Q14D         CURRENT THERAPY: First line FOLFOX q2 weeks starting 12/18/20, Bevacizumab added w C2. Added irinotecan from C6 03/13/21  INTERVAL HISTORY: Ricardo [name] returns for follow-up and treatment as scheduled.  He was last seen 03/27/2021, completed cycle 2 FOLFOXIRI with increased 5-FU and leucovorin.   REVIEW OF SYSTEMS:   Constitutional: Denies fevers, chills or abnormal weight loss Eyes: Denies blurriness of vision Ears, nose, mouth, throat, and face: Denies mucositis or sore throat Respiratory: Denies cough, dyspnea or wheezes Cardiovascular: Denies palpitation, chest discomfort or lower extremity swelling Gastrointestinal:  Denies nausea, heartburn or change in bowel habits Skin: Denies abnormal skin rashes Lymphatics: Denies new lymphadenopathy or easy bruising Neurological:Denies numbness, tingling or new weaknesses Behavioral/Psych: Mood is stable, no new changes  All other systems were reviewed with the patient and are negative.  MEDICAL HISTORY:  Past Medical History:  Diagnosis Date   Arthritis    Colon cancer [facility])    with metastasis   Family history of adverse reaction to anesthesia    mother had problem with it due to her  asthma   Family history of breast cancer  Family history of prostate cancer     SURGICAL HISTORY: Past Surgical History:  Procedure Laterality Date   BOWEL RESECTION N/A 11/29/2020   Procedure: PARTIAL BOWEL RESECTION;  Surgeon: Toth, Paul III, MD;  Location: [facility] OR;  Service: General;  Laterality: N/A;   GASTROSTOMY Left 11/29/2020   Procedure: INSERTION OF GASTROSTOMY TUBE;  Surgeon: Toth, Paul III, MD;  Location: [facility] OR;  Service: General;  Laterality: Left;   IR IMAGING GUIDED PORT INSERTION  12/12/2020   LAPAROSCOPIC APPENDECTOMY N/A 01/17/2019   Procedure: APPENDECTOMY LAPAROSCOPIC;  Surgeon: Ramirez, Armando, MD;  Location: [facility] OR;  Service: General;  Laterality: N/A;   LAPAROTOMY N/A 11/29/2020   Procedure: EXPLORATORY LAPAROTOMY;  Surgeon: Toth, Paul III, MD;  Location: [facility] OR;  Service: General;  Laterality: N/A;  PUT CASE IN ROOM 1 STARTING AT 9:30AM FOR 120 MIN   OSTOMY N/A 11/29/2020   Procedure: POSSIBLE OSTOMY CREATION;  Surgeon: Toth, Paul III, MD;  Location: [facility] OR;  Service: General;  Laterality: N/A;   RECTAL BIOPSY N/A 11/29/2020   Procedure: PERITIONEAL BIOPSY;  Surgeon: Toth, Paul III, MD;  Location: [facility] OR;  Service: General;  Laterality: N/A;   SHOULDER SURGERY Left 09/19/2015    I have reviewed the social history and family history with the patient and they are unchanged from previous note.  ALLERGIES:  is allergic to oxaliplatin.  MEDICATIONS:  Current Outpatient Medications  Medication Sig Dispense Refill   acetaminophen (TYLENOL) 500 MG tablet Take 1,000 mg by mouth every 6 (six) hours as needed for mild pain.     dicyclomine (BENTYL) 10 MG capsule Take 1 capsule (10 mg total) by mouth 4 (four) times daily -  before meals and at bedtime. 120 capsule 1   diphenoxylate-atropine (LOMOTIL) 2.5-0.025 MG tablet Take 2 tablets by mouth 4 (four) times daily as needed for diarrhea or loose stools. 60 tablet 1   hydrocortisone (ANUSOL-HC) 2.5 % rectal cream Place 1 application  rectally 2 (two) times daily. 30 g 1   oxyCODONE (OXY IR/ROXICODONE) 5 MG immediate release tablet Take 1 tablet (5 mg total) by mouth every 6 (six) hours as needed for severe pain. 60 tablet 0   prochlorperazine (COMPAZINE) 10 MG tablet Take 1 tablet (10 mg total) by mouth every 6 (six) hours as needed for nausea or vomiting. 30 tablet 2   zolpidem (AMBIEN) 5 MG tablet Take 1 tablet (5 mg total) by mouth at bedtime as needed for sleep. 30 tablet 0   Current Facility-Administered Medications  Medication Dose Route Frequency Provider Last Rate Last Admin   0.9 %  sodium chloride infusion   Intravenous PRN Causey, Lindsey Cornetto, NP        PHYSICAL EXAMINATION: ECOG PERFORMANCE STATUS: {CHL ONC ECOG PS:[id number]}  There were no vitals filed for this visit. There were no vitals filed for this visit.  GENERAL:alert, no distress and comfortable SKIN: skin color, texture, turgor are normal, no rashes or significant lesions EYES: normal, Conjunctiva are pink and non-injected, sclera clear OROPHARYNX:no exudate, no erythema and lips, buccal mucosa, and tongue normal  NECK: supple, thyroid normal size, non-tender, without nodularity LYMPH:  no palpable lymphadenopathy in the cervical, axillary or inguinal LUNGS: clear to auscultation and percussion with normal breathing effort HEART: regular rate & rhythm and no murmurs and no lower extremity edema ABDOMEN:abdomen soft, non-tender and normal bowel sounds Musculoskeletal:no cyanosis of digits and no clubbing  NEURO: alert & oriented x 3 with fluent speech, no focal motor/sensory deficits    LABORATORY DATA:  I have reviewed the data as listed CBC Latest Ref Rng & Units 03/27/2021 03/13/2021 02/13/2021  WBC 4.0 - 10.5 K/uL 8.5 7.9 4.3  Hemoglobin 13.0 - 17.0 g/dL 14.0 14.4 13.0  Hematocrit 39.0 - 52.0 % 39.4 41.5 36.6(L)  Platelets 150 - 400 K/uL 177 266 153     CMP Latest Ref Rng & Units 03/27/2021 03/13/2021 02/13/2021  Glucose 70 - 99 mg/dL  84 77 90  BUN 6 - 20 mg/dL _0 Creatinine 0.61 - 1.24 mg/dL 0.95 1.07 1.11  Sodium 135 - 145 mmol/L 137 139 138  Potassium 3.5 - 5.1 mmol/L 4.3 4.3 4.0  Chloride 98 - 111 mmol/L 105 108 106  CO2 22 - 32 mmol/L _1 Calcium 8.9 - 10.3 mg/dL 9.2 9.8 8.9  Total Protein 6.5 - 8.1 g/dL 7.6 8.1 7.1  Total Bilirubin 0.3 - 1.2 mg/dL 0.3 0.5 0.5  Alkaline Phos 38 - 126 U/L 123 98 96  AST 15 - 41 U/L _2 ALT 0 - 44 U/L _3 RADIOGRAPHIC STUDIES: I have personally reviewed the radiological images as listed and agreed with the findings in the report. No results found.   ASSESSMENT & PLAN:  No problem-specific Assessment & Plan notes found for this encounter.   No orders of the defined types were placed in this encounter.  All questions were answered. The patient knows to call the clinic with any problems, questions or concerns. No barriers to learning was detected. I spent {CHL ONC TIME VISIT - WEXHB:[id number]} counseling the patient face to face. The total time spent in the appointment was {CHL ONC TIME VISIT - BOFBP:[id number]} and more than 50% was on counseling and review of test results     Alla Feeling, NP 04/07/21

## 2021-04-08 ENCOUNTER — Inpatient Hospital Stay: Payer: BC Managed Care – PPO

## 2021-04-08 ENCOUNTER — Telehealth: Payer: Self-pay | Admitting: Hematology

## 2021-04-08 ENCOUNTER — Telehealth: Payer: Self-pay

## 2021-04-08 ENCOUNTER — Inpatient Hospital Stay: Payer: BC Managed Care – PPO | Admitting: Nurse Practitioner

## 2021-04-08 NOTE — Telephone Encounter (Signed)
This nurse spoke with patient related to his appointment scheduled for today at 9:30 am.  Patient states he was not aware that he had an appointment today.  He has only been coming on Thursdays and Saturdays.  This nurse informed patient that the provider will be notified and he should here from scheduling to have his appointments rescheduled.

## 2021-04-08 NOTE — Telephone Encounter (Signed)
Patient called in to inquire about appt schedule. Advised patient that Appts were scheduled on Tuesday due to provider availability.Mr. Ricardo [name] stated that he is extremely upset and needs his appts on certain days. He expressed that his appts were scheduled on 7/28 but someone cancelled them. I did try to explain to Mr. Ricardo [name] that the appt on 7/28 was for his Fluids and and pump d/c and his actual chemo appt was for today, 7/26. I reached out to both [facility] and [facility] to get the appts r/s. Patient is still unhappy. Escalated to [facility].

## 2021-04-09 ENCOUNTER — Encounter: Payer: Self-pay | Admitting: Hematology

## 2021-04-09 ENCOUNTER — Inpatient Hospital Stay: Payer: BC Managed Care – PPO

## 2021-04-09 NOTE — Addendum Note (Signed)
Addended by: [name] on: 04/09/2021 09:35 PM   Modules accepted: Orders

## 2021-04-10 ENCOUNTER — Inpatient Hospital Stay: Payer: BC Managed Care – PPO

## 2021-04-10 ENCOUNTER — Other Ambulatory Visit: Payer: Self-pay

## 2021-04-10 VITALS — BP 103/59 | HR 90 | Temp 98.2°F | Ht 71.0 in | Wt 247.0 lb

## 2021-04-10 DIAGNOSIS — C772 Secondary and unspecified malignant neoplasm of intra-abdominal lymph nodes: Secondary | ICD-10-CM

## 2021-04-10 DIAGNOSIS — C182 Malignant neoplasm of ascending colon: Secondary | ICD-10-CM | POA: Diagnosis not present

## 2021-04-10 DIAGNOSIS — R197 Diarrhea, unspecified: Secondary | ICD-10-CM

## 2021-04-10 LAB — CMP (CANCER CENTER ONLY)
ALT: 26 U/L (ref 0–44)
AST: 19 U/L (ref 15–41)
Albumin: 4.1 g/dL (ref 3.5–5.0)
Alkaline Phosphatase: 112 U/L (ref 38–126)
Anion gap: 7 (ref 5–15)
BUN: 18 mg/dL (ref 6–20)
CO2: 27 mmol/L (ref 22–32)
Calcium: 9.8 mg/dL (ref 8.9–10.3)
Chloride: 104 mmol/L (ref 98–111)
Creatinine: 1.09 mg/dL (ref 0.61–1.24)
GFR, Estimated: >60 mL/min (ref >60)
Glucose, Bld: 97 mg/dL (ref 70–99)
Potassium: 4.3 mmol/L (ref 3.5–5.1)
Sodium: 138 mmol/L (ref 135–145)
Total Bilirubin: 0.4 mg/dL (ref 0.3–1.2)
Total Protein: 7.5 g/dL (ref 6.5–8.1)

## 2021-04-10 LAB — CBC WITH DIFFERENTIAL (CANCER CENTER ONLY)
Abs Immature Granulocytes: 0.07 10*3/uL (ref 0.00–0.07)
Basophils Absolute: 0 10*3/uL (ref 0.0–0.1)
Basophils Relative: 0 %
Eosinophils Absolute: 0.1 10*3/uL (ref 0.0–0.5)
Eosinophils Relative: 1 %
HCT: 41.8 % (ref 39.0–52.0)
Hemoglobin: 14.3 g/dL (ref 13.0–17.0)
Immature Granulocytes: 1 %
Lymphocytes Relative: 15 %
Lymphs Abs: 1.3 10*3/uL (ref 0.7–4.0)
MCH: 31.4 pg (ref 26.0–34.0)
MCHC: 34.2 g/dL (ref 30.0–36.0)
MCV: 91.7 fL (ref 80.0–100.0)
Monocytes Absolute: 0.8 10*3/uL (ref 0.1–1.0)
Monocytes Relative: 9 %
Neutro Abs: 6.5 10*3/uL (ref 1.7–7.7)
Neutrophils Relative %: 74 %
Platelet Count: 216 10*3/uL (ref 150–400)
RBC: 4.56 MIL/uL (ref 4.22–5.81)
RDW: 17.3 % — ABNORMAL HIGH (ref 11.5–15.5)
WBC Count: 8.6 10*3/uL (ref 4.0–10.5)
nRBC: 0 % (ref 0.0–0.2)

## 2021-04-10 MED ORDER — PALONOSETRON HCL INJECTION 0.25 MG/5ML
INTRAVENOUS | Status: AC
  Filled 2021-04-10: qty 5

## 2021-04-10 MED ORDER — SODIUM CHLORIDE 0.9 % IV SOLN
2800.0000 mg/m2 | INTRAVENOUS | Status: DC
  Administered 2021-04-10: 6550 mg via INTRAVENOUS
  Filled 2021-04-10: qty 131

## 2021-04-10 MED ORDER — LEUCOVORIN CALCIUM INJECTION 350 MG
400.0000 mg/m2 | Freq: Once | INTRAVENOUS | Status: AC
  Administered 2021-04-10: 936 mg via INTRAVENOUS
  Filled 2021-04-10: qty 46.8

## 2021-04-10 MED ORDER — SODIUM CHLORIDE 0.9 % IV SOLN
5.0000 mg/kg | Freq: Once | INTRAVENOUS | Status: AC
  Administered 2021-04-10: 500 mg via INTRAVENOUS
  Filled 2021-04-10: qty 16

## 2021-04-10 MED ORDER — ATROPINE SULFATE 1 MG/ML IJ SOLN
0.5000 mg | Freq: Once | INTRAMUSCULAR | Status: AC | PRN
  Administered 2021-04-10: 0.5 mg via INTRAVENOUS

## 2021-04-10 MED ORDER — SODIUM CHLORIDE 0.9 % IV SOLN
180.0000 mg/m2 | Freq: Once | INTRAVENOUS | Status: AC
  Administered 2021-04-10: 420 mg via INTRAVENOUS
  Filled 2021-04-10: qty 15

## 2021-04-10 MED ORDER — FAMOTIDINE IN NACL 20-0.9 MG/50ML-% IV SOLN: 20.0000 mg | Freq: Once | INTRAVENOUS | Status: DC

## 2021-04-10 MED ORDER — ATROPINE SULFATE 1 MG/ML IJ SOLN
INTRAMUSCULAR | Status: AC
  Filled 2021-04-10: qty 1

## 2021-04-10 MED ORDER — METHYLPREDNISOLONE SODIUM SUCC 125 MG IJ SOLR
125.0000 mg | Freq: Once | INTRAMUSCULAR | Status: AC
  Administered 2021-04-10: 125 mg via INTRAVENOUS

## 2021-04-10 MED ORDER — DEXTROSE 5 % IV SOLN
Freq: Once | INTRAVENOUS | Status: DC
  Filled 2021-04-10: qty 250

## 2021-04-10 MED ORDER — SODIUM CHLORIDE 0.9 % IV SOLN
150.0000 mg | Freq: Once | INTRAVENOUS | Status: AC
  Administered 2021-04-10: 150 mg via INTRAVENOUS
  Filled 2021-04-10: qty 150

## 2021-04-10 MED ORDER — METHYLPREDNISOLONE SODIUM SUCC 125 MG IJ SOLR
INTRAMUSCULAR | Status: AC
  Filled 2021-04-10: qty 2

## 2021-04-10 MED ORDER — DIPHENHYDRAMINE HCL 50 MG/ML IJ SOLN
INTRAMUSCULAR | Status: AC
  Filled 2021-04-10: qty 1

## 2021-04-10 MED ORDER — DIPHENHYDRAMINE HCL 50 MG/ML IJ SOLN
50.0000 mg | Freq: Once | INTRAMUSCULAR | Status: AC
  Administered 2021-04-10: 25 mg via INTRAVENOUS

## 2021-04-10 MED ORDER — SODIUM CHLORIDE 0.9 % IV SOLN
INTRAVENOUS | Status: DC
  Filled 2021-04-10 (×2): qty 250

## 2021-04-10 MED ORDER — PALONOSETRON HCL INJECTION 0.25 MG/5ML
0.2500 mg | Freq: Once | INTRAVENOUS | Status: AC
  Administered 2021-04-10: 0.25 mg via INTRAVENOUS

## 2021-04-10 MED ORDER — SODIUM CHLORIDE 0.9 % IV SOLN
Freq: Once | INTRAVENOUS | Status: AC
  Filled 2021-04-10: qty 250

## 2021-04-10 MED ORDER — LORATADINE 10 MG PO TABS
10.0000 mg | ORAL_TABLET | Freq: Every day | ORAL | Status: DC
  Administered 2021-04-10: 10 mg via ORAL
  Filled 2021-04-10: qty 1

## 2021-04-10 MED ORDER — FAMOTIDINE 20 MG IN NS 100 ML IVPB
20.0000 mg | Freq: Two times a day (BID) | INTRAVENOUS | Status: DC
  Administered 2021-04-10: 20 mg via INTRAVENOUS
  Filled 2021-04-10: qty 100

## 2021-04-10 NOTE — Patient Instructions (Signed)
[facility] [place]  Discharge Instructions: Thank you for choosing [facility] to provide your oncology and hematology care.   If you have a lab appointment with the [facility], please go directly to the [facility] and check in at the registration area.  Wear comfortable clothing and clothing appropriate for easy access to any Portacath or PICC line.   We strive to give you quality time with your provider. You may need to reschedule your appointment if you arrive late (15 or more minutes).  Arriving late affects you and other patients whose appointments are after yours.  Also, if you miss three or more appointments without notifying the office, you may be dismissed from the clinic at the provider's discretion.      For prescription refill requests, have your pharmacy contact our office and allow 72 hours for refills to be completed.    Today you received the following chemotherapy and/or immunotherapy agents Camptosar, Leucovorin, 5FU, Avastin      To help prevent nausea and vomiting after your treatment, we encourage you to take your nausea medication as directed.  BELOW ARE SYMPTOMS THAT SHOULD BE REPORTED IMMEDIATELY: *FEVER GREATER THAN 100.4 F (38 C) OR HIGHER *CHILLS OR SWEATING *NAUSEA AND VOMITING THAT IS NOT CONTROLLED WITH YOUR NAUSEA MEDICATION *UNUSUAL SHORTNESS OF BREATH *UNUSUAL BRUISING OR BLEEDING *URINARY PROBLEMS (pain or burning when urinating, or frequent urination) *BOWEL PROBLEMS (unusual diarrhea, constipation, pain near the anus) TENDERNESS IN MOUTH AND THROAT WITH OR WITHOUT PRESENCE OF ULCERS (sore throat, sores in mouth, or a toothache) UNUSUAL RASH, SWELLING OR PAIN  UNUSUAL VAGINAL DISCHARGE OR ITCHING   Items with * indicate a potential emergency and should be followed up as soon as possible or go to the Emergency Department if any problems should occur.  Please show the CHEMOTHERAPY ALERT CARD or IMMUNOTHERAPY ALERT  CARD at check-in to the Emergency Department and triage nurse. Should you have questions after your visit or need to cancel or reschedule your appointment, please contact [facility]  [phone] and follow the prompts.  Office hours are 8:00 a.m. to 4:30 p.m. Monday - Friday. Please note that voicemails left after 4:00 p.m. may not be returned until the following business day.  We are closed weekends and major holidays. You have access to a nurse at all times for urgent questions. Please call the main number to the clinic [phone] and follow the prompts.  For any non-urgent questions, you may also contact your provider using MyChart. We now offer e-Visits for anyone 92 and older to request care online for non-urgent symptoms. For details visit [email or web address].   Also download the MyChart app! Go to the app store, search "MyChart", open the app, select [facility], and log in with your MyChart username and password.  Due to Covid, a mask is required upon entering the hospital/clinic. If you do not have a mask, one will be given to you upon arrival. For doctor visits, patients may have 1 support person aged 38 or older with them. For treatment visits, patients cannot have anyone with them due to current Covid guidelines and our immunocompromised population. Fluorouracil, 5-FU injection What is this medication? FLUOROURACIL, 5-FU (flure oh YOOR a sil) is a chemotherapy drug. It slows the growth of cancer cells. This medicine is used to treat many types of cancer like breast cancer, colon or rectal cancer, pancreatic cancer, and stomachcancer. This medicine may be used for  other purposes; ask your health care provider orpharmacist if you have questions. COMMON BRAND NAME(S): Adrucil What should I tell my care team before I take this medication? They need to know if you have any of these conditions: blood disorders dihydropyrimidine dehydrogenase (DPD)  deficiency infection (especially a virus infection such as chickenpox, cold sores, or herpes) kidney disease liver disease malnourished, poor nutrition recent or ongoing radiation therapy an unusual or allergic reaction to fluorouracil, other chemotherapy, other medicines, foods, dyes, or preservatives pregnant or trying to get pregnant breast-feeding How should I use this medication? This drug is given as an infusion or injection into a vein. It is administeredin a hospital or clinic by a specially trained health care professional. Talk to your pediatrician regarding the use of this medicine in children.Special care may be needed. Overdosage: If you think you have taken too much of this medicine contact apoison control center or emergency room at once. NOTE: This medicine is only for you. Do not share this medicine with others. What if I miss a dose? It is important not to miss your dose. Call your doctor or health careprofessional if you are unable to keep an appointment. What may interact with this medication? Do not take this medicine with any of the following medications: live virus vaccines This medicine may also interact with the following medications: medicines that treat or prevent blood clots like warfarin, enoxaparin, and dalteparin This list may not describe all possible interactions. Give your health care provider a list of all the medicines, herbs, non-prescription drugs, or dietary supplements you use. Also tell them if you smoke, drink alcohol, or use illegaldrugs. Some items may interact with your medicine. What should I watch for while using this medication? Visit your doctor for checks on your progress. This drug may make you feel generally unwell. This is not uncommon, as chemotherapy can affect healthy cells as well as cancer cells. Report any side effects. Continue your course oftreatment even though you feel ill unless your doctor tells you to stop. In some cases, you  may be given additional medicines to help with side effects.Follow all directions for their use. Call your doctor or health care professional for advice if you get a fever, chills or sore throat, or other symptoms of a cold or flu. Do not treat yourself. This drug decreases your body's ability to fight infections. Try toavoid being around people who are sick. This medicine may increase your risk to bruise or bleed. Call your doctor orhealth care professional if you notice any unusual bleeding. Be careful brushing and flossing your teeth or using a toothpick because you may get an infection or bleed more easily. If you have any dental work done,tell your dentist you are receiving this medicine. Avoid taking products that contain aspirin, acetaminophen, ibuprofen, naproxen, or ketoprofen unless instructed by your doctor. These medicines may hide afever. Do not become pregnant while taking this medicine. Women should inform their doctor if they wish to become pregnant or think they might be pregnant. There is a potential for serious side effects to an unborn child. Talk to your health care professional or pharmacist for more information. Do not breast-feed aninfant while taking this medicine. Men should inform their doctor if they wish to father a child. This medicinemay lower sperm counts. Do not treat diarrhea with over the counter products. Contact your doctor ifyou have diarrhea that lasts more than 2 days or if it is severe and watery. This medicine can make you  more sensitive to the sun. Keep out of the sun. If you cannot avoid being in the sun, wear protective clothing and use sunscreen.Do not use sun lamps or tanning beds/booths. What side effects may I notice from receiving this medication? Side effects that you should report to your doctor or health care professionalas soon as possible: allergic reactions like skin rash, itching or hives, swelling of the face, lips, or tongue low blood counts -  this medicine may decrease the number of white blood cells, red blood cells and platelets. You may be at increased risk for infections and bleeding. signs of infection - fever or chills, cough, sore throat, pain or difficulty passing urine signs of decreased platelets or bleeding - bruising, pinpoint red spots on the skin, black, tarry stools, blood in the urine signs of decreased red blood cells - unusually weak or tired, fainting spells, lightheadedness breathing problems changes in vision chest pain mouth sores nausea and vomiting pain, swelling, redness at site where injected pain, tingling, numbness in the hands or feet redness, swelling, or sores on hands or feet stomach pain unusual bleeding Side effects that usually do not require medical attention (report to yourdoctor or health care professional if they continue or are bothersome): changes in finger or toe nails diarrhea dry or itchy skin hair loss headache loss of appetite sensitivity of eyes to the light stomach upset unusually teary eyes This list may not describe all possible side effects. Call your doctor for medical advice about side effects. You may report side effects to FDA at1-800-FDA-1088. Where should I keep my medication? This drug is given in a hospital or clinic and will not be stored at home. NOTE: This sheet is a summary. It may not cover all possible information. If you have questions about this medicine, talk to your doctor, pharmacist, orhealth care provider.  2022 Elsevier/Gold Standard (2019-08-01 15:00:03) Leucovorin injection What is this medication? LEUCOVORIN (loo koe VOR in) is used to prevent or treat the harmful effects of some medicines. This medicine is used to treat anemia caused by a low amount of folic acid in the body. It is also used with 5-fluorouracil (5-FU) to treatcolon cancer. This medicine may be used for other purposes; ask your health care provider orpharmacist if you have  questions. What should I tell my care team before I take this medication? They need to know if you have any of these conditions: anemia from low levels of vitamin B-12 in the blood an unusual or allergic reaction to leucovorin, folic acid, other medicines, foods, dyes, or preservatives pregnant or trying to get pregnant breast-feeding How should I use this medication? This medicine is for injection into a muscle or into a vein. It is given by ahealth care professional in a hospital or clinic setting. Talk to your pediatrician regarding the use of this medicine in children.Special care may be needed. Overdosage: If you think you have taken too much of this medicine contact apoison control center or emergency room at once. NOTE: This medicine is only for you. Do not share this medicine with others. What if I miss a dose? This does not apply. What may interact with this medication? capecitabine fluorouracil phenobarbital phenytoin primidone trimethoprim-sulfamethoxazole This list may not describe all possible interactions. Give your health care provider a list of all the medicines, herbs, non-prescription drugs, or dietary supplements you use. Also tell them if you smoke, drink alcohol, or use illegaldrugs. Some items may interact with your medicine. What should I  watch for while using this medication? Your condition will be monitored carefully while you are receiving thismedicine. This medicine may increase the side effects of 5-fluorouracil, 5-FU. Tell your doctor or health care professional if you have diarrhea or mouth sores that donot get better or that get worse. What side effects may I notice from receiving this medication? Side effects that you should report to your doctor or health care professionalas soon as possible: allergic reactions like skin rash, itching or hives, swelling of the face, lips, or tongue breathing problems fever, infection mouth sores unusual bleeding or  bruising unusually weak or tired Side effects that usually do not require medical attention (report to yourdoctor or health care professional if they continue or are bothersome): constipation or diarrhea loss of appetite nausea, vomiting This list may not describe all possible side effects. Call your doctor for medical advice about side effects. You may report side effects to FDA at1-800-FDA-1088. Where should I keep my medication? This drug is given in a hospital or clinic and will not be stored at home. NOTE: This sheet is a summary. It may not cover all possible information. If you have questions about this medicine, talk to your doctor, pharmacist, orhealth care provider.  2022 Elsevier/Gold Standard (2008-03-06 16:50:29) Irinotecan injection What is this medication? IRINOTECAN (ir in oh TEE kan ) is a chemotherapy drug. It is used to treatcolon and rectal cancer. This medicine may be used for other purposes; ask your health care provider orpharmacist if you have questions. COMMON BRAND NAME(S): Camptosar What should I tell my care team before I take this medication? They need to know if you have any of these conditions: dehydration diarrhea infection (especially a virus infection such as chickenpox, cold sores, or herpes) liver disease low blood counts, like low white cell, platelet, or red cell counts low levels of calcium, magnesium, or potassium in the blood recent or ongoing radiation therapy an unusual or allergic reaction to irinotecan, other medicines, foods, dyes, or preservatives pregnant or trying to get pregnant breast-feeding How should I use this medication? This drug is given as an infusion into a vein. It is administered in a hospitalor clinic by a specially trained health care professional. Talk to your pediatrician regarding the use of this medicine in children.Special care may be needed. Overdosage: If you think you have taken too much of this medicine contact  apoison control center or emergency room at once. NOTE: This medicine is only for you. Do not share this medicine with others. What if I miss a dose? It is important not to miss your dose. Call your doctor or health careprofessional if you are unable to keep an appointment. What may interact with this medication? Do not take this medicine with any of the following medications: cobicistat itraconazole This medicine may interact with the following medications: antiviral medicines for HIV or AIDS certain antibiotics like rifampin or rifabutin certain medicines for fungal infections like ketoconazole, posaconazole, and voriconazole certain medicines for seizures like carbamazepine, phenobarbital, phenotoin clarithromycin gemfibrozil nefazodone [facility]. John's Wort This list may not describe all possible interactions. Give your health care provider a list of all the medicines, herbs, non-prescription drugs, or dietary supplements you use. Also tell them if you smoke, drink alcohol, or use illegaldrugs. Some items may interact with your medicine. What should I watch for while using this medication? Your condition will be monitored carefully while you are receiving this medicine. You will need important blood work done while you are taking  thismedicine. This drug may make you feel generally unwell. This is not uncommon, as chemotherapy can affect healthy cells as well as cancer cells. Report any side effects. Continue your course of treatment even though you feel ill unless yourdoctor tells you to stop. In some cases, you may be given additional medicines to help with side effects.Follow all directions for their use. You may get drowsy or dizzy. Do not drive, use machinery, or do anything that needs mental alertness until you know how this medicine affects you. Do not stand or sit up quickly, especially if you are an older patient. This reducesthe risk of dizzy or fainting spells. Call your health care  professional for advice if you get a fever, chills, or sore throat, or other symptoms of a cold or flu. Do not treat yourself. This medicine decreases your body's ability to fight infections. Try to avoid beingaround people who are sick. Avoid taking products that contain aspirin, acetaminophen, ibuprofen, naproxen, or ketoprofen unless instructed by your doctor. These medicines may hide afever. This medicine may increase your risk to bruise or bleed. Call your doctor orhealth care professional if you notice any unusual bleeding. Be careful brushing and flossing your teeth or using a toothpick because you may get an infection or bleed more easily. If you have any dental work done,tell your dentist you are receiving this medicine. Do not become pregnant while taking this medicine or for 6 months after stopping it. Women should inform their health care professional if they wish to become pregnant or think they might be pregnant. Men should not father a child while taking this medicine and for 3 months after stopping it. There is potential for serious side effects to an unborn child. Talk to your health careprofessional for more information. Do not breast-feed an infant while taking this medicine or for 7 days afterstopping it. This medicine has caused ovarian failure in some women. This medicine may make it more difficult to get pregnant. Talk to your health care professional if [place] concerned about your fertility. This medicine has caused decreased sperm counts in some men. This may make it more difficult to father a child. Talk to your health care professional if [place] concerned about your fertility. What side effects may I notice from receiving this medication? Side effects that you should report to your doctor or health care professionalas soon as possible: allergic reactions like skin rash, itching or hives, swelling of the face, lips, or tongue chest pain diarrhea flushing, runny nose, sweating  during infusion low blood counts - this medicine may decrease the number of white blood cells, red blood cells and platelets. You may be at increased risk for infections and bleeding. nausea, vomiting pain, swelling, warmth in the leg signs of decreased platelets or bleeding - bruising, pinpoint red spots on the skin, black, tarry stools, blood in the urine signs of infection - fever or chills, cough, sore throat, pain or difficulty passing urine signs of decreased red blood cells - unusually weak or tired, fainting spells, lightheadedness Side effects that usually do not require medical attention (report to yourdoctor or health care professional if they continue or are bothersome): constipation hair loss headache loss of appetite mouth sores stomach pain This list may not describe all possible side effects. Call your doctor for medical advice about side effects. You may report side effects to FDA at1-800-FDA-1088. Where should I keep my medication? This drug is given in a hospital or clinic and will not be stored  at home. NOTE: This sheet is a summary. It may not cover all possible information. If you have questions about this medicine, talk to your doctor, pharmacist, orhealth care provider.  2022 Elsevier/Gold Standard (2019-08-01 17:46:13)

## 2021-04-12 ENCOUNTER — Other Ambulatory Visit: Payer: Self-pay

## 2021-04-12 ENCOUNTER — Inpatient Hospital Stay: Payer: BC Managed Care – PPO

## 2021-04-12 VITALS — BP 130/83 | HR 80 | Temp 98.2°F | Resp 18 | Ht 71.0 in

## 2021-04-12 DIAGNOSIS — C772 Secondary and unspecified malignant neoplasm of intra-abdominal lymph nodes: Secondary | ICD-10-CM

## 2021-04-12 DIAGNOSIS — C182 Malignant neoplasm of ascending colon: Secondary | ICD-10-CM

## 2021-04-12 MED ORDER — HEPARIN SOD (PORK) LOCK FLUSH 100 UNIT/ML IV SOLN
500.0000 [IU] | Freq: Once | INTRAVENOUS | Status: AC | PRN
  Administered 2021-04-12: 500 [IU]
  Filled 2021-04-12: qty 5

## 2021-04-12 MED ORDER — SODIUM CHLORIDE 0.9% FLUSH
10.0000 mL | INTRAVENOUS | Status: DC | PRN
  Administered 2021-04-12: 10 mL
  Filled 2021-04-12: qty 10

## 2021-04-12 MED ORDER — SODIUM CHLORIDE 0.9 % IV SOLN
INTRAVENOUS | Status: DC
  Filled 2021-04-12 (×2): qty 250

## 2021-04-12 MED ORDER — PEGFILGRASTIM-CBQV 6 MG/0.6ML ~~LOC~~ SOSY
6.0000 mg | PREFILLED_SYRINGE | Freq: Once | SUBCUTANEOUS | Status: AC
  Administered 2021-04-12: 6 mg via SUBCUTANEOUS

## 2021-04-13 ENCOUNTER — Encounter: Payer: Self-pay | Admitting: Hematology

## 2021-04-13 MED ORDER — DIPHENOXYLATE-ATROPINE 2.5-0.025 MG PO TABS: 2.0000 | ORAL_TABLET | Freq: Four times a day (QID) | ORAL | 1 refills | Status: DC | PRN

## 2021-04-18 ENCOUNTER — Other Ambulatory Visit: Payer: Self-pay | Admitting: Nurse Practitioner

## 2021-04-24 ENCOUNTER — Inpatient Hospital Stay: Payer: BC Managed Care – PPO

## 2021-04-24 ENCOUNTER — Other Ambulatory Visit: Payer: BC Managed Care – PPO

## 2021-04-24 ENCOUNTER — Inpatient Hospital Stay: Payer: BC Managed Care – PPO | Admitting: Nutrition

## 2021-04-24 ENCOUNTER — Inpatient Hospital Stay: Payer: BC Managed Care – PPO | Attending: Nurse Practitioner | Admitting: Hematology

## 2021-04-24 ENCOUNTER — Encounter: Payer: Self-pay | Admitting: Hematology

## 2021-04-24 ENCOUNTER — Other Ambulatory Visit: Payer: Self-pay

## 2021-04-24 VITALS — BP 132/91 | HR 94 | Temp 97.7°F | Resp 19 | Ht 71.0 in | Wt 252.9 lb

## 2021-04-24 DIAGNOSIS — Z5111 Encounter for antineoplastic chemotherapy: Secondary | ICD-10-CM | POA: Insufficient documentation

## 2021-04-24 DIAGNOSIS — C786 Secondary malignant neoplasm of retroperitoneum and peritoneum: Secondary | ICD-10-CM | POA: Diagnosis not present

## 2021-04-24 DIAGNOSIS — C182 Malignant neoplasm of ascending colon: Secondary | ICD-10-CM | POA: Insufficient documentation

## 2021-04-24 DIAGNOSIS — C772 Secondary and unspecified malignant neoplasm of intra-abdominal lymph nodes: Secondary | ICD-10-CM | POA: Diagnosis not present

## 2021-04-24 DIAGNOSIS — Z95828 Presence of other vascular implants and grafts: Secondary | ICD-10-CM

## 2021-04-24 DIAGNOSIS — Z79899 Other long term (current) drug therapy: Secondary | ICD-10-CM | POA: Insufficient documentation

## 2021-04-24 LAB — CBC WITH DIFFERENTIAL (CANCER CENTER ONLY)
Abs Immature Granulocytes: 0.03 10*3/uL (ref 0.00–0.07)
Basophils Absolute: 0 10*3/uL (ref 0.0–0.1)
Basophils Relative: 0 %
Eosinophils Absolute: 0.1 10*3/uL (ref 0.0–0.5)
Eosinophils Relative: 1 %
HCT: 42.5 % (ref 39.0–52.0)
Hemoglobin: 14.3 g/dL (ref 13.0–17.0)
Immature Granulocytes: 0 %
Lymphocytes Relative: 17 %
Lymphs Abs: 1.3 10*3/uL (ref 0.7–4.0)
MCH: 31.2 pg (ref 26.0–34.0)
MCHC: 33.6 g/dL (ref 30.0–36.0)
MCV: 92.8 fL (ref 80.0–100.0)
Monocytes Absolute: 0.6 10*3/uL (ref 0.1–1.0)
Monocytes Relative: 8 %
Neutro Abs: 5.8 10*3/uL (ref 1.7–7.7)
Neutrophils Relative %: 74 %
Platelet Count: 187 10*3/uL (ref 150–400)
RBC: 4.58 MIL/uL (ref 4.22–5.81)
RDW: 17 % — ABNORMAL HIGH (ref 11.5–15.5)
WBC Count: 7.9 10*3/uL (ref 4.0–10.5)
nRBC: 0 % (ref 0.0–0.2)

## 2021-04-24 LAB — CMP (CANCER CENTER ONLY)
ALT: 31 U/L (ref 0–44)
AST: 21 U/L (ref 15–41)
Albumin: 3.9 g/dL (ref 3.5–5.0)
Alkaline Phosphatase: 122 U/L (ref 38–126)
Anion gap: 9 (ref 5–15)
BUN: 14 mg/dL (ref 6–20)
CO2: 25 mmol/L (ref 22–32)
Calcium: 9.5 mg/dL (ref 8.9–10.3)
Chloride: 107 mmol/L (ref 98–111)
Creatinine: 1.26 mg/dL — ABNORMAL HIGH (ref 0.61–1.24)
GFR, Estimated: >60 mL/min (ref >60)
Glucose, Bld: 94 mg/dL (ref 70–99)
Potassium: 4.4 mmol/L (ref 3.5–5.1)
Sodium: 141 mmol/L (ref 135–145)
Total Bilirubin: 0.6 mg/dL (ref 0.3–1.2)
Total Protein: 7.7 g/dL (ref 6.5–8.1)

## 2021-04-24 MED ORDER — DIPHENHYDRAMINE HCL 25 MG PO CAPS
ORAL_CAPSULE | ORAL | Status: AC
  Filled 2021-04-24: qty 2

## 2021-04-24 MED ORDER — PALONOSETRON HCL INJECTION 0.25 MG/5ML
0.2500 mg | Freq: Once | INTRAVENOUS | Status: AC
  Administered 2021-04-24: 0.25 mg via INTRAVENOUS
  Filled 2021-04-24: qty 5

## 2021-04-24 MED ORDER — SODIUM CHLORIDE 0.9 % IV SOLN
150.0000 mg | Freq: Once | INTRAVENOUS | Status: AC
  Administered 2021-04-24: 150 mg via INTRAVENOUS
  Filled 2021-04-24: qty 150

## 2021-04-24 MED ORDER — FAMOTIDINE 20 MG IN NS 100 ML IVPB
20.0000 mg | Freq: Two times a day (BID) | INTRAVENOUS | Status: DC
  Administered 2021-04-24: 20 mg via INTRAVENOUS
  Filled 2021-04-24: qty 100

## 2021-04-24 MED ORDER — DIPHENHYDRAMINE HCL 50 MG/ML IJ SOLN
50.0000 mg | Freq: Once | INTRAMUSCULAR | Status: AC
  Administered 2021-04-24: 50 mg via INTRAVENOUS

## 2021-04-24 MED ORDER — DIPHENHYDRAMINE HCL 50 MG/ML IJ SOLN
INTRAMUSCULAR | Status: AC
  Filled 2021-04-24: qty 1

## 2021-04-24 MED ORDER — SODIUM CHLORIDE 0.9 % IV SOLN
2800.0000 mg/m2 | INTRAVENOUS | Status: DC
  Administered 2021-04-24: 6550 mg via INTRAVENOUS
  Filled 2021-04-24: qty 131

## 2021-04-24 MED ORDER — ATROPINE SULFATE 1 MG/ML IJ SOLN
0.5000 mg | Freq: Once | INTRAMUSCULAR | Status: AC | PRN
  Administered 2021-04-24: 0.5 mg via INTRAVENOUS
  Filled 2021-04-24: qty 1

## 2021-04-24 MED ORDER — LORATADINE 10 MG PO TABS
10.0000 mg | ORAL_TABLET | Freq: Every day | ORAL | Status: DC
  Administered 2021-04-24: 10 mg via ORAL
  Filled 2021-04-24: qty 1

## 2021-04-24 MED ORDER — SODIUM CHLORIDE 0.9% FLUSH
10.0000 mL | Freq: Once | INTRAVENOUS | Status: AC
Start: 2021-04-24 — End: 2021-04-24
  Administered 2021-04-24: 10 mL
  Filled 2021-04-24: qty 10

## 2021-04-24 MED ORDER — SODIUM CHLORIDE 0.9 % IV SOLN
180.0000 mg/m2 | Freq: Once | INTRAVENOUS | Status: AC
  Administered 2021-04-24: 420 mg via INTRAVENOUS
  Filled 2021-04-24: qty 15

## 2021-04-24 MED ORDER — METHYLPREDNISOLONE SODIUM SUCC 125 MG IJ SOLR
125.0000 mg | Freq: Once | INTRAMUSCULAR | Status: AC
  Administered 2021-04-24: 125 mg via INTRAVENOUS

## 2021-04-24 MED ORDER — METHYLPREDNISOLONE SODIUM SUCC 125 MG IJ SOLR
INTRAMUSCULAR | Status: AC
  Filled 2021-04-24: qty 2

## 2021-04-24 MED ORDER — SODIUM CHLORIDE 0.9 % IV SOLN
Freq: Once | INTRAVENOUS | Status: AC
Start: 2021-04-24 — End: 2021-04-24
  Filled 2021-04-24: qty 250

## 2021-04-24 MED ORDER — SODIUM CHLORIDE 0.9 % IV SOLN
INTRAVENOUS | Status: DC
  Filled 2021-04-24 (×2): qty 250

## 2021-04-24 MED ORDER — LEUCOVORIN CALCIUM INJECTION 350 MG
400.0000 mg/m2 | Freq: Once | INTRAVENOUS | Status: AC
  Administered 2021-04-24: 936 mg via INTRAVENOUS
  Filled 2021-04-24: qty 46.8

## 2021-04-24 NOTE — Progress Notes (Signed)
[place]   [phone] [phone]   Clinic Follow up Note   Patient Care Team: [name], MD as PCP - General (Internal Medicine) [name], MD as Consulting Physician (Oncology) [name], RN (Inactive) as Oncology Nurse Navigator  Date of Service:  04/24/2021  CHIEF COMPLAINT: f/u of metastatic colon cancer  SUMMARY OF ONCOLOGIC HISTORY: Oncology History Overview Note  Cancer Staging metastatic cecal cancer Staging form: Colon and Rectum, AJCC 8th Edition - Clinical stage from 11/29/2020: Stage IVC (cTX, cN2, pM1c) - Signed by [name], MD on 12/04/2020 Stage prefix: Initial diagnosis Histologic grade (G): G3 Histologic grading system: 4 grade system    metastatic cecal cancer  11/27/2020 Imaging   CT Angio CAP  IMPRESSION: 1. Thickening of the terminal ileum with associated proximal mid to distal small bowel obstruction. Findings could be due to an ileitis versus malignancy. Nonspecific mesenteric edema could be due to engorgement versus metastases. No associated bowel perforation. Recommend endoscopy for further evaluation. 2. Indeterminate right lower quadrant lymphadenopathy. 3. Scattered colonic diverticulosis with no acute diverticulitis. 4. Stable right hepatic lobe subcentimeter hyperdensity likely represents a hepatic hemangioma. 5. No acute vascular abnormality. Aortic Atherosclerosis (ICD10-I70.0) - mild. 6. No acute intrathoracic abnormality.   11/28/2020 Imaging   CT AP  IMPRESSION: 1. There is masslike, circumferential thickening of the terminal ileum and cecal base near the ileocecal valve and abnormally enlarged lymph nodes in the right lower quadrant mesentery adjacent to the terminal ileum measuring up to 2.3 x 1.4 cm. 2. There is extensive omental and peritoneal nodularity and caking throughout the abdomen. 3. Findings are highly concerning for primary colon malignancy with nodal and  peritoneal metastatic disease. 4. Small volume perihepatic and perisplenic ascites. 5. The small bowel is generally decompressed, with full some fluid-filled, nondistended loops throughout. There is transit of oral enteric contrast to the terminal ileum. No evidence of overt bowel obstruction at this time. Esophagogastric tube is position with tip and side port below the diaphragm. 6. Atelectasis or consolidation of the dependent bilateral lung bases, new compared to prior examination.   Aortic Atherosclerosis (ICD10-I70.0).     11/29/2020 Surgery   EXPLORATORY LAPAROTOMY, PARTIAL BOWEL RESECTION, POSSIBLE OSTOMY CREATION, PERITIONEAL BIOPSY, INSERTION OF GASTROSTOMY TUBE by Dr [name]   11/29/2020 Initial Biopsy   FINAL MICROSCOPIC DIAGNOSIS:   AB. 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    COMMENT:   Immunohistochemistry (IHC) for CK20 and CDX-2 is strong and diffusely  positive.  CK7, TTF-1, Synaptophysin, Chromogranin and CD56 are  negative.  The immunophenotype is compatible with origin from the lower  gastrointestinal tract.  IHC for MMR will be reported separately.  Case  preliminarily discussed with Dr. [name] on 12/02/2020.   At the request of Dr. [name], [phone] was reviewed in retrospect.  Review of the submitted sections confirms the presence of acute  appendicitis.  No malignancy is identified.    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 Wall thickening of the terminal ileum again seen. Fluid-filled distal small bowel, ascending, and transverse colon without obstruction. 2. Equivocal improvement in omental and peritoneal metastatic disease with slightly improved small volume perihepatic and perisplenic ascites. Right lower quadrant mesenteric adenopathy is similar or mildly improved. 3. Sigmoid colonic diverticulosis. Mild mural wall thickening in the sigmoid, no acute diverticulitis. 4. Gastrostomy tube in place, balloon normally positioned in the stomach. 5. Chronic bilateral L5 pars interarticularis defects with trace anterolisthesis of L5 on S1. Chronic avascular necrosis of the left femoral head.   01/16/2021 Genetic Testing   Negative genetic testing. CTNNA1 [id number] VUS, RAD51D p.G140E VUS and TSC1 p.R768H VUS found on the CancerNext-Expanded+RNAinsight.  The CancerNext-Expanded gene panel offered by [facility] and includes sequencing and rearrangement analysis for the following 77 genes: AIP, ALK, APC*, ATM*, AXIN2, BAP1, BARD1, BLM, BMPR1A, BRCA1*, BRCA2*, BRIP1*, CDC73, CDH1*, CDK4, CDKN1B, CDKN2A, CHEK2*, CTNNA1, DICER1, FANCC, FH, FLCN, GALNT12, KIF1B, LZTR1, MAX, MEN1, MET, MLH1*, MSH2*, MSH3, MSH6*, MUTYH*, NBN, NF1*, NF2, NTHL1, PALB2*, PHOX2B, PMS2*, POT1, PRKAR1A, PTCH1, PTEN*, RAD51C*, RAD51D*, RB1, RECQL, RET, SDHA, SDHAF2, SDHB, SDHC, SDHD, SMAD4, SMARCA4, SMARCB1, SMARCE1, STK11, SUFU, TMEM127, TP53*, TSC1, TSC2, VHL and XRCC2 (sequencing and deletion/duplication); EGFR, EGLN1, HOXB13, KIT, MITF, PDGFRA, POLD1, and POLE (sequencing only); EPCAM and GREM1 (deletion/duplication only). DNA and RNA analyses performed for * genes. The report date is Jan 16, 2021.   02/24/2021 Imaging   CT AP  IMPRESSION: 1. Interval resolution of previously seen small volume  ascites throughout the abdomen and pelvis. There is redemonstrated, ill-defined stranding and thickening of the peritoneal surfaces and omentum, which is somewhat diminished compared to prior examination. 2. Interval improvement in right lower quadrant mesocolon lymph nodes. 3. Findings are consistent with treatment response of nodal and peritoneal metastatic disease. 4. Unchanged, matted appearing thickening of the terminal ileum and cecal base. 5. Sigmoid diverticulosis with wall thickening of the mid to distal sigmoid, similar to prior examination. Findings are most suggestive of chronic sequelae of diverticulitis.   Aortic Atherosclerosis (ICD10-I70.0).   03/13/2021 -  Chemotherapy    Patient is on Treatment Plan: COLORECTAL FOLFOXIRI + BEVACIZUMAB Q14D          CURRENT THERAPY:  First line FOLFOX q2 weeks starting 12/18/20. Bevacizumab added with cycle 2. Added Irinotecan from C6 on 03/13/21.  INTERVAL HISTORY:  Ricardo [name] is here for a follow up of metastatic colon cancer. He was last seen by me on 03/13/21 and by NP Lacie in the interim. He presents to the clinic accompanied by his wife. He reports he has tolerated chemo very well with only fatigue. (He notes he does not sleep well the night before chemo.) He notes his lack of symptoms has made him somewhat anxious. He notes school will be starting up later this month.   All other systems were reviewed with the patient and are negative.  MEDICAL HISTORY:  Past Medical History:  Diagnosis Date   Arthritis    Colon cancer ([place])    with metastasis   Family history of adverse reaction to anesthesia    mother had problem with it due to her asthma   Family history of breast cancer    Family history of prostate cancer     SURGICAL HISTORY: Past Surgical History:  Procedure  Laterality Date   BOWEL RESECTION N/A 11/29/2020   Procedure: PARTIAL BOWEL RESECTION;  Surgeon: Jovita Kussmaul, MD;  Location: [place];  Service:  General;  Laterality: N/A;   GASTROSTOMY Left 11/29/2020   Procedure: INSERTION OF GASTROSTOMY TUBE;  Surgeon: Jovita Kussmaul, MD;  Location: [place];  Service: General;  Laterality: Left;   IR IMAGING GUIDED PORT INSERTION  12/12/2020   LAPAROSCOPIC APPENDECTOMY N/A 01/17/2019   Procedure: APPENDECTOMY LAPAROSCOPIC;  Surgeon: Ralene Ok, MD;  Location: [facility];  Service: General;  Laterality: N/A;   LAPAROTOMY N/A 11/29/2020   Procedure: EXPLORATORY LAPAROTOMY;  Surgeon: Jovita Kussmaul, MD;  Location: [place];  Service: General;  Laterality: N/A;  PUT CASE IN ROOM 1 STARTING AT 9:30AM FOR 120 MIN   OSTOMY N/A 11/29/2020   Procedure: POSSIBLE OSTOMY CREATION;  Surgeon: Jovita Kussmaul, MD;  Location: [place];  Service: General;  Laterality: N/A;   RECTAL BIOPSY N/A 11/29/2020   Procedure: PERITIONEAL BIOPSY;  Surgeon: Jovita Kussmaul, MD;  Location: [place];  Service: General;  Laterality: N/A;   SHOULDER SURGERY Left 09/19/2015    I have reviewed the social history and family history with the patient and they are unchanged from previous note.  ALLERGIES:  is allergic to oxaliplatin.  MEDICATIONS:  Current Outpatient Medications  Medication Sig Dispense Refill   acetaminophen (TYLENOL) 500 MG tablet Take 1,000 mg by mouth every 6 (six) hours as needed for mild pain.     dicyclomine (BENTYL) 10 MG capsule TAKE 1 CAPSULE (10 MG TOTAL) BY MOUTH 4 (FOUR) TIMES DAILY - BEFORE MEALS AND AT BEDTIME. 120 capsule 1   diphenoxylate-atropine (LOMOTIL) 2.5-0.025 MG tablet Take 2 tablets by mouth 4 (four) times daily as needed for diarrhea or loose stools. 60 tablet 1   hydrocortisone (ANUSOL-HC) 2.5 % rectal cream Place 1 application rectally 2 (two) times daily. 30 g 1   ondansetron (ZOFRAN) 8 MG tablet TAKE 1 TABLET BY MOUTH 2TIMES DAILY AS NEEDED FOR NAUSEA/VOMITING. START ON DAY3 AFTER CHEMOTHERAPY.     oxyCODONE (OXY IR/ROXICODONE) 5 MG immediate release tablet Take 1 tablet (5 mg total) by mouth every 6  (six) hours as needed for severe pain. 60 tablet 0   prochlorperazine (COMPAZINE) 10 MG tablet Take 1 tablet (10 mg total) by mouth every 6 (six) hours as needed for nausea or vomiting. 30 tablet 2   zolpidem (AMBIEN) 5 MG tablet Take 1 tablet (5 mg total) by mouth at bedtime as needed for sleep. 30 tablet 0   Current Facility-Administered Medications  Medication Dose Route Frequency Provider Last Rate Last Admin   0.9 %  sodium chloride infusion   Intravenous PRN Causey, Charlestine Massed, NP       Facility-Administered Medications Ordered in Other Visits  Medication Dose Route Frequency Provider Last Rate Last Admin   0.9 %  sodium chloride infusion   Intravenous Continuous [name], MD   Stopped at 04/24/21 1547   diphenhydrAMINE (BENADRYL) 50 MG/ML injection            famotidine (PEPCID) IVPB 20 mg in NS 100 mL IVPB  20 mg Intravenous Q12H [name], MD   Stopped at 04/24/21 1359   fluorouracil (ADRUCIL) 6,550 mg in sodium chloride 0.9 % 119 mL chemo infusion  2,800 mg/m2 (Treatment Plan Recorded) Intravenous 1 day or 1 dose [name], MD   6,550 mg at 04/24/21 1605   loratadine (CLARITIN) tablet 10 mg  10 mg Oral  Daily [name], MD   10 mg at 04/24/21 1301   methylPREDNISolone sodium succinate (SOLU-MEDROL) 125 mg/2 mL injection             PHYSICAL EXAMINATION: ECOG PERFORMANCE STATUS: 1 - Symptomatic but completely ambulatory  Vitals:   04/24/21 1047  BP: (!) 132/91  Pulse: 94  Resp: 19  Temp: 97.7 F (36.5 C)  SpO2: 98%   Wt Readings from Last 3 Encounters:  04/24/21 252 lb 14.4 oz (114.7 kg)  04/10/21 247 lb 0.6 oz (112.1 kg)  03/27/21 245 lb 4.8 oz (111.3 kg)     GENERAL:alert, no distress and comfortable SKIN: skin color normal, no rashes or significant lesions EYES: normal, Conjunctiva are pink and non-injected, sclera clear  NEURO: alert & oriented x 3 with fluent speech  LABORATORY DATA:  I have reviewed the data as listed CBC Latest Ref Rng & Units  04/24/2021 04/10/2021 03/27/2021  WBC 4.0 - 10.5 K/uL 7.9 8.6 8.5  Hemoglobin 13.0 - 17.0 g/dL 14.3 14.3 14.0  Hematocrit 39.0 - 52.0 % 42.5 41.8 39.4  Platelets 150 - 400 K/uL 187 216 177     CMP Latest Ref Rng & Units 04/24/2021 04/10/2021 03/27/2021  Glucose 70 - 99 mg/dL 94 97 84  BUN 6 - 20 mg/dL _0 Creatinine 0.61 - 1.24 mg/dL 1.26(H) 1.09 0.95  Sodium 135 - 145 mmol/L 141 138 137  Potassium 3.5 - 5.1 mmol/L 4.4 4.3 4.3  Chloride 98 - 111 mmol/L 107 104 105  CO2 22 - 32 mmol/L _1 Calcium 8.9 - 10.3 mg/dL 9.5 9.8 9.2  Total Protein 6.5 - 8.1 g/dL 7.7 7.5 7.6  Total Bilirubin 0.3 - 1.2 mg/dL 0.6 0.4 0.3  Alkaline Phos 38 - 126 U/L 122 112 123  AST 15 - 41 U/L _2 ALT 0 - 44 U/L _3 RADIOGRAPHIC STUDIES: I have personally reviewed the radiological images as listed and agreed with the findings in the report. No results found.   ASSESSMENT & PLAN:  Ricardo [name] is a 55 y.o. male with   1. 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 Work up showed mass in the terminal ileum with extensive omental nodularity and LN involvement. His baseline tumor marker CA 19-9 and CEA were normal. 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 This confirms stage IV metastatic cancer from cecum -He started first-line palliative chemo with FOLFOX on 12/18/20.  -FO shows KRAS/NRAS wildtype, but given his right side colon cancer, we do not use EGFR inhibitor in first line.  -Bevacizumab added with cycle 2. Experienced worsening  abdominal pain, nausea, and vomiting the day after cycle 2, prompting an ED visit. NO recurrent symptoms since he changed back to soft and low residual diet -CT AP w/o contrast from 02/24/21 showed Interval resolution of previously seen small volume ascites, Interval improvement in right lower quadrant mesocolon lymph nodes -Escalated to FOLFOXFIRI with C6.  He had a recurrent allergy reaction (skin rash and throat tightness) to oxaliplatin, which was subsequently stopped after  cycle 7. -He met with Dr. Clovis Riley on 04/09/21 and plans to undergo laparoscopy on 05/13/21 to determine if he is a candidate for CRS-HIPEC. -He tolerated last cycle FOLFIRI and bevacizumab very well, no diarrhea or other noticeable side effects -He is clinically doing well, will proceed chemo today if lab is adequate.   2. Symptom Management: Constipation, lower abdominal pain, abdominal bloating secondary #1 and partial bowl obstruction, Herniated Rectum -For 8-9 weeks before hospitalization he was having increasing lower abdominal pain and constipation -He did have G-tube placed with surgery on 11/29/20. He has not needed to use this since starting chemo. -He also reports receiving IV fluids following pump removal has helped tremendously. -He has adjusted his diet, gained back weight, and continues to feel better. He has not needed to take any medications for the last 5 weeks. -He will meet with our nutritionist today.   3. Genetic testing -He has brother with prostate cancer and sister with breast cancer in their 78s. -Genetics counseling and labs on 12/25/20, results negative.   4. Social Support -He is an avid cyclist. Last long distant riding was 06/2020 across [place]. He has finally been able to get back to biking (in 02/2021) since his diagnosis. -He is married with 1 adult son. His brother Jenny Reichmann is in town. He works as a Designer, television/film set at [employer]. He may take time off work based on how to tolerate  chemotherapy. -He was previously offered [facility] resources including chaplin and SW if needed. He notes he has good family support    5. Covid-19+ -Positive on at-home test on 01/20/21 -Received bebtelovimab on 01/24/21 -has recovered completely      Plan: -Proceed with FOLFIRI and beva today with 1L IVF today and on day 3 (OK not to give IVF on day 3 if he feels well), Udenyca on day 3, will hold bevacizumab due to upcoming surgery -proceed with laparoscopy on 05/13/21 with Dr. Clovis Riley to determine if he is a candidate for HIPEC surgery  -Lab, Flush, F/u and next cycle of chemo in 4 weeks, with pegfilgramstin on day 3.      No problem-specific Assessment & Plan notes found for this encounter.   No orders of the defined types were placed in this encounter.  All questions were answered. The patient knows to call the clinic with any problems, questions or concerns. No barriers to learning was detected. The total time spent in the appointment was 30 minutes.     [name], MD 04/24/2021   I, Wilburn Mylar, am acting as scribe for [name], MD.   I have reviewed the above documentation for accuracy and completeness, and I agree with the above.

## 2021-04-24 NOTE — Progress Notes (Signed)
Brief nutrition follow-up completed with patient during infusion.  Patient being treated for metastatic colon cancer. Weight improved to 252 pounds from 239 pounds June 30. Patient reports he is eating well.  Denies nausea, vomiting, constipation, and diarrhea.  He is drinking increased fluids. He has questions about ice cream and effects on symptom management.  Nutrition diagnosis: Food and nutrition related knowledge deficit improved.  Educated patient on continuing foods as tolerated.  Questions answered.  I encouraged patient to contact me if he develops questions or concerns. He has my contact information.  No follow-up scheduled.

## 2021-04-24 NOTE — Progress Notes (Signed)
Ok per pharmacy to run both irrinotecan and leucovorin together and over 1 hour since pt is not receiving oxaliplatin (per MD.)  Home infusion pump rate changed to 5.4 to reflect change from 48hrs to 46hrs per Dr.Feng, MD, double checked by Katheren Puller, RN

## 2021-04-24 NOTE — Patient Instructions (Signed)
The chemotherapy medication bag should finish at 46 hours, 96 hours, or 7 days. For example, if your pump is scheduled for 46 hours and it was put on at 4:00 p.m., it should finish at 2:00 p.m. the day it is scheduled to come off regardless of your appointment time.     Estimated time to finish at 1605 on Saturday 04/26/2021.   If the display on your pump reads "Low Volume" and it is beeping, take the batteries out of the pump and come to the cancer center for it to be taken off.   If the pump alarms go off prior to the pump reading "Low Volume" then call [phone] and someone can assist you.  If the plunger comes out and the chemotherapy medication is leaking out, please use your home chemo spill kit to clean up the spill. Do NOT use paper towels or other household products.  If you have problems or questions regarding your pump, please call either [phone] (24 hours a day) or the cancer center Monday-Friday 8:00 a.m.- 4:30 p.m. at the clinic number and we will assist you. If you are unable to get assistance, then go to the nearest Emergency Department and ask the staff to contact the IV team for assistance.

## 2021-04-25 ENCOUNTER — Telehealth: Payer: Self-pay | Admitting: Hematology

## 2021-04-25 NOTE — Telephone Encounter (Signed)
Scheduled appts per 8/11 los. Pt aware.  

## 2021-04-25 NOTE — Telephone Encounter (Signed)
R/s appt time per RN Melanie. Called pt, no answer. Left msg with updated appt time.

## 2021-04-26 ENCOUNTER — Inpatient Hospital Stay: Payer: BC Managed Care – PPO

## 2021-04-26 ENCOUNTER — Other Ambulatory Visit: Payer: Self-pay

## 2021-04-26 VITALS — BP 138/75 | HR 86 | Temp 97.9°F | Resp 18

## 2021-04-26 DIAGNOSIS — C182 Malignant neoplasm of ascending colon: Secondary | ICD-10-CM | POA: Diagnosis not present

## 2021-04-26 DIAGNOSIS — C772 Secondary and unspecified malignant neoplasm of intra-abdominal lymph nodes: Secondary | ICD-10-CM

## 2021-04-26 MED ORDER — PEGFILGRASTIM-CBQV 6 MG/0.6ML ~~LOC~~ SOSY
PREFILLED_SYRINGE | SUBCUTANEOUS | Status: AC
  Administered 2021-04-26: 6 mg via SUBCUTANEOUS
  Filled 2021-04-26: qty 0.6

## 2021-04-26 MED ORDER — SODIUM CHLORIDE 0.9% FLUSH
10.0000 mL | INTRAVENOUS | Status: DC | PRN
  Administered 2021-04-26: 10 mL
  Filled 2021-04-26: qty 10

## 2021-04-26 MED ORDER — PEGFILGRASTIM-CBQV 6 MG/0.6ML ~~LOC~~ SOSY: 6.0000 mg | PREFILLED_SYRINGE | Freq: Once | SUBCUTANEOUS | Status: AC

## 2021-04-26 MED ORDER — HEPARIN SOD (PORK) LOCK FLUSH 100 UNIT/ML IV SOLN
500.0000 [IU] | Freq: Once | INTRAVENOUS | Status: AC | PRN
  Administered 2021-04-26: 500 [IU]
  Filled 2021-04-26: qty 5

## 2021-04-26 MED ORDER — SODIUM CHLORIDE 0.9 % IV SOLN
INTRAVENOUS | Status: DC
  Filled 2021-04-26 (×2): qty 250

## 2021-04-26 NOTE — Patient Instructions (Signed)

## 2021-04-30 DIAGNOSIS — Z8616 Personal history of COVID-19: Secondary | ICD-10-CM | POA: Insufficient documentation

## 2021-04-30 HISTORY — DX: Personal history of COVID-19: Z86.16

## 2021-05-21 ENCOUNTER — Telehealth: Payer: Self-pay

## 2021-05-21 MED FILL — Fosaprepitant Dimeglumine For IV Infusion 150 MG (Base Eq): INTRAVENOUS | Qty: 5 | Status: AC

## 2021-05-21 NOTE — Telephone Encounter (Signed)
This nurse spoke with patient who wanted to know if he had an appt with provider on 05/22/21 or is it just labs and infusion.  This nurse made patient aware that he will see MD following his labs and then he is scheduled for infusion.  No further questions or concerns at this time.

## 2021-05-22 ENCOUNTER — Inpatient Hospital Stay: Payer: BC Managed Care – PPO

## 2021-05-22 ENCOUNTER — Inpatient Hospital Stay: Payer: BC Managed Care – PPO | Attending: Nurse Practitioner

## 2021-05-22 ENCOUNTER — Inpatient Hospital Stay (HOSPITAL_BASED_OUTPATIENT_CLINIC_OR_DEPARTMENT_OTHER): Payer: BC Managed Care – PPO | Admitting: Hematology

## 2021-05-22 ENCOUNTER — Other Ambulatory Visit: Payer: Self-pay

## 2021-05-22 ENCOUNTER — Encounter: Payer: Self-pay | Admitting: Hematology

## 2021-05-22 VITALS — BP 124/85 | HR 77 | Temp 98.2°F | Resp 18 | Ht 71.0 in | Wt 257.0 lb

## 2021-05-22 DIAGNOSIS — C772 Secondary and unspecified malignant neoplasm of intra-abdominal lymph nodes: Secondary | ICD-10-CM

## 2021-05-22 DIAGNOSIS — C182 Malignant neoplasm of ascending colon: Secondary | ICD-10-CM

## 2021-05-22 DIAGNOSIS — Z5111 Encounter for antineoplastic chemotherapy: Secondary | ICD-10-CM | POA: Insufficient documentation

## 2021-05-22 DIAGNOSIS — C786 Secondary malignant neoplasm of retroperitoneum and peritoneum: Secondary | ICD-10-CM

## 2021-05-22 DIAGNOSIS — Z95828 Presence of other vascular implants and grafts: Secondary | ICD-10-CM

## 2021-05-22 DIAGNOSIS — Z79899 Other long term (current) drug therapy: Secondary | ICD-10-CM | POA: Insufficient documentation

## 2021-05-22 LAB — CBC WITH DIFFERENTIAL (CANCER CENTER ONLY)
Abs Immature Granulocytes: 0.05 10*3/uL (ref 0.00–0.07)
Basophils Absolute: 0 10*3/uL (ref 0.0–0.1)
Basophils Relative: 0 %
Eosinophils Absolute: 0.2 10*3/uL (ref 0.0–0.5)
Eosinophils Relative: 2 %
HCT: 40.6 % (ref 39.0–52.0)
Hemoglobin: 13.9 g/dL (ref 13.0–17.0)
Immature Granulocytes: 1 %
Lymphocytes Relative: 12 %
Lymphs Abs: 1.2 10*3/uL (ref 0.7–4.0)
MCH: 31.7 pg (ref 26.0–34.0)
MCHC: 34.2 g/dL (ref 30.0–36.0)
MCV: 92.5 fL (ref 80.0–100.0)
Monocytes Absolute: 1.1 10*3/uL — ABNORMAL HIGH (ref 0.1–1.0)
Monocytes Relative: 11 %
Neutro Abs: 7.1 10*3/uL (ref 1.7–7.7)
Neutrophils Relative %: 74 %
Platelet Count: 277 10*3/uL (ref 150–400)
RBC: 4.39 MIL/uL (ref 4.22–5.81)
RDW: 14.2 % (ref 11.5–15.5)
WBC Count: 9.5 10*3/uL (ref 4.0–10.5)
nRBC: 0 % (ref 0.0–0.2)

## 2021-05-22 LAB — CMP (CANCER CENTER ONLY)
ALT: 19 U/L (ref 0–44)
AST: 17 U/L (ref 15–41)
Albumin: 3.5 g/dL (ref 3.5–5.0)
Alkaline Phosphatase: 102 U/L (ref 38–126)
Anion gap: 11 (ref 5–15)
BUN: 19 mg/dL (ref 6–20)
CO2: 24 mmol/L (ref 22–32)
Calcium: 9.4 mg/dL (ref 8.9–10.3)
Chloride: 103 mmol/L (ref 98–111)
Creatinine: 1.19 mg/dL (ref 0.61–1.24)
GFR, Estimated: >60 mL/min (ref >60)
Glucose, Bld: 104 mg/dL — ABNORMAL HIGH (ref 70–99)
Potassium: 4.1 mmol/L (ref 3.5–5.1)
Sodium: 138 mmol/L (ref 135–145)
Total Bilirubin: 0.6 mg/dL (ref 0.3–1.2)
Total Protein: 7.6 g/dL (ref 6.5–8.1)

## 2021-05-22 MED ORDER — SODIUM CHLORIDE 0.9 % IV SOLN
400.0000 mg/m2 | Freq: Once | INTRAVENOUS | Status: AC
  Administered 2021-05-22: 936 mg via INTRAVENOUS
  Filled 2021-05-22: qty 46.8

## 2021-05-22 MED ORDER — ATROPINE SULFATE 1 MG/ML IJ SOLN
INTRAMUSCULAR | Status: AC
  Filled 2021-05-22: qty 1

## 2021-05-22 MED ORDER — METHYLPREDNISOLONE SODIUM SUCC 125 MG IJ SOLR
INTRAMUSCULAR | Status: AC
  Filled 2021-05-22: qty 2

## 2021-05-22 MED ORDER — SODIUM CHLORIDE 0.9 % IV SOLN
2800.0000 mg/m2 | INTRAVENOUS | Status: DC
  Administered 2021-05-22: 6550 mg via INTRAVENOUS
  Filled 2021-05-22: qty 131

## 2021-05-22 MED ORDER — ATROPINE SULFATE 1 MG/ML IJ SOLN
0.5000 mg | Freq: Once | INTRAMUSCULAR | Status: AC | PRN
  Administered 2021-05-22: 0.5 mg via INTRAVENOUS

## 2021-05-22 MED ORDER — LORATADINE 10 MG PO TABS
10.0000 mg | ORAL_TABLET | Freq: Once | ORAL | Status: AC
  Administered 2021-05-22: 10 mg via ORAL

## 2021-05-22 MED ORDER — SODIUM CHLORIDE 0.9 % IV SOLN
150.0000 mg | Freq: Once | INTRAVENOUS | Status: AC
  Administered 2021-05-22: 150 mg via INTRAVENOUS
  Filled 2021-05-22: qty 150

## 2021-05-22 MED ORDER — PALONOSETRON HCL INJECTION 0.25 MG/5ML
INTRAVENOUS | Status: AC
  Filled 2021-05-22: qty 5

## 2021-05-22 MED ORDER — LORATADINE 10 MG PO TABS
ORAL_TABLET | ORAL | Status: AC
  Filled 2021-05-22: qty 1

## 2021-05-22 MED ORDER — PALONOSETRON HCL INJECTION 0.25 MG/5ML
0.2500 mg | Freq: Once | INTRAVENOUS | Status: AC
  Administered 2021-05-22: 0.25 mg via INTRAVENOUS

## 2021-05-22 MED ORDER — FAMOTIDINE 20 MG IN NS 100 ML IVPB
20.0000 mg | Freq: Once | INTRAVENOUS | Status: AC
  Administered 2021-05-22: 20 mg via INTRAVENOUS

## 2021-05-22 MED ORDER — SODIUM CHLORIDE 0.9% FLUSH: 10.0000 mL | INTRAVENOUS | Status: DC | PRN

## 2021-05-22 MED ORDER — SODIUM CHLORIDE 0.9% FLUSH
10.0000 mL | Freq: Once | INTRAVENOUS | Status: AC
  Administered 2021-05-22: 10 mL

## 2021-05-22 MED ORDER — METHYLPREDNISOLONE SODIUM SUCC 125 MG IJ SOLR
125.0000 mg | Freq: Once | INTRAMUSCULAR | Status: AC
  Administered 2021-05-22: 125 mg via INTRAVENOUS

## 2021-05-22 MED ORDER — SODIUM CHLORIDE 0.9 % IV SOLN: Freq: Once | INTRAVENOUS | Status: AC

## 2021-05-22 MED ORDER — FAMOTIDINE 20 MG IN NS 100 ML IVPB
INTRAVENOUS | Status: AC
  Filled 2021-05-22: qty 100

## 2021-05-22 MED ORDER — SODIUM CHLORIDE 0.9 % IV SOLN
180.0000 mg/m2 | Freq: Once | INTRAVENOUS | Status: AC
  Administered 2021-05-22: 420 mg via INTRAVENOUS
  Filled 2021-05-22: qty 10

## 2021-05-22 MED ORDER — SODIUM CHLORIDE 0.9 % IV SOLN: INTRAVENOUS | Status: DC

## 2021-05-22 NOTE — Progress Notes (Signed)
Per Dr. Burr Medico, Pt is to get 1L NS over 2Hrs prior to his 5FU Pump discontinuation on Saturday, 05/24/2021.  Pt will arrive on Saturday, 05/24/2021 '@11'$ :00am to start IVF.

## 2021-05-22 NOTE — Progress Notes (Signed)
[place]   Telephone:([phone] Fax:(336) [phone]   Clinic Follow up Note   Patient Care Team: [name], MD as PCP - General (Internal Medicine) [name], MD as Consulting Physician (Oncology) [name], RN (Inactive) as Oncology Nurse Navigator  Date of Service:  05/22/2021  CHIEF COMPLAINT: f/u of metastatic colon cancer  CURRENT THERAPY:  First line FOLFOX q2 weeks starting 12/18/20. Bevacizumab added with cycle 2. Added Irinotecan from C6 on 03/13/21. Oxaliplatin discontinued after C7  ASSESSMENT & PLAN:  Ricardo [name] is a 55 y.o. male with   1. 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 Work up showed mass in the terminal ileum with extensive omental nodularity and LN involvement. His baseline tumor marker CA 19-9 and CEA were normal. 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 This confirms stage IV metastatic cancer from cecum -He started first-line palliative chemo with FOLFOX on 12/18/20.  -FO shows KRAS/NRAS wildtype, but given his right side colon cancer, we do not use EGFR inhibitor in first line.  -Escalated to [facility] with C6.  He had a recurrent allergy reaction (skin rash and throat tightness) to oxaliplatin, which was subsequently stopped after cycle 7. -He underwent laparoscopy on 05/13/21 to determine if he is a candidate for CRS-HIPEC. Unfortunately, due to his extensive peritoneal metastasis,  he is not a candidate.  -We discussed that we will continue his current treatment as long as he tolerates and his cancer is controlled.  We discussed immunotherapy and that his disease is MSI stable, meaning he would not benefit from immunotherapy. -He continues to tolerate FOLFIRI and bevacizumab very well, no diarrhea or other noticeable side effects -He is clinically doing well. Labs reviewed, CBC WNL, CMP pending. Overall adequate to proceed with treatment today.   2. Symptom Management: Constipation, lower abdominal pain, abdominal bloating secondary #1 and partial bowl obstruction, Herniated Rectum -For 8-9 weeks before hospitalization he was having increasing lower abdominal pain and constipation -He did have G-tube placed with surgery on 11/29/20. He has not needed to use this since starting chemo. -He also reports receiving IV fluids following pump removal has helped tremendously. -He has gained weight back and is now just above his baseline weight.   3. Genetic testing -He has brother with prostate cancer and sister with breast cancer in their 60s. -Genetics counseling and labs on 12/25/20, results negative.   4. Social Support -He is an avid cyclist. Last long distant riding was 06/2020 across [place]. He has finally been able to get back to biking (in 02/2021) since his diagnosis. -He is married with 1 adult son. His brother Jenny Reichmann is in town. He works as a Designer, television/film set at [employer]. He may take time off work based on how to tolerate chemotherapy. -He was previously offered [facility] resources including chaplin and SW if needed. He notes he has good family support    5. Covid-19+ -Positive on at-home test on 01/20/21 -Received bebtelovimab on 01/24/21 -has recovered completely      Plan: -Proceed with FOLFIRI today with 1L IVF today and on day 3, Udenyca on day 3    No problem-specific  Assessment & Plan notes found for this encounter.   SUMMARY OF ONCOLOGIC HISTORY: Oncology History Overview Note  Cancer Staging metastatic cecal cancer Staging form: Colon and Rectum, AJCC 8th Edition - Clinical stage from  11/29/2020: Stage IVC (cTX, cN2, pM1c) - Signed by [name], MD on 12/04/2020 Stage prefix: Initial diagnosis Histologic grade (G): G3 Histologic grading system: 4 grade system    metastatic cecal cancer  11/27/2020 Imaging   CT Angio CAP  IMPRESSION: 1. Thickening of the terminal ileum with associated proximal mid to distal small bowel obstruction. Findings could be due to an ileitis versus malignancy. Nonspecific mesenteric edema could be due to engorgement versus metastases. No associated bowel perforation. Recommend endoscopy for further evaluation. 2. Indeterminate right lower quadrant lymphadenopathy. 3. Scattered colonic diverticulosis with no acute diverticulitis. 4. Stable right hepatic lobe subcentimeter hyperdensity likely represents a hepatic hemangioma. 5. No acute vascular abnormality. Aortic Atherosclerosis (ICD10-I70.0) - mild. 6. No acute intrathoracic abnormality.   11/28/2020 Imaging   CT AP  IMPRESSION: 1. There is masslike, circumferential thickening of the terminal ileum and cecal base near the ileocecal valve and abnormally enlarged lymph nodes in the right lower quadrant mesentery adjacent to the terminal ileum measuring up to 2.3 x 1.4 cm. 2. There is extensive omental and peritoneal nodularity and caking throughout the abdomen. 3. Findings are highly concerning for primary colon malignancy with nodal and peritoneal metastatic disease. 4. Small volume perihepatic and perisplenic ascites. 5. The small bowel is generally decompressed, with full some fluid-filled, nondistended loops throughout. There is transit of oral enteric contrast to the terminal ileum. No evidence of overt bowel obstruction at this time. Esophagogastric tube is position with tip and side port below the diaphragm. 6. Atelectasis or consolidation of the dependent bilateral lung bases, new compared to prior examination.   Aortic Atherosclerosis (ICD10-I70.0).     11/29/2020 Surgery    EXPLORATORY LAPAROTOMY, PARTIAL BOWEL RESECTION, POSSIBLE OSTOMY CREATION, PERITIONEAL BIOPSY, INSERTION OF GASTROSTOMY TUBE by Dr [name]   11/29/2020 Initial Biopsy   FINAL MICROSCOPIC DIAGNOSIS:   AB. 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    COMMENT:   Immunohistochemistry (IHC) for CK20 and CDX-2 is strong and diffusely  positive.  CK7, TTF-1, Synaptophysin, Chromogranin and CD56 are  negative.  The immunophenotype is compatible with origin from the lower  gastrointestinal tract.  IHC for MMR will be reported separately.  Case  preliminarily discussed with Dr. [name] on 12/02/2020.   At the request of Dr. [name], [phone] was reviewed in retrospect.  Review of the submitted sections confirms the presence of acute  appendicitis.  No malignancy is identified.    11/29/2020 Cancer Staging   Staging form: Colon and Rectum, AJCC 8th Edition - Clinical stage from 11/29/2020: Stage IVC (cTX, cN2, pM1c) - Signed by [name], MD on 12/04/2020 Stage prefix: Initial diagnosis Histologic grade (G): G3 Histologic grading system: 4 grade system   12/04/2020 Initial Diagnosis   Cancer of ascending colon metastatic to intra-abdominal lymph node ([place])    Chemotherapy   FOLFOX q2weeks    12/18/2020 - 02/15/2021 Chemotherapy      Patient is on Antibody Plan: COLORECTAL BEVACIZUMAB Q14D     01/03/2021 Imaging   CT A/P IMPRESSION: 1. Wall thickening of the terminal ileum again seen. Fluid-filled distal small bowel, ascending, and transverse colon without obstruction. 2. Equivocal improvement in omental and peritoneal metastatic disease with slightly improved small volume  perihepatic and perisplenic ascites. Right lower quadrant mesenteric adenopathy is similar or mildly improved. 3. Sigmoid colonic diverticulosis. Mild mural wall thickening in the sigmoid, no acute diverticulitis. 4.  Gastrostomy tube in place, balloon normally positioned in the stomach. 5. Chronic bilateral L5 pars interarticularis defects with trace anterolisthesis of L5 on S1. Chronic avascular necrosis of the left femoral head.   01/16/2021 Genetic Testing   Negative genetic testing. CTNNA1 [id number] VUS, RAD51D p.G140E VUS and TSC1 p.R768H VUS found on the CancerNext-Expanded+RNAinsight.  The CancerNext-Expanded gene panel offered by [facility] and includes sequencing and rearrangement analysis for the following 77 genes: AIP, ALK, APC*, ATM*, AXIN2, BAP1, BARD1, BLM, BMPR1A, BRCA1*, BRCA2*, BRIP1*, CDC73, CDH1*, CDK4, CDKN1B, CDKN2A, CHEK2*, CTNNA1, DICER1, FANCC, FH, FLCN, GALNT12, KIF1B, LZTR1, MAX, MEN1, MET, MLH1*, MSH2*, MSH3, MSH6*, MUTYH*, NBN, NF1*, NF2, NTHL1, PALB2*, PHOX2B, PMS2*, POT1, PRKAR1A, PTCH1, PTEN*, RAD51C*, RAD51D*, RB1, RECQL, RET, SDHA, SDHAF2, SDHB, SDHC, SDHD, SMAD4, SMARCA4, SMARCB1, SMARCE1, STK11, SUFU, TMEM127, TP53*, TSC1, TSC2, VHL and XRCC2 (sequencing and deletion/duplication); EGFR, EGLN1, HOXB13, KIT, MITF, PDGFRA, POLD1, and POLE (sequencing only); EPCAM and GREM1 (deletion/duplication only). DNA and RNA analyses performed for * genes. The report date is Jan 16, 2021.   02/24/2021 Imaging   CT AP  IMPRESSION: 1. Interval resolution of previously seen small volume ascites throughout the abdomen and pelvis. There is redemonstrated, ill-defined stranding and thickening of the peritoneal surfaces and omentum, which is somewhat diminished compared to prior examination. 2. Interval improvement in right lower quadrant mesocolon lymph nodes. 3. Findings are consistent with treatment response of nodal and peritoneal metastatic disease. 4. Unchanged, matted appearing thickening of the terminal ileum and cecal base. 5. Sigmoid diverticulosis with wall thickening of the mid to distal sigmoid, similar to prior examination. Findings are most suggestive of chronic  sequelae of diverticulitis.   Aortic Atherosclerosis (ICD10-I70.0).   03/13/2021 -  Chemotherapy    Patient is on Treatment Plan: COLORECTAL FOLFOXIRI + BEVACIZUMAB Q14D       05/13/2021 Procedure   OPERATIVE PROCEDURE: Laparoscopy and peritoneal biopsy.  SURGEON: Merlyn Albert. Clovis Riley, MD   05/13/2021 Pathology Results   Final Pathologic Diagnosis    A. PERITONEAL, BIOPSY :              Metastatic adenocarcinoma.               See comment.    Comment    The biopsies show predominately fibrosis  and fibroadipose tissue with a small focus of moderately differentiated adenocarcinoma. Given the patient's history, the findings favor metastasis of the patient's known colonic cancer.        INTERVAL HISTORY:  Ricardo [name] is here for a follow up of metastatic colon cancer. He was last seen by me on 04/24/21. He presents to the clinic accompanied by his wife. They relayed their conversation with Dr. Clovis Riley to me. They report they were told he would never be a candidate for surgery at any point. He reports he feels he has lost a lot of fluids since the laparoscopy, like he can't hold the fluids.   All other systems were reviewed with the patient and are negative.  MEDICAL HISTORY:  Past Medical History:  Diagnosis Date   Arthritis    Colon cancer [facility])    with metastasis   Family history of adverse reaction to anesthesia    mother had problem with it due to her asthma   Family history of breast cancer    Family  history of prostate cancer     SURGICAL HISTORY: Past Surgical History:  Procedure Laterality Date   BOWEL RESECTION N/A 11/29/2020   Procedure: PARTIAL BOWEL RESECTION;  Surgeon: Jovita Kussmaul, MD;  Location: [place];  Service: General;  Laterality: N/A;   GASTROSTOMY Left 11/29/2020   Procedure: INSERTION OF GASTROSTOMY TUBE;  Surgeon: Jovita Kussmaul, MD;  Location: [place];  Service: General;  Laterality: Left;   IR IMAGING GUIDED PORT INSERTION  12/12/2020   LAPAROSCOPIC  APPENDECTOMY N/A 01/17/2019   Procedure: APPENDECTOMY LAPAROSCOPIC;  Surgeon: Ralene Ok, MD;  Location: [place];  Service: General;  Laterality: N/A;   LAPAROTOMY N/A 11/29/2020   Procedure: EXPLORATORY LAPAROTOMY;  Surgeon: Jovita Kussmaul, MD;  Location: [place];  Service: General;  Laterality: N/A;  PUT CASE IN ROOM 1 STARTING AT 9:30AM FOR 120 MIN   OSTOMY N/A 11/29/2020   Procedure: POSSIBLE OSTOMY CREATION;  Surgeon: Jovita Kussmaul, MD;  Location: [place];  Service: General;  Laterality: N/A;   RECTAL BIOPSY N/A 11/29/2020   Procedure: PERITIONEAL BIOPSY;  Surgeon: Jovita Kussmaul, MD;  Location: [place];  Service: General;  Laterality: N/A;   SHOULDER SURGERY Left 09/19/2015    I have reviewed the social history and family history with the patient and they are unchanged from previous note.  ALLERGIES:  is allergic to oxaliplatin.  MEDICATIONS:  Current Outpatient Medications  Medication Sig Dispense Refill   acetaminophen (TYLENOL) 500 MG tablet Take 1,000 mg by mouth every 6 (six) hours as needed for mild pain.     dicyclomine (BENTYL) 10 MG capsule TAKE 1 CAPSULE (10 MG TOTAL) BY MOUTH 4 (FOUR) TIMES DAILY - BEFORE MEALS AND AT BEDTIME. 120 capsule 1   diphenoxylate-atropine (LOMOTIL) 2.5-0.025 MG tablet Take 2 tablets by mouth 4 (four) times daily as needed for diarrhea or loose stools. 60 tablet 1   hydrocortisone (ANUSOL-HC) 2.5 % rectal cream Place 1 application rectally 2 (two) times daily. 30 g 1   ondansetron (ZOFRAN) 8 MG tablet TAKE 1 TABLET BY MOUTH 2TIMES DAILY AS NEEDED FOR NAUSEA/VOMITING. START ON DAY3 AFTER CHEMOTHERAPY.     oxyCODONE (OXY IR/ROXICODONE) 5 MG immediate release tablet Take 1 tablet (5 mg total) by mouth every 6 (six) hours as needed for severe pain. 60 tablet 0   prochlorperazine (COMPAZINE) 10 MG tablet Take 1 tablet (10 mg total) by mouth every 6 (six) hours as needed for nausea or vomiting. 30 tablet 2   zolpidem (AMBIEN) 5 MG tablet Take 1 tablet (5 mg  total) by mouth at bedtime as needed for sleep. 30 tablet 0   Current Facility-Administered Medications  Medication Dose Route Frequency Provider Last Rate Last Admin   0.9 %  sodium chloride infusion   Intravenous PRN Causey, Charlestine Massed, NP       Facility-Administered Medications Ordered in Other Visits  Medication Dose Route Frequency Provider Last Rate Last Admin   0.9 %  sodium chloride infusion   Intravenous Continuous [name], MD   Stopped at 05/22/21 1410   fluorouracil (ADRUCIL) 6,550 mg in sodium chloride 0.9 % 119 mL chemo infusion  2,800 mg/m2 (Treatment Plan Recorded) Intravenous 1 day or 1 dose [name], MD   6,550 mg at 05/22/21 1433   sodium chloride flush (NS) 0.9 % injection 10 mL  10 mL Intracatheter PRN [name], MD        PHYSICAL EXAMINATION: ECOG PERFORMANCE STATUS: 1 - Symptomatic but completely ambulatory  Vitals:   05/22/21 1124  BP: 124/85  Pulse: 77  Resp: 18  Temp: 98.2 F (36.8 C)  SpO2: 98%   Wt Readings from Last 3 Encounters:  05/22/21 257 lb (116.6 kg)  04/24/21 252 lb 14.4 oz (114.7 kg)  04/10/21 247 lb 0.6 oz (112.1 kg)     GENERAL:alert, no distress and comfortable SKIN: skin color normal, no rashes or significant lesions EYES: normal, Conjunctiva are pink and non-injected, sclera clear  NEURO: alert & oriented x 3 with fluent speech  LABORATORY DATA:  I have reviewed the data as listed CBC Latest Ref Rng & Units 05/22/2021 04/24/2021 04/10/2021  WBC 4.0 - 10.5 K/uL 9.5 7.9 8.6  Hemoglobin 13.0 - 17.0 g/dL 13.9 14.3 14.3  Hematocrit 39.0 - 52.0 % 40.6 42.5 41.8  Platelets 150 - 400 K/uL 277 187 216     CMP Latest Ref Rng & Units 05/22/2021 04/24/2021 04/10/2021  Glucose 70 - 99 mg/dL 104(H) 94 97  BUN 6 - 20 mg/dL '19 14 18  ' Creatinine 0.61 - 1.24 mg/dL 1.19 1.26(H) 1.09  Sodium 135 - 145 mmol/L 138 141 138  Potassium 3.5 - 5.1 mmol/L 4.1 4.4 4.3  Chloride 98 - 111 mmol/L 103 107 104  CO2 22 - 32 mmol/L '24 25 27  ' Calcium 8.9 -  10.3 mg/dL 9.4 9.5 9.8  Total Protein 6.5 - 8.1 g/dL 7.6 7.7 7.5  Total Bilirubin 0.3 - 1.2 mg/dL 0.6 0.6 0.4  Alkaline Phos 38 - 126 U/L 102 122 112  AST 15 - 41 U/L '17 21 19  ' ALT 0 - 44 U/L '19 31 26      ' RADIOGRAPHIC STUDIES: I have personally reviewed the radiological images as listed and agreed with the findings in the report. No results found.    No orders of the defined types were placed in this encounter.  All questions were answered. The patient knows to call the clinic with any problems, questions or concerns. No barriers to learning was detected. The total time spent in the appointment was 30 minutes.     [name], MD 05/22/2021   I, Wilburn Mylar, am acting as scribe for [name], MD.   I have reviewed the above documentation for accuracy and completeness, and I agree with the above.

## 2021-05-22 NOTE — Patient Instructions (Signed)
[name] ONCOLOGY  Discharge Instructions: Thank you for choosing [facility] to provide your oncology and hematology care.   If you have a lab appointment with the [facility], please go directly to the [facility] and check in at the registration area.   Wear comfortable clothing and clothing appropriate for easy access to any Portacath or PICC line.   We strive to give you quality time with your provider. You may need to reschedule your appointment if you arrive late (15 or more minutes).  Arriving late affects you and other patients whose appointments are after yours.  Also, if you miss three or more appointments without notifying the office, you may be dismissed from the clinic at the provider's discretion.      For prescription refill requests, have your pharmacy contact our office and allow 72 hours for refills to be completed.    Today you received the following chemotherapy and/or immunotherapy agents FOLFIRI      To help prevent nausea and vomiting after your treatment, we encourage you to take your nausea medication as directed.  BELOW ARE SYMPTOMS THAT SHOULD BE REPORTED IMMEDIATELY: *FEVER GREATER THAN 100.4 F (38 C) OR HIGHER *CHILLS OR SWEATING *NAUSEA AND VOMITING THAT IS NOT CONTROLLED WITH YOUR NAUSEA MEDICATION *UNUSUAL SHORTNESS OF BREATH *UNUSUAL BRUISING OR BLEEDING *URINARY PROBLEMS (pain or burning when urinating, or frequent urination) *BOWEL PROBLEMS (unusual diarrhea, constipation, pain near the anus) TENDERNESS IN MOUTH AND THROAT WITH OR WITHOUT PRESENCE OF ULCERS (sore throat, sores in mouth, or a toothache) UNUSUAL RASH, SWELLING OR PAIN  UNUSUAL VAGINAL DISCHARGE OR ITCHING   Items with * indicate a potential emergency and should be followed up as soon as possible or go to the Emergency Department if any problems should occur.  Please show the CHEMOTHERAPY ALERT CARD or IMMUNOTHERAPY ALERT CARD at check-in to the  Emergency Department and triage nurse.  Should you have questions after your visit or need to cancel or reschedule your appointment, please contact [facility]  Dept: [phone]  and follow the prompts.  Office hours are 8:00 a.m. to 4:30 p.m. Monday - Friday. Please note that voicemails left after 4:00 p.m. may not be returned until the following business day.  We are closed weekends and major holidays. You have access to a nurse at all times for urgent questions. Please call the main number to the clinic Dept: [phone] and follow the prompts.   For any non-urgent questions, you may also contact your provider using MyChart. We now offer e-Visits for anyone 18 and older to request care online for non-urgent symptoms. For details visit [email or web address].   Also download the MyChart app! Go to the app store, search "MyChart", open the app, select [facility], and log in with your MyChart username and password.  Due to Covid, a mask is required upon entering the hospital/clinic. If you do not have a mask, one will be given to you upon arrival. For doctor visits, patients may have 1 support person aged 59 or older with them. For treatment visits, patients cannot have anyone with them due to current Covid guidelines and our immunocompromised population.

## 2021-05-22 NOTE — Progress Notes (Signed)
Per Dr. Burr Medico, we are not doing Bevacizumab-bvzr today d/t pt had surgery.  Dr. Burr Medico, also gave the "OK To increase the rate of the 5FU infusion so the pt will stop by 2pm on Saturday, 05/24/2021".

## 2021-05-24 ENCOUNTER — Other Ambulatory Visit: Payer: Self-pay

## 2021-05-24 ENCOUNTER — Inpatient Hospital Stay: Payer: BC Managed Care – PPO

## 2021-05-24 VITALS — BP 162/91 | HR 105 | Temp 97.5°F | Resp 20

## 2021-05-24 DIAGNOSIS — C182 Malignant neoplasm of ascending colon: Secondary | ICD-10-CM | POA: Diagnosis not present

## 2021-05-24 MED ORDER — SODIUM CHLORIDE 0.9% FLUSH
10.0000 mL | INTRAVENOUS | Status: DC | PRN
  Administered 2021-05-24: 10 mL

## 2021-05-24 MED ORDER — PROCHLORPERAZINE MALEATE 10 MG PO TABS
10.0000 mg | ORAL_TABLET | Freq: Once | ORAL | Status: AC
  Administered 2021-05-24: 10 mg via ORAL
  Filled 2021-05-24: qty 1

## 2021-05-24 MED ORDER — HEPARIN SOD (PORK) LOCK FLUSH 100 UNIT/ML IV SOLN
500.0000 [IU] | Freq: Once | INTRAVENOUS | Status: AC | PRN
  Administered 2021-05-24: 500 [IU]

## 2021-05-24 MED ORDER — PEGFILGRASTIM-CBQV 6 MG/0.6ML ~~LOC~~ SOSY
6.0000 mg | PREFILLED_SYRINGE | Freq: Once | SUBCUTANEOUS | Status: AC
  Administered 2021-05-24: 6 mg via SUBCUTANEOUS
  Filled 2021-05-24: qty 0.6

## 2021-05-24 MED ORDER — SODIUM CHLORIDE 0.9 % IV SOLN: INTRAVENOUS | Status: DC

## 2021-05-24 NOTE — Patient Instructions (Signed)
[facility]  Discharge Instructions: Thank you for choosing [facility] to provide your oncology and hematology care.   If you have a lab appointment with the [facility], please go directly to the [facility] and check in at the registration area.   Wear comfortable clothing and clothing appropriate for easy access to any Portacath or PICC line.   We strive to give you quality time with your provider. You may need to reschedule your appointment if you arrive late (15 or more minutes).  Arriving late affects you and other patients whose appointments are after yours.  Also, if you miss three or more appointments without notifying the office, you may be dismissed from the clinic at the provider's discretion.      For prescription refill requests, have your pharmacy contact our office and allow 72 hours for refills to be completed.    Today you received the following medication - Udenyca      To help prevent nausea and vomiting after your treatment, we encourage you to take your nausea medication as directed.  BELOW ARE SYMPTOMS THAT SHOULD BE REPORTED IMMEDIATELY: *FEVER GREATER THAN 100.4 F (38 C) OR HIGHER *CHILLS OR SWEATING *NAUSEA AND VOMITING THAT IS NOT CONTROLLED WITH YOUR NAUSEA MEDICATION *UNUSUAL SHORTNESS OF BREATH *UNUSUAL BRUISING OR BLEEDING *URINARY PROBLEMS (pain or burning when urinating, or frequent urination) *BOWEL PROBLEMS (unusual diarrhea, constipation, pain near the anus) TENDERNESS IN MOUTH AND THROAT WITH OR WITHOUT PRESENCE OF ULCERS (sore throat, sores in mouth, or a toothache) UNUSUAL RASH, SWELLING OR PAIN  UNUSUAL VAGINAL DISCHARGE OR ITCHING   Items with * indicate a potential emergency and should be followed up as soon as possible or go to the Emergency Department if any problems should occur.  Please show the CHEMOTHERAPY ALERT CARD or IMMUNOTHERAPY ALERT CARD at check-in to the Emergency Department and  triage nurse.  Should you have questions after your visit or need to cancel or reschedule your appointment, please contact [facility]  Dept: [phone]  and follow the prompts.  Office hours are 8:00 a.m. to 4:30 p.m. Monday - Friday. Please note that voicemails left after 4:00 p.m. may not be returned until the following business day.  We are closed weekends and major holidays. You have access to a nurse at all times for urgent questions. Please call the main number to the clinic Dept: [phone] and follow the prompts.   For any non-urgent questions, you may also contact your provider using MyChart. We now offer e-Visits for anyone 85 and older to request care online for non-urgent symptoms. For details visit [email or web address].   Also download the MyChart app! Go to the app store, search "MyChart", open the app, select [facility], and log in with your MyChart username and password.  Due to Covid, a mask is required upon entering the hospital/clinic. If you do not have a mask, one will be given to you upon arrival. For doctor visits, patients may have 1 support person aged 40 or older with them. For treatment visits, patients cannot have anyone with them due to current Covid guidelines and our immunocompromised population.   Pegfilgrastim injection What is this medication? PEGFILGRASTIM (PEG fil gra stim) is a long-acting granulocyte colony-stimulating factor that stimulates the growth of neutrophils, a type of white blood cell important in the body's fight against infection. It is used to reduce the incidence of fever and infection in patients with certain  types of cancer who are receiving chemotherapy that affects the bone marrow, and to increase survival after being exposed to high doses of radiation. This medicine may be used for other purposes; ask your health care provider or pharmacist if you have questions. COMMON BRAND NAME(S): Rexene Edison, Ziextenzo What should I tell my care team before I take this medication? They need to know if you have any of these conditions: kidney disease latex allergy ongoing radiation therapy sickle cell disease skin reactions to acrylic adhesives (On-Body Injector only) an unusual or allergic reaction to pegfilgrastim, filgrastim, other medicines, foods, dyes, or preservatives pregnant or trying to get pregnant breast-feeding How should I use this medication? This medicine is for injection under the skin. If you get this medicine at home, you will be taught how to prepare and give the pre-filled syringe or how to use the On-body Injector. Refer to the patient Instructions for Use for detailed instructions. Use exactly as directed. Tell your healthcare provider immediately if you suspect that the On-body Injector may not have performed as intended or if you suspect the use of the On-body Injector resulted in a missed or partial dose. It is important that you put your used needles and syringes in a special sharps container. Do not put them in a trash can. If you do not have a sharps container, call your pharmacist or healthcare provider to get one. Talk to your pediatrician regarding the use of this medicine in children. While this drug may be prescribed for selected conditions, precautions do apply. Overdosage: If you think you have taken too much of this medicine contact a poison control center or emergency room at once. NOTE: This medicine is only for you. Do not share this medicine with others. What if I miss a dose? It is important not to miss your dose. Call your doctor or health care professional if you miss your dose. If you miss a dose due to an On-body Injector failure or leakage, a new dose should be administered as soon as possible using a single prefilled syringe for manual use. What may interact with this medication? Interactions have not been studied. This list may not  describe all possible interactions. Give your health care provider a list of all the medicines, herbs, non-prescription drugs, or dietary supplements you use. Also tell them if you smoke, drink alcohol, or use illegal drugs. Some items may interact with your medicine. What should I watch for while using this medication? Your condition will be monitored carefully while you are receiving this medicine. You may need blood work done while you are taking this medicine. Talk to your health care provider about your risk of cancer. You may be more at risk for certain types of cancer if you take this medicine. If you are going to need a MRI, CT scan, or other procedure, tell your doctor that you are using this medicine (On-Body Injector only). What side effects may I notice from receiving this medication? Side effects that you should report to your doctor or health care professional as soon as possible: allergic reactions (skin rash, itching or hives, swelling of the face, lips, or tongue) back pain dizziness fever pain, redness, or irritation at site where injected pinpoint red spots on the skin red or dark-brown urine shortness of breath or breathing problems stomach or side pain, or pain at the shoulder swelling tiredness trouble passing urine or change in the amount of urine unusual bruising or bleeding Side effects  that usually do not require medical attention (report to your doctor or health care professional if they continue or are bothersome): bone pain muscle pain This list may not describe all possible side effects. Call your doctor for medical advice about side effects. You may report side effects to FDA at 1-800-FDA-1088. Where should I keep my medication? Keep out of the reach of children. If you are using this medicine at home, you will be instructed on how to store it. Throw away any unused medicine after the expiration date on the label. NOTE: This sheet is a summary. It may not  cover all possible information. If you have questions about this medicine, talk to your doctor, pharmacist, or health care provider.  2022 Elsevier/Gold Standard (2020-09-27 11:54:14)  Rehydration, Adult Rehydration is the replacement of body fluids, salts, and minerals (electrolytes) that are lost during dehydration. Dehydration is when there is not enough water or other fluids in the body. This happens when you lose more fluids than you take in. Common causes of dehydration include: Not drinking enough fluids. This can occur when you are ill or doing activities that require a lot of energy, especially in hot weather. Conditions that cause loss of water or other fluids, such as diarrhea, vomiting, sweating, or urinating a lot. Other illnesses, such as fever or infection. Certain medicines, such as those that remove excess fluid from the body (diuretics). Symptoms of mild or moderate dehydration may include thirst, dry lips and mouth, and dizziness. Symptoms of severe dehydration may include increased heart rate, confusion, fainting, and not urinating. For severe dehydration, you may need to get fluids through an IV at the hospital. For mild or moderate dehydration, you can usually rehydrate at home by drinking certain fluids as told by your health care provider. What are the risks? Generally, rehydration is safe. However, taking in too much fluid (overhydration) can be a problem. This is rare. Overhydration can cause an electrolyte imbalance, kidney failure, or a decrease in salt (sodium) levels in the body. Supplies needed You will need an oral rehydration solution (ORS) if your health care provider tells you to use one. This is a drink to treat dehydration. It can be found in pharmacies and retail stores. How to rehydrate Fluids Follow instructions from your health care provider for rehydration. The kind of fluid and the amount you should drink depend on your condition. In general, you should  choose drinks that you prefer. If told by your health care provider, drink an ORS. Make an ORS by following instructions on the package. Start by drinking small amounts, about  cup (120 mL) every 5-10 minutes. Slowly increase how much you drink until you have taken the amount recommended by your health care provider. Drink enough clear fluids to keep your urine pale yellow. If you were told to drink an ORS, finish it first, then start slowly drinking other clear fluids. Drink fluids such as: Water. This includes sparkling water and flavored water. Drinking only water can lead to having too little sodium in your body (hyponatremia). Follow the advice of your health care provider. Water from ice chips you suck on. Fruit juice with water you add to it (diluted). Sports drinks. Hot or cold herbal teas. Broth-based soups. Milk or milk products. Food Follow instructions from your health care provider about what to eat while you rehydrate. Your health care provider may recommend that you slowly begin eating regular foods in small amounts. Eat foods that contain a healthy balance of  electrolytes, such as bananas, oranges, potatoes, tomatoes, and spinach. Avoid foods that are greasy or contain a lot of sugar. In some cases, you may get nutrition through a feeding tube that is passed through your nose and into your stomach (nasogastric tube, or NG tube). This may be done if you have uncontrolled vomiting or diarrhea. Beverages to avoid Certain beverages may make dehydration worse. While you rehydrate, avoid drinking alcohol. How to tell if you are recovering from dehydration You may be recovering from dehydration if: You are urinating more often than before you started rehydrating. Your urine is pale yellow. Your energy level improves. You vomit less frequently. You have diarrhea less frequently. Your appetite improves or returns to normal. You feel less dizzy or less light-headed. Your skin tone  and color start to look more normal. Follow these instructions at home: Take over-the-counter and prescription medicines only as told by your health care provider. Do not take sodium tablets. Doing this can lead to having too much sodium in your body (hypernatremia). Contact a health care provider if: You continue to have symptoms of mild or moderate dehydration, such as: Thirst. Dry lips. Slightly dry mouth. Dizziness. Dark urine or less urine than normal. Muscle cramps. You continue to vomit or have diarrhea. Get help right away if you: Have symptoms of dehydration that get worse. Have a fever. Have a severe headache. Have been vomiting and the following happens: Your vomiting gets worse or does not go away. Your vomit includes blood or green matter (bile). You cannot eat or drink without vomiting. Have problems with urination or bowel movements, such as: Diarrhea that gets worse or does not go away. Blood in your stool (feces). This may cause stool to look black and tarry. Not urinating, or urinating only a small amount of very dark urine, within 6-8 hours. Have trouble breathing. Have symptoms that get worse with treatment. These symptoms may represent a serious problem that is an emergency. Do not wait to see if the symptoms will go away. Get medical help right away. Call your local emergency services (911 in the [place] Do not drive yourself to the hospital. Summary Rehydration is the replacement of body fluids and minerals (electrolytes) that are lost during dehydration. Follow instructions from your health care provider for rehydration. The kind of fluid and amount you should drink depend on your condition. Slowly increase how much you drink until you have taken the amount recommended by your health care provider. Contact your health care provider if you continue to show signs of mild or moderate dehydration. This information is not intended to replace advice given to you by  your health care provider. Make sure you discuss any questions you have with your health care provider. Document Revised: 11/01/2019 Document Reviewed: 09/11/2019 Elsevier Patient Education  2022 [employer].

## 2021-05-29 ENCOUNTER — Telehealth: Payer: Self-pay

## 2021-05-29 NOTE — Telephone Encounter (Signed)
This nurse returned call related to a message left by the patient wanting to verify his next scheduled infusion date.  This nurse left a message for patient with next scheduled appointments for 9/22 and pump d/c on 9/24.  Patient knows to call clinic if there are any further questions or concerns.

## 2021-06-04 MED FILL — Fosaprepitant Dimeglumine For IV Infusion 150 MG (Base Eq): INTRAVENOUS | Qty: 5 | Status: AC

## 2021-06-05 ENCOUNTER — Inpatient Hospital Stay: Payer: BC Managed Care – PPO

## 2021-06-05 ENCOUNTER — Inpatient Hospital Stay (HOSPITAL_BASED_OUTPATIENT_CLINIC_OR_DEPARTMENT_OTHER): Payer: BC Managed Care – PPO | Admitting: Hematology

## 2021-06-05 ENCOUNTER — Encounter: Payer: Self-pay | Admitting: Hematology

## 2021-06-05 ENCOUNTER — Other Ambulatory Visit: Payer: Self-pay

## 2021-06-05 VITALS — BP 128/85 | HR 96 | Temp 98.3°F | Resp 19 | Ht 71.0 in | Wt 259.4 lb

## 2021-06-05 DIAGNOSIS — C182 Malignant neoplasm of ascending colon: Secondary | ICD-10-CM

## 2021-06-05 DIAGNOSIS — C772 Secondary and unspecified malignant neoplasm of intra-abdominal lymph nodes: Secondary | ICD-10-CM

## 2021-06-05 DIAGNOSIS — Z95828 Presence of other vascular implants and grafts: Secondary | ICD-10-CM

## 2021-06-05 LAB — CMP (CANCER CENTER ONLY)
ALT: 31 U/L (ref 0–44)
AST: 22 U/L (ref 15–41)
Albumin: 4 g/dL (ref 3.5–5.0)
Alkaline Phosphatase: 137 U/L — ABNORMAL HIGH (ref 38–126)
Anion gap: 10 (ref 5–15)
BUN: 18 mg/dL (ref 6–20)
CO2: 23 mmol/L (ref 22–32)
Calcium: 9.7 mg/dL (ref 8.9–10.3)
Chloride: 106 mmol/L (ref 98–111)
Creatinine: 1.21 mg/dL (ref 0.61–1.24)
GFR, Estimated: >60 mL/min (ref >60)
Glucose, Bld: 116 mg/dL — ABNORMAL HIGH (ref 70–99)
Potassium: 4.1 mmol/L (ref 3.5–5.1)
Sodium: 139 mmol/L (ref 135–145)
Total Bilirubin: 0.5 mg/dL (ref 0.3–1.2)
Total Protein: 7.9 g/dL (ref 6.5–8.1)

## 2021-06-05 LAB — CBC WITH DIFFERENTIAL (CANCER CENTER ONLY)
Abs Immature Granulocytes: 0.05 10*3/uL (ref 0.00–0.07)
Basophils Absolute: 0 10*3/uL (ref 0.0–0.1)
Basophils Relative: 0 %
Eosinophils Absolute: 0.1 10*3/uL (ref 0.0–0.5)
Eosinophils Relative: 1 %
HCT: 43.6 % (ref 39.0–52.0)
Hemoglobin: 14.7 g/dL (ref 13.0–17.0)
Immature Granulocytes: 1 %
Lymphocytes Relative: 13 %
Lymphs Abs: 1.3 10*3/uL (ref 0.7–4.0)
MCH: 30.9 pg (ref 26.0–34.0)
MCHC: 33.7 g/dL (ref 30.0–36.0)
MCV: 91.8 fL (ref 80.0–100.0)
Monocytes Absolute: 0.5 10*3/uL (ref 0.1–1.0)
Monocytes Relative: 5 %
Neutro Abs: 7.9 10*3/uL — ABNORMAL HIGH (ref 1.7–7.7)
Neutrophils Relative %: 80 %
Platelet Count: 250 10*3/uL (ref 150–400)
RBC: 4.75 MIL/uL (ref 4.22–5.81)
RDW: 14.8 % (ref 11.5–15.5)
WBC Count: 9.9 10*3/uL (ref 4.0–10.5)
nRBC: 0 % (ref 0.0–0.2)

## 2021-06-05 MED ORDER — LORATADINE 10 MG PO TABS
10.0000 mg | ORAL_TABLET | Freq: Once | ORAL | Status: AC
  Administered 2021-06-05: 10 mg via ORAL
  Filled 2021-06-05: qty 1

## 2021-06-05 MED ORDER — FAMOTIDINE 20 MG IN NS 100 ML IVPB
20.0000 mg | Freq: Once | INTRAVENOUS | Status: AC
  Administered 2021-06-05: 20 mg via INTRAVENOUS
  Filled 2021-06-05: qty 100

## 2021-06-05 MED ORDER — SODIUM CHLORIDE 0.9 % IV SOLN: INTRAVENOUS | Status: DC

## 2021-06-05 MED ORDER — SODIUM CHLORIDE 0.9 % IV SOLN
400.0000 mg/m2 | Freq: Once | INTRAVENOUS | Status: AC
  Administered 2021-06-05: 936 mg via INTRAVENOUS
  Filled 2021-06-05: qty 46.8

## 2021-06-05 MED ORDER — SODIUM CHLORIDE 0.9 % IV SOLN: Freq: Once | INTRAVENOUS | Status: AC

## 2021-06-05 MED ORDER — ATROPINE SULFATE 1 MG/ML IJ SOLN
0.5000 mg | Freq: Once | INTRAMUSCULAR | Status: AC | PRN
  Administered 2021-06-05: 0.5 mg via INTRAVENOUS
  Filled 2021-06-05: qty 1

## 2021-06-05 MED ORDER — SODIUM CHLORIDE 0.9% FLUSH
10.0000 mL | Freq: Once | INTRAVENOUS | Status: AC
  Administered 2021-06-05: 10 mL

## 2021-06-05 MED ORDER — PALONOSETRON HCL INJECTION 0.25 MG/5ML
0.2500 mg | Freq: Once | INTRAVENOUS | Status: AC
  Administered 2021-06-05: 0.25 mg via INTRAVENOUS
  Filled 2021-06-05: qty 5

## 2021-06-05 MED ORDER — SODIUM CHLORIDE 0.9 % IV SOLN
180.0000 mg/m2 | Freq: Once | INTRAVENOUS | Status: AC
  Administered 2021-06-05: 420 mg via INTRAVENOUS
  Filled 2021-06-05: qty 15

## 2021-06-05 MED ORDER — FOSAPREPITANT DIMEGLUMINE INJECTION 150 MG
150.0000 mg | Freq: Once | INTRAVENOUS | Status: AC
  Administered 2021-06-05: 150 mg via INTRAVENOUS
  Filled 2021-06-05: qty 150

## 2021-06-05 MED ORDER — LEUCOVORIN CALCIUM INJECTION 350 MG: 400.0000 mg/m2 | Freq: Once | INTRAVENOUS | Status: DC

## 2021-06-05 MED ORDER — METHYLPREDNISOLONE SODIUM SUCC 125 MG IJ SOLR
125.0000 mg | Freq: Once | INTRAMUSCULAR | Status: AC
  Administered 2021-06-05: 125 mg via INTRAVENOUS
  Filled 2021-06-05: qty 2

## 2021-06-05 MED ORDER — SODIUM CHLORIDE 0.9 % IV SOLN
2800.0000 mg/m2 | INTRAVENOUS | Status: DC
  Administered 2021-06-05: 6550 mg via INTRAVENOUS
  Filled 2021-06-05: qty 131

## 2021-06-05 NOTE — Progress Notes (Signed)
[place]   [phone] Fax:([phone]   Clinic Follow up Note   Patient Care Team: [name], MD as PCP - General (Internal Medicine) [name], MD as Consulting Physician (Oncology) [name], RN (Inactive) as Oncology Nurse Navigator  Date of Service:  06/05/2021  CHIEF COMPLAINT: f/u of metastatic colon cancer  CURRENT THERAPY:  First line FOLFOX q2 weeks starting 12/18/20. Bevacizumab added with cycle 2. Added Irinotecan from C6 on 03/13/21. Oxaliplatin discontinued after C7  ASSESSMENT & PLAN:  Ricardo [name] is a 55 y.o. male with   1. 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 Work up showed mass in the terminal ileum with extensive omental nodularity and LN involvement. His baseline tumor marker CA 19-9 and CEA were normal. 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 This confirms stage IV metastatic cancer from cecum -He started first-line palliative chemo with FOLFOX on 12/18/20.  -FO shows KRAS/NRAS wildtype, but given his right side colon cancer, we do not use EGFR inhibitor in first line.  -Escalated to [facility] with C6.  He had a recurrent allergy reaction (skin rash and throat tightness) to oxaliplatin, which was subsequently stopped after cycle 7. -He underwent laparoscopy on 05/13/21 to determine if he is a candidate for CRS-HIPEC. Unfortunately, due to his extensive peritoneal metastasis,  he is not a candidate.  -We discussed that we will continue his current treatment as long as he tolerates and his cancer is controlled.  We discussed immunotherapy and that his disease is MSI stable, meaning he would not benefit from immunotherapy. -He continues to tolerate FOLFIRI and bevacizumab very well, no diarrhea or other noticeable side effects -He is clinically doing well. Labs reviewed, CBC WNL, CMP pending. Overall adequate to proceed with treatment today. We will hold bevacizumab due to his healing wound.   2. Symptom Management: Constipation, lower abdominal pain, abdominal bloating secondary #1 and partial bowl obstruction, Herniated Rectum -For 8-9 weeks before hospitalization he was having increasing lower abdominal pain and constipation -He did have G-tube placed with surgery on 11/29/20. He has not needed to use this since starting chemo. -He also reports receiving IV fluids following pump removal has helped tremendously. -He has gained weight back and is now just above his baseline weight.   3. Genetic testing -He has brother with prostate cancer and sister with breast cancer in their 73s. -Genetics counseling and labs on 12/25/20, results negative.   4. Social Support -He is an avid cyclist. Last long distant riding was 06/2020 across [place]. He has finally been able to get back to biking (in 02/2021) since his diagnosis. -He notes he has good family support. He is married with 1 adult son. His brother Jenny Reichmann is in town. He works as a Designer, television/film set at [employer]. He may take time off work based on how to tolerate chemotherapy. -He was previously offered [facility] resources including chaplin and SW if needed.    5. Covid-19+ -Positive on at-home test on 01/20/21 -Received bebtelovimab on 01/24/21 -has recovered completely      Plan: -Proceed with FOLFIRI today with 1L IVF and Udenyca on day 3.  Continue holding bevacizumab due to open wound  -labs, f/u, and next FOLFIRI in 2 and 4 weeks, with 2hr IVF and Udenyca on day 3   No problem-specific Assessment & Plan notes found for this encounter.   SUMMARY OF  ONCOLOGIC HISTORY: Oncology History Overview Note  Cancer Staging metastatic cecal cancer Staging form: Colon and Rectum, AJCC 8th Edition - Clinical stage from 11/29/2020: Stage IVC (cTX, cN2, pM1c) - Signed by [name], MD on 12/04/2020 Stage prefix: Initial diagnosis Histologic grade (G): G3 Histologic grading system: 4 grade system    metastatic cecal cancer  11/27/2020 Imaging   CT Angio CAP  IMPRESSION: 1. Thickening of the terminal ileum with associated proximal mid to distal small bowel obstruction. Findings could be due to an ileitis versus malignancy. Nonspecific mesenteric edema could be due to engorgement versus metastases. No associated bowel perforation. Recommend endoscopy for further evaluation. 2. Indeterminate right lower quadrant lymphadenopathy. 3. Scattered colonic diverticulosis with no acute diverticulitis. 4. Stable right hepatic lobe subcentimeter hyperdensity likely represents a hepatic hemangioma. 5. No acute vascular abnormality. Aortic Atherosclerosis (ICD10-I70.0) - mild. 6. No acute intrathoracic abnormality.   11/28/2020 Imaging   CT AP  IMPRESSION: 1. There is masslike, circumferential thickening of the terminal ileum and cecal base near the ileocecal valve and abnormally enlarged lymph nodes in the right lower quadrant mesentery adjacent to the terminal ileum measuring up to 2.3 x 1.4 cm. 2. There is extensive omental and peritoneal nodularity and caking throughout the abdomen. 3. Findings are highly concerning for primary colon malignancy with nodal and peritoneal metastatic disease. 4. Small volume perihepatic and perisplenic ascites. 5. The small bowel is generally decompressed, with full some fluid-filled, nondistended loops throughout. There is transit of oral enteric contrast to the terminal ileum. No evidence of overt bowel obstruction at this time. Esophagogastric tube is position with tip and side port below the diaphragm. 6.  Atelectasis or consolidation of the dependent bilateral lung bases, new compared to prior examination.   Aortic Atherosclerosis (ICD10-I70.0).     11/29/2020 Surgery   EXPLORATORY LAPAROTOMY, PARTIAL BOWEL RESECTION, POSSIBLE OSTOMY CREATION, PERITIONEAL BIOPSY, INSERTION OF GASTROSTOMY TUBE by Dr [name]   11/29/2020 Initial Biopsy   FINAL MICROSCOPIC DIAGNOSIS:   AB. 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    COMMENT:   Immunohistochemistry (IHC) for CK20 and CDX-2 is strong and diffusely  positive.  CK7, TTF-1, Synaptophysin, Chromogranin and CD56 are  negative.  The immunophenotype is compatible with origin from the lower  gastrointestinal tract.  IHC for MMR will be reported separately.  Case  preliminarily discussed with Dr. [name] on 12/02/2020.   At the request of Dr. [name], [phone] was reviewed in retrospect.  Review of the submitted sections confirms the presence of acute  appendicitis.  No malignancy is identified.    11/29/2020 Cancer Staging   Staging form: Colon and Rectum, AJCC 8th Edition - Clinical stage from 11/29/2020: Stage IVC (cTX, cN2, pM1c) - Signed by [name], MD on 12/04/2020 Stage prefix: Initial diagnosis Histologic grade (G): G3 Histologic grading system: 4 grade system   12/04/2020 Initial Diagnosis   Cancer of ascending colon metastatic to intra-abdominal lymph node ([place])    Chemotherapy   FOLFOX q2weeks    12/18/2020 - 02/15/2021 Chemotherapy      Patient is on Antibody Plan: COLORECTAL BEVACIZUMAB Q14D     01/03/2021 Imaging   CT A/P IMPRESSION: 1. Wall thickening  of the terminal ileum again seen. Fluid-filled distal small bowel, ascending, and transverse colon without obstruction. 2. Equivocal improvement in omental and peritoneal metastatic disease with slightly improved small volume perihepatic and perisplenic ascites. Right lower quadrant  mesenteric adenopathy is similar or mildly improved. 3. Sigmoid colonic diverticulosis. Mild mural wall thickening in the sigmoid, no acute diverticulitis. 4. Gastrostomy tube in place, balloon normally positioned in the stomach. 5. Chronic bilateral L5 pars interarticularis defects with trace anterolisthesis of L5 on S1. Chronic avascular necrosis of the left femoral head.   01/16/2021 Genetic Testing   Negative genetic testing. CTNNA1 [id number] VUS, RAD51D p.G140E VUS and TSC1 p.R768H VUS found on the CancerNext-Expanded+RNAinsight.  The CancerNext-Expanded gene panel offered by [facility] and includes sequencing and rearrangement analysis for the following 77 genes: AIP, ALK, APC*, ATM*, AXIN2, BAP1, BARD1, BLM, BMPR1A, BRCA1*, BRCA2*, BRIP1*, CDC73, CDH1*, CDK4, CDKN1B, CDKN2A, CHEK2*, CTNNA1, DICER1, FANCC, FH, FLCN, GALNT12, KIF1B, LZTR1, MAX, MEN1, MET, MLH1*, MSH2*, MSH3, MSH6*, MUTYH*, NBN, NF1*, NF2, NTHL1, PALB2*, PHOX2B, PMS2*, POT1, PRKAR1A, PTCH1, PTEN*, RAD51C*, RAD51D*, RB1, RECQL, RET, SDHA, SDHAF2, SDHB, SDHC, SDHD, SMAD4, SMARCA4, SMARCB1, SMARCE1, STK11, SUFU, TMEM127, TP53*, TSC1, TSC2, VHL and XRCC2 (sequencing and deletion/duplication); EGFR, EGLN1, HOXB13, KIT, MITF, PDGFRA, POLD1, and POLE (sequencing only); EPCAM and GREM1 (deletion/duplication only). DNA and RNA analyses performed for * genes. The report date is Jan 16, 2021.   02/24/2021 Imaging   CT AP  IMPRESSION: 1. Interval resolution of previously seen small volume ascites throughout the abdomen and pelvis. There is redemonstrated, ill-defined stranding and thickening of the peritoneal surfaces and omentum, which is somewhat diminished compared to prior examination. 2. Interval improvement in right lower quadrant mesocolon lymph nodes. 3. Findings are consistent with treatment response of nodal and peritoneal metastatic disease. 4. Unchanged, matted appearing thickening of the terminal ileum and cecal  base. 5. Sigmoid diverticulosis with wall thickening of the mid to distal sigmoid, similar to prior examination. Findings are most suggestive of chronic sequelae of diverticulitis.   Aortic Atherosclerosis (ICD10-I70.0).   03/13/2021 -  Chemotherapy    Patient is on Treatment Plan: COLORECTAL FOLFOXIRI + BEVACIZUMAB Q14D       05/13/2021 Procedure   OPERATIVE PROCEDURE: Laparoscopy and peritoneal biopsy.  SURGEON: Merlyn Albert. Clovis Riley, MD   05/13/2021 Pathology Results   Final Pathologic Diagnosis    A. PERITONEAL, BIOPSY :              Metastatic adenocarcinoma.               See comment.    Comment    The biopsies show predominately fibrosis  and fibroadipose tissue with a small focus of moderately differentiated adenocarcinoma. Given the patient's history, the findings favor metastasis of the patient's known colonic cancer.        INTERVAL HISTORY:  Ricardo [name] is here for a follow up of metastatic colon cancer. He was last seen by me on 05/22/21. He presents to the clinic alone. He reports one of his incisions is healing slowly. He also reports he has had the best two weeks he's had in a while. He notes he thinks he ate something that didn't agree with his stomach and caused some brief issues.  He reports he is eating well and is concerned he is gaining too much weight. He reports he still struggles with constipation and notes that milk is helpful in softening his stool. 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 He notes the event will be a fundraising bicycling event in 01/2022; this will be his second annual event. 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 He notes he continues to work Monday through Wednesday   All other systems were reviewed with the patient and are negative.  MEDICAL HISTORY:  Past Medical History:  Diagnosis Date   Arthritis    Colon cancer ([place])     with metastasis   Family history of adverse reaction to anesthesia    mother had problem with it due to her asthma   Family history of breast cancer    Family history of prostate cancer     SURGICAL HISTORY: Past Surgical History:  Procedure Laterality Date   BOWEL RESECTION N/A 11/29/2020   Procedure: PARTIAL BOWEL RESECTION;  Surgeon: Jovita Kussmaul, MD;  Location: [place];  Service: General;  Laterality: N/A;   GASTROSTOMY Left 11/29/2020   Procedure: INSERTION OF GASTROSTOMY TUBE;  Surgeon: Jovita Kussmaul, MD;  Location: [place];  Service: General;  Laterality: Left;   IR IMAGING GUIDED PORT INSERTION  12/12/2020   LAPAROSCOPIC APPENDECTOMY N/A 01/17/2019   Procedure: APPENDECTOMY LAPAROSCOPIC;  Surgeon: Ralene Ok, MD;  Location: [place];  Service: General;  Laterality: N/A;   LAPAROTOMY N/A 11/29/2020   Procedure: EXPLORATORY LAPAROTOMY;  Surgeon: Jovita Kussmaul, MD;  Location: [place];  Service: General;  Laterality: N/A;  PUT CASE IN ROOM 1 STARTING AT 9:30AM FOR 120 MIN   OSTOMY N/A 11/29/2020   Procedure: POSSIBLE OSTOMY CREATION;  Surgeon: Jovita Kussmaul, MD;  Location: [place];  Service: General;  Laterality: N/A;   RECTAL BIOPSY N/A 11/29/2020   Procedure: PERITIONEAL BIOPSY;  Surgeon: Jovita Kussmaul, MD;  Location: [place];  Service: General;  Laterality: N/A;   SHOULDER SURGERY Left 09/19/2015    I have reviewed the social history and family history with the patient and they are unchanged from previous note.  ALLERGIES:  is allergic to oxaliplatin.  MEDICATIONS:  Current Outpatient Medications  Medication Sig Dispense Refill   acetaminophen (TYLENOL) 500 MG tablet Take 1,000 mg by mouth every 6 (six) hours as needed for mild pain.     dicyclomine (BENTYL) 10 MG capsule TAKE 1 CAPSULE (10 MG TOTAL) BY MOUTH 4 (FOUR) TIMES DAILY - BEFORE MEALS AND AT BEDTIME. 120 capsule 1   diphenoxylate-atropine (LOMOTIL) 2.5-0.025 MG tablet Take 2 tablets by mouth 4 (four) times daily as  needed for diarrhea or loose stools. 60 tablet 1   hydrocortisone (ANUSOL-HC) 2.5 % rectal cream Place 1 application rectally 2 (two) times daily. 30 g 1   ondansetron (ZOFRAN) 8 MG tablet TAKE 1 TABLET BY MOUTH 2TIMES DAILY AS NEEDED FOR NAUSEA/VOMITING. START ON DAY3 AFTER CHEMOTHERAPY.     oxyCODONE (OXY IR/ROXICODONE) 5 MG immediate release tablet Take 1 tablet (5 mg total) by mouth every 6 (six) hours as needed for severe pain. 60 tablet 0   prochlorperazine (COMPAZINE) 10 MG tablet Take 1 tablet (10 mg total) by mouth every 6 (six) hours as needed for nausea or vomiting. 30 tablet 2   zolpidem (AMBIEN) 5 MG tablet Take 1 tablet (5 mg total) by mouth at bedtime as needed for sleep. 30 tablet 0   Current Facility-Administered Medications  Medication Dose Route Frequency Provider Last Rate Last Admin   0.9 %  sodium chloride infusion   Intravenous PRN Causey, Charlestine Massed,  NP        PHYSICAL EXAMINATION: ECOG PERFORMANCE STATUS: 1 - Symptomatic but completely ambulatory  Vitals:   06/05/21 0953  BP: 128/85  Pulse: 96  Resp: 19  Temp: 98.3 F (36.8 C)  SpO2: 97%   Wt Readings from Last 3 Encounters:  06/05/21 259 lb 6.4 oz (117.7 kg)  05/22/21 257 lb (116.6 kg)  04/24/21 252 lb 14.4 oz (114.7 kg)     GENERAL:alert, no distress and comfortable SKIN: skin color, texture, turgor are normal, no rashes or significant lesions, (+) 1 cm open wound in right upper quadrant from previous microscopic surgery, no discharge, minimal erythema EYES: normal, Conjunctiva are pink and non-injected, sclera clear  NEURO: alert & oriented x 3 with fluent speech, no focal motor/sensory deficits  LABORATORY DATA:  I have reviewed the data as listed CBC Latest Ref Rng & Units 06/05/2021 05/22/2021 04/24/2021  WBC 4.0 - 10.5 K/uL 9.9 9.5 7.9  Hemoglobin 13.0 - 17.0 g/dL 14.7 13.9 14.3  Hematocrit 39.0 - 52.0 % 43.6 40.6 42.5  Platelets 150 - 400 K/uL 250 277 187     CMP Latest Ref Rng & Units  06/05/2021 05/22/2021 04/24/2021  Glucose 70 - 99 mg/dL 116(H) 104(H) 94  BUN 6 - 20 mg/dL '18 19 14  ' Creatinine 0.61 - 1.24 mg/dL 1.21 1.19 1.26(H)  Sodium 135 - 145 mmol/L 139 138 141  Potassium 3.5 - 5.1 mmol/L 4.1 4.1 4.4  Chloride 98 - 111 mmol/L 106 103 107  CO2 22 - 32 mmol/L '23 24 25  ' Calcium 8.9 - 10.3 mg/dL 9.7 9.4 9.5  Total Protein 6.5 - 8.1 g/dL 7.9 7.6 7.7  Total Bilirubin 0.3 - 1.2 mg/dL 0.5 0.6 0.6  Alkaline Phos 38 - 126 U/L 137(H) 102 122  AST 15 - 41 U/L '22 17 21  ' ALT 0 - 44 U/L '31 19 31      ' RADIOGRAPHIC STUDIES: I have personally reviewed the radiological images as listed and agreed with the findings in the report. No results found.    No orders of the defined types were placed in this encounter.  All questions were answered. The patient knows to call the clinic with any problems, questions or concerns. No barriers to learning was detected. The total time spent in the appointment was 30 minutes.     [name], MD 06/05/2021   I, Wilburn Mylar, am acting as scribe for [name], MD.   I have reviewed the above documentation for accuracy and completeness, and I agree with the above.

## 2021-06-05 NOTE — Progress Notes (Signed)
Ok per Dr. Burr Medico to adjust 5FU pump to 46 hours in order for him to have it D/Cd Saturday

## 2021-06-07 ENCOUNTER — Inpatient Hospital Stay: Payer: BC Managed Care – PPO

## 2021-06-07 ENCOUNTER — Other Ambulatory Visit: Payer: Self-pay

## 2021-06-07 DIAGNOSIS — C182 Malignant neoplasm of ascending colon: Secondary | ICD-10-CM

## 2021-06-07 MED ORDER — SODIUM CHLORIDE 0.9 % IV SOLN: INTRAVENOUS | Status: DC

## 2021-06-07 MED ORDER — PEGFILGRASTIM-CBQV 6 MG/0.6ML ~~LOC~~ SOSY
PREFILLED_SYRINGE | SUBCUTANEOUS | Status: AC
  Filled 2021-06-07: qty 0.6

## 2021-06-07 MED ORDER — PEGFILGRASTIM-CBQV 6 MG/0.6ML ~~LOC~~ SOSY
6.0000 mg | PREFILLED_SYRINGE | Freq: Once | SUBCUTANEOUS | Status: AC
  Administered 2021-06-07: 6 mg via SUBCUTANEOUS

## 2021-06-18 MED FILL — Fosaprepitant Dimeglumine For IV Infusion 150 MG (Base Eq): INTRAVENOUS | Qty: 5 | Status: AC

## 2021-06-18 NOTE — Progress Notes (Signed)
[place]   Telephone:([phone] Fax:(336) [phone]   Clinic Follow up Note   Patient Care Team: [name], MD as PCP - General (Internal Medicine) [name], MD as Consulting Physician (Oncology) [name], RN (Inactive) as Oncology Nurse Navigator 06/19/2021  CHIEF COMPLAINT: Follow up metastatic colon cancer   SUMMARY OF ONCOLOGIC HISTORY: Oncology History Overview Note  Cancer Staging metastatic cecal cancer Staging form: Colon and Rectum, AJCC 8th Edition - Clinical stage from 11/29/2020: Stage IVC (cTX, cN2, pM1c) - Signed by [name], MD on 12/04/2020 Stage prefix: Initial diagnosis Histologic grade (G): G3 Histologic grading system: 4 grade system    metastatic cecal cancer  11/27/2020 Imaging   CT Angio CAP  IMPRESSION: 1. Thickening of the terminal ileum with associated proximal mid to distal small bowel obstruction. Findings could be due to an ileitis versus malignancy. Nonspecific mesenteric edema could be due to engorgement versus metastases. No associated bowel perforation. Recommend endoscopy for further evaluation. 2. Indeterminate right lower quadrant lymphadenopathy. 3. Scattered colonic diverticulosis with no acute diverticulitis. 4. Stable right hepatic lobe subcentimeter hyperdensity likely represents a hepatic hemangioma. 5. No acute vascular abnormality. Aortic Atherosclerosis (ICD10-I70.0) - mild. 6. No acute intrathoracic abnormality.   11/28/2020 Imaging   CT AP  IMPRESSION: 1. There is masslike, circumferential thickening of the terminal ileum and cecal base near the ileocecal valve and abnormally enlarged lymph nodes in the right lower quadrant mesentery adjacent to the terminal ileum measuring up to 2.3 x 1.4 cm. 2. There is extensive omental and peritoneal nodularity and caking throughout the abdomen. 3. Findings are highly concerning for primary colon malignancy with nodal and peritoneal metastatic  disease. 4. Small volume perihepatic and perisplenic ascites. 5. The small bowel is generally decompressed, with full some fluid-filled, nondistended loops throughout. There is transit of oral enteric contrast to the terminal ileum. No evidence of overt bowel obstruction at this time. Esophagogastric tube is position with tip and side port below the diaphragm. 6. Atelectasis or consolidation of the dependent bilateral lung bases, new compared to prior examination.   Aortic Atherosclerosis (ICD10-I70.0).     11/29/2020 Surgery   EXPLORATORY LAPAROTOMY, PARTIAL BOWEL RESECTION, POSSIBLE OSTOMY CREATION, PERITIONEAL BIOPSY, INSERTION OF GASTROSTOMY TUBE by Dr [name]   11/29/2020 Initial Biopsy   FINAL MICROSCOPIC DIAGNOSIS:   AB. 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    COMMENT:   Immunohistochemistry (IHC) for CK20 and CDX-2 is strong and diffusely  positive.  CK7, TTF-1, Synaptophysin, Chromogranin and CD56 are  negative.  The immunophenotype is compatible with origin from the lower  gastrointestinal tract.  IHC for MMR will be reported separately.  Case  preliminarily discussed with Dr. [name] on 12/02/2020.   At the request of Dr. [name], [phone] was reviewed in retrospect.  Review of the submitted sections confirms the presence of acute  appendicitis.  No malignancy is identified.    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 Wall thickening of the terminal ileum again seen. Fluid-filled distal small bowel, ascending, and transverse colon without obstruction. 2. Equivocal improvement in omental and peritoneal metastatic disease with slightly improved small volume perihepatic and perisplenic ascites. Right lower quadrant mesenteric adenopathy is similar or mildly improved. 3. Sigmoid colonic diverticulosis. Mild mural wall thickening in the sigmoid, no acute diverticulitis. 4. Gastrostomy tube in place, balloon normally positioned in the stomach. 5. Chronic bilateral L5 pars interarticularis defects with trace anterolisthesis of L5 on S1. Chronic avascular necrosis of the left femoral head.   01/16/2021 Genetic Testing   Negative genetic testing. CTNNA1 [id number] VUS, RAD51D p.G140E VUS and TSC1 p.R768H VUS found on the CancerNext-Expanded+RNAinsight.  The CancerNext-Expanded gene panel offered by [facility] and includes sequencing and rearrangement analysis for the following 77 genes: AIP, ALK, APC*, ATM*, AXIN2, BAP1, BARD1, BLM, BMPR1A, BRCA1*, BRCA2*, BRIP1*, CDC73, CDH1*, CDK4, CDKN1B, CDKN2A, CHEK2*, CTNNA1, DICER1, FANCC, FH, FLCN, GALNT12, KIF1B, LZTR1, MAX, MEN1, MET, MLH1*, MSH2*, MSH3, MSH6*, MUTYH*, NBN, NF1*, NF2, NTHL1, PALB2*, PHOX2B, PMS2*, POT1, PRKAR1A, PTCH1, PTEN*, RAD51C*, RAD51D*, RB1, RECQL, RET, SDHA, SDHAF2, SDHB, SDHC, SDHD, SMAD4, SMARCA4, SMARCB1, SMARCE1, STK11, SUFU, TMEM127, TP53*, TSC1, TSC2, VHL and XRCC2 (sequencing and deletion/duplication); EGFR, EGLN1, HOXB13, KIT, MITF, PDGFRA, POLD1, and POLE (sequencing only); EPCAM and GREM1 (deletion/duplication only). DNA and RNA analyses performed for * genes. The report date is Jan 16, 2021.   02/24/2021 Imaging   CT AP  IMPRESSION: 1. Interval resolution of previously seen small volume ascites throughout the abdomen and pelvis. There is  redemonstrated, ill-defined stranding and thickening of the peritoneal surfaces and omentum, which is somewhat diminished compared to prior examination. 2. Interval improvement in right lower quadrant mesocolon lymph nodes. 3. Findings are consistent with treatment response of nodal and peritoneal metastatic disease. 4. Unchanged, matted appearing thickening of the terminal ileum and cecal base. 5. Sigmoid diverticulosis with wall thickening of the mid to distal sigmoid, similar to prior examination. Findings are most suggestive of chronic sequelae of diverticulitis.   Aortic Atherosclerosis (ICD10-I70.0).   03/13/2021 -  Chemotherapy   Patient is on Treatment Plan : COLORECTAL FOLFOXIRI + Bevacizumab q14d     05/13/2021 Procedure   OPERATIVE PROCEDURE: Laparoscopy and peritoneal biopsy.  SURGEON: Merlyn Albert. Clovis Riley, MD   05/13/2021 Pathology Results   Final Pathologic Diagnosis    A. PERITONEAL, BIOPSY :              Metastatic adenocarcinoma.               See comment.    Comment    The biopsies show predominately fibrosis  and fibroadipose tissue with a small focus of moderately differentiated adenocarcinoma. Given the patient's history, the findings favor metastasis of the patient's known colonic cancer.       CURRENT THERAPY: First line FOLFOX q2 weeks starting 12/18/20. Bevacizumab added with cycle 2. Added Irinotecan from C6 on 03/13/21. Oxaliplatin discontinued after C7  INTERVAL HISTORY: Mr. Kimberley returns for follow up as scheduled. Last seen 06/05/21 and completed another cycle of FOLFIRI. Beva is on hold for open wound which is healing slowly.  After treatment he sleeps for the next day, activity increases by Saturday, he attends church on Sunday, and begins working by Monday.  The Wednesday after treatment he feels normal for the next week.  He has constipation after treatment that gradually normalizes.  He had an  infusion of "high-dose vitamin C" from a third-party, this  caused his bowels to take longer to recover.  Milk and liquids have helped coat his stomach, improve his bowels, and ease cramps.  He tolerated a salad for the first time in a while.  Has mild nausea without vomiting on day 3.  He has intermittent bloating but no abdominal pain.  Neuropathy is more noticeable, functioning well without difficulty, not dropping things.  All other systems were reviewed with the patient and are negative.  MEDICAL HISTORY:  Past Medical History:  Diagnosis Date   Arthritis    Colon cancer ([place])    with metastasis   Family history of adverse reaction to anesthesia    mother had problem with it due to her asthma   Family history of breast cancer    Family history of prostate cancer     SURGICAL HISTORY: Past Surgical History:  Procedure Laterality Date   BOWEL RESECTION N/A 11/29/2020   Procedure: PARTIAL BOWEL RESECTION;  Surgeon: Jovita Kussmaul, MD;  Location: [place];  Service: General;  Laterality: N/A;   GASTROSTOMY Left 11/29/2020   Procedure: INSERTION OF GASTROSTOMY TUBE;  Surgeon: Jovita Kussmaul, MD;  Location: [place];  Service: General;  Laterality: Left;   IR IMAGING GUIDED PORT INSERTION  12/12/2020   LAPAROSCOPIC APPENDECTOMY N/A 01/17/2019   Procedure: APPENDECTOMY LAPAROSCOPIC;  Surgeon: Ralene Ok, MD;  Location: [place];  Service: General;  Laterality: N/A;   LAPAROTOMY N/A 11/29/2020   Procedure: EXPLORATORY LAPAROTOMY;  Surgeon: Jovita Kussmaul, MD;  Location: [place];  Service: General;  Laterality: N/A;  PUT CASE IN ROOM 1 STARTING AT 9:30AM FOR 120 MIN   OSTOMY N/A 11/29/2020   Procedure: POSSIBLE OSTOMY CREATION;  Surgeon: Jovita Kussmaul, MD;  Location: [place];  Service: General;  Laterality: N/A;   RECTAL BIOPSY N/A 11/29/2020   Procedure: PERITIONEAL BIOPSY;  Surgeon: Jovita Kussmaul, MD;  Location: [place];  Service: General;  Laterality: N/A;   SHOULDER SURGERY Left 09/19/2015    I have reviewed the social history and family history with the  patient and they are unchanged from previous note.  ALLERGIES:  is allergic to oxaliplatin.  MEDICATIONS:  Current Outpatient Medications  Medication Sig Dispense Refill   acetaminophen (TYLENOL) 500 MG tablet Take 1,000 mg by mouth every 6 (six) hours as needed for mild pain.     dicyclomine (BENTYL) 10 MG capsule TAKE 1 CAPSULE (10 MG TOTAL) BY MOUTH 4 (FOUR) TIMES DAILY - BEFORE MEALS AND AT BEDTIME. 120 capsule 1   diphenoxylate-atropine (LOMOTIL) 2.5-0.025 MG tablet Take 2 tablets by mouth 4 (four) times daily as needed for diarrhea or loose stools. 60 tablet 1   hydrocortisone (ANUSOL-HC) 2.5 % rectal cream Place 1 application rectally 2 (two) times daily. 30 g 1   ondansetron (ZOFRAN) 8 MG tablet TAKE 1 TABLET BY MOUTH 2TIMES DAILY AS NEEDED FOR NAUSEA/VOMITING. START ON DAY3 AFTER CHEMOTHERAPY.     oxyCODONE (OXY IR/ROXICODONE) 5 MG immediate release tablet Take 1 tablet (5 mg total) by mouth every 6 (six) hours as needed for severe pain. 60 tablet 0   prochlorperazine (COMPAZINE) 10 MG tablet Take 1 tablet (10 mg total) by mouth every 6 (six) hours as needed for nausea or vomiting. 30 tablet 2   zolpidem (AMBIEN) 5 MG tablet Take 1 tablet (5 mg total) by mouth at bedtime as needed for sleep. 30 tablet 0   Current Facility-Administered Medications  Medication  Dose Route Frequency Provider Last Rate Last Admin   0.9 %  sodium chloride infusion   Intravenous PRN Causey, Charlestine Massed, NP       Facility-Administered Medications Ordered in Other Visits  Medication Dose Route Frequency Provider Last Rate Last Admin   0.9 %  sodium chloride infusion   Intravenous Continuous [name], MD       0.9 %  sodium chloride infusion   Intravenous Once [name], MD       atropine injection 0.5 mg  0.5 mg Intravenous Once PRN [name], MD       dextrose 5 % solution   Intravenous Once [name], MD       famotidine (PEPCID) IVPB 20 mg in NS 100 mL IVPB  20 mg Intravenous Once [name], MD        fluorouracil (ADRUCIL) 6,550 mg in sodium chloride 0.9 % 119 mL chemo infusion  2,800 mg/m2 (Treatment Plan Recorded) Intravenous 1 day or 1 dose [name], MD       fosaprepitant (EMEND) 150 mg in sodium chloride 0.9 % 145 mL IVPB  150 mg Intravenous Once [name], MD       heparin lock flush 100 unit/mL  500 Units Intracatheter Once PRN [name], MD       irinotecan (CAMPTOSAR) 420 mg in sodium chloride 0.9 % 500 mL chemo infusion  180 mg/m2 (Treatment Plan Recorded) Intravenous Once [name], MD       leucovorin 936 mg in sodium chloride 0.9 % 250 mL infusion  400 mg/m2 (Treatment Plan Recorded) Intravenous Once [name], MD       loratadine (CLARITIN) tablet 10 mg  10 mg Oral Once [name], MD       methylPREDNISolone sodium succinate (SOLU-MEDROL) 125 mg/2 mL injection 125 mg  125 mg Intravenous Once [name], MD       palonosetron (ALOXI) injection 0.25 mg  0.25 mg Intravenous Once [name], MD       sodium chloride flush (NS) 0.9 % injection 10 mL  10 mL Intracatheter PRN [name], MD        PHYSICAL EXAMINATION: ECOG PERFORMANCE STATUS: 1 - Symptomatic but completely ambulatory  Vitals:   06/19/21 0958  BP: 130/86  Pulse: 100  Resp: 18  Temp: 98.1 F (36.7 C)  SpO2: 97%   Filed Weights   06/19/21 0958  Weight: 261 lb 6.4 oz (118.6 kg)    GENERAL:alert, no distress and comfortable SKIN: No rash EYES: sclera clear NECK: Without mass LUNGS: clear with normal breathing effort HEART: regular rate & rhythm, no lower extremity edema ABDOMEN:abdomen soft, round, non-tender and normal bowel sounds.  Right upper abdominal wound still open < 1 cm, healing well NEURO: alert & oriented x 3 with fluent speech, no focal motor/sensory deficits in the fingertips per tuning fork exam PAC without erythema  LABORATORY DATA:  I have reviewed the data as listed CBC Latest Ref Rng & Units 06/19/2021 06/05/2021 05/22/2021  WBC 4.0 - 10.5 K/uL 12.8(H) 9.9 9.5  Hemoglobin 13.0 - 17.0  g/dL 14.6 14.7 13.9  Hematocrit 39.0 - 52.0 % 42.5 43.6 40.6  Platelets 150 - 400 K/uL 250 250 277     CMP Latest Ref Rng & Units 06/19/2021 06/05/2021 05/22/2021  Glucose 70 - 99 mg/dL 108(H) 116(H) 104(H)  BUN 6 - 20 mg/dL '14 18 19  ' Creatinine 0.61 - 1.24 mg/dL 1.28(H) 1.21 1.19  Sodium 135 - 145 mmol/L  141 139 138  Potassium 3.5 - 5.1 mmol/L 4.1 4.1 4.1  Chloride 98 - 111 mmol/L 107 106 103  CO2 22 - 32 mmol/L '24 23 24  ' Calcium 8.9 - 10.3 mg/dL 9.3 9.7 9.4  Total Protein 6.5 - 8.1 g/dL 7.8 7.9 7.6  Total Bilirubin 0.3 - 1.2 mg/dL 0.4 0.5 0.6  Alkaline Phos 38 - 126 U/L 140(H) 137(H) 102  AST 15 - 41 U/L '17 22 17  ' ALT 0 - 44 U/L '19 31 19      ' RADIOGRAPHIC STUDIES: I have personally reviewed the radiological images as listed and agreed with the findings in the report. No results found.   ASSESSMENT & PLAN: Ricardo [name] is a 55 y.o. male with no significant medical history.      1. 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 Work up showed mass in the terminal ileum with extensive omental nodularity and LN involvement. His baseline tumor marker CA 19-9 and CEA were normal. 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   This confirms stage IV metastatic cancer from cecum -He began first-line palliative chemo with FOLFOX on 12/18/2020, tolerated very well -FO shows KRAS/NRAS wildtype, but given his right side colon cancer, we do not use EGFR inhibitor and instead would recommend bevacizumab.  Bevacizumab was added with cycle 2 -CTAP without contrast from 02/24/2021  showed interval resolution of the previously seen small volume ascites, and interval improvement in the right lower quadrant mesocolon lymph nodes, consistent with treatment response of nodal and peritoneal metastatic disease. -Treatment was escalated to FOLFOXIRI with cycle 6 FOLFOX on 03/13/2021,  He tolerated well -He underwent laparoscopy on 05/13/2021 by Dr. Clovis Riley, unfortunately due to his extensive peritoneal metastasis he was not a candidate for cytoreductive surgery/HIPEC. -He continues to tolerate FOLFIRI and bevacizumab (on hold due to healing abdominal wound)   2. Symptom Management: Constipation, lower abdominal pain, abdominal bloating secondary #1 and partial bowl obstruction  -For 8-9 weeks before hospitalization he was having increasing lower abdominal pain and constipation. He did have G-tube placed with surgery on 11/29/20. He notes/drains with bag as needed. -Since starting chemo he has been able to come off most supportive meds including oxycodone, nausea meds, and Lomotil. -Dicyclomine helps cramping -IVF on days 1 and 3 with pump d/c have helped   3. Genetic testing -He has brother with prostate cancer and sister with breast cancer in their 19s. He is eligible for genetic testing.  -Negative result   4. Social Support -He is an avid cyclist. Last long distant riding was 06/2020 across [place]. He has not biked in the 8-9 weeks before cancer diagnosis due to symptoms. His weight heavily fluctuates with changes in his activity level, diet and biking. -He is married with 1 adult son. His brother Jenny Reichmann is in town. He works as a Designer, television/film set at [employer]. He may take time off work based on how to tolerate chemotherapy. -I offered cone resources including chaplin and SW if needed. He notes he has good family support, very close with his wife and 49 year old son who currently lives in [place]  5.  Goals of care discussion, full code -Due to his peritoneal metastasis, he is not  a candidate for cytoreductive surgery with HIPEC -He understands his cancer is metastatic, not curable, but still treatable -He tolerates chemo with good performance status -I asked him about his family which led to a brief goals of care discussion today 06/19/2021, still very much interested in continuing cancer treatment to live longer with good quality of life.   -In the end, he does not want to suffer.  He strongly desires to have a clear head to say goodbye to his wife and son when the time comes.   -Emotional support was provided.  We will continue goals of care discussions.  Disposition: Mr. Tones appears stable.  He completed another cycle of FOLFIRI, Avastin on hold for healing abdominal wound.  Likely will be able to resume at next cycle.  He tolerates treatment well with mild nausea, constipation, fatigue, and neuropathy.  Side effects are well managed with IV fluids on days 1 and 3 and supportive care at home.  He is able to recover and function well with good performance status.  There is no clinical evidence of disease progression.  Labs reviewed, adequate to proceed with FOLFIRI today as planned, same dose.  He will receive IV fluids today and with day 3 pump d/c.  Okay to hold G-CSF this cycle for leukocytosis.    We discussed nutrition/diet, and healthy weight loss, his goal is 225-230 pounds.  I recommend a normal healthy well-rounded diet, small portions/frequent p.o., hydration, and physical activity as tolerated.  He is still under strenuous exercise restriction due to healing abdominal wound.  He declined follow-up with nutritionist today.  Last imaging in our system was 02/24/2021, patient states he had a CT prior to laparoscopy in August, I will request the report.  If it was not done, we will plan to scan him before next cycle.  Follow-up in 2 weeks with next cycle.  All questions were answered. The patient knows to call the clinic with any problems, questions or concerns. No  barriers to learning were detected.  Total encounter time was 30 minutes.     Alla Feeling, NP 06/19/21

## 2021-06-19 ENCOUNTER — Inpatient Hospital Stay: Payer: BC Managed Care – PPO

## 2021-06-19 ENCOUNTER — Encounter: Payer: Self-pay | Admitting: Nurse Practitioner

## 2021-06-19 ENCOUNTER — Inpatient Hospital Stay (HOSPITAL_BASED_OUTPATIENT_CLINIC_OR_DEPARTMENT_OTHER): Payer: BC Managed Care – PPO | Admitting: Nurse Practitioner

## 2021-06-19 ENCOUNTER — Other Ambulatory Visit: Payer: Self-pay

## 2021-06-19 ENCOUNTER — Inpatient Hospital Stay: Payer: BC Managed Care – PPO | Attending: Nurse Practitioner

## 2021-06-19 VITALS — BP 130/86 | HR 100 | Temp 98.1°F | Resp 18 | Ht 71.0 in | Wt 261.4 lb

## 2021-06-19 DIAGNOSIS — C772 Secondary and unspecified malignant neoplasm of intra-abdominal lymph nodes: Secondary | ICD-10-CM

## 2021-06-19 DIAGNOSIS — Z5111 Encounter for antineoplastic chemotherapy: Secondary | ICD-10-CM | POA: Diagnosis present

## 2021-06-19 DIAGNOSIS — Z79899 Other long term (current) drug therapy: Secondary | ICD-10-CM | POA: Diagnosis not present

## 2021-06-19 DIAGNOSIS — Z931 Gastrostomy status: Secondary | ICD-10-CM | POA: Diagnosis not present

## 2021-06-19 DIAGNOSIS — C786 Secondary malignant neoplasm of retroperitoneum and peritoneum: Secondary | ICD-10-CM | POA: Insufficient documentation

## 2021-06-19 DIAGNOSIS — C182 Malignant neoplasm of ascending colon: Secondary | ICD-10-CM | POA: Diagnosis present

## 2021-06-19 DIAGNOSIS — Z95828 Presence of other vascular implants and grafts: Secondary | ICD-10-CM

## 2021-06-19 LAB — CMP (CANCER CENTER ONLY)
ALT: 19 U/L (ref 0–44)
AST: 17 U/L (ref 15–41)
Albumin: 3.9 g/dL (ref 3.5–5.0)
Alkaline Phosphatase: 140 U/L — ABNORMAL HIGH (ref 38–126)
Anion gap: 10 (ref 5–15)
BUN: 14 mg/dL (ref 6–20)
CO2: 24 mmol/L (ref 22–32)
Calcium: 9.3 mg/dL (ref 8.9–10.3)
Chloride: 107 mmol/L (ref 98–111)
Creatinine: 1.28 mg/dL — ABNORMAL HIGH (ref 0.61–1.24)
GFR, Estimated: >60 mL/min (ref >60)
Glucose, Bld: 108 mg/dL — ABNORMAL HIGH (ref 70–99)
Potassium: 4.1 mmol/L (ref 3.5–5.1)
Sodium: 141 mmol/L (ref 135–145)
Total Bilirubin: 0.4 mg/dL (ref 0.3–1.2)
Total Protein: 7.8 g/dL (ref 6.5–8.1)

## 2021-06-19 LAB — CBC WITH DIFFERENTIAL (CANCER CENTER ONLY)
Abs Immature Granulocytes: 0.08 10*3/uL — ABNORMAL HIGH (ref 0.00–0.07)
Basophils Absolute: 0 10*3/uL (ref 0.0–0.1)
Basophils Relative: 0 %
Eosinophils Absolute: 0.1 10*3/uL (ref 0.0–0.5)
Eosinophils Relative: 1 %
HCT: 42.5 % (ref 39.0–52.0)
Hemoglobin: 14.6 g/dL (ref 13.0–17.0)
Immature Granulocytes: 1 %
Lymphocytes Relative: 10 %
Lymphs Abs: 1.2 10*3/uL (ref 0.7–4.0)
MCH: 31.6 pg (ref 26.0–34.0)
MCHC: 34.4 g/dL (ref 30.0–36.0)
MCV: 92 fL (ref 80.0–100.0)
Monocytes Absolute: 0.8 10*3/uL (ref 0.1–1.0)
Monocytes Relative: 7 %
Neutro Abs: 10.6 10*3/uL — ABNORMAL HIGH (ref 1.7–7.7)
Neutrophils Relative %: 81 %
Platelet Count: 250 10*3/uL (ref 150–400)
RBC: 4.62 MIL/uL (ref 4.22–5.81)
RDW: 15.5 % (ref 11.5–15.5)
WBC Count: 12.8 10*3/uL — ABNORMAL HIGH (ref 4.0–10.5)
nRBC: 0 % (ref 0.0–0.2)

## 2021-06-19 MED ORDER — SODIUM CHLORIDE 0.9 % IV SOLN
400.0000 mg/m2 | Freq: Once | INTRAVENOUS | Status: AC
  Administered 2021-06-19: 936 mg via INTRAVENOUS
  Filled 2021-06-19: qty 46.8

## 2021-06-19 MED ORDER — PALONOSETRON HCL INJECTION 0.25 MG/5ML
INTRAVENOUS | Status: AC
  Filled 2021-06-19: qty 5

## 2021-06-19 MED ORDER — ATROPINE SULFATE 1 MG/ML IJ SOLN
0.5000 mg | Freq: Once | INTRAMUSCULAR | Status: AC | PRN
  Administered 2021-06-19: 0.5 mg via INTRAVENOUS

## 2021-06-19 MED ORDER — DEXTROSE 5 % IV SOLN: Freq: Once | INTRAVENOUS | Status: DC

## 2021-06-19 MED ORDER — METHYLPREDNISOLONE SODIUM SUCC 125 MG IJ SOLR
INTRAMUSCULAR | Status: AC
  Filled 2021-06-19: qty 2

## 2021-06-19 MED ORDER — SODIUM CHLORIDE 0.9 % IV SOLN: INTRAVENOUS | Status: DC

## 2021-06-19 MED ORDER — FAMOTIDINE 20 MG IN NS 100 ML IVPB
INTRAVENOUS | Status: AC
  Filled 2021-06-19: qty 100

## 2021-06-19 MED ORDER — SODIUM CHLORIDE 0.9% FLUSH
10.0000 mL | Freq: Once | INTRAVENOUS | Status: AC
  Administered 2021-06-19: 10 mL

## 2021-06-19 MED ORDER — SODIUM CHLORIDE 0.9 % IV SOLN
150.0000 mg | Freq: Once | INTRAVENOUS | Status: AC
  Administered 2021-06-19: 150 mg via INTRAVENOUS
  Filled 2021-06-19: qty 150

## 2021-06-19 MED ORDER — LORATADINE 10 MG PO TABS
10.0000 mg | ORAL_TABLET | Freq: Once | ORAL | Status: AC
  Administered 2021-06-19: 10 mg via ORAL

## 2021-06-19 MED ORDER — PALONOSETRON HCL INJECTION 0.25 MG/5ML
0.2500 mg | Freq: Once | INTRAVENOUS | Status: AC
  Administered 2021-06-19: 0.25 mg via INTRAVENOUS

## 2021-06-19 MED ORDER — LORATADINE 10 MG PO TABS
ORAL_TABLET | ORAL | Status: AC
  Filled 2021-06-19: qty 1

## 2021-06-19 MED ORDER — ATROPINE SULFATE 1 MG/ML IJ SOLN
INTRAMUSCULAR | Status: AC
  Filled 2021-06-19: qty 1

## 2021-06-19 MED ORDER — FAMOTIDINE 20 MG IN NS 100 ML IVPB
20.0000 mg | Freq: Once | INTRAVENOUS | Status: AC
  Administered 2021-06-19: 20 mg via INTRAVENOUS

## 2021-06-19 MED ORDER — SODIUM CHLORIDE 0.9 % IV SOLN: Freq: Once | INTRAVENOUS | Status: AC

## 2021-06-19 MED ORDER — SODIUM CHLORIDE 0.9% FLUSH: 10.0000 mL | INTRAVENOUS | Status: DC | PRN

## 2021-06-19 MED ORDER — HEPARIN SOD (PORK) LOCK FLUSH 100 UNIT/ML IV SOLN
500.0000 [IU] | Freq: Once | INTRAVENOUS | Status: DC | PRN
Start: 2021-06-19 — End: 2021-06-19

## 2021-06-19 MED ORDER — SODIUM CHLORIDE 0.9 % IV SOLN
180.0000 mg/m2 | Freq: Once | INTRAVENOUS | Status: AC
  Administered 2021-06-19: 420 mg via INTRAVENOUS
  Filled 2021-06-19: qty 15

## 2021-06-19 MED ORDER — SODIUM CHLORIDE 0.9 % IV SOLN
2800.0000 mg/m2 | INTRAVENOUS | Status: DC
  Administered 2021-06-19: 6550 mg via INTRAVENOUS
  Filled 2021-06-19: qty 131

## 2021-06-19 MED ORDER — METHYLPREDNISOLONE SODIUM SUCC 125 MG IJ SOLR
125.0000 mg | Freq: Once | INTRAMUSCULAR | Status: AC
  Administered 2021-06-19: 125 mg via INTRAVENOUS

## 2021-06-19 NOTE — Progress Notes (Signed)
Per Dr. Burr Medico, ok to run 5FU pump over 46 hours instead of ordered rate of 48 hours. Rate changed to 5.70ml/hr.

## 2021-06-19 NOTE — Patient Instructions (Addendum)
[facility]  Discharge Instructions: Thank you for choosing [facility] to provide your oncology and hematology care.   If you have a lab appointment with the [facility], please go directly to the [facility] and check in at the registration area.   Wear comfortable clothing and clothing appropriate for easy access to any Portacath or PICC line.   We strive to give you quality time with your provider. You may need to reschedule your appointment if you arrive late (15 or more minutes).  Arriving late affects you and other patients whose appointments are after yours.  Also, if you miss three or more appointments without notifying the office, you may be dismissed from the clinic at the provider's discretion.      For prescription refill requests, have your pharmacy contact our office and allow 72 hours for refills to be completed.    Today you received the following chemotherapy and/or immunotherapy agents: Irinotecan, Leucovorin, and Fluorouracil.    To help prevent nausea and vomiting after your treatment, we encourage you to take your nausea medication as directed.  BELOW ARE SYMPTOMS THAT SHOULD BE REPORTED IMMEDIATELY: *FEVER GREATER THAN 100.4 F (38 C) OR HIGHER *CHILLS OR SWEATING *NAUSEA AND VOMITING THAT IS NOT CONTROLLED WITH YOUR NAUSEA MEDICATION *UNUSUAL SHORTNESS OF BREATH *UNUSUAL BRUISING OR BLEEDING *URINARY PROBLEMS (pain or burning when urinating, or frequent urination) *BOWEL PROBLEMS (unusual diarrhea, constipation, pain near the anus) TENDERNESS IN MOUTH AND THROAT WITH OR WITHOUT PRESENCE OF ULCERS (sore throat, sores in mouth, or a toothache) UNUSUAL RASH, SWELLING OR PAIN  UNUSUAL VAGINAL DISCHARGE OR ITCHING   Items with * indicate a potential emergency and should be followed up as soon as possible or go to the Emergency Department if any problems should occur.  Please show the CHEMOTHERAPY ALERT CARD or  IMMUNOTHERAPY ALERT CARD at check-in to the Emergency Department and triage nurse.  Should you have questions after your visit or need to cancel or reschedule your appointment, please contact [facility]  Dept: [phone]  and follow the prompts.  Office hours are 8:00 a.m. to 4:30 p.m. Monday - Friday. Please note that voicemails left after 4:00 p.m. may not be returned until the following business day.  We are closed weekends and major holidays. You have access to a nurse at all times for urgent questions. Please call the main number to the clinic Dept: [phone] and follow the prompts.   For any non-urgent questions, you may also contact your provider using MyChart. We now offer e-Visits for anyone 57 and older to request care online for non-urgent symptoms. For details visit [email or web address].   Also download the MyChart app! Go to the app store, search "MyChart", open the app, select [facility], and log in with your MyChart username and password.  Due to Covid, a mask is required upon entering the hospital/clinic. If you do not have a mask, one will be given to you upon arrival. For doctor visits, patients may have 1 support person aged 29 or older with them. For treatment visits, patients cannot have anyone with them due to current Covid guidelines and our immunocompromised population.    Rehydration, Adult Rehydration is the replacement of body fluids, salts, and minerals (electrolytes) that are lost during dehydration. Dehydration is when there is not enough water or other fluids in the body. This happens when you lose more fluids than you take in. Common causes of dehydration  include: Not drinking enough fluids. This can occur when you are ill or doing activities that require a lot of energy, especially in hot weather. Conditions that cause loss of water or other fluids, such as diarrhea, vomiting, sweating, or urinating a lot. Other illnesses, such  as fever or infection. Certain medicines, such as those that remove excess fluid from the body (diuretics). Symptoms of mild or moderate dehydration may include thirst, dry lips and mouth, and dizziness. Symptoms of severe dehydration may include increased heart rate, confusion, fainting, and not urinating. For severe dehydration, you may need to get fluids through an IV at the hospital. For mild or moderate dehydration, you can usually rehydrate at home by drinking certain fluids as told by your health care provider. What are the risks? Generally, rehydration is safe. However, taking in too much fluid (overhydration) can be a problem. This is rare. Overhydration can cause an electrolyte imbalance, kidney failure, or a decrease in salt (sodium) levels in the body. Supplies needed You will need an oral rehydration solution (ORS) if your health care provider tells you to use one. This is a drink to treat dehydration. It can be found in pharmacies and retail stores. How to rehydrate Fluids Follow instructions from your health care provider for rehydration. The kind of fluid and the amount you should drink depend on your condition. In general, you should choose drinks that you prefer. If told by your health care provider, drink an ORS. Make an ORS by following instructions on the package. Start by drinking small amounts, about  cup (120 mL) every 5-10 minutes. Slowly increase how much you drink until you have taken the amount recommended by your health care provider. Drink enough clear fluids to keep your urine pale yellow. If you were told to drink an ORS, finish it first, then start slowly drinking other clear fluids. Drink fluids such as: Water. This includes sparkling water and flavored water. Drinking only water can lead to having too little sodium in your body (hyponatremia). Follow the advice of your health care provider. Water from ice chips you suck on. Fruit juice with water you add to it  (diluted). Sports drinks. Hot or cold herbal teas. Broth-based soups. Milk or milk products. Food Follow instructions from your health care provider about what to eat while you rehydrate. Your health care provider may recommend that you slowly begin eating regular foods in small amounts. Eat foods that contain a healthy balance of electrolytes, such as bananas, oranges, potatoes, tomatoes, and spinach. Avoid foods that are greasy or contain a lot of sugar. In some cases, you may get nutrition through a feeding tube that is passed through your nose and into your stomach (nasogastric tube, or NG tube). This may be done if you have uncontrolled vomiting or diarrhea. Beverages to avoid Certain beverages may make dehydration worse. While you rehydrate, avoid drinking alcohol. How to tell if you are recovering from dehydration You may be recovering from dehydration if: You are urinating more often than before you started rehydrating. Your urine is pale yellow. Your energy level improves. You vomit less frequently. You have diarrhea less frequently. Your appetite improves or returns to normal. You feel less dizzy or less light-headed. Your skin tone and color start to look more normal. Follow these instructions at home: Take over-the-counter and prescription medicines only as told by your health care provider. Do not take sodium tablets. Doing this can lead to having too much sodium in your body (hypernatremia). Contact  a health care provider if: You continue to have symptoms of mild or moderate dehydration, such as: Thirst. Dry lips. Slightly dry mouth. Dizziness. Dark urine or less urine than normal. Muscle cramps. You continue to vomit or have diarrhea. Get help right away if you: Have symptoms of dehydration that get worse. Have a fever. Have a severe headache. Have been vomiting and the following happens: Your vomiting gets worse or does not go away. Your vomit includes blood or  green matter (bile). You cannot eat or drink without vomiting. Have problems with urination or bowel movements, such as: Diarrhea that gets worse or does not go away. Blood in your stool (feces). This may cause stool to look black and tarry. Not urinating, or urinating only a small amount of very dark urine, within 6-8 hours. Have trouble breathing. Have symptoms that get worse with treatment. These symptoms may represent a serious problem that is an emergency. Do not wait to see if the symptoms will go away. Get medical help right away. Call your local emergency services (911 in the [place] Do not drive yourself to the hospital. Summary Rehydration is the replacement of body fluids and minerals (electrolytes) that are lost during dehydration. Follow instructions from your health care provider for rehydration. The kind of fluid and amount you should drink depend on your condition. Slowly increase how much you drink until you have taken the amount recommended by your health care provider. Contact your health care provider if you continue to show signs of mild or moderate dehydration. This information is not intended to replace advice given to you by your health care provider. Make sure you discuss any questions you have with your health care provider. Document Revised: 11/01/2019 Document Reviewed: 09/11/2019 Elsevier Patient Education  [employer].  The chemotherapy medication bag should finish at 46 hours, 96 hours, or 7 days. For example, if your pump is scheduled for 46 hours and it was put on at 4:00 p.m., it should finish at 2:00 p.m. the day it is scheduled to come off regardless of your appointment time.     Estimated time to finish at 12:07 PM   If the display on your pump reads "Low Volume" and it is beeping, take the batteries out of the pump and come to the cancer center for it to be taken off.   If the pump alarms go off prior to the pump reading "Low Volume" then call  [phone] and someone can assist you.  If the plunger comes out and the chemotherapy medication is leaking out, please use your home chemo spill kit to clean up the spill. Do NOT use paper towels or other household products.  If you have problems or questions regarding your pump, please call either [phone] (24 hours a day) or the cancer center Monday-Friday 8:00 a.m.- 4:30 p.m. at the clinic number and we will assist you. If you are unable to get assistance, then go to the nearest Emergency Department and ask the staff to contact the IV team for assistance.

## 2021-06-20 ENCOUNTER — Encounter: Payer: Self-pay | Admitting: Hematology

## 2021-06-20 ENCOUNTER — Telehealth: Payer: Self-pay | Admitting: Hematology

## 2021-06-20 NOTE — Telephone Encounter (Signed)
Left message with follow-up appointments per 10/6 los.

## 2021-06-21 ENCOUNTER — Inpatient Hospital Stay: Payer: BC Managed Care – PPO

## 2021-06-21 ENCOUNTER — Other Ambulatory Visit: Payer: Self-pay

## 2021-06-21 VITALS — BP 140/86 | HR 18 | Temp 98.0°F | Resp 20

## 2021-06-21 DIAGNOSIS — C772 Secondary and unspecified malignant neoplasm of intra-abdominal lymph nodes: Secondary | ICD-10-CM

## 2021-06-21 DIAGNOSIS — C182 Malignant neoplasm of ascending colon: Secondary | ICD-10-CM | POA: Diagnosis not present

## 2021-06-21 MED ORDER — SODIUM CHLORIDE 0.9% FLUSH
10.0000 mL | INTRAVENOUS | Status: DC | PRN
  Administered 2021-06-21: 10 mL

## 2021-06-21 MED ORDER — SODIUM CHLORIDE 0.9 % IV SOLN: INTRAVENOUS | Status: DC

## 2021-06-21 MED ORDER — HEPARIN SOD (PORK) LOCK FLUSH 100 UNIT/ML IV SOLN
500.0000 [IU] | Freq: Once | INTRAVENOUS | Status: AC | PRN
Start: 2021-06-21 — End: 2021-06-21
  Administered 2021-06-21: 500 [IU]

## 2021-06-21 NOTE — Patient Instructions (Signed)

## 2021-06-23 ENCOUNTER — Other Ambulatory Visit: Payer: Self-pay | Admitting: Nurse Practitioner

## 2021-06-23 ENCOUNTER — Telehealth: Payer: Self-pay | Admitting: *Deleted

## 2021-06-23 DIAGNOSIS — C182 Malignant neoplasm of ascending colon: Secondary | ICD-10-CM

## 2021-06-23 DIAGNOSIS — C772 Secondary and unspecified malignant neoplasm of intra-abdominal lymph nodes: Secondary | ICD-10-CM

## 2021-06-23 NOTE — Telephone Encounter (Signed)
Called to make pt aware of upcoming CT scan for 10/18 at 8:30 am. Gave instructions on being NPO 4 hrs prior and drinking of contrast. Pt verbalized understanding.

## 2021-07-01 ENCOUNTER — Other Ambulatory Visit: Payer: Self-pay

## 2021-07-01 ENCOUNTER — Ambulatory Visit (HOSPITAL_COMMUNITY)
Admission: RE | Admit: 2021-07-01 | Discharge: 2021-07-01 | Disposition: A | Discharge status: Home / Self Care | Payer: BC Managed Care – PPO | Source: Ambulatory Visit | Attending: Nurse Practitioner | Admitting: Nurse Practitioner

## 2021-07-01 DIAGNOSIS — C182 Malignant neoplasm of ascending colon: Secondary | ICD-10-CM

## 2021-07-01 DIAGNOSIS — C772 Secondary and unspecified malignant neoplasm of intra-abdominal lymph nodes: Secondary | ICD-10-CM | POA: Insufficient documentation

## 2021-07-01 MED ORDER — HEPARIN SOD (PORK) LOCK FLUSH 100 UNIT/ML IV SOLN
500.0000 [IU] | Freq: Once | INTRAVENOUS | Status: AC
  Administered 2021-07-01: 500 [IU] via INTRAVENOUS

## 2021-07-01 MED ORDER — HEPARIN SOD (PORK) LOCK FLUSH 100 UNIT/ML IV SOLN
INTRAVENOUS | Status: AC
  Filled 2021-07-01: qty 5

## 2021-07-01 MED ORDER — IOHEXOL 350 MG/ML SOLN
100.0000 mL | Freq: Once | INTRAVENOUS | Status: AC | PRN
  Administered 2021-07-01: 100 mL via INTRAVENOUS

## 2021-07-02 MED FILL — Fosaprepitant Dimeglumine For IV Infusion 150 MG (Base Eq): INTRAVENOUS | Qty: 5 | Status: AC

## 2021-07-03 ENCOUNTER — Other Ambulatory Visit: Payer: Self-pay | Admitting: *Deleted

## 2021-07-03 ENCOUNTER — Inpatient Hospital Stay (HOSPITAL_BASED_OUTPATIENT_CLINIC_OR_DEPARTMENT_OTHER): Payer: BC Managed Care – PPO | Admitting: Hematology

## 2021-07-03 ENCOUNTER — Inpatient Hospital Stay: Payer: BC Managed Care – PPO

## 2021-07-03 ENCOUNTER — Other Ambulatory Visit: Payer: Self-pay

## 2021-07-03 ENCOUNTER — Encounter: Payer: Self-pay | Admitting: Hematology

## 2021-07-03 VITALS — BP 123/87 | HR 96 | Temp 98.6°F | Resp 18 | Ht 71.0 in | Wt 260.1 lb

## 2021-07-03 DIAGNOSIS — C182 Malignant neoplasm of ascending colon: Secondary | ICD-10-CM

## 2021-07-03 DIAGNOSIS — C772 Secondary and unspecified malignant neoplasm of intra-abdominal lymph nodes: Secondary | ICD-10-CM

## 2021-07-03 DIAGNOSIS — Z95828 Presence of other vascular implants and grafts: Secondary | ICD-10-CM

## 2021-07-03 DIAGNOSIS — C786 Secondary malignant neoplasm of retroperitoneum and peritoneum: Secondary | ICD-10-CM

## 2021-07-03 LAB — CBC WITH DIFFERENTIAL (CANCER CENTER ONLY)
Abs Immature Granulocytes: 0.05 10*3/uL (ref 0.00–0.07)
Basophils Absolute: 0 10*3/uL (ref 0.0–0.1)
Basophils Relative: 0 %
Eosinophils Absolute: 0.2 10*3/uL (ref 0.0–0.5)
Eosinophils Relative: 2 %
HCT: 40.3 % (ref 39.0–52.0)
Hemoglobin: 14 g/dL (ref 13.0–17.0)
Immature Granulocytes: 1 %
Lymphocytes Relative: 15 %
Lymphs Abs: 1.1 10*3/uL (ref 0.7–4.0)
MCH: 31.1 pg (ref 26.0–34.0)
MCHC: 34.7 g/dL (ref 30.0–36.0)
MCV: 89.6 fL (ref 80.0–100.0)
Monocytes Absolute: 0.8 10*3/uL (ref 0.1–1.0)
Monocytes Relative: 10 %
Neutro Abs: 5.5 10*3/uL (ref 1.7–7.7)
Neutrophils Relative %: 72 %
Platelet Count: 269 10*3/uL (ref 150–400)
RBC: 4.5 MIL/uL (ref 4.22–5.81)
RDW: 14.7 % (ref 11.5–15.5)
WBC Count: 7.7 10*3/uL (ref 4.0–10.5)
nRBC: 0 % (ref 0.0–0.2)

## 2021-07-03 LAB — TOTAL PROTEIN, URINE DIPSTICK: Protein, ur: NEGATIVE mg/dL

## 2021-07-03 LAB — CMP (CANCER CENTER ONLY)
ALT: 34 U/L (ref 0–44)
AST: 20 U/L (ref 15–41)
Albumin: 3.9 g/dL (ref 3.5–5.0)
Alkaline Phosphatase: 114 U/L (ref 38–126)
Anion gap: 11 (ref 5–15)
BUN: 17 mg/dL (ref 6–20)
CO2: 22 mmol/L (ref 22–32)
Calcium: 9.5 mg/dL (ref 8.9–10.3)
Chloride: 106 mmol/L (ref 98–111)
Creatinine: 1.18 mg/dL (ref 0.61–1.24)
GFR, Estimated: >60 mL/min (ref >60)
Glucose, Bld: 84 mg/dL (ref 70–99)
Potassium: 4.5 mmol/L (ref 3.5–5.1)
Sodium: 139 mmol/L (ref 135–145)
Total Bilirubin: 0.4 mg/dL (ref 0.3–1.2)
Total Protein: 7.6 g/dL (ref 6.5–8.1)

## 2021-07-03 MED ORDER — FAMOTIDINE 20 MG IN NS 100 ML IVPB: 20.0000 mg | Freq: Once | INTRAVENOUS | Status: DC

## 2021-07-03 MED ORDER — HEPARIN SOD (PORK) LOCK FLUSH 100 UNIT/ML IV SOLN: 500.0000 [IU] | Freq: Once | INTRAVENOUS | Status: DC | PRN

## 2021-07-03 MED ORDER — SODIUM CHLORIDE 0.9 % IV SOLN
2800.0000 mg/m2 | INTRAVENOUS | Status: DC
  Administered 2021-07-03: 6550 mg via INTRAVENOUS
  Filled 2021-07-03: qty 131

## 2021-07-03 MED ORDER — SODIUM CHLORIDE 0.9 % IV SOLN: Freq: Once | INTRAVENOUS | Status: AC

## 2021-07-03 MED ORDER — BEVACIZUMAB-BVZR CHEMO INJECTION 400 MG/16ML
5.0000 mg/kg | Freq: Once | INTRAVENOUS | Status: AC
  Administered 2021-07-03: 600 mg via INTRAVENOUS
  Filled 2021-07-03: qty 16

## 2021-07-03 MED ORDER — SODIUM CHLORIDE 0.9% FLUSH
10.0000 mL | Freq: Once | INTRAVENOUS | Status: AC
  Administered 2021-07-03: 10 mL

## 2021-07-03 MED ORDER — SODIUM CHLORIDE 0.9 % IV SOLN
400.0000 mg/m2 | Freq: Once | INTRAVENOUS | Status: AC
  Administered 2021-07-03: 936 mg via INTRAVENOUS
  Filled 2021-07-03: qty 46.8

## 2021-07-03 MED ORDER — SODIUM CHLORIDE 0.9% FLUSH: 10.0000 mL | INTRAVENOUS | Status: DC | PRN

## 2021-07-03 MED ORDER — SODIUM CHLORIDE 0.9 % IV SOLN
150.0000 mg | Freq: Once | INTRAVENOUS | Status: AC
Start: 2021-07-03 — End: 2021-07-03
  Administered 2021-07-03: 150 mg via INTRAVENOUS
  Filled 2021-07-03: qty 150

## 2021-07-03 MED ORDER — DEXTROSE 5 % IV SOLN: Freq: Once | INTRAVENOUS | Status: DC

## 2021-07-03 MED ORDER — SODIUM CHLORIDE 0.9 % IV SOLN: INTRAVENOUS | Status: DC

## 2021-07-03 MED ORDER — METHYLPREDNISOLONE SODIUM SUCC 125 MG IJ SOLR: 125.0000 mg | Freq: Once | INTRAMUSCULAR | Status: DC

## 2021-07-03 MED ORDER — LORATADINE 10 MG PO TABS: 10.0000 mg | ORAL_TABLET | Freq: Once | ORAL | Status: DC

## 2021-07-03 MED ORDER — SODIUM CHLORIDE 0.9 % IV SOLN
10.0000 mg | Freq: Once | INTRAVENOUS | Status: AC
  Administered 2021-07-03: 10 mg via INTRAVENOUS
  Filled 2021-07-03: qty 10

## 2021-07-03 MED ORDER — PALONOSETRON HCL INJECTION 0.25 MG/5ML
0.2500 mg | Freq: Once | INTRAVENOUS | Status: AC
  Administered 2021-07-03: 0.25 mg via INTRAVENOUS
  Filled 2021-07-03: qty 5

## 2021-07-03 MED ORDER — IRINOTECAN HCL CHEMO INJECTION 100 MG/5ML
180.0000 mg/m2 | Freq: Once | INTRAVENOUS | Status: AC
  Administered 2021-07-03: 420 mg via INTRAVENOUS
  Filled 2021-07-03: qty 15

## 2021-07-03 MED ORDER — ATROPINE SULFATE 1 MG/ML IV SOLN
0.5000 mg | Freq: Once | INTRAVENOUS | Status: AC | PRN
  Administered 2021-07-03: 0.5 mg via INTRAVENOUS
  Filled 2021-07-03: qty 1

## 2021-07-03 NOTE — Progress Notes (Signed)
[place]   Telephone:([phone] Fax:(336) [phone]   Clinic Follow up Note   Patient Care Team: [name], MD as PCP - General (Internal Medicine) [name], MD as Consulting Physician (Oncology) [name], RN (Inactive) as Oncology Nurse Navigator  Date of Service:  07/03/2021  CHIEF COMPLAINT: f/u of metastatic colon cancer  CURRENT THERAPY:  First line FOLFOX q2 weeks starting 12/18/20. Bevacizumab added with cycle 2. Added Irinotecan from C6 on 03/13/21. Oxaliplatin discontinued after C7  ASSESSMENT & PLAN:  Ricardo [name] is a 55 y.o. male with   1. 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 Work up showed mass in the terminal ileum with extensive omental nodularity and LN involvement. His baseline tumor marker CA 19-9 and CEA were normal. 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   This confirms stage IV metastatic cancer from cecum -He began first-line palliative chemo with FOLFOX on 12/18/2020, tolerated very well -FO shows KRAS/NRAS wildtype, but given his right side colon cancer, we do not use EGFR inhibitor and instead would recommend bevacizumab.  Bevacizumab was added with cycle 2 -CTAP w/o contrast from 02/24/2021 showed treatment response of nodal and peritoneal metastatic disease. -Treatment was escalated to [facility] with cycle 6 FOLFOX on 03/13/2021,  He tolerated well. Oxaliplatin discontinued with C7. -He underwent laparoscopy on 05/13/2021 by Dr. Clovis Riley, unfortunately due to his extensive peritoneal  metastasis he was not a candidate for cytoreductive surgery/HIPEC. -restaging CT CAP 07/01/21 showed overall stable disease. I personally reviewed and discussed with pt  -He continues to tolerate FOLFIRI and bevacizumab with IVF and udenyca.   2. Symptom Management: Constipation, lower abdominal pain, abdominal bloating secondary #1 and partial bowl obstruction  -For 8-9 weeks before hospitalization he was having increasing lower abdominal pain and constipation.  -Since starting chemo he has been able to come off most supportive meds including oxycodone, nausea meds, and Lomotil. -Dicyclomine helps cramping -IVF on days 1 and 3 with pump d/c have helped   3. Genetic testing -He has brother with prostate cancer and sister with breast cancer in their 61s. He is eligible for genetic testing.  -Negative result   4. Social Support -He is an avid cyclist. Last long distant riding was 06/2020 across [place]. He has not biked in the 8-9 weeks before cancer diagnosis due to symptoms. His weight heavily fluctuates with changes in his activity level, diet and biking. -He is married with 1 adult son. He notes he has good family support, very close with his wife and 14 year old son who currently works in a school for troubled teens in [place]. His brother Jenny Reichmann is in town. He works as a Designer, television/film set at [employer].    5.  Goals of care discussion, full code -Due to his peritoneal metastasis, he is not a candidate for cytoreductive surgery with HIPEC -He understands his cancer is metastatic, not curable, but still treatable -He tolerates chemo with good performance status and is still very much interested in continuing cancer treatment to live longer with good quality of life.     PLAN: -proceed with C8 FOLFIRI  with Beva at same dose  -add IVF to days 1 and 3 with each cycle -Lab, flush, f/u, and chemo FOLFIRI in 2 weeks    No problem-specific Assessment & Plan notes found for this  encounter.   SUMMARY OF ONCOLOGIC HISTORY: Oncology History Overview Note  Cancer Staging metastatic cecal cancer Staging form: Colon and Rectum, AJCC 8th Edition - Clinical stage from 11/29/2020: Stage IVC (cTX, cN2, pM1c) - Signed by [name], MD on 12/04/2020 Stage prefix: Initial diagnosis Histologic grade (G): G3 Histologic grading system: 4 grade system    metastatic cecal cancer  11/27/2020 Imaging   CT Angio CAP  IMPRESSION: 1. Thickening of the terminal ileum with associated proximal mid to distal small bowel obstruction. Findings could be due to an ileitis versus malignancy. Nonspecific mesenteric edema could be due to engorgement versus metastases. No associated bowel perforation. Recommend endoscopy for further evaluation. 2. Indeterminate right lower quadrant lymphadenopathy. 3. Scattered colonic diverticulosis with no acute diverticulitis. 4. Stable right hepatic lobe subcentimeter hyperdensity likely represents a hepatic hemangioma. 5. No acute vascular abnormality. Aortic Atherosclerosis (ICD10-I70.0) - mild. 6. No acute intrathoracic abnormality.   11/28/2020 Imaging   CT AP  IMPRESSION: 1. There is masslike, circumferential thickening of the terminal ileum and cecal base near the ileocecal valve and abnormally enlarged lymph nodes in the right lower quadrant mesentery adjacent to the terminal ileum measuring up to 2.3 x 1.4 cm. 2. There is extensive omental and peritoneal nodularity and caking throughout the abdomen. 3. Findings are highly concerning for primary colon malignancy with nodal and peritoneal metastatic disease. 4. Small volume perihepatic and perisplenic ascites. 5. The small bowel is generally decompressed, with full some fluid-filled, nondistended loops throughout. There is transit of oral enteric contrast to the terminal ileum. No evidence of overt bowel obstruction at this time. Esophagogastric tube is position with tip and side port  below the diaphragm. 6. Atelectasis or consolidation of the dependent bilateral lung bases, new compared to prior examination.   Aortic Atherosclerosis (ICD10-I70.0).     11/29/2020 Surgery   EXPLORATORY LAPAROTOMY, PARTIAL BOWEL RESECTION, POSSIBLE OSTOMY CREATION, PERITIONEAL BIOPSY, INSERTION OF GASTROSTOMY TUBE by Dr [name]   11/29/2020 Initial Biopsy   FINAL MICROSCOPIC DIAGNOSIS:   AB. 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    COMMENT:   Immunohistochemistry (IHC) for CK20 and CDX-2 is strong and diffusely  positive.  CK7, TTF-1, Synaptophysin, Chromogranin and CD56 are  negative.  The immunophenotype is compatible with origin from the lower  gastrointestinal tract.  IHC for MMR will be reported separately.  Case  preliminarily discussed with Dr. [name] on 12/02/2020.   At the request of Dr. [name], [phone] was reviewed in retrospect.  Review of the submitted sections confirms the presence of acute  appendicitis.  No malignancy is identified.    11/29/2020 Cancer Staging   Staging form: Colon and Rectum, AJCC 8th Edition - Clinical stage from 11/29/2020: Stage IVC (cTX, cN2, pM1c) - Signed by [name], MD on 12/04/2020 Stage prefix: Initial diagnosis Histologic grade (G): G3 Histologic grading system: 4 grade system   12/04/2020 Initial Diagnosis   Cancer of ascending colon metastatic to intra-abdominal lymph node ([place])    Chemotherapy   FOLFOX q2weeks    12/18/2020 - 02/15/2021 Chemotherapy      Patient is on Antibody Plan: COLORECTAL BEVACIZUMAB Q14D     01/03/2021 Imaging   CT A/P IMPRESSION: 1. Wall thickening  of the terminal ileum again seen. Fluid-filled distal small bowel, ascending, and transverse colon without obstruction. 2. Equivocal improvement in omental and peritoneal metastatic disease with slightly improved small volume perihepatic and perisplenic  ascites. Right lower quadrant mesenteric adenopathy is similar or mildly improved. 3. Sigmoid colonic diverticulosis. Mild mural wall thickening in the sigmoid, no acute diverticulitis. 4. Gastrostomy tube in place, balloon normally positioned in the stomach. 5. Chronic bilateral L5 pars interarticularis defects with trace anterolisthesis of L5 on S1. Chronic avascular necrosis of the left femoral head.   01/16/2021 Genetic Testing   Negative genetic testing. CTNNA1 [id number] VUS, RAD51D p.G140E VUS and TSC1 p.R768H VUS found on the CancerNext-Expanded+RNAinsight.  The CancerNext-Expanded gene panel offered by [facility] and includes sequencing and rearrangement analysis for the following 77 genes: AIP, ALK, APC*, ATM*, AXIN2, BAP1, BARD1, BLM, BMPR1A, BRCA1*, BRCA2*, BRIP1*, CDC73, CDH1*, CDK4, CDKN1B, CDKN2A, CHEK2*, CTNNA1, DICER1, FANCC, FH, FLCN, GALNT12, KIF1B, LZTR1, MAX, MEN1, MET, MLH1*, MSH2*, MSH3, MSH6*, MUTYH*, NBN, NF1*, NF2, NTHL1, PALB2*, PHOX2B, PMS2*, POT1, PRKAR1A, PTCH1, PTEN*, RAD51C*, RAD51D*, RB1, RECQL, RET, SDHA, SDHAF2, SDHB, SDHC, SDHD, SMAD4, SMARCA4, SMARCB1, SMARCE1, STK11, SUFU, TMEM127, TP53*, TSC1, TSC2, VHL and XRCC2 (sequencing and deletion/duplication); EGFR, EGLN1, HOXB13, KIT, MITF, PDGFRA, POLD1, and POLE (sequencing only); EPCAM and GREM1 (deletion/duplication only). DNA and RNA analyses performed for * genes. The report date is Jan 16, 2021.   02/24/2021 Imaging   CT AP  IMPRESSION: 1. Interval resolution of previously seen small volume ascites throughout the abdomen and pelvis. There is redemonstrated, ill-defined stranding and thickening of the peritoneal surfaces and omentum, which is somewhat diminished compared to prior examination. 2. Interval improvement in right lower quadrant mesocolon lymph nodes. 3. Findings are consistent with treatment response of nodal and peritoneal metastatic disease. 4. Unchanged, matted appearing thickening of  the terminal ileum and cecal base. 5. Sigmoid diverticulosis with wall thickening of the mid to distal sigmoid, similar to prior examination. Findings are most suggestive of chronic sequelae of diverticulitis.   Aortic Atherosclerosis (ICD10-I70.0).   03/13/2021 -  Chemotherapy   Patient is on Treatment Plan : COLORECTAL FOLFOXIRI + Bevacizumab q14d     05/13/2021 Procedure   OPERATIVE PROCEDURE: Laparoscopy and peritoneal biopsy.  SURGEON: Merlyn Albert. Clovis Riley, MD   05/13/2021 Pathology Results   Final Pathologic Diagnosis    A. PERITONEAL, BIOPSY :              Metastatic adenocarcinoma.               See comment.    Comment    The biopsies show predominately fibrosis  and fibroadipose tissue with a small focus of moderately differentiated adenocarcinoma. Given the patient's history, the findings favor metastasis of the patient's known colonic cancer.     07/01/2021 Imaging   CT CAP  IMPRESSION: Chest Impression:   1. No evidence of thoracic metastasis.   Abdomen / Pelvis Impression:   1. Thickening through the terminal ileum similar to prior. No ileocecal lymphadenopathy. 2. One focus linear thickening the peritoneum adjacent to loops of small bowel in the ventral abdomen which extend to the proximal sigmoid colon are more prominent than prior and concerning for residual peritoneal carcinoma. 3. No evidence of solid organ metastasis in the abdomen pelvis.      INTERVAL HISTORY:  Ricardo [name] is here for a follow up of metastatic colon cancer. He was last seen by me on 06/05/21 and by NP Lacie in the interim. He presents  to the clinic alone. He reports he has felt the best he's felt in a long time. He notes his wife's mother isn't doing well, so she will leave to visit her mother in December. He notes his son will come over to stay with him.   All other systems were reviewed with the patient and are negative.  MEDICAL HISTORY:  Past Medical History:  Diagnosis  Date   Arthritis    Colon cancer ([place])    with metastasis   Family history of adverse reaction to anesthesia    mother had problem with it due to her asthma   Family history of breast cancer    Family history of prostate cancer     SURGICAL HISTORY: Past Surgical History:  Procedure Laterality Date   BOWEL RESECTION N/A 11/29/2020   Procedure: PARTIAL BOWEL RESECTION;  Surgeon: Jovita Kussmaul, MD;  Location: [place];  Service: General;  Laterality: N/A;   GASTROSTOMY Left 11/29/2020   Procedure: INSERTION OF GASTROSTOMY TUBE;  Surgeon: Jovita Kussmaul, MD;  Location: [place];  Service: General;  Laterality: Left;   IR IMAGING GUIDED PORT INSERTION  12/12/2020   LAPAROSCOPIC APPENDECTOMY N/A 01/17/2019   Procedure: APPENDECTOMY LAPAROSCOPIC;  Surgeon: Ralene Ok, MD;  Location: [place];  Service: General;  Laterality: N/A;   LAPAROTOMY N/A 11/29/2020   Procedure: EXPLORATORY LAPAROTOMY;  Surgeon: Jovita Kussmaul, MD;  Location: [place];  Service: General;  Laterality: N/A;  PUT CASE IN ROOM 1 STARTING AT 9:30AM FOR 120 MIN   OSTOMY N/A 11/29/2020   Procedure: POSSIBLE OSTOMY CREATION;  Surgeon: Jovita Kussmaul, MD;  Location: [place];  Service: General;  Laterality: N/A;   RECTAL BIOPSY N/A 11/29/2020   Procedure: PERITIONEAL BIOPSY;  Surgeon: Jovita Kussmaul, MD;  Location: [place];  Service: General;  Laterality: N/A;   SHOULDER SURGERY Left 09/19/2015    I have reviewed the social history and family history with the patient and they are unchanged from previous note.  ALLERGIES:  is allergic to oxaliplatin.  MEDICATIONS:  Current Outpatient Medications  Medication Sig Dispense Refill   acetaminophen (TYLENOL) 500 MG tablet Take 1,000 mg by mouth every 6 (six) hours as needed for mild pain.     dicyclomine (BENTYL) 10 MG capsule TAKE 1 CAPSULE (10 MG TOTAL) BY MOUTH 4 (FOUR) TIMES DAILY - BEFORE MEALS AND AT BEDTIME. 120 capsule 1   diphenoxylate-atropine (LOMOTIL) 2.5-0.025 MG tablet Take 2  tablets by mouth 4 (four) times daily as needed for diarrhea or loose stools. 60 tablet 1   hydrocortisone (ANUSOL-HC) 2.5 % rectal cream Place 1 application rectally 2 (two) times daily. 30 g 1   ondansetron (ZOFRAN) 8 MG tablet TAKE 1 TABLET BY MOUTH 2TIMES DAILY AS NEEDED FOR NAUSEA/VOMITING. START ON DAY3 AFTER CHEMOTHERAPY.     oxyCODONE (OXY IR/ROXICODONE) 5 MG immediate release tablet Take 1 tablet (5 mg total) by mouth every 6 (six) hours as needed for severe pain. 60 tablet 0   prochlorperazine (COMPAZINE) 10 MG tablet Take 1 tablet (10 mg total) by mouth every 6 (six) hours as needed for nausea or vomiting. 30 tablet 2   zolpidem (AMBIEN) 5 MG tablet Take 1 tablet (5 mg total) by mouth at bedtime as needed for sleep. 30 tablet 0   Current Facility-Administered Medications  Medication Dose Route Frequency Provider Last Rate Last Admin   0.9 %  sodium chloride infusion   Intravenous PRN Causey, Charlestine Massed, NP  PHYSICAL EXAMINATION: ECOG PERFORMANCE STATUS: 1 - Symptomatic but completely ambulatory  Vitals:   07/03/21 0957  BP: 123/87  Pulse: 96  Resp: 18  Temp: 98.6 F (37 C)  SpO2: 97%   Wt Readings from Last 3 Encounters:  07/03/21 260 lb 1.6 oz (118 kg)  06/19/21 261 lb 6.4 oz (118.6 kg)  06/05/21 259 lb 6.4 oz (117.7 kg)     GENERAL:alert, no distress and comfortable SKIN: skin color, texture, turgor are normal, no rashes or significant lesions EYES: normal, Conjunctiva are pink and non-injected, sclera clear  ABDOMEN:abdomen soft, non-tender and normal bowel sounds Musculoskeletal:no cyanosis of digits and no clubbing  NEURO: alert & oriented x 3 with fluent speech, no focal motor/sensory deficits  LABORATORY DATA:  I have reviewed the data as listed CBC Latest Ref Rng & Units 07/03/2021 06/19/2021 06/05/2021  WBC 4.0 - 10.5 K/uL 7.7 12.8(H) 9.9  Hemoglobin 13.0 - 17.0 g/dL 14.0 14.6 14.7  Hematocrit 39.0 - 52.0 % 40.3 42.5 43.6  Platelets 150 - 400  K/uL 269 250 250     CMP Latest Ref Rng & Units 07/03/2021 06/19/2021 06/05/2021  Glucose 70 - 99 mg/dL 84 108(H) 116(H)  BUN 6 - 20 mg/dL '17 14 18  ' Creatinine 0.61 - 1.24 mg/dL 1.18 1.28(H) 1.21  Sodium 135 - 145 mmol/L 139 141 139  Potassium 3.5 - 5.1 mmol/L 4.5 4.1 4.1  Chloride 98 - 111 mmol/L 106 107 106  CO2 22 - 32 mmol/L '22 24 23  ' Calcium 8.9 - 10.3 mg/dL 9.5 9.3 9.7  Total Protein 6.5 - 8.1 g/dL 7.6 7.8 7.9  Total Bilirubin 0.3 - 1.2 mg/dL 0.4 0.4 0.5  Alkaline Phos 38 - 126 U/L 114 140(H) 137(H)  AST 15 - 41 U/L '20 17 22  ' ALT 0 - 44 U/L 34 19 31      RADIOGRAPHIC STUDIES: I have personally reviewed the radiological images as listed and agreed with the findings in the report. No results found.    Orders Placed This Encounter  Procedures   Total Protein, Urine dipstick    Standing Status:   Standing    Number of Occurrences:   20    Standing Expiration Date:   07/03/2022   All questions were answered. The patient knows to call the clinic with any problems, questions or concerns. No barriers to learning was detected. The total time spent in the appointment was 30 minutes.     [name], MD 07/03/2021   I, Wilburn Mylar, am acting as scribe for [name], MD.   I have reviewed the above documentation for accuracy and completeness, and I agree with the above.

## 2021-07-03 NOTE — Patient Instructions (Signed)
[facility]  Discharge Instructions: Thank you for choosing [facility] to provide your oncology and hematology care.   If you have a lab appointment with the [facility], please go directly to the [facility] and check in at the registration area.   Wear comfortable clothing and clothing appropriate for easy access to any Portacath or PICC line.   We strive to give you quality time with your provider. You may need to reschedule your appointment if you arrive late (15 or more minutes).  Arriving late affects you and other patients whose appointments are after yours.  Also, if you miss three or more appointments without notifying the office, you may be dismissed from the clinic at the provider's discretion.      For prescription refill requests, have your pharmacy contact our office and allow 72 hours for refills to be completed.    Today you received the following chemotherapy and/or immunotherapy agents Bevacizumab, irinotecan, leucovorin, and 5 FU      To help prevent nausea and vomiting after your treatment, we encourage you to take your nausea medication as directed.  BELOW ARE SYMPTOMS THAT SHOULD BE REPORTED IMMEDIATELY: *FEVER GREATER THAN 100.4 F (38 C) OR HIGHER *CHILLS OR SWEATING *NAUSEA AND VOMITING THAT IS NOT CONTROLLED WITH YOUR NAUSEA MEDICATION *UNUSUAL SHORTNESS OF BREATH *UNUSUAL BRUISING OR BLEEDING *URINARY PROBLEMS (pain or burning when urinating, or frequent urination) *BOWEL PROBLEMS (unusual diarrhea, constipation, pain near the anus) TENDERNESS IN MOUTH AND THROAT WITH OR WITHOUT PRESENCE OF ULCERS (sore throat, sores in mouth, or a toothache) UNUSUAL RASH, SWELLING OR PAIN  UNUSUAL VAGINAL DISCHARGE OR ITCHING   Items with * indicate a potential emergency and should be followed up as soon as possible or go to the Emergency Department if any problems should occur.  Please show the CHEMOTHERAPY ALERT CARD or  IMMUNOTHERAPY ALERT CARD at check-in to the Emergency Department and triage nurse.  Should you have questions after your visit or need to cancel or reschedule your appointment, please contact [facility]  Dept: [phone]  and follow the prompts.  Office hours are 8:00 a.m. to 4:30 p.m. Monday - Friday. Please note that voicemails left after 4:00 p.m. may not be returned until the following business day.  We are closed weekends and major holidays. You have access to a nurse at all times for urgent questions. Please call the main number to the clinic Dept: [phone] and follow the prompts.   For any non-urgent questions, you may also contact your provider using MyChart. We now offer e-Visits for anyone 56 and older to request care online for non-urgent symptoms. For details visit [email or web address].   Also download the MyChart app! Go to the app store, search "MyChart", open the app, select [facility], and log in with your MyChart username and password.  Due to Covid, a mask is required upon entering the hospital/clinic. If you do not have a mask, one will be given to you upon arrival. For doctor visits, patients may have 1 support person aged 62 or older with them. For treatment visits, patients cannot have anyone with them due to current Covid guidelines and our immunocompromised population.

## 2021-07-03 NOTE — Progress Notes (Signed)
Confirmed w/ Dr. Burr Medico that ok to resume Bevacizumab.  Abdominal wound has healed. Dose of Bevacizumab will be recalculated; pt has gained wt. Pt has h/o Oxaliplatin hypersensitivity.  Additional premeds added 04/10/21.  Since pt no longer on Oxaliplatin, those premeds (Pepcid, Claritin & Solumedrol) will be d/c'd going forward. Reviewed w/ Dr. Burr Medico who is in agreement.  Kennith Center, Pharm.D., CPP 07/03/2021@11 :20 AM

## 2021-07-04 ENCOUNTER — Telehealth: Payer: Self-pay | Admitting: Hematology

## 2021-07-04 NOTE — Telephone Encounter (Signed)
Left message with follow-up appointments per 10/20 los. 

## 2021-07-05 ENCOUNTER — Other Ambulatory Visit: Payer: Self-pay

## 2021-07-05 ENCOUNTER — Inpatient Hospital Stay: Payer: BC Managed Care – PPO

## 2021-07-05 VITALS — BP 137/82 | HR 99 | Temp 97.8°F | Resp 19

## 2021-07-05 DIAGNOSIS — C182 Malignant neoplasm of ascending colon: Secondary | ICD-10-CM | POA: Diagnosis not present

## 2021-07-05 MED ORDER — PEGFILGRASTIM-CBQV 6 MG/0.6ML ~~LOC~~ SOSY
6.0000 mg | PREFILLED_SYRINGE | Freq: Once | SUBCUTANEOUS | Status: AC
  Administered 2021-07-05: 6 mg via SUBCUTANEOUS

## 2021-07-05 MED ORDER — HEPARIN SOD (PORK) LOCK FLUSH 100 UNIT/ML IV SOLN
500.0000 [IU] | Freq: Once | INTRAVENOUS | Status: AC | PRN
Start: 2021-07-05 — End: 2021-07-05
  Administered 2021-07-05: 500 [IU]

## 2021-07-05 MED ORDER — SODIUM CHLORIDE 0.9% FLUSH
10.0000 mL | INTRAVENOUS | Status: DC | PRN
Start: 2021-07-05 — End: 2021-07-05
  Administered 2021-07-05: 10 mL

## 2021-07-05 MED ORDER — SODIUM CHLORIDE 0.9 % IV SOLN: INTRAVENOUS | Status: DC

## 2021-07-05 NOTE — Patient Instructions (Signed)

## 2021-07-16 MED FILL — Fosaprepitant Dimeglumine For IV Infusion 150 MG (Base Eq): INTRAVENOUS | Qty: 5 | Status: AC

## 2021-07-16 MED FILL — Dexamethasone Sodium Phosphate Inj 100 MG/10ML: INTRAMUSCULAR | Qty: 1 | Status: AC

## 2021-07-17 ENCOUNTER — Inpatient Hospital Stay (HOSPITAL_BASED_OUTPATIENT_CLINIC_OR_DEPARTMENT_OTHER): Payer: BC Managed Care – PPO | Admitting: Hematology

## 2021-07-17 ENCOUNTER — Inpatient Hospital Stay: Payer: BC Managed Care – PPO

## 2021-07-17 ENCOUNTER — Other Ambulatory Visit: Payer: Self-pay

## 2021-07-17 ENCOUNTER — Inpatient Hospital Stay: Payer: BC Managed Care – PPO | Attending: Nurse Practitioner

## 2021-07-17 VITALS — BP 137/87 | HR 99 | Temp 98.3°F | Resp 18 | Ht 71.0 in | Wt 260.9 lb

## 2021-07-17 DIAGNOSIS — C786 Secondary malignant neoplasm of retroperitoneum and peritoneum: Secondary | ICD-10-CM

## 2021-07-17 DIAGNOSIS — Z79899 Other long term (current) drug therapy: Secondary | ICD-10-CM | POA: Diagnosis not present

## 2021-07-17 DIAGNOSIS — Z95828 Presence of other vascular implants and grafts: Secondary | ICD-10-CM

## 2021-07-17 DIAGNOSIS — C182 Malignant neoplasm of ascending colon: Secondary | ICD-10-CM | POA: Diagnosis present

## 2021-07-17 DIAGNOSIS — C772 Secondary and unspecified malignant neoplasm of intra-abdominal lymph nodes: Secondary | ICD-10-CM | POA: Diagnosis not present

## 2021-07-17 DIAGNOSIS — Z5111 Encounter for antineoplastic chemotherapy: Secondary | ICD-10-CM | POA: Insufficient documentation

## 2021-07-17 LAB — CBC WITH DIFFERENTIAL (CANCER CENTER ONLY)
Abs Immature Granulocytes: 0.06 10*3/uL (ref 0.00–0.07)
Basophils Absolute: 0 10*3/uL (ref 0.0–0.1)
Basophils Relative: 0 %
Eosinophils Absolute: 0.1 10*3/uL (ref 0.0–0.5)
Eosinophils Relative: 1 %
HCT: 42.2 % (ref 39.0–52.0)
Hemoglobin: 14.4 g/dL (ref 13.0–17.0)
Immature Granulocytes: 0 %
Lymphocytes Relative: 9 %
Lymphs Abs: 1.2 10*3/uL (ref 0.7–4.0)
MCH: 31 pg (ref 26.0–34.0)
MCHC: 34.1 g/dL (ref 30.0–36.0)
MCV: 90.8 fL (ref 80.0–100.0)
Monocytes Absolute: 0.6 10*3/uL (ref 0.1–1.0)
Monocytes Relative: 4 %
Neutro Abs: 11.9 10*3/uL — ABNORMAL HIGH (ref 1.7–7.7)
Neutrophils Relative %: 86 %
Platelet Count: 196 10*3/uL (ref 150–400)
RBC: 4.65 MIL/uL (ref 4.22–5.81)
RDW: 14.6 % (ref 11.5–15.5)
WBC Count: 13.8 10*3/uL — ABNORMAL HIGH (ref 4.0–10.5)
nRBC: 0 % (ref 0.0–0.2)

## 2021-07-17 LAB — CMP (CANCER CENTER ONLY)
ALT: 22 U/L (ref 0–44)
AST: 14 U/L — ABNORMAL LOW (ref 15–41)
Albumin: 3.8 g/dL (ref 3.5–5.0)
Alkaline Phosphatase: 132 U/L — ABNORMAL HIGH (ref 38–126)
Anion gap: 9 (ref 5–15)
BUN: 13 mg/dL (ref 6–20)
CO2: 24 mmol/L (ref 22–32)
Calcium: 9.1 mg/dL (ref 8.9–10.3)
Chloride: 104 mmol/L (ref 98–111)
Creatinine: 1.11 mg/dL (ref 0.61–1.24)
GFR, Estimated: >60 mL/min (ref >60)
Glucose, Bld: 98 mg/dL (ref 70–99)
Potassium: 4.1 mmol/L (ref 3.5–5.1)
Sodium: 137 mmol/L (ref 135–145)
Total Bilirubin: 0.5 mg/dL (ref 0.3–1.2)
Total Protein: 7.6 g/dL (ref 6.5–8.1)

## 2021-07-17 LAB — TOTAL PROTEIN, URINE DIPSTICK: Protein, ur: NEGATIVE mg/dL

## 2021-07-17 MED ORDER — SODIUM CHLORIDE 0.9 % IV SOLN
10.0000 mg | Freq: Once | INTRAVENOUS | Status: AC
  Administered 2021-07-17: 10 mg via INTRAVENOUS
  Filled 2021-07-17: qty 10

## 2021-07-17 MED ORDER — ALPRAZOLAM 0.25 MG PO TABS: 0.2500 mg | ORAL_TABLET | Freq: Every evening | ORAL | 0 refills | Status: DC | PRN

## 2021-07-17 MED ORDER — SODIUM CHLORIDE 0.9 % IV SOLN
150.0000 mg | Freq: Once | INTRAVENOUS | Status: AC
  Administered 2021-07-17: 150 mg via INTRAVENOUS
  Filled 2021-07-17: qty 150

## 2021-07-17 MED ORDER — ATROPINE SULFATE 1 MG/ML IV SOLN
0.5000 mg | Freq: Once | INTRAVENOUS | Status: AC | PRN
  Administered 2021-07-17: 0.5 mg via INTRAVENOUS
  Filled 2021-07-17: qty 1

## 2021-07-17 MED ORDER — PALONOSETRON HCL INJECTION 0.25 MG/5ML
0.2500 mg | Freq: Once | INTRAVENOUS | Status: AC
  Administered 2021-07-17: 0.25 mg via INTRAVENOUS
  Filled 2021-07-17: qty 5

## 2021-07-17 MED ORDER — SODIUM CHLORIDE 0.9 % IV SOLN
5.0000 mg/kg | Freq: Once | INTRAVENOUS | Status: AC
  Administered 2021-07-17: 600 mg via INTRAVENOUS
  Filled 2021-07-17: qty 16

## 2021-07-17 MED ORDER — SODIUM CHLORIDE 0.9 % IV SOLN: Freq: Once | INTRAVENOUS | Status: AC

## 2021-07-17 MED ORDER — SODIUM CHLORIDE 0.9 % IV SOLN: INTRAVENOUS | Status: DC

## 2021-07-17 MED ORDER — OXYCODONE HCL 5 MG PO TABS: 5.0000 mg | ORAL_TABLET | Freq: Three times a day (TID) | ORAL | 0 refills | Status: DC | PRN

## 2021-07-17 MED ORDER — SODIUM CHLORIDE 0.9 % IV SOLN
180.0000 mg/m2 | Freq: Once | INTRAVENOUS | Status: AC
  Administered 2021-07-17: 440 mg via INTRAVENOUS
  Filled 2021-07-17: qty 15

## 2021-07-17 MED ORDER — SODIUM CHLORIDE 0.9 % IV SOLN
2400.0000 mg/m2 | INTRAVENOUS | Status: DC
  Administered 2021-07-17: 5850 mg via INTRAVENOUS
  Filled 2021-07-17: qty 117

## 2021-07-17 MED ORDER — SODIUM CHLORIDE 0.9 % IV SOLN
400.0000 mg/m2 | Freq: Once | INTRAVENOUS | Status: AC
  Administered 2021-07-17: 972 mg via INTRAVENOUS
  Filled 2021-07-17: qty 48.6

## 2021-07-17 MED ORDER — SODIUM CHLORIDE 0.9% FLUSH
10.0000 mL | Freq: Once | INTRAVENOUS | Status: AC
  Administered 2021-07-17: 10 mL

## 2021-07-17 NOTE — Progress Notes (Signed)
[place]   Telephone:([phone]) [phone] Fax:(336) [phone]   Clinic Follow up Note   Patient Care Team: [name], MD as PCP - General (Internal Medicine) [name], MD as Consulting Physician (Oncology) [name], RN (Inactive) as Oncology Nurse Navigator  Date of Service:  07/17/2021  CHIEF COMPLAINT: f/u of metastatic colon cancer  CURRENT THERAPY:  First line FOLFOX q2 weeks starting 12/18/20. Bevacizumab added with cycle 2. Added Irinotecan from C6 on 03/13/21. Oxaliplatin discontinued after C7 due to allergy reactions   ASSESSMENT & PLAN:  Ricardo [name] is a 55 y.o. male with   1. 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 Work up showed mass in the terminal ileum with extensive omental nodularity and LN involvement. His baseline tumor marker CA 19-9 and CEA were normal. 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   This confirms stage IV metastatic cancer from cecum -He began first-line palliative chemo with FOLFOX on 12/18/2020, tolerated very well -FO shows KRAS/NRAS wildtype, but given his right side colon cancer, we do not use EGFR inhibitor and instead would recommend bevacizumab.  Bevacizumab was added with cycle 2 -CTAP w/o contrast from 02/24/2021 showed treatment response of nodal and peritoneal metastatic disease. -Treatment was escalated to [facility] with cycle 6 FOLFOX on 03/13/2021,  He tolerated well. Oxaliplatin discontinued with C7. -He underwent laparoscopy on 05/13/2021 by Dr. Clovis Riley, unfortunately due to his  extensive peritoneal metastasis he was not a candidate for cytoreductive surgery/HIPEC. -restaging CT CAP 07/01/21 showed overall stable disease. -He continues to tolerate FOLFIRI and bevacizumab with IVF and udenyca. His labs have remained good throughout treatment -due to longer recovery time after chemo, will reduce 5-fu to 2416m/m2    2. Symptom Management: Constipation, lower abdominal pain, abdominal bloating secondary #1 and partial bowl obstruction  -For 8-9 weeks before hospitalization he was having increasing lower abdominal pain and constipation.  -Since starting chemo he has been able to come off most supportive meds including oxycodone, nausea meds, and Lomotil. -Dicyclomine helps cramping -IVF on days 1 and 3 with pump d/c have helped   3. Genetic testing -He has brother with prostate cancer and sister with breast cancer in their 535s He is eligible for genetic testing.  -Negative result   4. Social Support -He is an avid cyclist. Last long distant riding was 06/2020 across [place]. He has not biked in the 8-9 weeks before cancer diagnosis due to symptoms. His weight heavily fluctuates with changes in his activity level, diet and biking. -He is married with 1 adult son. He notes he has good family support, very close with his wife and 271year old son who currently works in a school for troubled teens [place] His brother JJenny Reichmannis in town. He works as a tDesigner, television/film setat [employer]    5.  Goals of care discussion, full code -Due to his peritoneal metastasis, he is not a candidate for cytoreductive surgery with HIPEC -He understands his cancer is metastatic, not curable, but still treatable -He tolerates chemo with good performance status and is still very much interested in continuing cancer treatment  to live longer with good quality of life.       PLAN: -proceed with C9 FOLFIRI with Beva with reduced 5-fu dose              -add IVF to days 1 and 3 with each  cycle -Lab, flush, f/u, and chemo FOLFIRI in 2, 4, 6, and 9 weeks    No problem-specific Assessment & Plan notes found for this encounter.   SUMMARY OF ONCOLOGIC HISTORY: Oncology History Overview Note  Cancer Staging metastatic cecal cancer Staging form: Colon and Rectum, AJCC 8th Edition - Clinical stage from 11/29/2020: Stage IVC (cTX, cN2, pM1c) - Signed by [name], MD on 12/04/2020 Stage prefix: Initial diagnosis Histologic grade (G): G3 Histologic grading system: 4 grade system    metastatic cecal cancer  11/27/2020 Imaging   CT Angio CAP  IMPRESSION: 1. Thickening of the terminal ileum with associated proximal mid to distal small bowel obstruction. Findings could be due to an ileitis versus malignancy. Nonspecific mesenteric edema could be due to engorgement versus metastases. No associated bowel perforation. Recommend endoscopy for further evaluation. 2. Indeterminate right lower quadrant lymphadenopathy. 3. Scattered colonic diverticulosis with no acute diverticulitis. 4. Stable right hepatic lobe subcentimeter hyperdensity likely represents a hepatic hemangioma. 5. No acute vascular abnormality. Aortic Atherosclerosis (ICD10-I70.0) - mild. 6. No acute intrathoracic abnormality.   11/28/2020 Imaging   CT AP  IMPRESSION: 1. There is masslike, circumferential thickening of the terminal ileum and cecal base near the ileocecal valve and abnormally enlarged lymph nodes in the right lower quadrant mesentery adjacent to the terminal ileum measuring up to 2.3 x 1.4 cm. 2. There is extensive omental and peritoneal nodularity and caking throughout the abdomen. 3. Findings are highly concerning for primary colon malignancy with nodal and peritoneal metastatic disease. 4. Small volume perihepatic and perisplenic ascites. 5. The small bowel is generally decompressed, with full some fluid-filled, nondistended loops throughout. There is transit of oral enteric contrast to the  terminal ileum. No evidence of overt bowel obstruction at this time. Esophagogastric tube is position with tip and side port below the diaphragm. 6. Atelectasis or consolidation of the dependent bilateral lung bases, new compared to prior examination.   Aortic Atherosclerosis (ICD10-I70.0).     11/29/2020 Surgery   EXPLORATORY LAPAROTOMY, PARTIAL BOWEL RESECTION, POSSIBLE OSTOMY CREATION, PERITIONEAL BIOPSY, INSERTION OF GASTROSTOMY TUBE by Dr [name]   11/29/2020 Initial Biopsy   FINAL MICROSCOPIC DIAGNOSIS:   AB. 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    COMMENT:   Immunohistochemistry (IHC) for CK20 and CDX-2 is strong and diffusely  positive.  CK7, TTF-1, Synaptophysin, Chromogranin and CD56 are  negative.  The immunophenotype is compatible with origin from the lower  gastrointestinal tract.  IHC for MMR will be reported separately.  Case  preliminarily discussed with Dr. [name] on 12/02/2020.   At the request of Dr. [name], [phone] was reviewed in retrospect.  Review of the submitted sections confirms the presence of acute  appendicitis.  No malignancy is identified.    11/29/2020 Cancer Staging   Staging form: Colon and Rectum, AJCC 8th Edition - Clinical stage from 11/29/2020: Stage IVC (cTX, cN2, pM1c) - Signed by [name], MD on 12/04/2020 Stage prefix: Initial diagnosis Histologic grade (G): G3 Histologic grading system: 4 grade system    12/04/2020 Initial Diagnosis   Cancer of ascending colon metastatic to intra-abdominal lymph node [facility])    Chemotherapy  FOLFOX q2weeks    12/18/2020 - 02/15/2021 Chemotherapy      Patient is on Antibody Plan: COLORECTAL BEVACIZUMAB Q14D     01/03/2021 Imaging   CT A/P IMPRESSION: 1. Wall thickening of the terminal ileum again seen. Fluid-filled distal small bowel, ascending, and transverse colon without obstruction. 2. Equivocal  improvement in omental and peritoneal metastatic disease with slightly improved small volume perihepatic and perisplenic ascites. Right lower quadrant mesenteric adenopathy is similar or mildly improved. 3. Sigmoid colonic diverticulosis. Mild mural wall thickening in the sigmoid, no acute diverticulitis. 4. Gastrostomy tube in place, balloon normally positioned in the stomach. 5. Chronic bilateral L5 pars interarticularis defects with trace anterolisthesis of L5 on S1. Chronic avascular necrosis of the left femoral head.   01/16/2021 Genetic Testing   Negative genetic testing. CTNNA1 [id number] VUS, RAD51D p.G140E VUS and TSC1 p.R768H VUS found on the CancerNext-Expanded+RNAinsight.  The CancerNext-Expanded gene panel offered by [facility] and includes sequencing and rearrangement analysis for the following 77 genes: AIP, ALK, APC*, ATM*, AXIN2, BAP1, BARD1, BLM, BMPR1A, BRCA1*, BRCA2*, BRIP1*, CDC73, CDH1*, CDK4, CDKN1B, CDKN2A, CHEK2*, CTNNA1, DICER1, FANCC, FH, FLCN, GALNT12, KIF1B, LZTR1, MAX, MEN1, MET, MLH1*, MSH2*, MSH3, MSH6*, MUTYH*, NBN, NF1*, NF2, NTHL1, PALB2*, PHOX2B, PMS2*, POT1, PRKAR1A, PTCH1, PTEN*, RAD51C*, RAD51D*, RB1, RECQL, RET, SDHA, SDHAF2, SDHB, SDHC, SDHD, SMAD4, SMARCA4, SMARCB1, SMARCE1, STK11, SUFU, TMEM127, TP53*, TSC1, TSC2, VHL and XRCC2 (sequencing and deletion/duplication); EGFR, EGLN1, HOXB13, KIT, MITF, PDGFRA, POLD1, and POLE (sequencing only); EPCAM and GREM1 (deletion/duplication only). DNA and RNA analyses performed for * genes. The report date is Jan 16, 2021.   02/24/2021 Imaging   CT AP  IMPRESSION: 1. Interval resolution of previously seen small volume ascites throughout the abdomen and pelvis. There is redemonstrated, ill-defined stranding and thickening of the peritoneal surfaces and omentum, which is somewhat diminished compared to prior examination. 2. Interval improvement in right lower quadrant mesocolon lymph nodes. 3. Findings are  consistent with treatment response of nodal and peritoneal metastatic disease. 4. Unchanged, matted appearing thickening of the terminal ileum and cecal base. 5. Sigmoid diverticulosis with wall thickening of the mid to distal sigmoid, similar to prior examination. Findings are most suggestive of chronic sequelae of diverticulitis.   Aortic Atherosclerosis (ICD10-I70.0).   03/13/2021 -  Chemotherapy   Patient is on Treatment Plan : COLORECTAL FOLFOXIRI + Bevacizumab q14d     05/13/2021 Procedure   OPERATIVE PROCEDURE: Laparoscopy and peritoneal biopsy.  SURGEON: Merlyn Albert. Clovis Riley, MD   05/13/2021 Pathology Results   Final Pathologic Diagnosis    A. PERITONEAL, BIOPSY :              Metastatic adenocarcinoma.               See comment.    Comment    The biopsies show predominately fibrosis  and fibroadipose tissue with a small focus of moderately differentiated adenocarcinoma. Given the patient's history, the findings favor metastasis of the patient's known colonic cancer.     07/01/2021 Imaging   CT CAP  IMPRESSION: Chest Impression:   1. No evidence of thoracic metastasis.   Abdomen / Pelvis Impression:   1. Thickening through the terminal ileum similar to prior. No ileocecal lymphadenopathy. 2. One focus linear thickening the peritoneum adjacent to loops of small bowel in the ventral abdomen which extend to the proximal sigmoid colon are more prominent than prior and concerning for residual peritoneal carcinoma. 3. No evidence of solid organ metastasis in the abdomen pelvis.  INTERVAL HISTORY:  Ricardo [name] is here for a follow up of metastatic colon cancer. He was last seen by me on 07/03/21. He presents to the clinic alone. He reports he is tired today. He notes it is a mental tiredness.   All other systems were reviewed with the patient and are negative.  MEDICAL HISTORY:  Past Medical History:  Diagnosis Date   Arthritis    Colon cancer ([place])     with metastasis   Family history of adverse reaction to anesthesia    mother had problem with it due to her asthma   Family history of breast cancer    Family history of prostate cancer     SURGICAL HISTORY: Past Surgical History:  Procedure Laterality Date   BOWEL RESECTION N/A 11/29/2020   Procedure: PARTIAL BOWEL RESECTION;  Surgeon: Jovita Kussmaul, MD;  Location: [place];  Service: General;  Laterality: N/A;   GASTROSTOMY Left 11/29/2020   Procedure: INSERTION OF GASTROSTOMY TUBE;  Surgeon: Jovita Kussmaul, MD;  Location: [place];  Service: General;  Laterality: Left;   IR IMAGING GUIDED PORT INSERTION  12/12/2020   LAPAROSCOPIC APPENDECTOMY N/A 01/17/2019   Procedure: APPENDECTOMY LAPAROSCOPIC;  Surgeon: Ralene Ok, MD;  Location: [place];  Service: General;  Laterality: N/A;   LAPAROTOMY N/A 11/29/2020   Procedure: EXPLORATORY LAPAROTOMY;  Surgeon: Jovita Kussmaul, MD;  Location: [place];  Service: General;  Laterality: N/A;  PUT CASE IN ROOM 1 STARTING AT 9:30AM FOR 120 MIN   OSTOMY N/A 11/29/2020   Procedure: POSSIBLE OSTOMY CREATION;  Surgeon: Jovita Kussmaul, MD;  Location: [place];  Service: General;  Laterality: N/A;   RECTAL BIOPSY N/A 11/29/2020   Procedure: PERITIONEAL BIOPSY;  Surgeon: Jovita Kussmaul, MD;  Location: [place];  Service: General;  Laterality: N/A;   SHOULDER SURGERY Left 09/19/2015    I have reviewed the social history and family history with the patient and they are unchanged from previous note.  ALLERGIES:  is allergic to oxaliplatin.  MEDICATIONS:  Current Outpatient Medications  Medication Sig Dispense Refill   acetaminophen (TYLENOL) 500 MG tablet Take 1,000 mg by mouth every 6 (six) hours as needed for mild pain.     dicyclomine (BENTYL) 10 MG capsule TAKE 1 CAPSULE (10 MG TOTAL) BY MOUTH 4 (FOUR) TIMES DAILY - BEFORE MEALS AND AT BEDTIME. 120 capsule 1   diphenoxylate-atropine (LOMOTIL) 2.5-0.025 MG tablet Take 2 tablets by mouth 4 (four) times daily as needed  for diarrhea or loose stools. 60 tablet 1   hydrocortisone (ANUSOL-HC) 2.5 % rectal cream Place 1 application rectally 2 (two) times daily. 30 g 1   ondansetron (ZOFRAN) 8 MG tablet TAKE 1 TABLET BY MOUTH 2TIMES DAILY AS NEEDED FOR NAUSEA/VOMITING. START ON DAY3 AFTER CHEMOTHERAPY.     oxyCODONE (OXY IR/ROXICODONE) 5 MG immediate release tablet Take 1 tablet (5 mg total) by mouth every 6 (six) hours as needed for severe pain. 60 tablet 0   prochlorperazine (COMPAZINE) 10 MG tablet Take 1 tablet (10 mg total) by mouth every 6 (six) hours as needed for nausea or vomiting. 30 tablet 2   zolpidem (AMBIEN) 5 MG tablet Take 1 tablet (5 mg total) by mouth at bedtime as needed for sleep. 30 tablet 0   Current Facility-Administered Medications  Medication Dose Route Frequency Provider Last Rate Last Admin   0.9 %  sodium chloride infusion   Intravenous PRN Causey, Charlestine Massed, NP  PHYSICAL EXAMINATION: ECOG PERFORMANCE STATUS: 1 - Symptomatic but completely ambulatory  There were no vitals filed for this visit. Wt Readings from Last 3 Encounters:  07/03/21 260 lb 1.6 oz (118 kg)  06/19/21 261 lb 6.4 oz (118.6 kg)  06/05/21 259 lb 6.4 oz (117.7 kg)     GENERAL:alert, no distress and comfortable SKIN: skin color normal, no rashes or significant lesions EYES: normal, Conjunctiva are pink and non-injected, sclera clear  NEURO: alert & oriented x 3 with fluent speech  LABORATORY DATA:  I have reviewed the data as listed CBC Latest Ref Rng & Units 07/17/2021 07/03/2021 06/19/2021  WBC 4.0 - 10.5 K/uL 13.8(H) 7.7 12.8(H)  Hemoglobin 13.0 - 17.0 g/dL 14.4 14.0 14.6  Hematocrit 39.0 - 52.0 % 42.2 40.3 42.5  Platelets 150 - 400 K/uL 196 269 250     CMP Latest Ref Rng & Units 07/03/2021 06/19/2021 06/05/2021  Glucose 70 - 99 mg/dL 84 108(H) 116(H)  BUN 6 - 20 mg/dL _0 Creatinine 0.61 - 1.24 mg/dL 1.18 1.28(H) 1.21  Sodium 135 - 145 mmol/L 139 141 139  Potassium 3.5 - 5.1 mmol/L  4.5 4.1 4.1  Chloride 98 - 111 mmol/L 106 107 106  CO2 22 - 32 mmol/L _1 Calcium 8.9 - 10.3 mg/dL 9.5 9.3 9.7  Total Protein 6.5 - 8.1 g/dL 7.6 7.8 7.9  Total Bilirubin 0.3 - 1.2 mg/dL 0.4 0.4 0.5  Alkaline Phos 38 - 126 U/L 114 140(H) 137(H)  AST 15 - 41 U/L _2 ALT 0 - 44 U/L 34 19 31      RADIOGRAPHIC STUDIES: I have personally reviewed the radiological images as listed and agreed with the findings in the report. No results found.    No orders of the defined types were placed in this encounter.  All questions were answered. The patient knows to call the clinic with any problems, questions or concerns. No barriers to learning was detected. The total time spent in the appointment was 30 minutes.     [name], MD 07/17/2021   I, Wilburn Mylar, am acting as scribe for [name], MD.   I have reviewed the above documentation for accuracy and completeness, and I agree with the above.

## 2021-07-17 NOTE — Progress Notes (Signed)
Per Dr. Burr Medico, okay to adjust 5FU pump rate to infuse over 47 hours instead of 48 hours.  Adjusted rate is 5.3 ml/hr.  Calculations verified by Kasandra Knudsen, RN.

## 2021-07-17 NOTE — Patient Instructions (Signed)
[facility]COL[facility]  Discharge Instructions: Thank you for choosing [facility] to provide your oncology and hematology care.   If you have a lab appointment with the [facility], please go directly to the [facility] and check in at the registration area.   Wear comfortable clothing and clothing appropriate for easy access to any Portacath or PICC line.   We strive to give you quality time with your provider. You may need to reschedule your appointment if you arrive late (15 or more minutes).  Arriving late affects you and other patients whose appointments are after yours.  Also, if you miss three or more appointments without notifying the office, you may be dismissed from the clinic at the provider's discretion.      For prescription refill requests, have your pharmacy contact our office and allow 72 hours for refills to be completed.    Today you received the following chemotherapy and/or immunotherapy agents: bevacizumab (Avastin), irinotecan, leucovorin, fluorouracil (Adrucil).   To help prevent nausea and vomiting after your treatment, we encourage you to take your nausea medication as directed.  BELOW ARE SYMPTOMS THAT SHOULD BE REPORTED IMMEDIATELY: *FEVER GREATER THAN 100.4 F (38 C) OR HIGHER *CHILLS OR SWEATING *NAUSEA AND VOMITING THAT IS NOT CONTROLLED WITH YOUR NAUSEA MEDICATION *UNUSUAL SHORTNESS OF BREATH *UNUSUAL BRUISING OR BLEEDING *URINARY PROBLEMS (pain or burning when urinating, or frequent urination) *BOWEL PROBLEMS (unusual diarrhea, constipation, pain near the anus) TENDERNESS IN MOUTH AND THROAT WITH OR WITHOUT PRESENCE OF ULCERS (sore throat, sores in mouth, or a toothache) UNUSUAL RASH, SWELLING OR PAIN  UNUSUAL VAGINAL DISCHARGE OR ITCHING   Items with * indicate a potential emergency and should be followed up as soon as possible or go to the Emergency Department if any problems should occur.  Please show the  CHEMOTHERAPY ALERT CARD or IMMUNOTHERAPY ALERT CARD at check-in to the Emergency Department and triage nurse.  Should you have questions after your visit or need to cancel or reschedule your appointment, please contact [facility]  Dept: [phone]  and follow the prompts.  Office hours are 8:00 a.m. to 4:30 p.m. Monday - Friday. Please note that voicemails left after 4:00 p.m. may not be returned until the following business day.  We are closed weekends and major holidays. You have access to a nurse at all times for urgent questions. Please call the main number to the clinic Dept: [phone] and follow the prompts.   For any non-urgent questions, you may also contact your provider using MyChart. We now offer e-Visits for anyone 4 and older to request care online for non-urgent symptoms. For details visit [email or web address].   Also download the MyChart app! Go to the app store, search "MyChart", open the app, select [facility], and log in with your MyChart username and password.  Due to Covid, a mask is required upon entering the hospital/clinic. If you do not have a mask, one will be given to you upon arrival. For doctor visits, patients may have 1 support person aged 45 or older with them. For treatment visits, patients cannot have anyone with them due to current Covid guidelines and our immunocompromised population.

## 2021-07-19 ENCOUNTER — Encounter: Payer: Self-pay | Admitting: Hematology

## 2021-07-19 ENCOUNTER — Inpatient Hospital Stay: Payer: BC Managed Care – PPO

## 2021-07-19 ENCOUNTER — Other Ambulatory Visit: Payer: Self-pay

## 2021-07-19 VITALS — BP 127/81 | HR 76 | Temp 98.1°F | Resp 18

## 2021-07-19 DIAGNOSIS — C182 Malignant neoplasm of ascending colon: Secondary | ICD-10-CM

## 2021-07-19 MED ORDER — SODIUM CHLORIDE 0.9% FLUSH
10.0000 mL | INTRAVENOUS | Status: DC | PRN
  Administered 2021-07-19: 10 mL

## 2021-07-19 MED ORDER — HEPARIN SOD (PORK) LOCK FLUSH 100 UNIT/ML IV SOLN
500.0000 [IU] | Freq: Once | INTRAVENOUS | Status: AC | PRN
  Administered 2021-07-19: 500 [IU]

## 2021-07-19 MED ORDER — PEGFILGRASTIM-CBQV 6 MG/0.6ML ~~LOC~~ SOSY
6.0000 mg | PREFILLED_SYRINGE | Freq: Once | SUBCUTANEOUS | Status: AC
  Administered 2021-07-19: 6 mg via SUBCUTANEOUS
  Filled 2021-07-19: qty 0.6

## 2021-07-19 MED ORDER — SODIUM CHLORIDE 0.9 % IV SOLN: INTRAVENOUS | Status: DC

## 2021-07-19 NOTE — Patient Instructions (Signed)

## 2021-07-21 ENCOUNTER — Telehealth: Payer: Self-pay | Admitting: Hematology

## 2021-07-21 NOTE — Telephone Encounter (Signed)
Left message with follow-up appointments per 11/3 los. 

## 2021-07-29 NOTE — Progress Notes (Signed)
[place] [facility]   [phone] [phone]   Clinic Follow up Note   Patient Care Team: [name], MD as PCP - General (Internal Medicine) [name], MD as Consulting Physician (Oncology) [name], RN (Inactive) as Oncology Nurse Navigator 07/31/2021  CHIEF COMPLAINT:  Follow up metastatic colon cancer   SUMMARY OF ONCOLOGIC HISTORY: Oncology History Overview Note  Cancer Staging metastatic cecal cancer Staging form: Colon and Rectum, AJCC 8th Edition - Clinical stage from 11/29/2020: Stage IVC (cTX, cN2, pM1c) - Signed by [name], MD on 12/04/2020 Stage prefix: Initial diagnosis Histologic grade (G): G3 Histologic grading system: 4 grade system    metastatic cecal cancer  11/27/2020 Imaging   CT Angio CAP  IMPRESSION: 1. Thickening of the terminal ileum with associated proximal mid to distal small bowel obstruction. Findings could be due to an ileitis versus malignancy. Nonspecific mesenteric edema could be due to engorgement versus metastases. No associated bowel perforation. Recommend endoscopy for further evaluation. 2. Indeterminate right lower quadrant lymphadenopathy. 3. Scattered colonic diverticulosis with no acute diverticulitis. 4. Stable right hepatic lobe subcentimeter hyperdensity likely represents a hepatic hemangioma. 5. No acute vascular abnormality. Aortic Atherosclerosis (ICD10-I70.0) - mild. 6. No acute intrathoracic abnormality.   11/28/2020 Imaging   CT AP  IMPRESSION: 1. There is masslike, circumferential thickening of the terminal ileum and cecal base near the ileocecal valve and abnormally enlarged lymph nodes in the right lower quadrant mesentery adjacent to the terminal ileum measuring up to 2.3 x 1.4 cm. 2. There is extensive omental and peritoneal nodularity and caking throughout the abdomen. 3. Findings are highly concerning for primary colon malignancy with nodal and peritoneal  metastatic disease. 4. Small volume perihepatic and perisplenic ascites. 5. The small bowel is generally decompressed, with full some fluid-filled, nondistended loops throughout. There is transit of oral enteric contrast to the terminal ileum. No evidence of overt bowel obstruction at this time. Esophagogastric tube is position with tip and side port below the diaphragm. 6. Atelectasis or consolidation of the dependent bilateral lung bases, new compared to prior examination.   Aortic Atherosclerosis (ICD10-I70.0).     11/29/2020 Surgery   EXPLORATORY LAPAROTOMY, PARTIAL BOWEL RESECTION, POSSIBLE OSTOMY CREATION, PERITIONEAL BIOPSY, INSERTION OF GASTROSTOMY TUBE by Dr [name]   11/29/2020 Initial Biopsy   FINAL MICROSCOPIC DIAGNOSIS:   AB. 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    COMMENT:   Immunohistochemistry (IHC) for CK20 and CDX-2 is strong and diffusely  positive.  CK7, TTF-1, Synaptophysin, Chromogranin and CD56 are  negative.  The immunophenotype is compatible with origin from the lower  gastrointestinal tract.  IHC for MMR will be reported separately.  Case  preliminarily discussed with Dr. [name] on 12/02/2020.   At the request of Dr. [name], [phone] was reviewed in retrospect.  Review of the submitted sections confirms the presence of acute  appendicitis.  No malignancy is identified.    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 Wall thickening of the terminal ileum again seen. Fluid-filled distal small bowel, ascending, and transverse colon without obstruction. 2. Equivocal improvement in omental and peritoneal metastatic disease with slightly improved small volume perihepatic and perisplenic ascites. Right lower quadrant mesenteric adenopathy is similar or mildly improved. 3. Sigmoid colonic diverticulosis. Mild mural wall thickening in the sigmoid, no acute diverticulitis. 4. Gastrostomy tube in place, balloon normally positioned in the stomach. 5. Chronic bilateral L5 pars interarticularis defects with trace anterolisthesis of L5 on S1. Chronic avascular necrosis of the left femoral head.   01/16/2021 Genetic Testing   Negative genetic testing. CTNNA1 [id number] VUS, RAD51D p.G140E VUS and TSC1 p.R768H VUS found on the CancerNext-Expanded+RNAinsight.  The CancerNext-Expanded gene panel offered by [facility] and includes sequencing and rearrangement analysis for the following 77 genes: AIP, ALK, APC*, ATM*, AXIN2, BAP1, BARD1, BLM, BMPR1A, BRCA1*, BRCA2*, BRIP1*, CDC73, CDH1*, CDK4, CDKN1B, CDKN2A, CHEK2*, CTNNA1, DICER1, FANCC, FH, FLCN, GALNT12, KIF1B, LZTR1, MAX, MEN1, MET, MLH1*, MSH2*, MSH3, MSH6*, MUTYH*, NBN, NF1*, NF2, NTHL1, PALB2*, PHOX2B, PMS2*, POT1, PRKAR1A, PTCH1, PTEN*, RAD51C*, RAD51D*, RB1, RECQL, RET, SDHA, SDHAF2, SDHB, SDHC, SDHD, SMAD4, SMARCA4, SMARCB1, SMARCE1, STK11, SUFU, TMEM127, TP53*, TSC1, TSC2, VHL and XRCC2 (sequencing and deletion/duplication); EGFR, EGLN1, HOXB13, KIT, MITF, PDGFRA, POLD1, and POLE (sequencing only); EPCAM and GREM1 (deletion/duplication only). DNA and RNA analyses performed for * genes. The report date is Jan 16, 2021.   02/24/2021 Imaging   CT AP  IMPRESSION: 1. Interval resolution of previously seen small volume ascites throughout the abdomen and pelvis. There is  redemonstrated, ill-defined stranding and thickening of the peritoneal surfaces and omentum, which is somewhat diminished compared to prior examination. 2. Interval improvement in right lower quadrant mesocolon lymph nodes. 3. Findings are consistent with treatment response of nodal and peritoneal metastatic disease. 4. Unchanged, matted appearing thickening of the terminal ileum and cecal base. 5. Sigmoid diverticulosis with wall thickening of the mid to distal sigmoid, similar to prior examination. Findings are most suggestive of chronic sequelae of diverticulitis.   Aortic Atherosclerosis (ICD10-I70.0).   03/13/2021 -  Chemotherapy   Patient is on Treatment Plan : COLORECTAL FOLFOXIRI + Bevacizumab q14d     05/13/2021 Procedure   OPERATIVE PROCEDURE: Laparoscopy and peritoneal biopsy.  SURGEON: Merlyn Albert. Clovis Riley, MD   05/13/2021 Pathology Results   Final Pathologic Diagnosis    A. PERITONEAL, BIOPSY :              Metastatic adenocarcinoma.               See comment.    Comment    The biopsies show predominately fibrosis  and fibroadipose tissue with a small focus of moderately differentiated adenocarcinoma. Given the patient's history, the findings favor metastasis of the patient's known colonic cancer.     07/01/2021 Imaging   CT CAP  IMPRESSION: Chest Impression:   1. No evidence of thoracic metastasis.   Abdomen / Pelvis Impression:   1. Thickening through the terminal ileum similar to prior. No ileocecal lymphadenopathy. 2. One focus linear thickening the peritoneum adjacent to loops of small bowel in the ventral abdomen which extend to the proximal sigmoid colon are more prominent than prior and concerning for residual peritoneal carcinoma. 3. No evidence of solid organ metastasis in the abdomen pelvis.     CURRENT THERAPY: First line FOLFOX q2 weeks starting 12/18/20. Bevacizumab added with cycle 2. Added  Irinotecan from C6 on 03/13/21. Oxaliplatin discontinued  after C7 due to allergy reactions. 5FU decreased to 2400 mg on 07/17/21 C9  INTERVAL HISTORY: Mr. Desa returns for follow up and treatment as scheduled. Last seen 07/17/21 and completed another cycle of FOLFIRI/beva.  He tolerated last cycle better and had the best 6 days he has had since August. He went hiking over the weekend but had to rest.  It takes him 6 days to recover from chemo; drinks cold milk and ice cream to "cool me down" after chemo.  Overall his bowels are functioning better, neuropathy has improved, and he is hearing better on chemo.  He has occasional nosebleed.  Denies pain.  He gained weight in preparation for surgery but was then not a candidate, he is now "dieting" to get to his goal 220-230 pounds.  He notes he is getting "PTSD" from coming here, gets nauseous and occasionally vomits in the parking lot.  He brought Xanax to take prior to chemo.  All other systems were reviewed with the patient and are negative.  MEDICAL HISTORY:  Past Medical History:  Diagnosis Date   Arthritis    Colon cancer ([place])    with metastasis   Family history of adverse reaction to anesthesia    mother had problem with it due to her asthma   Family history of breast cancer    Family history of prostate cancer     SURGICAL HISTORY: Past Surgical History:  Procedure Laterality Date   BOWEL RESECTION N/A 11/29/2020   Procedure: PARTIAL BOWEL RESECTION;  Surgeon: Jovita Kussmaul, MD;  Location: [facility];  Service: General;  Laterality: N/A;   GASTROSTOMY Left 11/29/2020   Procedure: INSERTION OF GASTROSTOMY TUBE;  Surgeon: Jovita Kussmaul, MD;  Location: [facility];  Service: General;  Laterality: Left;   IR IMAGING GUIDED PORT INSERTION  12/12/2020   LAPAROSCOPIC APPENDECTOMY N/A 01/17/2019   Procedure: APPENDECTOMY LAPAROSCOPIC;  Surgeon: Ralene Ok, MD;  Location: [facility];  Service: General;  Laterality: N/A;   LAPAROTOMY N/A 11/29/2020   Procedure: EXPLORATORY LAPAROTOMY;  Surgeon: Jovita Kussmaul, MD;   Location: [facility];  Service: General;  Laterality: N/A;  PUT CASE IN ROOM 1 STARTING AT 9:30AM FOR 120 MIN   OSTOMY N/A 11/29/2020   Procedure: POSSIBLE OSTOMY CREATION;  Surgeon: Jovita Kussmaul, MD;  Location: [facility];  Service: General;  Laterality: N/A;   RECTAL BIOPSY N/A 11/29/2020   Procedure: PERITIONEAL BIOPSY;  Surgeon: Jovita Kussmaul, MD;  Location: [facility];  Service: General;  Laterality: N/A;   SHOULDER SURGERY Left 09/19/2015    I have reviewed the social history and family history with the patient and they are unchanged from previous note.  ALLERGIES:  is allergic to oxaliplatin.  MEDICATIONS:  Current Outpatient Medications  Medication Sig Dispense Refill   acetaminophen (TYLENOL) 500 MG tablet Take 1,000 mg by mouth every 6 (six) hours as needed for mild pain.     ALPRAZolam (XANAX) 0.25 MG tablet Take 1-2 tablets (0.25-0.5 mg total) by mouth at bedtime as needed for anxiety or sleep. 20 tablet 0   dicyclomine (BENTYL) 10 MG capsule TAKE 1 CAPSULE (10 MG TOTAL) BY MOUTH 4 (FOUR) TIMES DAILY - BEFORE MEALS AND AT BEDTIME. 120 capsule 1   diphenoxylate-atropine (LOMOTIL) 2.5-0.025 MG tablet Take 2 tablets by mouth 4 (four) times daily as needed for diarrhea or loose stools. 60 tablet 1   hydrocortisone (ANUSOL-HC) 2.5 % rectal cream Place 1 application  rectally 2 (two) times daily. 30 g 1   ondansetron (ZOFRAN) 8 MG tablet TAKE 1 TABLET BY MOUTH 2TIMES DAILY AS NEEDED FOR NAUSEA/VOMITING. START ON DAY3 AFTER CHEMOTHERAPY.     oxyCODONE (OXY IR/ROXICODONE) 5 MG immediate release tablet Take 1 tablet (5 mg total) by mouth every 8 (eight) hours as needed for severe pain. 30 tablet 0   prochlorperazine (COMPAZINE) 10 MG tablet Take 1 tablet (10 mg total) by mouth every 6 (six) hours as needed for nausea or vomiting. 30 tablet 2   zolpidem (AMBIEN) 5 MG tablet Take 1 tablet (5 mg total) by mouth at bedtime as needed for sleep. 30 tablet 0   Current Facility-Administered Medications   Medication Dose Route Frequency Provider Last Rate Last Admin   0.9 %  sodium chloride infusion   Intravenous PRN Causey, Charlestine Massed, NP        PHYSICAL EXAMINATION: ECOG PERFORMANCE STATUS: 1 - Symptomatic but completely ambulatory  Vitals:   07/31/21 0912  BP: 114/85  Pulse: 93  Resp: 18  Temp: 98.5 F (36.9 C)  SpO2: 98%   Filed Weights   07/31/21 0912  Weight: 261 lb 4.8 oz (118.5 kg)    GENERAL:alert, no distress and comfortable SKIN: no rash EYES: sclera clear LUNGS: normal breathing effort HEART: no lower extremity edema NEURO: alert & oriented x 3 with fluent speech, no focal motor deficits PAC without erythema  LABORATORY DATA:  I have reviewed the data as listed CBC Latest Ref Rng & Units 07/31/2021 07/17/2021 07/03/2021  WBC 4.0 - 10.5 K/uL 15.9(H) 13.8(H) 7.7  Hemoglobin 13.0 - 17.0 g/dL 14.5 14.4 14.0  Hematocrit 39.0 - 52.0 % 43.0 42.2 40.3  Platelets 150 - 400 K/uL 254 196 269     CMP Latest Ref Rng & Units 07/31/2021 07/17/2021 07/03/2021  Glucose 70 - 99 mg/dL 112(H) 98 84  BUN 6 - 20 mg/dL '18 13 17  ' Creatinine 0.61 - 1.24 mg/dL 1.41(H) 1.11 1.18  Sodium 135 - 145 mmol/L 137 137 139  Potassium 3.5 - 5.1 mmol/L 4.2 4.1 4.5  Chloride 98 - 111 mmol/L 106 104 106  CO2 22 - 32 mmol/L '22 24 22  ' Calcium 8.9 - 10.3 mg/dL 8.9 9.1 9.5  Total Protein 6.5 - 8.1 g/dL 7.6 7.6 7.6  Total Bilirubin 0.3 - 1.2 mg/dL 0.4 0.5 0.4  Alkaline Phos 38 - 126 U/L 154(H) 132(H) 114  AST 15 - 41 U/L 13(L) 14(L) 20  ALT 0 - 44 U/L 16 22 34      RADIOGRAPHIC STUDIES: I have personally reviewed the radiological images as listed and agreed with the findings in the report. No results found.   ASSESSMENT & PLAN: Ricardo [name] is a 55 y.o. male with no significant medical history.      1. 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 Work up showed mass in the terminal ileum with extensive omental nodularity and LN involvement. His baseline tumor marker CA 19-9 and CEA were normal. 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   This confirms stage IV metastatic cancer from cecum -He began first-line  palliative chemo with FOLFOX on 12/18/2020, tolerated very well -FO shows KRAS/NRAS wildtype, but given his right side colon cancer, we do not use EGFR inhibitor and instead would recommend bevacizumab.  Bevacizumab was added with cycle 2 -CTAP without contrast from 02/24/2021 showed interval resolution of the previously seen small volume ascites, and interval improvement in the right lower quadrant mesocolon lymph nodes, consistent with treatment response of nodal and peritoneal metastatic disease. -Treatment was escalated to FOLFOXIRI with cycle 6 FOLFOX on 03/13/2021,  He tolerated well -He underwent laparoscopy on 05/13/2021 by Dr. Clovis Riley, unfortunately due to his extensive peritoneal metastasis he was not a candidate for cytoreductive surgery/HIPEC. -He continues to tolerate FOLFIRI and bevacizumab  -restaging CT 07/01/21 showed stable disease    2. Symptom Management: Constipation, lower abdominal pain, abdominal bloating secondary #1 and partial bowl obstruction  -For 8-9 weeks before hospitalization he was having increasing lower abdominal pain and constipation. He did have G-tube placed with surgery on 11/29/20. He notes/drains with bag as needed. -Since starting chemo he has been able to come off most supportive meds including oxycodone, nausea meds, and Lomotil. -Dicyclomine helps cramping -IVF on days 1 and 3 with pump d/c and inj have  helped   3. Genetic testing -He has brother with prostate cancer and sister with breast cancer in their 16s. He is eligible for genetic testing.  -Negative result   4. Social Support -He is an avid cyclist. Last long distant riding was 06/2020 across [place]. He has not biked in the 8-9 weeks before cancer diagnosis due to symptoms. His weight heavily fluctuates with changes in his activity level, diet and biking. -He is married with 1 adult son. His brother Jenny Reichmann is in town. He works as a Designer, television/film set at [employer]. He may take time off work based on how to tolerate chemotherapy. -I offered cone resources including chaplin and SW if needed. He notes he has good family support, very close with his wife and 50 year old son who currently lives in [place]    5.  Goals of care discussion, full code -Due to his peritoneal metastasis, he is not a candidate for cytoreductive surgery with HIPEC -He understands his cancer is metastatic, not curable, but still treatable -He tolerates chemo with good performance status -In the end, he does not want to suffer.  He strongly desires to have a clear head to say goodbye to his wife and son when the time comes.   -07/31/21 he voiced that one of his goals is to bike 800 miles from here to [facility]. He was recently cleared to start biking again and he is happy about that. -Ongoing emotional support was provided.  We will continue goals of care discussions.  Disposition: Mr. Hylton appears stable. He completed 9 cycle of FOLFIRI/Beva, tolerates treatment well overall with high PS. Side effects are adequately managed with supportive care at home and IVF on days 1 and 3 each cycle. He is able to recover and function well after 6 days. No clinical evidence of disease progression.   We reviewed symptom management, nutrition/hydration, mental health, faith, and his goals.   Labs reviewed, adequate to proceed with cycle 10 FOLFIRI and Beva at current dose, IVF days 1 and  3 with pump d/c and gcsf. Ok to omit urine protein today.  F/up with next cycles in 2, 4, and 7 weeks.   All questions were answered. The patient knows to call the clinic with any problems, questions or concerns. No  barriers to learning were detected.     Alla Feeling, NP 07/31/21

## 2021-07-31 ENCOUNTER — Inpatient Hospital Stay: Payer: BC Managed Care – PPO

## 2021-07-31 ENCOUNTER — Encounter: Payer: Self-pay | Admitting: Nurse Practitioner

## 2021-07-31 ENCOUNTER — Inpatient Hospital Stay (HOSPITAL_BASED_OUTPATIENT_CLINIC_OR_DEPARTMENT_OTHER): Payer: BC Managed Care – PPO | Admitting: Nurse Practitioner

## 2021-07-31 ENCOUNTER — Other Ambulatory Visit: Payer: Self-pay

## 2021-07-31 VITALS — BP 114/85 | HR 93 | Temp 98.5°F | Resp 18 | Ht 71.0 in | Wt 261.3 lb

## 2021-07-31 DIAGNOSIS — C182 Malignant neoplasm of ascending colon: Secondary | ICD-10-CM

## 2021-07-31 DIAGNOSIS — C772 Secondary and unspecified malignant neoplasm of intra-abdominal lymph nodes: Secondary | ICD-10-CM

## 2021-07-31 DIAGNOSIS — Z95828 Presence of other vascular implants and grafts: Secondary | ICD-10-CM

## 2021-07-31 LAB — CBC WITH DIFFERENTIAL (CANCER CENTER ONLY)
Abs Immature Granulocytes: 0.07 10*3/uL (ref 0.00–0.07)
Basophils Absolute: 0 10*3/uL (ref 0.0–0.1)
Basophils Relative: 0 %
Eosinophils Absolute: 0.1 10*3/uL (ref 0.0–0.5)
Eosinophils Relative: 1 %
HCT: 43 % (ref 39.0–52.0)
Hemoglobin: 14.5 g/dL (ref 13.0–17.0)
Immature Granulocytes: 0 %
Lymphocytes Relative: 7 %
Lymphs Abs: 1.2 10*3/uL (ref 0.7–4.0)
MCH: 30.5 pg (ref 26.0–34.0)
MCHC: 33.7 g/dL (ref 30.0–36.0)
MCV: 90.3 fL (ref 80.0–100.0)
Monocytes Absolute: 0.8 10*3/uL (ref 0.1–1.0)
Monocytes Relative: 5 %
Neutro Abs: 13.7 10*3/uL — ABNORMAL HIGH (ref 1.7–7.7)
Neutrophils Relative %: 87 %
Platelet Count: 254 10*3/uL (ref 150–400)
RBC: 4.76 MIL/uL (ref 4.22–5.81)
RDW: 14.8 % (ref 11.5–15.5)
WBC Count: 15.9 10*3/uL — ABNORMAL HIGH (ref 4.0–10.5)
nRBC: 0 % (ref 0.0–0.2)

## 2021-07-31 LAB — CMP (CANCER CENTER ONLY)
ALT: 16 U/L (ref 0–44)
AST: 13 U/L — ABNORMAL LOW (ref 15–41)
Albumin: 3.7 g/dL (ref 3.5–5.0)
Alkaline Phosphatase: 154 U/L — ABNORMAL HIGH (ref 38–126)
Anion gap: 9 (ref 5–15)
BUN: 18 mg/dL (ref 6–20)
CO2: 22 mmol/L (ref 22–32)
Calcium: 8.9 mg/dL (ref 8.9–10.3)
Chloride: 106 mmol/L (ref 98–111)
Creatinine: 1.41 mg/dL — ABNORMAL HIGH (ref 0.61–1.24)
GFR, Estimated: 58 mL/min — ABNORMAL LOW (ref >60)
Glucose, Bld: 112 mg/dL — ABNORMAL HIGH (ref 70–99)
Potassium: 4.2 mmol/L (ref 3.5–5.1)
Sodium: 137 mmol/L (ref 135–145)
Total Bilirubin: 0.4 mg/dL (ref 0.3–1.2)
Total Protein: 7.6 g/dL (ref 6.5–8.1)

## 2021-07-31 MED ORDER — PALONOSETRON HCL INJECTION 0.25 MG/5ML
0.2500 mg | Freq: Once | INTRAVENOUS | Status: AC
  Administered 2021-07-31: 11:00:00 0.25 mg via INTRAVENOUS
  Filled 2021-07-31: qty 5

## 2021-07-31 MED ORDER — SODIUM CHLORIDE 0.9% FLUSH
10.0000 mL | Freq: Once | INTRAVENOUS | Status: AC
  Administered 2021-07-31: 09:00:00 10 mL

## 2021-07-31 MED ORDER — SODIUM CHLORIDE 0.9 % IV SOLN
400.0000 mg/m2 | Freq: Once | INTRAVENOUS | Status: AC
  Administered 2021-07-31: 13:00:00 972 mg via INTRAVENOUS
  Filled 2021-07-31: qty 48.6

## 2021-07-31 MED ORDER — SODIUM CHLORIDE 0.9 % IV SOLN
150.0000 mg | Freq: Once | INTRAVENOUS | Status: AC
  Administered 2021-07-31: 11:00:00 150 mg via INTRAVENOUS
  Filled 2021-07-31: qty 150

## 2021-07-31 MED ORDER — SODIUM CHLORIDE 0.9 % IV SOLN
180.0000 mg/m2 | Freq: Once | INTRAVENOUS | Status: AC
  Administered 2021-07-31: 13:00:00 440 mg via INTRAVENOUS
  Filled 2021-07-31: qty 22

## 2021-07-31 MED ORDER — SODIUM CHLORIDE 0.9 % IV SOLN
2400.0000 mg/m2 | INTRAVENOUS | Status: DC
  Administered 2021-07-31: 14:00:00 5850 mg via INTRAVENOUS
  Filled 2021-07-31: qty 117

## 2021-07-31 MED ORDER — SODIUM CHLORIDE 0.9 % IV SOLN
10.0000 mg | Freq: Once | INTRAVENOUS | Status: AC
  Administered 2021-07-31: 11:00:00 10 mg via INTRAVENOUS
  Filled 2021-07-31: qty 10

## 2021-07-31 MED ORDER — SODIUM CHLORIDE 0.9 % IV SOLN
5.0000 mg/kg | Freq: Once | INTRAVENOUS | Status: AC
  Administered 2021-07-31: 12:00:00 600 mg via INTRAVENOUS
  Filled 2021-07-31: qty 16

## 2021-07-31 MED ORDER — SODIUM CHLORIDE 0.9 % IV SOLN: Freq: Once | INTRAVENOUS | Status: AC

## 2021-07-31 MED ORDER — SODIUM CHLORIDE 0.9 % IV SOLN: INTRAVENOUS | Status: DC

## 2021-07-31 MED ORDER — ATROPINE SULFATE 1 MG/ML IV SOLN
0.5000 mg | Freq: Once | INTRAVENOUS | Status: DC | PRN
  Filled 2021-07-31: qty 1

## 2021-07-31 NOTE — Patient Instructions (Signed)
[facility]  Discharge Instructions: Thank you for choosing [facility] to provide your oncology and hematology care.   If you have a lab appointment with the [facility], please go directly to the [facility] and check in at the registration area.   Wear comfortable clothing and clothing appropriate for easy access to any Portacath or PICC line.   We strive to give you quality time with your provider. You may need to reschedule your appointment if you arrive late (15 or more minutes).  Arriving late affects you and other patients whose appointments are after yours.  Also, if you miss three or more appointments without notifying the office, you may be dismissed from the clinic at the provider's discretion.      For prescription refill requests, have your pharmacy contact our office and allow 72 hours for refills to be completed.    Today you received the following chemotherapy and/or immunotherapy agents irinotecan, fluorourcil, bevacizumab, leucovorin      To help prevent nausea and vomiting after your treatment, we encourage you to take your nausea medication as directed.  BELOW ARE SYMPTOMS THAT SHOULD BE REPORTED IMMEDIATELY: *FEVER GREATER THAN 100.4 F (38 C) OR HIGHER *CHILLS OR SWEATING *NAUSEA AND VOMITING THAT IS NOT CONTROLLED WITH YOUR NAUSEA MEDICATION *UNUSUAL SHORTNESS OF BREATH *UNUSUAL BRUISING OR BLEEDING *URINARY PROBLEMS (pain or burning when urinating, or frequent urination) *BOWEL PROBLEMS (unusual diarrhea, constipation, pain near the anus) TENDERNESS IN MOUTH AND THROAT WITH OR WITHOUT PRESENCE OF ULCERS (sore throat, sores in mouth, or a toothache) UNUSUAL RASH, SWELLING OR PAIN  UNUSUAL VAGINAL DISCHARGE OR ITCHING   Items with * indicate a potential emergency and should be followed up as soon as possible or go to the Emergency Department if any problems should occur.  Please show the CHEMOTHERAPY ALERT CARD or  IMMUNOTHERAPY ALERT CARD at check-in to the Emergency Department and triage nurse.  Should you have questions after your visit or need to cancel or reschedule your appointment, please contact [facility]  Dept: [phone]  and follow the prompts.  Office hours are 8:00 a.m. to 4:30 p.m. Monday - Friday. Please note that voicemails left after 4:00 p.m. may not be returned until the following business day.  We are closed weekends and major holidays. You have access to a nurse at all times for urgent questions. Please call the main number to the clinic Dept: [phone] and follow the prompts.   For any non-urgent questions, you may also contact your provider using MyChart. We now offer e-Visits for anyone 29 and older to request care online for non-urgent symptoms. For details visit [email or web address].   Also download the MyChart app! Go to the app store, search "MyChart", open the app, select [facility], and log in with your MyChart username and password.  Due to Covid, a mask is required upon entering the hospital/clinic. If you do not have a mask, one will be given to you upon arrival. For doctor visits, patients may have 1 support person aged 33 or older with them. For treatment visits, patients cannot have anyone with them due to current Covid guidelines and our immunocompromised population.

## 2021-08-02 ENCOUNTER — Inpatient Hospital Stay: Payer: BC Managed Care – PPO

## 2021-08-02 ENCOUNTER — Other Ambulatory Visit: Payer: Self-pay

## 2021-08-02 VITALS — BP 126/80 | HR 80 | Temp 98.4°F | Resp 18

## 2021-08-02 DIAGNOSIS — C182 Malignant neoplasm of ascending colon: Secondary | ICD-10-CM

## 2021-08-02 DIAGNOSIS — C772 Secondary and unspecified malignant neoplasm of intra-abdominal lymph nodes: Secondary | ICD-10-CM

## 2021-08-02 MED ORDER — PEGFILGRASTIM-CBQV 6 MG/0.6ML ~~LOC~~ SOSY
6.0000 mg | PREFILLED_SYRINGE | Freq: Once | SUBCUTANEOUS | Status: AC
  Administered 2021-08-02: 6 mg via SUBCUTANEOUS
  Filled 2021-08-02: qty 0.6

## 2021-08-02 MED ORDER — HEPARIN SOD (PORK) LOCK FLUSH 100 UNIT/ML IV SOLN
500.0000 [IU] | Freq: Once | INTRAVENOUS | Status: AC | PRN
  Administered 2021-08-02: 500 [IU]

## 2021-08-02 MED ORDER — SODIUM CHLORIDE 0.9% FLUSH
10.0000 mL | INTRAVENOUS | Status: DC | PRN
  Administered 2021-08-02: 10 mL

## 2021-08-02 MED ORDER — SODIUM CHLORIDE 0.9 % IV SOLN: INTRAVENOUS | Status: DC

## 2021-08-02 NOTE — Patient Instructions (Signed)
Pegfilgrastim Injection What is this medication? PEGFILGRASTIM (PEG fil gra stim) lowers the risk of infection in people who are receiving chemotherapy. It works by Building control surveyor make more white blood cells, which protects your body from infection. It may also be used to help people who have been exposed to high doses of radiation. This medicine may be used for other purposes; ask your health care provider or pharmacist if you have questions. COMMON BRAND NAME(S): Rexene Edison, Ziextenzo What should I tell my care team before I take this medication? They need to know if you have any of these conditions: Kidney disease Latex allergy Ongoing radiation therapy Sickle cell disease Skin reactions to acrylic adhesives (On-Body Injector only) An unusual or allergic reaction to pegfilgrastim, filgrastim, other medications, foods, dyes, or preservatives Pregnant or trying to get pregnant Breast-feeding How should I use this medication? This medication is for injection under the skin. If you get this medication at home, you will be taught how to prepare and give the pre-filled syringe or how to use the On-body Injector. Refer to the patient Instructions for Use for detailed instructions. Use exactly as directed. Tell your care team immediately if you suspect that the On-body Injector may not have performed as intended or if you suspect the use of the On-body Injector resulted in a missed or partial dose. It is important that you put your used needles and syringes in a special sharps container. Do not put them in a trash can. If you do not have a sharps container, call your pharmacist or care team to get one. Talk to your care team about the use of this medication in children. While this medication may be prescribed for selected conditions, precautions do apply. Overdosage: If you think you have taken too much of this medicine contact a poison control center or emergency room at  once. NOTE: This medicine is only for you. Do not share this medicine with others. What if I miss a dose? It is important not to miss your dose. Call your care team if you miss your dose. If you miss a dose due to an On-body Injector failure or leakage, a new dose should be administered as soon as possible using a single prefilled syringe for manual use. What may interact with this medication? Interactions have not been studied. This list may not describe all possible interactions. Give your health care provider a list of all the medicines, herbs, non-prescription drugs, or dietary supplements you use. Also tell them if you smoke, drink alcohol, or use illegal drugs. Some items may interact with your medicine. What should I watch for while using this medication? Your condition will be monitored carefully while you are receiving this medication. You may need blood work done while you are taking this medication. Talk to your care team about your risk of cancer. You may be more at risk for certain types of cancer if you take this medication. If you are going to need a MRI, CT scan, or other procedure, tell your care team that you are using this medication (On-Body Injector only). What side effects may I notice from receiving this medication? Side effects that you should report to your care team as soon as possible: Allergic reactions--skin rash, itching, hives, swelling of the face, lips, tongue, or throat Capillary leak syndrome--stomach or muscle pain, unusual weakness or fatigue, feeling faint or lightheaded, decrease in the amount of urine, swelling of the ankles, hands, or feet, trouble breathing High  white blood cell level--fever, fatigue, trouble breathing, night sweats, change in vision, weight loss Inflammation of the aorta--fever, fatigue, back, chest, or stomach pain, severe headache Kidney injury (glomerulonephritis)--decrease in the amount of urine, red or dark brown urine, foamy or  bubbly urine, swelling of the ankles, hands, or feet Shortness of breath or trouble breathing Spleen injury--pain in upper left stomach or shoulder Unusual bruising or bleeding Side effects that usually do not require medical attention (report to your care team if they continue or are bothersome): Bone pain Pain in the hands or feet This list may not describe all possible side effects. Call your doctor for medical advice about side effects. You may report side effects to FDA at 1-800-FDA-1088. Where should I keep my medication? Keep out of the reach of children. If you are using this medication at home, you will be instructed on how to store it. Throw away any unused medication after the expiration date on the label. NOTE: This sheet is a summary. It may not cover all possible information. If you have questions about this medicine, talk to your doctor, pharmacist, or health care provider.  2022 Elsevier/Gold Standard (2021-05-20 00:00:00) Rehydration, Adult Rehydration is the replacement of body fluids, salts, and minerals (electrolytes) that are lost during dehydration. Dehydration is when there is not enough water or other fluids in the body. This happens when you lose more fluids than you take in. Common causes of dehydration include: Not drinking enough fluids. This can occur when you are ill or doing activities that require a lot of energy, especially in hot weather. Conditions that cause loss of water or other fluids, such as diarrhea, vomiting, sweating, or urinating a lot. Other illnesses, such as fever or infection. Certain medicines, such as those that remove excess fluid from the body (diuretics). Symptoms of mild or moderate dehydration may include thirst, dry lips and mouth, and dizziness. Symptoms of severe dehydration may include increased heart rate, confusion, fainting, and not urinating. For severe dehydration, you may need to get fluids through an IV at the hospital. For mild  or moderate dehydration, you can usually rehydrate at home by drinking certain fluids as told by your health care provider. What are the risks? Generally, rehydration is safe. However, taking in too much fluid (overhydration) can be a problem. This is rare. Overhydration can cause an electrolyte imbalance, kidney failure, or a decrease in salt (sodium) levels in the body. Supplies needed You will need an oral rehydration solution (ORS) if your health care provider tells you to use one. This is a drink to treat dehydration. It can be found in pharmacies and retail stores. How to rehydrate Fluids Follow instructions from your health care provider for rehydration. The kind of fluid and the amount you should drink depend on your condition. In general, you should choose drinks that you prefer. If told by your health care provider, drink an ORS. Make an ORS by following instructions on the package. Start by drinking small amounts, about  cup (120 mL) every 5-10 minutes. Slowly increase how much you drink until you have taken the amount recommended by your health care provider. Drink enough clear fluids to keep your urine pale yellow. If you were told to drink an ORS, finish it first, then start slowly drinking other clear fluids. Drink fluids such as: Water. This includes sparkling water and flavored water. Drinking only water can lead to having too little sodium in your body (hyponatremia). Follow the advice of your health  care provider. Water from ice chips you suck on. Fruit juice with water you add to it (diluted). Sports drinks. Hot or cold herbal teas. Broth-based soups. Milk or milk products. Food Follow instructions from your health care provider about what to eat while you rehydrate. Your health care provider may recommend that you slowly begin eating regular foods in small amounts. Eat foods that contain a healthy balance of electrolytes, such as bananas, oranges, potatoes, tomatoes, and  spinach. Avoid foods that are greasy or contain a lot of sugar. In some cases, you may get nutrition through a feeding tube that is passed through your nose and into your stomach (nasogastric tube, or NG tube). This may be done if you have uncontrolled vomiting or diarrhea. Beverages to avoid Certain beverages may make dehydration worse. While you rehydrate, avoid drinking alcohol. How to tell if you are recovering from dehydration You may be recovering from dehydration if: You are urinating more often than before you started rehydrating. Your urine is pale yellow. Your energy level improves. You vomit less frequently. You have diarrhea less frequently. Your appetite improves or returns to normal. You feel less dizzy or less light-headed. Your skin tone and color start to look more normal. Follow these instructions at home: Take over-the-counter and prescription medicines only as told by your health care provider. Do not take sodium tablets. Doing this can lead to having too much sodium in your body (hypernatremia). Contact a health care provider if: You continue to have symptoms of mild or moderate dehydration, such as: Thirst. Dry lips. Slightly dry mouth. Dizziness. Dark urine or less urine than normal. Muscle cramps. You continue to vomit or have diarrhea. Get help right away if you: Have symptoms of dehydration that get worse. Have a fever. Have a severe headache. Have been vomiting and the following happens: Your vomiting gets worse or does not go away. Your vomit includes blood or green matter (bile). You cannot eat or drink without vomiting. Have problems with urination or bowel movements, such as: Diarrhea that gets worse or does not go away. Blood in your stool (feces). This may cause stool to look black and tarry. Not urinating, or urinating only a small amount of very dark urine, within 6-8 hours. Have trouble breathing. Have symptoms that get worse with  treatment. These symptoms may represent a serious problem that is an emergency. Do not wait to see if the symptoms will go away. Get medical help right away. Call your local emergency services (911 in the [place] Do not drive yourself to the hospital. Summary Rehydration is the replacement of body fluids and minerals (electrolytes) that are lost during dehydration. Follow instructions from your health care provider for rehydration. The kind of fluid and amount you should drink depend on your condition. Slowly increase how much you drink until you have taken the amount recommended by your health care provider. Contact your health care provider if you continue to show signs of mild or moderate dehydration. This information is not intended to replace advice given to you by your health care provider. Make sure you discuss any questions you have with your health care provider. Document Revised: 11/01/2019 Document Reviewed: 09/11/2019 Elsevier Patient Education  2022 [employer].

## 2021-08-05 ENCOUNTER — Telehealth: Payer: Self-pay | Admitting: Hematology

## 2021-08-05 NOTE — Telephone Encounter (Signed)
Left message with rescheduled upcoming appointments per 11/21 email from provider.

## 2021-08-13 MED FILL — Fosaprepitant Dimeglumine For IV Infusion 150 MG (Base Eq): INTRAVENOUS | Qty: 5 | Status: AC

## 2021-08-13 MED FILL — Dexamethasone Sodium Phosphate Inj 100 MG/10ML: INTRAMUSCULAR | Qty: 1 | Status: AC

## 2021-08-14 ENCOUNTER — Other Ambulatory Visit: Payer: Self-pay

## 2021-08-14 ENCOUNTER — Inpatient Hospital Stay: Payer: BC Managed Care – PPO

## 2021-08-14 ENCOUNTER — Encounter: Payer: Self-pay | Admitting: Hematology

## 2021-08-14 ENCOUNTER — Inpatient Hospital Stay (HOSPITAL_BASED_OUTPATIENT_CLINIC_OR_DEPARTMENT_OTHER): Payer: BC Managed Care – PPO | Admitting: Hematology

## 2021-08-14 ENCOUNTER — Inpatient Hospital Stay: Payer: BC Managed Care – PPO | Attending: Nurse Practitioner

## 2021-08-14 VITALS — BP 115/93 | HR 92 | Temp 97.7°F | Resp 17 | Wt 259.5 lb

## 2021-08-14 DIAGNOSIS — C786 Secondary malignant neoplasm of retroperitoneum and peritoneum: Secondary | ICD-10-CM

## 2021-08-14 DIAGNOSIS — C772 Secondary and unspecified malignant neoplasm of intra-abdominal lymph nodes: Secondary | ICD-10-CM | POA: Insufficient documentation

## 2021-08-14 DIAGNOSIS — Z95828 Presence of other vascular implants and grafts: Secondary | ICD-10-CM

## 2021-08-14 DIAGNOSIS — K5900 Constipation, unspecified: Secondary | ICD-10-CM | POA: Diagnosis not present

## 2021-08-14 DIAGNOSIS — Z79899 Other long term (current) drug therapy: Secondary | ICD-10-CM | POA: Diagnosis not present

## 2021-08-14 DIAGNOSIS — C182 Malignant neoplasm of ascending colon: Secondary | ICD-10-CM

## 2021-08-14 DIAGNOSIS — Z5111 Encounter for antineoplastic chemotherapy: Secondary | ICD-10-CM | POA: Diagnosis present

## 2021-08-14 LAB — CBC WITH DIFFERENTIAL (CANCER CENTER ONLY)
Abs Immature Granulocytes: 0.06 10*3/uL (ref 0.00–0.07)
Basophils Absolute: 0 10*3/uL (ref 0.0–0.1)
Basophils Relative: 0 %
Eosinophils Absolute: 0.1 10*3/uL (ref 0.0–0.5)
Eosinophils Relative: 1 %
HCT: 42.4 % (ref 39.0–52.0)
Hemoglobin: 14.5 g/dL (ref 13.0–17.0)
Immature Granulocytes: 0 %
Lymphocytes Relative: 7 %
Lymphs Abs: 1.2 10*3/uL (ref 0.7–4.0)
MCH: 30.8 pg (ref 26.0–34.0)
MCHC: 34.2 g/dL (ref 30.0–36.0)
MCV: 90 fL (ref 80.0–100.0)
Monocytes Absolute: 1 10*3/uL (ref 0.1–1.0)
Monocytes Relative: 6 %
Neutro Abs: 14.5 10*3/uL — ABNORMAL HIGH (ref 1.7–7.7)
Neutrophils Relative %: 86 %
Platelet Count: 254 10*3/uL (ref 150–400)
RBC: 4.71 MIL/uL (ref 4.22–5.81)
RDW: 15.4 % (ref 11.5–15.5)
WBC Count: 16.8 10*3/uL — ABNORMAL HIGH (ref 4.0–10.5)
nRBC: 0 % (ref 0.0–0.2)

## 2021-08-14 LAB — CMP (CANCER CENTER ONLY)
ALT: 21 U/L (ref 0–44)
AST: 14 U/L — ABNORMAL LOW (ref 15–41)
Albumin: 3.8 g/dL (ref 3.5–5.0)
Alkaline Phosphatase: 173 U/L — ABNORMAL HIGH (ref 38–126)
Anion gap: 9 (ref 5–15)
BUN: 17 mg/dL (ref 6–20)
CO2: 22 mmol/L (ref 22–32)
Calcium: 9.1 mg/dL (ref 8.9–10.3)
Chloride: 107 mmol/L (ref 98–111)
Creatinine: 1.21 mg/dL (ref 0.61–1.24)
GFR, Estimated: >60 mL/min (ref >60)
Glucose, Bld: 91 mg/dL (ref 70–99)
Potassium: 4.2 mmol/L (ref 3.5–5.1)
Sodium: 138 mmol/L (ref 135–145)
Total Bilirubin: 0.2 mg/dL — ABNORMAL LOW (ref 0.3–1.2)
Total Protein: 7.8 g/dL (ref 6.5–8.1)

## 2021-08-14 LAB — TOTAL PROTEIN, URINE DIPSTICK

## 2021-08-14 MED ORDER — SODIUM CHLORIDE 0.9 % IV SOLN
5.0000 mg/kg | Freq: Once | INTRAVENOUS | Status: AC
  Administered 2021-08-14: 600 mg via INTRAVENOUS
  Filled 2021-08-14: qty 8

## 2021-08-14 MED ORDER — SODIUM CHLORIDE 0.9% FLUSH
10.0000 mL | Freq: Once | INTRAVENOUS | Status: AC
  Administered 2021-08-14: 10 mL

## 2021-08-14 MED ORDER — SODIUM CHLORIDE 0.9 % IV SOLN
150.0000 mg | Freq: Once | INTRAVENOUS | Status: AC
  Administered 2021-08-14: 150 mg via INTRAVENOUS
  Filled 2021-08-14: qty 150

## 2021-08-14 MED ORDER — DEXAMETHASONE SODIUM PHOSPHATE 100 MG/10ML IJ SOLN
10.0000 mg | Freq: Once | INTRAMUSCULAR | Status: AC
  Administered 2021-08-14: 10 mg via INTRAVENOUS
  Filled 2021-08-14: qty 10

## 2021-08-14 MED ORDER — PALONOSETRON HCL INJECTION 0.25 MG/5ML
0.2500 mg | Freq: Once | INTRAVENOUS | Status: AC
  Administered 2021-08-14: 0.25 mg via INTRAVENOUS
  Filled 2021-08-14: qty 5

## 2021-08-14 MED ORDER — ATROPINE SULFATE 1 MG/ML IV SOLN
0.5000 mg | Freq: Once | INTRAVENOUS | Status: AC | PRN
  Administered 2021-08-14: 0.5 mg via INTRAVENOUS
  Filled 2021-08-14: qty 1

## 2021-08-14 MED ORDER — SODIUM CHLORIDE 0.9 % IV SOLN: INTRAVENOUS | Status: DC

## 2021-08-14 MED ORDER — SODIUM CHLORIDE 0.9 % IV SOLN
2400.0000 mg/m2 | INTRAVENOUS | Status: DC
  Administered 2021-08-14: 5850 mg via INTRAVENOUS
  Filled 2021-08-14: qty 117

## 2021-08-14 MED ORDER — SODIUM CHLORIDE 0.9 % IV SOLN
400.0000 mg/m2 | Freq: Once | INTRAVENOUS | Status: AC
  Administered 2021-08-14: 972 mg via INTRAVENOUS
  Filled 2021-08-14: qty 48.6

## 2021-08-14 MED ORDER — SODIUM CHLORIDE 0.9 % IV SOLN
150.0000 mg/m2 | Freq: Once | INTRAVENOUS | Status: AC
  Administered 2021-08-14: 360 mg via INTRAVENOUS
  Filled 2021-08-14: qty 15

## 2021-08-14 NOTE — Progress Notes (Signed)
Ricardo [name]   [phone] Fax:(336) [phone]   Clinic Follow up Note   Patient Care Team: [name], MD as PCP - General (Internal Medicine) [name], MD as Consulting Physician (Oncology) [name], RN (Inactive) as Oncology Nurse Navigator  Date of Service:  08/14/2021  CHIEF COMPLAINT: f/u of metastatic colon cancer  CURRENT THERAPY:  First line FOLFOX q2 weeks starting 12/18/20. Bevacizumab added with cycle 2. Added Irinotecan from C6 on 03/13/21. Oxaliplatin discontinued after C7 due to allergy reactions. 5FU decreased to 2400 mg on 07/17/21 C9  ASSESSMENT & PLAN:  Ricardo [name] is a 55 y.o. male with   1. 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 Work up showed mass in the terminal ileum with extensive omental nodularity and LN involvement. His baseline tumor marker CA 19-9 and CEA were normal. 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   This confirms stage IV metastatic cancer from cecum -He began first-line palliative chemo with FOLFOX on 12/18/2020, tolerated very well -FO shows KRAS/NRAS wildtype, but given his right side colon cancer, we do not use EGFR inhibitor and instead would recommend bevacizumab.  Bevacizumab was added with cycle 2 -CTAP without contrast from 02/24/2021 showed interval resolution of the previously seen small volume ascites, and interval improvement in the right lower quadrant mesocolon lymph nodes, consistent with treatment response of nodal and peritoneal metastatic disease. -Treatment  was escalated to [facility] with cycle 6 FOLFOX on 03/13/21,  He tolerated well -He underwent laparoscopy on 05/13/2021 by Dr. Clovis Riley, unfortunately due to his extensive peritoneal metastasis he was not a candidate for cytoreductive surgery/HIPEC. -He continues to tolerate FOLFIRI and bevacizumab but does have more fatigue lately  -restaging CT 07/01/21 showed stable disease  -due to fatigue, I will reduce his irinotecan dose to 150 mg/m   2. Symptom Management: Constipation, lower abdominal pain, abdominal bloating secondary #1 and partial bowl obstruction  -For 8-9 weeks before hospitalization he was having increasing lower abdominal pain and constipation. He did have G-tube placed with surgery on 11/29/20. He notes/drains with bag as needed. -Since starting chemo he has been able to come off most supportive meds including oxycodone, nausea meds, and Lomotil. -Dicyclomine helps cramping -IVF on days 1 and 3 with pump d/c and inj have helped   3. Genetic testing -He has brother with prostate cancer and sister with breast cancer in their 108s. He is eligible for genetic testing.  -Negative result   4. Social Support -He is an avid cyclist. Last long distant riding was 06/2020 across [place]. He has not biked in the 8-9 weeks before cancer diagnosis due to symptoms. His weight heavily fluctuates with changes in his activity level, diet and biking. -He is married with 1 adult son. His brother Jenny Reichmann is in town. He works as a Designer, television/film set at [employer]. He may take time off work based on how to tolerate chemotherapy. -He notes he has good family support, very close with his wife and 33 year old son who currently lives in [place]    5.  Goals of care discussion, full code -Due to  his peritoneal metastasis, he is not a candidate for cytoreductive surgery with HIPEC -He understands his cancer is metastatic, not curable, but still treatable -He tolerates chemo with good performance status -In the  end, he does not want to suffer.  He strongly desires to have a clear head to say goodbye to his wife and son when the time comes.   -07/31/21 he voiced that one of his goals is to bike 800 miles from here to [facility]. He was recently cleared to start biking again and he is happy about that. -Ongoing emotional support was provided.  We will continue goals of care discussions.   PLAN: -proceed with C11 FOLFIRI and beva today with dose reduction of irinotecan due to fatigue -lab, flush, f/u, and FOLFIRI/beva on 09/02/21 and 09/18/21 as scheduled   No problem-specific Assessment & Plan notes found for this encounter.   SUMMARY OF ONCOLOGIC HISTORY: Oncology History Overview Note  Cancer Staging metastatic cecal cancer Staging form: Colon and Rectum, AJCC 8th Edition - Clinical stage from 11/29/2020: Stage IVC (cTX, cN2, pM1c) - Signed by [name], MD on 12/04/2020 Stage prefix: Initial diagnosis Histologic grade (G): G3 Histologic grading system: 4 grade system    metastatic cecal cancer  11/27/2020 Imaging   CT Angio CAP  IMPRESSION: 1. Thickening of the terminal ileum with associated proximal mid to distal small bowel obstruction. Findings could be due to an ileitis versus malignancy. Nonspecific mesenteric edema could be due to engorgement versus metastases. No associated bowel perforation. Recommend endoscopy for further evaluation. 2. Indeterminate right lower quadrant lymphadenopathy. 3. Scattered colonic diverticulosis with no acute diverticulitis. 4. Stable right hepatic lobe subcentimeter hyperdensity likely represents a hepatic hemangioma. 5. No acute vascular abnormality. Aortic Atherosclerosis (ICD10-I70.0) - mild. 6. No acute intrathoracic abnormality.   11/28/2020 Imaging   CT AP  IMPRESSION: 1. There is masslike, circumferential thickening of the terminal ileum and cecal base near the ileocecal valve and abnormally enlarged lymph nodes in the right lower quadrant  mesentery adjacent to the terminal ileum measuring up to 2.3 x 1.4 cm. 2. There is extensive omental and peritoneal nodularity and caking throughout the abdomen. 3. Findings are highly concerning for primary colon malignancy with nodal and peritoneal metastatic disease. 4. Small volume perihepatic and perisplenic ascites. 5. The small bowel is generally decompressed, with full some fluid-filled, nondistended loops throughout. There is transit of oral enteric contrast to the terminal ileum. No evidence of overt bowel obstruction at this time. Esophagogastric tube is position with tip and side port below the diaphragm. 6. Atelectasis or consolidation of the dependent bilateral lung bases, new compared to prior examination.   Aortic Atherosclerosis (ICD10-I70.0).     11/29/2020 Surgery   EXPLORATORY LAPAROTOMY, PARTIAL BOWEL RESECTION, POSSIBLE OSTOMY CREATION, PERITIONEAL BIOPSY, INSERTION OF GASTROSTOMY TUBE by Dr [name]   11/29/2020 Initial Biopsy   FINAL MICROSCOPIC DIAGNOSIS:   AB. 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    COMMENT:   Immunohistochemistry (IHC) for CK20 and CDX-2 is strong and diffusely  positive.  CK7, TTF-1, Synaptophysin, Chromogranin and CD56 are  negative.  The immunophenotype is compatible with origin from the lower  gastrointestinal tract.  IHC for MMR will be reported separately.  Case  preliminarily discussed with Dr. [name] on 12/02/2020.   At the request of Dr. [name], [phone] was reviewed in retrospect.  Review of the submitted sections confirms the presence of acute  appendicitis.  No  malignancy is identified.    11/29/2020 Cancer Staging   Staging form: Colon and Rectum, AJCC 8th Edition - Clinical stage from 11/29/2020: Stage IVC (cTX, cN2, pM1c) - Signed by [name], MD on 12/04/2020 Stage prefix: Initial diagnosis Histologic grade (G):  G3 Histologic grading system: 4 grade system    12/04/2020 Initial Diagnosis   Cancer of ascending colon metastatic to intra-abdominal lymph node ([place])    Chemotherapy   FOLFOX q2weeks    12/18/2020 - 02/15/2021 Chemotherapy      Patient is on Antibody Plan: COLORECTAL BEVACIZUMAB Q14D     01/03/2021 Imaging   CT A/P IMPRESSION: 1. Wall thickening of the terminal ileum again seen. Fluid-filled distal small bowel, ascending, and transverse colon without obstruction. 2. Equivocal improvement in omental and peritoneal metastatic disease with slightly improved small volume perihepatic and perisplenic ascites. Right lower quadrant mesenteric adenopathy is similar or mildly improved. 3. Sigmoid colonic diverticulosis. Mild mural wall thickening in the sigmoid, no acute diverticulitis. 4. Gastrostomy tube in place, balloon normally positioned in the stomach. 5. Chronic bilateral L5 pars interarticularis defects with trace anterolisthesis of L5 on S1. Chronic avascular necrosis of the left femoral head.   01/16/2021 Genetic Testing   Negative genetic testing. CTNNA1 [id number] VUS, RAD51D p.G140E VUS and TSC1 p.R768H VUS found on the CancerNext-Expanded+RNAinsight.  The CancerNext-Expanded gene panel offered by [facility] and includes sequencing and rearrangement analysis for the following 77 genes: AIP, ALK, APC*, ATM*, AXIN2, BAP1, BARD1, BLM, BMPR1A, BRCA1*, BRCA2*, BRIP1*, CDC73, CDH1*, CDK4, CDKN1B, CDKN2A, CHEK2*, CTNNA1, DICER1, FANCC, FH, FLCN, GALNT12, KIF1B, LZTR1, MAX, MEN1, MET, MLH1*, MSH2*, MSH3, MSH6*, MUTYH*, NBN, NF1*, NF2, NTHL1, PALB2*, PHOX2B, PMS2*, POT1, PRKAR1A, PTCH1, PTEN*, RAD51C*, RAD51D*, RB1, RECQL, RET, SDHA, SDHAF2, SDHB, SDHC, SDHD, SMAD4, SMARCA4, SMARCB1, SMARCE1, STK11, SUFU, TMEM127, TP53*, TSC1, TSC2, VHL and XRCC2 (sequencing and deletion/duplication); EGFR, EGLN1, HOXB13, KIT, MITF, PDGFRA, POLD1, and POLE (sequencing only); EPCAM and GREM1  (deletion/duplication only). DNA and RNA analyses performed for * genes. The report date is Jan 16, 2021.   02/24/2021 Imaging   CT AP  IMPRESSION: 1. Interval resolution of previously seen small volume ascites throughout the abdomen and pelvis. There is redemonstrated, ill-defined stranding and thickening of the peritoneal surfaces and omentum, which is somewhat diminished compared to prior examination. 2. Interval improvement in right lower quadrant mesocolon lymph nodes. 3. Findings are consistent with treatment response of nodal and peritoneal metastatic disease. 4. Unchanged, matted appearing thickening of the terminal ileum and cecal base. 5. Sigmoid diverticulosis with wall thickening of the mid to distal sigmoid, similar to prior examination. Findings are most suggestive of chronic sequelae of diverticulitis.   Aortic Atherosclerosis (ICD10-I70.0).   03/13/2021 -  Chemotherapy   Patient is on Treatment Plan : COLORECTAL FOLFOXIRI + Bevacizumab q14d     05/13/2021 Procedure   OPERATIVE PROCEDURE: Laparoscopy and peritoneal biopsy.  SURGEON: Merlyn Albert. Clovis Riley, MD   05/13/2021 Pathology Results   Final Pathologic Diagnosis    A. PERITONEAL, BIOPSY :              Metastatic adenocarcinoma.               See comment.    Comment    The biopsies show predominately fibrosis  and fibroadipose tissue with a small focus of moderately differentiated adenocarcinoma. Given the patient's history, the findings favor metastasis of the patient's known colonic cancer.     07/01/2021 Imaging   CT CAP  IMPRESSION: Chest Impression:  1. No evidence of thoracic metastasis.   Abdomen / Pelvis Impression:   1. Thickening through the terminal ileum similar to prior. No ileocecal lymphadenopathy. 2. One focus linear thickening the peritoneum adjacent to loops of small bowel in the ventral abdomen which extend to the proximal sigmoid colon are more prominent than prior and concerning  for residual peritoneal carcinoma. 3. No evidence of solid organ metastasis in the abdomen pelvis.      INTERVAL HISTORY:  Ricardo [name] is here for a follow up of metastatic colon cancer. He was last seen by NP Lacie on 07/31/21. He presents to the clinic alone. He reports he feels weak today. He notes he took his anxiety and nausea medications and is starting to feel better. He reports worsening fatigue, with some stress due to his wife being gone to [place] to deal with her mother's death. He became tearful today during our discussion.   All other systems were reviewed with the patient and are negative.  MEDICAL HISTORY:  Past Medical History:  Diagnosis Date   Arthritis    Colon cancer ([place])    with metastasis   Family history of adverse reaction to anesthesia    mother had problem with it due to her asthma   Family history of breast cancer    Family history of prostate cancer     SURGICAL HISTORY: Past Surgical History:  Procedure Laterality Date   BOWEL RESECTION N/A 11/29/2020   Procedure: PARTIAL BOWEL RESECTION;  Surgeon: Jovita Kussmaul, MD;  Location: [place];  Service: General;  Laterality: N/A;   GASTROSTOMY Left 11/29/2020   Procedure: INSERTION OF GASTROSTOMY TUBE;  Surgeon: Jovita Kussmaul, MD;  Location: [place];  Service: General;  Laterality: Left;   IR IMAGING GUIDED PORT INSERTION  12/12/2020   LAPAROSCOPIC APPENDECTOMY N/A 01/17/2019   Procedure: APPENDECTOMY LAPAROSCOPIC;  Surgeon: Ralene Ok, MD;  Location: [place];  Service: General;  Laterality: N/A;   LAPAROTOMY N/A 11/29/2020   Procedure: EXPLORATORY LAPAROTOMY;  Surgeon: Jovita Kussmaul, MD;  Location: [place];  Service: General;  Laterality: N/A;  PUT CASE IN ROOM 1 STARTING AT 9:30AM FOR 120 MIN   OSTOMY N/A 11/29/2020   Procedure: POSSIBLE OSTOMY CREATION;  Surgeon: Jovita Kussmaul, MD;  Location: [place];  Service: General;  Laterality: N/A;   RECTAL BIOPSY N/A 11/29/2020   Procedure: PERITIONEAL BIOPSY;   Surgeon: Jovita Kussmaul, MD;  Location: [place];  Service: General;  Laterality: N/A;   SHOULDER SURGERY Left 09/19/2015    I have reviewed the social history and family history with the patient and they are unchanged from previous note.  ALLERGIES:  is allergic to oxaliplatin.  MEDICATIONS:  Current Outpatient Medications  Medication Sig Dispense Refill   acetaminophen (TYLENOL) 500 MG tablet Take 1,000 mg by mouth every 6 (six) hours as needed for mild pain.     ALPRAZolam (XANAX) 0.25 MG tablet Take 1-2 tablets (0.25-0.5 mg total) by mouth at bedtime as needed for anxiety or sleep. 20 tablet 0   dicyclomine (BENTYL) 10 MG capsule TAKE 1 CAPSULE (10 MG TOTAL) BY MOUTH 4 (FOUR) TIMES DAILY - BEFORE MEALS AND AT BEDTIME. 120 capsule 1   diphenoxylate-atropine (LOMOTIL) 2.5-0.025 MG tablet Take 2 tablets by mouth 4 (four) times daily as needed for diarrhea or loose stools. 60 tablet 1   hydrocortisone (ANUSOL-HC) 2.5 % rectal cream Place 1 application rectally 2 (two) times daily. 30 g 1   ondansetron (ZOFRAN)  8 MG tablet TAKE 1 TABLET BY MOUTH 2TIMES DAILY AS NEEDED FOR NAUSEA/VOMITING. START ON DAY3 AFTER CHEMOTHERAPY.     oxyCODONE (OXY IR/ROXICODONE) 5 MG immediate release tablet Take 1 tablet (5 mg total) by mouth every 8 (eight) hours as needed for severe pain. 30 tablet 0   prochlorperazine (COMPAZINE) 10 MG tablet Take 1 tablet (10 mg total) by mouth every 6 (six) hours as needed for nausea or vomiting. 30 tablet 2   zolpidem (AMBIEN) 5 MG tablet Take 1 tablet (5 mg total) by mouth at bedtime as needed for sleep. 30 tablet 0   Current Facility-Administered Medications  Medication Dose Route Frequency Provider Last Rate Last Admin   0.9 %  sodium chloride infusion   Intravenous PRN Causey, Charlestine Massed, NP        PHYSICAL EXAMINATION: ECOG PERFORMANCE STATUS: 1 - Symptomatic but completely ambulatory  Vitals:   08/14/21 1049  BP: (!) 115/93  Pulse: 92  Resp: 17  Temp: 97.7  F (36.5 C)  SpO2: 98%   Wt Readings from Last 3 Encounters:  08/14/21 259 lb 8 oz (117.7 kg)  07/31/21 261 lb 4.8 oz (118.5 kg)  07/17/21 260 lb 14.4 oz (118.3 kg)     GENERAL:alert, no distress and comfortable SKIN: skin color normal, no rashes or significant lesions EYES: normal, Conjunctiva are pink and non-injected, sclera clear  NEURO: alert & oriented x 3 with fluent speech  LABORATORY DATA:  I have reviewed the data as listed CBC Latest Ref Rng & Units 08/14/2021 07/31/2021 07/17/2021  WBC 4.0 - 10.5 K/uL 16.8(H) 15.9(H) 13.8(H)  Hemoglobin 13.0 - 17.0 g/dL 14.5 14.5 14.4  Hematocrit 39.0 - 52.0 % 42.4 43.0 42.2  Platelets 150 - 400 K/uL 254 254 196     CMP Latest Ref Rng & Units 08/14/2021 07/31/2021 07/17/2021  Glucose 70 - 99 mg/dL 91 112(H) 98  BUN 6 - 20 mg/dL '17 18 13  ' Creatinine 0.61 - 1.24 mg/dL 1.21 1.41(H) 1.11  Sodium 135 - 145 mmol/L 138 137 137  Potassium 3.5 - 5.1 mmol/L 4.2 4.2 4.1  Chloride 98 - 111 mmol/L 107 106 104  CO2 22 - 32 mmol/L '22 22 24  ' Calcium 8.9 - 10.3 mg/dL 9.1 8.9 9.1  Total Protein 6.5 - 8.1 g/dL 7.8 7.6 7.6  Total Bilirubin 0.3 - 1.2 mg/dL 0.2(L) 0.4 0.5  Alkaline Phos 38 - 126 U/L 173(H) 154(H) 132(H)  AST 15 - 41 U/L 14(L) 13(L) 14(L)  ALT 0 - 44 U/L '21 16 22      ' RADIOGRAPHIC STUDIES: I have personally reviewed the radiological images as listed and agreed with the findings in the report. No results found.    No orders of the defined types were placed in this encounter.  All questions were answered. The patient knows to call the clinic with any problems, questions or concerns. No barriers to learning was detected. The total time spent in the appointment was 30 minutes.     [name], MD 08/14/2021   I, Wilburn Mylar, am acting as scribe for [name], MD.   I have reviewed the above documentation for accuracy and completeness, and I agree with the above.

## 2021-08-14 NOTE — Patient Instructions (Signed)
[facility]   Discharge Instructions: Thank you for choosing [facility] to provide your oncology and hematology care.   If you have a lab appointment with the [facility], please go directly to the [facility] and check in at the registration area.   Wear comfortable clothing and clothing appropriate for easy access to any Portacath or PICC line.   We strive to give you quality time with your provider. You may need to reschedule your appointment if you arrive late (15 or more minutes).  Arriving late affects you and other patients whose appointments are after yours.  Also, if you miss three or more appointments without notifying the office, you may be dismissed from the clinic at the provider's discretion.      For prescription refill requests, have your pharmacy contact our office and allow 72 hours for refills to be completed.    Today you received the following chemotherapy and/or immunotherapy agents: bevacizumab-bvvr, irinotecan, leucovorin, and fluorouracil.      To help prevent nausea and vomiting after your treatment, we encourage you to take your nausea medication as directed.  BELOW ARE SYMPTOMS THAT SHOULD BE REPORTED IMMEDIATELY: *FEVER GREATER THAN 100.4 F (38 C) OR HIGHER *CHILLS OR SWEATING *NAUSEA AND VOMITING THAT IS NOT CONTROLLED WITH YOUR NAUSEA MEDICATION *UNUSUAL SHORTNESS OF BREATH *UNUSUAL BRUISING OR BLEEDING *URINARY PROBLEMS (pain or burning when urinating, or frequent urination) *BOWEL PROBLEMS (unusual diarrhea, constipation, pain near the anus) TENDERNESS IN MOUTH AND THROAT WITH OR WITHOUT PRESENCE OF ULCERS (sore throat, sores in mouth, or a toothache) UNUSUAL RASH, SWELLING OR PAIN  UNUSUAL VAGINAL DISCHARGE OR ITCHING   Items with * indicate a potential emergency and should be followed up as soon as possible or go to the Emergency Department if any problems should occur.  Please show the CHEMOTHERAPY  ALERT CARD or IMMUNOTHERAPY ALERT CARD at check-in to the Emergency Department and triage nurse.  Should you have questions after your visit or need to cancel or reschedule your appointment, please contact [facility]  Dept: [phone]  and follow the prompts.  Office hours are 8:00 a.m. to 4:30 p.m. Monday - Friday. Please note that voicemails left after 4:00 p.m. may not be returned until the following business day.  We are closed weekends and major holidays. You have access to a nurse at all times for urgent questions. Please call the main number to the clinic Dept: [phone] and follow the prompts.   For any non-urgent questions, you may also contact your provider using MyChart. We now offer e-Visits for anyone 29 and older to request care online for non-urgent symptoms. For details visit [email or web address].   Also download the MyChart app! Go to the app store, search "MyChart", open the app, select [facility], and log in with your MyChart username and password.  Due to Covid, a mask is required upon entering the hospital/clinic. If you do not have a mask, one will be given to you upon arrival. For doctor visits, patients may have 1 support person aged 107 or older with them. For treatment visits, patients cannot have anyone with them due to current Covid guidelines and our immunocompromised population.

## 2021-08-16 ENCOUNTER — Other Ambulatory Visit: Payer: Self-pay

## 2021-08-16 ENCOUNTER — Inpatient Hospital Stay: Payer: BC Managed Care – PPO

## 2021-08-16 VITALS — BP 111/85 | HR 87 | Temp 98.1°F | Resp 17

## 2021-08-16 DIAGNOSIS — C182 Malignant neoplasm of ascending colon: Secondary | ICD-10-CM

## 2021-08-16 DIAGNOSIS — C772 Secondary and unspecified malignant neoplasm of intra-abdominal lymph nodes: Secondary | ICD-10-CM

## 2021-08-16 MED ORDER — SODIUM CHLORIDE 0.9% FLUSH
10.0000 mL | INTRAVENOUS | Status: DC | PRN
  Administered 2021-08-16: 10 mL

## 2021-08-16 MED ORDER — SODIUM CHLORIDE 0.9 % IV SOLN: INTRAVENOUS | Status: DC

## 2021-08-16 MED ORDER — HEPARIN SOD (PORK) LOCK FLUSH 100 UNIT/ML IV SOLN
500.0000 [IU] | Freq: Once | INTRAVENOUS | Status: AC | PRN
  Administered 2021-08-16: 500 [IU]

## 2021-08-16 MED ORDER — PEGFILGRASTIM-CBQV 6 MG/0.6ML ~~LOC~~ SOSY
6.0000 mg | PREFILLED_SYRINGE | Freq: Once | SUBCUTANEOUS | Status: AC
  Administered 2021-08-16: 6 mg via SUBCUTANEOUS
  Filled 2021-08-16: qty 0.6

## 2021-08-21 ENCOUNTER — Telehealth: Payer: Self-pay | Admitting: Nurse Practitioner

## 2021-08-21 ENCOUNTER — Telehealth: Payer: Self-pay | Admitting: Hematology

## 2021-08-21 NOTE — Telephone Encounter (Signed)
Left message with cancelled upcoming appointments per 12/8 schedule message.

## 2021-08-21 NOTE — Telephone Encounter (Signed)
Mr. Bostock called my nurse requesting a call back about his treatment schedule.  I called him back, patient is tearful, reports being physically and mentally fatigued.  He has meditated and prayed about the decision and ultimately is requesting to skip 12/20 treatment to have a chemo holiday over Christmas.  He lost his mother in law after Thanksgiving, his son is coming in town from [place], and he wants to feel well to spend family time.  I reviewed options including give less intensive 5-FU pump alone vs skip altogether, patient prefers to skip altogether.  He will return on 09/18/2021 for next cycle as scheduled.  He knows to call sooner with new or worsening concerns, questions, or other changes.  He is very grateful.  Schedule message sent to cancel 12/20. MD notified.  Cira Rue, NP 08/21/21

## 2021-08-25 ENCOUNTER — Emergency Department (HOSPITAL_COMMUNITY): Payer: BC Managed Care – PPO

## 2021-08-25 ENCOUNTER — Other Ambulatory Visit: Payer: Self-pay

## 2021-08-25 ENCOUNTER — Encounter (HOSPITAL_COMMUNITY): Payer: Self-pay

## 2021-08-25 ENCOUNTER — Emergency Department (HOSPITAL_COMMUNITY)
Admission: EM | Admit: 2021-08-25 | Discharge: 2021-08-25 | Disposition: A | Discharge status: Home / Self Care | Payer: BC Managed Care – PPO | Source: Home / Self Care | Attending: Emergency Medicine | Admitting: Emergency Medicine

## 2021-08-25 DIAGNOSIS — K5651 Intestinal adhesions [bands], with partial obstruction: Secondary | ICD-10-CM | POA: Diagnosis not present

## 2021-08-25 DIAGNOSIS — R112 Nausea with vomiting, unspecified: Secondary | ICD-10-CM | POA: Insufficient documentation

## 2021-08-25 DIAGNOSIS — Z85038 Personal history of other malignant neoplasm of large intestine: Secondary | ICD-10-CM | POA: Insufficient documentation

## 2021-08-25 DIAGNOSIS — R109 Unspecified abdominal pain: Secondary | ICD-10-CM

## 2021-08-25 DIAGNOSIS — K56609 Unspecified intestinal obstruction, unspecified as to partial versus complete obstruction: Secondary | ICD-10-CM | POA: Diagnosis not present

## 2021-08-25 LAB — CBC WITH DIFFERENTIAL/PLATELET
Abs Immature Granulocytes: 0.13 10*3/uL — ABNORMAL HIGH (ref 0.00–0.07)
Basophils Absolute: 0 10*3/uL (ref 0.0–0.1)
Basophils Relative: 0 %
Eosinophils Absolute: 0.1 10*3/uL (ref 0.0–0.5)
Eosinophils Relative: 0 %
HCT: 42.6 % (ref 39.0–52.0)
Hemoglobin: 14.2 g/dL (ref 13.0–17.0)
Immature Granulocytes: 1 %
Lymphocytes Relative: 7 %
Lymphs Abs: 1.5 10*3/uL (ref 0.7–4.0)
MCH: 30.1 pg (ref 26.0–34.0)
MCHC: 33.3 g/dL (ref 30.0–36.0)
MCV: 90.3 fL (ref 80.0–100.0)
Monocytes Absolute: 0.9 10*3/uL (ref 0.1–1.0)
Monocytes Relative: 5 %
Neutro Abs: 18.2 10*3/uL — ABNORMAL HIGH (ref 1.7–7.7)
Neutrophils Relative %: 87 %
Platelets: 214 10*3/uL (ref 150–400)
RBC: 4.72 MIL/uL (ref 4.22–5.81)
RDW: 15.8 % — ABNORMAL HIGH (ref 11.5–15.5)
WBC: 20.9 10*3/uL — ABNORMAL HIGH (ref 4.0–10.5)
nRBC: 0 % (ref 0.0–0.2)

## 2021-08-25 LAB — COMPREHENSIVE METABOLIC PANEL
ALT: 31 U/L (ref 0–44)
AST: 19 U/L (ref 15–41)
Albumin: 3.8 g/dL (ref 3.5–5.0)
Alkaline Phosphatase: 161 U/L — ABNORMAL HIGH (ref 38–126)
Anion gap: 8 (ref 5–15)
BUN: 15 mg/dL (ref 6–20)
CO2: 22 mmol/L (ref 22–32)
Calcium: 8.8 mg/dL — ABNORMAL LOW (ref 8.9–10.3)
Chloride: 103 mmol/L (ref 98–111)
Creatinine, Ser: 1.18 mg/dL (ref 0.61–1.24)
GFR, Estimated: >60 mL/min (ref >60)
Glucose, Bld: 106 mg/dL — ABNORMAL HIGH (ref 70–99)
Potassium: 3.7 mmol/L (ref 3.5–5.1)
Sodium: 133 mmol/L — ABNORMAL LOW (ref 135–145)
Total Bilirubin: 0.5 mg/dL (ref 0.3–1.2)
Total Protein: 7.6 g/dL (ref 6.5–8.1)

## 2021-08-25 LAB — LIPASE, BLOOD: Lipase: 60 U/L — ABNORMAL HIGH (ref 11–51)

## 2021-08-25 MED ORDER — IOHEXOL 350 MG/ML SOLN
80.0000 mL | Freq: Once | INTRAVENOUS | Status: AC | PRN
  Administered 2021-08-25: 80 mL via INTRAVENOUS

## 2021-08-25 MED ORDER — FENTANYL CITRATE PF 50 MCG/ML IJ SOSY
100.0000 ug | PREFILLED_SYRINGE | Freq: Once | INTRAMUSCULAR | Status: AC
  Administered 2021-08-25: 100 ug via INTRAVENOUS
  Filled 2021-08-25: qty 2

## 2021-08-25 MED ORDER — ONDANSETRON HCL 4 MG/2ML IJ SOLN
4.0000 mg | Freq: Once | INTRAMUSCULAR | Status: AC
  Administered 2021-08-25: 4 mg via INTRAVENOUS
  Filled 2021-08-25: qty 2

## 2021-08-25 MED ORDER — HEPARIN SOD (PORK) LOCK FLUSH 100 UNIT/ML IV SOLN
500.0000 [IU] | Freq: Once | INTRAVENOUS | Status: AC
  Administered 2021-08-25: 500 [IU]
  Filled 2021-08-25: qty 5

## 2021-08-25 NOTE — ED Provider Notes (Signed)
Patient care resumed at 730 this morning from Dr. Christy Gentles.  Patient presenting with several days of abdominal pain and had nausea and vomiting and some diarrhea earlier in the week but now just nausea and vomiting.  On repeat exam patient reports he is improved and is tolerating some p.o.'s.  He is waiting on CT scan.  CT today was negative for evidence of bowel obstruction.  However there are findings suggestive of intraperitoneal metastatic disease with residual small volume of ascites and diffuse haziness throughout the omentum which has increased slightly from prior exams.  No other acute findings today.  Discussed findings with the patient.  He reports he has pain and nausea medication at home and would like to go home and will follow-up with Dr. Raelyn Mora, Loree Fee, MD 08/25/21 [phone]

## 2021-08-25 NOTE — Discharge Instructions (Signed)
No signs of blockage today with the CAT scan.  It is okay for you continue your pain and nausea medication.  Make sure you are staying hydrated.

## 2021-08-25 NOTE — ED Triage Notes (Signed)
Pt reports with mid abdominal pain and vomiting since yesterday after eating spaghetti that a church member made. Pt states that he feels like he has food poisoning.

## 2021-08-25 NOTE — ED Provider Notes (Signed)
[facility] DEPT Provider Note   CSN: [id number] Arrival date & time: 08/25/21  0414     History Chief Complaint  Patient presents with   Abdominal Cramping   Emesis    Ricardo [name] is a 55 y.o. male.  The history is provided by the patient.  Abdominal Cramping This is a new problem. The current episode started 12 to 24 hours ago. The problem occurs constantly. The problem has been rapidly worsening. Associated symptoms include abdominal pain. Pertinent negatives include no chest pain and no shortness of breath. Exacerbated by: palpation. Nothing relieves the symptoms. Treatments tried: home pain meds. The treatment provided no relief.  Emesis Severity:  Moderate Timing:  Intermittent Progression:  Worsening Chronicity:  New Relieved by:  Nothing Worsened by:  Nothing Associated symptoms: abdominal pain   Associated symptoms: no diarrhea and no fever   Patient with history of metastatic cecal carcinoma who presents with abdominal cramping and nausea/ vomiting.  Patient reports the pain began over 24 hours ago.  He reports it feels like it is cramping and "churning " He had some relief at home, then it returned after eating.  He has had episodes of nonbloody emesis.  He reports passing normal bowel movements without any blood. No chest pain or shortness of breath He is on a chemo cycle currently    Past Medical History:  Diagnosis Date   Arthritis    Colon cancer ([place])    with metastasis   Family history of adverse reaction to anesthesia    mother had problem with it due to her asthma   Family history of breast cancer    Family history of prostate cancer     Patient Active Problem List   Diagnosis Date Noted   Genetic testing 01/17/2021   Dehydration 01/04/2021   Abdominal pain 01/04/2021   Nausea & vomiting 01/04/2021   High anion gap metabolic acidosis 17/51/0258   Leukocytosis 01/04/2021   AKI (acute kidney injury) ([place])  01/03/2021   Port-A-Cath in place 01/02/2021   Family history of breast cancer    Family history of prostate cancer    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   SBO (small bowel obstruction) ([place]) 11/27/2020   Ileitis 11/27/2020   Intractable abdominal pain 11/27/2020   Nausea and vomiting 11/27/2020   Hyperlipidemia 11/27/2020   Lymphadenopathy, abdominal 11/27/2020   Body mass index (BMI) of 39.0-39.9 in adult 11/10/2018   Arthralgia 11/10/2018   Sleep disorder 11/10/2018    Past Surgical History:  Procedure Laterality Date   BOWEL RESECTION N/A 11/29/2020   Procedure: PARTIAL BOWEL RESECTION;  Surgeon: Jovita Kussmaul, MD;  Location: [facility];  Service: General;  Laterality: N/A;   GASTROSTOMY Left 11/29/2020   Procedure: INSERTION OF GASTROSTOMY TUBE;  Surgeon: Jovita Kussmaul, MD;  Location: [facility];  Service: General;  Laterality: Left;   IR IMAGING GUIDED PORT INSERTION  12/12/2020   LAPAROSCOPIC APPENDECTOMY N/A 01/17/2019   Procedure: APPENDECTOMY LAPAROSCOPIC;  Surgeon: Ralene Ok, MD;  Location: [facility];  Service: General;  Laterality: N/A;   LAPAROTOMY N/A 11/29/2020   Procedure: EXPLORATORY LAPAROTOMY;  Surgeon: Jovita Kussmaul, MD;  Location: [facility];  Service: General;  Laterality: N/A;  PUT CASE IN ROOM 1 STARTING AT 9:30AM FOR 120 MIN   OSTOMY N/A 11/29/2020   Procedure: POSSIBLE OSTOMY CREATION;  Surgeon: Jovita Kussmaul, MD;  Location: [facility];  Service: General;  Laterality: N/A;  RECTAL BIOPSY N/A 11/29/2020   Procedure: PERITIONEAL BIOPSY;  Surgeon: Jovita Kussmaul, MD;  Location: [facility] OR;  Service: General;  Laterality: N/A;   SHOULDER SURGERY Left 09/19/2015       Family History  Problem Relation Age of Onset   Cancer Sister 97       breast cancer   Cancer Brother 73       prostate cancer    High Cholesterol Brother    Pancreatic cancer Maternal Uncle    Bone cancer Cousin        pat first cousin   Colon cancer Neg Hx     Liver disease Neg Hx    Esophageal cancer Neg Hx    Stomach cancer Neg Hx     Social History   Tobacco Use   Smoking status: Never   Smokeless tobacco: Never  Vaping Use   Vaping Use: Never used  Substance Use Topics   Alcohol use: Not Currently    Comment: rare   Drug use: Never    Home Medications Prior to Admission medications   Medication Sig Start Date End Date Taking? Authorizing Provider  acetaminophen (TYLENOL) 500 MG tablet Take 1,000 mg by mouth every 6 (six) hours as needed for mild pain.    [provider]  ALPRAZolam Duanne Moron) 0.25 MG tablet Take 1-2 tablets (0.25-0.5 mg total) by mouth at bedtime as needed for anxiety or sleep. 07/17/21   [name], MD  dicyclomine (BENTYL) 10 MG capsule TAKE 1 CAPSULE (10 MG TOTAL) BY MOUTH 4 (FOUR) TIMES DAILY - BEFORE MEALS AND AT BEDTIME. 04/18/21   Alla Feeling, NP  diphenoxylate-atropine (LOMOTIL) 2.5-0.025 MG tablet Take 2 tablets by mouth 4 (four) times daily as needed for diarrhea or loose stools. 04/13/21   [name], MD  hydrocortisone (ANUSOL-HC) 2.5 % rectal cream Place 1 application rectally 2 (two) times daily. 03/27/21   Alla Feeling, NP  ondansetron (ZOFRAN) 8 MG tablet TAKE 1 TABLET BY MOUTH 2TIMES DAILY AS NEEDED FOR NAUSEA/VOMITING. START ON DAY3 AFTER CHEMOTHERAPY. 01/16/21   [provider]  oxyCODONE (OXY IR/ROXICODONE) 5 MG immediate release tablet Take 1 tablet (5 mg total) by mouth every 8 (eight) hours as needed for severe pain. 07/17/21   [name], MD  prochlorperazine (COMPAZINE) 10 MG tablet Take 1 tablet (10 mg total) by mouth every 6 (six) hours as needed for nausea or vomiting. 01/16/21   [name], MD  zolpidem (AMBIEN) 5 MG tablet Take 1 tablet (5 mg total) by mouth at bedtime as needed for sleep. 01/30/21   [name], MD    Allergies    Oxaliplatin  Review of Systems   Review of Systems  Constitutional:  Negative for fever.  Respiratory:  Negative for shortness of breath.    Cardiovascular:  Negative for chest pain.  Gastrointestinal:  Positive for abdominal pain and vomiting. Negative for blood in stool and diarrhea.  Genitourinary:  Negative for dysuria.  Musculoskeletal:  Negative for back pain.  All other systems reviewed and are negative.  Physical Exam Updated Vital Signs BP (!) 157/103 (BP Location: Right Arm)   Pulse (!) 105   Temp (!) 97.5 F (36.4 C) (Oral)   Resp 20   Ht 1.803 m (5\' 11" )   Wt 117.5 kg   SpO2 92%   BMI 36.12 kg/m   Physical Exam CONSTITUTIONAL: Well developed/well nourished, uncomfortable appearing HEAD: Normocephalic/atraumatic EYES: EOMI/PERRL ENMT: Mucous membranes moist NECK: supple no meningeal signs  SPINE/BACK:no c-spine tenderness CV: S1/S2 noted, no murmurs/rubs/gallops noted LUNGS: Lungs are clear to auscultation bilaterally, no apparent distress ABDOMEN: soft, obese and mildly distended, moderate tenderness in left upper quadrant.  Decreased bowel sounds noted throughout.  No rebound or guarding.  Abdominal scars are noted GU:no cva tenderness NEURO: Pt is awake/alert/appropriate, moves all extremitiesx4.  No facial droop.   EXTREMITIES: pulses normal/equal, full ROM SKIN: warm, color normal PSYCH: no abnormalities of mood noted, alert and oriented to situation  ED Results / Procedures / Treatments   Labs (all labs ordered are listed, but only abnormal results are displayed) Labs Reviewed  CBC WITH DIFFERENTIAL/PLATELET - Abnormal; Notable for the following components:      Result Value   WBC 20.9 (*)    RDW 15.8 (*)    Neutro Abs 18.2 (*)    Abs Immature Granulocytes 0.13 (*)    All other components within normal limits  COMPREHENSIVE METABOLIC PANEL - Abnormal; Notable for the following components:   Sodium 133 (*)    Glucose, Bld 106 (*)    Calcium 8.8 (*)    Alkaline Phosphatase 161 (*)    All other components within normal limits  LIPASE, BLOOD - Abnormal; Notable for the following  components:   Lipase 60 (*)    All other components within normal limits    EKG EKG Interpretation  Date/Time:  Monday August 25 2021 04:50:48 EST Ventricular Rate:  93 PR Interval:  158 QRS Duration: 96 QT Interval:  343 QTC Calculation: 427 R Axis:   86 Text Interpretation: Sinus rhythm No previous ECGs available T wave inversion Confirmed by Ripley Fraise [phone]) on 08/25/2021 4:54:34 AM  Radiology No results found.  Procedures Procedures   Medications Ordered in ED Medications  ondansetron (ZOFRAN) injection 4 mg (4 mg Intravenous Given 08/25/21 0522)  fentaNYL (SUBLIMAZE) injection 100 mcg (100 mcg Intravenous Given 08/25/21 0523)  fentaNYL (SUBLIMAZE) injection 100 mcg (100 mcg Intravenous Given 08/25/21 0641)  iohexol (OMNIPAQUE) 350 MG/ML injection 80 mL (80 mLs Intravenous Contrast Given 08/25/21 0641)    ED Course  I have reviewed the triage vital signs and the nursing notes.  Pertinent labs & imaging results that were available during my care of the patient were reviewed by me and considered in my medical decision making (see chart for details).    MDM Rules/Calculators/A&P                           This patient presents to the ED for concern of abdominal pain, this involves an extensive number of treatment options, and is a complaint that carries with it a high risk of complications and morbidity.  The differential diagnosis includes pancreatitis, bowel obstruction, cholecystitis, bowel perforation, gastritis, peptic ulcer, acute coronary syndrome  Lab Tests:  I Ordered, reviewed, and interpreted labs, which included electrolytes, liver function panel, complete blood count, lipase  Medicines ordered:  I ordered medication fentanyl for pain    Additional history obtained:   Previous records obtained and reviewed    Reevaluation:  After the interventions stated above, I reevaluated the patient and found pt still with pain   7:32  AM Signed out to Dr. Maryan Rued at shift change with CT imaging pending   Final Clinical Impression(s) / ED Diagnoses Final diagnoses:  None    Rx / DC Orders ED Discharge Orders     None        Ripley Fraise, MD 08/25/21  0732  

## 2021-08-25 NOTE — ED Notes (Signed)
Patient transported to CT 

## 2021-08-27 ENCOUNTER — Other Ambulatory Visit: Payer: Self-pay

## 2021-08-27 ENCOUNTER — Emergency Department (HOSPITAL_COMMUNITY): Payer: BC Managed Care – PPO

## 2021-08-27 ENCOUNTER — Inpatient Hospital Stay (HOSPITAL_COMMUNITY)
Admission: EM | Admit: 2021-08-27 | Discharge: 2021-08-29 | DRG: 389 | Disposition: A | Discharge status: Home / Self Care | Payer: BC Managed Care – PPO | Attending: Internal Medicine | Admitting: Internal Medicine

## 2021-08-27 ENCOUNTER — Encounter (HOSPITAL_COMMUNITY): Payer: Self-pay

## 2021-08-27 ENCOUNTER — Inpatient Hospital Stay (HOSPITAL_COMMUNITY): Payer: BC Managed Care – PPO

## 2021-08-27 DIAGNOSIS — Z6836 Body mass index (BMI) 36.0-36.9, adult: Secondary | ICD-10-CM | POA: Diagnosis not present

## 2021-08-27 DIAGNOSIS — Z83438 Family history of other disorder of lipoprotein metabolism and other lipidemia: Secondary | ICD-10-CM | POA: Diagnosis not present

## 2021-08-27 DIAGNOSIS — Z9221 Personal history of antineoplastic chemotherapy: Secondary | ICD-10-CM

## 2021-08-27 DIAGNOSIS — Z20822 Contact with and (suspected) exposure to covid-19: Secondary | ICD-10-CM | POA: Diagnosis present

## 2021-08-27 DIAGNOSIS — C182 Malignant neoplasm of ascending colon: Secondary | ICD-10-CM | POA: Diagnosis present

## 2021-08-27 DIAGNOSIS — C180 Malignant neoplasm of cecum: Secondary | ICD-10-CM | POA: Diagnosis present

## 2021-08-27 DIAGNOSIS — Z79899 Other long term (current) drug therapy: Secondary | ICD-10-CM | POA: Diagnosis not present

## 2021-08-27 DIAGNOSIS — K5651 Intestinal adhesions [bands], with partial obstruction: Principal | ICD-10-CM | POA: Diagnosis present

## 2021-08-27 DIAGNOSIS — C772 Secondary and unspecified malignant neoplasm of intra-abdominal lymph nodes: Secondary | ICD-10-CM | POA: Diagnosis present

## 2021-08-27 DIAGNOSIS — C786 Secondary malignant neoplasm of retroperitoneum and peritoneum: Secondary | ICD-10-CM | POA: Diagnosis present

## 2021-08-27 DIAGNOSIS — Z800 Family history of malignant neoplasm of digestive organs: Secondary | ICD-10-CM

## 2021-08-27 DIAGNOSIS — Z888 Allergy status to other drugs, medicaments and biological substances status: Secondary | ICD-10-CM | POA: Diagnosis not present

## 2021-08-27 DIAGNOSIS — R1115 Cyclical vomiting syndrome unrelated to migraine: Secondary | ICD-10-CM | POA: Diagnosis present

## 2021-08-27 DIAGNOSIS — Z9049 Acquired absence of other specified parts of digestive tract: Secondary | ICD-10-CM

## 2021-08-27 DIAGNOSIS — Z8042 Family history of malignant neoplasm of prostate: Secondary | ICD-10-CM | POA: Diagnosis not present

## 2021-08-27 DIAGNOSIS — E669 Obesity, unspecified: Secondary | ICD-10-CM | POA: Diagnosis present

## 2021-08-27 DIAGNOSIS — Z803 Family history of malignant neoplasm of breast: Secondary | ICD-10-CM

## 2021-08-27 DIAGNOSIS — M1990 Unspecified osteoarthritis, unspecified site: Secondary | ICD-10-CM | POA: Diagnosis present

## 2021-08-27 DIAGNOSIS — K56609 Unspecified intestinal obstruction, unspecified as to partial versus complete obstruction: Principal | ICD-10-CM

## 2021-08-27 LAB — CBC
HCT: 39 % (ref 39.0–52.0)
Hemoglobin: 13.2 g/dL (ref 13.0–17.0)
MCH: 30.7 pg (ref 26.0–34.0)
MCHC: 33.8 g/dL (ref 30.0–36.0)
MCV: 90.7 fL (ref 80.0–100.0)
Platelets: 202 10*3/uL (ref 150–400)
RBC: 4.3 MIL/uL (ref 4.22–5.81)
RDW: 15.9 % — ABNORMAL HIGH (ref 11.5–15.5)
WBC: 15.7 10*3/uL — ABNORMAL HIGH (ref 4.0–10.5)
nRBC: 0 % (ref 0.0–0.2)

## 2021-08-27 LAB — CBC WITH DIFFERENTIAL/PLATELET
Abs Immature Granulocytes: 0.09 10*3/uL — ABNORMAL HIGH (ref 0.00–0.07)
Basophils Absolute: 0 10*3/uL (ref 0.0–0.1)
Basophils Relative: 0 %
Eosinophils Absolute: 0.1 10*3/uL (ref 0.0–0.5)
Eosinophils Relative: 1 %
HCT: 43.2 % (ref 39.0–52.0)
Hemoglobin: 14.8 g/dL (ref 13.0–17.0)
Immature Granulocytes: 1 %
Lymphocytes Relative: 6 %
Lymphs Abs: 1.1 10*3/uL (ref 0.7–4.0)
MCH: 30.3 pg (ref 26.0–34.0)
MCHC: 34.3 g/dL (ref 30.0–36.0)
MCV: 88.3 fL (ref 80.0–100.0)
Monocytes Absolute: 0.8 10*3/uL (ref 0.1–1.0)
Monocytes Relative: 4 %
Neutro Abs: 17.2 10*3/uL — ABNORMAL HIGH (ref 1.7–7.7)
Neutrophils Relative %: 88 %
Platelets: 221 10*3/uL (ref 150–400)
RBC: 4.89 MIL/uL (ref 4.22–5.81)
RDW: 15.7 % — ABNORMAL HIGH (ref 11.5–15.5)
WBC: 19.3 10*3/uL — ABNORMAL HIGH (ref 4.0–10.5)
nRBC: 0 % (ref 0.0–0.2)

## 2021-08-27 LAB — COMPREHENSIVE METABOLIC PANEL
ALT: 26 U/L (ref 0–44)
AST: 18 U/L (ref 15–41)
Albumin: 3.9 g/dL (ref 3.5–5.0)
Alkaline Phosphatase: 152 U/L — ABNORMAL HIGH (ref 38–126)
Anion gap: 10 (ref 5–15)
BUN: 15 mg/dL (ref 6–20)
CO2: 21 mmol/L — ABNORMAL LOW (ref 22–32)
Calcium: 9.2 mg/dL (ref 8.9–10.3)
Chloride: 104 mmol/L (ref 98–111)
Creatinine, Ser: 1.23 mg/dL (ref 0.61–1.24)
GFR, Estimated: >60 mL/min (ref >60)
Glucose, Bld: 112 mg/dL — ABNORMAL HIGH (ref 70–99)
Potassium: 3.8 mmol/L (ref 3.5–5.1)
Sodium: 135 mmol/L (ref 135–145)
Total Bilirubin: 0.7 mg/dL (ref 0.3–1.2)
Total Protein: 8 g/dL (ref 6.5–8.1)

## 2021-08-27 LAB — BASIC METABOLIC PANEL
Anion gap: 9 (ref 5–15)
BUN: 13 mg/dL (ref 6–20)
CO2: 25 mmol/L (ref 22–32)
Calcium: 8.7 mg/dL — ABNORMAL LOW (ref 8.9–10.3)
Chloride: 102 mmol/L (ref 98–111)
Creatinine, Ser: 1.08 mg/dL (ref 0.61–1.24)
GFR, Estimated: >60 mL/min (ref >60)
Glucose, Bld: 107 mg/dL — ABNORMAL HIGH (ref 70–99)
Potassium: 3.9 mmol/L (ref 3.5–5.1)
Sodium: 136 mmol/L (ref 135–145)

## 2021-08-27 LAB — RESP PANEL BY RT-PCR (FLU A&B, COVID) ARPGX2
Influenza A by PCR: NEGATIVE
Influenza B by PCR: NEGATIVE
SARS Coronavirus 2 by RT PCR: NEGATIVE

## 2021-08-27 LAB — URINALYSIS, ROUTINE W REFLEX MICROSCOPIC
Bilirubin Urine: NEGATIVE
Glucose, UA: NEGATIVE mg/dL
Hgb urine dipstick: NEGATIVE
Ketones, ur: NEGATIVE mg/dL
Leukocytes,Ua: NEGATIVE
Nitrite: NEGATIVE
Protein, ur: NEGATIVE mg/dL
Specific Gravity, Urine: 1.027 (ref 1.005–1.030)
pH: 5 (ref 5.0–8.0)

## 2021-08-27 LAB — LIPASE, BLOOD: Lipase: 34 U/L (ref 11–51)

## 2021-08-27 MED ORDER — FENTANYL CITRATE PF 50 MCG/ML IJ SOSY
25.0000 ug | PREFILLED_SYRINGE | INTRAMUSCULAR | Status: DC | PRN
Start: 2021-08-27 — End: 2021-08-29
  Administered 2021-08-27 (×2): 100 ug via INTRAVENOUS
  Administered 2021-08-27: 19:00:00 50 ug via INTRAVENOUS
  Administered 2021-08-27: 06:00:00 100 ug via INTRAVENOUS
  Administered 2021-08-28: 50 ug via INTRAVENOUS
  Administered 2021-08-28 – 2021-08-29 (×5): 100 ug via INTRAVENOUS
  Filled 2021-08-27: qty 1
  Filled 2021-08-27 (×5): qty 2
  Filled 2021-08-27 (×2): qty 1
  Filled 2021-08-27 (×3): qty 2

## 2021-08-27 MED ORDER — FENTANYL CITRATE PF 50 MCG/ML IJ SOSY
100.0000 ug | PREFILLED_SYRINGE | INTRAMUSCULAR | Status: DC | PRN
  Administered 2021-08-27: 03:00:00 100 ug via INTRAVENOUS
  Filled 2021-08-27: qty 2

## 2021-08-27 MED ORDER — SODIUM CHLORIDE 0.9 % IV SOLN: Freq: Once | INTRAVENOUS | Status: AC

## 2021-08-27 MED ORDER — PANTOPRAZOLE SODIUM 40 MG IV SOLR
40.0000 mg | Freq: Once | INTRAVENOUS | Status: AC
  Administered 2021-08-27: 02:00:00 40 mg via INTRAVENOUS
  Filled 2021-08-27: qty 40

## 2021-08-27 MED ORDER — ONDANSETRON HCL 4 MG/2ML IJ SOLN
4.0000 mg | Freq: Four times a day (QID) | INTRAMUSCULAR | Status: DC | PRN
  Administered 2021-08-27 – 2021-08-29 (×2): 4 mg via INTRAVENOUS
  Filled 2021-08-27 (×2): qty 2

## 2021-08-27 MED ORDER — ACETAMINOPHEN 650 MG RE SUPP: 650.0000 mg | Freq: Four times a day (QID) | RECTAL | Status: DC | PRN

## 2021-08-27 MED ORDER — LACTATED RINGERS IV BOLUS
1000.0000 mL | Freq: Once | INTRAVENOUS | Status: AC
  Administered 2021-08-27: 03:00:00 1000 mL via INTRAVENOUS

## 2021-08-27 MED ORDER — ONDANSETRON HCL 4 MG PO TABS: 4.0000 mg | ORAL_TABLET | Freq: Four times a day (QID) | ORAL | Status: DC | PRN

## 2021-08-27 MED ORDER — ONDANSETRON HCL 4 MG/2ML IJ SOLN
4.0000 mg | Freq: Once | INTRAMUSCULAR | Status: AC
  Administered 2021-08-27: 02:00:00 4 mg via INTRAVENOUS
  Filled 2021-08-27: qty 2

## 2021-08-27 MED ORDER — ENOXAPARIN SODIUM 40 MG/0.4ML IJ SOSY
40.0000 mg | PREFILLED_SYRINGE | INTRAMUSCULAR | Status: DC
  Administered 2021-08-27 – 2021-08-28 (×2): 40 mg via SUBCUTANEOUS
  Filled 2021-08-27 (×3): qty 0.4

## 2021-08-27 MED ORDER — LACTATED RINGERS IV SOLN: INTRAVENOUS | Status: DC

## 2021-08-27 MED ORDER — DIATRIZOATE MEGLUMINE & SODIUM 66-10 % PO SOLN
90.0000 mL | Freq: Once | ORAL | Status: AC
  Administered 2021-08-27: 13:00:00 90 mL via ORAL
  Filled 2021-08-27: qty 90

## 2021-08-27 MED ORDER — FENTANYL CITRATE PF 50 MCG/ML IJ SOSY
100.0000 ug | PREFILLED_SYRINGE | Freq: Once | INTRAMUSCULAR | Status: AC
  Administered 2021-08-27: 02:00:00 100 ug via INTRAVENOUS
  Filled 2021-08-27: qty 2

## 2021-08-27 MED ORDER — ACETAMINOPHEN 325 MG PO TABS: 650.0000 mg | ORAL_TABLET | Freq: Four times a day (QID) | ORAL | Status: DC | PRN

## 2021-08-27 MED ORDER — PANTOPRAZOLE SODIUM 40 MG IV SOLR
40.0000 mg | INTRAVENOUS | Status: DC
  Administered 2021-08-27 – 2021-08-28 (×2): 40 mg via INTRAVENOUS
  Filled 2021-08-27 (×2): qty 40

## 2021-08-27 NOTE — Progress Notes (Signed)
Same day note  Patient seen and examined at bedside.  Patient was admitted to the hospital for nausea and vomiting  At the time of my evaluation, patient complains of feeling better after IV hydration.  Denies current nausea vomiting or abdominal pain.  Had a bowel movement yesterday but feels like he might have 1 today.  Physical examination reveals male with distended abdomen with mild nonspecific tenderness on palpation  Laboratory data and imaging was reviewed  Assessment and Plan.  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  History of bowel resection in the past.  Recent ED visit for abdominal cramp showing worsening metastatic colon cancer without any bowel obstruction. Xray KUB done in the ED this time showed SBO with dilation of the small bowel up to 5 cm.  CT scan of the abdomen pelvis showed intermediate to high-grade obstruction in the mid small bowel secondary to adhesive disease with transition most likely in the anterior right mid to lower abdomen.  Diffuse omental haziness noted.  Continue with n.p.o. IV fluids and analgesia.  Consider NG tube if vomiting continues.  Communicated with general surgery.   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  History of bowel resection in the past.  Patient was seen by Dr. Burr Medico oncology as outpatient for chemotherapy.  Oncology team has been notified as well.   No Charge  Signed,  Ricardo Pereyra, MD [facility]

## 2021-08-27 NOTE — Plan of Care (Signed)
  Problem: Education: Goal: Knowledge of General Education information will improve Description: Including pain rating scale, medication(s)/side effects and non-pharmacologic comfort measures Outcome: Progressing   Problem: Activity: Goal: Risk for activity intolerance will decrease Outcome: Progressing   Problem: Coping: Goal: Level of anxiety will decrease Outcome: Progressing   

## 2021-08-27 NOTE — Progress Notes (Signed)
Ricardo [name]   DOB:[birth year]   GG#:[id number]   LYY#:[id number]  Oncology follow up   Subjective: Patient is well-known to me, under my care for his metastatic colon cancer.  He is on palliative chemotherapy.  He presented emergency room with persistent nausea, vomiting, and abdominal pain.  He was found to have partial bowel obstruction.  He actually had 4 bowel movement today, abdominal pain has resolved, he feels much better now.   Objective:  Vitals:   08/27/21 1400 08/27/21 1540  BP: (!) 132/94 138/82  Pulse: 81 85  Resp: 18 16  Temp:  97.6 F (36.4 C)  SpO2: 93% 96%    Body mass index is 36.28 kg/m.  Intake/Output Summary (Last 24 hours) at 08/27/2021 1852 Last data filed at 08/27/2021 1634 Gross per 24 hour  Intake 2331.37 ml  Output --  Net 2331.37 ml     Sclerae unicteric  Oropharynx clear  No peripheral adenopathy  Lungs clear -- no rales or rhonchi  Heart regular rate and rhythm  Abdomen soft, mild diffuse tenderness   MSK no focal spinal tenderness, no peripheral edema  Neuro nonfocal    CBG (last 3)  No results for input(s): GLUCAP in the last 72 hours.   Labs:  Urine Studies No results for input(s): UHGB, CRYS in the last 72 hours.  Invalid input(s): UACOL, UAPR, USPG, UPH, UTP, UGL, UKET, UBIL, UNIT, UROB, ULEU, UEPI, UWBC, URBC, UBAC, CAST, [place], [place]  Basic Metabolic Panel: Recent Labs  Lab 08/25/21 0522 08/27/21 0126 08/27/21 0623  NA 133* 135 136  K 3.7 3.8 3.9  CL 103 104 102  CO2 22 21* 25  GLUCOSE 106* 112* 107*  BUN 15 15 13   CREATININE 1.18 1.23 1.08  CALCIUM 8.8* 9.2 8.7*   GFR Estimated Creatinine Clearance: 99.8 mL/min (by C-G formula based on SCr of 1.08 mg/dL). Liver Function Tests: Recent Labs  Lab 08/25/21 0522 08/27/21 0126  AST 19 18  ALT 31 26  ALKPHOS 161* 152*  BILITOT 0.5 0.7  PROT 7.6 8.0  ALBUMIN 3.8 3.9   Recent Labs  Lab 08/25/21 0522 08/27/21 0126  LIPASE 60* 34   No results for input(s):  AMMONIA in the last 168 hours. Coagulation profile No results for input(s): INR, PROTIME in the last 168 hours.  CBC: Recent Labs  Lab 08/25/21 0522 08/27/21 0126 08/27/21 0623  WBC 20.9* 19.3* 15.7*  NEUTROABS 18.2* 17.2*  --   HGB 14.2 14.8 13.2  HCT 42.6 43.2 39.0  MCV 90.3 88.3 90.7  PLT 214 221 202   Cardiac Enzymes: No results for input(s): CKTOTAL, CKMB, CKMBINDEX, TROPONINI in the last 168 hours. BNP: Invalid input(s): POCBNP CBG: No results for input(s): GLUCAP in the last 168 hours. D-Dimer No results for input(s): DDIMER in the last 72 hours. Hgb A1c No results for input(s): HGBA1C in the last 72 hours. Lipid Profile No results for input(s): CHOL, HDL, LDLCALC, TRIG, CHOLHDL, LDLDIRECT in the last 72 hours. Thyroid function studies No results for input(s): TSH, T4TOTAL, T3FREE, THYROIDAB in the last 72 hours.  Invalid input(s): FREET3 Anemia work up No results for input(s): VITAMINB12, FOLATE, FERRITIN, TIBC, IRON, RETICCTPCT in the last 72 hours. Microbiology Recent Results (from the past 240 hour(s))  Resp Panel by RT-PCR (Flu A&B, Covid) Nasopharyngeal Swab     Status: None   Collection Time: 08/27/21  3:00 AM   Specimen: Nasopharyngeal Swab; Nasopharyngeal(NP) swabs in vial transport medium  Result Value Ref Range Status  SARS Coronavirus 2 by RT PCR NEGATIVE NEGATIVE Final    Comment: (NOTE) SARS-CoV-2 target nucleic acids are NOT DETECTED.  The SARS-CoV-2 RNA is generally detectable in upper respiratory specimens during the acute phase of infection. The lowest concentration of SARS-CoV-2 viral copies this assay can detect is 138 copies/mL. A negative result does not preclude SARS-Cov-2 infection and should not be used as the sole basis for treatment or other patient management decisions. A negative result may occur with  improper specimen collection/handling, submission of specimen other than nasopharyngeal swab, presence of viral mutation(s)  within the areas targeted by this assay, and inadequate number of viral copies(<138 copies/mL). A negative result must be combined with clinical observations, patient history, and epidemiological information. The expected result is Negative.  Fact Sheet for Patients:  EntrepreneurPulse.com.au  Fact Sheet for Healthcare Providers:  IncredibleEmployment.be  This test is no t yet approved or cleared by the [place] FDA and  has been authorized for detection and/or diagnosis of SARS-CoV-2 by FDA under an Emergency Use Authorization (EUA). This EUA will remain  in effect (meaning this test can be used) for the duration of the COVID-19 declaration under Section 564(b)(1) of the Act, 21 U.S.C.section 360bbb-3(b)(1), unless the authorization is terminated  or revoked sooner.       Influenza A by PCR NEGATIVE NEGATIVE Final   Influenza B by PCR NEGATIVE NEGATIVE Final    Comment: (NOTE) The Xpert Xpress SARS-CoV-2/FLU/RSV plus assay is intended as an aid in the diagnosis of influenza from Nasopharyngeal swab specimens and should not be used as a sole basis for treatment. Nasal washings and aspirates are unacceptable for Xpert Xpress SARS-CoV-2/FLU/RSV testing.  Fact Sheet for Patients: EntrepreneurPulse.com.au  Fact Sheet for Healthcare Providers: IncredibleEmployment.be  This test is not yet approved or cleared by the [place] FDA and has been authorized for detection and/or diagnosis of SARS-CoV-2 by FDA under an Emergency Use Authorization (EUA). This EUA will remain in effect (meaning this test can be used) for the duration of the COVID-19 declaration under Section 564(b)(1) of the Act, 21 U.S.C. section 360bbb-3(b)(1), unless the authorization is terminated or revoked.  Performed at [facility], [place] [address] [place], [place] [address]       Studies:  CT ABDOMEN  PELVIS WO CONTRAST  Result Date: 08/27/2021 CLINICAL DATA:  Small-bowel obstruction with left lower quadrant pain. Leukocytosis also. Metastatic colon cancer. Ongoing chemotherapy. EXAM: CT ABDOMEN AND PELVIS WITHOUT CONTRAST TECHNIQUE: Multidetector CT imaging of the abdomen and pelvis was performed following the standard protocol without IV contrast. COMPARISON:  The recent CT with IV contrast 08/25/2021, CT abdomen and pelvis with IV and oral contrast 07/01/2021. FINDINGS: Lower chest: There is slight increased prominence of a few scattered epipericardial lymph nodes compared with 07/01/2021. No acute lung base findings. Hepatobiliary: No focal liver abnormality is seen. No gallstones, gallbladder wall thickening, or biliary dilatation. Pancreas: Unremarkable. No pancreatic ductal dilatation or surrounding inflammatory changes. Spleen: Unremarkable without contrast. Adrenals/Urinary Tract: No focal abnormality in the adrenal glands and renal cortex. No urinary stones or obstruction. Chronic perinephric stranding is similar. Normal bladder thickness. Stomach/Bowel: There is increasing dilatation of left upper to mid abdominal small bowel and anterior central small bowel up to 5 cm, although there is still relative decompressed appearance in the stomach and duodenum. The dilatation begins to be seen distal to the ligament of Treitz. The exact transition point could not be found but I suspect is probably related to abdominal  wall adhesive disease with multiple small bowel segments closely abutting the anterior wall on multiple slices. The transitional segment is probably in the right mid to lower abdomen anteriorly. The appendix surgically absent. Rest of the small bowel is decompressed, some of it apparently having been removed. The large intestine wall is unremarkable except for uncomplicated diverticula along the left segments. Vascular/Lymphatic: There is mild aortoiliac calcific plaque. No enlarged lymph  nodes are seen. There is a stable subcentimeter to borderline sized ileocolic mesenteric lymph node in the right lower abdomen on axial 50. Reproductive: Normal prostate. Other: Diffuse haziness in omentum extends to the subphrenic space on the left. There is increased minimal ascites in the perihepatic space and right anterior abdominal mesenteric folds. There are small inguinal fat hernias. Musculoskeletal: Chronic L5 pars defects and grade 1 L5 spondylolisthesis. No destructive bone lesions. 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 IMPRESSION: 1. Intermediate to high-grade obstruction to approximately the mid small bowel, etiology is probably adhesive disease and the transition is most likely in the anterior right mid to lower abdomen along the abdominal wall. The exact transitional segment could not be located without contrast. 2. Slight increased prominence of a few epipericardial lymph nodes. Small stable ileocolic mesenteric node. 3. Diffuse omental haziness extending into the left subphrenic space concerning for metastatic disease, with small volume of ascites. No free air. 4. Aortic atherosclerosis.  Additional findings as above. Electronically Signed   By: Telford Nab M.D.   On: 08/27/2021 04:19   DG ABD ACUTE 2+V W 1V CHEST  Result Date: 08/27/2021 CLINICAL DATA:  Metastatic colon cancer. Abdominal pain left lower quadrant with vomiting. EXAM: DG ABDOMEN ACUTE WITH 1 VIEW CHEST COMPARISON:  CT with IV contrast 08/25/2021 FINDINGS: There is increased dilatation of left mid to lower abdominal small bowel up to 5 cm concerning for small bowel obstruction. Small amount of scattered gas and stool remain present in the colon. There are stable visceral shadows. There is no evidence of free air. No pathologic calcifications. The lungs are generally clear aside from perihilar linear atelectatic changes on the left. No pleural effusion is seen. The cardiac size is normal. Right IJ port  catheter tip remains at the cavoatrial junction. IMPRESSION: Increased dilatation of the left abdominal small bowel up to 5 cm concerning for small bowel obstruction. In all other respects no further changes. Electronically Signed   By: Telford Nab M.D.   On: 08/27/2021 02:38    Assessment: 55 y.o. male   Partial small bowel obstruction, secondary to peritoneal metastasis, improving. Metastatic colon cancer to peritoneum, on chemotherapy    Plan:  -he is improving, symptoms much relieved, and he has had multiple bowel movement today. -I agree with surgery, continue conservative management, not a good candidate for surgery  -I again reinforced the importance of low fiber diet, which he was on before. Likely OK to start clear liquid and advance as tolerated  -I also encourage him to use miralax daily  -discharge per primary team and surgery -I will f/u closely in my clonic    [name], MD 08/27/2021

## 2021-08-27 NOTE — ED Triage Notes (Signed)
Pt BIB EMS, pt reports with LLQ pain and vomiting since 7 pm. Pt is unable to keep anything down.   18 g left wrist  4 mg Zofran

## 2021-08-27 NOTE — ED Provider Notes (Signed)
[facility] DEPT Provider Note: [name], MD, FACEP  CSN: [id number] MRN: [id number] ARRIVAL: 08/27/21 at [place]: [place]  Vomiting   HISTORY OF PRESENT ILLNESS  08/27/21 1:20 AM Ricardo [name] is a 55 y.o. male with history of metastatic colon cancer who was seen in the ED 2 days ago for abdominal cramping.  A CT scan at that time showed changes consistent with worsening metastatic disease.  He was discharged with plans to follow-up with Dr. Burr Medico of oncology.  He has not had anything to eat since discharge.  He did pass 2 small, hard, rocklike feces this morning.  He returns with a new left lower quadrant pain that began yesterday morning about 10 AM.  He describes it as a dull, constant pain that is not worse with movement or palpation.  He rates it as a 6 out of 10.  Since about 7 PM he has had persistent vomiting.  His abdomen feels bloated.   Past Medical History:  Diagnosis Date   Arthritis    Colon cancer (Ricardo [name])    with metastasis   Family history of adverse reaction to anesthesia    mother had problem with it due to her asthma   Family history of breast cancer    Family history of prostate cancer     Past Surgical History:  Procedure Laterality Date   BOWEL RESECTION N/A 11/29/2020   Procedure: PARTIAL BOWEL RESECTION;  Surgeon: Jovita Kussmaul, MD;  Location: [facility];  Service: General;  Laterality: N/A;   GASTROSTOMY Left 11/29/2020   Procedure: INSERTION OF GASTROSTOMY TUBE;  Surgeon: Jovita Kussmaul, MD;  Location: [facility];  Service: General;  Laterality: Left;   IR IMAGING GUIDED PORT INSERTION  12/12/2020   LAPAROSCOPIC APPENDECTOMY N/A 01/17/2019   Procedure: APPENDECTOMY LAPAROSCOPIC;  Surgeon: Ralene Ok, MD;  Location: [facility];  Service: General;  Laterality: N/A;   LAPAROTOMY N/A 11/29/2020   Procedure: EXPLORATORY LAPAROTOMY;  Surgeon: Jovita Kussmaul, MD;  Location: [facility];  Service: General;  Laterality: N/A;  PUT CASE IN ROOM 1  STARTING AT 9:30AM FOR 120 MIN   OSTOMY N/A 11/29/2020   Procedure: POSSIBLE OSTOMY CREATION;  Surgeon: Jovita Kussmaul, MD;  Location: [facility];  Service: General;  Laterality: N/A;   RECTAL BIOPSY N/A 11/29/2020   Procedure: PERITIONEAL BIOPSY;  Surgeon: Jovita Kussmaul, MD;  Location: [facility];  Service: General;  Laterality: N/A;   SHOULDER SURGERY Left 09/19/2015    Family History  Problem Relation Age of Onset   Cancer Sister 41       breast cancer   Cancer Brother 27       prostate cancer    High Cholesterol Brother    Pancreatic cancer Maternal Uncle    Bone cancer Cousin        pat first cousin   Colon cancer Neg Hx    Liver disease Neg Hx    Esophageal cancer Neg Hx    Stomach cancer Neg Hx     Social History   Tobacco Use   Smoking status: Never   Smokeless tobacco: Never  Vaping Use   Vaping Use: Never used  Substance Use Topics   Alcohol use: Not Currently    Comment: rare   Drug use: Never    Prior to Admission medications   Medication Sig Start Date End Date Taking? Authorizing Provider  acetaminophen (TYLENOL) 500 MG tablet Take 1,000 mg by mouth  every 6 (six) hours as needed for mild pain.    [provider]  ALPRAZolam Duanne Moron) 0.25 MG tablet Take 1-2 tablets (0.25-0.5 mg total) by mouth at bedtime as needed for anxiety or sleep. 07/17/21   [name], MD  dicyclomine (BENTYL) 10 MG capsule TAKE 1 CAPSULE (10 MG TOTAL) BY MOUTH 4 (FOUR) TIMES DAILY - BEFORE MEALS AND AT BEDTIME. 04/18/21   Alla Feeling, NP  diphenoxylate-atropine (LOMOTIL) 2.5-0.025 MG tablet Take 2 tablets by mouth 4 (four) times daily as needed for diarrhea or loose stools. 04/13/21   [name], MD  hydrocortisone (ANUSOL-HC) 2.5 % rectal cream Place 1 application rectally 2 (two) times daily. 03/27/21   Alla Feeling, NP  ondansetron (ZOFRAN) 8 MG tablet TAKE 1 TABLET BY MOUTH 2TIMES DAILY AS NEEDED FOR NAUSEA/VOMITING. START ON DAY3 AFTER CHEMOTHERAPY. 01/16/21   [provider]   oxyCODONE (OXY IR/ROXICODONE) 5 MG immediate release tablet Take 1 tablet (5 mg total) by mouth every 8 (eight) hours as needed for severe pain. 07/17/21   [name], MD  prochlorperazine (COMPAZINE) 10 MG tablet Take 1 tablet (10 mg total) by mouth every 6 (six) hours as needed for nausea or vomiting. 01/16/21   [name], MD  zolpidem (AMBIEN) 5 MG tablet Take 1 tablet (5 mg total) by mouth at bedtime as needed for sleep. 01/30/21   [name], MD    Allergies Oxaliplatin   REVIEW OF SYSTEMS  Negative except as noted here or in the History of Present Illness.   PHYSICAL EXAMINATION  Initial Vital Signs Blood pressure (!) 128/96, pulse (!) 103, temperature 98.2 F (36.8 C), temperature source Oral, resp. rate 15, SpO2 96 %.  Examination General: Well-developed, well-nourished male in no acute distress; appearance consistent with age of record HENT: normocephalic; atraumatic Eyes: pupils equal, round and reactive to light; extraocular muscles intact Neck: supple Heart: regular rate and rhythm Lungs: clear to auscultation bilaterally Abdomen: soft; distended; nontender; bowel sounds present Extremities: No deformity; full range of motion; pulses normal Neurologic: Awake, alert and oriented; motor function intact in all extremities and symmetric; no facial droop Skin: Warm and dry Psychiatric: Flat affect   RESULTS  Summary of this visit's results, reviewed and interpreted by myself:   EKG Interpretation  Date/Time:    Ventricular Rate:    PR Interval:    QRS Duration:   QT Interval:    QTC Calculation:   R Axis:     Text Interpretation:         Laboratory Studies: Results for orders placed or performed during the hospital encounter of 08/27/21 (from the past 24 hour(s))  Comprehensive metabolic panel     Status: Abnormal   Collection Time: 08/27/21  1:26 AM  Result Value Ref Range   Sodium 135 135 - 145 mmol/L   Potassium 3.8 3.5 - 5.1 mmol/L   Chloride 104 98 -  111 mmol/L   CO2 21 (L) 22 - 32 mmol/L   Glucose, Bld 112 (H) 70 - 99 mg/dL   BUN 15 6 - 20 mg/dL   Creatinine, Ser 1.23 0.61 - 1.24 mg/dL   Calcium 9.2 8.9 - 10.3 mg/dL   Total Protein 8.0 6.5 - 8.1 g/dL   Albumin 3.9 3.5 - 5.0 g/dL   AST 18 15 - 41 U/L   ALT 26 0 - 44 U/L   Alkaline Phosphatase 152 (H) 38 - 126 U/L   Total Bilirubin 0.7 0.3 - 1.2 mg/dL  GFR, Estimated >60 >60 mL/min   Anion gap 10 5 - 15  Lipase, blood     Status: None   Collection Time: 08/27/21  1:26 AM  Result Value Ref Range   Lipase 34 11 - 51 U/L  CBC with Diff     Status: Abnormal   Collection Time: 08/27/21  1:26 AM  Result Value Ref Range   WBC 19.3 (H) 4.0 - 10.5 K/uL   RBC 4.89 4.22 - 5.81 MIL/uL   Hemoglobin 14.8 13.0 - 17.0 g/dL   HCT 43.2 39.0 - 52.0 %   MCV 88.3 80.0 - 100.0 fL   MCH 30.3 26.0 - 34.0 pg   MCHC 34.3 30.0 - 36.0 g/dL   RDW 15.7 (H) 11.5 - 15.5 %   Platelets 221 150 - 400 K/uL   nRBC 0.0 0.0 - 0.2 %   Neutrophils Relative % 88 %   Neutro Abs 17.2 (H) 1.7 - 7.7 K/uL   Lymphocytes Relative 6 %   Lymphs Abs 1.1 0.7 - 4.0 K/uL   Monocytes Relative 4 %   Monocytes Absolute 0.8 0.1 - 1.0 K/uL   Eosinophils Relative 1 %   Eosinophils Absolute 0.1 0.0 - 0.5 K/uL   Basophils Relative 0 %   Basophils Absolute 0.0 0.0 - 0.1 K/uL   Immature Granulocytes 1 %   Abs Immature Granulocytes 0.09 (H) 0.00 - 0.07 K/uL   Imaging Studies: CT ABDOMEN PELVIS W CONTRAST  Result Date: 08/25/2021 CLINICAL DATA:  55 year old male with history of nausea and vomiting. Suspected bowel obstruction. EXAM: CT ABDOMEN AND PELVIS WITH CONTRAST TECHNIQUE: Multidetector CT imaging of the abdomen and pelvis was performed using the standard protocol following bolus administration of intravenous contrast. CONTRAST:  55mL OMNIPAQUE IOHEXOL 350 MG/ML SOLN COMPARISON:  CT of the abdomen and pelvis 07/01/2021. FINDINGS: Lower chest: Tip of central venous catheter terminating at the superior cavoatrial junction.  Mild scarring in the posterior aspect of the left upper lobe. Hepatobiliary: No definite suspicious cystic or solid hepatic lesions. No intra or extrahepatic biliary ductal dilatation. Gallbladder is normal in appearance. Pancreas: No pancreatic mass. No pancreatic ductal dilatation. No pancreatic or peripancreatic fluid collections or inflammatory changes. Spleen: Unremarkable. Adrenals/Urinary Tract: Bilateral kidneys and adrenal glands are normal in appearance. No hydroureteronephrosis. Urinary bladder is normal in appearance. Stomach/Bowel: The appearance of the stomach is normal. No pathologic dilatation of small bowel or colon. Status post appendectomy. Mass-like soft tissue thickening in the region of the terminal ileum best appreciated on axial images 52 and 53 of series 2. Vascular/Lymphatic: Aortic atherosclerosis. No lymphadenopathy noted in the abdomen or pelvis. Prominent but nonenlarged ileocolic lymph node in the right lower quadrant measuring 6 mm in short axis (axial image 50 of series 2), stable compared to the prior study, nonspecific. Reproductive: Prostate gland and seminal vesicles are unremarkable in appearance. Other: There continues to be some mild diffuse haziness throughout the omentum. Scattered volume of trace ascites is noted, most evident in the right-side of the abdomen (axial image 43 of series 2). No large peritoneal soft tissue masses are confidently identified. No pneumoperitoneum. Musculoskeletal: There are no aggressive appearing lytic or blastic lesions noted in the visualized portions of the skeleton. IMPRESSION: 1. No findings to suggest bowel obstruction. 2. However, there are findings suggestive of intraperitoneal metastatic disease, including residual small volume of ascites and diffuse haziness throughout the omentum, which has increased slightly compared to the prior examination. 3. Aortic atherosclerosis. 4. Additional incidental findings, as  above. Electronically  Signed   By: Vinnie Langton M.D.   On: 08/25/2021 07:36   DG ABD ACUTE 2+V W 1V CHEST  Result Date: 08/27/2021 CLINICAL DATA:  Metastatic colon cancer. Abdominal pain left lower quadrant with vomiting. EXAM: DG ABDOMEN ACUTE WITH 1 VIEW CHEST COMPARISON:  CT with IV contrast 08/25/2021 FINDINGS: There is increased dilatation of left mid to lower abdominal small bowel up to 5 cm concerning for small bowel obstruction. Small amount of scattered gas and stool remain present in the colon. There are stable visceral shadows. There is no evidence of free air. No pathologic calcifications. The lungs are generally clear aside from perihilar linear atelectatic changes on the left. No pleural effusion is seen. The cardiac size is normal. Right IJ port catheter tip remains at the cavoatrial junction. IMPRESSION: Increased dilatation of the left abdominal small bowel up to 5 cm concerning for small bowel obstruction. In all other respects no further changes. Electronically Signed   By: Telford Nab M.D.   On: 08/27/2021 02:38    ED COURSE and MDM  Nursing notes, initial and subsequent vitals signs, including pulse oximetry, reviewed and interpreted by myself.  Vitals:   08/27/21 0108 08/27/21 0200  BP: (!) 128/96 (!) 121/95  Pulse: (!) 103 88  Resp: 15 15  Temp: 98.2 F (36.8 C)   TempSrc: Oral   SpO2: 96% 95%   Medications  fentaNYL (SUBLIMAZE) injection 100 mcg (100 mcg Intravenous Given 08/27/21 0240)  lactated ringers bolus 1,000 mL (has no administration in time range)  0.9 %  sodium chloride infusion (has no administration in time range)  ondansetron (ZOFRAN) injection 4 mg (4 mg Intravenous Given 08/27/21 0137)  fentaNYL (SUBLIMAZE) injection 100 mcg (100 mcg Intravenous Given 08/27/21 0140)  pantoprazole (PROTONIX) injection 40 mg (40 mg Intravenous Given 08/27/21 0139)   2:52 AM Clinical presentation and radiographs consistent with an acute small bowel obstruction not seen on previous CT  scan.  3:06 AM Dr. Alcario Drought to admit to hospitalist service.  PROCEDURES  Procedures   ED DIAGNOSES     ICD-10-CM   1. Small bowel obstruction ([place])  K56.609          Domnique Vanegas, Jenny Reichmann, MD 08/27/21 [phone]

## 2021-08-27 NOTE — H&P (Signed)
History and Physical    Ricardo [name] RCB:[id number] DOB: 11/27/1965 DOA: 08/27/2021  PCP: [name], MD  Patient coming from: Home  I have personally briefly reviewed patient's old medical records in [place]  Chief Complaint: N/V  HPI: Ricardo [name] is a 55 y.o. 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  Prior bowel resection and ex lap.  Pt presented to ED x2 days ago for abd cramping.  CT AP at that time showed findings of worsening metastatic colon CA but didn't show any bowel obstruction.  No PO intake since discharge.  Had 2 small rock like feces this AM.  Returns to ED with worsening LLQ abd pain, onset 10am.  Constant, dull, 6/10.  Since 7pm has had persistent vomiting, abdomen feels bloated.   ED Course: KUB today shows findings concerning for SBO with dilation of small bowel up to 5cm.   Review of Systems: As per HPI, otherwise all review of systems negative.  Past Medical History:  Diagnosis Date   Arthritis    Colon cancer ([place])    with metastasis   Family history of adverse reaction to anesthesia    mother had problem with it due to her asthma   Family history of breast cancer    Family history of prostate cancer     Past Surgical History:  Procedure Laterality Date   BOWEL RESECTION N/A 11/29/2020   Procedure: PARTIAL BOWEL RESECTION;  Surgeon: Jovita Kussmaul, MD;  Location: [facility] OR;  Service: General;  Laterality: N/A;   GASTROSTOMY Left 11/29/2020   Procedure: INSERTION OF GASTROSTOMY TUBE;  Surgeon: Jovita Kussmaul, MD;  Location: [facility] OR;  Service: General;  Laterality: Left;   IR IMAGING GUIDED PORT INSERTION  12/12/2020   LAPAROSCOPIC APPENDECTOMY N/A 01/17/2019   Procedure: APPENDECTOMY LAPAROSCOPIC;  Surgeon: Ralene Ok, MD;  Location: [place];  Service: General;  Laterality: N/A;   LAPAROTOMY N/A 11/29/2020   Procedure: EXPLORATORY LAPAROTOMY;  Surgeon: Jovita Kussmaul, MD;   Location: [place];  Service: General;  Laterality: N/A;  PUT CASE IN ROOM 1 STARTING AT 9:30AM FOR 120 MIN   OSTOMY N/A 11/29/2020   Procedure: POSSIBLE OSTOMY CREATION;  Surgeon: Jovita Kussmaul, MD;  Location: [place];  Service: General;  Laterality: N/A;   RECTAL BIOPSY N/A 11/29/2020   Procedure: PERITIONEAL BIOPSY;  Surgeon: Jovita Kussmaul, MD;  Location: [place];  Service: General;  Laterality: N/A;   SHOULDER SURGERY Left 09/19/2015     reports that he has never smoked. He has never used smokeless tobacco. He reports that he does not currently use alcohol. He reports that he does not use drugs.  Allergies  Allergen Reactions   Oxaliplatin Hives and Other (See Comments)    15 minutes into infusion, patient started to complain of feeling warm and states " it feels like the last time I had my reaction"    Family History  Problem Relation Age of Onset   Cancer Sister 16       breast cancer   Cancer Brother 71       prostate cancer    High Cholesterol Brother    Pancreatic cancer Maternal Uncle    Bone cancer Cousin        pat first cousin   Colon cancer Neg Hx    Liver disease Neg Hx    Esophageal cancer Neg Hx    Stomach cancer Neg Hx  Prior to Admission medications   Medication Sig Start Date End Date Taking? Authorizing Provider  acetaminophen (TYLENOL) 500 MG tablet Take 500-1,000 mg by mouth every 6 (six) hours as needed for mild pain.   Yes [provider]  ALPRAZolam (XANAX) 0.25 MG tablet Take 1-2 tablets (0.25-0.5 mg total) by mouth at bedtime as needed for anxiety or sleep. 07/17/21  Yes [name], MD  dicyclomine (BENTYL) 10 MG capsule TAKE 1 CAPSULE (10 MG TOTAL) BY MOUTH 4 (FOUR) TIMES DAILY - BEFORE MEALS AND AT BEDTIME. Patient taking differently: Take 10 mg by mouth daily as needed (for constipation). 04/18/21  Yes Alla Feeling, NP  oxyCODONE (OXY IR/ROXICODONE) 5 MG immediate release tablet Take 1 tablet (5 mg total) by mouth every 8 (eight) hours as  needed for severe pain. Patient taking differently: Take 5-10 mg by mouth every 8 (eight) hours as needed for severe pain. 07/17/21  Yes [name], MD  prochlorperazine (COMPAZINE) 10 MG tablet Take 1 tablet (10 mg total) by mouth every 6 (six) hours as needed for nausea or vomiting. 01/16/21  Yes [name], MD  diphenoxylate-atropine (LOMOTIL) 2.5-0.025 MG tablet Take 2 tablets by mouth 4 (four) times daily as needed for diarrhea or loose stools. Patient not taking: Reported on 08/27/2021 04/13/21   [name], MD  hydrocortisone (ANUSOL-HC) 2.5 % rectal cream Place 1 application rectally 2 (two) times daily. Patient not taking: Reported on 08/27/2021 03/27/21   Alla Feeling, NP  ondansetron (ZOFRAN) 8 MG tablet TAKE 1 TABLET BY MOUTH 2TIMES DAILY AS NEEDED FOR NAUSEA/VOMITING. START ON DAY3 AFTER CHEMOTHERAPY. Patient not taking: Reported on 08/27/2021 01/16/21   [provider]  polyethylene glycol (MIRALAX / GLYCOLAX) 17 g packet Take 17 g by mouth daily as needed for mild constipation.    [provider]    Physical Exam: Vitals:   08/27/21 0108 08/27/21 0200  BP: (!) 128/96 (!) 121/95  Pulse: (!) 103 88  Resp: 15 15  Temp: 98.2 F (36.8 C)   TempSrc: Oral   SpO2: 96% 95%    Constitutional: Uncomfortable Eyes: PERRL, lids and conjunctivae normal ENMT: Mucous membranes are moist. Posterior pharynx clear of any exudate or lesions.Normal dentition.  Neck: normal, supple, no masses, no thyromegaly Respiratory: clear to auscultation bilaterally, no wheezing, no crackles. Normal respiratory effort. No accessory muscle use.  Cardiovascular: Regular rate and rhythm, no murmurs / rubs / gallops. No extremity edema. 2+ pedal pulses. No carotid bruits.  Abdomen: Distended, TTP Musculoskeletal: no clubbing / cyanosis. No joint deformity upper and lower extremities. Good ROM, no contractures. Normal muscle tone.  Skin: no rashes, lesions, ulcers. No induration Neurologic: CN  2-12 grossly intact. Sensation intact, DTR normal. Strength 5/5 in all 4.  Psychiatric: Normal judgment and insight. Alert and oriented x 3. Normal mood.    Labs on Admission: I have personally reviewed following labs and imaging studies  CBC: Recent Labs  Lab 08/25/21 0522 08/27/21 0126  WBC 20.9* 19.3*  NEUTROABS 18.2* 17.2*  HGB 14.2 14.8  HCT 42.6 43.2  MCV 90.3 88.3  PLT 214 824   Basic Metabolic Panel: Recent Labs  Lab 08/25/21 0522 08/27/21 0126  NA 133* 135  K 3.7 3.8  CL 103 104  CO2 22 21*  GLUCOSE 106* 112*  BUN 15 15  CREATININE 1.18 1.23  CALCIUM 8.8* 9.2   GFR: Estimated Creatinine Clearance: 87.5 mL/min (by C-G formula based on SCr of 1.23 mg/dL). Liver Function Tests:  Recent Labs  Lab 08/25/21 0522 08/27/21 0126  AST 19 18  ALT 31 26  ALKPHOS 161* 152*  BILITOT 0.5 0.7  PROT 7.6 8.0  ALBUMIN 3.8 3.9   Recent Labs  Lab 08/25/21 0522 08/27/21 0126  LIPASE 60* 34   No results for input(s): AMMONIA in the last 168 hours. Coagulation Profile: No results for input(s): INR, PROTIME in the last 168 hours. Cardiac Enzymes: No results for input(s): CKTOTAL, CKMB, CKMBINDEX, TROPONINI in the last 168 hours. BNP (last 3 results) No results for input(s): PROBNP in the last 8760 hours. HbA1C: No results for input(s): HGBA1C in the last 72 hours. CBG: No results for input(s): GLUCAP in the last 168 hours. Lipid Profile: No results for input(s): CHOL, HDL, LDLCALC, TRIG, CHOLHDL, LDLDIRECT in the last 72 hours. Thyroid Function Tests: No results for input(s): TSH, T4TOTAL, FREET4, T3FREE, THYROIDAB in the last 72 hours. Anemia Panel: No results for input(s): VITAMINB12, FOLATE, FERRITIN, TIBC, IRON, RETICCTPCT in the last 72 hours. Urine analysis:    Component Value Date/Time   COLORURINE YELLOW 01/03/2021 1856   APPEARANCEUR CLEAR 01/03/2021 1856   LABSPEC 1.015 01/03/2021 1856   PHURINE 5.5 01/03/2021 1856   GLUCOSEU NEGATIVE 01/03/2021  1856   HGBUR NEGATIVE 01/03/2021 1856   BILIRUBINUR NEGATIVE 01/03/2021 1856   KETONESUR NEGATIVE 01/03/2021 1856   PROTEINUR TRACE (A) 08/14/2021 1000   NITRITE NEGATIVE 01/03/2021 1856   LEUKOCYTESUR NEGATIVE 01/03/2021 1856    Radiological Exams on Admission: CT ABDOMEN PELVIS W CONTRAST  Result Date: 08/25/2021 CLINICAL DATA:  55 year old male with history of nausea and vomiting. Suspected bowel obstruction. EXAM: CT ABDOMEN AND PELVIS WITH CONTRAST TECHNIQUE: Multidetector CT imaging of the abdomen and pelvis was performed using the standard protocol following bolus administration of intravenous contrast. CONTRAST:  48mL OMNIPAQUE IOHEXOL 350 MG/ML SOLN COMPARISON:  CT of the abdomen and pelvis 07/01/2021. FINDINGS: Lower chest: Tip of central venous catheter terminating at the superior cavoatrial junction. Mild scarring in the posterior aspect of the left upper lobe. Hepatobiliary: No definite suspicious cystic or solid hepatic lesions. No intra or extrahepatic biliary ductal dilatation. Gallbladder is normal in appearance. Pancreas: No pancreatic mass. No pancreatic ductal dilatation. No pancreatic or peripancreatic fluid collections or inflammatory changes. Spleen: Unremarkable. Adrenals/Urinary Tract: Bilateral kidneys and adrenal glands are normal in appearance. No hydroureteronephrosis. Urinary bladder is normal in appearance. Stomach/Bowel: The appearance of the stomach is normal. No pathologic dilatation of small bowel or colon. Status post appendectomy. Mass-like soft tissue thickening in the region of the terminal ileum best appreciated on axial images 52 and 53 of series 2. Vascular/Lymphatic: Aortic atherosclerosis. No lymphadenopathy noted in the abdomen or pelvis. Prominent but nonenlarged ileocolic lymph node in the right lower quadrant measuring 6 mm in short axis (axial image 50 of series 2), stable compared to the prior study, nonspecific. Reproductive: Prostate gland and seminal  vesicles are unremarkable in appearance. Other: There continues to be some mild diffuse haziness throughout the omentum. Scattered volume of trace ascites is noted, most evident in the right-side of the abdomen (axial image 43 of series 2). No large peritoneal soft tissue masses are confidently identified. No pneumoperitoneum. Musculoskeletal: There are no aggressive appearing lytic or blastic lesions noted in the visualized portions of the skeleton. IMPRESSION: 1. No findings to suggest bowel obstruction. 2. However, there are findings suggestive of intraperitoneal metastatic disease, including residual small volume of ascites and diffuse haziness throughout the omentum, which has increased slightly compared to the  prior examination. 3. Aortic atherosclerosis. 4. Additional incidental findings, as above. Electronically Signed   By: Vinnie Langton M.D.   On: 08/25/2021 07:36   DG ABD ACUTE 2+V W 1V CHEST  Result Date: 08/27/2021 CLINICAL DATA:  Metastatic colon cancer. Abdominal pain left lower quadrant with vomiting. EXAM: DG ABDOMEN ACUTE WITH 1 VIEW CHEST COMPARISON:  CT with IV contrast 08/25/2021 FINDINGS: There is increased dilatation of left mid to lower abdominal small bowel up to 5 cm concerning for small bowel obstruction. Small amount of scattered gas and stool remain present in the colon. There are stable visceral shadows. There is no evidence of free air. No pathologic calcifications. The lungs are generally clear aside from perihilar linear atelectatic changes on the left. No pleural effusion is seen. The cardiac size is normal. Right IJ port catheter tip remains at the cavoatrial junction. IMPRESSION: Increased dilatation of the left abdominal small bowel up to 5 cm concerning for small bowel obstruction. In all other respects no further changes. Electronically Signed   By: Telford Nab M.D.   On: 08/27/2021 02:38    EKG: Independently reviewed.  Assessment/Plan Principal Problem:   SBO  (small bowel obstruction) (HCC) Active Problems:   metastatic cecal cancer    SBO - HPI + KUB are suggestive of SBO DDx includes SBO secondary to adhesions vs SBO secondary to malignancy / mass from his known peritoneal mets obstructing his bowel Will get CT non-con of AP to at least try and see if we can localize a transition point / confirm diagnosis of SBO. NPO IVF Consult gen surg in AM (assuming SBO diagnosis confirmed). Add NGT if vomiting resumes, otherwise will try to hold off Fentanyl PRN pain Metastatic colon CA - With known mets to peritoneum Seen by oncology, chemo pt of Dr Burr Medico Will put consult into Epic so he shows up on their list.  DVT prophylaxis: Lovenox Code Status: Full Family Communication: No family in room Disposition Plan: Home after SBO resolved Consults called: None called: IP consult to oncology put into Epic Admission status: Admit to inpatient  Severity of Illness: The appropriate patient status for this patient is INPATIENT. Inpatient status is judged to be reasonable and necessary in order to provide the required intensity of service to ensure the patient's safety. The patient's presenting symptoms, physical exam findings, and initial radiographic and laboratory data in the context of their chronic comorbidities is felt to place them at high risk for further clinical deterioration. Furthermore, it is not anticipated that the patient will be medically stable for discharge from the hospital within 2 midnights of admission.   * I certify that at the point of admission it is my clinical judgment that the patient will require inpatient hospital care spanning beyond 2 midnights from the point of admission due to high intensity of service, high risk for further deterioration and high frequency of surveillance required.*   Hedi Barkan M. DO [facility]  How to contact the [facility] Attending or Consulting provider [place] or covering provider during after  hours [place], for this patient?  Check the care team in [facility] and look for a) attending/consulting [facility] provider listed and b) the [facility] team listed Log into [email or web address]  Am[employer] Physician Scheduling and messaging for groups and whole hospitals  On call and physician scheduling software for group practices, residents, hospitalists and other medical providers for call, clinic, rotation and shift schedules. On[employer] is a hospital-wide system for scheduling doctors and  paging doctors on call. EasyPlot is for scientific plotting and data analysis.  [email or web address]  and use [place]'s universal password to access. If you do not have the password, please contact the hospital operator.  Locate the [facility] provider you are looking for under [facility] and page to a number that you can be directly reached. If you still have difficulty reaching the provider, please page the [facility] (Director on Call) for the Hospitalists listed on amion for assistance.  08/27/2021, 3:26 AM

## 2021-08-27 NOTE — Consult Note (Addendum)
Ricardo [name] [birth year]  [id number].    Requesting MD: Dr. Flora Lipps Chief Complaint/Reason for Consult: SBO  HPI: Ricardo [name] is a 55 y.o. 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 Burr Medico) who presented with abdominal pain n/v.   Patient reports on Sunday he started feeling like he was developing a GI bug after having baked spaghetti that somebody had made for him.  He reports left-sided, cramping abdominal pain with associated nausea and multiple episodes of emesis.  He presented to the ED on 12/2 and underwent a CT A/P that showed no evidence of SBO.  This did show finding suggestive of intraperitoneal metastatic disease and diffuse haziness throughout the omentum which has increased from prior exams. Patient reports that he was feeling better after undergoing workup, only having mild left-sided abdominal pain with resolution of nausea. He was discharged home.  Later that night he was able to tolerate soup for dinner without any increased abdominal pain or nausea or vomiting.  He reports he had a very small hard BM that night.  Symptoms remained mild until Tuesday night when he began having worsening left-sided abdominal pain similar to Sunday with associated nausea and multiple episodes of emesis.  He underwent CT A/P that showed high-grade SBO of the mid small bowel.  He was admitted to [facility]  We were consulted.  He reports this am his abdominal pain, abdominal distention and nausea have resolved.  He denies any emesis since last night.  He is actively passing flatus in the room.  Last BM was on Monday, as noted above, that was very small and hard.  Prior to this he notes he was having 2 soft BMs daily up until Sunday.  Patient has a history of ex lap, omental/peritoneal biopsies and insertion of G-tube by Dr. [name] on 11/29/2020.  He has since been on multiple  rounds of chemotherapy.  He reports his G-tube came out roughly 6 weeks after placement.  He recently [facility] and was found to have extensive peritoneal metastasis and was not felt to be a candidate for HIPEC.  He is currently on FOLFIRI and bevacizumab. Other abdominal surgeries include appendectomy.   Patient is a high school business teacher Patient lives at home with his wife.  He notes that his wife is currently in [place] after the passing of his mother-in-law He denies any tobacco, alcohol or illicit drug use  ROS: Review of Systems  Constitutional:  Negative for chills and fever.  Respiratory:  Negative for cough and shortness of breath.   Cardiovascular:  Negative for chest pain.  Gastrointestinal:  Positive for abdominal pain, nausea and vomiting.  Psychiatric/Behavioral:  Negative for substance abuse.   All other systems reviewed and are negative.  Family History  Problem Relation Age of Onset   Cancer Sister 21       breast cancer   Cancer Brother 75       prostate cancer    High Cholesterol Brother    Pancreatic cancer Maternal Uncle    Bone cancer Cousin        pat first cousin   Colon cancer Neg Hx    Liver disease Neg Hx    Esophageal cancer Neg Hx    Stomach cancer Neg Hx     Past Medical History:  Diagnosis Date   Arthritis    Colon cancer [facility])    with metastasis   Family history of adverse reaction to anesthesia    mother had problem with it due to her asthma   Family history of breast cancer    Family history of prostate cancer     Past Surgical History:  Procedure Laterality Date   BOWEL RESECTION N/A 11/29/2020   Procedure: PARTIAL BOWEL RESECTION;  Surgeon: Jovita Kussmaul, MD;  Location: [place];  Service: General;  Laterality: N/A;   GASTROSTOMY Left 11/29/2020   Procedure: INSERTION OF GASTROSTOMY TUBE;  Surgeon: Jovita Kussmaul, MD;  Location: [place];  Service: General;   Laterality: Left;   IR IMAGING GUIDED PORT INSERTION  12/12/2020   LAPAROSCOPIC APPENDECTOMY N/A 01/17/2019   Procedure: APPENDECTOMY LAPAROSCOPIC;  Surgeon: Ralene Ok, MD;  Location: [place];  Service: General;  Laterality: N/A;   LAPAROTOMY N/A 11/29/2020   Procedure: EXPLORATORY LAPAROTOMY;  Surgeon: Jovita Kussmaul, MD;  Location: [place];  Service: General;  Laterality: N/A;  PUT CASE IN ROOM 1 STARTING AT 9:30AM FOR 120 MIN   OSTOMY N/A 11/29/2020   Procedure: POSSIBLE OSTOMY CREATION;  Surgeon: Jovita Kussmaul, MD;  Location: [place];  Service: General;  Laterality: N/A;   RECTAL BIOPSY N/A 11/29/2020   Procedure: PERITIONEAL BIOPSY;  Surgeon: Jovita Kussmaul, MD;  Location: [place];  Service: General;  Laterality: N/A;   SHOULDER SURGERY Left 09/19/2015    Social History:  reports that he has never smoked. He has never used smokeless tobacco. He reports that he does not currently use alcohol. He reports that he does not use drugs.  Allergies:  Allergies  Allergen Reactions   Oxaliplatin Hives and Other (See Comments)    15 minutes into infusion, patient started to complain of feeling warm and states " it feels like the last time I had my reaction"    (Not in a hospital admission)    Physical Exam: Blood pressure (!) 105/57, pulse 67, temperature 98.2 F (36.8 C), temperature source Oral, resp. rate 17, SpO2 94 %. General: pleasant, WD/WN white male who is laying in bed in NAD HEENT: head is normocephalic, atraumatic.  Sclera are noninjected.  PERRL.  Ears and nose without any masses or lesions.  Mouth is pink and moist. Dentition fair Heart: regular, rate, and rhythm.  Normal s1,s2. No obvious murmurs, gallops, or rubs noted.  Palpable pedal pulses bilaterally  Lungs: CTAB, no wheezes, rhonchi, or rales noted.  Respiratory effort nonlabored Abd: Soft, mild distension, mild left sided abdominal pain without rigidity or guarding, +BS, no masses, hernias, or organomegaly. Prior abdominal  scars are well healed. MS: no BUE/BLE edema, calves soft and nontender Skin: warm and dry with no masses, lesions, or rashes Psych: A&Ox4 with an appropriate affect Neuro: cranial nerves grossly intact, equal strength in BUE/BLE bilaterally, normal speech, thought process intact, moves all extremities, gait not assessed   Results for orders placed or performed during the hospital encounter of 08/27/21 (from the past 48 hour(s))  Comprehensive metabolic panel     Status: Abnormal   Collection Time: 08/27/21  1:26 AM  Result Value Ref Range   Sodium 135 135 - 145 mmol/L   Potassium 3.8 3.5 - 5.1 mmol/L   Chloride 104 98 - 111 mmol/L   CO2 21 (L) 22 - 32 mmol/L   Glucose, Bld 112 (H) 70 - 99 mg/dL    Comment: Glucose reference range applies  only to samples taken after fasting for at least 8 hours.   BUN 15 6 - 20 mg/dL   Creatinine, Ser 1.23 0.61 - 1.24 mg/dL   Calcium 9.2 8.9 - 10.3 mg/dL   Total Protein 8.0 6.5 - 8.1 g/dL   Albumin 3.9 3.5 - 5.0 g/dL   AST 18 15 - 41 U/L   ALT 26 0 - 44 U/L   Alkaline Phosphatase 152 (H) 38 - 126 U/L   Total Bilirubin 0.7 0.3 - 1.2 mg/dL   GFR, Estimated >60 >60 mL/min    Comment: (NOTE) Calculated using the CKD-EPI Creatinine Equation (2021)    Anion gap 10 5 - 15    Comment: Performed at [facility], [place] [address], [place], [place] [address]  Lipase, blood     Status: None   Collection Time: 08/27/21  1:26 AM  Result Value Ref Range   Lipase 34 11 - 51 U/L    Comment: Performed at [facility], [place] [address] [place], [place] [address]  CBC with Diff     Status: Abnormal   Collection Time: 08/27/21  1:26 AM  Result Value Ref Range   WBC 19.3 (H) 4.0 - 10.5 K/uL   RBC 4.89 4.22 - 5.81 MIL/uL   Hemoglobin 14.8 13.0 - 17.0 g/dL   HCT 43.2 39.0 - 52.0 %   MCV 88.3 80.0 - 100.0 fL   MCH 30.3 26.0 - 34.0 pg   MCHC 34.3 30.0 - 36.0 g/dL   RDW 15.7 (H) 11.5 - 15.5 %   Platelets 221 150 - 400 K/uL    nRBC 0.0 0.0 - 0.2 %   Neutrophils Relative % 88 %   Neutro Abs 17.2 (H) 1.7 - 7.7 K/uL   Lymphocytes Relative 6 %   Lymphs Abs 1.1 0.7 - 4.0 K/uL   Monocytes Relative 4 %   Monocytes Absolute 0.8 0.1 - 1.0 K/uL   Eosinophils Relative 1 %   Eosinophils Absolute 0.1 0.0 - 0.5 K/uL   Basophils Relative 0 %   Basophils Absolute 0.0 0.0 - 0.1 K/uL   Immature Granulocytes 1 %   Abs Immature Granulocytes 0.09 (H) 0.00 - 0.07 K/uL    Comment: Performed at [facility], [place] [address], [place], [place] [address]  Resp Panel by RT-PCR (Flu A&B, Covid) Nasopharyngeal Swab     Status: None   Collection Time: 08/27/21  3:00 AM   Specimen: Nasopharyngeal Swab; Nasopharyngeal(NP) swabs in vial transport medium  Result Value Ref Range   SARS Coronavirus 2 by RT PCR NEGATIVE NEGATIVE    Comment: (NOTE) SARS-CoV-2 target nucleic acids are NOT DETECTED.  The SARS-CoV-2 RNA is generally detectable in upper respiratory specimens during the acute phase of infection. The lowest concentration of SARS-CoV-2 viral copies this assay can detect is 138 copies/mL. A negative result does not preclude SARS-Cov-2 infection and should not be used as the sole basis for treatment or other patient management decisions. A negative result may occur with  improper specimen collection/handling, submission of specimen other than nasopharyngeal swab, presence of viral mutation(s) within the areas targeted by this assay, and inadequate number of viral copies(<138 copies/mL). A negative result must be combined with clinical observations, patient history, and epidemiological information. The expected result is Negative.  Fact Sheet for Patients:  EntrepreneurPulse.com.au  Fact Sheet for Healthcare Providers:  IncredibleEmployment.be  This test is no t yet approved or cleared by the [place] and  has been authorized for detection and/or diagnosis of  SARS-CoV-2 by FDA under an Emergency Use Authorization (EUA). This EUA will remain  in effect (meaning this test can be used) for the duration of the COVID-19 declaration under Section 564(b)(1) of the Act, 21 U.S.C.section 360bbb-3(b)(1), unless the authorization is terminated  or revoked sooner.       Influenza A by PCR NEGATIVE NEGATIVE   Influenza B by PCR NEGATIVE NEGATIVE    Comment: (NOTE) The Xpert Xpress SARS-CoV-2/FLU/RSV plus assay is intended as an aid in the diagnosis of influenza from Nasopharyngeal swab specimens and should not be used as a sole basis for treatment. Nasal washings and aspirates are unacceptable for Xpert Xpress SARS-CoV-2/FLU/RSV testing.  Fact Sheet for Patients: EntrepreneurPulse.com.au  Fact Sheet for Healthcare Providers: IncredibleEmployment.be  This test is not yet approved or cleared by the [place] FDA and has been authorized for detection and/or diagnosis of SARS-CoV-2 by FDA under an Emergency Use Authorization (EUA). This EUA will remain in effect (meaning this test can be used) for the duration of the COVID-19 declaration under Section 564(b)(1) of the Act, 21 U.S.C. section 360bbb-3(b)(1), unless the authorization is terminated or revoked.  Performed at [facility], [place] [address], [place], [place] [address]   CBC     Status: Abnormal   Collection Time: 08/27/21  6:23 AM  Result Value Ref Range   WBC 15.7 (H) 4.0 - 10.5 K/uL   RBC 4.30 4.22 - 5.81 MIL/uL   Hemoglobin 13.2 13.0 - 17.0 g/dL   HCT 39.0 39.0 - 52.0 %   MCV 90.7 80.0 - 100.0 fL   MCH 30.7 26.0 - 34.0 pg   MCHC 33.8 30.0 - 36.0 g/dL   RDW 15.9 (H) 11.5 - 15.5 %   Platelets 202 150 - 400 K/uL   nRBC 0.0 0.0 - 0.2 %    Comment: Performed at [facility], [place] [address], [place], [place] [address]  Basic metabolic panel     Status: Abnormal   Collection Time: 08/27/21  6:23 AM  Result  Value Ref Range   Sodium 136 135 - 145 mmol/L   Potassium 3.9 3.5 - 5.1 mmol/L   Chloride 102 98 - 111 mmol/L   CO2 25 22 - 32 mmol/L   Glucose, Bld 107 (H) 70 - 99 mg/dL    Comment: Glucose reference range applies only to samples taken after fasting for at least 8 hours.   BUN 13 6 - 20 mg/dL   Creatinine, Ser 1.08 0.61 - 1.24 mg/dL   Calcium 8.7 (L) 8.9 - 10.3 mg/dL   GFR, Estimated >60 >60 mL/min    Comment: (NOTE) Calculated using the CKD-EPI Creatinine Equation (2021)    Anion gap 9 5 - 15    Comment: Performed at [facility], [place] [address], [place], [place] [address]   CT ABDOMEN PELVIS WO CONTRAST  Result Date: 08/27/2021 CLINICAL DATA:  Small-bowel obstruction with left lower quadrant pain. Leukocytosis also. Metastatic colon cancer. Ongoing chemotherapy. EXAM: CT ABDOMEN AND PELVIS WITHOUT CONTRAST TECHNIQUE: Multidetector CT imaging of the abdomen and pelvis was performed following the standard protocol without IV contrast. COMPARISON:  The recent CT with IV contrast 08/25/2021, CT abdomen and pelvis with IV and oral contrast 07/01/2021. FINDINGS: Lower chest: There is slight increased prominence of a few scattered epipericardial lymph nodes compared with 07/01/2021. No acute lung base findings. Hepatobiliary: No focal liver abnormality is seen. No gallstones, gallbladder  wall thickening, or biliary dilatation. Pancreas: Unremarkable. No pancreatic ductal dilatation or surrounding inflammatory changes. Spleen: Unremarkable without contrast. Adrenals/Urinary Tract: No focal abnormality in the adrenal glands and renal cortex. No urinary stones or obstruction. Chronic perinephric stranding is similar. Normal bladder thickness. Stomach/Bowel: There is increasing dilatation of left upper to mid abdominal small bowel and anterior central small bowel up to 5 cm, although there is still relative decompressed appearance in the stomach and duodenum. The dilatation begins to  be seen distal to the ligament of Treitz. The exact transition point could not be found but I suspect is probably related to abdominal wall adhesive disease with multiple small bowel segments closely abutting the anterior wall on multiple slices. The transitional segment is probably in the right mid to lower abdomen anteriorly. The appendix surgically absent. Rest of the small bowel is decompressed, some of it apparently having been removed. The large intestine wall is unremarkable except for uncomplicated diverticula along the left segments. Vascular/Lymphatic: There is mild aortoiliac calcific plaque. No enlarged lymph nodes are seen. There is a stable subcentimeter to borderline sized ileocolic mesenteric lymph node in the right lower abdomen on axial 50. Reproductive: Normal prostate. Other: Diffuse haziness in omentum extends to the subphrenic space on the left. There is increased minimal ascites in the perihepatic space and right anterior abdominal mesenteric folds. There are small inguinal fat hernias. Musculoskeletal: Chronic L5 pars defects and grade 1 L5 spondylolisthesis. No destructive bone lesions. 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 IMPRESSION: 1. Intermediate to high-grade obstruction to approximately the mid small bowel, etiology is probably adhesive disease and the transition is most likely in the anterior right mid to lower abdomen along the abdominal wall. The exact transitional segment could not be located without contrast. 2. Slight increased prominence of a few epipericardial lymph nodes. Small stable ileocolic mesenteric node. 3. Diffuse omental haziness extending into the left subphrenic space concerning for metastatic disease, with small volume of ascites. No free air. 4. Aortic atherosclerosis.  Additional findings as above. Electronically Signed   By: Telford Nab M.D.   On: 08/27/2021 04:19   DG ABD ACUTE 2+V W 1V CHEST  Result Date: 08/27/2021 CLINICAL  DATA:  Metastatic colon cancer. Abdominal pain left lower quadrant with vomiting. EXAM: DG ABDOMEN ACUTE WITH 1 VIEW CHEST COMPARISON:  CT with IV contrast 08/25/2021 FINDINGS: There is increased dilatation of left mid to lower abdominal small bowel up to 5 cm concerning for small bowel obstruction. Small amount of scattered gas and stool remain present in the colon. There are stable visceral shadows. There is no evidence of free air. No pathologic calcifications. The lungs are generally clear aside from perihilar linear atelectatic changes on the left. No pleural effusion is seen. The cardiac size is normal. Right IJ port catheter tip remains at the cavoatrial junction. IMPRESSION: Increased dilatation of the left abdominal small bowel up to 5 cm concerning for small bowel obstruction. In all other respects no further changes. Electronically Signed   By: Telford Nab M.D.   On: 08/27/2021 02:38    Anti-infectives (From admission, onward)    None       Assessment/Plan SBO - This is a 55 y.o. male with a history of stage IV adenocarcinoma of terminal ilium and cecum with metastatic disease to intra-abdominal lymph node and peritoneum currently on Chemo and followed by Dr. Burr Medico. His CT scan showed high-grade obstruction at the mid small bowel. Clinically patient is already improving with symptomatic  resolution of abdominal pain/distention, nausea & vomiting. He reports return of flatus.On exam he has only minimal left-sided tenderness.  There is no peritonitis.  Given improvement feel we can hold off on NG tube at this time.  If patient develops any worsening abdominal pain, distention, nausea, vomiting would recommend NG tube placement. Will start SBO protocol orally. There is no current indication for emergency surgery. - Hopefully patient will improve with conservative management.  Discussed with him if he was to fail to improve this would be a very difficult situation. His cancer is quite advanced  and he was recently found not to be a candidate for HIPEC given his extensive peritoneal metastasis. He would be at high risk for any surgery with increased risk for complications, wound problems etc. Surgery would likely also delay any further chemotherapy at least 6-8 weeks. His 2 most recent CT scans already question worsening of his disease process. Prior to this, his scan in June showed treatment response of nodal and peritoneal metastatic disease per notes. I have contacted his medical oncology team who has agreed to see him as an inpatient and weigh in.  I have also discussed with him early establishment of goals of care. He is agreeable to meet with palliative to establish goals of care. I think even if he was to improve, he may benefit from outpatient palliative. - We will follow with you closely  FEN - NPO, IVF per [facility] VTE - SCDs, okay for chemical prophylaxis from a general surgery standpoint ID - None  Jillyn Ledger, [facility] 08/27/2021, 12:05 PM Please see Amion for pager number during day hours 7:00am-4:30pm

## 2021-08-28 ENCOUNTER — Ambulatory Visit: Payer: BC Managed Care – PPO

## 2021-08-28 ENCOUNTER — Other Ambulatory Visit: Payer: BC Managed Care – PPO

## 2021-08-28 ENCOUNTER — Ambulatory Visit: Payer: BC Managed Care – PPO | Admitting: Nurse Practitioner

## 2021-08-28 LAB — CBC
HCT: 38.1 % — ABNORMAL LOW (ref 39.0–52.0)
Hemoglobin: 12.5 g/dL — ABNORMAL LOW (ref 13.0–17.0)
MCH: 30.2 pg (ref 26.0–34.0)
MCHC: 32.8 g/dL (ref 30.0–36.0)
MCV: 92 fL (ref 80.0–100.0)
Platelets: 203 10*3/uL (ref 150–400)
RBC: 4.14 MIL/uL — ABNORMAL LOW (ref 4.22–5.81)
RDW: 15.8 % — ABNORMAL HIGH (ref 11.5–15.5)
WBC: 13.7 10*3/uL — ABNORMAL HIGH (ref 4.0–10.5)
nRBC: 0 % (ref 0.0–0.2)

## 2021-08-28 LAB — BASIC METABOLIC PANEL
Anion gap: 7 (ref 5–15)
BUN: 11 mg/dL (ref 6–20)
CO2: 24 mmol/L (ref 22–32)
Calcium: 8.2 mg/dL — ABNORMAL LOW (ref 8.9–10.3)
Chloride: 104 mmol/L (ref 98–111)
Creatinine, Ser: 1.3 mg/dL — ABNORMAL HIGH (ref 0.61–1.24)
GFR, Estimated: >60 mL/min (ref >60)
Glucose, Bld: 83 mg/dL (ref 70–99)
Potassium: 3.8 mmol/L (ref 3.5–5.1)
Sodium: 135 mmol/L (ref 135–145)

## 2021-08-28 LAB — MAGNESIUM: Magnesium: 1.9 mg/dL (ref 1.7–2.4)

## 2021-08-28 NOTE — Progress Notes (Signed)
[facility] Progress Note     Subjective: CC:  Denies abdominal pain, nausea, or vomiting. Having flatus and reports 8 BMs last 24 hours. States he is NPO until 1:30 for an imaging procedure.  Objective: Vital signs in last 24 hours: Temp:  [97.6 F (36.4 C)-98.6 F (37 C)] 97.7 F (36.5 C) (12/15 0520) Pulse Rate:  [67-86] 81 (12/15 0520) Resp:  [16-83] 18 (12/15 0520) BP: (104-141)/(53-95) 104/53 (12/15 0520) SpO2:  [92 %-97 %] 97 % (12/15 0520) Weight:  [341 kg] 118 kg (12/14 1600) Last BM Date: 08/27/21  Intake/Output from previous day: 12/14 0701 - 12/15 0700 In: 2706.4 [I.V.:2706.4] Out: 1 [Stool:1] Intake/Output this shift: No intake/output data recorded.  PE: Gen:  Alert, NAD, pleasant Card:  Regular rate and rhythm, pedal pulses 2+ BL Pulm:  Normal effort, clear to auscultation bilaterally Abd: Soft, non-tender, mild distention  Skin: warm and dry, no rashes  Psych: A&Ox3   Lab Results:  Recent Labs    08/27/21 0623 08/28/21 0307  WBC 15.7* 13.7*  HGB 13.2 12.5*  HCT 39.0 38.1*  PLT 202 203   BMET Recent Labs    08/27/21 0623 08/28/21 0307  NA 136 135  K 3.9 3.8  CL 102 104  CO2 25 24  GLUCOSE 107* 83  BUN 13 11  CREATININE 1.08 1.30*  CALCIUM 8.7* 8.2*   PT/INR No results for input(s): LABPROT, INR in the last 72 hours. CMP     Component Value Date/Time   NA 135 08/28/2021 0307   NA 138 09/04/2020 0911   K 3.8 08/28/2021 0307   CL 104 08/28/2021 0307   CO2 24 08/28/2021 0307   GLUCOSE 83 08/28/2021 0307   BUN 11 08/28/2021 0307   BUN 15 09/04/2020 0911   CREATININE 1.30 (H) 08/28/2021 0307   CREATININE 1.21 08/14/2021 1000   CALCIUM 8.2 (L) 08/28/2021 0307   PROT 8.0 08/27/2021 0126   PROT 7.3 09/04/2020 0911   ALBUMIN 3.9 08/27/2021 0126   ALBUMIN 4.6 09/04/2020 0911   AST 18 08/27/2021 0126   AST 14 (L) 08/14/2021 1000   ALT 26 08/27/2021 0126   ALT 21 08/14/2021 1000   ALKPHOS 152 (H) 08/27/2021 0126    BILITOT 0.7 08/27/2021 0126   BILITOT 0.2 (L) 08/14/2021 1000   GFRNONAA >60 08/28/2021 0307   GFRNONAA >60 08/14/2021 1000   GFRAA 88 09/04/2020 0911   Lipase     Component Value Date/Time   LIPASE 34 08/27/2021 0126       Studies/Results: CT ABDOMEN PELVIS WO CONTRAST  Result Date: 08/27/2021 CLINICAL DATA:  Small-bowel obstruction with left lower quadrant pain. Leukocytosis also. Metastatic colon cancer. Ongoing chemotherapy. EXAM: CT ABDOMEN AND PELVIS WITHOUT CONTRAST TECHNIQUE: Multidetector CT imaging of the abdomen and pelvis was performed following the standard protocol without IV contrast. COMPARISON:  The recent CT with IV contrast 08/25/2021, CT abdomen and pelvis with IV and oral contrast 07/01/2021. FINDINGS: Lower chest: There is slight increased prominence of a few scattered epipericardial lymph nodes compared with 07/01/2021. No acute lung base findings. Hepatobiliary: No focal liver abnormality is seen. No gallstones, gallbladder wall thickening, or biliary dilatation. Pancreas: Unremarkable. No pancreatic ductal dilatation or surrounding inflammatory changes. Spleen: Unremarkable without contrast. Adrenals/Urinary Tract: No focal abnormality in the adrenal glands and renal cortex. No urinary stones or obstruction. Chronic perinephric stranding is similar. Normal bladder thickness. Stomach/Bowel: There is increasing dilatation of left upper to mid abdominal small bowel and anterior central  small bowel up to 5 cm, although there is still relative decompressed appearance in the stomach and duodenum. The dilatation begins to be seen distal to the ligament of Treitz. The exact transition point could not be found but I suspect is probably related to abdominal wall adhesive disease with multiple small bowel segments closely abutting the anterior wall on multiple slices. The transitional segment is probably in the right mid to lower abdomen anteriorly. The appendix surgically absent.  Rest of the small bowel is decompressed, some of it apparently having been removed. The large intestine wall is unremarkable except for uncomplicated diverticula along the left segments. Vascular/Lymphatic: There is mild aortoiliac calcific plaque. No enlarged lymph nodes are seen. There is a stable subcentimeter to borderline sized ileocolic mesenteric lymph node in the right lower abdomen on axial 50. Reproductive: Normal prostate. Other: Diffuse haziness in omentum extends to the subphrenic space on the left. There is increased minimal ascites in the perihepatic space and right anterior abdominal mesenteric folds. There are small inguinal fat hernias. Musculoskeletal: Chronic L5 pars defects and grade 1 L5 spondylolisthesis. No destructive bone lesions. 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 IMPRESSION: 1. Intermediate to high-grade obstruction to approximately the mid small bowel, etiology is probably adhesive disease and the transition is most likely in the anterior right mid to lower abdomen along the abdominal wall. The exact transitional segment could not be located without contrast. 2. Slight increased prominence of a few epipericardial lymph nodes. Small stable ileocolic mesenteric node. 3. Diffuse omental haziness extending into the left subphrenic space concerning for metastatic disease, with small volume of ascites. No free air. 4. Aortic atherosclerosis.  Additional findings as above. Electronically Signed   By: Telford Nab M.D.   On: 08/27/2021 04:19   DG ABD ACUTE 2+V W 1V CHEST  Result Date: 08/27/2021 CLINICAL DATA:  Metastatic colon cancer. Abdominal pain left lower quadrant with vomiting. EXAM: DG ABDOMEN ACUTE WITH 1 VIEW CHEST COMPARISON:  CT with IV contrast 08/25/2021 FINDINGS: There is increased dilatation of left mid to lower abdominal small bowel up to 5 cm concerning for small bowel obstruction. Small amount of scattered gas and stool remain present in the  colon. There are stable visceral shadows. There is no evidence of free air. No pathologic calcifications. The lungs are generally clear aside from perihilar linear atelectatic changes on the left. No pleural effusion is seen. The cardiac size is normal. Right IJ port catheter tip remains at the cavoatrial junction. IMPRESSION: Increased dilatation of the left abdominal small bowel up to 5 cm concerning for small bowel obstruction. In all other respects no further changes. Electronically Signed   By: Telford Nab M.D.   On: 08/27/2021 02:38   DG Abd Portable 1V-Small Bowel Obstruction Protocol-initial, 8 hr delay  Result Date: 08/27/2021 CLINICAL DATA:  Small bowel obstruction.  History of colon cancer. EXAM: PORTABLE ABDOMEN - 1 VIEW COMPARISON:  Same day. FINDINGS: Residual contrast is seen through the nondilated colon. Mildly dilated small bowel loops are noted in the upper abdomen concerning for ileus or distal small bowel obstruction. IMPRESSION: Mildly dilated small bowel loops are again noted in upper abdomen concerning for distal small bowel obstruction. Residual contrast is noted in nondilated colon. Electronically Signed   By: Marijo Conception M.D.   On: 08/27/2021 21:41    Anti-infectives: Anti-infectives (From admission, onward)    None       Assessment/Plan  SBO - This is a 55 y.o. 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 Burr Medico.  - CT scan showed high-grade obstruction at the mid small bowel. having bowel function s/p SBO protocol PO.  - ok to advance  diet as tolerated to low residue/low fiber.  - appreciate oncology seeing the patient. General surgery will sign off. Please call as needed.     FEN - ADAT per primary team; patient said NPO until 1:30 PM? So I will not advance his diet incase primary team has a planned imaging study or  procedure requiring NPO status. VTE - SCDs, okay for chemical prophylaxis from a general surgery standpoint ID - None     LOS: 1 day    Obie Dredge, [facility] Please see Amion for pager number during day hours 7:00am-4:30pm

## 2021-08-28 NOTE — Plan of Care (Signed)
  Problem: Activity: Goal: Risk for activity intolerance will decrease Outcome: Progressing   Problem: Pain Managment: Goal: General experience of comfort will improve Outcome: Progressing   

## 2021-08-28 NOTE — TOC Initial Note (Signed)
Transition of Care [facility]) - Initial/Assessment Note   Patient Details  Name: Ricardo [name] MRN: [id number] Date of Birth: [birth year]  [facility]) CM/SW Contact:    Sherie Don, LCSW Phone Number: 08/28/2021, 10:56 AM  Clinical Narrative: Readmission checklist completed due to high readmission score. CSW met with patient to complete assessment. Patient resides at home with his wife. Patient is independent at baseline with ADLs and does not have or use any DME at home. Patient transports himself to medical appointments. TOC to monitor for possible discharge needs.  Expected Discharge Plan: Home/Self Care Barriers to Discharge: Continued Medical Work up  Patient Goals and CMS Choice Patient states their goals for this hospitalization and ongoing recovery are:: Return home Choice offered to / list presented to : NA  Expected Discharge Plan and Services Expected Discharge Plan: Home/Self Care In-house Referral: Clinical Social Work Discharge Planning Services: NA Post Acute Care Choice: NA Living arrangements for the past 2 months: Single Family Home           DME Arranged: N/A DME Agency: NA  Prior Living Arrangements/Services Living arrangements for the past 2 months: Single Family Home Lives with:: Spouse Patient language and need for interpreter reviewed:: Yes Do you feel safe going back to the place where you live?: Yes      Need for Family Participation in Patient Care: No (Comment) Care giver support system in place?: Yes (comment) Criminal Activity/Legal Involvement Pertinent to Current Situation/Hospitalization: No - Comment as needed  Activities of Daily Living Home Assistive Devices/Equipment: None ADL Screening (condition at time of admission) Patient's cognitive ability adequate to safely complete daily activities?: Yes Is the patient deaf or have difficulty hearing?: No Does the patient have difficulty seeing, even when wearing glasses/contacts?:  No Does the patient have difficulty concentrating, remembering, or making decisions?: No Patient able to express need for assistance with ADLs?: Yes Does the patient have difficulty dressing or bathing?: No Independently performs ADLs?: Yes (appropriate for developmental age) Does the patient have difficulty walking or climbing stairs?: No Weakness of Legs: None Weakness of Arms/Hands: None  Emotional Assessment Appearance:: Appears stated age Attitude/Demeanor/Rapport: Engaged Affect (typically observed): Accepting Orientation: : Oriented to Self, Oriented to Place, Oriented to  Time, Oriented to Situation Alcohol / Substance Use: Not Applicable Psych Involvement: No (comment)  Admission diagnosis:  Small bowel obstruction ([place]) [K56.609] SBO (small bowel obstruction) ([place]) [K56.609] Patient Active Problem List   Diagnosis Date Noted   Genetic testing 01/17/2021   Dehydration 01/04/2021   Abdominal pain 01/04/2021   Nausea & vomiting 01/04/2021   High anion gap metabolic acidosis 49/82/6415   Leukocytosis 01/04/2021   AKI (acute kidney injury) ([place]) 01/03/2021   Port-A-Cath in place 01/02/2021   Family history of breast cancer    Family history of prostate cancer    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   SBO (small bowel obstruction) ([employer]) 11/27/2020   Ileitis 11/27/2020   Intractable abdominal pain 11/27/2020   Nausea and vomiting 11/27/2020   Hyperlipidemia 11/27/2020   Lymphadenopathy, abdominal 11/27/2020   Body mass index (BMI) of 39.0-39.9 in adult 11/10/2018   Arthralgia 11/10/2018   Sleep disorder 11/10/2018   PCP:  [name], MD Pharmacy:   [id number] - [place], [place] - [address] [place] [address] Phone: [phone] Fax: [phone]  Readmission Risk Interventions Readmission Risk Prevention Plan 08/28/2021  Transportation  Screening Complete  HRI or Home Care Consult Complete  Social Work Consult for [place] Planning/Counseling Complete  Palliative Care Screening Not Applicable  Some recent data might be hidden

## 2021-08-28 NOTE — Progress Notes (Signed)
PROGRESS NOTE  WARREN LINDAHL [facility]:[id number] DOB: August 17, 1966 DOA: 08/27/2021 PCP: [name], MD   LOS: 1 day   Brief narrative:  Ricardo [name] is a 55 y.o. 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  Ricardo [name] had presented to the ED 2 days prior to this presentation for abdominal cramps and was noted to have no evidence of bowel obstruction on the CT scan but worsening metastatic colon cancer.  A CT scan was repeated in the ED which showed small bowel obstruction so Ricardo [name] was admitted to hospital for further evaluation and treatment.  General surgery and oncology were consulted as well.    Assessment/Plan:  Principal Problem:   SBO (small bowel obstruction) (HCC) Active Problems:   metastatic cecal cancer   Partial small bowel obstruction.  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 CT scan of the abdomen pelvis showed intermediate to high-grade obstruction in the mid small bowel secondary to adhesive disease with transition most likely in the anterior right mid to lower abdomen.  Diffuse omental haziness noted.  Ricardo [name] has been seen by general surgery and oncology.  Abdominal x-ray done yesterday showed residual contrast in the colon.  Spoke with surgery this morning.  Okay to advance diet.  Ricardo [name] did tolerate her clears this morning.  We will advance to soft diet for dinner.  If continues to do well tentative plan for discharge home in the morning but   Colon cancer with metastasis to the peritoneum.  History of bowel resection in the past.  Ricardo [name] was seen by Dr. Burr Medico oncology as outpatient for chemotherapy.  Oncology has recommended outpatient follow-up.    DVT prophylaxis: enoxaparin (LOVENOX) injection 40 mg Start: 08/27/21  1000    Code Status: Full code  Family Communication: Spoke with the Ricardo [name] at bedside  Status is: Inpatient  Remains inpatient appropriate because: Small bowel obstruction,   Consultants: General surgery Oncology  Procedures: None  Anti-infectives:  None  Anti-infectives (From admission, onward)    None       Subjective: Today, Ricardo [name] was seen and examined at bedside.  Ricardo [name] denies any nausea vomiting or abdominal pain today.  Has had few bowel movements overnight.  Wants to try clears.  Objective: Vitals:   08/28/21 0150 08/28/21 0520  BP: 121/78 (!) 104/53  Pulse: 78 81  Resp: 18 18  Temp: 98.6 F (37 C) 97.7 F (36.5 C)  SpO2: 97% 97%    Intake/Output Summary (Last 24 hours) at 08/28/2021 0709 Last data filed at 08/28/2021 0500 Gross per 24 hour  Intake 2706.37 ml  Output 1 ml  Net 2705.37 ml   Filed Weights   08/27/21 1600  Weight: 118 kg   Body mass index is 36.28 kg/m.   Physical Exam: GENERAL: Ricardo [name] is alert awake and oriented. Not in obvious distress. HENT: No scleral pallor or icterus. Pupils equally reactive to light. Oral mucosa is moist NECK: is supple, no gross swelling noted. CHEST: Clear to auscultation. No crackles or wheezes.  Diminished breath sounds bilaterally.  Implanted port on the right chest wall. CVS: S1 and S2 heard, no murmur. Regular rate and rhythm.  ABDOMEN: Soft, non-tender, bowel sounds are present.  Midline scar from previous surgery noted. EXTREMITIES: No edema. CNS: Cranial nerves are intact. No focal  motor deficits. SKIN: warm and dry without rashes.  Data Review: I have personally reviewed the following laboratory data and studies,  CBC: Recent Labs  Lab 08/25/21 0522 08/27/21 0126 08/27/21 0623 08/28/21 0307  WBC 20.9* 19.3* 15.7* 13.7*  NEUTROABS 18.2* 17.2*  --   --   HGB 14.2 14.8 13.2 12.5*  HCT 42.6 43.2 39.0 38.1*  MCV 90.3 88.3 90.7 92.0  PLT 214 221 202 672   Basic Metabolic  Panel: Recent Labs  Lab 08/25/21 0522 08/27/21 0126 08/27/21 0623 08/28/21 0307  NA 133* 135 136 135  K 3.7 3.8 3.9 3.8  CL 103 104 102 104  CO2 22 21* 25 24  GLUCOSE 106* 112* 107* 83  BUN 15 15 13 11   CREATININE 1.18 1.23 1.08 1.30*  CALCIUM 8.8* 9.2 8.7* 8.2*  MG  --   --   --  1.9   Liver Function Tests: Recent Labs  Lab 08/25/21 0522 08/27/21 0126  AST 19 18  ALT 31 26  ALKPHOS 161* 152*  BILITOT 0.5 0.7  PROT 7.6 8.0  ALBUMIN 3.8 3.9   Recent Labs  Lab 08/25/21 0522 08/27/21 0126  LIPASE 60* 34   No results for input(s): AMMONIA in the last 168 hours. Cardiac Enzymes: No results for input(s): CKTOTAL, CKMB, CKMBINDEX, TROPONINI in the last 168 hours. BNP (last 3 results) No results for input(s): BNP in the last 8760 hours.  ProBNP (last 3 results) No results for input(s): PROBNP in the last 8760 hours.  CBG: No results for input(s): GLUCAP in the last 168 hours. Recent Results (from the past 240 hour(s))  Resp Panel by RT-PCR (Flu A&B, Covid) Nasopharyngeal Swab     Status: None   Collection Time: 08/27/21  3:00 AM   Specimen: Nasopharyngeal Swab; Nasopharyngeal(NP) swabs in vial transport medium  Result Value Ref Range Status   SARS Coronavirus 2 by RT PCR NEGATIVE NEGATIVE Final    Comment: (NOTE) SARS-CoV-2 target nucleic acids are NOT DETECTED.  The SARS-CoV-2 RNA is generally detectable in upper respiratory specimens during the acute phase of infection. The lowest concentration of SARS-CoV-2 viral copies this assay can detect is 138 copies/mL. A negative result does not preclude SARS-Cov-2 infection and should not be used as the sole basis for treatment or other Ricardo [name] management decisions. A negative result may occur with  improper specimen collection/handling, submission of specimen other than nasopharyngeal swab, presence of viral mutation(s) within the areas targeted by this assay, and inadequate number of viral copies(<138 copies/mL). A  negative result must be combined with clinical observations, Ricardo [name] history, and epidemiological information. The expected result is Negative.  Fact Sheet for Patients:  EntrepreneurPulse.com.au  Fact Sheet for Healthcare Providers:  IncredibleEmployment.be  This test is no t yet approved or cleared by the [place] FDA and  has been authorized for detection and/or diagnosis of SARS-CoV-2 by FDA under an Emergency Use Authorization (EUA). This EUA will remain  in effect (meaning this test can be used) for the duration of the COVID-19 declaration under Section 564(b)(1) of the Act, 21 U.S.C.section 360bbb-3(b)(1), unless the authorization is terminated  or revoked sooner.       Influenza A by PCR NEGATIVE NEGATIVE Final   Influenza B by PCR NEGATIVE NEGATIVE Final    Comment: (NOTE) The Xpert Xpress SARS-CoV-2/FLU/RSV plus assay is intended as an aid in the diagnosis of influenza from Nasopharyngeal swab specimens and should not be used as a sole basis for treatment. Nasal washings  and aspirates are unacceptable for Xpert Xpress SARS-CoV-2/FLU/RSV testing.  Fact Sheet for Patients: EntrepreneurPulse.com.au  Fact Sheet for Healthcare Providers: IncredibleEmployment.be  This test is not yet approved or cleared by the [place] FDA and has been authorized for detection and/or diagnosis of SARS-CoV-2 by FDA under an Emergency Use Authorization (EUA). This EUA will remain in effect (meaning this test can be used) for the duration of the COVID-19 declaration under Section 564(b)(1) of the Act, 21 U.S.C. section 360bbb-3(b)(1), unless the authorization is terminated or revoked.  Performed at [facility], [place] [address], [place], [place] [address]      Studies: CT ABDOMEN PELVIS WO CONTRAST  Result Date: 08/27/2021 CLINICAL DATA:  Small-bowel obstruction with left lower  quadrant pain. Leukocytosis also. Metastatic colon cancer. Ongoing chemotherapy. EXAM: CT ABDOMEN AND PELVIS WITHOUT CONTRAST TECHNIQUE: Multidetector CT imaging of the abdomen and pelvis was performed following the standard protocol without IV contrast. COMPARISON:  The recent CT with IV contrast 08/25/2021, CT abdomen and pelvis with IV and oral contrast 07/01/2021. FINDINGS: Lower chest: There is slight increased prominence of a few scattered epipericardial lymph nodes compared with 07/01/2021. No acute lung base findings. Hepatobiliary: No focal liver abnormality is seen. No gallstones, gallbladder wall thickening, or biliary dilatation. Pancreas: Unremarkable. No pancreatic ductal dilatation or surrounding inflammatory changes. Spleen: Unremarkable without contrast. Adrenals/Urinary Tract: No focal abnormality in the adrenal glands and renal cortex. No urinary stones or obstruction. Chronic perinephric stranding is similar. Normal bladder thickness. Stomach/Bowel: There is increasing dilatation of left upper to mid abdominal small bowel and anterior central small bowel up to 5 cm, although there is still relative decompressed appearance in the stomach and duodenum. The dilatation begins to be seen distal to the ligament of Treitz. The exact transition point could not be found but I suspect is probably related to abdominal wall adhesive disease with multiple small bowel segments closely abutting the anterior wall on multiple slices. The transitional segment is probably in the right mid to lower abdomen anteriorly. The appendix surgically absent. Rest of the small bowel is decompressed, some of it apparently having been removed. The large intestine wall is unremarkable except for uncomplicated diverticula along the left segments. Vascular/Lymphatic: There is mild aortoiliac calcific plaque. No enlarged lymph nodes are seen. There is a stable subcentimeter to borderline sized ileocolic mesenteric lymph node in the  right lower abdomen on axial 50. Reproductive: Normal prostate. Other: Diffuse haziness in omentum extends to the subphrenic space on the left. There is increased minimal ascites in the perihepatic space and right anterior abdominal mesenteric folds. There are small inguinal fat hernias. Musculoskeletal: Chronic L5 pars defects and grade 1 L5 spondylolisthesis. No destructive bone lesions. 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 IMPRESSION: 1. Intermediate to high-grade obstruction to approximately the mid small bowel, etiology is probably adhesive disease and the transition is most likely in the anterior right mid to lower abdomen along the abdominal wall. The exact transitional segment could not be located without contrast. 2. Slight increased prominence of a few epipericardial lymph nodes. Small stable ileocolic mesenteric node. 3. Diffuse omental haziness extending into the left subphrenic space concerning for metastatic disease, with small volume of ascites. No free air. 4. Aortic atherosclerosis.  Additional findings as above. Electronically Signed   By: Telford Nab M.D.   On: 08/27/2021 04:19   DG ABD ACUTE 2+V W 1V CHEST  Result Date: 08/27/2021 CLINICAL DATA:  Metastatic colon cancer. Abdominal pain left lower  quadrant with vomiting. EXAM: DG ABDOMEN ACUTE WITH 1 VIEW CHEST COMPARISON:  CT with IV contrast 08/25/2021 FINDINGS: There is increased dilatation of left mid to lower abdominal small bowel up to 5 cm concerning for small bowel obstruction. Small amount of scattered gas and stool remain present in the colon. There are stable visceral shadows. There is no evidence of free air. No pathologic calcifications. The lungs are generally clear aside from perihilar linear atelectatic changes on the left. No pleural effusion is seen. The cardiac size is normal. Right IJ port catheter tip remains at the cavoatrial junction. IMPRESSION: Increased dilatation of the left abdominal small  bowel up to 5 cm concerning for small bowel obstruction. In all other respects no further changes. Electronically Signed   By: Telford Nab M.D.   On: 08/27/2021 02:38   DG Abd Portable 1V-Small Bowel Obstruction Protocol-initial, 8 hr delay  Result Date: 08/27/2021 CLINICAL DATA:  Small bowel obstruction.  History of colon cancer. EXAM: PORTABLE ABDOMEN - 1 VIEW COMPARISON:  Same day. FINDINGS: Residual contrast is seen through the nondilated colon. Mildly dilated small bowel loops are noted in the upper abdomen concerning for ileus or distal small bowel obstruction. IMPRESSION: Mildly dilated small bowel loops are again noted in upper abdomen concerning for distal small bowel obstruction. Residual contrast is noted in nondilated colon. Electronically Signed   By: Marijo Conception M.D.   On: 08/27/2021 21:41      Flora Lipps, MD  [facility] 08/28/2021  If 7PM-7AM, please contact night-coverage

## 2021-08-28 NOTE — Plan of Care (Signed)
  Problem: Education: Goal: Knowledge of General Education information will improve Description: Including pain rating scale, medication(s)/side effects and non-pharmacologic comfort measures Outcome: Progressing   Problem: Nutrition: Goal: Adequate nutrition will be maintained Outcome: Progressing   

## 2021-08-29 LAB — BASIC METABOLIC PANEL
Anion gap: 9 (ref 5–15)
BUN: 7 mg/dL (ref 6–20)
CO2: 25 mmol/L (ref 22–32)
Calcium: 8.7 mg/dL — ABNORMAL LOW (ref 8.9–10.3)
Chloride: 101 mmol/L (ref 98–111)
Creatinine, Ser: 1 mg/dL (ref 0.61–1.24)
GFR, Estimated: >60 mL/min (ref >60)
Glucose, Bld: 104 mg/dL — ABNORMAL HIGH (ref 70–99)
Potassium: 3.7 mmol/L (ref 3.5–5.1)
Sodium: 135 mmol/L (ref 135–145)

## 2021-08-29 LAB — CBC
HCT: 39 % (ref 39.0–52.0)
Hemoglobin: 13.1 g/dL (ref 13.0–17.0)
MCH: 29.8 pg (ref 26.0–34.0)
MCHC: 33.6 g/dL (ref 30.0–36.0)
MCV: 88.8 fL (ref 80.0–100.0)
Platelets: 224 10*3/uL (ref 150–400)
RBC: 4.39 MIL/uL (ref 4.22–5.81)
RDW: 15.2 % (ref 11.5–15.5)
WBC: 16.1 10*3/uL — ABNORMAL HIGH (ref 4.0–10.5)
nRBC: 0 % (ref 0.0–0.2)

## 2021-08-29 MED ORDER — PANTOPRAZOLE SODIUM 40 MG PO TBEC: 40.0000 mg | DELAYED_RELEASE_TABLET | Freq: Every day | ORAL | 1 refills | Status: DC

## 2021-08-29 MED ORDER — CHLORHEXIDINE GLUCONATE CLOTH 2 % EX PADS: 6.0000 | MEDICATED_PAD | Freq: Every day | CUTANEOUS | Status: DC

## 2021-08-29 MED ORDER — HEPARIN SOD (PORK) LOCK FLUSH 100 UNIT/ML IV SOLN
500.0000 [IU] | INTRAVENOUS | Status: AC | PRN
  Administered 2021-08-29: 500 [IU]
  Filled 2021-08-29: qty 5

## 2021-08-29 MED ORDER — SODIUM CHLORIDE 0.9% FLUSH
10.0000 mL | Freq: Two times a day (BID) | INTRAVENOUS | Status: DC
Start: 2021-08-29 — End: 2021-08-29

## 2021-08-29 NOTE — Discharge Summary (Signed)
Physician Discharge Summary  Ricardo [name] HDQ:[id number] DOB: [birth year] DOA: 08/27/2021  PCP: [name], MD  Admit date: 08/27/2021 Discharge date: 08/29/2021  Admitted From: Home  Discharge disposition: Home  Recommendations for Outpatient Follow-Up:   Follow up with your primary care provider in one week.  Check CBC, BMP, magnesium in the next visit  Discharge Diagnosis:   Principal Problem:   SBO (small bowel obstruction) (HCC) Active Problems:   metastatic cecal cancer  Discharge Condition: Improved.  Diet recommendation: soft,  Regular.  Low-fat diet.  Wound care: None.  Code status: Full.   History of Present Illness:    Ricardo [name] is a 55 y.o. 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  Patient had presented to the ED 2 days prior to this presentation for abdominal cramps and was noted to have no evidence of bowel obstruction on the CT scan but worsening metastatic colon cancer.  A CT scan was repeated in the ED which showed small bowel obstruction so patient was admitted to hospital for further evaluation and treatment.    Hospital Course:   Following conditions were addressed during hospitalization as listed below,  Partial small bowel obstruction.   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 CT scan of the abdomen pelvis showed intermediate to high-grade obstruction in the mid small bowel secondary to adhesive disease with transition most likely in the anterior right mid to lower abdomen.  Diffuse omental haziness noted.  Patient was seen by general surgery and oncology.  Patient underwent conservative treatment with IV fluids, n.p.o. analgesics antiemetics.  Abdominal x-ray done  showed residual  contrast in the colon.  Diet was advanced.  Patient did have a bowel movement and passed flatus.  Overall has tolerated diet.   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  History of bowel resection in the past.  Patient was seen by Dr. Burr Medico oncology as outpatient for chemotherapy.  Oncology has recommended outpatient follow-up.  Patient is supposed to follow-up with oncology in January.  Disposition.  At this time, patient is stable for disposition home with outpatient PCP and oncology follow-up  Medical Consultants:   General surgery Oncology  Procedures:    None Subjective:   Today, patient was seen and examined at bedside.  Patient had 1 episode of mild vomiting yesterday but overall feels better.  Has been having bowel movements.  Denies overt pain.  Discharge Exam:   Vitals:   08/28/21 2120 08/29/21 0507  BP: 105/65 117/69  Pulse: 77 80  Resp: 16 18  Temp: 98.3 F (36.8 C) 98.5 F (36.9 C)  SpO2: 97% 97%   Vitals:   08/28/21 0520 08/28/21 1238 08/28/21 2120 08/29/21 0507  BP: (!) 104/53 127/79 105/65 117/69  Pulse: 81 82 77 80  Resp: 18  16 18   Temp: 97.7 F (36.5 C) 98.2 F (36.8 C) 98.3 F (36.8 C) 98.5 F (36.9 C)  TempSrc: Oral  Oral Oral  SpO2: 97% 98% 97% 97%  Weight:      Height:       General: Alert awake, not in obvious distress, obese HENT: pupils equally reacting to light,  No scleral pallor or icterus noted. Oral mucosa is moist.  Chest:  Clear breath sounds.  Diminished breath sounds bilaterally.  No crackles or wheezes.  Chest wall port in place CVS: S1 &S2 heard. No murmur.  Regular rate and rhythm. Abdomen: Soft, nontender, nondistended.  Bowel sounds are heard.  Midline abdominal scar noted Extremities: No cyanosis, clubbing or edema.  Peripheral pulses are palpable. Psych: Alert, awake and oriented, normal mood CNS:  No cranial nerve deficits.  Power equal in all extremities.   Skin: Warm and dry.  No rashes noted.  The results of  significant diagnostics from this hospitalization (including imaging, microbiology, ancillary and laboratory) are listed below for reference.     Diagnostic Studies:   CT ABDOMEN PELVIS WO CONTRAST  Result Date: 08/27/2021 CLINICAL DATA:  Small-bowel obstruction with left lower quadrant pain. Leukocytosis also. Metastatic colon cancer. Ongoing chemotherapy. EXAM: CT ABDOMEN AND PELVIS WITHOUT CONTRAST TECHNIQUE: Multidetector CT imaging of the abdomen and pelvis was performed following the standard protocol without IV contrast. COMPARISON:  The recent CT with IV contrast 08/25/2021, CT abdomen and pelvis with IV and oral contrast 07/01/2021. FINDINGS: Lower chest: There is slight increased prominence of a few scattered epipericardial lymph nodes compared with 07/01/2021. No acute lung base findings. Hepatobiliary: No focal liver abnormality is seen. No gallstones, gallbladder wall thickening, or biliary dilatation. Pancreas: Unremarkable. No pancreatic ductal dilatation or surrounding inflammatory changes. Spleen: Unremarkable without contrast. Adrenals/Urinary Tract: No focal abnormality in the adrenal glands and renal cortex. No urinary stones or obstruction. Chronic perinephric stranding is similar. Normal bladder thickness. Stomach/Bowel: There is increasing dilatation of left upper to mid abdominal small bowel and anterior central small bowel up to 5 cm, although there is still relative decompressed appearance in the stomach and duodenum. The dilatation begins to be seen distal to the ligament of Treitz. The exact transition point could not be found but I suspect is probably related to abdominal wall adhesive disease with multiple small bowel segments closely abutting the anterior wall on multiple slices. The transitional segment is probably in the right mid to lower abdomen anteriorly. The appendix surgically absent. Rest of the small bowel is decompressed, some of it apparently having been removed.  The large intestine wall is unremarkable except for uncomplicated diverticula along the left segments. Vascular/Lymphatic: There is mild aortoiliac calcific plaque. No enlarged lymph nodes are seen. There is a stable subcentimeter to borderline sized ileocolic mesenteric lymph node in the right lower abdomen on axial 50. Reproductive: Normal prostate. Other: Diffuse haziness in omentum extends to the subphrenic space on the left. There is increased minimal ascites in the perihepatic space and right anterior abdominal mesenteric folds. There are small inguinal fat hernias. Musculoskeletal: Chronic L5 pars defects and grade 1 L5 spondylolisthesis. No destructive bone lesions. 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 IMPRESSION: 1. Intermediate to high-grade obstruction to approximately the mid small bowel, etiology is probably adhesive disease and the transition is most likely in the anterior right mid to lower abdomen along the abdominal wall. The exact transitional segment could not be located without contrast. 2. Slight increased prominence of a few epipericardial lymph nodes. Small stable ileocolic mesenteric node. 3. Diffuse omental haziness extending into the left subphrenic space concerning for metastatic disease, with small volume of ascites. No free air. 4. Aortic atherosclerosis.  Additional findings as above. Electronically Signed   By: Telford Nab M.D.   On: 08/27/2021 04:19   DG ABD ACUTE 2+V W 1V CHEST  Result Date: 08/27/2021 CLINICAL DATA:  Metastatic colon cancer. Abdominal pain left lower quadrant with vomiting. EXAM: DG ABDOMEN ACUTE  WITH 1 VIEW CHEST COMPARISON:  CT with IV contrast 08/25/2021 FINDINGS: There is increased dilatation of left mid to lower abdominal small bowel up to 5 cm concerning for small bowel obstruction. Small amount of scattered gas and stool remain present in the colon. There are stable visceral shadows. There is no evidence of free air. No  pathologic calcifications. The lungs are generally clear aside from perihilar linear atelectatic changes on the left. No pleural effusion is seen. The cardiac size is normal. Right IJ port catheter tip remains at the cavoatrial junction. IMPRESSION: Increased dilatation of the left abdominal small bowel up to 5 cm concerning for small bowel obstruction. In all other respects no further changes. Electronically Signed   By: Telford Nab M.D.   On: 08/27/2021 02:38   DG Abd Portable 1V-Small Bowel Obstruction Protocol-initial, 8 hr delay  Result Date: 08/27/2021 CLINICAL DATA:  Small bowel obstruction.  History of colon cancer. EXAM: PORTABLE ABDOMEN - 1 VIEW COMPARISON:  Same day. FINDINGS: Residual contrast is seen through the nondilated colon. Mildly dilated small bowel loops are noted in the upper abdomen concerning for ileus or distal small bowel obstruction. IMPRESSION: Mildly dilated small bowel loops are again noted in upper abdomen concerning for distal small bowel obstruction. Residual contrast is noted in nondilated colon. Electronically Signed   By: Marijo Conception M.D.   On: 08/27/2021 21:41     Labs:   Basic Metabolic Panel: Recent Labs  Lab 08/25/21 0522 08/27/21 0126 08/27/21 0623 08/28/21 0307 08/29/21 0414  NA 133* 135 136 135 135  K 3.7 3.8 3.9 3.8 3.7  CL 103 104 102 104 101  CO2 22 21* 25 24 25   GLUCOSE 106* 112* 107* 83 104*  BUN 15 15 13 11 7   CREATININE 1.18 1.23 1.08 1.30* 1.00  CALCIUM 8.8* 9.2 8.7* 8.2* 8.7*  MG  --   --   --  1.9  --    GFR Estimated Creatinine Clearance: 107.8 mL/[name] (by C-G formula based on SCr of 1 mg/dL). Liver Function Tests: Recent Labs  Lab 08/25/21 0522 08/27/21 0126  AST 19 18  ALT 31 26  ALKPHOS 161* 152*  BILITOT 0.5 0.7  PROT 7.6 8.0  ALBUMIN 3.8 3.9   Recent Labs  Lab 08/25/21 0522 08/27/21 0126  LIPASE 60* 34   No results for input(s): AMMONIA in the last 168 hours. Coagulation profile No results for  input(s): INR, PROTIME in the last 168 hours.  CBC: Recent Labs  Lab 08/25/21 0522 08/27/21 0126 08/27/21 0623 08/28/21 0307 08/29/21 0414  WBC 20.9* 19.3* 15.7* 13.7* 16.1*  NEUTROABS 18.2* 17.2*  --   --   --   HGB 14.2 14.8 13.2 12.5* 13.1  HCT 42.6 43.2 39.0 38.1* 39.0  MCV 90.3 88.3 90.7 92.0 88.8  PLT 214 221 202 203 224   Cardiac Enzymes: No results for input(s): CKTOTAL, CKMB, CKMBINDEX, TROPONINI in the last 168 hours. BNP: Invalid input(s): POCBNP CBG: No results for input(s): GLUCAP in the last 168 hours. D-Dimer No results for input(s): DDIMER in the last 72 hours. Hgb A1c No results for input(s): HGBA1C in the last 72 hours. Lipid Profile No results for input(s): CHOL, HDL, LDLCALC, TRIG, CHOLHDL, LDLDIRECT in the last 72 hours. Thyroid function studies No results for input(s): TSH, T4TOTAL, T3FREE, THYROIDAB in the last 72 hours.  Invalid input(s): FREET3 Anemia work up No results for input(s): VITAMINB12, FOLATE, FERRITIN, TIBC, IRON, RETICCTPCT in the last 72 hours.  Microbiology Recent Results (from the past 240 hour(s))  Resp Panel by RT-PCR (Flu A&B, Covid) Nasopharyngeal Swab     Status: None   Collection Time: 08/27/21  3:00 AM   Specimen: Nasopharyngeal Swab; Nasopharyngeal(NP) swabs in vial transport medium  Result Value Ref Range Status   SARS Coronavirus 2 by RT PCR NEGATIVE NEGATIVE Final    Comment: (NOTE) SARS-CoV-2 target nucleic acids are NOT DETECTED.  The SARS-CoV-2 RNA is generally detectable in upper respiratory specimens during the acute phase of infection. The lowest concentration of SARS-CoV-2 viral copies this assay can detect is 138 copies/mL. A negative result does not preclude SARS-Cov-2 infection and should not be used as the sole basis for treatment or other patient management decisions. A negative result may occur with  improper specimen collection/handling, submission of specimen other than nasopharyngeal swab, presence  of viral mutation(s) within the areas targeted by this assay, and inadequate number of viral copies(<138 copies/mL). A negative result must be combined with clinical observations, patient history, and epidemiological information. The expected result is Negative.  Fact Sheet for Patients:  EntrepreneurPulse.com.au  Fact Sheet for Healthcare Providers:  IncredibleEmployment.be  This test is no t yet approved or cleared by the [place] FDA and  has been authorized for detection and/or diagnosis of SARS-CoV-2 by FDA under an Emergency Use Authorization (EUA). This EUA will remain  in effect (meaning this test can be used) for the duration of the COVID-19 declaration under Section 564(b)(1) of the Act, 21 U.S.C.section 360bbb-3(b)(1), unless the authorization is terminated  or revoked sooner.       Influenza A by PCR NEGATIVE NEGATIVE Final   Influenza B by PCR NEGATIVE NEGATIVE Final    Comment: (NOTE) The Xpert Xpress SARS-CoV-2/FLU/RSV plus assay is intended as an aid in the diagnosis of influenza from Nasopharyngeal swab specimens and should not be used as a sole basis for treatment. Nasal washings and aspirates are unacceptable for Xpert Xpress SARS-CoV-2/FLU/RSV testing.  Fact Sheet for Patients: EntrepreneurPulse.com.au  Fact Sheet for Healthcare Providers: IncredibleEmployment.be  This test is not yet approved or cleared by the [place] FDA and has been authorized for detection and/or diagnosis of SARS-CoV-2 by FDA under an Emergency Use Authorization (EUA). This EUA will remain in effect (meaning this test can be used) for the duration of the COVID-19 declaration under Section 564(b)(1) of the Act, 21 U.S.C. section 360bbb-3(b)(1), unless the authorization is terminated or revoked.  Performed at [facility], [place] [address], [place], [place] [address]       Discharge Instructions:   Discharge Instructions     Call MD for:  persistant nausea and vomiting   Complete by: As directed    Call MD for:  severe uncontrolled pain   Complete by: As directed    Call MD for:  temperature >100.4   Complete by: As directed    Diet general   Complete by: As directed    Discharge instructions   Complete by: As directed    Follow-up with your primary care physician in 1 week.  Seek medical attention for worsening symptoms.  Follow-up with your oncologist as scheduled on Jan 5th   Increase activity slowly   Complete by: As directed       Allergies as of 08/29/2021       Reactions   Oxaliplatin Hives, Other (See Comments)   15 minutes into infusion, patient started to complain of feeling warm and states " it feels like the  last time I had my reaction"        Medication List     STOP taking these medications    diphenoxylate-atropine 2.5-0.025 MG tablet Commonly known as: LOMOTIL       TAKE these medications    acetaminophen 500 MG tablet Commonly known as: TYLENOL Take 500-1,000 mg by mouth every 6 (six) hours as needed for mild pain.   ALPRAZolam 0.25 MG tablet Commonly known as: XANAX Take 1-2 tablets (0.25-0.5 mg total) by mouth at bedtime as needed for anxiety or sleep.   B COMPLEX VITAMINS SL Place 1 dropperful under the tongue daily   dicyclomine 10 MG capsule Commonly known as: BENTYL TAKE 1 CAPSULE (10 MG TOTAL) BY MOUTH 4 (FOUR) TIMES DAILY - BEFORE MEALS AND AT BEDTIME. What changed:  when to take this reasons to take this   hydrocortisone 2.5 % rectal cream Commonly known as: Anusol-HC Place 1 application rectally 2 (two) times daily.   ondansetron 8 MG tablet Commonly known as: ZOFRAN TAKE 1 TABLET BY MOUTH 2TIMES DAILY AS NEEDED FOR NAUSEA/VOMITING. START ON DAY3 AFTER CHEMOTHERAPY.   oxyCODONE 5 MG immediate release tablet Commonly known as: Oxy IR/ROXICODONE Take 1 tablet (5 mg total) by mouth  every 8 (eight) hours as needed for severe pain. What changed: how much to take   pantoprazole 40 MG tablet Commonly known as: Protonix Take 1 tablet (40 mg total) by mouth daily.   polyethylene glycol 17 g packet Commonly known as: MIRALAX / GLYCOLAX Take 17 g by mouth daily as needed for mild constipation.   prochlorperazine 10 MG tablet Commonly known as: COMPAZINE Take 1 tablet (10 mg total) by mouth every 6 (six) hours as needed for nausea or vomiting.        Follow-up Information     [name], MD Follow up in 1 week(s).   Specialty: Internal Medicine Contact information: [place] [address] [phone]                  Time coordinating discharge: 39 minutes  Signed:  Mckinzie Saksa  [facility] 08/29/2021, 9:48 AM

## 2021-08-29 NOTE — Plan of Care (Signed)
Pt ready to DC home with wife. PAC deaccessed by IV team.

## 2021-09-02 ENCOUNTER — Other Ambulatory Visit: Payer: Self-pay

## 2021-09-02 ENCOUNTER — Inpatient Hospital Stay: Payer: BC Managed Care – PPO | Admitting: Hematology

## 2021-09-02 ENCOUNTER — Encounter (HOSPITAL_COMMUNITY): Payer: Self-pay | Admitting: Emergency Medicine

## 2021-09-02 ENCOUNTER — Emergency Department (HOSPITAL_COMMUNITY): Payer: BC Managed Care – PPO

## 2021-09-02 ENCOUNTER — Inpatient Hospital Stay (HOSPITAL_COMMUNITY)
Admission: EM | Admit: 2021-09-02 | Discharge: 2021-09-13 | DRG: 375 | Disposition: A | Discharge status: Home Health | Payer: BC Managed Care – PPO | Attending: Internal Medicine | Admitting: Internal Medicine

## 2021-09-02 ENCOUNTER — Inpatient Hospital Stay: Payer: BC Managed Care – PPO

## 2021-09-02 DIAGNOSIS — K219 Gastro-esophageal reflux disease without esophagitis: Secondary | ICD-10-CM | POA: Diagnosis present

## 2021-09-02 DIAGNOSIS — Z803 Family history of malignant neoplasm of breast: Secondary | ICD-10-CM

## 2021-09-02 DIAGNOSIS — C189 Malignant neoplasm of colon, unspecified: Secondary | ICD-10-CM | POA: Diagnosis present

## 2021-09-02 DIAGNOSIS — Z515 Encounter for palliative care: Secondary | ICD-10-CM | POA: Diagnosis not present

## 2021-09-02 DIAGNOSIS — C772 Secondary and unspecified malignant neoplasm of intra-abdominal lymph nodes: Secondary | ICD-10-CM | POA: Diagnosis present

## 2021-09-02 DIAGNOSIS — J910 Malignant pleural effusion: Secondary | ICD-10-CM | POA: Diagnosis present

## 2021-09-02 DIAGNOSIS — K56609 Unspecified intestinal obstruction, unspecified as to partial versus complete obstruction: Secondary | ICD-10-CM | POA: Diagnosis present

## 2021-09-02 DIAGNOSIS — E860 Dehydration: Secondary | ICD-10-CM | POA: Diagnosis present

## 2021-09-02 DIAGNOSIS — Z7189 Other specified counseling: Secondary | ICD-10-CM | POA: Diagnosis not present

## 2021-09-02 DIAGNOSIS — D72829 Elevated white blood cell count, unspecified: Secondary | ICD-10-CM | POA: Diagnosis present

## 2021-09-02 DIAGNOSIS — N179 Acute kidney failure, unspecified: Secondary | ICD-10-CM | POA: Diagnosis present

## 2021-09-02 DIAGNOSIS — R112 Nausea with vomiting, unspecified: Secondary | ICD-10-CM | POA: Diagnosis present

## 2021-09-02 DIAGNOSIS — Z800 Family history of malignant neoplasm of digestive organs: Secondary | ICD-10-CM | POA: Diagnosis not present

## 2021-09-02 DIAGNOSIS — F411 Generalized anxiety disorder: Secondary | ICD-10-CM | POA: Diagnosis present

## 2021-09-02 DIAGNOSIS — Z6841 Body Mass Index (BMI) 40.0 and over, adult: Secondary | ICD-10-CM | POA: Diagnosis not present

## 2021-09-02 DIAGNOSIS — Z8042 Family history of malignant neoplasm of prostate: Secondary | ICD-10-CM

## 2021-09-02 DIAGNOSIS — E669 Obesity, unspecified: Secondary | ICD-10-CM

## 2021-09-02 DIAGNOSIS — E876 Hypokalemia: Secondary | ICD-10-CM | POA: Diagnosis present

## 2021-09-02 DIAGNOSIS — C786 Secondary malignant neoplasm of retroperitoneum and peritoneum: Principal | ICD-10-CM | POA: Diagnosis present

## 2021-09-02 DIAGNOSIS — R1084 Generalized abdominal pain: Secondary | ICD-10-CM | POA: Diagnosis not present

## 2021-09-02 DIAGNOSIS — Z20822 Contact with and (suspected) exposure to covid-19: Secondary | ICD-10-CM | POA: Diagnosis present

## 2021-09-02 DIAGNOSIS — R109 Unspecified abdominal pain: Secondary | ICD-10-CM | POA: Diagnosis present

## 2021-09-02 DIAGNOSIS — J90 Pleural effusion, not elsewhere classified: Secondary | ICD-10-CM

## 2021-09-02 DIAGNOSIS — Z83438 Family history of other disorder of lipoprotein metabolism and other lipidemia: Secondary | ICD-10-CM

## 2021-09-02 LAB — CBC WITH DIFFERENTIAL/PLATELET
Abs Immature Granulocytes: 0.12 10*3/uL — ABNORMAL HIGH (ref 0.00–0.07)
Basophils Absolute: 0.1 10*3/uL (ref 0.0–0.1)
Basophils Relative: 0 %
Eosinophils Absolute: 0 10*3/uL (ref 0.0–0.5)
Eosinophils Relative: 0 %
HCT: 43.7 % (ref 39.0–52.0)
Hemoglobin: 14.5 g/dL (ref 13.0–17.0)
Immature Granulocytes: 1 %
Lymphocytes Relative: 8 %
Lymphs Abs: 1.3 10*3/uL (ref 0.7–4.0)
MCH: 29.7 pg (ref 26.0–34.0)
MCHC: 33.2 g/dL (ref 30.0–36.0)
MCV: 89.5 fL (ref 80.0–100.0)
Monocytes Absolute: 1.5 10*3/uL — ABNORMAL HIGH (ref 0.1–1.0)
Monocytes Relative: 9 %
Neutro Abs: 13.7 10*3/uL — ABNORMAL HIGH (ref 1.7–7.7)
Neutrophils Relative %: 82 %
Platelets: 338 10*3/uL (ref 150–400)
RBC: 4.88 MIL/uL (ref 4.22–5.81)
RDW: 15.2 % (ref 11.5–15.5)
WBC: 16.7 10*3/uL — ABNORMAL HIGH (ref 4.0–10.5)
nRBC: 0 % (ref 0.0–0.2)

## 2021-09-02 LAB — COMPREHENSIVE METABOLIC PANEL
ALT: 18 U/L (ref 0–44)
ALT: 42 U/L (ref 0–44)
AST: 13 U/L — ABNORMAL LOW (ref 15–41)
AST: 29 U/L (ref 15–41)
Albumin: <1.5 g/dL — ABNORMAL LOW (ref 3.5–5.0)
Albumin: 3.5 g/dL (ref 3.5–5.0)
Alkaline Phosphatase: 42 U/L (ref 38–126)
Alkaline Phosphatase: 101 U/L (ref 38–126)
Anion gap: 2 — ABNORMAL LOW (ref 5–15)
Anion gap: 9 (ref 5–15)
BUN: 11 mg/dL (ref 6–20)
BUN: 20 mg/dL (ref 6–20)
CO2: 12 mmol/L — ABNORMAL LOW (ref 22–32)
CO2: 23 mmol/L (ref 22–32)
Calcium: <4 mg/dL — CL (ref 8.9–10.3)
Calcium: 9 mg/dL (ref 8.9–10.3)
Chloride: 129 mmol/L — ABNORMAL HIGH (ref 98–111)
Chloride: 101 mmol/L (ref 98–111)
Creatinine, Ser: 0.46 mg/dL — ABNORMAL LOW (ref 0.61–1.24)
Creatinine, Ser: 1.29 mg/dL — ABNORMAL HIGH (ref 0.61–1.24)
GFR, Estimated: >60 mL/min (ref >60)
GFR, Estimated: >60 mL/min (ref >60)
Glucose, Bld: 53 mg/dL — ABNORMAL LOW (ref 70–99)
Glucose, Bld: 100 mg/dL — ABNORMAL HIGH (ref 70–99)
Potassium: <2 mmol/L — CL (ref 3.5–5.1)
Potassium: 4.1 mmol/L (ref 3.5–5.1)
Sodium: 143 mmol/L (ref 135–145)
Sodium: 133 mmol/L — ABNORMAL LOW (ref 135–145)
Total Bilirubin: 0.4 mg/dL (ref 0.3–1.2)
Total Bilirubin: 0.8 mg/dL (ref 0.3–1.2)
Total Protein: <3 g/dL — ABNORMAL LOW (ref 6.5–8.1)
Total Protein: 7.5 g/dL (ref 6.5–8.1)

## 2021-09-02 LAB — TROPONIN I (HIGH SENSITIVITY)
Troponin I (High Sensitivity): 3 ng/L (ref <18)
Troponin I (High Sensitivity): 4 ng/L (ref <18)

## 2021-09-02 LAB — LIPASE, BLOOD: Lipase: 24 U/L (ref 11–51)

## 2021-09-02 LAB — RESP PANEL BY RT-PCR (FLU A&B, COVID) ARPGX2
Influenza A by PCR: NEGATIVE
Influenza B by PCR: NEGATIVE
SARS Coronavirus 2 by RT PCR: NEGATIVE

## 2021-09-02 LAB — MAGNESIUM: Magnesium: 1.9 mg/dL (ref 1.7–2.4)

## 2021-09-02 MED ORDER — LACTATED RINGERS IV SOLN: INTRAVENOUS | Status: AC

## 2021-09-02 MED ORDER — HYDROMORPHONE HCL 1 MG/ML IJ SOLN
1.0000 mg | Freq: Once | INTRAMUSCULAR | Status: AC
  Administered 2021-09-02: 22:00:00 1 mg via INTRAVENOUS
  Filled 2021-09-02: qty 1

## 2021-09-02 MED ORDER — ACETAMINOPHEN 325 MG PO TABS: 650.0000 mg | ORAL_TABLET | Freq: Four times a day (QID) | ORAL | Status: DC | PRN

## 2021-09-02 MED ORDER — MORPHINE SULFATE (PF) 4 MG/ML IV SOLN
4.0000 mg | Freq: Once | INTRAVENOUS | Status: AC
  Administered 2021-09-02: 19:00:00 4 mg via INTRAVENOUS
  Filled 2021-09-02: qty 1

## 2021-09-02 MED ORDER — MIDAZOLAM HCL 2 MG/2ML IJ SOLN
1.0000 mg | Freq: Once | INTRAMUSCULAR | Status: AC
  Administered 2021-09-02: 22:00:00 1 mg via INTRAVENOUS
  Filled 2021-09-02: qty 2

## 2021-09-02 MED ORDER — ONDANSETRON HCL 4 MG/2ML IJ SOLN
4.0000 mg | Freq: Four times a day (QID) | INTRAMUSCULAR | Status: DC | PRN
  Administered 2021-09-06 – 2021-09-13 (×9): 4 mg via INTRAVENOUS
  Filled 2021-09-02 (×9): qty 2

## 2021-09-02 MED ORDER — LACTATED RINGERS IV BOLUS
1000.0000 mL | Freq: Once | INTRAVENOUS | Status: AC
  Administered 2021-09-02: 19:00:00 1000 mL via INTRAVENOUS

## 2021-09-02 MED ORDER — HYDROMORPHONE HCL 1 MG/ML IJ SOLN
1.0000 mg | Freq: Once | INTRAMUSCULAR | Status: AC
  Administered 2021-09-02: 21:00:00 1 mg via INTRAVENOUS
  Filled 2021-09-02: qty 1

## 2021-09-02 MED ORDER — NALOXONE HCL 0.4 MG/ML IJ SOLN: 0.4000 mg | INTRAMUSCULAR | Status: DC | PRN

## 2021-09-02 MED ORDER — ACETAMINOPHEN 650 MG RE SUPP: 650.0000 mg | Freq: Four times a day (QID) | RECTAL | Status: DC | PRN

## 2021-09-02 MED ORDER — HYDROMORPHONE HCL 1 MG/ML IJ SOLN
0.5000 mg | INTRAMUSCULAR | Status: DC | PRN
  Administered 2021-09-03 – 2021-09-07 (×25): 0.5 mg via INTRAVENOUS
  Filled 2021-09-02 (×10): qty 0.5
  Filled 2021-09-02 (×2): qty 1
  Filled 2021-09-02 (×7): qty 0.5
  Filled 2021-09-02: qty 1
  Filled 2021-09-02 (×5): qty 0.5

## 2021-09-02 MED ORDER — ONDANSETRON HCL 4 MG/2ML IJ SOLN
4.0000 mg | Freq: Once | INTRAMUSCULAR | Status: AC
  Administered 2021-09-02: 19:00:00 4 mg via INTRAVENOUS
  Filled 2021-09-02: qty 2

## 2021-09-02 MED ORDER — SODIUM CHLORIDE 0.9 % IV BOLUS: 1000.0000 mL | Freq: Once | INTRAVENOUS | Status: DC

## 2021-09-02 MED ORDER — IOHEXOL 350 MG/ML SOLN
80.0000 mL | Freq: Once | INTRAVENOUS | Status: AC | PRN
  Administered 2021-09-02: 20:00:00 80 mL via INTRAVENOUS

## 2021-09-02 NOTE — ED Notes (Signed)
Pt resting comfortably at this time, wife at bedside

## 2021-09-02 NOTE — H&P (Signed)
History and Physical    PLEASE NOTE THAT DRAGON DICTATION SOFTWARE WAS USED IN THE CONSTRUCTION OF THIS NOTE.   Ricardo [name] [id number]:[id number] DOB: 08/01/1966 DOA: 09/02/2021  PCP: [name], MD  Patient coming from: home   I have personally briefly reviewed patient's old medical records in [place]  Chief Complaint: Abdominal pain  HPI: Ricardo [name] is a 55 y.o. 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   The patient was recently hospitalized at [facility] from 08/28/2019 to 08/29/2021 for partial small bowel obstruction, subsequently improved with conservative measures, following which had a bowel movement prior to discharge to home on 08/29/2021.  However, over the last 2 to 3 days, he has noted progressive generalized abdominal discomfort, which he describes as sharp to crampy pain, along with worsening abdominal distention over that time.  Pain has been constant over the last day, worsening with palpation of the abdomen as well as any attempts to eat or drink anything.  This is been associated with nausea/vomiting, with the patient noting that he vomits within a few seconds to a minute and attempting to eat or drink anything over the last 1 to 2 days.  Emesis associated with nonbloody findings, no coffee-ground appearance.  Most recent s bowel movement occurred on 09/02/2021, and he has subsequently noted diminished production of flatus.  Reports that this constellation of symptoms are very similar to that which she is experiencing in the days leading up to this most recent previous hospitalization for small bowel obstruction.  Denies any associated subjective fever,  chills, rigors, generalized myalgias.  Denies any associated chest pain, palpitations, diaphoresis.  No recent trauma.  In the setting history of metastatic colon cancer, he follows with Dr. Burr Medico of Heme-onc.    ED Course:  Vital signs in the ED were notable for the following: Temperature max 98.0; heart rate initially 126, which decreased 22 following interval administration of IV fluids, blood pressure 112/94 -138/92; respiratory rate 17-20, oxygen saturation 94 to 98% on room air.  Labs were notable for the following: CMP notable for the following: Bicarbonate 23, creatinine 1.29 compared to most recent prior creatinine data point of 1.0 on 08/29/2021, liver enzymes within normal limits.  High-sensitivity troponin I.  Before.  CBC notable for the following: White blood cell count 16,500 compared to 16,700 on 08/29/2021, platelet count 273.  COVID-19/[place] PCR found to be negative.  Imaging and additional notable ED work-up: CT abdomen/pelvis with contrast suggestive of small bowel obstruction, with transition point likely in the mid abdomen, where there are clustered small bowel loops adhered to the anterior abdominal wall, with air-fluid level noted in the stomach.  EDP discussed the patient's case with the on-call general surgeon, Dr. Redmond Pulling, who recommended admission to the hospital service for further evaluation management of small bowel obstruction, including initiation of conservative measures along with NG tube, with plan for Dr. Redmond Pulling to formally consult and see the patient in the morning.   NG tube placed in the ED, and subsequently attached to low intermittent wall suction.       Review of Systems: As per HPI otherwise 10 point review of systems negative.   Past Medical History:  Diagnosis Date  Arthritis    Colon cancer ([place])    with metastasis   Family history of adverse reaction to anesthesia    mother had problem with it due to her asthma   Family history of breast  cancer    Family history of prostate cancer     Past Surgical History:  Procedure Laterality Date   BOWEL RESECTION N/A 11/29/2020   Procedure: PARTIAL BOWEL RESECTION;  Surgeon: Jovita Kussmaul, MD;  Location: [place];  Service: General;  Laterality: N/A;   GASTROSTOMY Left 11/29/2020   Procedure: INSERTION OF GASTROSTOMY TUBE;  Surgeon: Jovita Kussmaul, MD;  Location: [place];  Service: General;  Laterality: Left;   IR IMAGING GUIDED PORT INSERTION  12/12/2020   LAPAROSCOPIC APPENDECTOMY N/A 01/17/2019   Procedure: APPENDECTOMY LAPAROSCOPIC;  Surgeon: Ralene Ok, MD;  Location: [place];  Service: General;  Laterality: N/A;   LAPAROTOMY N/A 11/29/2020   Procedure: EXPLORATORY LAPAROTOMY;  Surgeon: Jovita Kussmaul, MD;  Location: [place];  Service: General;  Laterality: N/A;  PUT CASE IN ROOM 1 STARTING AT 9:30AM FOR 120 MIN   OSTOMY N/A 11/29/2020   Procedure: POSSIBLE OSTOMY CREATION;  Surgeon: Jovita Kussmaul, MD;  Location: [place];  Service: General;  Laterality: N/A;   RECTAL BIOPSY N/A 11/29/2020   Procedure: PERITIONEAL BIOPSY;  Surgeon: Jovita Kussmaul, MD;  Location: [place];  Service: General;  Laterality: N/A;   SHOULDER SURGERY Left 09/19/2015    Social History:  reports that he has never smoked. He has never used smokeless tobacco. He reports that he does not currently use alcohol. He reports that he does not use drugs.   Allergies  Allergen Reactions   Oxaliplatin Hives and Other (See Comments)    15 minutes into infusion, patient started to complain of feeling warm and states " it feels like the last time I had my reaction"    Family History  Problem Relation Age of Onset   Cancer Sister 60       breast cancer   Cancer Brother 74       prostate cancer    High Cholesterol Brother    Pancreatic cancer Maternal Uncle    Bone cancer Cousin        pat first cousin   Colon cancer Neg Hx    Liver disease Neg Hx    Esophageal cancer Neg Hx    Stomach cancer Neg Hx     Family  history reviewed and not pertinent    Prior to Admission medications   Medication Sig Start Date End Date Taking? Authorizing Provider  acetaminophen (TYLENOL) 500 MG tablet Take 500-1,000 mg by mouth every 6 (six) hours as needed for mild pain.    [provider]  ALPRAZolam Duanne Moron) 0.25 MG tablet Take 1-2 tablets (0.25-0.5 mg total) by mouth at bedtime as needed for anxiety or sleep. 07/17/21   [name], MD  B COMPLEX VITAMINS SL Place 1 dropperful under the tongue daily    [provider]  dicyclomine (BENTYL) 10 MG capsule TAKE 1 CAPSULE (10 MG TOTAL) BY MOUTH 4 (FOUR) TIMES DAILY - BEFORE MEALS AND AT BEDTIME. Patient taking differently: Take 10 mg by mouth daily as needed (for constipation). 04/18/21   Alla Feeling, NP  hydrocortisone (ANUSOL-HC) 2.5 % rectal cream Place 1 application rectally 2 (two) times daily. Patient not taking: Reported on 08/27/2021 03/27/21   Alla Feeling, NP  ondansetron (ZOFRAN) 8 MG tablet TAKE 1 TABLET  BY MOUTH 2TIMES DAILY AS NEEDED FOR NAUSEA/VOMITING. START ON DAY3 AFTER CHEMOTHERAPY. Patient not taking: Reported on 08/27/2021 01/16/21   [provider]  oxyCODONE (OXY IR/ROXICODONE) 5 MG immediate release tablet Take 1 tablet (5 mg total) by mouth every 8 (eight) hours as needed for severe pain. Patient taking differently: Take 5-10 mg by mouth every 8 (eight) hours as needed for severe pain. 07/17/21   [name], MD  pantoprazole (PROTONIX) 40 MG tablet Take 1 tablet (40 mg total) by mouth daily. 08/29/21 08/29/22  Pokhrel, Corrie Mckusick, MD  polyethylene glycol (MIRALAX / GLYCOLAX) 17 g packet Take 17 g by mouth daily as needed for mild constipation.    [provider]  prochlorperazine (COMPAZINE) 10 MG tablet Take 1 tablet (10 mg total) by mouth every 6 (six) hours as needed for nausea or vomiting. 01/16/21   [name], MD     Objective    Physical Exam: Vitals:   09/02/21 1948 09/02/21 2012 09/02/21 2030 09/02/21 2139   BP: (!) 132/99 (!) 138/92 (!) 148/113 (!) 143/96  Pulse: (!) 107 (!) 105 (!) 104 (!) 109  Resp: 18 20 16 18   Temp:      TempSrc:      SpO2: (!) 22% 98% 94% 94%  Weight:      Height:        General: appears to be stated age; alert, oriented; NGT noted Skin: warm, dry, no rash Head:  AT/Regino Ramirez Mouth:  Oral mucosa membranes appear dry, normal dentition Neck: supple; trachea midline Heart:  RRR; did not appreciate any M/R/G Lungs: CTAB, did not appreciate any wheezes, rales, or rhonchi Abdomen: + BS; soft, mildly distended, diffuse tenderness, but in the absence of any guarding, rigidity, or rebound tenderness Vascular: 2+ pedal pulses b/l; 2+ radial pulses b/l Extremities: no peripheral edema, no muscle wasting Neuro: strength and sensation intact in upper and lower extremities b/l     Labs on Admission: I have personally reviewed following labs and imaging studies  CBC: Recent Labs  Lab 08/27/21 0126 08/27/21 0623 08/28/21 0307 08/29/21 0414 09/02/21 1741  WBC 19.3* 15.7* 13.7* 16.1* 16.7*  NEUTROABS 17.2*  --   --   --  13.7*  HGB 14.8 13.2 12.5* 13.1 14.5  HCT 43.2 39.0 38.1* 39.0 43.7  MCV 88.3 90.7 92.0 88.8 89.5  PLT 221 202 203 224 213   Basic Metabolic Panel: Recent Labs  Lab 08/27/21 0623 08/28/21 0307 08/29/21 0414 09/02/21 1741 09/02/21 2035  NA 136 135 135 143 133*  K 3.9 3.8 3.7 <2.0* 4.1  CL 102 104 101 129* 101  CO2 25 24 25  12* 23  GLUCOSE 107* 83 104* 53* 100*  BUN 13 11 7 11 20   CREATININE 1.08 1.30* 1.00 0.46* 1.29*  CALCIUM 8.7* 8.2* 8.7* <4.0* 9.0  MG  --  1.9  --   --  1.9   GFR: Estimated Creatinine Clearance: 77.3 mL/min (A) (by C-G formula based on SCr of 1.29 mg/dL (H)). Liver Function Tests: Recent Labs  Lab 08/27/21 0126 09/02/21 1741 09/02/21 2035  AST 18 13* 29  ALT 26 18 42  ALKPHOS 152* 42 101  BILITOT 0.7 0.4 0.8  PROT 8.0 <3.0* 7.5  ALBUMIN 3.9 <1.5* 3.5   Recent Labs  Lab 08/27/21 0126 09/02/21 1741  LIPASE  34 24   No results for input(s): AMMONIA in the last 168 hours. Coagulation Profile: No results for input(s): INR, PROTIME in the last 168 hours. Cardiac Enzymes:  No results for input(s): CKTOTAL, CKMB, CKMBINDEX, TROPONINI in the last 168 hours. BNP (last 3 results) No results for input(s): PROBNP in the last 8760 hours. HbA1C: No results for input(s): HGBA1C in the last 72 hours. CBG: No results for input(s): GLUCAP in the last 168 hours. Lipid Profile: No results for input(s): CHOL, HDL, LDLCALC, TRIG, CHOLHDL, LDLDIRECT in the last 72 hours. Thyroid Function Tests: No results for input(s): TSH, T4TOTAL, FREET4, T3FREE, THYROIDAB in the last 72 hours. Anemia Panel: No results for input(s): VITAMINB12, FOLATE, FERRITIN, TIBC, IRON, RETICCTPCT in the last 72 hours. Urine analysis:    Component Value Date/Time   COLORURINE YELLOW 08/27/2021 1220   APPEARANCEUR HAZY (A) 08/27/2021 1220   LABSPEC 1.027 08/27/2021 1220   PHURINE 5.0 08/27/2021 1220   GLUCOSEU NEGATIVE 08/27/2021 1220   HGBUR NEGATIVE 08/27/2021 [place] 08/27/2021 1220   KETONESUR NEGATIVE 08/27/2021 1220   PROTEINUR NEGATIVE 08/27/2021 1220   NITRITE NEGATIVE 08/27/2021 1220   LEUKOCYTESUR NEGATIVE 08/27/2021 1220    Radiological Exams on Admission: CT ABDOMEN PELVIS W CONTRAST  Result Date: 09/02/2021 CLINICAL DATA:  Bowel obstruction suspected. Abdominal pain. Chemotherapy. Three weeks ago. EXAM: CT ABDOMEN AND PELVIS WITH CONTRAST TECHNIQUE: Multidetector CT imaging of the abdomen and pelvis was performed using the standard protocol following bolus administration of intravenous contrast. CONTRAST:  64mL OMNIPAQUE IOHEXOL 350 MG/ML SOLN COMPARISON:  CT abdomen and pelvis 08/27/2021. FINDINGS: Lower chest: There is a new trace left pleural effusion. There is atelectasis in the bilateral lung bases. Hepatobiliary: No focal liver abnormality is seen. No gallstones, gallbladder wall thickening,  or biliary dilatation. Pancreas: Unremarkable. No pancreatic ductal dilatation or surrounding inflammatory changes. Spleen: Normal in size without focal abnormality. Adrenals/Urinary Tract: Adrenal glands are unremarkable. Kidneys are normal, without renal calculi, focal lesion, or hydronephrosis. Bladder is unremarkable. Stomach/Bowel: There are dilated mid and proximal small bowel loops with air-fluid levels measuring up to 5 cm. Degree of distention has mildly increased. There is also new moderate air-fluid level within the stomach. Transition point is likely in the mid abdomen where there are multiple small bowel loops demonstrating adhesive disease to the anterior abdominal wall. This finding is unchanged. The colon is nondilated. The appendix is not seen. There is no pneumatosis or free air identified. Vascular/Lymphatic: Aortic atherosclerosis. No enlarged abdominal or pelvic lymph nodes. Nonenlarged cardiophrenic and ileocecal lymph nodes are unchanged. Reproductive: Prostate is unremarkable. Other: There are small fat containing bilateral inguinal hernias. There is a small amount of free fluid in the anterior right abdomen which has mildly increased. There is mesenteric/omental haziness in the left upper quadrant and anterior upper abdomen similar to the prior examination. Musculoskeletal: There are bilateral pars interarticularis defects at L5 without significant listhesis. IMPRESSION: 1. Again seen are findings of small-bowel obstruction. Transition point is likely in the mid abdomen where there are clustered small bowel loops adhered to the anterior abdominal wall. Can not exclude underlying obstructive lesion. Degree of distention has increased when compared to the prior exam. There is now air-fluid level in the stomach. 2. Increasing free fluid in the right abdomen. 3. New small left pleural effusion. 4. Stable diffuse omental haziness is unchanged, concerning for metastatic disease. Electronically  Signed   By: Ronney Asters M.D.   On: 09/02/2021 20:30      Assessment/Plan    Principal Problem:   SBO (small bowel obstruction) (HCC) Active Problems:   Nausea and vomiting   AKI (acute kidney injury) ([place])  Abdominal pain   Leukocytosis   GERD (gastroesophageal reflux disease)   GAD (generalized anxiety disorder)      #) Small bowel obstruction: In the setting of 2 to 3 days of progressive abdominal pain associated with prandial worsening, nausea/vomiting, diminished flatus production, most recent BM occurring a day ago with CT abdomen/pelvis showing evidence of small bowel obstruction with transition point, as noted above as well as air-fluid level within the stomach, as further detailed above.  This is in the close proximity of recent hospitalization for small bowel suction that resolved with conservative measures.  Overall, this is the context of a known history of metastatic colon cancer, with prior abdominal surgeries notable for partial colectomy in March 2022.  Per physical exam, no evidence of acute peritoneal signs at this time.  case discussed with the on-call general surgeon, Dr. Redmond Pulling, who recommended admission to the hospital service for further evaluation management of small bowel obstruction, including initiation of conservative measures along with NG tube, with plan for Dr. Redmond Pulling to formally consult and see the patient in the morning.     Plan: NPO.  Continuous IV fluids.  As needed IV Dilaudid.  Prn IV Zofran.  Add on serum magnesium level.  Repeat CMP/CMP in the morning.  General surgery consulted, as above.  NGT attached to low intermittent wall suction, as above.       #) Acute kidney injury: Presenting creatinine 1.29 relative to most recent prior value 1.0 on 08/29/2021.  Suspect this is prerenal in nature in the setting of dehydration from increased GI losses from nausea/vomiting as well as associated decline in oral intake all in the context of  presenting small bowel obstruction, as above.  Plan: Continuous IV fluids, as above.  Monitor strict I's and O's Daily weights.  Tempt avoid nephrotoxic agents.  Check urinalysis with microscopy.  Add on random urine sodium as well as random urine creatinine.  Repeat BMP in the morning.      #) Leukocytosis: Presenting glucose of count is 16,500, without evidence of underlying infectious process at this time, including COVID-19/influenza PCR results negative.  No acute respiratory symptoms to warrant evaluation via chest x-ray at this time.  Rather, suspect that this elevation is inflammatory/reactive in nature in the setting of as underlying malignancy as well as small bowel obstruction, with an element of hemoconcentration from presenting intravascular depletion from dehydration as a result of this.  Of note, patient's presenting white blood cell count is actually slightly lower than most recent prior value of 16,700 on 08/30/2019.  Plan: Further evaluation management presenting SBO, as above.  Continuous IV fluids.  Repeat CBC differential in the morning.  Check urinalysis.       #) GERD: Documented history of such, on Protonix as an outpatient.  In setting of n.p.o. status is management of presenting SBO, hold home oral Protonix and start instead IV administration daily of this medication.  Plan: Protonix 40 mg IV daily daily while remaining n.p.o.      #) Generalized anxiety disorder: Document history of such, prn Xanax as an outpatient.  In the setting of current n.p.o. status, will hold home prn Xanax, and instead order as needed IV Ativan.  Plan: Prn IV Ativan for anxiety, as above.     DVT prophylaxis: SCD's   Code Status: Full code Family Communication: none Disposition Plan: Per Rounding Team Consults called: EDP consulted Dr. Redmond Pulling of general surgery, as further detailed above;  Admission status: Inpatient; med telemetry  Warrants inpatient status on basis of  further evaluation and management of presenting small bowel obstruction, including close monitoring for resumption of normal bowel habits while providing supportive care that includes need for IV analgesia as well as IV Zofran in the setting of concomitant n.p.o. status as component of management of SBO.  Additionally, as the patient is tolerating p.o., requires IV fluid administration, particular in setting of AKI based upon dehydration.   PLEASE NOTE THAT DRAGON DICTATION SOFTWARE WAS USED IN THE CONSTRUCTION OF THIS NOTE.   Aneta DO [facility] [place]   09/02/2021, 9:51 PM

## 2021-09-02 NOTE — ED Provider Notes (Signed)
Emergency Medicine Provider Triage Evaluation Note  Ricardo [name] , a 55 y.o. male  was evaluated in triage.  Pt complains of n/v. Hx of colon cancer. Has had multiple episodes of hospitalizations for the same recently. Known colon mass.  Review of Systems  Positive: vomiting Negative: fever  Physical Exam  BP (!) 112/94    Pulse (!) 125    Temp 98 F (36.7 C) (Oral)    Resp 17    Ht 5\' 6"  (1.676 m)    Wt 118 kg    SpO2 94%    BMI 41.99 kg/m  Gen:   Awake, no distress   Resp:  Normal effort  MSK:   Moves extremities without difficulty Other:  Bowel sounds present  Medical Decision Making  Medically screening exam initiated at 5:42 PM.  Appropriate orders placed.  [name] was informed that the remainder of the evaluation will be completed by another provider, this initial triage assessment does not replace that evaluation, and the importance of remaining in the ED until their evaluation is complete.  N/v - labs ordered   Margarita Mail, PA-C 09/02/21 1753    Gareth Morgan, MD 09/03/21 1550

## 2021-09-02 NOTE — ED Triage Notes (Signed)
Patient states he was recently admitted and discharged on Friday for same. States he is unable to keep anything down. C/o abdominal pain. States last chemotherapy 3 weeks ago. Took home pain meds w/o relief.

## 2021-09-02 NOTE — ED Notes (Signed)
Attempted to obtain urine sample for UA. Pt unable to urinate at this time. Requested to have urinal at bedside.

## 2021-09-02 NOTE — Progress Notes (Signed)
Ricardo [name]   DOB:[birth year]   XB#:[id number]   NUU#:[id number]  Oncology follow up   Subjective: Patient is well-known to me, under my care for his metastatic colon cancer.  He is on palliative chemotherapy.  He was recently admitted for small bowel obstruction which resolved after conservative measurements, and discharged home last week.  He now presented emergency room with persistent nausea, vomiting, and abdominal pain for past few days. He called me today and I instructed him to come in to [facility] ED. I saw him in ED, wife was with him.   Objective:  Vitals:   09/02/21 2030 09/02/21 2139  BP: (!) 148/113 (!) 143/96  Pulse: (!) 104 (!) 109  Resp: 16 18  Temp:    SpO2: 94% 94%    Body mass index is 41.99 kg/m.  Intake/Output Summary (Last 24 hours) at 09/02/2021 2224 Last data filed at 09/02/2021 2017 Gross per 24 hour  Intake 1002.92 ml  Output --  Net 1002.92 ml     Sclerae unicteric  Oropharynx clear  No peripheral adenopathy  Lungs clear -- no rales or rhonchi  Heart regular rate and rhythm  Abdomen soft, mild diffuse tenderness on left side   MSK no focal spinal tenderness, no peripheral edema  Neuro nonfocal    CBG (last 3)  No results for input(s): GLUCAP in the last 72 hours.   Labs:  Urine Studies No results for input(s): UHGB, CRYS in the last 72 hours.  Invalid input(s): UACOL, UAPR, USPG, UPH, UTP, UGL, UKET, UBIL, UNIT, UROB, [place], UEPI, UWBC, [place]wayne [place], [place]  Basic Metabolic Panel: Recent Labs  Lab 08/27/21 [phone] 08/28/21 0307 08/29/21 0414 09/02/21 1741 09/02/21 2035  NA 136 135 135 143 133*  K 3.9 3.8 3.7 <2.0* 4.1  CL 102 104 101 129* 101  CO2 25 24 25  12* 23  GLUCOSE 107* 83 104* 53* 100*  BUN 13 11 7 11 20   CREATININE 1.08 1.30* 1.00 0.46* 1.29*  CALCIUM 8.7* 8.2* 8.7* <4.0* 9.0  MG  --  1.9  --   --  1.9   GFR Estimated Creatinine Clearance: 77.3 mL/min (A) (by C-G formula based on SCr of 1.29 mg/dL (H)). Liver Function  Tests: Recent Labs  Lab 08/27/21 0126 09/02/21 1741 09/02/21 2035  AST 18 13* 29  ALT 26 18 42  ALKPHOS 152* 42 101  BILITOT 0.7 0.4 0.8  PROT 8.0 <3.0* 7.5  ALBUMIN 3.9 <1.5* 3.5   Recent Labs  Lab 08/27/21 0126 09/02/21 1741  LIPASE 34 24   No results for input(s): AMMONIA in the last 168 hours. Coagulation profile No results for input(s): INR, PROTIME in the last 168 hours.  CBC: Recent Labs  Lab 08/27/21 0126 08/27/21 0623 08/28/21 0307 08/29/21 0414 09/02/21 1741  WBC 19.3* 15.7* 13.7* 16.1* 16.7*  NEUTROABS 17.2*  --   --   --  13.7*  HGB 14.8 13.2 12.5* 13.1 14.5  HCT 43.2 39.0 38.1* 39.0 43.7  MCV 88.3 90.7 92.0 88.8 89.5  PLT 221 202 203 224 338   Cardiac Enzymes: No results for input(s): CKTOTAL, CKMB, CKMBINDEX, TROPONINI in the last 168 hours. BNP: Invalid input(s): POCBNP CBG: No results for input(s): GLUCAP in the last 168 hours. D-Dimer No results for input(s): DDIMER in the last 72 hours. Hgb A1c No results for input(s): HGBA1C in the last 72 hours. Lipid Profile No results for input(s): CHOL, HDL, LDLCALC, TRIG, CHOLHDL, LDLDIRECT in the last 72 hours.  Thyroid function studies No results for input(s): TSH, T4TOTAL, T3FREE, THYROIDAB in the last 72 hours.  Invalid input(s): FREET3 Anemia work up No results for input(s): VITAMINB12, FOLATE, FERRITIN, TIBC, IRON, RETICCTPCT in the last 72 hours. Microbiology Recent Results (from the past 240 hour(s))  Resp Panel by RT-PCR (Flu A&B, Covid) Nasopharyngeal Swab     Status: None   Collection Time: 08/27/21  3:00 AM   Specimen: Nasopharyngeal Swab; Nasopharyngeal(NP) swabs in vial transport medium  Result Value Ref Range Status   SARS Coronavirus 2 by RT PCR NEGATIVE NEGATIVE Final    Comment: (NOTE) SARS-CoV-2 target nucleic acids are NOT DETECTED.  The SARS-CoV-2 RNA is generally detectable in upper respiratory specimens during the acute phase of infection. The lowest concentration of  SARS-CoV-2 viral copies this assay can detect is 138 copies/mL. A negative result does not preclude SARS-Cov-2 infection and should not be used as the sole basis for treatment or other patient management decisions. A negative result may occur with  improper specimen collection/handling, submission of specimen other than nasopharyngeal swab, presence of viral mutation(s) within the areas targeted by this assay, and inadequate number of viral copies(<138 copies/mL). A negative result must be combined with clinical observations, patient history, and epidemiological information. The expected result is Negative.  Fact Sheet for Patients:  EntrepreneurPulse.com.au  Fact Sheet for Healthcare Providers:  IncredibleEmployment.be  This test is no t yet approved or cleared by the [place] FDA and  has been authorized for detection and/or diagnosis of SARS-CoV-2 by FDA under an Emergency Use Authorization (EUA). This EUA will remain  in effect (meaning this test can be used) for the duration of the COVID-19 declaration under Section 564(b)(1) of the Act, 21 U.S.C.section 360bbb-3(b)(1), unless the authorization is terminated  or revoked sooner.       Influenza A by PCR NEGATIVE NEGATIVE Final   Influenza B by PCR NEGATIVE NEGATIVE Final    Comment: (NOTE) The Xpert Xpress SARS-CoV-2/FLU/RSV plus assay is intended as an aid in the diagnosis of influenza from Nasopharyngeal swab specimens and should not be used as a sole basis for treatment. Nasal washings and aspirates are unacceptable for Xpert Xpress SARS-CoV-2/FLU/RSV testing.  Fact Sheet for Patients: EntrepreneurPulse.com.au  Fact Sheet for Healthcare Providers: IncredibleEmployment.be  This test is not yet approved or cleared by the [place] FDA and has been authorized for detection and/or diagnosis of SARS-CoV-2 by FDA under an Emergency Use  Authorization (EUA). This EUA will remain in effect (meaning this test can be used) for the duration of the COVID-19 declaration under Section 564(b)(1) of the Act, 21 U.S.C. section 360bbb-3(b)(1), unless the authorization is terminated or revoked.  Performed at [facility], [place] [address], [place], [place] [address]       Studies:  CT ABDOMEN PELVIS W CONTRAST  Result Date: 09/02/2021 CLINICAL DATA:  Bowel obstruction suspected. Abdominal pain. Chemotherapy. Three weeks ago. EXAM: CT ABDOMEN AND PELVIS WITH CONTRAST TECHNIQUE: Multidetector CT imaging of the abdomen and pelvis was performed using the standard protocol following bolus administration of intravenous contrast. CONTRAST:  54mL OMNIPAQUE IOHEXOL 350 MG/ML SOLN COMPARISON:  CT abdomen and pelvis 08/27/2021. FINDINGS: Lower chest: There is a new trace left pleural effusion. There is atelectasis in the bilateral lung bases. Hepatobiliary: No focal liver abnormality is seen. No gallstones, gallbladder wall thickening, or biliary dilatation. Pancreas: Unremarkable. No pancreatic ductal dilatation or surrounding inflammatory changes. Spleen: Normal in size without focal abnormality. Adrenals/Urinary Tract: Adrenal glands are  unremarkable. Kidneys are normal, without renal calculi, focal lesion, or hydronephrosis. Bladder is unremarkable. Stomach/Bowel: There are dilated mid and proximal small bowel loops with air-fluid levels measuring up to 5 cm. Degree of distention has mildly increased. There is also new moderate air-fluid level within the stomach. Transition point is likely in the mid abdomen where there are multiple small bowel loops demonstrating adhesive disease to the anterior abdominal wall. This finding is unchanged. The colon is nondilated. The appendix is not seen. There is no pneumatosis or free air identified. Vascular/Lymphatic: Aortic atherosclerosis. No enlarged abdominal or pelvic lymph nodes. Nonenlarged  cardiophrenic and ileocecal lymph nodes are unchanged. Reproductive: Prostate is unremarkable. Other: There are small fat containing bilateral inguinal hernias. There is a small amount of free fluid in the anterior right abdomen which has mildly increased. There is mesenteric/omental haziness in the left upper quadrant and anterior upper abdomen similar to the prior examination. Musculoskeletal: There are bilateral pars interarticularis defects at L5 without significant listhesis. IMPRESSION: 1. Again seen are findings of small-bowel obstruction. Transition point is likely in the mid abdomen where there are clustered small bowel loops adhered to the anterior abdominal wall. Can not exclude underlying obstructive lesion. Degree of distention has increased when compared to the prior exam. There is now air-fluid level in the stomach. 2. Increasing free fluid in the right abdomen. 3. New small left pleural effusion. 4. Stable diffuse omental haziness is unchanged, concerning for metastatic disease. Electronically Signed   By: Ronney Asters M.D.   On: 09/02/2021 20:30    Assessment: 55 y.o. 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  He will have further work-up in the ED -He will likely needs to be admitted to [facility] for further management, including IV hydration, bowel rest, and pain management.  I discussed the option of TPN if he has recurrent symptoms with liquid also, or not able to take enough oral intake.  -due to his young age and otherwise good performance status, I would like to offer him more cancer treatment, he is not ready for hospice yet. He understands surgery is not an option at this point.  -I spoke with ED provider, and will  f/u him tomorrow in hospital.   [name]  09/02/2021    [name], MD 09/02/2021

## 2021-09-02 NOTE — ED Provider Notes (Signed)
[facility] DEPT Provider Note   CSN: [id number] Arrival date & time: 09/02/21  1704     History Chief Complaint  Patient presents with   Abdominal Pain   Emesis    Ricardo [name] is a 55 y.o. male.  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  Felt symptoms were as improved as they could be given the circumstances, started passing things, having BM and left the hospital, however after leaving felt like no appetite, stomach shrunk, could drink 2 dixie cups of water and would feel full. If he tries to drink anything more will burp and vomit.   Went to [employer], threw everything up, drank very little, vomited 6 times after that day before yesterday.  Has been that way since leaving the hospital, not able to keep anything down, feels like it is progressing since then.  Can't process it as fast as can put it in. Been having diarrhea, today first time was hard stool has not had BM since then. Has been passing flatus but today it stopped. Vomited twice today.  Just feels bloated, distended.   Sees Dr. Burr Medico as an outpatient-she recommends admission, may discuss need for TPN.   Past Medical History:  Diagnosis Date   Arthritis    Colon cancer ([place])    with metastasis   Family history of adverse reaction to anesthesia    mother had problem with it due to her asthma   Family history of breast cancer    Family history of prostate cancer     Patient Active Problem List   Diagnosis Date Noted   Genetic testing 01/17/2021   Dehydration 01/04/2021   Abdominal pain 01/04/2021   Nausea & vomiting 01/04/2021   High anion gap metabolic acidosis 88/07/314   Leukocytosis 01/04/2021   AKI (acute kidney  injury) ([place]) 01/03/2021   Port-A-Cath in place 01/02/2021   Family history of breast cancer    Family history of prostate cancer    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[place]) 12/04/2020   SBO (small bowel obstruction) ([place]) 11/27/2020   Ileitis 11/27/2020   Intractable abdominal pain 11/27/2020   Nausea and vomiting 11/27/2020   Hyperlipidemia 11/27/2020   Lymphadenopathy, abdominal 11/27/2020   Body mass index (BMI) of 39.0-39.9 in adult 11/10/2018   Arthralgia 11/10/2018   Sleep disorder 11/10/2018    Past Surgical History:  Procedure Laterality Date   BOWEL RESECTION N/A 11/29/2020   Procedure: PARTIAL BOWEL RESECTION;  Surgeon: Jovita Kussmaul, MD;  Location: [facility] OR;  Service: General;  Laterality: N/A;   GASTROSTOMY Left 11/29/2020   Procedure: INSERTION OF GASTROSTOMY TUBE;  Surgeon: Jovita Kussmaul, MD;  Location: [facility] OR;  Service: General;  Laterality: Left;   IR IMAGING GUIDED PORT INSERTION  12/12/2020   LAPAROSCOPIC APPENDECTOMY N/A 01/17/2019   Procedure: APPENDECTOMY LAPAROSCOPIC;  Surgeon: Ralene Ok, MD;  Location: [place];  Service: General;  Laterality: N/A;   LAPAROTOMY N/A 11/29/2020   Procedure: EXPLORATORY LAPAROTOMY;  Surgeon: Jovita Kussmaul, MD;  Location: [place];  Service: General;  Laterality: N/A;  PUT CASE IN ROOM 1 STARTING AT 9:30AM FOR 120 MIN   OSTOMY N/A 11/29/2020   Procedure: POSSIBLE OSTOMY CREATION;  Surgeon: Autumn Messing III,  MD;  Location: [place];  Service: General;  Laterality: N/A;   RECTAL BIOPSY N/A 11/29/2020   Procedure: PERITIONEAL BIOPSY;  Surgeon: Jovita Kussmaul, MD;  Location: [place];  Service: General;  Laterality: N/A;   SHOULDER SURGERY Left 09/19/2015       Family History  Problem Relation Age of Onset   Cancer Sister 4       breast cancer   Cancer Brother 83       prostate cancer    High Cholesterol Brother    Pancreatic cancer Maternal Uncle    Bone cancer Cousin        pat first cousin   Colon  cancer Neg Hx    Liver disease Neg Hx    Esophageal cancer Neg Hx    Stomach cancer Neg Hx     Social History   Tobacco Use   Smoking status: Never   Smokeless tobacco: Never  Vaping Use   Vaping Use: Never used  Substance Use Topics   Alcohol use: Not Currently    Comment: rare   Drug use: Never    Home Medications Prior to Admission medications   Medication Sig Start Date End Date Taking? Authorizing Provider  acetaminophen (TYLENOL) 500 MG tablet Take 500-1,000 mg by mouth every 6 (six) hours as needed for mild pain.    [provider]  ALPRAZolam Duanne Moron) 0.25 MG tablet Take 1-2 tablets (0.25-0.5 mg total) by mouth at bedtime as needed for anxiety or sleep. 07/17/21   [name], MD  B COMPLEX VITAMINS SL Place 1 dropperful under the tongue daily    [provider]  dicyclomine (BENTYL) 10 MG capsule TAKE 1 CAPSULE (10 MG TOTAL) BY MOUTH 4 (FOUR) TIMES DAILY - BEFORE MEALS AND AT BEDTIME. Patient taking differently: Take 10 mg by mouth daily as needed (for constipation). 04/18/21   Alla Feeling, NP  hydrocortisone (ANUSOL-HC) 2.5 % rectal cream Place 1 application rectally 2 (two) times daily. Patient not taking: Reported on 08/27/2021 03/27/21   Alla Feeling, NP  ondansetron (ZOFRAN) 8 MG tablet TAKE 1 TABLET BY MOUTH 2TIMES DAILY AS NEEDED FOR NAUSEA/VOMITING. START ON DAY3 AFTER CHEMOTHERAPY. Patient not taking: Reported on 08/27/2021 01/16/21   [provider]  oxyCODONE (OXY IR/ROXICODONE) 5 MG immediate release tablet Take 1 tablet (5 mg total) by mouth every 8 (eight) hours as needed for severe pain. Patient taking differently: Take 5-10 mg by mouth every 8 (eight) hours as needed for severe pain. 07/17/21   [name], MD  pantoprazole (PROTONIX) 40 MG tablet Take 1 tablet (40 mg total) by mouth daily. 08/29/21 08/29/22  Pokhrel, Corrie Mckusick, MD  polyethylene glycol (MIRALAX / GLYCOLAX) 17 g packet Take 17 g by mouth daily as needed for mild  constipation.    [provider]  prochlorperazine (COMPAZINE) 10 MG tablet Take 1 tablet (10 mg total) by mouth every 6 (six) hours as needed for nausea or vomiting. 01/16/21   [name], MD    Allergies    Oxaliplatin  Review of Systems   Review of Systems  Constitutional:  Positive for activity change, appetite change and fatigue. Negative for fever.  HENT:  Negative for congestion and sore throat.   Respiratory:  Negative for cough and shortness of breath.   Cardiovascular:  Negative for leg swelling. Chest pain: acid reflux, took nexium without relief. Gastrointestinal:  Positive for abdominal distention, abdominal pain, constipation, nausea and vomiting. Diarrhea: now resolved. Genitourinary:  Negative  for dysuria.  Neurological:  Negative for headaches.   Physical Exam Updated Vital Signs BP (!) 148/106    Pulse (!) 114    Temp 98 F (36.7 C) (Oral)    Resp (!) 23    Ht 5\' 6"  (1.676 m)    Wt 118 kg    SpO2 93%    BMI 41.99 kg/m   Physical Exam Vitals and nursing note reviewed.  Constitutional:      General: He is not in acute distress.    Appearance: He is well-developed. He is not diaphoretic.  HENT:     Head: Normocephalic and atraumatic.  Eyes:     Conjunctiva/sclera: Conjunctivae normal.  Cardiovascular:     Rate and Rhythm: Normal rate and regular rhythm.     Heart sounds: Normal heart sounds. No murmur heard.   No friction rub. No gallop.  Pulmonary:     Effort: Pulmonary effort is normal. No respiratory distress.     Breath sounds: Normal breath sounds. No wheezing or rales.  Abdominal:     General: There is distension.     Palpations: Abdomen is soft.     Tenderness: There is generalized abdominal tenderness. There is no guarding.  Musculoskeletal:     Cervical back: Normal range of motion.  Skin:    General: Skin is warm and dry.  Neurological:     Mental Status: He is alert and oriented to person, place, and time.    ED Results / Procedures /  Treatments   Labs (all labs ordered are listed, but only abnormal results are displayed) Labs Reviewed  CBC WITH DIFFERENTIAL/PLATELET - Abnormal; Notable for the following components:      Result Value   WBC 16.7 (*)    Neutro Abs 13.7 (*)    Monocytes Absolute 1.5 (*)    Abs Immature Granulocytes 0.12 (*)    All other components within normal limits  COMPREHENSIVE METABOLIC PANEL - Abnormal; Notable for the following components:   Potassium <2.0 (*)    Chloride 129 (*)    CO2 12 (*)    Glucose, Bld 53 (*)    Creatinine, Ser 0.46 (*)    Calcium <4.0 (*)    Total Protein <3.0 (*)    Albumin <1.5 (*)    AST 13 (*)    Anion gap 2 (*)    All other components within normal limits  COMPREHENSIVE METABOLIC PANEL - Abnormal; Notable for the following components:   Sodium 133 (*)    Glucose, Bld 100 (*)    Creatinine, Ser 1.29 (*)    All other components within normal limits  RESP PANEL BY RT-PCR (FLU A&B, COVID) ARPGX2  LIPASE, BLOOD  MAGNESIUM  URINALYSIS, ROUTINE W REFLEX MICROSCOPIC  MAGNESIUM  MAGNESIUM  COMPREHENSIVE METABOLIC PANEL  CBC WITH DIFFERENTIAL/PLATELET  TROPONIN I (HIGH SENSITIVITY)  TROPONIN I (HIGH SENSITIVITY)    EKG EKG Interpretation  Date/Time:  Tuesday September 02 2021 17:29:02 EST Ventricular Rate:  128 PR Interval:  148 QRS Duration: 89 QT Interval:  300 QTC Calculation: 438 R Axis:   89 Text Interpretation: Sinus tachycardia Inferior infarct, age indeterminate Since last tracing Rate faster Confirmed by Calvert Cantor [phone]) on 09/02/2021 5:32:55 PM  Radiology DG Abdomen 1 View  Result Date: 09/03/2021 CLINICAL DATA:  Nasogastric tube placement EXAM: ABDOMEN - 1 VIEW COMPARISON:  None. FINDINGS: Nasogastric tube tip overlies the expected mid body of the stomach. Multiple gas-filled dilated loops of small bowel are seen within  the mid abdomen and left upper quadrant. No free intraperitoneal gas. IMPRESSION: Nasogastric tube tip within the  mid body of the stomach. Electronically Signed   By: Fidela Salisbury M.D.   On: 09/03/2021 00:51   CT ABDOMEN PELVIS W CONTRAST  Result Date: 09/02/2021 CLINICAL DATA:  Bowel obstruction suspected. Abdominal pain. Chemotherapy. Three weeks ago. EXAM: CT ABDOMEN AND PELVIS WITH CONTRAST TECHNIQUE: Multidetector CT imaging of the abdomen and pelvis was performed using the standard protocol following bolus administration of intravenous contrast. CONTRAST:  43mL OMNIPAQUE IOHEXOL 350 MG/ML SOLN COMPARISON:  CT abdomen and pelvis 08/27/2021. FINDINGS: Lower chest: There is a new trace left pleural effusion. There is atelectasis in the bilateral lung bases. Hepatobiliary: No focal liver abnormality is seen. No gallstones, gallbladder wall thickening, or biliary dilatation. Pancreas: Unremarkable. No pancreatic ductal dilatation or surrounding inflammatory changes. Spleen: Normal in size without focal abnormality. Adrenals/Urinary Tract: Adrenal glands are unremarkable. Kidneys are normal, without renal calculi, focal lesion, or hydronephrosis. Bladder is unremarkable. Stomach/Bowel: There are dilated mid and proximal small bowel loops with air-fluid levels measuring up to 5 cm. Degree of distention has mildly increased. There is also new moderate air-fluid level within the stomach. Transition point is likely in the mid abdomen where there are multiple small bowel loops demonstrating adhesive disease to the anterior abdominal wall. This finding is unchanged. The colon is nondilated. The appendix is not seen. There is no pneumatosis or free air identified. Vascular/Lymphatic: Aortic atherosclerosis. No enlarged abdominal or pelvic lymph nodes. Nonenlarged cardiophrenic and ileocecal lymph nodes are unchanged. Reproductive: Prostate is unremarkable. Other: There are small fat containing bilateral inguinal hernias. There is a small amount of free fluid in the anterior right abdomen which has mildly increased. There is  mesenteric/omental haziness in the left upper quadrant and anterior upper abdomen similar to the prior examination. Musculoskeletal: There are bilateral pars interarticularis defects at L5 without significant listhesis. IMPRESSION: 1. Again seen are findings of small-bowel obstruction. Transition point is likely in the mid abdomen where there are clustered small bowel loops adhered to the anterior abdominal wall. Can not exclude underlying obstructive lesion. Degree of distention has increased when compared to the prior exam. There is now air-fluid level in the stomach. 2. Increasing free fluid in the right abdomen. 3. New small left pleural effusion. 4. Stable diffuse omental haziness is unchanged, concerning for metastatic disease. Electronically Signed   By: Ronney Asters M.D.   On: 09/02/2021 20:30    Procedures Procedures   Medications Ordered in ED Medications  naloxone [facility]) injection 0.4 mg (has no administration in time range)  HYDROmorphone (DILAUDID) injection 0.5 mg (0.5 mg Intravenous Given 09/03/21 0209)  ondansetron (ZOFRAN) injection 4 mg (has no administration in time range)  acetaminophen (TYLENOL) tablet 650 mg (has no administration in time range)    Or  acetaminophen (TYLENOL) suppository 650 mg (has no administration in time range)  lactated ringers infusion ( Intravenous New Bag/Given 09/02/21 2223)  ondansetron (ZOFRAN) injection 4 mg (4 mg Intravenous Given 09/02/21 1855)  lactated ringers bolus 1,000 mL (0 mLs Intravenous Stopped 09/02/21 2017)  morphine 4 MG/ML injection 4 mg (4 mg Intravenous Given 09/02/21 1855)  HYDROmorphone (DILAUDID) injection 1 mg (1 mg Intravenous Given 09/02/21 2030)  iohexol (OMNIPAQUE) 350 MG/ML injection 80 mL (80 mLs Intravenous Contrast Given 09/02/21 2001)  HYDROmorphone (DILAUDID) injection 1 mg (1 mg Intravenous Given 09/02/21 2218)  midazolam (VERSED) injection 1 mg (1 mg Intravenous Given 09/02/21 2216)  ED Course  I have  reviewed the triage vital signs and the nursing notes.  Pertinent labs & imaging results that were available during my care of the patient were reviewed by me and considered in my medical decision making (see chart for details).    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  CT today shows findings of SBO, bowel loops adhered to anterior abdominal wall, cannot exclude obstructive lesion, increased distention in comparison to prior.  Discussed with Dr. Redmond Pulling of General Surgery.  Plan to place NG tube, surgery to see in AM.  Admitted to hospitalist for further care.       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

## 2021-09-03 ENCOUNTER — Inpatient Hospital Stay (HOSPITAL_COMMUNITY): Payer: BC Managed Care – PPO

## 2021-09-03 ENCOUNTER — Encounter (HOSPITAL_COMMUNITY): Payer: Self-pay | Admitting: Internal Medicine

## 2021-09-03 DIAGNOSIS — Z515 Encounter for palliative care: Secondary | ICD-10-CM

## 2021-09-03 DIAGNOSIS — K56609 Unspecified intestinal obstruction, unspecified as to partial versus complete obstruction: Secondary | ICD-10-CM | POA: Diagnosis not present

## 2021-09-03 DIAGNOSIS — R1084 Generalized abdominal pain: Secondary | ICD-10-CM | POA: Diagnosis not present

## 2021-09-03 DIAGNOSIS — R112 Nausea with vomiting, unspecified: Secondary | ICD-10-CM | POA: Diagnosis not present

## 2021-09-03 DIAGNOSIS — F411 Generalized anxiety disorder: Secondary | ICD-10-CM | POA: Diagnosis not present

## 2021-09-03 DIAGNOSIS — K219 Gastro-esophageal reflux disease without esophagitis: Secondary | ICD-10-CM

## 2021-09-03 DIAGNOSIS — Z7189 Other specified counseling: Secondary | ICD-10-CM

## 2021-09-03 DIAGNOSIS — C189 Malignant neoplasm of colon, unspecified: Secondary | ICD-10-CM | POA: Diagnosis not present

## 2021-09-03 HISTORY — DX: Gastro-esophageal reflux disease without esophagitis: K21.9

## 2021-09-03 HISTORY — DX: Generalized anxiety disorder: F41.1

## 2021-09-03 LAB — CBC WITH DIFFERENTIAL/PLATELET
Abs Immature Granulocytes: 0.12 10*3/uL — ABNORMAL HIGH (ref 0.00–0.07)
Basophils Absolute: 0.1 10*3/uL (ref 0.0–0.1)
Basophils Relative: 1 %
Eosinophils Absolute: 0.2 10*3/uL (ref 0.0–0.5)
Eosinophils Relative: 1 %
HCT: 42.3 % (ref 39.0–52.0)
Hemoglobin: 13.8 g/dL (ref 13.0–17.0)
Immature Granulocytes: 1 %
Lymphocytes Relative: 7 %
Lymphs Abs: 1.2 10*3/uL (ref 0.7–4.0)
MCH: 29.7 pg (ref 26.0–34.0)
MCHC: 32.6 g/dL (ref 30.0–36.0)
MCV: 91 fL (ref 80.0–100.0)
Monocytes Absolute: 1.4 10*3/uL — ABNORMAL HIGH (ref 0.1–1.0)
Monocytes Relative: 8 %
Neutro Abs: 13.6 10*3/uL — ABNORMAL HIGH (ref 1.7–7.7)
Neutrophils Relative %: 82 %
Platelets: 273 10*3/uL (ref 150–400)
RBC: 4.65 MIL/uL (ref 4.22–5.81)
RDW: 15.3 % (ref 11.5–15.5)
WBC: 16.5 10*3/uL — ABNORMAL HIGH (ref 4.0–10.5)
nRBC: 0 % (ref 0.0–0.2)

## 2021-09-03 LAB — URINALYSIS, COMPLETE (UACMP) WITH MICROSCOPIC
Bacteria, UA: NONE SEEN
Bilirubin Urine: NEGATIVE
Glucose, UA: NEGATIVE mg/dL
Hgb urine dipstick: NEGATIVE
Ketones, ur: NEGATIVE mg/dL
Leukocytes,Ua: NEGATIVE
Nitrite: NEGATIVE
Protein, ur: NEGATIVE mg/dL
Specific Gravity, Urine: >1.046 — ABNORMAL HIGH (ref 1.005–1.030)
pH: 5 (ref 5.0–8.0)

## 2021-09-03 LAB — COMPREHENSIVE METABOLIC PANEL
ALT: 43 U/L (ref 0–44)
ALT: 42 U/L (ref 0–44)
AST: 29 U/L (ref 15–41)
AST: 25 U/L (ref 15–41)
Albumin: 3.6 g/dL (ref 3.5–5.0)
Albumin: 3.6 g/dL (ref 3.5–5.0)
Alkaline Phosphatase: 114 U/L (ref 38–126)
Alkaline Phosphatase: 107 U/L (ref 38–126)
Anion gap: 10 (ref 5–15)
Anion gap: 10 (ref 5–15)
BUN: 21 mg/dL — ABNORMAL HIGH (ref 6–20)
BUN: 20 mg/dL (ref 6–20)
CO2: 26 mmol/L (ref 22–32)
CO2: 26 mmol/L (ref 22–32)
Calcium: 9.4 mg/dL (ref 8.9–10.3)
Calcium: 9.3 mg/dL (ref 8.9–10.3)
Chloride: 100 mmol/L (ref 98–111)
Chloride: 100 mmol/L (ref 98–111)
Creatinine, Ser: 1.21 mg/dL (ref 0.61–1.24)
Creatinine, Ser: 1.15 mg/dL (ref 0.61–1.24)
GFR, Estimated: >60 mL/min (ref >60)
GFR, Estimated: >60 mL/min (ref >60)
Glucose, Bld: 106 mg/dL — ABNORMAL HIGH (ref 70–99)
Glucose, Bld: 100 mg/dL — ABNORMAL HIGH (ref 70–99)
Potassium: 4.5 mmol/L (ref 3.5–5.1)
Potassium: 4.4 mmol/L (ref 3.5–5.1)
Sodium: 136 mmol/L (ref 135–145)
Sodium: 136 mmol/L (ref 135–145)
Total Bilirubin: 0.8 mg/dL (ref 0.3–1.2)
Total Bilirubin: 0.9 mg/dL (ref 0.3–1.2)
Total Protein: 7.8 g/dL (ref 6.5–8.1)
Total Protein: 7.7 g/dL (ref 6.5–8.1)

## 2021-09-03 LAB — CREATININE, URINE, RANDOM: Creatinine, Urine: 261.15 mg/dL

## 2021-09-03 LAB — MAGNESIUM: Magnesium: 2.1 mg/dL (ref 1.7–2.4)

## 2021-09-03 LAB — SODIUM, URINE, RANDOM: Sodium, Ur: 25 mmol/L

## 2021-09-03 MED ORDER — PANTOPRAZOLE SODIUM 40 MG IV SOLR
40.0000 mg | INTRAVENOUS | Status: DC
  Administered 2021-09-03 – 2021-09-13 (×11): 40 mg via INTRAVENOUS
  Filled 2021-09-03 (×11): qty 40

## 2021-09-03 MED ORDER — DIATRIZOATE MEGLUMINE & SODIUM 66-10 % PO SOLN
90.0000 mL | Freq: Once | ORAL | Status: AC
  Administered 2021-09-03: 11:00:00 90 mL via NASOGASTRIC
  Filled 2021-09-03: qty 90

## 2021-09-03 MED ORDER — CHLORHEXIDINE GLUCONATE CLOTH 2 % EX PADS
6.0000 | MEDICATED_PAD | Freq: Every day | CUTANEOUS | Status: DC
  Administered 2021-09-04 – 2021-09-13 (×10): 6 via TOPICAL

## 2021-09-03 MED ORDER — LORAZEPAM 2 MG/ML IJ SOLN
0.5000 mg | Freq: Four times a day (QID) | INTRAMUSCULAR | Status: DC | PRN
  Administered 2021-09-03 – 2021-09-13 (×16): 0.5 mg via INTRAVENOUS
  Filled 2021-09-03 (×16): qty 1

## 2021-09-03 NOTE — ED Notes (Signed)
Pt attached to cardiac monitor x3. A&O x4. VSS at this time. NG tube in place to left nare with 100cc's or output to intermittent suction.

## 2021-09-03 NOTE — Consult Note (Signed)
Palliative Care Consult Note                                  Date: 09/03/2021   Patient Name: Ricardo [name]  DOB: 04-Mar-1966  MRN: [id number]  Age / Sex: 55 y.o., male  PCP: [name], MD Referring Physician: Rhetta Mura, DO  Reason for Consultation: Establishing goals of care  HPI/Patient Profile: Palliative Care consult requested for goals of care discussion in this 55 y.o. male  with past medical history of metastatic colon cancer s/p partial bowel resection (11/2020), GERD, and generalized anxiety. He was admitted on 09/02/2021 from home with abdominal pain.  Patient recently hospitalized 12/14 for partial SBO which was improved by conservative measures.  Recent CT of abdomen pelvis showed small bowel obstruction with transition point likely in the mid abdomen.  Per recommendations from general surgery patient admitted to initiate conservative measures and NG placement.  Past Medical History:  Diagnosis Date   Arthritis    Colon cancer ([place])    with metastasis   Family history of adverse reaction to anesthesia    mother had problem with it due to her asthma   Family history of breast cancer    Family history of prostate cancer      Subjective:   This NP Osborne Oman reviewed medical records, received report from team, assessed the patient and then met at the patient's bedside with Mr. Purves and his wife, [name] to discuss diagnosis, prognosis, GOC, EOL wishes disposition and options.  Patient resting but easily awaken to verbal stimuli.  Alert and oriented x3.  Able to engage appropriately in goals of care discussions.  NG tube in place and currently clamped.   Concept of Palliative Care was introduced as specialized medical care for people and their families living with serious illness.  It focuses on providing relief from the symptoms and stress of a serious illness.  The goal is to improve quality of life for  both the patient and the family. Values and goals of care important to patient and family were attempted to be elicited.  I created space and opportunity for patient and family to explore state of health prior to admission, thoughts, and feelings.  Patient shares prior to admission he had only been home several days from a recent admission with similar concerns for small bowel obstruction.  Reports the few times that he did attempt to eat food will feel as if it was sitting at the top of his abdomen/upper gastric area.  Reports ongoing nausea and fatigue.    We discussed His current illness and what it means in the larger context of His on-going co-morbidities. Natural disease trajectory and expectations were discussed.  Mr. Geister verbalized understanding of current illness and co-morbidities.  He and his wife are emotional discussing his challenges due to his cancer over the past several months.  He is hopeful that with continued small bowel obstructions will resolve allowing him the opportunity to continue ongoing treatments to allow him every opportunity to continue to thrive.  Patient is clear and expressed goals to appropriately continue to treat aggressively.  He does understand the severity of his condition and will be accepting when the time comes that there are no other options.  Emotional support provided.  He speaks to his strong [place] faith.  We discussed at length continuing to remain hopeful for the best (stability/improvement) with  some hesitancy of preparing for the worst.  I discussed the importance of continued conversation with family and their medical providers regarding overall plan of care and treatment options, ensuring decisions are within the context of the patients values and GOCs.  Questions and concerns were addressed.  Patient and wife was encouraged to call with questions or concerns.  PMT will continue to support holistically as needed.  Life Review: Mr. Shankland and his  wife [name] have been married for almost 14 years.  He has a son who lives in [place], 2 stepdaughters and 2 grandchildren.  He is a current Pharmacist, hospital at [employer].  Christian faith.   Objective:   Primary Diagnoses: Present on Admission:  SBO (small bowel obstruction) ([facility])  Abdominal pain  AKI (acute kidney injury) ([place])  Leukocytosis  Nausea and vomiting  GERD (gastroesophageal reflux disease)  GAD (generalized anxiety disorder)   Scheduled Meds:  pantoprazole (PROTONIX) IV  40 mg Intravenous Q24H    Continuous Infusions:  lactated ringers 75 mL/hr at 09/03/21 1419    PRN Meds: acetaminophen **OR** acetaminophen, HYDROmorphone (DILAUDID) injection, LORazepam, naLOXone (NARCAN)  injection, ondansetron (ZOFRAN) IV  Allergies  Allergen Reactions   Oxaliplatin Hives and Other (See Comments)    15 minutes into infusion, patient started to complain of feeling warm and states " it feels like the last time I had my reaction"    Review of Systems  Constitutional:  Positive for appetite change.  Gastrointestinal:  Positive for abdominal distention, abdominal pain, nausea and vomiting.  Neurological:  Positive for weakness.  Unless otherwise noted, a complete review of systems is negative.  Physical Exam General: NAD Cardiovascular: regular rate and rhythm Pulmonary: clear ant fields, normal breathing pattern Abdomen: Firm, tender, largely distended , hypoactive, NG in place Extremities: no edema, no joint deformities Skin: no rashes, warm and dry Neurological: AAOx3, mood appropriate  Vital Signs:  BP (!) 126/91 (BP Location: Right Arm)    Pulse (!) 104    Temp 97.6 F (36.4 C) (Oral)    Resp 20    Ht '5\' 11"'  (1.803 m)    Wt 118 kg    SpO2 94%    BMI 36.28 kg/m  Pain Scale: 0-10   Pain Score: 3   SpO2: SpO2: 94 % O2 Device:SpO2: 94 % O2 Flow Rate: .   IO: Intake/output summary:  Intake/Output Summary (Last 24 hours) at 09/03/2021 1616 Last data filed at  09/03/2021 2703 Gross per 24 hour  Intake 1002.92 ml  Output 700 ml  Net 302.92 ml    LBM: Last BM Date: 08/28/21 Baseline Weight: Weight: 118 kg Most recent weight: Weight: 118 kg      Palliative Assessment/Data: PPS 60%   Advanced Care Planning:   Primary Decision Maker: PATIENT  Code Status/Advance Care Planning: Limited code  A discussion was had today regarding advanced directives. Concepts specific to code status, artifical feeding and hydration, continued IV antibiotics and rehospitalization was had.    I discussed at length with patient and his wife his current full CODE STATUS with consideration of his current illness and comorbidities.  He expresses his peace with end-of-life when that time comes.  He would like all measures taken with a limitation of no intubation.  Education provided on the appropriateness for ACLS, chest compressions, defibrillation.  He is open to chest compressions however if no response or intubation required he would then wish to be allowed a natural death.   Palliative Care services outpatient  were explained and offered. Patient and wife verbalized understanding and awareness of palliative's goals and philosophy of care.  Mr. Berent is appreciative of ongoing support while hospitalized and at discharge.  He is aware I will continue to follow him also at [facility] once he is discharged.   Assessment & Plan:   SUMMARY OF RECOMMENDATIONS   Limited code (no intubation/ventilation)-as requested by patient.  If all attempts are unsuccessful or patient requires intubation he would then wish to have a natural death. Continue with current plan of care.  Continue to treat the treatable aggressively allowing Mr. Morissette every opportunity to continue to improve and thrive.  He is remaining hopeful for the opportunity to have ongoing treatment for his cancer. PMT will continue to support and follow as needed. Please call team line with urgent  needs.  Symptom Management:  Per Attending  Palliative Prophylaxis:  Bowel Regimen and Frequent Pain Assessment  Additional Recommendations (Limitations, Scope, Preferences): Full Scope Treatment and no intubation/ventilation  Psycho-social/Spiritual:  Desire for further Chaplaincy support: no Additional Recommendations:  Ongoing goals of care discussions and support  Prognosis:  Guarded  Discharge Planning:  Home with Palliative Services outpatient at the cancer center.  Patient and his wife expressed understanding and was in agreement with this plan.   Time Total: 55  min  Visit consisted of counseling and education dealing with the complex and emotionally intense issues of symptom management and palliative care in the setting of serious and potentially life-threatening illness.Greater than 50%  of this time was spent counseling and coordinating care related to the above assessment and plan.  Signed by:  Alda Lea, AGPCNP-BC Palliative Medicine Team  Phone: [phone] Pager: [phone] Amion: Bjorn Pippin   Thank you for allowing the Palliative Medicine Team to assist in the care of this patient. Please utilize secure chat with additional questions, if there is no response within 30 minutes please call the above phone number. Palliative Medicine Team providers are available by phone from 7am to 5pm daily and can be reached through the team cell phone.  Should this patient require assistance outside of these hours, please call the patient's attending physician.

## 2021-09-03 NOTE — Plan of Care (Signed)
Problem: Education: Goal: Knowledge of General Education information will improve Description: Including pain rating scale, medication(s)/side effects and non-pharmacologic comfort measures Outcome: Progressing   Problem: Health Behavior/Discharge Planning: Goal: Ability to manage health-related needs will improve Outcome: Progressing   Problem: Clinical Measurements: Goal: Diagnostic test results will improve Outcome: Progressing   Problem: Activity: Goal: Risk for activity intolerance will decrease Outcome: Completed/Met   Problem: Coping: Goal: Level of anxiety will decrease Outcome: Progressing

## 2021-09-03 NOTE — ED Notes (Signed)
This nurse entered the pt's room to find the pt has been having small sips of water throughout the night, despite being NPO with NG tube in place. Hospitalist, Dr. Sherral Hammers, notified at this time. Awaiting further orders.

## 2021-09-03 NOTE — Progress Notes (Signed)
Progress Note     Subjective: Patient recently admitted with SBO 12/14-12/16. Regained bowel function with conservative management. Over the last 2-3 days he noted progressive discomfort which is sharp and crampy. Associated nausea and vomiting almost immediately after eating and drinking over the last 1-2 days. Last BM 12/20, decreased flatus. He is followed by Dr. Burr Medico with Oncology. He is feeling much better since NGT placement, but has not had return in bowel function. He is resistant to idea of gastrostomy placemen because he still works full time. We discussed if obstruction does not resolve and he does not want gastrostomy placement, then this may be a terminal event. He has not seen palliative care as an outpatient yet.   From previous consult on 12/14: Patient has a history of ex lap, omental/peritoneal biopsies and insertion of G-tube by Dr. [name] on 11/29/2020. He has since been on multiple rounds of chemotherapy.  He reports his G-tube came out roughly 6 weeks after placement.  He recently underwent [facility] and was found to have extensive peritoneal metastasis and was not felt to be a candidate for HIPEC. He is currently on FOLFIRI and bevacizumab. Other abdominal surgeries include appendectomy.   Objective: Vital signs in last 24 hours: Temp:  [97.9 F (36.6 C)-98 F (36.7 C)] 97.9 F (36.6 C) (12/21 0743) Pulse Rate:  [102-126] 103 (12/21 0743) Resp:  [15-33] 17 (12/21 0743) BP: (112-148)/(92-113) 122/92 (12/21 0756) SpO2:  [22 %-98 %] 93 % (12/21 0743) Weight:  [694 kg] 118 kg (12/20 1723)    Intake/Output from previous day: 12/20 0701 - 12/21 0700 In: 1002.9 [IV Piggyback:1002.9] Out: 700 [Emesis/NG output:700] Intake/Output this shift: No intake/output data recorded.  PE: General: pleasant, WD, obese male who is laying in bed in NAD HEENT: head is normocephalic, atraumatic.  Sclera are anicteric.  Pupils equal and round.   Ears and nose without any masses or lesions.  Mouth is pink and moist Heart: regular, rate, and rhythm.  Normal s1,s2. No obvious murmurs, gallops, or rubs noted.  Palpable radial and pedal pulses bilaterally Lungs: CTAB, no wheezes, rhonchi, or rales noted.  Respiratory effort nonlabored Abd: soft, NT, moderate distention, BS hypoactive, NGT with thin bilious drainage MS: all 4 extremities are symmetrical with no cyanosis, clubbing, or edema. Skin: warm and dry with no masses, lesions, or rashes Neuro: Cranial nerves 2-12 grossly intact, sensation is normal throughout Psych: A&Ox3 with an appropriate affect.    Lab Results:  Recent Labs    09/02/21 1741 09/03/21 0421  WBC 16.7* 16.5*  HGB 14.5 13.8  HCT 43.7 42.3  PLT 338 273   BMET Recent Labs    09/02/21 2035 09/03/21 0421  NA 133* 136  K 4.1 4.5  CL 101 100  CO2 23 26  GLUCOSE 100* 106*  BUN 20 21*  CREATININE 1.29* 1.21  CALCIUM 9.0 9.4   PT/INR No results for input(s): LABPROT, INR in the last 72 hours. CMP     Component Value Date/Time   NA 136 09/03/2021 0421   NA 138 09/04/2020 0911   K 4.5 09/03/2021 0421   CL 100 09/03/2021 0421   CO2 26 09/03/2021 0421   GLUCOSE 106 (H) 09/03/2021 0421   BUN 21 (H) 09/03/2021 0421   BUN 15 09/04/2020 0911   CREATININE 1.21 09/03/2021 0421   CREATININE 1.21 08/14/2021 1000   CALCIUM 9.4 09/03/2021 0421   PROT 7.8 09/03/2021 0421   PROT  7.3 09/04/2020 0911   ALBUMIN 3.6 09/03/2021 0421   ALBUMIN 4.6 09/04/2020 0911   AST 29 09/03/2021 0421   AST 14 (L) 08/14/2021 1000   ALT 43 09/03/2021 0421   ALT 21 08/14/2021 1000   ALKPHOS 114 09/03/2021 0421   BILITOT 0.8 09/03/2021 0421   BILITOT 0.2 (L) 08/14/2021 1000   GFRNONAA >60 09/03/2021 0421   GFRNONAA >60 08/14/2021 1000   GFRAA 88 09/04/2020 0911   Lipase     Component Value Date/Time   LIPASE 24 09/02/2021 1741       Studies/Results: DG Abdomen 1 View  Result Date: 09/03/2021 CLINICAL DATA:   Nasogastric tube placement EXAM: ABDOMEN - 1 VIEW COMPARISON:  None. FINDINGS: Nasogastric tube tip overlies the expected mid body of the stomach. Multiple gas-filled dilated loops of small bowel are seen within the mid abdomen and left upper quadrant. No free intraperitoneal gas. IMPRESSION: Nasogastric tube tip within the mid body of the stomach. Electronically Signed   By: Fidela Salisbury M.D.   On: 09/03/2021 00:51   CT ABDOMEN PELVIS W CONTRAST  Result Date: 09/02/2021 CLINICAL DATA:  Bowel obstruction suspected. Abdominal pain. Chemotherapy. Three weeks ago. EXAM: CT ABDOMEN AND PELVIS WITH CONTRAST TECHNIQUE: Multidetector CT imaging of the abdomen and pelvis was performed using the standard protocol following bolus administration of intravenous contrast. CONTRAST:  1mL OMNIPAQUE IOHEXOL 350 MG/ML SOLN COMPARISON:  CT abdomen and pelvis 08/27/2021. FINDINGS: Lower chest: There is a new trace left pleural effusion. There is atelectasis in the bilateral lung bases. Hepatobiliary: No focal liver abnormality is seen. No gallstones, gallbladder wall thickening, or biliary dilatation. Pancreas: Unremarkable. No pancreatic ductal dilatation or surrounding inflammatory changes. Spleen: Normal in size without focal abnormality. Adrenals/Urinary Tract: Adrenal glands are unremarkable. Kidneys are normal, without renal calculi, focal lesion, or hydronephrosis. Bladder is unremarkable. Stomach/Bowel: There are dilated mid and proximal small bowel loops with air-fluid levels measuring up to 5 cm. Degree of distention has mildly increased. There is also new moderate air-fluid level within the stomach. Transition point is likely in the mid abdomen where there are multiple small bowel loops demonstrating adhesive disease to the anterior abdominal wall. This finding is unchanged. The colon is nondilated. The appendix is not seen. There is no pneumatosis or free air identified. Vascular/Lymphatic: Aortic atherosclerosis.  No enlarged abdominal or pelvic lymph nodes. Nonenlarged cardiophrenic and ileocecal lymph nodes are unchanged. Reproductive: Prostate is unremarkable. Other: There are small fat containing bilateral inguinal hernias. There is a small amount of free fluid in the anterior right abdomen which has mildly increased. There is mesenteric/omental haziness in the left upper quadrant and anterior upper abdomen similar to the prior examination. Musculoskeletal: There are bilateral pars interarticularis defects at L5 without significant listhesis. IMPRESSION: 1. Again seen are findings of small-bowel obstruction. Transition point is likely in the mid abdomen where there are clustered small bowel loops adhered to the anterior abdominal wall. Can not exclude underlying obstructive lesion. Degree of distention has increased when compared to the prior exam. There is now air-fluid level in the stomach. 2. Increasing free fluid in the right abdomen. 3. New small left pleural effusion. 4. Stable diffuse omental haziness is unchanged, concerning for metastatic disease. Electronically Signed   By: Ronney Asters M.D.   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 [name] on 11/29/2020 and [facility]oscopy [facility] and was found to have extensive peritoneal metastasis - determined not to be a candidate for HIPEC - on current chemo - no surgical options at this point - recommend SBO protocol and pt may need to stay on liquid diet long term if SBO resolves. If obstruction does not resolve would recommend placement of venting gastrostomy vs hospice. If patient elects for gastrostomy, IR would potentially be able to place given gastropexy  at time of surgery in March of this year - strongly recommend inpatient palliative consult for [place] discussions   FEN - NPO, IVF per [facility], NGT to LIWS VTE - SCDs, okay for chemical prophylaxis from a general surgery standpoint ID - None  LOS: 1 day    Norm Parcel, [facility] Surgery 09/03/2021, 8:15 AM Please see Amion for pager number during day hours 7:00am-4:30pm

## 2021-09-03 NOTE — ED Notes (Signed)
NG tube hooked up to LWIS after placement confirmed by Xray.

## 2021-09-03 NOTE — Progress Notes (Signed)
PROGRESS NOTE    Ricardo [name]  EHU:[id number] DOB: 11-Feb-1966 DOA: 09/02/2021 PCP: [name], MD   Brief Narrative:  55 y.o. 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    The patient was recently hospitalized at [facility] from 08/28/2019 to 08/29/2021 for partial small bowel obstruction, subsequently improved with conservative measures, following which had a bowel movement prior to discharge to home on 08/29/2021.  However, over the last 2 to 3 days, he has noted progressive generalized abdominal discomfort, which he describes as sharp to crampy pain, along with worsening abdominal distention over that time.  Pain has been constant over the last day, worsening with palpation of the abdomen as well as any attempts to eat or drink anything.  This is been associated with nausea/vomiting, with the patient noting that he vomits within a few seconds to a minute and attempting to eat or drink anything over the last 1 to 2 days.  Emesis associated with nonbloody findings, no coffee-ground appearance.  Most recent s bowel movement occurred on 09/02/2021, and he has subsequently noted diminished production of flatus.  Reports that this constellation of symptoms are very similar to that which she is experiencing in the days leading up to this most recent previous hospitalization for small bowel obstruction.  Denies any associated subjective fever, chills, rigors, generalized myalgias.  Denies any associated chest pain, palpitations, diaphoresis.  No recent trauma.   In the setting history of metastatic colon cancer, he follows with Dr. Burr Medico of Heme-onc.       ED Course:  Vital signs in the ED were  notable for the following: Temperature max 98.0; heart rate initially 126, which decreased 22 following interval administration of IV fluids, blood pressure 112/94 -138/92; respiratory rate 17-20, oxygen saturation 94 to 98% on room air.   Labs were notable for the following: CMP notable for the following: Bicarbonate 23, creatinine 1.29 compared to most recent prior creatinine data point of 1.0 on 08/29/2021, liver enzymes within normal limits.  High-sensitivity troponin I.  Before.  CBC notable for the following: White blood cell count 16,500 compared to 16,700 on 08/29/2021, platelet count 273.  COVID-19/[place] PCR found to be negative.   Imaging and additional notable ED work-up: CT abdomen/pelvis with contrast suggestive of small bowel obstruction, with transition point likely in the mid abdomen, where there are clustered small bowel loops adhered to the anterior abdominal wall, with air-fluid level noted in the stomach.   EDP discussed the patient's case with the on-call general surgeon, Dr. Redmond Pulling, who recommended admission to the hospital service for further evaluation management of small bowel obstruction, including initiation of conservative measures along with NG tube, with plan for Dr. Redmond Pulling to formally consult and see the patient in the morning.    NG tube placed in the ED, and subsequently attached to low intermittent wall suction.      Subjective: Afebrile overnight, A/O x4.  Patient understands the seriousness of his current situation in regards to cancer.     Assessment & Plan:  Covid vaccination;  Principal Problem:   SBO (small bowel obstruction) (HCC) Active Problems:   Nausea and vomiting   AKI (acute kidney injury) ([place])   Abdominal pain  Leukocytosis   GERD (gastroesophageal reflux disease)   GAD (generalized anxiety disorder)  SBO secondary to metastatic colon cancer --10/21 surgery recommendation:recommend SBO protocol and pt may need to stay on liquid diet  long term if SBO resolves. If obstruction does not resolve would recommend placement of venting gastrostomy vs hospice. If patient elects for gastrostomy, IR would potentially be able to place given gastropexy at time of surgery in March of this year   AKI Lab Results  Component Value Date   CREATININE 1.15 09/03/2021   CREATININE 1.21 09/03/2021   CREATININE 1.29 (H) 09/02/2021   CREATININE 0.46 (L) 09/02/2021   CREATININE 1.00 08/29/2021  -12/21 resolved with hydration   Leukocytosis - No evidence of active infection most likely secondary to patient's metastatic colon cancer -If patient spikes fever, bands, left shift WBCs will panculture.  GERD - Protonix IV 40 mg daily  Generalized anxiety disorder - Ativan PRN      Severe obesity (BMI 41.9 kg/m ) -  Goals of care - 12/21 consult Palliative Care: Patient with metastatic colon cancer, multiple admissions for SBO, evaluate change of CODE STATUS to DNR, discuss goals of care, appointment of HCPOA     DVT prophylaxis: SCD Code Status: partial Family Communication:  Status is: Inpatient    Dispo: The patient is from: Home              Anticipated d/c is to: Home              Anticipated d/c date is: > 3 days              Patient currently is not medically stable to d/c.      Consultants:  General surgery Dr Redmond Pulling   Procedures/Significant Events:     I have personally reviewed and interpreted all radiology studies and my findings are as above.  VENTILATOR SETTINGS:    Cultures   Antimicrobials: Anti-infectives (From admission, onward)    None         Devices    LINES / TUBES:      Continuous Infusions:  sodium chloride     lactated ringers 75 mL/hr at 09/02/21 2223     Objective: Vitals:   09/03/21 0404 09/03/21 0500 09/03/21 0743 09/03/21 0756  BP: (!) 121/94 (!) 124/98 (!) 132/110 (!) 122/92  Pulse: (!) 102 (!) 103 (!) 103   Resp: 18 15 17    Temp:   97.9 F (36.6 C)    TempSrc:   Oral   SpO2: 91% 91% 93%   Weight:      Height:        Intake/Output Summary (Last 24 hours) at 09/03/2021 8119 Last data filed at 09/03/2021 1478 Gross per 24 hour  Intake 1002.92 ml  Output 700 ml  Net 302.92 ml   Filed Weights   09/02/21 1723  Weight: 118 kg    Examination:  General: A/O x4, No acute respiratory distress Eyes: negative scleral hemorrhage, negative anisocoria, negative icterus ENT: Negative Runny nose, negative gingival bleeding, Neck:  Negative scars, masses, torticollis, lymphadenopathy, JVD Lungs: Clear to auscultation bilaterally without wheezes or crackles Cardiovascular: Regular rate and rhythm without murmur gallop or rub normal S1 and S2 Abdomen: Positive abdominal pain, positive severe distention, negative soft, bowel sounds, no rebound, no ascites, no appreciable mass Extremities: No significant cyanosis, clubbing, or edema bilateral lower extremities Skin: Negative rashes, lesions, ulcers Psychiatric:  Negative depression, negative anxiety, negative fatigue, negative mania  Central nervous system:  Cranial nerves II through XII intact, tongue/uvula midline, all extremities muscle strength 5/5, sensation intact throughout, negative dysarthria, negative expressive aphasia, negative receptive aphasia.  .     Data Reviewed: Care during the described time interval was provided by me .  I have reviewed this patient's available data, including medical history, events of note, physical examination, and all test results as part of my evaluation.   CBC: Recent Labs  Lab 08/28/21 0307 08/29/21 0414 09/02/21 1741 09/03/21 0421  WBC 13.7* 16.1* 16.7* 16.5*  NEUTROABS  --   --  13.7* 13.6*  HGB 12.5* 13.1 14.5 13.8  HCT 38.1* 39.0 43.7 42.3  MCV 92.0 88.8 89.5 91.0  PLT 203 224 338 220   Basic Metabolic Panel: Recent Labs  Lab 08/28/21 0307 08/29/21 0414 09/02/21 1741 09/02/21 2035 09/03/21 0421  NA 135 135 143 133* 136  K 3.8  3.7 <2.0* 4.1 4.5  CL 104 101 129* 101 100  CO2 24 25 12* 23 26  GLUCOSE 83 104* 53* 100* 106*  BUN 11 7 11 20  21*  CREATININE 1.30* 1.00 0.46* 1.29* 1.21  CALCIUM 8.2* 8.7* <4.0* 9.0 9.4  MG 1.9  --   --  1.9 2.1   GFR: Estimated Creatinine Clearance: 82.4 mL/min (by C-G formula based on SCr of 1.21 mg/dL). Liver Function Tests: Recent Labs  Lab 09/02/21 1741 09/02/21 2035 09/03/21 0421  AST 13* 29 29  ALT 18 42 43  ALKPHOS 42 101 114  BILITOT 0.4 0.8 0.8  PROT <3.0* 7.5 7.8  ALBUMIN <1.5* 3.5 3.6   Recent Labs  Lab 09/02/21 1741  LIPASE 24   No results for input(s): AMMONIA in the last 168 hours. Coagulation Profile: No results for input(s): INR, PROTIME in the last 168 hours. Cardiac Enzymes: No results for input(s): CKTOTAL, CKMB, CKMBINDEX, TROPONINI in the last 168 hours. BNP (last 3 results) No results for input(s): PROBNP in the last 8760 hours. HbA1C: No results for input(s): HGBA1C in the last 72 hours. CBG: No results for input(s): GLUCAP in the last 168 hours. Lipid Profile: No results for input(s): CHOL, HDL, LDLCALC, TRIG, CHOLHDL, LDLDIRECT in the last 72 hours. Thyroid Function Tests: No results for input(s): TSH, T4TOTAL, FREET4, T3FREE, THYROIDAB in the last 72 hours. Anemia Panel: No results for input(s): VITAMINB12, FOLATE, FERRITIN, TIBC, IRON, RETICCTPCT in the last 72 hours. Urine analysis:    Component Value Date/Time   COLORURINE YELLOW 09/03/2021 0800   APPEARANCEUR CLEAR 09/03/2021 0800   LABSPEC >1.046 (H) 09/03/2021 0800   PHURINE 5.0 09/03/2021 0800   GLUCOSEU NEGATIVE 09/03/2021 0800   HGBUR NEGATIVE 09/03/2021 0800   BILIRUBINUR NEGATIVE 09/03/2021 0800   KETONESUR NEGATIVE 09/03/2021 0800   PROTEINUR NEGATIVE 09/03/2021 0800   NITRITE NEGATIVE 09/03/2021 0800   LEUKOCYTESUR NEGATIVE 09/03/2021 0800   Sepsis Labs: @LABRCNTIP (procalcitonin:4,lacticidven:4)  ) Recent Results (from the past 240 hour(s))  Resp Panel by  RT-PCR (Flu A&B, Covid) Nasopharyngeal Swab     Status: None   Collection Time: 08/27/21  3:00 AM   Specimen: Nasopharyngeal Swab; Nasopharyngeal(NP) swabs in vial transport medium  Result Value Ref Range Status   SARS Coronavirus 2 by RT PCR NEGATIVE NEGATIVE Final    Comment: (NOTE) SARS-CoV-2 target nucleic acids are NOT DETECTED.  The SARS-CoV-2 RNA is generally detectable in upper respiratory specimens during the acute phase of infection. The lowest concentration of SARS-CoV-2 viral copies this assay can detect is 138 copies/mL. A negative result does not preclude SARS-Cov-2  infection and should not be used as the sole basis for treatment or other patient management decisions. A negative result may occur with  improper specimen collection/handling, submission of specimen other than nasopharyngeal swab, presence of viral mutation(s) within the areas targeted by this assay, and inadequate number of viral copies(<138 copies/mL). A negative result must be combined with clinical observations, patient history, and epidemiological information. The expected result is Negative.  Fact Sheet for Patients:  EntrepreneurPulse.com.au  Fact Sheet for Healthcare Providers:  IncredibleEmployment.be  This test is no t yet approved or cleared by the [place] FDA and  has been authorized for detection and/or diagnosis of SARS-CoV-2 by FDA under an Emergency Use Authorization (EUA). This EUA will remain  in effect (meaning this test can be used) for the duration of the COVID-19 declaration under Section 564(b)(1) of the Act, 21 U.S.C.section 360bbb-3(b)(1), unless the authorization is terminated  or revoked sooner.       Influenza A by PCR NEGATIVE NEGATIVE Final   Influenza B by PCR NEGATIVE NEGATIVE Final    Comment: (NOTE) The Xpert Xpress SARS-CoV-2/FLU/RSV plus assay is intended as an aid in the diagnosis of influenza from Nasopharyngeal swab  specimens and should not be used as a sole basis for treatment. Nasal washings and aspirates are unacceptable for Xpert Xpress SARS-CoV-2/FLU/RSV testing.  Fact Sheet for Patients: EntrepreneurPulse.com.au  Fact Sheet for Healthcare Providers: IncredibleEmployment.be  This test is not yet approved or cleared by the [place] FDA and has been authorized for detection and/or diagnosis of SARS-CoV-2 by FDA under an Emergency Use Authorization (EUA). This EUA will remain in effect (meaning this test can be used) for the duration of the COVID-19 declaration under Section 564(b)(1) of the Act, 21 U.S.C. section 360bbb-3(b)(1), unless the authorization is terminated or revoked.  Performed at [facility], [place] [address], [place], [place] [address]   Resp Panel by RT-PCR (Flu A&B, Covid) Nasopharyngeal Swab     Status: None   Collection Time: 09/02/21 10:16 PM   Specimen: Nasopharyngeal Swab; Nasopharyngeal(NP) swabs in vial transport medium  Result Value Ref Range Status   SARS Coronavirus 2 by RT PCR NEGATIVE NEGATIVE Final    Comment: (NOTE) SARS-CoV-2 target nucleic acids are NOT DETECTED.  The SARS-CoV-2 RNA is generally detectable in upper respiratory specimens during the acute phase of infection. The lowest concentration of SARS-CoV-2 viral copies this assay can detect is 138 copies/mL. A negative result does not preclude SARS-Cov-2 infection and should not be used as the sole basis for treatment or other patient management decisions. A negative result may occur with  improper specimen collection/handling, submission of specimen other than nasopharyngeal swab, presence of viral mutation(s) within the areas targeted by this assay, and inadequate number of viral copies(<138 copies/mL). A negative result must be combined with clinical observations, patient history, and epidemiological information. The expected result is  Negative.  Fact Sheet for Patients:  EntrepreneurPulse.com.au  Fact Sheet for Healthcare Providers:  IncredibleEmployment.be  This test is no t yet approved or cleared by the [place] FDA and  has been authorized for detection and/or diagnosis of SARS-CoV-2 by FDA under an Emergency Use Authorization (EUA). This EUA will remain  in effect (meaning this test can be used) for the duration of the COVID-19 declaration under Section 564(b)(1) of the Act, 21 U.S.C.section 360bbb-3(b)(1), unless the authorization is terminated  or revoked sooner.       Influenza A by PCR NEGATIVE NEGATIVE Final   Influenza  B by PCR NEGATIVE NEGATIVE Final    Comment: (NOTE) The Xpert Xpress SARS-CoV-2/FLU/RSV plus assay is intended as an aid in the diagnosis of influenza from Nasopharyngeal swab specimens and should not be used as a sole basis for treatment. Nasal washings and aspirates are unacceptable for Xpert Xpress SARS-CoV-2/FLU/RSV testing.  Fact Sheet for Patients: EntrepreneurPulse.com.au  Fact Sheet for Healthcare Providers: IncredibleEmployment.be  This test is not yet approved or cleared by the [place] FDA and has been authorized for detection and/or diagnosis of SARS-CoV-2 by FDA under an Emergency Use Authorization (EUA). This EUA will remain in effect (meaning this test can be used) for the duration of the COVID-19 declaration under Section 564(b)(1) of the Act, 21 U.S.C. section 360bbb-3(b)(1), unless the authorization is terminated or revoked.  Performed at [facility], [place] [address], [place], [place] [address]          Radiology Studies: DG Abdomen 1 View  Result Date: 09/03/2021 CLINICAL DATA:  Nasogastric tube placement EXAM: ABDOMEN - 1 VIEW COMPARISON:  None. FINDINGS: Nasogastric tube tip overlies the expected mid body of the stomach. Multiple gas-filled dilated  loops of small bowel are seen within the mid abdomen and left upper quadrant. No free intraperitoneal gas. IMPRESSION: Nasogastric tube tip within the mid body of the stomach. Electronically Signed   By: Fidela Salisbury M.D.   On: 09/03/2021 00:51   CT ABDOMEN PELVIS W CONTRAST  Result Date: 09/02/2021 CLINICAL DATA:  Bowel obstruction suspected. Abdominal pain. Chemotherapy. Three weeks ago. EXAM: CT ABDOMEN AND PELVIS WITH CONTRAST TECHNIQUE: Multidetector CT imaging of the abdomen and pelvis was performed using the standard protocol following bolus administration of intravenous contrast. CONTRAST:  55mL OMNIPAQUE IOHEXOL 350 MG/ML SOLN COMPARISON:  CT abdomen and pelvis 08/27/2021. FINDINGS: Lower chest: There is a new trace left pleural effusion. There is atelectasis in the bilateral lung bases. Hepatobiliary: No focal liver abnormality is seen. No gallstones, gallbladder wall thickening, or biliary dilatation. Pancreas: Unremarkable. No pancreatic ductal dilatation or surrounding inflammatory changes. Spleen: Normal in size without focal abnormality. Adrenals/Urinary Tract: Adrenal glands are unremarkable. Kidneys are normal, without renal calculi, focal lesion, or hydronephrosis. Bladder is unremarkable. Stomach/Bowel: There are dilated mid and proximal small bowel loops with air-fluid levels measuring up to 5 cm. Degree of distention has mildly increased. There is also new moderate air-fluid level within the stomach. Transition point is likely in the mid abdomen where there are multiple small bowel loops demonstrating adhesive disease to the anterior abdominal wall. This finding is unchanged. The colon is nondilated. The appendix is not seen. There is no pneumatosis or free air identified. Vascular/Lymphatic: Aortic atherosclerosis. No enlarged abdominal or pelvic lymph nodes. Nonenlarged cardiophrenic and ileocecal lymph nodes are unchanged. Reproductive: Prostate is unremarkable. Other: There are small  fat containing bilateral inguinal hernias. There is a small amount of free fluid in the anterior right abdomen which has mildly increased. There is mesenteric/omental haziness in the left upper quadrant and anterior upper abdomen similar to the prior examination. Musculoskeletal: There are bilateral pars interarticularis defects at L5 without significant listhesis. IMPRESSION: 1. Again seen are findings of small-bowel obstruction. Transition point is likely in the mid abdomen where there are clustered small bowel loops adhered to the anterior abdominal wall. Can not exclude underlying obstructive lesion. Degree of distention has increased when compared to the prior exam. There is now air-fluid level in the stomach. 2. Increasing free fluid in the right abdomen. 3. New small left  pleural effusion. 4. Stable diffuse omental haziness is unchanged, concerning for metastatic disease. Electronically Signed   By: Ronney Asters M.D.   On: 09/02/2021 20:30        Scheduled Meds:  pantoprazole (PROTONIX) IV  40 mg Intravenous Q24H   Continuous Infusions:  sodium chloride     lactated ringers 75 mL/hr at 09/02/21 2223     LOS: 1 day   The patient is critically ill with multiple organ systems failure and requires high complexity decision making for assessment and support, frequent evaluation and titration of therapies, application of advanced monitoring technologies and extensive interpretation of multiple databases. Critical Care Time devoted to patient care services described in this note  Time spent: 40 minutes     Keyshawna Prouse, Geraldo Docker, MD [facility]   If 7PM-7AM, please contact night-coverage 09/03/2021, 9:25 AM

## 2021-09-03 NOTE — ED Notes (Signed)
Verified with Hospitalist, Dr. Sherral Hammers, pt is to receive ice chips alone by mouth at this time. This nurse discussed this with the pt and pt verifies understanding.

## 2021-09-04 ENCOUNTER — Inpatient Hospital Stay: Payer: BC Managed Care – PPO

## 2021-09-04 ENCOUNTER — Inpatient Hospital Stay (HOSPITAL_COMMUNITY): Payer: BC Managed Care – PPO

## 2021-09-04 DIAGNOSIS — C189 Malignant neoplasm of colon, unspecified: Secondary | ICD-10-CM | POA: Diagnosis not present

## 2021-09-04 DIAGNOSIS — F411 Generalized anxiety disorder: Secondary | ICD-10-CM | POA: Diagnosis not present

## 2021-09-04 DIAGNOSIS — R1084 Generalized abdominal pain: Secondary | ICD-10-CM | POA: Diagnosis not present

## 2021-09-04 LAB — GLUCOSE, CAPILLARY
Glucose-Capillary: 107 mg/dL — ABNORMAL HIGH (ref 70–99)
Glucose-Capillary: 108 mg/dL — ABNORMAL HIGH (ref 70–99)

## 2021-09-04 LAB — CBC WITH DIFFERENTIAL/PLATELET
Abs Immature Granulocytes: 0.08 10*3/uL — ABNORMAL HIGH (ref 0.00–0.07)
Basophils Absolute: 0.1 10*3/uL (ref 0.0–0.1)
Basophils Relative: 0 %
Eosinophils Absolute: 0.1 10*3/uL (ref 0.0–0.5)
Eosinophils Relative: 1 %
HCT: 39.8 % (ref 39.0–52.0)
Hemoglobin: 13 g/dL (ref 13.0–17.0)
Immature Granulocytes: 1 %
Lymphocytes Relative: 7 %
Lymphs Abs: 0.9 10*3/uL (ref 0.7–4.0)
MCH: 30 pg (ref 26.0–34.0)
MCHC: 32.7 g/dL (ref 30.0–36.0)
MCV: 91.7 fL (ref 80.0–100.0)
Monocytes Absolute: 1.2 10*3/uL — ABNORMAL HIGH (ref 0.1–1.0)
Monocytes Relative: 9 %
Neutro Abs: 10.5 10*3/uL — ABNORMAL HIGH (ref 1.7–7.7)
Neutrophils Relative %: 82 %
Platelets: 249 10*3/uL (ref 150–400)
RBC: 4.34 MIL/uL (ref 4.22–5.81)
RDW: 15.1 % (ref 11.5–15.5)
WBC: 12.8 10*3/uL — ABNORMAL HIGH (ref 4.0–10.5)
nRBC: 0 % (ref 0.0–0.2)

## 2021-09-04 LAB — PHOSPHORUS: Phosphorus: 3.3 mg/dL (ref 2.5–4.6)

## 2021-09-04 LAB — MAGNESIUM: Magnesium: 2 mg/dL (ref 1.7–2.4)

## 2021-09-04 MED ORDER — LACTATED RINGERS IV SOLN
INTRAVENOUS | Status: AC
Start: 2021-09-04 — End: 2021-09-05

## 2021-09-04 MED ORDER — PHENOL 1.4 % MT LIQD
1.0000 | OROMUCOSAL | Status: DC | PRN
  Filled 2021-09-04: qty 177

## 2021-09-04 MED ORDER — INSULIN ASPART 100 UNIT/ML IJ SOLN: 0.0000 [IU] | INTRAMUSCULAR | Status: DC

## 2021-09-04 MED ORDER — SODIUM CHLORIDE 0.9% FLUSH
10.0000 mL | INTRAVENOUS | Status: DC | PRN
  Administered 2021-09-05 – 2021-09-12 (×2): 10 mL

## 2021-09-04 MED ORDER — TRAVASOL 10 % IV SOLN
INTRAVENOUS | Status: AC
  Filled 2021-09-04: qty 480

## 2021-09-04 NOTE — Progress Notes (Addendum)
Initial Nutrition Assessment  DOCUMENTATION CODES:   Obesity unspecified  INTERVENTION:   Monitor magnesium, potassium, and phosphorus BID for at least 3 days, MD to replete as needed, as pt is at risk for refeeding syndrome.  -TPN management per Pharmacy  -Monitor for diet advancement -Supplements when able (CIB, Ensure)  NUTRITION DIAGNOSIS:   Increased nutrient needs related to chronic illness, cancer and cancer related treatments as evidenced by estimated needs.  GOAL:   Patient will meet greater than or equal to 90% of their needs  MONITOR:   Diet advancement, Labs, Weight trends, I & O's (TPN)  REASON FOR ASSESSMENT:   Consult New TPN/TNA  ASSESSMENT:   55 y.o. 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  Patient in room, no family at bedside at time of visit. Pt reports he last ate an arepa his wife made him (similar to a pancake) on 12/18. This was the last solid food he has eaten. Pt has struggled to eat enough since stopping chemo (in August). Pt states he feels like solids and liquids sit in his stomach and if he sneezes or coughs he would vomit contents up.   Pt tries to drink 16 oz of Carnation Instant Breakfast mixed with 2% milk every morning. Drinks a immunity drink, as well as Ensure. Mainly subsisting on well cooked chicken, pasta. Learned he cannot tolerate broccoli or corn (which these foods are not compliant with low fiber diet). Ate a Mr. Donell Sievert recently which contains peanuts. Pt suspected to not be completely compliant with low fiber diet but this may be from a lack of education.  Per chart review, pt will likely discharge on a full liquid diet. Will  provide information in discharge instructions as a guide. Will follow-up about this if pt is still here next week.   Pt with NGT, set back to LIS. Output: >500 ml at bedside. Plan is for patient to have venting G-tube placed.  TPN to start today at 40 ml/hr (providing 921 kcals and 48g protein).  Pt sipping on sweet tea as he is on clears.  Per weight records, no recent weight loss noted.  Medications: Lactated Ringers  Labs reviewed.  NUTRITION - FOCUSED PHYSICAL EXAM:  No depletions noted.  Diet Order:   Diet Order             Diet clear liquid Room service appropriate? Yes; Fluid consistency: Thin  Diet effective now                   EDUCATION NEEDS:   Education needs have been addressed  Skin:  Skin Assessment: Reviewed RN Assessment  Last BM:  12/22 -type 3  Height:   Ht Readings from Last 1 Encounters:  09/03/21 5\' 11"  (1.803 m)    Weight:   Wt Readings from Last 1 Encounters:  09/04/21 116.4 kg    BMI:  Body mass index is 35.79 kg/m.  Estimated Nutritional Needs:   Kcal:  2200-2400  Protein:  115-125g  Fluid:  2,2L/day   Clayton Bibles, MS, RD, LDN Inpatient Clinical Dietitian Contact information available via Amion

## 2021-09-04 NOTE — Progress Notes (Signed)
Patient ID: Ricardo [name], male   DOB: [birth year], 55 y.o.   MRN: [id number] [facility] Surgery Progress Note     Subjective: CC-  Feeling much better. Passing flatus and had a large BM this morning. Feels like he is going to have another BM. NG tube output not recorded.  Objective: Vital signs in last 24 hours: Temp:  [97.6 F (36.4 C)-98.9 F (37.2 C)] 98.3 F (36.8 C) (12/22 0501) Pulse Rate:  [98-110] 104 (12/22 0501) Resp:  [16-20] 18 (12/22 0501) BP: (105-136)/(74-92) 105/74 (12/22 0501) SpO2:  [91 %-95 %] 93 % (12/22 0501) Weight:  [116.4 kg] 116.4 kg (12/22 0500) Last BM Date: 09/04/21  Intake/Output from previous day: 12/21 0701 - 12/22 0700 In: 2249.5 [I.V.:2249.5] Out: -  Intake/Output this shift: No intake/output data recorded.  PE: Gen:  Alert, NAD, pleasant Pulm: rate and effort normal Abd: Soft, mild distension, nontender, few BS heard  Lab Results:  Recent Labs    09/03/21 0421 09/04/21 0457  WBC 16.5* 12.8*  HGB 13.8 13.0  HCT 42.3 39.8  PLT 273 249   BMET Recent Labs    09/03/21 0421 09/03/21 1009  NA 136 136  K 4.5 4.4  CL 100 100  CO2 26 26  GLUCOSE 106* 100*  BUN 21* 20  CREATININE 1.21 1.15  CALCIUM 9.4 9.3   PT/INR No results for input(s): LABPROT, INR in the last 72 hours. CMP     Component Value Date/Time   NA 136 09/03/2021 1009   NA 138 09/04/2020 0911   K 4.4 09/03/2021 1009   CL 100 09/03/2021 1009   CO2 26 09/03/2021 1009   GLUCOSE 100 (H) 09/03/2021 1009   BUN 20 09/03/2021 1009   BUN 15 09/04/2020 0911   CREATININE 1.15 09/03/2021 1009   CREATININE 1.21 08/14/2021 1000   CALCIUM 9.3 09/03/2021 1009   PROT 7.7 09/03/2021 1009   PROT 7.3 09/04/2020 0911   ALBUMIN 3.6 09/03/2021 1009   ALBUMIN 4.6 09/04/2020 0911   AST 25 09/03/2021 1009   AST 14 (L) 08/14/2021 1000   ALT 42 09/03/2021 1009   ALT 21 08/14/2021 1000   ALKPHOS 107 09/03/2021 1009   BILITOT 0.9 09/03/2021 1009   BILITOT 0.2 (L)  08/14/2021 1000   GFRNONAA >60 09/03/2021 1009   GFRNONAA >60 08/14/2021 1000   GFRAA 88 09/04/2020 0911   Lipase     Component Value Date/Time   LIPASE 24 09/02/2021 1741       Studies/Results: DG Abdomen 1 View  Result Date: 09/03/2021 CLINICAL DATA:  Nasogastric tube placement EXAM: ABDOMEN - 1 VIEW COMPARISON:  None. FINDINGS: Nasogastric tube tip overlies the expected mid body of the stomach. Multiple gas-filled dilated loops of small bowel are seen within the mid abdomen and left upper quadrant. No free intraperitoneal gas. IMPRESSION: Nasogastric tube tip within the mid body of the stomach. Electronically Signed   By: Fidela Salisbury M.D.   On: 09/03/2021 00:51   CT ABDOMEN PELVIS W CONTRAST  Result Date: 09/02/2021 CLINICAL DATA:  Bowel obstruction suspected. Abdominal pain. Chemotherapy. Three weeks ago. EXAM: CT ABDOMEN AND PELVIS WITH CONTRAST TECHNIQUE: Multidetector CT imaging of the abdomen and pelvis was performed using the standard protocol following bolus administration of intravenous contrast. CONTRAST:  31mL OMNIPAQUE IOHEXOL 350 MG/ML SOLN COMPARISON:  CT abdomen and pelvis 08/27/2021. FINDINGS: Lower chest: There is a new trace left pleural effusion. There is atelectasis in the bilateral lung bases. Hepatobiliary: No focal liver  abnormality is seen. No gallstones, gallbladder wall thickening, or biliary dilatation. Pancreas: Unremarkable. No pancreatic ductal dilatation or surrounding inflammatory changes. Spleen: Normal in size without focal abnormality. Adrenals/Urinary Tract: Adrenal glands are unremarkable. Kidneys are normal, without renal calculi, focal lesion, or hydronephrosis. Bladder is unremarkable. Stomach/Bowel: There are dilated mid and proximal small bowel loops with air-fluid levels measuring up to 5 cm. Degree of distention has mildly increased. There is also new moderate air-fluid level within the stomach. Transition point is likely in the mid abdomen  where there are multiple small bowel loops demonstrating adhesive disease to the anterior abdominal wall. This finding is unchanged. The colon is nondilated. The appendix is not seen. There is no pneumatosis or free air identified. Vascular/Lymphatic: Aortic atherosclerosis. No enlarged abdominal or pelvic lymph nodes. Nonenlarged cardiophrenic and ileocecal lymph nodes are unchanged. Reproductive: Prostate is unremarkable. Other: There are small fat containing bilateral inguinal hernias. There is a small amount of free fluid in the anterior right abdomen which has mildly increased. There is mesenteric/omental haziness in the left upper quadrant and anterior upper abdomen similar to the prior examination. Musculoskeletal: There are bilateral pars interarticularis defects at L5 without significant listhesis. IMPRESSION: 1. Again seen are findings of small-bowel obstruction. Transition point is likely in the mid abdomen where there are clustered small bowel loops adhered to the anterior abdominal wall. Can not exclude underlying obstructive lesion. Degree of distention has increased when compared to the prior exam. There is now air-fluid level in the stomach. 2. Increasing free fluid in the right abdomen. 3. New small left pleural effusion. 4. Stable diffuse omental haziness is unchanged, concerning for metastatic disease. Electronically Signed   By: Ronney Asters M.D.   On: 09/02/2021 20:30   DG Abd Portable 1V-Small Bowel Obstruction Protocol-initial, 8 hr delay  Result Date: 09/03/2021 CLINICAL DATA:  Small-bowel obstruction. EXAM: PORTABLE ABDOMEN - 1 VIEW COMPARISON:  09/03/2021 FINDINGS: Nasogastric tube overlies expected mid body of the stomach. Contrast is seen within the gastric fundus. There has been little antegrade passage of contrast into the remainder of the abdomen. Multiple loops of gas-filled dilated small bowel are seen within the epigastrium and left abdomen in keeping with changes of a mid  small bowel obstruction. No gross free intraperitoneal gas. No organomegaly. IMPRESSION: No significant antegrade passage of contrast from the gastric lumen. Persistent findings in keeping with a mid small bowel obstruction. Electronically Signed   By: Fidela Salisbury M.D.   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 [name] on 11/29/2020 and laparoscopy by Dr. Clovis [facility] at [facility] on 05[facility]13/2021 and was found to have extensive peritoneal metastasis - determined not to be a candidate for HIPEC - no surgical options at this point - on current chemo, followed by Dr. Burr Medico - palliative team following, consult note is pending - Obstruction seems to be resolving again. Will clamp NG tube and give clear liquids. Ultimately we would recommend that he stay on a liquid diet long term. If he continues to have recurrent malignant obstructions would recommend discussing the possibility of a venting G tube with IR.   FEN - IVF per [facility], clamp NGT, CLD VTE - SCDs, okay for chemical prophylaxis from a general surgery standpoint  ID - None   LOS: 2 days    Wellington Hampshire, [facility] Surgery 09/04/2021, 9:00 AM Please see Amion for pager number during day hours 7:00am-4:30pm

## 2021-09-04 NOTE — Progress Notes (Signed)
PROGRESS NOTE    EULAN HEYWARD  HKV:[id number] DOB: 12-30-1965 DOA: 09/02/2021 PCP: [name], MD   Brief Narrative:  55 y.o. 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    The patient was recently hospitalized at [facility] from 08/28/2019 to 08/29/2021 for partial small bowel obstruction, subsequently improved with conservative measures, following which had a bowel movement prior to discharge to home on 08/29/2021.  However, over the last 2 to 3 days, he has noted progressive generalized abdominal discomfort, which he describes as sharp to crampy pain, along with worsening abdominal distention over that time.  Pain has been constant over the last day, worsening with palpation of the abdomen as well as any attempts to eat or drink anything.  This is been associated with nausea/vomiting, with the patient noting that he vomits within a few seconds to a minute and attempting to eat or drink anything over the last 1 to 2 days.  Emesis associated with nonbloody findings, no coffee-ground appearance.  Most recent s bowel movement occurred on 09/02/2021, and he has subsequently noted diminished production of flatus.  Reports that this constellation of symptoms are very similar to that which she is experiencing in the days leading up to this most recent previous hospitalization for small bowel obstruction.  Denies any associated subjective fever, chills, rigors, generalized myalgias.  Denies any associated chest pain, palpitations, diaphoresis.  No recent trauma.   In the setting history of metastatic colon cancer, he follows with Dr. Burr Medico of Heme-onc.       ED Course:  Vital signs in the ED were  notable for the following: Temperature max 98.0; heart rate initially 126, which decreased 22 following interval administration of IV fluids, blood pressure 112/94 -138/92; respiratory rate 17-20, oxygen saturation 94 to 98% on room air.   Labs were notable for the following: CMP notable for the following: Bicarbonate 23, creatinine 1.29 compared to most recent prior creatinine data point of 1.0 on 08/29/2021, liver enzymes within normal limits.  High-sensitivity troponin I.  Before.  CBC notable for the following: White blood cell count 16,500 compared to 16,700 on 08/29/2021, platelet count 273.  COVID-19/[place] PCR found to be negative.   Imaging and additional notable ED work-up: CT abdomen/pelvis with contrast suggestive of small bowel obstruction, with transition point likely in the mid abdomen, where there are clustered small bowel loops adhered to the anterior abdominal wall, with air-fluid level noted in the stomach.   EDP discussed the patient's case with the on-call general surgeon, Dr. Redmond Pulling, who recommended admission to the hospital service for further evaluation management of small bowel obstruction, including initiation of conservative measures along with NG tube, with plan for Dr. Redmond Pulling to formally consult and see the patient in the morning.    NG tube placed in the ED, and subsequently attached to low intermittent wall suction.      Subjective: 12/22 A/O x4, states had BM x2 today.  Much more comfortable, abdomen less extended.  0 nausea with clear liquid diet    Assessment & Plan:  Covid vaccination;  Principal Problem:   SBO (small bowel obstruction) (HCC) Active Problems:   Nausea and vomiting   AKI (acute kidney injury) ([place])  Abdominal pain   Leukocytosis   GERD (gastroesophageal reflux disease)   GAD (generalized anxiety disorder)  SBO secondary to metastatic colon cancer --12/21 surgery recommendation:recommend SBO protocol and pt may need to stay on liquid  diet long term if SBO resolves. If obstruction does not resolve would recommend placement of venting gastrostomy vs hospice. If patient elects for gastrostomy, IR would potentially be able to place given gastropexy at time of surgery in March of this year -12/22 discuss case with Dr. Burr Medico oncology and she agrees patient will have to remain on liquid diet going forward.  If SBO does not fully resolve may still require G-tube placement by IR - 12/22 begin TPN to meet patient's nutrition requirements -12/22 after patient recovers Dr.Yan Burr Medico plans on continuing chemotherapy -12/22 KUB appears SBO resolved see results below   AKI Lab Results  Component Value Date   CREATININE 1.15 09/03/2021   CREATININE 1.21 09/03/2021   CREATININE 1.29 (H) 09/02/2021   CREATININE 0.46 (L) 09/02/2021   CREATININE 1.00 08/29/2021  -12/21 resolved with hydration   Leukocytosis - No evidence of active infection most likely secondary to patient's metastatic colon cancer -If patient spikes fever, bands, left shift WBCs will panculture. -12/22 still elevated but downtrending  GERD - Protonix IV 40 mg daily  Generalized anxiety disorder - Ativan PRN      Severe obesity (BMI 41.9 kg/m ) -  Goals of care - 12/21 consult Palliative Care: Patient with metastatic colon cancer, multiple admissions for SBO, evaluate change of CODE STATUS to DNR, discuss goals of care, appointment of HCPOA     DVT prophylaxis: SCD Code Status: partial Family Communication:  Status is: Inpatient    Dispo: The patient is from: Home              Anticipated d/c is to: Home              Anticipated d/c date is: > 3 days              Patient currently is not medically stable to d/c.      Consultants:  General surgery Dr Redmond Pulling Oncology Dr. [name]  Procedures/Significant Events:  12/22 [id number] passage of contrast into decompressed colon with improved small bowel dilatation. No evidence of bowel obstruction  or perforation.   I have personally reviewed and interpreted all radiology studies and my findings are as above.  VENTILATOR SETTINGS:    Cultures   Antimicrobials: Anti-infectives (From admission, onward)    None         Devices    LINES / TUBES:      Continuous Infusions:  lactated ringers 75 mL/hr at 09/04/21 0330   lactated ringers     TPN ADULT (ION)       Objective: Vitals:   09/04/21 0415 09/04/21 0500 09/04/21 0501 09/04/21 1432  BP:   105/74 135/83  Pulse:   (!) 104 (!) 102  Resp: 18  18 16   Temp:   98.3 F (36.8 C) 98 F (36.7 C)  TempSrc:   Oral Oral  SpO2:   93% 95%  Weight:  116.4 kg    Height:        Intake/Output Summary (Last 24 hours) at 09/04/2021 1657 Last data filed at 09/04/2021 1427 Gross per 24 hour  Intake 2249.54 ml  Output 900 ml  Net 1349.54 ml    Filed Weights   09/02/21 1723 09/04/21 0500  Weight: 118 kg 116.4 kg  Examination:  General: A/O x4, No acute respiratory distress Eyes: negative scleral hemorrhage, negative anisocoria, negative icterus ENT: Negative Runny nose, negative gingival bleeding, Neck:  Negative scars, masses, torticollis, lymphadenopathy, JVD Lungs: Clear to auscultation bilaterally without wheezes or crackles Cardiovascular: Regular rate and rhythm without murmur gallop or rub normal S1 and S2 Abdomen: Negative abdominal pain, decreased distention, positive soft, bowel sounds, no rebound, no ascites, no appreciable mass Extremities: No significant cyanosis, clubbing, or edema bilateral lower extremities Skin: Negative rashes, lesions, ulcers Psychiatric:  Negative depression, negative anxiety, negative fatigue, negative mania  Central nervous system:  Cranial nerves II through XII intact, tongue/uvula midline, all extremities muscle strength 5/5, sensation intact throughout, negative dysarthria, negative expressive aphasia, negative receptive aphasia.  .     Data Reviewed: Care  during the described time interval was provided by me .  I have reviewed this patient's available data, including medical history, events of note, physical examination, and all test results as part of my evaluation.   CBC: Recent Labs  Lab 08/29/21 0414 09/02/21 1741 09/03/21 0421 09/04/21 0457  WBC 16.1* 16.7* 16.5* 12.8*  NEUTROABS  --  13.7* 13.6* 10.5*  HGB 13.1 14.5 13.8 13.0  HCT 39.0 43.7 42.3 39.8  MCV 88.8 89.5 91.0 91.7  PLT 224 338 273 449    Basic Metabolic Panel: Recent Labs  Lab 08/29/21 0414 09/02/21 1741 09/02/21 2035 09/03/21 0421 09/03/21 1009 09/04/21 0457  NA 135 143 133* 136 136  --   K 3.7 <2.0* 4.1 4.5 4.4  --   CL 101 129* 101 100 100  --   CO2 25 12* 23 26 26   --   GLUCOSE 104* 53* 100* 106* 100*  --   BUN 7 11 20  21* 20  --   CREATININE 1.00 0.46* 1.29* 1.21 1.15  --   CALCIUM 8.7* <4.0* 9.0 9.4 9.3  --   MG  --   --  1.9 2.1  --  2.0  PHOS  --   --   --   --   --  3.3    GFR: Estimated Creatinine Clearance: 93 mL/min (by C-G formula based on SCr of 1.15 mg/dL). Liver Function Tests: Recent Labs  Lab 09/02/21 1741 09/02/21 2035 09/03/21 0421 09/03/21 1009  AST 13* 29 29 25   ALT 18 42 43 42  ALKPHOS 42 101 114 107  BILITOT 0.4 0.8 0.8 0.9  PROT <3.0* 7.5 7.8 7.7  ALBUMIN <1.5* 3.5 3.6 3.6    Recent Labs  Lab 09/02/21 1741  LIPASE 24    No results for input(s): AMMONIA in the last 168 hours. Coagulation Profile: No results for input(s): INR, PROTIME in the last 168 hours. Cardiac Enzymes: No results for input(s): CKTOTAL, CKMB, CKMBINDEX, TROPONINI in the last 168 hours. BNP (last 3 results) No results for input(s): PROBNP in the last 8760 hours. HbA1C: No results for input(s): HGBA1C in the last 72 hours. CBG: Recent Labs  Lab 09/04/21 1628  GLUCAP 107*   Lipid Profile: No results for input(s): CHOL, HDL, LDLCALC, TRIG, CHOLHDL, LDLDIRECT in the last 72 hours. Thyroid Function Tests: No results for input(s): TSH,  T4TOTAL, FREET4, T3FREE, THYROIDAB in the last 72 hours. Anemia Panel: No results for input(s): VITAMINB12, FOLATE, FERRITIN, TIBC, IRON, RETICCTPCT in the last 72 hours. Urine analysis:    Component Value Date/Time   COLORURINE YELLOW 09/03/2021 0800   APPEARANCEUR CLEAR 09/03/2021 0800   LABSPEC >1.046 (H) 09/03/2021 0800   PHURINE 5.0  09/03/2021 0800   GLUCOSEU NEGATIVE 09/03/2021 0800   HGBUR NEGATIVE 09/03/2021 0800   BILIRUBINUR NEGATIVE 09/03/2021 0800   KETONESUR NEGATIVE 09/03/2021 0800   PROTEINUR NEGATIVE 09/03/2021 0800   NITRITE NEGATIVE 09/03/2021 0800   LEUKOCYTESUR NEGATIVE 09/03/2021 0800   Sepsis Labs: @LABRCNTIP (procalcitonin:4,lacticidven:4)  ) Recent Results (from the past 240 hour(s))  Resp Panel by RT-PCR (Flu A&B, Covid) Nasopharyngeal Swab     Status: None   Collection Time: 08/27/21  3:00 AM   Specimen: Nasopharyngeal Swab; Nasopharyngeal(NP) swabs in vial transport medium  Result Value Ref Range Status   SARS Coronavirus 2 by RT PCR NEGATIVE NEGATIVE Final    Comment: (NOTE) SARS-CoV-2 target nucleic acids are NOT DETECTED.  The SARS-CoV-2 RNA is generally detectable in upper respiratory specimens during the acute phase of infection. The lowest concentration of SARS-CoV-2 viral copies this assay can detect is 138 copies/mL. A negative result does not preclude SARS-Cov-2 infection and should not be used as the sole basis for treatment or other patient management decisions. A negative result may occur with  improper specimen collection/handling, submission of specimen other than nasopharyngeal swab, presence of viral mutation(s) within the areas targeted by this assay, and inadequate number of viral copies(<138 copies/mL). A negative result must be combined with clinical observations, patient history, and epidemiological information. The expected result is Negative.  Fact Sheet for Patients:  EntrepreneurPulse.com.au  Fact Sheet  for Healthcare Providers:  IncredibleEmployment.be  This test is no t yet approved or cleared by the [place] FDA and  has been authorized for detection and/or diagnosis of SARS-CoV-2 by FDA under an Emergency Use Authorization (EUA). This EUA will remain  in effect (meaning this test can be used) for the duration of the COVID-19 declaration under Section 564(b)(1) of the Act, 21 U.S.C.section 360bbb-3(b)(1), unless the authorization is terminated  or revoked sooner.       Influenza A by PCR NEGATIVE NEGATIVE Final   Influenza B by PCR NEGATIVE NEGATIVE Final    Comment: (NOTE) The Xpert Xpress SARS-CoV-2/FLU/RSV plus assay is intended as an aid in the diagnosis of influenza from Nasopharyngeal swab specimens and should not be used as a sole basis for treatment. Nasal washings and aspirates are unacceptable for Xpert Xpress SARS-CoV-2/FLU/RSV testing.  Fact Sheet for Patients: EntrepreneurPulse.com.au  Fact Sheet for Healthcare Providers: IncredibleEmployment.be  This test is not yet approved or cleared by the [place] FDA and has been authorized for detection and/or diagnosis of SARS-CoV-2 by FDA under an Emergency Use Authorization (EUA). This EUA will remain in effect (meaning this test can be used) for the duration of the COVID-19 declaration under Section 564(b)(1) of the Act, 21 U.S.C. section 360bbb-3(b)(1), unless the authorization is terminated or revoked.  Performed at [facility], [place] [address] [place], [place] [address]   Resp Panel by RT-PCR (Flu A&B, Covid) Nasopharyngeal Swab     Status: None   Collection Time: 09/02/21 10:16 PM   Specimen: Nasopharyngeal Swab; Nasopharyngeal(NP) swabs in vial transport medium  Result Value Ref Range Status   SARS Coronavirus 2 by RT PCR NEGATIVE NEGATIVE Final    Comment: (NOTE) SARS-CoV-2 target nucleic acids are NOT DETECTED.  The  SARS-CoV-2 RNA is generally detectable in upper respiratory specimens during the acute phase of infection. The lowest concentration of SARS-CoV-2 viral copies this assay can detect is 138 copies/mL. A negative result does not preclude SARS-Cov-2 infection and should not be used as the sole basis for treatment or other  patient management decisions. A negative result may occur with  improper specimen collection/handling, submission of specimen other than nasopharyngeal swab, presence of viral mutation(s) within the areas targeted by this assay, and inadequate number of viral copies(<138 copies/mL). A negative result must be combined with clinical observations, patient history, and epidemiological information. The expected result is Negative.  Fact Sheet for Patients:  EntrepreneurPulse.com.au  Fact Sheet for Healthcare Providers:  IncredibleEmployment.be  This test is no t yet approved or cleared by the [place] FDA and  has been authorized for detection and/or diagnosis of SARS-CoV-2 by FDA under an Emergency Use Authorization (EUA). This EUA will remain  in effect (meaning this test can be used) for the duration of the COVID-19 declaration under Section 564(b)(1) of the Act, 21 U.S.C.section 360bbb-3(b)(1), unless the authorization is terminated  or revoked sooner.       Influenza A by PCR NEGATIVE NEGATIVE Final   Influenza B by PCR NEGATIVE NEGATIVE Final    Comment: (NOTE) The Xpert Xpress SARS-CoV-2/FLU/RSV plus assay is intended as an aid in the diagnosis of influenza from Nasopharyngeal swab specimens and should not be used as a sole basis for treatment. Nasal washings and aspirates are unacceptable for Xpert Xpress SARS-CoV-2/FLU/RSV testing.  Fact Sheet for Patients: EntrepreneurPulse.com.au  Fact Sheet for Healthcare Providers: IncredibleEmployment.be  This test is not yet approved or  cleared by the [place] FDA and has been authorized for detection and/or diagnosis of SARS-CoV-2 by FDA under an Emergency Use Authorization (EUA). This EUA will remain in effect (meaning this test can be used) for the duration of the COVID-19 declaration under Section 564(b)(1) of the Act, 21 U.S.C. section 360bbb-3(b)(1), unless the authorization is terminated or revoked.  Performed at [facility], [place] [address] [place], [place] [address]          Radiology Studies: DG Abdomen 1 View  Result Date: 09/03/2021 CLINICAL DATA:  Nasogastric tube placement EXAM: ABDOMEN - 1 VIEW COMPARISON:  None. FINDINGS: Nasogastric tube tip overlies the expected mid body of the stomach. Multiple gas-filled dilated loops of small bowel are seen within the mid abdomen and left upper quadrant. No free intraperitoneal gas. IMPRESSION: Nasogastric tube tip within the mid body of the stomach. Electronically Signed   By: Fidela Salisbury M.D.   On: 09/03/2021 00:51   CT ABDOMEN PELVIS W CONTRAST  Result Date: 09/02/2021 CLINICAL DATA:  Bowel obstruction suspected. Abdominal pain. Chemotherapy. Three weeks ago. EXAM: CT ABDOMEN AND PELVIS WITH CONTRAST TECHNIQUE: Multidetector CT imaging of the abdomen and pelvis was performed using the standard protocol following bolus administration of intravenous contrast. CONTRAST:  50mL OMNIPAQUE IOHEXOL 350 MG/ML SOLN COMPARISON:  CT abdomen and pelvis 08/27/2021. FINDINGS: Lower chest: There is a new trace left pleural effusion. There is atelectasis in the bilateral lung bases. Hepatobiliary: No focal liver abnormality is seen. No gallstones, gallbladder wall thickening, or biliary dilatation. Pancreas: Unremarkable. No pancreatic ductal dilatation or surrounding inflammatory changes. Spleen: Normal in size without focal abnormality. Adrenals/Urinary Tract: Adrenal glands are unremarkable. Kidneys are normal, without renal calculi, focal lesion, or  hydronephrosis. Bladder is unremarkable. Stomach/Bowel: There are dilated mid and proximal small bowel loops with air-fluid levels measuring up to 5 cm. Degree of distention has mildly increased. There is also new moderate air-fluid level within the stomach. Transition point is likely in the mid abdomen where there are multiple small bowel loops demonstrating adhesive disease to the anterior abdominal wall. This finding is unchanged. The colon  is nondilated. The appendix is not seen. There is no pneumatosis or free air identified. Vascular/Lymphatic: Aortic atherosclerosis. No enlarged abdominal or pelvic lymph nodes. Nonenlarged cardiophrenic and ileocecal lymph nodes are unchanged. Reproductive: Prostate is unremarkable. Other: There are small fat containing bilateral inguinal hernias. There is a small amount of free fluid in the anterior right abdomen which has mildly increased. There is mesenteric/omental haziness in the left upper quadrant and anterior upper abdomen similar to the prior examination. Musculoskeletal: There are bilateral pars interarticularis defects at L5 without significant listhesis. IMPRESSION: 1. Again seen are findings of small-bowel obstruction. Transition point is likely in the mid abdomen where there are clustered small bowel loops adhered to the anterior abdominal wall. Can not exclude underlying obstructive lesion. Degree of distention has increased when compared to the prior exam. There is now air-fluid level in the stomach. 2. Increasing free fluid in the right abdomen. 3. New small left pleural effusion. 4. Stable diffuse omental haziness is unchanged, concerning for metastatic disease. Electronically Signed   By: Ronney Asters M.D.   On: 09/02/2021 20:30   DG Abd Portable 1V-Small Bowel Obstruction Protocol-24 hr delay  Result Date: 09/04/2021 CLINICAL DATA:  Small-bowel obstruction. Bowel obstruction protocol/24 delay image. EXAM: PORTABLE ABDOMEN - 1 VIEW COMPARISON:   Radiographs 09/03/2021 and 08/27/2021.  CT 09/02/2021. FINDINGS: 1120 hours. Two views obtained. Tip of the nasogastric tube projects over the distal stomach. The enteric contrast has passed into the colon which appears decompressed. Previously noted small bowel distension is improved. No extraluminal contrast or air collections are identified. Telemetry leads overlie the chest and upper abdomen. IMPRESSION: Antegrade passage of contrast into decompressed colon with improved small bowel dilatation. No evidence of bowel obstruction or perforation. Electronically Signed   By: Richardean Sale M.D.   On: 09/04/2021 13:43   DG Abd Portable 1V-Small Bowel Obstruction Protocol-initial, 8 hr delay  Result Date: 09/03/2021 CLINICAL DATA:  Small-bowel obstruction. EXAM: PORTABLE ABDOMEN - 1 VIEW COMPARISON:  09/03/2021 FINDINGS: Nasogastric tube overlies expected mid body of the stomach. Contrast is seen within the gastric fundus. There has been little antegrade passage of contrast into the remainder of the abdomen. Multiple loops of gas-filled dilated small bowel are seen within the epigastrium and left abdomen in keeping with changes of a mid small bowel obstruction. No gross free intraperitoneal gas. No organomegaly. IMPRESSION: No significant antegrade passage of contrast from the gastric lumen. Persistent findings in keeping with a mid small bowel obstruction. Electronically Signed   By: Fidela Salisbury M.D.   On: 09/03/2021 19:48        Scheduled Meds:  Chlorhexidine Gluconate Cloth  6 each Topical Daily   insulin aspart  0-9 Units Subcutaneous Q4H   pantoprazole (PROTONIX) IV  40 mg Intravenous Q24H   Continuous Infusions:  lactated ringers 75 mL/hr at 09/04/21 0330   lactated ringers     TPN ADULT (ION)       LOS: 2 days   The patient is critically ill with multiple organ systems failure and requires high complexity decision making for assessment and support, frequent evaluation and titration of  therapies, application of advanced monitoring technologies and extensive interpretation of multiple databases. Critical Care Time devoted to patient care services described in this note  Time spent: 40 minutes     Stephannie Broner, Geraldo Docker, MD [facility]   If 7PM-7AM, please contact night-coverage 09/04/2021, 4:57 PM

## 2021-09-04 NOTE — Progress Notes (Signed)
PHARMACY - TOTAL PARENTERAL NUTRITION CONSULT NOTE   Indication:  Prolonged malnutrition due to  obstruction  Patient Measurements: Height: 5\' 11"  (180.3 cm) Weight: 116.4 kg (256 lb 9.6 oz) IBW/kg (Calculated) : 75.3 TPN AdjBW (KG): 77.3 Body mass index is 35.79 kg/m.   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 Oncology recommending TPN due to unlikely to tolerate po nutrition and likely need for venting G-tube.   Glucose / Insulin: no h/o DM; sensitive SSI with TPN for now Electrolytes: WNL, UOP unmeasured Renal: SCr 1.15 Hepatic: LFTs and T bili WNL Intake / Output; MIVF: I/O not completely recorded; LR @ 75 ml/hr GI Imaging: CTAP 09/02/21 findings consistent with small-bowel obstruction, free fluid rt abdomen, small l pleural effusion, stable metastatic disease GI Surgeries / Procedures:  5/20 lap appy 3/22 ex lap  w/ partial bowel resection and ostomy creation 8/22 diagnostic laprascopy w/ peritoneal biopsise Central access:  implanted port 12/12/20 TPN start date: 09/04/21  Nutritional Goals: Goal TPN rate is 100 mL/hr (provides 120 g of protein and 2304 kcals per day)  RD Assessment: pending     Current Nutrition:  Clear liquids  Plan:  Start TPN at 72mL/hr at 1800 to provide 48 g protein and 921.6 kcal Electrolytes in TPN: Na 61mEq/L, K 68mEq/L, Ca 43mEq/L, Mg 59mEq/L, and Phos 52mmol/L. Cl:Ac 1:1 Add standard MVI and trace elements to TPN Initiate Sensitive q4h SSI and adjust as needed  Reduce MIVF to 35 mL/hr at 1800 Monitor TPN labs on Mon/Thurs CMET, Mg, Phos, TG in AM  Ricardo [name], Ricardo [name] 09/04/2021,10:13 AM

## 2021-09-04 NOTE — Progress Notes (Signed)
Ricardo [name]   DOB:1966/01/26   FY#:[id number]   ENI#:[id number]  Oncology follow up   Subjective: Patient is feeling much better overall, abdominal pain has resolved since NG tube placed and on suction.  He had a normal bowel movement this morning.  No nausea.  He feels IV Dilaudid  Objective:  Vitals:   09/04/21 0415 09/04/21 0501  BP:  105/74  Pulse:  (!) 104  Resp: 18 18  Temp:  98.3 F (36.8 C)  SpO2:  93%    Body mass index is 35.79 kg/m.  Intake/Output Summary (Last 24 hours) at 09/04/2021 0835 Last data filed at 09/04/2021 0700 Gross per 24 hour  Intake 2249.54 ml  Output --  Net 2249.54 ml     Sclerae unicteric  Oropharynx clear  No peripheral adenopathy  Lungs clear -- no rales or rhonchi  Heart regular rate and rhythm  Abdomen soft, mild diffuse tenderness on left side   MSK no focal spinal tenderness, no peripheral edema  Neuro nonfocal    CBG (last 3)  No results for input(s): GLUCAP in the last 72 hours.   Labs:  Urine Studies No results for input(s): UHGB, CRYS in the last 72 hours.  Invalid input(s): UACOL, UAPR, USPG, UPH, UTP, UGL, UKET, UBIL, UNIT, UROB, ULEU, UEPI, UWBC, URBC, Lance Bosch, [place]  Basic Metabolic Panel: Recent Labs  Lab 08/29/21 0414 09/02/21 1741 09/02/21 2035 09/03/21 0421 09/03/21 1009 09/04/21 0457  NA 135 143 133* 136 136  --   K 3.7 <2.0* 4.1 4.5 4.4  --   CL 101 129* 101 100 100  --   CO2 25 12* 23 26 26   --   GLUCOSE 104* 53* 100* 106* 100*  --   BUN 7 11 20  21* 20  --   CREATININE 1.00 0.46* 1.29* 1.21 1.15  --   CALCIUM 8.7* <4.0* 9.0 9.4 9.3  --   MG  --   --  1.9 2.1  --  2.0  PHOS  --   --   --   --   --  3.3   GFR Estimated Creatinine Clearance: 93 mL/min (by C-G formula based on SCr of 1.15 mg/dL). Liver Function Tests: Recent Labs  Lab 09/02/21 1741 09/02/21 2035 09/03/21 0421 09/03/21 1009  AST 13* 29 29 25   ALT 18 42 43 42  ALKPHOS 42 101 114 107  BILITOT 0.4 0.8 0.8 0.9  PROT  <3.0* 7.5 7.8 7.7  ALBUMIN <1.5* 3.5 3.6 3.6   Recent Labs  Lab 09/02/21 1741  LIPASE 24   No results for input(s): AMMONIA in the last 168 hours. Coagulation profile No results for input(s): INR, PROTIME in the last 168 hours.  CBC: Recent Labs  Lab 08/29/21 0414 09/02/21 1741 09/03/21 0421 09/04/21 0457  WBC 16.1* 16.7* 16.5* 12.8*  NEUTROABS  --  13.7* 13.6* 10.5*  HGB 13.1 14.5 13.8 13.0  HCT 39.0 43.7 42.3 39.8  MCV 88.8 89.5 91.0 91.7  PLT 224 338 273 249   Cardiac Enzymes: No results for input(s): CKTOTAL, CKMB, CKMBINDEX, TROPONINI in the last 168 hours. BNP: Invalid input(s): POCBNP CBG: No results for input(s): GLUCAP in the last 168 hours. D-Dimer No results for input(s): DDIMER in the last 72 hours. Hgb A1c No results for input(s): HGBA1C in the last 72 hours. Lipid Profile No results for input(s): CHOL, HDL, LDLCALC, TRIG, CHOLHDL, LDLDIRECT in the last 72 hours. Thyroid function studies No results for input(s): TSH, T4TOTAL,  T3FREE, THYROIDAB in the last 72 hours.  Invalid input(s): FREET3 Anemia work up No results for input(s): VITAMINB12, FOLATE, FERRITIN, TIBC, IRON, RETICCTPCT in the last 72 hours. Microbiology Recent Results (from the past 240 hour(s))  Resp Panel by RT-PCR (Flu A&B, Covid) Nasopharyngeal Swab     Status: None   Collection Time: 08/27/21  3:00 AM   Specimen: Nasopharyngeal Swab; Nasopharyngeal(NP) swabs in vial transport medium  Result Value Ref Range Status   SARS Coronavirus 2 by RT PCR NEGATIVE NEGATIVE Final    Comment: (NOTE) SARS-CoV-2 target nucleic acids are NOT DETECTED.  The SARS-CoV-2 RNA is generally detectable in upper respiratory specimens during the acute phase of infection. The lowest concentration of SARS-CoV-2 viral copies this assay can detect is 138 copies/mL. A negative result does not preclude SARS-Cov-2 infection and should not be used as the sole basis for treatment or other patient management  decisions. A negative result may occur with  improper specimen collection/handling, submission of specimen other than nasopharyngeal swab, presence of viral mutation(s) within the areas targeted by this assay, and inadequate number of viral copies(<138 copies/mL). A negative result must be combined with clinical observations, patient history, and epidemiological information. The expected result is Negative.  Fact Sheet for Patients:  EntrepreneurPulse.com.au  Fact Sheet for Healthcare Providers:  IncredibleEmployment.be  This test is no t yet approved or cleared by the [place] FDA and  has been authorized for detection and/or diagnosis of SARS-CoV-2 by FDA under an Emergency Use Authorization (EUA). This EUA will remain  in effect (meaning this test can be used) for the duration of the COVID-19 declaration under Section 564(b)(1) of the Act, 21 U.S.C.section 360bbb-3(b)(1), unless the authorization is terminated  or revoked sooner.       Influenza A by PCR NEGATIVE NEGATIVE Final   Influenza B by PCR NEGATIVE NEGATIVE Final    Comment: (NOTE) The Xpert Xpress SARS-CoV-2/FLU/RSV plus assay is intended as an aid in the diagnosis of influenza from Nasopharyngeal swab specimens and should not be used as a sole basis for treatment. Nasal washings and aspirates are unacceptable for Xpert Xpress SARS-CoV-2/FLU/RSV testing.  Fact Sheet for Patients: EntrepreneurPulse.com.au  Fact Sheet for Healthcare Providers: IncredibleEmployment.be  This test is not yet approved or cleared by the [place] FDA and has been authorized for detection and/or diagnosis of SARS-CoV-2 by FDA under an Emergency Use Authorization (EUA). This EUA will remain in effect (meaning this test can be used) for the duration of the COVID-19 declaration under Section 564(b)(1) of the Act, 21 U.S.C. section 360bbb-3(b)(1), unless the  authorization is terminated or revoked.  Performed at [facility], [place] [address], [place], [place] [address]   Resp Panel by RT-PCR (Flu A&B, Covid) Nasopharyngeal Swab     Status: None   Collection Time: 09/02/21 10:16 PM   Specimen: Nasopharyngeal Swab; Nasopharyngeal(NP) swabs in vial transport medium  Result Value Ref Range Status   SARS Coronavirus 2 by RT PCR NEGATIVE NEGATIVE Final    Comment: (NOTE) SARS-CoV-2 target nucleic acids are NOT DETECTED.  The SARS-CoV-2 RNA is generally detectable in upper respiratory specimens during the acute phase of infection. The lowest concentration of SARS-CoV-2 viral copies this assay can detect is 138 copies/mL. A negative result does not preclude SARS-Cov-2 infection and should not be used as the sole basis for treatment or other patient management decisions. A negative result may occur with  improper specimen collection/handling, submission of specimen other than nasopharyngeal swab, presence  of viral mutation(s) within the areas targeted by this assay, and inadequate number of viral copies(<138 copies/mL). A negative result must be combined with clinical observations, patient history, and epidemiological information. The expected result is Negative.  Fact Sheet for Patients:  EntrepreneurPulse.com.au  Fact Sheet for Healthcare Providers:  IncredibleEmployment.be  This test is no t yet approved or cleared by the [place] FDA and  has been authorized for detection and/or diagnosis of SARS-CoV-2 by FDA under an Emergency Use Authorization (EUA). This EUA will remain  in effect (meaning this test can be used) for the duration of the COVID-19 declaration under Section 564(b)(1) of the Act, 21 U.S.C.section 360bbb-3(b)(1), unless the authorization is terminated  or revoked sooner.       Influenza A by PCR NEGATIVE NEGATIVE Final   Influenza B by PCR NEGATIVE NEGATIVE  Final    Comment: (NOTE) The Xpert Xpress SARS-CoV-2/FLU/RSV plus assay is intended as an aid in the diagnosis of influenza from Nasopharyngeal swab specimens and should not be used as a sole basis for treatment. Nasal washings and aspirates are unacceptable for Xpert Xpress SARS-CoV-2/FLU/RSV testing.  Fact Sheet for Patients: EntrepreneurPulse.com.au  Fact Sheet for Healthcare Providers: IncredibleEmployment.be  This test is not yet approved or cleared by the [place] FDA and has been authorized for detection and/or diagnosis of SARS-CoV-2 by FDA under an Emergency Use Authorization (EUA). This EUA will remain in effect (meaning this test can be used) for the duration of the COVID-19 declaration under Section 564(b)(1) of the Act, 21 U.S.C. section 360bbb-3(b)(1), unless the authorization is terminated or revoked.  Performed at [facility], [place] [address] [place], [place] [address]       Studies:  DG Abdomen 1 View  Result Date: 09/03/2021 CLINICAL DATA:  Nasogastric tube placement EXAM: ABDOMEN - 1 VIEW COMPARISON:  None. FINDINGS: Nasogastric tube tip overlies the expected mid body of the stomach. Multiple gas-filled dilated loops of small bowel are seen within the mid abdomen and left upper quadrant. No free intraperitoneal gas. IMPRESSION: Nasogastric tube tip within the mid body of the stomach. Electronically Signed   By: Fidela Salisbury M.D.   On: 09/03/2021 00:51   CT ABDOMEN PELVIS W CONTRAST  Result Date: 09/02/2021 CLINICAL DATA:  Bowel obstruction suspected. Abdominal pain. Chemotherapy. Three weeks ago. EXAM: CT ABDOMEN AND PELVIS WITH CONTRAST TECHNIQUE: Multidetector CT imaging of the abdomen and pelvis was performed using the standard protocol following bolus administration of intravenous contrast. CONTRAST:  2mL OMNIPAQUE IOHEXOL 350 MG/ML SOLN COMPARISON:  CT abdomen and pelvis 08/27/2021. FINDINGS:  Lower chest: There is a new trace left pleural effusion. There is atelectasis in the bilateral lung bases. Hepatobiliary: No focal liver abnormality is seen. No gallstones, gallbladder wall thickening, or biliary dilatation. Pancreas: Unremarkable. No pancreatic ductal dilatation or surrounding inflammatory changes. Spleen: Normal in size without focal abnormality. Adrenals/Urinary Tract: Adrenal glands are unremarkable. Kidneys are normal, without renal calculi, focal lesion, or hydronephrosis. Bladder is unremarkable. Stomach/Bowel: There are dilated mid and proximal small bowel loops with air-fluid levels measuring up to 5 cm. Degree of distention has mildly increased. There is also new moderate air-fluid level within the stomach. Transition point is likely in the mid abdomen where there are multiple small bowel loops demonstrating adhesive disease to the anterior abdominal wall. This finding is unchanged. The colon is nondilated. The appendix is not seen. There is no pneumatosis or free air identified. Vascular/Lymphatic: Aortic atherosclerosis. No enlarged abdominal or pelvic lymph  nodes. Nonenlarged cardiophrenic and ileocecal lymph nodes are unchanged. Reproductive: Prostate is unremarkable. Other: There are small fat containing bilateral inguinal hernias. There is a small amount of free fluid in the anterior right abdomen which has mildly increased. There is mesenteric/omental haziness in the left upper quadrant and anterior upper abdomen similar to the prior examination. Musculoskeletal: There are bilateral pars interarticularis defects at L5 without significant listhesis. IMPRESSION: 1. Again seen are findings of small-bowel obstruction. Transition point is likely in the mid abdomen where there are clustered small bowel loops adhered to the anterior abdominal wall. Can not exclude underlying obstructive lesion. Degree of distention has increased when compared to the prior exam. There is now air-fluid level  in the stomach. 2. Increasing free fluid in the right abdomen. 3. New small left pleural effusion. 4. Stable diffuse omental haziness is unchanged, concerning for metastatic disease. Electronically Signed   By: Ronney Asters M.D.   On: 09/02/2021 20:30   DG Abd Portable 1V-Small Bowel Obstruction Protocol-initial, 8 hr delay  Result Date: 09/03/2021 CLINICAL DATA:  Small-bowel obstruction. EXAM: PORTABLE ABDOMEN - 1 VIEW COMPARISON:  09/03/2021 FINDINGS: Nasogastric tube overlies expected mid body of the stomach. Contrast is seen within the gastric fundus. There has been little antegrade passage of contrast into the remainder of the abdomen. Multiple loops of gas-filled dilated small bowel are seen within the epigastrium and left abdomen in keeping with changes of a mid small bowel obstruction. No gross free intraperitoneal gas. No organomegaly. IMPRESSION: No significant antegrade passage of contrast from the gastric lumen. Persistent findings in keeping with a mid small bowel obstruction. Electronically Signed   By: Fidela Salisbury M.D.   On: 09/03/2021 19:48    Assessment: 55 y.o. 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  We will unlikely to be able to anything by mouth, will likely need venting G-tube. OK to remove his NG tube to see how he is doing, and place G-tube by IR in next few days.  Patient had a G-tube before when he was diagnosed, so he knows what to anticipate. -I recommend TPN for nutrition, which we can start today -Patient is not ready for hospice,  given his young age and good performance status otherwise, I will still offer him more chemotherapy when he recovers. -I spoke with Hospitalist Dr. Sherral Hammers this morning. -I will be out of office starting this afternoon, will be back to work next Wednesday. Please call my on-call partner if needed.  -he is scheduled to see me in office next week 1/5.    [name], MD 09/04/2021

## 2021-09-04 NOTE — Clinical Social Work Note (Signed)
°  Transition of Care [facility]) Screening Note   Patient Details  Name: Ricardo [name] Date of Birth: September 06, 1966   Transition of Care [facility]) CM/SW Contact:    Trish Mage, LCSW Phone Number: 09/04/2021, 9:02 AM    Transition of Care Department [facility]) has reviewed patient and no TOC needs have been identified at this time. We will continue to monitor patient advancement through interdisciplinary progression rounds. If new patient transition needs arise, please place a TOC consult.

## 2021-09-05 DIAGNOSIS — F411 Generalized anxiety disorder: Secondary | ICD-10-CM | POA: Diagnosis not present

## 2021-09-05 DIAGNOSIS — R1084 Generalized abdominal pain: Secondary | ICD-10-CM | POA: Diagnosis not present

## 2021-09-05 DIAGNOSIS — K56609 Unspecified intestinal obstruction, unspecified as to partial versus complete obstruction: Secondary | ICD-10-CM | POA: Diagnosis not present

## 2021-09-05 DIAGNOSIS — C189 Malignant neoplasm of colon, unspecified: Secondary | ICD-10-CM | POA: Diagnosis not present

## 2021-09-05 LAB — COMPREHENSIVE METABOLIC PANEL
ALT: 32 U/L (ref 0–44)
AST: 22 U/L (ref 15–41)
Albumin: 3.1 g/dL — ABNORMAL LOW (ref 3.5–5.0)
Alkaline Phosphatase: 98 U/L (ref 38–126)
Anion gap: 7 (ref 5–15)
BUN: 13 mg/dL (ref 6–20)
CO2: 25 mmol/L (ref 22–32)
Calcium: 8.6 mg/dL — ABNORMAL LOW (ref 8.9–10.3)
Chloride: 102 mmol/L (ref 98–111)
Creatinine, Ser: 1 mg/dL (ref 0.61–1.24)
GFR, Estimated: >60 mL/min (ref >60)
Glucose, Bld: 117 mg/dL — ABNORMAL HIGH (ref 70–99)
Potassium: 3.8 mmol/L (ref 3.5–5.1)
Sodium: 134 mmol/L — ABNORMAL LOW (ref 135–145)
Total Bilirubin: 0.7 mg/dL (ref 0.3–1.2)
Total Protein: 7.2 g/dL (ref 6.5–8.1)

## 2021-09-05 LAB — CBC WITH DIFFERENTIAL/PLATELET
Abs Immature Granulocytes: 0.09 10*3/uL — ABNORMAL HIGH (ref 0.00–0.07)
Basophils Absolute: 0 10*3/uL (ref 0.0–0.1)
Basophils Relative: 0 %
Eosinophils Absolute: 0.1 10*3/uL (ref 0.0–0.5)
Eosinophils Relative: 1 %
HCT: 39.1 % (ref 39.0–52.0)
Hemoglobin: 13.1 g/dL (ref 13.0–17.0)
Immature Granulocytes: 1 %
Lymphocytes Relative: 6 %
Lymphs Abs: 0.8 10*3/uL (ref 0.7–4.0)
MCH: 30.1 pg (ref 26.0–34.0)
MCHC: 33.5 g/dL (ref 30.0–36.0)
MCV: 89.9 fL (ref 80.0–100.0)
Monocytes Absolute: 1.1 10*3/uL — ABNORMAL HIGH (ref 0.1–1.0)
Monocytes Relative: 9 %
Neutro Abs: 11 10*3/uL — ABNORMAL HIGH (ref 1.7–7.7)
Neutrophils Relative %: 83 %
Platelets: 229 10*3/uL (ref 150–400)
RBC: 4.35 MIL/uL (ref 4.22–5.81)
RDW: 14.8 % (ref 11.5–15.5)
WBC: 13.1 10*3/uL — ABNORMAL HIGH (ref 4.0–10.5)
nRBC: 0 % (ref 0.0–0.2)

## 2021-09-05 LAB — GLUCOSE, CAPILLARY
Glucose-Capillary: 112 mg/dL — ABNORMAL HIGH (ref 70–99)
Glucose-Capillary: 118 mg/dL — ABNORMAL HIGH (ref 70–99)
Glucose-Capillary: 121 mg/dL — ABNORMAL HIGH (ref 70–99)
Glucose-Capillary: 123 mg/dL — ABNORMAL HIGH (ref 70–99)
Glucose-Capillary: 127 mg/dL — ABNORMAL HIGH (ref 70–99)
Glucose-Capillary: 132 mg/dL — ABNORMAL HIGH (ref 70–99)

## 2021-09-05 LAB — MAGNESIUM: Magnesium: 2.2 mg/dL (ref 1.7–2.4)

## 2021-09-05 LAB — PHOSPHORUS: Phosphorus: 2.9 mg/dL (ref 2.5–4.6)

## 2021-09-05 LAB — TRIGLYCERIDES: Triglycerides: 108 mg/dL (ref <150)

## 2021-09-05 MED ORDER — LACTATED RINGERS IV SOLN: INTRAVENOUS | Status: DC

## 2021-09-05 MED ORDER — POTASSIUM PHOSPHATES 15 MMOLE/5ML IV SOLN
15.0000 mmol | Freq: Once | INTRAVENOUS | Status: AC
  Administered 2021-09-05: 09:00:00 15 mmol via INTRAVENOUS
  Filled 2021-09-05: qty 5

## 2021-09-05 MED ORDER — ENOXAPARIN SODIUM 40 MG/0.4ML IJ SOSY
40.0000 mg | PREFILLED_SYRINGE | Freq: Every day | INTRAMUSCULAR | Status: DC
  Administered 2021-09-06 – 2021-09-12 (×5): 40 mg via SUBCUTANEOUS
  Filled 2021-09-05 (×7): qty 0.4

## 2021-09-05 MED ORDER — INSULIN ASPART 100 UNIT/ML IJ SOLN
0.0000 [IU] | Freq: Four times a day (QID) | INTRAMUSCULAR | Status: DC
  Administered 2021-09-05 – 2021-09-06 (×5): 1 [IU] via SUBCUTANEOUS

## 2021-09-05 MED ORDER — TRAVASOL 10 % IV SOLN
INTRAVENOUS | Status: AC
  Filled 2021-09-05: qty 1254

## 2021-09-05 NOTE — Progress Notes (Signed)
PROGRESS NOTE    Ricardo [name]  IOE:[id number] DOB: Sep 05, 1966 DOA: 09/02/2021 PCP: [name], MD   Brief Narrative:  55 y.o. 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    The patient was recently hospitalized at [facility] from 08/28/2019 to 08/29/2021 for partial small bowel obstruction, subsequently improved with conservative measures, following which had a bowel movement prior to discharge to home on 08/29/2021.  However, over the last 2 to 3 days, he has noted progressive generalized abdominal discomfort, which he describes as sharp to crampy pain, along with worsening abdominal distention over that time.  Pain has been constant over the last day, worsening with palpation of the abdomen as well as any attempts to eat or drink anything.  This is been associated with nausea/vomiting, with the patient noting that he vomits within a few seconds to a minute and attempting to eat or drink anything over the last 1 to 2 days.  Emesis associated with nonbloody findings, no coffee-ground appearance.  Most recent s bowel movement occurred on 09/02/2021, and he has subsequently noted diminished production of flatus.  Reports that this constellation of symptoms are very similar to that which she is experiencing in the days leading up to this most recent previous hospitalization for small bowel obstruction.  Denies any associated subjective fever, chills, rigors, generalized myalgias.  Denies any associated chest pain, palpitations, diaphoresis.  No recent trauma.   In the setting history of metastatic colon cancer, he follows with Dr. Burr Medico of Heme-onc.       ED Course:  Vital signs in the ED were  notable for the following: Temperature max 98.0; heart rate initially 126, which decreased 22 following interval administration of IV fluids, blood pressure 112/94 -138/92; respiratory rate 17-20, oxygen saturation 94 to 98% on room air.   Labs were notable for the following: CMP notable for the following: Bicarbonate 23, creatinine 1.29 compared to most recent prior creatinine data point of 1.0 on 08/29/2021, liver enzymes within normal limits.  High-sensitivity troponin I.  Before.  CBC notable for the following: White blood cell count 16,500 compared to 16,700 on 08/29/2021, platelet count 273.  COVID-19/[place] PCR found to be negative.   Imaging and additional notable ED work-up: CT abdomen/pelvis with contrast suggestive of small bowel obstruction, with transition point likely in the mid abdomen, where there are clustered small bowel loops adhered to the anterior abdominal wall, with air-fluid level noted in the stomach.   EDP discussed the patient's case with the on-call general surgeon, Dr. Redmond Pulling, who recommended admission to the hospital service for further evaluation management of small bowel obstruction, including initiation of conservative measures along with NG tube, with plan for Dr. Redmond Pulling to formally consult and see the patient in the morning.    NG tube placed in the ED, and subsequently attached to low intermittent wall suction.      Subjective: 12/23 afebrile, A/O x4 sitting comfortably in the bed.  Self withdrew NG tube.  Negative nausea/vomiting postprandial    Assessment & Plan:  Covid vaccination;  Principal Problem:   SBO (small bowel obstruction) (HCC) Active Problems:   Nausea and vomiting   AKI (acute kidney injury) ([place])   Abdominal pain  Leukocytosis   GERD (gastroesophageal reflux disease)   GAD (generalized anxiety disorder)  SBO secondary to metastatic colon cancer --12/21 surgery recommendation:recommend SBO protocol and pt may need to stay on liquid  diet long term if SBO resolves. If obstruction does not resolve would recommend placement of venting gastrostomy vs hospice. If patient elects for gastrostomy, IR would potentially be able to place given gastropexy at time of surgery in March of this year -12/22 discuss case with Dr. Burr Medico oncology and she agrees patient will have to remain on liquid diet going forward.  If SBO does not fully resolve may still require G-tube placement by IR - 12/22 begin TPN to meet patient's nutrition requirements -12/22 after patient recovers Dr.Yan Burr Medico plans on continuing chemotherapy -12/22 KUB appears SBO resolved see results below -12/23 continue clear liquids today.  Advance diet to full liquids 12/24 if no setback.   AKI Lab Results  Component Value Date   CREATININE 1.00 09/05/2021   CREATININE 1.15 09/03/2021   CREATININE 1.21 09/03/2021   CREATININE 1.29 (H) 09/02/2021   CREATININE 0.46 (L) 09/02/2021  -12/21 resolved with hydration   Leukocytosis - No evidence of active infection most likely secondary to patient's metastatic colon cancer -If patient spikes fever, bands, left shift WBCs will panculture. -12/22 still elevated but downtrending -12/23 continued leukocytosis but no other signs of infection.  Believe reactive most likely secondary to metastatic cancer.  Hold on any panculture.  GERD - Protonix IV 40 mg daily  Generalized anxiety disorder - Ativan PRN      Severe obesity (BMI 41.9 kg/m ) -  Goals of care - 12/21 consult Palliative Care: Patient with metastatic colon cancer, multiple admissions for SBO, evaluate change of CODE STATUS to DNR, discuss goals of care, appointment of HCPOA     DVT prophylaxis: Lovenox  code Status: partial Family Communication: 12/23 wife at bedside for discussion of plan of care all questions answered Status is: Inpatient    Dispo: The patient is from: Home              Anticipated d/c is to: Home              Anticipated d/c date is:  > 3 days              Patient currently is not medically stable to d/c.      Consultants:  General surgery Dr Redmond Pulling Oncology Dr. [name]  Procedures/Significant Events:  12/22 LDJ:[id number] passage of contrast into decompressed colon with improved small bowel dilatation. No evidence of bowel obstruction or perforation.   I have personally reviewed and interpreted all radiology studies and my findings are as above.  VENTILATOR SETTINGS:    Cultures   Antimicrobials: Anti-infectives (From admission, onward)    None         Devices    LINES / TUBES:      Continuous Infusions:  lactated ringers 35 mL/hr at 09/04/21 1823   lactated ringers     potassium PHOSPHATE IVPB (in mmol) 15 mmol (09/05/21 0917)   TPN ADULT (ION) 40 mL/hr at 09/04/21 1747   TPN ADULT (ION)       Objective: Vitals:   09/05/21 0351 09/05/21 0401 09/05/21 0756 09/05/21 1207  BP: (!) 126/92  125/86 140/86  Pulse: (!) 102  95 88  Resp:   18 18  Temp: 98.2 F (36.8 C)  98 F (36.7 C) 98.1 F (36.7 C)  TempSrc: Oral  SpO2: 96%  94% 96%  Weight:  112.8 kg    Height:        Intake/Output Summary (Last 24 hours) at 09/05/2021 1307 Last data filed at 09/05/2021 0900 Gross per 24 hour  Intake 1509.57 ml  Output 2900 ml  Net -1390.43 ml    Filed Weights   09/04/21 0500 09/04/21 1011 09/05/21 0401  Weight: 116.4 kg 116.4 kg 112.8 kg    Examination:  General: A/O x4, No acute respiratory distress Eyes: negative scleral hemorrhage, negative anisocoria, negative icterus ENT: Negative Runny nose, negative gingival bleeding, Neck:  Negative scars, masses, torticollis, lymphadenopathy, JVD Lungs: Clear to auscultation bilaterally without wheezes or crackles Cardiovascular: Regular rate and rhythm without murmur gallop or rub normal S1 and S2 Abdomen: Negative abdominal pain, decreased distention, positive soft, bowel sounds, no rebound, no ascites, no appreciable  mass Extremities: No significant cyanosis, clubbing, or edema bilateral lower extremities Skin: Negative rashes, lesions, ulcers Psychiatric:  Negative depression, negative anxiety, negative fatigue, negative mania  Central nervous system:  Cranial nerves II through XII intact, tongue/uvula midline, all extremities muscle strength 5/5, sensation intact throughout, negative dysarthria, negative expressive aphasia, negative receptive aphasia.  .     Data Reviewed: Care during the described time interval was provided by me .  I have reviewed this patient's available data, including medical history, events of note, physical examination, and all test results as part of my evaluation.   CBC: Recent Labs  Lab 09/02/21 1741 09/03/21 0421 09/04/21 0457 09/05/21 0534  WBC 16.7* 16.5* 12.8* 13.1*  NEUTROABS 13.7* 13.6* 10.5* 11.0*  HGB 14.5 13.8 13.0 13.1  HCT 43.7 42.3 39.8 39.1  MCV 89.5 91.0 91.7 89.9  PLT 338 273 249 482    Basic Metabolic Panel: Recent Labs  Lab 09/02/21 1741 09/02/21 2035 09/03/21 0421 09/03/21 1009 09/04/21 0457 09/05/21 0534  NA 143 133* 136 136  --  134*  K <2.0* 4.1 4.5 4.4  --  3.8  CL 129* 101 100 100  --  102  CO2 12* 23 26 26   --  25  GLUCOSE 53* 100* 106* 100*  --  117*  BUN 11 20 21* 20  --  13  CREATININE 0.46* 1.29* 1.21 1.15  --  1.00  CALCIUM <4.0* 9.0 9.4 9.3  --  8.6*  MG  --  1.9 2.1  --  2.0 2.2  PHOS  --   --   --   --  3.3 2.9    GFR: Estimated Creatinine Clearance: 105.4 mL/min (by C-G formula based on SCr of 1 mg/dL). Liver Function Tests: Recent Labs  Lab 09/02/21 1741 09/02/21 2035 09/03/21 0421 09/03/21 1009 09/05/21 0534  AST 13* 29 29 25 22   ALT 18 42 43 42 32  ALKPHOS 42 101 114 107 98  BILITOT 0.4 0.8 0.8 0.9 0.7  PROT <3.0* 7.5 7.8 7.7 7.2  ALBUMIN <1.5* 3.5 3.6 3.6 3.1*    Recent Labs  Lab 09/02/21 1741  LIPASE 24    No results for input(s): AMMONIA in the last 168 hours. Coagulation Profile: No  results for input(s): INR, PROTIME in the last 168 hours. Cardiac Enzymes: No results for input(s): CKTOTAL, CKMB, CKMBINDEX, TROPONINI in the last 168 hours. BNP (last 3 results) No results for input(s): PROBNP in the last 8760 hours. HbA1C: No results for input(s): HGBA1C in the last 72 hours. CBG: Recent Labs  Lab 09/04/21 2025 09/05/21 0014 09/05/21 0352 09/05/21 0754 09/05/21 1142  GLUCAP  108* 118* 112* 127* 132*    Lipid Profile: Recent Labs    09/05/21 0534  TRIG 108   Thyroid Function Tests: No results for input(s): TSH, T4TOTAL, FREET4, T3FREE, THYROIDAB in the last 72 hours. Anemia Panel: No results for input(s): VITAMINB12, FOLATE, FERRITIN, TIBC, IRON, RETICCTPCT in the last 72 hours. Urine analysis:    Component Value Date/Time   COLORURINE YELLOW 09/03/2021 0800   APPEARANCEUR CLEAR 09/03/2021 0800   LABSPEC >1.046 (H) 09/03/2021 0800   PHURINE 5.0 09/03/2021 0800   GLUCOSEU NEGATIVE 09/03/2021 0800   HGBUR NEGATIVE 09/03/2021 0800   BILIRUBINUR NEGATIVE 09/03/2021 0800   KETONESUR NEGATIVE 09/03/2021 0800   PROTEINUR NEGATIVE 09/03/2021 0800   NITRITE NEGATIVE 09/03/2021 0800   LEUKOCYTESUR NEGATIVE 09/03/2021 0800   Sepsis Labs: @LABRCNTIP (procalcitonin:4,lacticidven:4)  ) Recent Results (from the past 240 hour(s))  Resp Panel by RT-PCR (Flu A&B, Covid) Nasopharyngeal Swab     Status: None   Collection Time: 08/27/21  3:00 AM   Specimen: Nasopharyngeal Swab; Nasopharyngeal(NP) swabs in vial transport medium  Result Value Ref Range Status   SARS Coronavirus 2 by RT PCR NEGATIVE NEGATIVE Final    Comment: (NOTE) SARS-CoV-2 target nucleic acids are NOT DETECTED.  The SARS-CoV-2 RNA is generally detectable in upper respiratory specimens during the acute phase of infection. The lowest concentration of SARS-CoV-2 viral copies this assay can detect is 138 copies/mL. A negative result does not preclude SARS-Cov-2 infection and should not be used as  the sole basis for treatment or other patient management decisions. A negative result may occur with  improper specimen collection/handling, submission of specimen other than nasopharyngeal swab, presence of viral mutation(s) within the areas targeted by this assay, and inadequate number of viral copies(<138 copies/mL). A negative result must be combined with clinical observations, patient history, and epidemiological information. The expected result is Negative.  Fact Sheet for Patients:  EntrepreneurPulse.com.au  Fact Sheet for Healthcare Providers:  IncredibleEmployment.be  This test is no t yet approved or cleared by the [place] FDA and  has been authorized for detection and/or diagnosis of SARS-CoV-2 by FDA under an Emergency Use Authorization (EUA). This EUA will remain  in effect (meaning this test can be used) for the duration of the COVID-19 declaration under Section 564(b)(1) of the Act, 21 U.S.C.section 360bbb-3(b)(1), unless the authorization is terminated  or revoked sooner.       Influenza A by PCR NEGATIVE NEGATIVE Final   Influenza B by PCR NEGATIVE NEGATIVE Final    Comment: (NOTE) The Xpert Xpress SARS-CoV-2/FLU/RSV plus assay is intended as an aid in the diagnosis of influenza from Nasopharyngeal swab specimens and should not be used as a sole basis for treatment. Nasal washings and aspirates are unacceptable for Xpert Xpress SARS-CoV-2/FLU/RSV testing.  Fact Sheet for Patients: EntrepreneurPulse.com.au  Fact Sheet for Healthcare Providers: IncredibleEmployment.be  This test is not yet approved or cleared by the [place] FDA and has been authorized for detection and/or diagnosis of SARS-CoV-2 by FDA under an Emergency Use Authorization (EUA). This EUA will remain in effect (meaning this test can be used) for the duration of the COVID-19 declaration under Section 564(b)(1) of  the Act, 21 U.S.C. section 360bbb-3(b)(1), unless the authorization is terminated or revoked.  Performed at [facility], [place] [address], [place], [place] [address]   Resp Panel by RT-PCR (Flu A&B, Covid) Nasopharyngeal Swab     Status: None   Collection Time: 09/02/21 10:16 PM   Specimen: Nasopharyngeal Swab;  Nasopharyngeal(NP) swabs in vial transport medium  Result Value Ref Range Status   SARS Coronavirus 2 by RT PCR NEGATIVE NEGATIVE Final    Comment: (NOTE) SARS-CoV-2 target nucleic acids are NOT DETECTED.  The SARS-CoV-2 RNA is generally detectable in upper respiratory specimens during the acute phase of infection. The lowest concentration of SARS-CoV-2 viral copies this assay can detect is 138 copies/mL. A negative result does not preclude SARS-Cov-2 infection and should not be used as the sole basis for treatment or other patient management decisions. A negative result may occur with  improper specimen collection/handling, submission of specimen other than nasopharyngeal swab, presence of viral mutation(s) within the areas targeted by this assay, and inadequate number of viral copies(<138 copies/mL). A negative result must be combined with clinical observations, patient history, and epidemiological information. The expected result is Negative.  Fact Sheet for Patients:  EntrepreneurPulse.com.au  Fact Sheet for Healthcare Providers:  IncredibleEmployment.be  This test is no t yet approved or cleared by the [place] FDA and  has been authorized for detection and/or diagnosis of SARS-CoV-2 by FDA under an Emergency Use Authorization (EUA). This EUA will remain  in effect (meaning this test can be used) for the duration of the COVID-19 declaration under Section 564(b)(1) of the Act, 21 U.S.C.section 360bbb-3(b)(1), unless the authorization is terminated  or revoked sooner.       Influenza A by PCR NEGATIVE  NEGATIVE Final   Influenza B by PCR NEGATIVE NEGATIVE Final    Comment: (NOTE) The Xpert Xpress SARS-CoV-2/FLU/RSV plus assay is intended as an aid in the diagnosis of influenza from Nasopharyngeal swab specimens and should not be used as a sole basis for treatment. Nasal washings and aspirates are unacceptable for Xpert Xpress SARS-CoV-2/FLU/RSV testing.  Fact Sheet for Patients: EntrepreneurPulse.com.au  Fact Sheet for Healthcare Providers: IncredibleEmployment.be  This test is not yet approved or cleared by the [place] FDA and has been authorized for detection and/or diagnosis of SARS-CoV-2 by FDA under an Emergency Use Authorization (EUA). This EUA will remain in effect (meaning this test can be used) for the duration of the COVID-19 declaration under Section 564(b)(1) of the Act, 21 U.S.C. section 360bbb-3(b)(1), unless the authorization is terminated or revoked.  Performed at [facility], [place] [address], [place], [place] [address]          Radiology Studies: DG Abd Portable 1V-Small Bowel Obstruction Protocol-24 hr delay  Result Date: 09/04/2021 CLINICAL DATA:  Small-bowel obstruction. Bowel obstruction protocol/24 delay image. EXAM: PORTABLE ABDOMEN - 1 VIEW COMPARISON:  Radiographs 09/03/2021 and 08/27/2021.  CT 09/02/2021. FINDINGS: 1120 hours. Two views obtained. Tip of the nasogastric tube projects over the distal stomach. The enteric contrast has passed into the colon which appears decompressed. Previously noted small bowel distension is improved. No extraluminal contrast or air collections are identified. Telemetry leads overlie the chest and upper abdomen. IMPRESSION: Antegrade passage of contrast into decompressed colon with improved small bowel dilatation. No evidence of bowel obstruction or perforation. Electronically Signed   By: Richardean Sale M.D.   On: 09/04/2021 13:43   DG Abd Portable 1V-Small  Bowel Obstruction Protocol-initial, 8 hr delay  Result Date: 09/03/2021 CLINICAL DATA:  Small-bowel obstruction. EXAM: PORTABLE ABDOMEN - 1 VIEW COMPARISON:  09/03/2021 FINDINGS: Nasogastric tube overlies expected mid body of the stomach. Contrast is seen within the gastric fundus. There has been little antegrade passage of contrast into the remainder of the abdomen. Multiple loops of gas-filled dilated small bowel are seen  within the epigastrium and left abdomen in keeping with changes of a mid small bowel obstruction. No gross free intraperitoneal gas. No organomegaly. IMPRESSION: No significant antegrade passage of contrast from the gastric lumen. Persistent findings in keeping with a mid small bowel obstruction. Electronically Signed   By: Fidela Salisbury M.D.   On: 09/03/2021 19:48        Scheduled Meds:  Chlorhexidine Gluconate Cloth  6 each Topical Daily   insulin aspart  0-9 Units Subcutaneous Q6H   pantoprazole (PROTONIX) IV  40 mg Intravenous Q24H   Continuous Infusions:  lactated ringers 35 mL/hr at 09/04/21 1823   lactated ringers     potassium PHOSPHATE IVPB (in mmol) 15 mmol (09/05/21 0917)   TPN ADULT (ION) 40 mL/hr at 09/04/21 1747   TPN ADULT (ION)       LOS: 3 days   The patient is critically ill with multiple organ systems failure and requires high complexity decision making for assessment and support, frequent evaluation and titration of therapies, application of advanced monitoring technologies and extensive interpretation of multiple databases. Critical Care Time devoted to patient care services described in this note  Time spent: 40 minutes     Mika Griffitts, Geraldo Docker, MD [facility]   If 7PM-7AM, please contact night-coverage 09/05/2021, 1:07 PM

## 2021-09-05 NOTE — Progress Notes (Signed)
Subjective/Chief Complaint: Bowels moving  feels ok  drinking a lot of liquids    Objective: Vital signs in last 24 hours: Temp:  [97.3 F (36.3 C)-98.8 F (37.1 C)] 98 F (36.7 C) (12/23 0756) Pulse Rate:  [95-102] 95 (12/23 0756) Resp:  [16-18] 18 (12/23 0756) BP: (124-135)/(83-94) 125/86 (12/23 0756) SpO2:  [94 %-96 %] 94 % (12/23 0756) Weight:  [112.8 kg-116.4 kg] 112.8 kg (12/23 0401) Last BM Date: 09/04/21  Intake/Output from previous day: 12/22 0701 - 12/23 0700 In: 1269.6 [P.O.:300; I.V.:969.6] Out: 2900 [Urine:200; Emesis/NG GQQPYP:9509] Intake/Output this shift: No intake/output data recorded.    Lab Results:  Gen:  Alert, NAD, pleasant Pulm: rate and effort normal Abd: Soft, mild distension, nontender  BMET Recent Labs    09/03/21 1009 09/05/21 0534  NA 136 134*  K 4.4 3.8  CL 100 102  CO2 26 25  GLUCOSE 100* 117*  BUN 20 13  CREATININE 1.15 1.00  CALCIUM 9.3 8.6*   PT/INR No results for input(s): LABPROT, INR in the last 72 hours. ABG No results for input(s): PHART, HCO3 in the last 72 hours.  Invalid input(s): PCO2, PO2  Studies/Results: DG Abd Portable 1V-Small Bowel Obstruction Protocol-24 hr delay  Result Date: 09/04/2021 CLINICAL DATA:  Small-bowel obstruction. Bowel obstruction protocol/24 delay image. EXAM: PORTABLE ABDOMEN - 1 VIEW COMPARISON:  Radiographs 09/03/2021 and 08/27/2021.  CT 09/02/2021. FINDINGS: 1120 hours. Two views obtained. Tip of the nasogastric tube projects over the distal stomach. The enteric contrast has passed into the colon which appears decompressed. Previously noted small bowel distension is improved. No extraluminal contrast or air collections are identified. Telemetry leads overlie the chest and upper abdomen. IMPRESSION: Antegrade passage of contrast into decompressed colon with improved small bowel dilatation. No evidence of bowel obstruction or perforation. Electronically Signed   By: Richardean Sale M.D.    On: 09/04/2021 13:43   DG Abd Portable 1V-Small Bowel Obstruction Protocol-initial, 8 hr delay  Result Date: 09/03/2021 CLINICAL DATA:  Small-bowel obstruction. EXAM: PORTABLE ABDOMEN - 1 VIEW COMPARISON:  09/03/2021 FINDINGS: Nasogastric tube overlies expected mid body of the stomach. Contrast is seen within the gastric fundus. There has been little antegrade passage of contrast into the remainder of the abdomen. Multiple loops of gas-filled dilated small bowel are seen within the epigastrium and left abdomen in keeping with changes of a mid small bowel obstruction. No gross free intraperitoneal gas. No organomegaly. IMPRESSION: No significant antegrade passage of contrast from the gastric lumen. Persistent findings in keeping with a mid small bowel obstruction. Electronically Signed   By: Fidela Salisbury M.D.   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 [name] on 11/29/2020 and [facility] and was found to have extensive peritoneal metastasis - determined not to be a candidate for HIPEC - no surgical options at this point - on current chemo, followed by Dr. Burr Medico - palliative team following, consult note is pending - Obstruction seems to be resolving again. Will clamp NG tube and give clear liquids. Plan to d/c if no n/v  Ultimately we would recommend that he stay on a liquid diet long term. If he continues to have recurrent malignant obstructions would recommend  discussing the possibility of a venting G tube with IR.   FEN - IVF per TRH, clamp NGT, CLD VTE - SCDs, okay for chemical prophylaxis from a general surgery standpoint ID - None TOTAL TIME 20 MINUTES   LOS: 3 days    Turner Daniels MD  09/05/2021

## 2021-09-05 NOTE — Progress Notes (Signed)
PHARMACY - TOTAL PARENTERAL NUTRITION CONSULT NOTE   Indication:  Prolonged malnutrition due to  obstruction  Patient Measurements: Height: 5\' 11"  (180.3 cm) Weight: 112.8 kg (248 lb 10.9 oz) IBW/kg (Calculated) : 75.3 TPN AdjBW (KG): 77.3 Body mass index is 34.68 kg/m.  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 Oncology recommending TPN due to unlikely to tolerate po nutrition and likely need for venting G-tube.   Glucose / Insulin: no h/o DM; sensitive SSI q 4 hours; CBGs 108-117 Electrolytes: WNL, UOP unmeasured Renal: SCr 1.15 > 1, UOP: incomplete documentation Hepatic: LFTs, T bili, and TG WNL Intake / Output; MIVF: I/O not completely recorded; LR @ 35 ml/hr GI Imaging: CTAP 09/02/21 findings consistent with small-bowel obstruction, free fluid rt abdomen, small l pleural effusion, stable metastatic disease GI Surgeries / Procedures:  5/20 lap appy 3/22 ex lap  w/ partial bowel resection and ostomy creation 8/22 diagnostic laprascopy w/ peritoneal biopsise Central access:  implanted port 12/12/20 TPN start date: 09/04/21  Nutritional Goals: Goal TPN rate is 95 mL/hr (provides 125 g of protein and 2234 kcals per day)  RD Assessment:  Estimated Needs Total Energy Estimated Needs: 2200-2400 Total Protein Estimated Needs: 115-125g Total Fluid Estimated Needs: 2,2L/day  Current Nutrition:  Clear liquids  Plan:  Now: Kphos 15 mmol IV x 1  At 1800: Advance TPN to goal rate of 95 ml/hr Electrolytes in TPN: Na 73mEq/L, K 68mEq/L, Ca 58mEq/L, Mg 45mEq/L, and Phos 11mmol/L. Cl:Ac 1:1 Add standard MVI and trace elements to TPN Reduce Sensitive to q6h SSI and adjust as needed  Reduce MIVF to KVO mL/hr at 1800 Monitor TPN labs on Mon/Thurs CMET, Mg, and Phos in AM  [place], Nicki Reaper D 09/05/2021,7:07 AM

## 2021-09-05 NOTE — Progress Notes (Signed)
Patient has removed his own NG tube. Patient states he feels a lot better with NG tube out.

## 2021-09-06 DIAGNOSIS — K56609 Unspecified intestinal obstruction, unspecified as to partial versus complete obstruction: Secondary | ICD-10-CM | POA: Diagnosis not present

## 2021-09-06 DIAGNOSIS — J90 Pleural effusion, not elsewhere classified: Secondary | ICD-10-CM

## 2021-09-06 HISTORY — DX: Pleural effusion, not elsewhere classified: J90

## 2021-09-06 LAB — COMPREHENSIVE METABOLIC PANEL
ALT: 28 U/L (ref 0–44)
AST: 18 U/L (ref 15–41)
Albumin: 3.2 g/dL — ABNORMAL LOW (ref 3.5–5.0)
Alkaline Phosphatase: 92 U/L (ref 38–126)
Anion gap: 10 (ref 5–15)
BUN: 14 mg/dL (ref 6–20)
CO2: 24 mmol/L (ref 22–32)
Calcium: 8.9 mg/dL (ref 8.9–10.3)
Chloride: 103 mmol/L (ref 98–111)
Creatinine, Ser: 1.03 mg/dL (ref 0.61–1.24)
GFR, Estimated: >60 mL/min (ref >60)
Glucose, Bld: 118 mg/dL — ABNORMAL HIGH (ref 70–99)
Potassium: 4.1 mmol/L (ref 3.5–5.1)
Sodium: 137 mmol/L (ref 135–145)
Total Bilirubin: 0.6 mg/dL (ref 0.3–1.2)
Total Protein: 7.4 g/dL (ref 6.5–8.1)

## 2021-09-06 LAB — CBC WITH DIFFERENTIAL/PLATELET
Abs Immature Granulocytes: 0.11 10*3/uL — ABNORMAL HIGH (ref 0.00–0.07)
Basophils Absolute: 0 10*3/uL (ref 0.0–0.1)
Basophils Relative: 0 %
Eosinophils Absolute: 0.1 10*3/uL (ref 0.0–0.5)
Eosinophils Relative: 0 %
HCT: 40.1 % (ref 39.0–52.0)
Hemoglobin: 13.4 g/dL (ref 13.0–17.0)
Immature Granulocytes: 1 %
Lymphocytes Relative: 5 %
Lymphs Abs: 0.8 10*3/uL (ref 0.7–4.0)
MCH: 29.8 pg (ref 26.0–34.0)
MCHC: 33.4 g/dL (ref 30.0–36.0)
MCV: 89.3 fL (ref 80.0–100.0)
Monocytes Absolute: 1.3 10*3/uL — ABNORMAL HIGH (ref 0.1–1.0)
Monocytes Relative: 8 %
Neutro Abs: 14.1 10*3/uL — ABNORMAL HIGH (ref 1.7–7.7)
Neutrophils Relative %: 86 %
Platelets: 230 10*3/uL (ref 150–400)
RBC: 4.49 MIL/uL (ref 4.22–5.81)
RDW: 14.7 % (ref 11.5–15.5)
WBC: 16.4 10*3/uL — ABNORMAL HIGH (ref 4.0–10.5)
nRBC: 0 % (ref 0.0–0.2)

## 2021-09-06 LAB — GLUCOSE, CAPILLARY
Glucose-Capillary: 122 mg/dL — ABNORMAL HIGH (ref 70–99)
Glucose-Capillary: 132 mg/dL — ABNORMAL HIGH (ref 70–99)
Glucose-Capillary: 98 mg/dL (ref 70–99)

## 2021-09-06 LAB — PHOSPHORUS: Phosphorus: 3.4 mg/dL (ref 2.5–4.6)

## 2021-09-06 LAB — MAGNESIUM: Magnesium: 2.1 mg/dL (ref 1.7–2.4)

## 2021-09-06 MED ORDER — TRAVASOL 10 % IV SOLN
INTRAVENOUS | Status: AC
  Filled 2021-09-06: qty 1254

## 2021-09-06 NOTE — Progress Notes (Signed)
° °  Subjective/Chief Complaint: Patient feels better with NG tube out.  He has had bowel function.  No nausea or vomiting.   Objective: Vital signs in last 24 hours: Temp:  [98.1 F (36.7 C)-98.7 F (37.1 C)] 98.7 F (37.1 C) (12/24 0456) Pulse Rate:  [88-114] 96 (12/24 0456) Resp:  [18-20] 20 (12/23 2355) BP: (106-140)/(74-91) 106/74 (12/24 0456) SpO2:  [93 %-96 %] 93 % (12/24 0456) Weight:  [115.4 kg] 115.4 kg (12/24 0500) Last BM Date: 09/05/21  Intake/Output from previous day: 12/23 0701 - 12/24 0700 In: 490 [P.O.:480; I.V.:10] Out: -  Intake/Output this shift: No intake/output data recorded.   Gen:  Alert, NAD, pleasant Pulm: rate and effort normal Abd: Soft, mild distension, nontender Lab Results:  Recent Labs    09/05/21 0534 09/06/21 0534  WBC 13.1* 16.4*  HGB 13.1 13.4  HCT 39.1 40.1  PLT 229 230   BMET Recent Labs    09/05/21 0534 09/06/21 0534  NA 134* 137  K 3.8 4.1  CL 102 103  CO2 25 24  GLUCOSE 117* 118*  BUN 13 14  CREATININE 1.00 1.03  CALCIUM 8.6* 8.9   PT/INR No results for input(s): LABPROT, INR in the last 72 hours. ABG No results for input(s): PHART, HCO3 in the last 72 hours.  Invalid input(s): PCO2, PO2  Studies/Results: DG Abd Portable 1V-Small Bowel Obstruction Protocol-24 hr delay  Result Date: 09/04/2021 CLINICAL DATA:  Small-bowel obstruction. Bowel obstruction protocol/24 delay image. EXAM: PORTABLE ABDOMEN - 1 VIEW COMPARISON:  Radiographs 09/03/2021 and 08/27/2021.  CT 09/02/2021. FINDINGS: 1120 hours. Two views obtained. Tip of the nasogastric tube projects over the distal stomach. The enteric contrast has passed into the colon which appears decompressed. Previously noted small bowel distension is improved. No extraluminal contrast or air collections are identified. Telemetry leads overlie the chest and upper abdomen. IMPRESSION: Antegrade passage of contrast into decompressed colon with improved small bowel dilatation.  No evidence of bowel obstruction or perforation. Electronically Signed   By: Richardean Sale M.D.   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 [name] on 11/29/2020 and [facility] and was found to have extensive peritoneal metastasis - determined not to be a candidate for HIPEC - no surgical options at this point - on current chemo, followed by Dr. Burr Medico - palliative team following, consult note is pending - Obstruction seems to be resolving again.  Recommend clear liquid diet.  Would not advance any further.  Nothing else to offer from surgical standpoint.  Recommendations outlined in chart include IR G-tube placement if needed.  We will sign off at this point in time.    Ultimately we would recommend that he stay on a liquid diet long term. If he continues to have recurrent malignant obstructions would recommend discussing the possibility of a venting G tube with IR.   FEN -, CLD VTE - SCDs, okay for chemical prophylaxis from a general surgery standpoint ID - None TOTAL TIME 20 MINUTES  Joyice Faster Jontavious Commons MD  09/06/2021

## 2021-09-06 NOTE — Assessment & Plan Note (Signed)
Fluctuating, follow

## 2021-09-06 NOTE — Assessment & Plan Note (Signed)
improved

## 2021-09-06 NOTE — Assessment & Plan Note (Addendum)
Follows with Dr. Burr Medico outpatient Follow outpatient, planning for continued chemo outpatient He asked me questions about second opinions (was asking about his evaluation for cytoreductive surgery and HIPEC by Dr. Clovis Riley - I reached out to Dr. Clovis Riley who reiterated he was not Ulysses Alper candidate).  Would defer further discussions to Dr. Burr Medico.

## 2021-09-06 NOTE — Assessment & Plan Note (Addendum)
CT 12/20 with SBO  Appreciate surgical recs No surgical options at this point surgery recommending liquid diet long term (see 12/24 sign off note) - recommended liquid diet long term, venting g tube with IR if recurrent malignant obstructions Currently on TPN, plan to d/c home with TPN He's got continued discomfort - he's declining placement of NG tube - pending g tube placement Vomiting 12/24, KUB 12/26 with continued SBO pattern IR c/s for venting g tube -> plan for 12/28

## 2021-09-06 NOTE — Assessment & Plan Note (Signed)
PPI ?

## 2021-09-06 NOTE — Hospital Course (Addendum)
55 yo with hx metastatic colon cancer s/p partial bowel resection in March 2022, GERD, GAD, SBO who presented to [facility] with recurrent SBO.  Now awaiting venting g tube.  Dr. Burr Medico to see 12/28.

## 2021-09-06 NOTE — Assessment & Plan Note (Signed)
Small L effusion, possibly malignant Follow for symptoms

## 2021-09-06 NOTE — TOC Initial Note (Signed)
Transition of Care [facility]) - Initial/Assessment Note   Patient Details  Name: Ricardo [name] MRN: [id number] Date of Birth: 1966-06-29  [facility] CM/SW Contact:    Sherie Don, LCSW Phone Number: 09/06/2021, 1:18 PM  Clinical Narrative: TOC notified patient will need to discharge home on TPN. CSW made referral to [facility] with [facility]. Per Pam, due to the holiday weekend, the compounder will not be able to get the TPN ready until 09/09/21 and HH will likely not be set up before then either. [employer] to assist with setting up [place] updated wife and hospitalist regarding the delay in discharge.  Expected Discharge Plan: [place] Services Barriers to Discharge: Other (must enter comment) (TPN will not be set up until 09/09/21.)  Patient Goals and CMS Choice Choice offered to / list presented to : Spouse  Expected Discharge Plan and Services Expected Discharge Plan: [place] In-house Referral: Clinical Social Work Post Acute Care Choice: [place] arrangements for the past 2 months: Single Family Home             DME Arranged: N/A DME Agency: NA HH Arranged: RN, IV Antibiotics HH Agency: [facility] Date HH Agency Contacted: 09/06/21 Representative spoke with at [place]: [place]  Prior Living Arrangements/Services Living arrangements for the past 2 months: [place] Lives with:: Spouse Patient language and need for interpreter reviewed:: Yes Do you feel safe going back to the place where you live?: Yes      Need for Family Participation in Patient Care: No (Comment) Care giver support system in place?: Yes (comment) Criminal Activity/Legal Involvement Pertinent to Current Situation/Hospitalization: No - Comment as needed  Activities of Daily Living Home Assistive Devices/Equipment: None ADL Screening (condition at time of admission) Patient's cognitive ability adequate to safely complete daily activities?: Yes Is the  patient deaf or have difficulty hearing?: No Does the patient have difficulty seeing, even when wearing glasses/contacts?: No Does the patient have difficulty concentrating, remembering, or making decisions?: No Patient able to express need for assistance with ADLs?: Yes Does the patient have difficulty dressing or bathing?: No Independently performs ADLs?: Yes (appropriate for developmental age) Does the patient have difficulty walking or climbing stairs?: No Weakness of Legs: None Weakness of Arms/Hands: None  Emotional Assessment Orientation: : Oriented to Self, Oriented to Place, Oriented to  Time, Oriented to Situation Alcohol / Substance Use: Not Applicable Psych Involvement: No (comment)  Admission diagnosis:  Small bowel obstruction ([place]) [K56.609] SBO (small bowel obstruction) ([place]) [K56.609] Colon cancer metastasized to mesenteric lymph nodes ([place]) [C18.9, C77.2] Patient Active Problem List   Diagnosis Date Noted   GERD (gastroesophageal reflux disease) 09/03/2021   GAD (generalized anxiety disorder) 09/03/2021   Genetic testing 01/17/2021   Dehydration 01/04/2021   Abdominal pain 01/04/2021   Nausea & vomiting 01/04/2021   High anion gap metabolic acidosis 81/09/7508   Leukocytosis 01/04/2021   AKI (acute kidney injury) ([place]) 01/03/2021   Port-A-Cath in place 01/02/2021   Family history of breast cancer    Family history of prostate cancer    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[place]) 12/04/2020   SBO (small bowel obstruction) ([place]) 11/27/2020   Ileitis 11/27/2020   Intractable abdominal pain 11/27/2020   Nausea and vomiting 11/27/2020   Hyperlipidemia 11/27/2020   Lymphadenopathy, abdominal 11/27/2020   Body mass index (BMI) of 39.0-39.9 in adult 11/10/2018   Arthralgia 11/10/2018   Sleep disorder  11/10/2018   PCP:  [name], MD Pharmacy:   [id number] - [place], [place] [place] [address] Phone: [phone] Fax: [phone]  Readmission Risk Interventions Readmission Risk Prevention Plan 09/06/2021 08/28/2021  Transportation Screening - Complete  HRI or Home Care Consult Complete Complete  Social Work Consult for Recovery Care Planning/Counseling Complete Complete  Palliative Care Screening Not Applicable Not Applicable  Some recent data might be hidden

## 2021-09-06 NOTE — Progress Notes (Signed)
PHARMACY - TOTAL PARENTERAL NUTRITION CONSULT NOTE   Indication:  Prolonged malnutrition due to  obstruction  Patient Measurements: Height: 5\' 11"  (180.3 cm) Weight: 115.4 kg (254 lb 6.6 oz) IBW/kg (Calculated) : 75.3 TPN AdjBW (KG): 77.3 Body mass index is 35.48 kg/m.  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 Oncology recommending TPN due to unlikely to tolerate po nutrition and likely need for venting G-tube.   Glucose / Insulin: no h/o DM; 4 units sensitive SSI q 6hr; CBGs 108-117 Electrolytes: WNL, UOP unmeasured Renal: SCr 1.15 > 1, UOP: incomplete documentation Hepatic: LFTs, T bili, and TG WNL Intake / Output; MIVF: I/O not completely recorded; LR @ 10 ml/hr GI Imaging: CTAP 09/02/21 findings consistent with small-bowel obstruction, free fluid rt abdomen, small l pleural effusion, stable metastatic disease GI Surgeries / Procedures:  5/20 lap appy 3/22 ex lap  w/ partial bowel resection and ostomy creation 8/22 diagnostic laprascopy w/ peritoneal biopsise Central access:  implanted port 12/12/20 TPN start date: 09/04/21  Nutritional Goals: Goal TPN rate is 95 mL/hr (provides 125 g of protein and 2234 kcals per day)  RD Assessment:  Estimated Needs Total Energy Estimated Needs: 2200-2400 Total Protein Estimated Needs: 115-125g Total Fluid Estimated Needs: 2,2L/day  Current Nutrition:  Clear liquids from 12/22  Plan:  At 1800: Continue TPN at goal rate of 95 ml/hr Electrolytes in TPN: Na 53mEq/L, K 74mEq/L, Ca 36mEq/L, Mg 55mEq/L, and Phos 35mmol/L. Cl:Ac 1:1 Add standard MVI and trace elements to TPN Reduce Sensitive to q6h SSI and adjust as needed  M[facility]F at [facility]  Monitor TPN labs on Mon/Thurs BMET am 12/25  Minda Ditto PharmD 09/06/2021,7:10 AM

## 2021-09-06 NOTE — Progress Notes (Signed)
PROGRESS NOTE    Ricardo [name]  ACZ:[id number] DOB: Feb 19, 1966 DOA: 09/02/2021 PCP: Ricardo Pollen, MD  Chief Complaint  Patient presents with   Abdominal Pain   Emesis    Brief Narrative:  55 yo with hx metastatic colon cancer s/p partial bowel resection in March 2022, GERD, GAD, SBO who presented to [facility] with recurrent SBO.    Assessment & Plan:   Principal Problem:   SBO (small bowel obstruction) (H[facility]) Active Problems:   Nausea and vomiting   Abdominal pain   Colon cancer metastasized to multiple sites [facility])   Pleural effusion, left   AKI (acute kidney injury) ([place])   Leukocytosis   GERD (gastroesophageal reflux disease)   GAD (generalized anxiety disorder)   Obesity (BMI 30-39.9)   * SBO (small bowel obstruction) ([place])- (present on admission) CT 12/20 with SBO  Appreciate surgical recs No surgical options at this point Obstruction seems to be resolving -> recommending clear liquid diet, would not advance further -> recommending liquid diet long term Currently on TPN, plan to d/c home with TPN If he continues to have recurrent malignant obstructions, would recommend possibility of venting G tube with IR  Colon cancer metastasized to multiple sites [facility]) Follows with Dr. Burr [name] outpatient Follow outpatient, planning for continued chemo outpatient  Pleural effusion, left Small L effusion, possibly malignant Follow for symptoms  Leukocytosis- (present on admission) Fluctuating, follow  AKI (acute kidney injury) ([place])- (present on admission) improved  GERD (gastroesophageal reflux disease)- (present on admission) PPI     DVT prophylaxis: lvoenox Code Status: partial, no mechanical ventilation Family Communication: none Disposition:   Status is: Inpatient  Remains inpatient appropriate because: continued sx management       Consultants:  oncology surgery  Procedures:  none  Antimicrobials:  Anti-infectives (From admission, onward)     None       Subjective: No new complaints  Objective: Vitals:   09/05/21 2355 09/06/21 0456 09/06/21 0500 09/06/21 1240  BP: (!) 122/91 106/74  112/70  Pulse: (!) 114 96  89  Resp: 20   18  Temp: 98.7 F (37.1 C) 98.7 F (37.1 C)  97.6 F (36.4 C)  TempSrc: Oral Oral  Oral  SpO2: 94% 93%  93%  Weight:   115.4 kg   Height:        Intake/Output Summary (Last 24 hours) at 09/06/2021 2038 Last data filed at 09/06/2021 1304 Gross per 24 hour  Intake 120 ml  Output --  Net 120 ml   Filed Weights   09/04/21 1011 09/05/21 0401 09/06/21 0500  Weight: 116.4 kg 112.8 kg 115.4 kg    Examination:  General exam: Appears calm and comfortable  Respiratory system: unlabored Cardiovascular system: RRR Gastrointestinal system: Abdomen is mildly distended and nontender Central nervous system: Alert and oriented. No focal neurological deficits. Extremities: no LEE Skin: No rashes, lesions or ulcers Psychiatry: Judgement and insight appear normal. Mood & affect appropriate.     Data Reviewed: I have personally reviewed following labs and imaging studies  CBC: Recent Labs  Lab 09/02/21 1741 09/03/21 0421 09/04/21 0457 09/05/21 0534 09/06/21 0534  WBC 16.7* 16.5* 12.8* 13.1* 16.4*  NEUTROABS 13.7* 13.6* 10.5* 11.0* 14.1*  HGB 14.5 13.8 13.0 13.1 13.4  HCT 43.7 42.3 39.8 39.1 40.1  MCV 89.5 91.0 91.7 89.9 89.3  PLT 338 273 249 229 109    Basic Metabolic Panel: Recent Labs  Lab 09/02/21 2035 09/03/21 0421 09/03/21 1009 09/04/21 0457 09/05/21 0534  09/06/21 0534  NA 133* 136 136  --  134* 137  K 4.1 4.5 4.4  --  3.8 4.1  CL 101 100 100  --  102 103  CO2 23 26 26   --  25 24  GLUCOSE 100* 106* 100*  --  117* 118*  BUN 20 21* 20  --  13 14  CREATININE 1.29* 1.21 1.15  --  1.00 1.03  CALCIUM 9.0 9.4 9.3  --  8.6* 8.9  MG 1.9 2.1  --  2.0 2.2 2.1  PHOS  --   --   --  3.3 2.9 3.4    GFR: Estimated Creatinine Clearance: 103.4 mL/min (by C-G formula based on  SCr of 1.03 mg/dL).  Liver Function Tests: Recent Labs  Lab 09/02/21 2035 09/03/21 0421 09/03/21 1009 09/05/21 0534 09/06/21 0534  AST 29 29 25 22 18   ALT 42 43 42 32 28  ALKPHOS 101 114 107 98 92  BILITOT 0.8 0.8 0.9 0.7 0.6  PROT 7.5 7.8 7.7 7.2 7.4  ALBUMIN 3.5 3.6 3.6 3.1* 3.2*    CBG: Recent Labs  Lab 09/05/21 1822 09/05/21 2357 09/06/21 0458 09/06/21 1132 09/06/21 1812  GLUCAP 121* 123* 122* 132* 98     Recent Results (from the past 240 hour(s))  Resp Panel by RT-PCR (Flu Ricardo [name]&B, Covid) Nasopharyngeal Swab     Status: None   Collection Time: 09/02/21 10:16 PM   Specimen: Nasopharyngeal Swab; Nasopharyngeal(NP) swabs in vial transport medium  Result Value Ref Range Status   SARS Coronavirus 2 by RT PCR NEGATIVE NEGATIVE Final    Comment: (NOTE) SARS-CoV-2 target nucleic acids are NOT DETECTED.  The SARS-CoV-2 RNA is generally detectable in upper respiratory specimens during the acute phase of infection. The lowest concentration of SARS-CoV-2 viral copies this assay can detect is 138 copies/mL. Ricardo [name] negative result does not preclude SARS-Cov-2 infection and should not be used as the sole basis for treatment or other patient management decisions. Ricardo [name] negative result may occur with  improper specimen collection/handling, submission of specimen other than nasopharyngeal swab, presence of viral mutation(s) within the areas targeted by this assay, and inadequate number of viral copies(<138 copies/mL). Ricardo [name] negative result must be combined with clinical observations, patient history, and epidemiological information. The expected result is Negative.  Fact Sheet for Patients:  EntrepreneurPulse.com.au  Fact Sheet for Healthcare Providers:  IncredibleEmployment.be  This test is no t yet approved or cleared by the [place] FDA and  has been authorized for detection and/or diagnosis of SARS-CoV-2 by FDA under an Emergency Use  Authorization (EUA). This EUA will remain  in effect (meaning this test can be used) for the duration of the COVID-19 declaration under Section 564(b)(1) of the Act, 21 U.S.C.section 360bbb-3(b)(1), unless the authorization is terminated  or revoked sooner.       Influenza Ananya Mccleese by PCR NEGATIVE NEGATIVE Final   Influenza B by PCR NEGATIVE NEGATIVE Final    Comment: (NOTE) The Xpert Xpress SARS-CoV-2/FLU/RSV plus assay is intended as an aid in the diagnosis of influenza from Nasopharyngeal swab specimens and should not be used as Yuridia Couts sole basis for treatment. Nasal washings and aspirates are unacceptable for Xpert Xpress SARS-CoV-2/FLU/RSV testing.  Fact Sheet for Patients: EntrepreneurPulse.com.au  Fact Sheet for Healthcare Providers: IncredibleEmployment.be  This test is not yet approved or cleared by the [place] FDA and has been authorized for detection and/or diagnosis of SARS-CoV-2 by FDA under an Emergency Use Authorization (EUA). This EUA will remain in  effect (meaning this test can be used) for the duration of the COVID-19 declaration under Section 564(b)(1) of the Act, 21 U.S.C. section 360bbb-3(b)(1), unless the authorization is terminated or revoked.  Performed at [facility], [place] [address] [place], [place] [address]          Radiology Studies: No results found.      Scheduled Meds:  Chlorhexidine Gluconate Cloth  6 each Topical Daily   enoxaparin (LOVENOX) injection  40 mg Subcutaneous Daily   insulin aspart  0-9 Units Subcutaneous Q6H   pantoprazole (PROTONIX) IV  40 mg Intravenous Q24H   Continuous Infusions:  lactated ringers 10 mL/hr at 09/05/21 1849   TPN ADULT (ION) 95 mL/hr at 09/06/21 1846     LOS: 4 days    Time spent: over 30 min    Fayrene Helper, MD [facility]   To contact the attending provider between 7A-7P or the covering provider during after hours 7P-7A,  please log into the web site [email or web address] and access using universal [place] password for that web site. If you do not have the password, please call the hospital operator.  09/06/2021, 8:38 PM

## 2021-09-07 ENCOUNTER — Inpatient Hospital Stay (HOSPITAL_COMMUNITY): Payer: BC Managed Care – PPO

## 2021-09-07 DIAGNOSIS — K56609 Unspecified intestinal obstruction, unspecified as to partial versus complete obstruction: Secondary | ICD-10-CM | POA: Diagnosis not present

## 2021-09-07 LAB — CBC WITH DIFFERENTIAL/PLATELET
Abs Immature Granulocytes: 0.09 10*3/uL — ABNORMAL HIGH (ref 0.00–0.07)
Basophils Absolute: 0.1 10*3/uL (ref 0.0–0.1)
Basophils Relative: 0 %
Eosinophils Absolute: 0.1 10*3/uL (ref 0.0–0.5)
Eosinophils Relative: 1 %
HCT: 38.4 % — ABNORMAL LOW (ref 39.0–52.0)
Hemoglobin: 12.9 g/dL — ABNORMAL LOW (ref 13.0–17.0)
Immature Granulocytes: 1 %
Lymphocytes Relative: 6 %
Lymphs Abs: 0.9 10*3/uL (ref 0.7–4.0)
MCH: 29.9 pg (ref 26.0–34.0)
MCHC: 33.6 g/dL (ref 30.0–36.0)
MCV: 89.1 fL (ref 80.0–100.0)
Monocytes Absolute: 1.4 10*3/uL — ABNORMAL HIGH (ref 0.1–1.0)
Monocytes Relative: 9 %
Neutro Abs: 13.1 10*3/uL — ABNORMAL HIGH (ref 1.7–7.7)
Neutrophils Relative %: 83 %
Platelets: 246 10*3/uL (ref 150–400)
RBC: 4.31 MIL/uL (ref 4.22–5.81)
RDW: 14.8 % (ref 11.5–15.5)
WBC: 15.6 10*3/uL — ABNORMAL HIGH (ref 4.0–10.5)
nRBC: 0 % (ref 0.0–0.2)

## 2021-09-07 LAB — GLUCOSE, CAPILLARY
Glucose-Capillary: 102 mg/dL — ABNORMAL HIGH (ref 70–99)
Glucose-Capillary: 111 mg/dL — ABNORMAL HIGH (ref 70–99)
Glucose-Capillary: 113 mg/dL — ABNORMAL HIGH (ref 70–99)
Glucose-Capillary: 116 mg/dL — ABNORMAL HIGH (ref 70–99)
Glucose-Capillary: 117 mg/dL — ABNORMAL HIGH (ref 70–99)
Glucose-Capillary: 121 mg/dL — ABNORMAL HIGH (ref 70–99)

## 2021-09-07 LAB — COMPREHENSIVE METABOLIC PANEL
ALT: 33 U/L (ref 0–44)
AST: 26 U/L (ref 15–41)
Albumin: 3 g/dL — ABNORMAL LOW (ref 3.5–5.0)
Alkaline Phosphatase: 99 U/L (ref 38–126)
Anion gap: 7 (ref 5–15)
BUN: 18 mg/dL (ref 6–20)
CO2: 23 mmol/L (ref 22–32)
Calcium: 8.3 mg/dL — ABNORMAL LOW (ref 8.9–10.3)
Chloride: 106 mmol/L (ref 98–111)
Creatinine, Ser: 1.02 mg/dL (ref 0.61–1.24)
GFR, Estimated: >60 mL/min (ref >60)
Glucose, Bld: 114 mg/dL — ABNORMAL HIGH (ref 70–99)
Potassium: 4.6 mmol/L (ref 3.5–5.1)
Sodium: 136 mmol/L (ref 135–145)
Total Bilirubin: 0.6 mg/dL (ref 0.3–1.2)
Total Protein: 7.1 g/dL (ref 6.5–8.1)

## 2021-09-07 LAB — MAGNESIUM: Magnesium: 2.1 mg/dL (ref 1.7–2.4)

## 2021-09-07 LAB — PHOSPHORUS: Phosphorus: 3.9 mg/dL (ref 2.5–4.6)

## 2021-09-07 MED ORDER — TRAVASOL 10 % IV SOLN
INTRAVENOUS | Status: AC
  Filled 2021-09-07: qty 1254

## 2021-09-07 MED ORDER — INSULIN ASPART 100 UNIT/ML IJ SOLN
0.0000 [IU] | INTRAMUSCULAR | Status: AC
Start: 2021-09-07 — End: 2021-09-08
  Administered 2021-09-07: 21:00:00 1 [IU] via SUBCUTANEOUS

## 2021-09-07 MED ORDER — HYDROMORPHONE HCL 1 MG/ML IJ SOLN
1.0000 mg | INTRAMUSCULAR | Status: DC | PRN
  Administered 2021-09-07 – 2021-09-13 (×51): 1 mg via INTRAVENOUS
  Filled 2021-09-07 (×52): qty 1

## 2021-09-07 NOTE — Progress Notes (Signed)
PROGRESS NOTE    Ricardo [name]  FXT:[id number] DOB: 02/19/1966 DOA: 09/02/2021 PCP: [name], MD  Chief Complaint  Patient presents with   Abdominal Pain   Emesis    Brief Narrative:  55 yo with hx metastatic colon cancer s/p partial bowel resection in March 2022, GERD, GAD, SBO who presented to [facility] with recurrent SBO.    Assessment & Plan:   Principal Problem:   SBO (small bowel obstruction) (HCC) Active Problems:   Nausea and vomiting   Abdominal pain   Colon cancer metastasized to multiple sites [facility])   Pleural effusion, left   AKI (acute kidney injury) ([place])   Leukocytosis   GERD (gastroesophageal reflux disease)   GAD (generalized anxiety disorder)   Obesity (BMI 30-39.9)   * SBO (small bowel obstruction) ([place])- (present on admission) CT 12/20 with SBO  Appreciate surgical recs No surgical options at this point surgery recommending liquid diet long term (see 12/24 sign off note) Currently on TPN, plan to d/c home with TPN Vomiting yesterday, no flatus/BM since then -> KUB, NPO, plan for venting G tube with IR per prior surgery recs If he continues to have recurrent malignant obstructions, would recommend possibility of venting G tube with IR  Colon cancer metastasized to multiple sites [facility]) Follows with Dr. Burr Medico outpatient Follow outpatient, planning for continued chemo outpatient  Pleural effusion, left Small L effusion, possibly malignant Follow for symptoms  Leukocytosis- (present on admission) Fluctuating, follow  AKI (acute kidney injury) ([place])- (present on admission) improved  GERD (gastroesophageal reflux disease)- (present on admission) PPI   DVT prophylaxis: lvoenox Code Status: partial, no mechanical ventilation Family Communication: none, son at bedside Disposition:   Status is: Inpatient  Remains inpatient appropriate because: continued sx management       Consultants:  oncology surgery  Procedures:   none  Antimicrobials:  Anti-infectives (From admission, onward)    None       Subjective: C/o pain after emesis yesterday  Objective: Vitals:   09/06/21 1240 09/06/21 2112 09/07/21 0601 09/07/21 1215  BP: 112/70 135/90 125/72 112/78  Pulse: 89 95 87 88  Resp: 18 18 16 18   Temp: 97.6 F (36.4 C) 98.2 F (36.8 C) 98.3 F (36.8 C) 97.7 F (36.5 C)  TempSrc: Oral Oral Oral Oral  SpO2: 93% 96% 95% 93%  Weight:      Height:        Intake/Output Summary (Last 24 hours) at 09/07/2021 1405 Last data filed at 09/07/2021 0200 Gross per 24 hour  Intake 995.35 ml  Output --  Net 995.35 ml   Filed Weights   09/04/21 1011 09/05/21 0401 09/06/21 0500  Weight: 116.4 kg 112.8 kg 115.4 kg    Examination:  General: No acute distress. Cardiovascular: RRR Lungs: unlabored Abdomen: quiet bowel sounds, mildly distended, mild diffuse abdominal pain  Neurological: Alert and oriented 3. Moves all extremities 4 . Cranial nerves II through XII grossly intact. Skin: Warm and dry. No rashes or lesions. Extremities: No clubbing or cyanosis. No edema.    Data Reviewed: I have personally reviewed following labs and imaging studies  CBC: Recent Labs  Lab 09/03/21 0421 09/04/21 0457 09/05/21 0534 09/06/21 0534 09/07/21 0609  WBC 16.5* 12.8* 13.1* 16.4* 15.6*  NEUTROABS 13.6* 10.5* 11.0* 14.1* 13.1*  HGB 13.8 13.0 13.1 13.4 12.9*  HCT 42.3 39.8 39.1 40.1 38.4*  MCV 91.0 91.7 89.9 89.3 89.1  PLT 273 249 229 230 299    Basic Metabolic Panel:  Recent Labs  Lab 09/03/21 0421 09/03/21 1009 09/04/21 0457 09/05/21 0534 09/06/21 0534 09/07/21 0609  NA 136 136  --  134* 137 136  K 4.5 4.4  --  3.8 4.1 4.6  CL 100 100  --  102 103 106  CO2 26 26  --  25 24 23   GLUCOSE 106* 100*  --  117* 118* 114*  BUN 21* 20  --  13 14 18   CREATININE 1.21 1.15  --  1.00 1.03 1.02  CALCIUM 9.4 9.3  --  8.6* 8.9 8.3*  MG 2.1  --  2.0 2.2 2.1 2.1  PHOS  --   --  3.3 2.9 3.4 3.9     GFR: Estimated Creatinine Clearance: 104.4 mL/min (by C-G formula based on SCr of 1.02 mg/dL).  Liver Function Tests: Recent Labs  Lab 09/03/21 0421 09/03/21 1009 09/05/21 0534 09/06/21 0534 09/07/21 0609  AST 29 25 22 18 26   ALT 43 42 32 28 33  ALKPHOS 114 107 98 92 99  BILITOT 0.8 0.9 0.7 0.6 0.6  PROT 7.8 7.7 7.2 7.4 7.1  ALBUMIN 3.6 3.6 3.1* 3.2* 3.0*    CBG: Recent Labs  Lab 09/06/21 1132 09/06/21 1812 09/07/21 0052 09/07/21 0708 09/07/21 1213  GLUCAP 132* 98 116* 117* 113*     Recent Results (from the past 240 hour(s))  Resp Panel by RT-PCR (Flu Shemar Plemmons&B, Covid) Nasopharyngeal Swab     Status: None   Collection Time: 09/02/21 10:16 PM   Specimen: Nasopharyngeal Swab; Nasopharyngeal(NP) swabs in vial transport medium  Result Value Ref Range Status   SARS Coronavirus 2 by RT PCR NEGATIVE NEGATIVE Final    Comment: (NOTE) SARS-CoV-2 target nucleic acids are NOT DETECTED.  The SARS-CoV-2 RNA is generally detectable in upper respiratory specimens during the acute phase of infection. The lowest concentration of SARS-CoV-2 viral copies this assay can detect is 138 copies/mL. Keyani Rigdon negative result does not preclude SARS-Cov-2 infection and should not be used as the sole basis for treatment or other patient management decisions. Leeba Barbe negative result may occur with  improper specimen collection/handling, submission of specimen other than nasopharyngeal swab, presence of viral mutation(s) within the areas targeted by this assay, and inadequate number of viral copies(<138 copies/mL). Niccolo Burggraf negative result must be combined with clinical observations, patient history, and epidemiological information. The expected result is Negative.  Fact Sheet for Patients:  EntrepreneurPulse.com.au  Fact Sheet for Healthcare Providers:  IncredibleEmployment.be  This test is no t yet approved or cleared by the [place] FDA and  has been authorized for  detection and/or diagnosis of SARS-CoV-2 by FDA under an Emergency Use Authorization (EUA). This EUA will remain  in effect (meaning this test can be used) for the duration of the COVID-19 declaration under Section 564(b)(1) of the Act, 21 U.S.C.section 360bbb-3(b)(1), unless the authorization is terminated  or revoked sooner.       Influenza Veronia Laprise by PCR NEGATIVE NEGATIVE Final   Influenza B by PCR NEGATIVE NEGATIVE Final    Comment: (NOTE) The Xpert Xpress SARS-CoV-2/FLU/RSV plus assay is intended as an aid in the diagnosis of influenza from Nasopharyngeal swab specimens and should not be used as Otisha Spickler sole basis for treatment. Nasal washings and aspirates are unacceptable for Xpert Xpress SARS-CoV-2/FLU/RSV testing.  Fact Sheet for Patients: EntrepreneurPulse.com.au  Fact Sheet for Healthcare Providers: IncredibleEmployment.be  This test is not yet approved or cleared by the [place] FDA and has been authorized for detection and/or diagnosis of SARS-CoV-2 by  FDA under an Emergency Use Authorization (EUA). This EUA will remain in effect (meaning this test can be used) for the duration of the COVID-19 declaration under Section 564(b)(1) of the Act, 21 U.S.C. section 360bbb-3(b)(1), unless the authorization is terminated or revoked.  Performed at [facility], [place] [address], [place], [place] [address]          Radiology Studies: DG Abd 1 View  Result Date: 09/07/2021 CLINICAL DATA:  Nausea and vomiting; EXAM: ABDOMEN - 1 VIEW COMPARISON:  September 04, 2021 ;September 03, 2021; September 02, 2021 FINDINGS: There are Aqua Denslow few mildly dilated loops of bowel in the upper abdomen, improved in comparison to prior from September 03, 2021. Interval removal of enteric tube. Partial visualization of CVC tip terminating over the region of the superior cavoatrial junction. No bowel gas visualized in the rectum. IMPRESSION: Persistent mild  dilation of several loops of small bowel in the upper abdomen, overall improved since September 03, 2021. This could reflect persistent small-bowel obstruction. Electronically Signed   By: Valentino Saxon M.D.   On: 09/07/2021 14:00        Scheduled Meds:  Chlorhexidine Gluconate Cloth  6 each Topical Daily   enoxaparin (LOVENOX) injection  40 mg Subcutaneous Daily   insulin aspart  0-9 Units Subcutaneous 4 times per day on Sun Mon   pantoprazole (PROTONIX) IV  40 mg Intravenous Q24H   Continuous Infusions:  lactated ringers 10 mL/hr at 09/05/21 1849   TPN ADULT (ION) 95 mL/hr at 60/45/40 9811   TPN CYCLIC-ADULT (ION)       LOS: 5 days    Time spent: over 30 min    Fayrene Helper, MD [facility]   To contact the attending provider between 7A-7P or the covering provider during after hours 7P-7A, please log into the web site [email or web address] and access using universal [place] password for that web site. If you do not have the password, please call the hospital operator.  09/07/2021, 2:05 PM

## 2021-09-07 NOTE — Progress Notes (Signed)
PHARMACY - TOTAL PARENTERAL NUTRITION CONSULT NOTE   Indication:  Prolonged malnutrition due to  obstruction  Patient Measurements: Height: 5\' 11"  (180.3 cm) Weight: 115.4 kg (254 lb 6.6 oz) IBW/kg (Calculated) : 75.3 TPN AdjBW (KG): 77.3 Body mass index is 35.48 kg/m.  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 Oncology recommending TPN due to unlikely to tolerate po nutrition and likely need for venting G-tube.   Glucose / Insulin: no h/o DM; 2 units sensitive SSI q 6hr; CBGs < 150 Electrolytes: wnl Renal: SCr 1.02 Hepatic: LFTs, T bili, and TG WNL Intake / Output; MIVF: I/O not completely recorded; LR @ 10 ml/hr GI Imaging: CTAP 09/02/21 findings consistent with small-bowel obstruction, free fluid rt abdomen, small l pleural effusion, stable metastatic disease GI Surgeries / Procedures:  5/20 lap appy 3/22 ex lap  w/ partial bowel resection and ostomy creation 8/22 diagnostic laprascopy w/ peritoneal biopsise Central access:  implanted port 12/12/20 TPN start date: 09/04/21  Nutritional Goals: Goal TPN rate is 95 mL/hr (provides 125 g of protein and 2234 kcals per day)  RD Assessment:  Estimated Needs Total Energy Estimated Needs: 2200-2400 Total Protein Estimated Needs: 115-125g Total Fluid Estimated Needs: 2,2L/day  Current Nutrition:  Clear liquids from 12/22 - CCS recommending not advance diet further, anticipate home on TPN NG tube out, consider G-tube  Plan:  At 1800: Continue TPN at cyclic rate over 18 hr, start 6pm > end 12n Electrolytes in TPN: Na 39mEq/L, K 10mEq/L, Ca 61mEq/L, Mg 37mEq/L, and Phos 50mmol/L. Cl:Ac 1:1 Add standard MVI and trace elements to TPN Sensitive SSI at 8p, 2a, 1p, 4p x 4 occurrences MIVF at [facility] - infuse only when TPN off Monitor TPN labs on Mon/Thurs  Minda Ditto PharmD 09/07/2021,7:13 AM

## 2021-09-08 ENCOUNTER — Inpatient Hospital Stay (HOSPITAL_COMMUNITY): Payer: BC Managed Care – PPO

## 2021-09-08 DIAGNOSIS — K56609 Unspecified intestinal obstruction, unspecified as to partial versus complete obstruction: Secondary | ICD-10-CM | POA: Diagnosis not present

## 2021-09-08 LAB — CBC WITH DIFFERENTIAL/PLATELET
Abs Immature Granulocytes: 0.09 10*3/uL — ABNORMAL HIGH (ref 0.00–0.07)
Basophils Absolute: 0.1 10*3/uL (ref 0.0–0.1)
Basophils Relative: 0 %
Eosinophils Absolute: 0.2 10*3/uL (ref 0.0–0.5)
Eosinophils Relative: 1 %
HCT: 37.9 % — ABNORMAL LOW (ref 39.0–52.0)
Hemoglobin: 12.6 g/dL — ABNORMAL LOW (ref 13.0–17.0)
Immature Granulocytes: 1 %
Lymphocytes Relative: 6 %
Lymphs Abs: 1 10*3/uL (ref 0.7–4.0)
MCH: 29.9 pg (ref 26.0–34.0)
MCHC: 33.2 g/dL (ref 30.0–36.0)
MCV: 90 fL (ref 80.0–100.0)
Monocytes Absolute: 1.5 10*3/uL — ABNORMAL HIGH (ref 0.1–1.0)
Monocytes Relative: 10 %
Neutro Abs: 12.1 10*3/uL — ABNORMAL HIGH (ref 1.7–7.7)
Neutrophils Relative %: 82 %
Platelets: 243 10*3/uL (ref 150–400)
RBC: 4.21 MIL/uL — ABNORMAL LOW (ref 4.22–5.81)
RDW: 15.1 % (ref 11.5–15.5)
WBC: 14.9 10*3/uL — ABNORMAL HIGH (ref 4.0–10.5)
nRBC: 0 % (ref 0.0–0.2)

## 2021-09-08 LAB — COMPREHENSIVE METABOLIC PANEL
ALT: 36 U/L (ref 0–44)
AST: 27 U/L (ref 15–41)
Albumin: 3 g/dL — ABNORMAL LOW (ref 3.5–5.0)
Alkaline Phosphatase: 105 U/L (ref 38–126)
Anion gap: 8 (ref 5–15)
BUN: 18 mg/dL (ref 6–20)
CO2: 21 mmol/L — ABNORMAL LOW (ref 22–32)
Calcium: 8.2 mg/dL — ABNORMAL LOW (ref 8.9–10.3)
Chloride: 104 mmol/L (ref 98–111)
Creatinine, Ser: 0.9 mg/dL (ref 0.61–1.24)
GFR, Estimated: >60 mL/min (ref >60)
Glucose, Bld: 99 mg/dL (ref 70–99)
Potassium: 4.7 mmol/L (ref 3.5–5.1)
Sodium: 133 mmol/L — ABNORMAL LOW (ref 135–145)
Total Bilirubin: 0.5 mg/dL (ref 0.3–1.2)
Total Protein: 6.9 g/dL (ref 6.5–8.1)

## 2021-09-08 LAB — GLUCOSE, CAPILLARY
Glucose-Capillary: 95 mg/dL (ref 70–99)
Glucose-Capillary: 112 mg/dL — ABNORMAL HIGH (ref 70–99)
Glucose-Capillary: 83 mg/dL (ref 70–99)
Glucose-Capillary: 90 mg/dL (ref 70–99)
Glucose-Capillary: 135 mg/dL — ABNORMAL HIGH (ref 70–99)

## 2021-09-08 LAB — TRIGLYCERIDES: Triglycerides: 70 mg/dL (ref <150)

## 2021-09-08 LAB — MAGNESIUM: Magnesium: 2.1 mg/dL (ref 1.7–2.4)

## 2021-09-08 LAB — PHOSPHORUS: Phosphorus: 3.5 mg/dL (ref 2.5–4.6)

## 2021-09-08 MED ORDER — INSULIN ASPART 100 UNIT/ML IJ SOLN
0.0000 [IU] | INTRAMUSCULAR | Status: DC
Start: 2021-09-08 — End: 2021-09-08

## 2021-09-08 MED ORDER — TRAVASOL 10 % IV SOLN
INTRAVENOUS | Status: AC
  Filled 2021-09-08: qty 1254

## 2021-09-08 MED ORDER — INSULIN ASPART 100 UNIT/ML IJ SOLN
0.0000 [IU] | INTRAMUSCULAR | Status: DC
Start: 2021-09-08 — End: 2021-09-13
  Administered 2021-09-08: 21:00:00 1 [IU] via SUBCUTANEOUS

## 2021-09-08 NOTE — Progress Notes (Signed)
PHARMACY - TOTAL PARENTERAL NUTRITION CONSULT NOTE   Indication:  Prolonged malnutrition due to  obstruction  Patient Measurements: Height: 5\' 11"  (180.3 cm) Weight: 115.4 kg (254 lb 6.6 oz) IBW/kg (Calculated) : 75.3 TPN AdjBW (KG): 77.3 Body mass index is 35.48 kg/m.  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 Oncology recommending TPN due to unlikely to tolerate po nutrition and likely need for venting G-tube.   Glucose / Insulin: no h/o DM, goal CBGs 100-15 - CBG range 95-113 - 1 units sensitive SSI q 6hr Electrolytes: Na low 133, K at upper end of goal 4.7, CO2 low 21 Renal: SCr WNL Hepatic: LFTs, T bili, and TG WNL Intake / Output: incomplete - UOP 470ml/h24, net I/O +1686ml MIVF: LR @ 10 ml/hr GI Imaging:  - CTAP 09/02/21 findings consistent with small-bowel obstruction, free fluid rt abdomen, small l pleural effusion, stable metastatic disease GI Surgeries / Procedures:  5/20 lap appy 3/22 ex lap  w/ partial bowel resection and ostomy creation 8/22 diagnostic laprascopy w/ peritoneal biopsise  Central access:  implanted port 12/12/20 TPN start date: 09/04/21  Nutritional Goals: Goal TPN rate is 95 mL/hr (provides 125 g of protein and 2234 kcals per day)  RD Assessment:  Estimated Needs Total Energy Estimated Needs: 2200-2400 Total Protein Estimated Needs: 115-125g Total Fluid Estimated Needs: 2,2L/day  Current Nutrition:  Clear liquids from 12/22 - CCS recommending not advance diet further, anticipate home on TPN; back to NPO 12/25 NG tube out, consider venting G-tube  Plan:  At 1800: Adjust TPN cyclic rate to 14 hr, start 6pm > end 10am Electrolytes in TPN:  Na 100mEq/L (increase), K 33mEq/L (decrease),  Ca 2mEq/L,  Mg 70mEq/L,  Phos 9mmol/L.  Cl:Ac 1:1 Add standard MVI and trace elements to TPN Sensitive SSI at 8p, 2a, 11a, 4p x 4  occurrences MIVF at [facility] - infuse only when TPN off Monitor TPN labs on Mon/Thurs  Peggyann Juba, PharmD, BCPS Pharmacy: [phone] 09/08/2021,7:07 AM

## 2021-09-08 NOTE — Progress Notes (Signed)
PROGRESS NOTE    Ricardo [name]  TDV:[id number] DOB: 1965/10/12 DOA: 09/02/2021 PCP: [name], MD  Chief Complaint  Patient presents with   Abdominal Pain   Emesis    Brief Narrative:  55 yo with hx metastatic colon cancer s/p partial bowel resection in March 2022, GERD, GAD, SBO who presented to [facility] with recurrent SBO.    Assessment & Plan:   Principal Problem:   SBO (small bowel obstruction) (HCC) Active Problems:   Nausea and vomiting   Abdominal pain   Colon cancer metastasized to multiple sites [facility])   Pleural effusion, left   AKI (acute kidney injury) ([place])   Leukocytosis   GERD (gastroesophageal reflux disease)   GAD (generalized anxiety disorder)   Obesity (BMI 30-39.9)   * SBO (small bowel obstruction) ([place])- (present on admission) CT 12/20 with SBO  Appreciate surgical recs No surgical options at this point surgery recommending liquid diet long term (see 12/24 sign off note) Currently on TPN, plan to d/c home with TPN Vomiting 12/24, KUB today with continued SBO pattern IR c/s for venting g tube If he continues to have recurrent malignant obstructions, would recommend possibility of venting G tube with IR  Colon cancer metastasized to multiple sites [facility]) Follows with Dr. Burr Medico outpatient Follow outpatient, planning for continued chemo outpatient He's interested in discussing options including Jurnie Garritano second opinion, will reach out to Dr. Burr Medico when she's back on service.   Pleural effusion, left Small L effusion, possibly malignant Follow for symptoms  Leukocytosis- (present on admission) Fluctuating, follow  AKI (acute kidney injury) ([place])- (present on admission) improved  GERD (gastroesophageal reflux disease)- (present on admission) PPI   DVT prophylaxis: lvoenox Code Status: partial, no mechanical ventilation Family Communication: none, son at bedside Disposition:   Status is: Inpatient  Remains inpatient appropriate because:  continued sx management       Consultants:  oncology surgery  Procedures:  none  Antimicrobials:  Anti-infectives (From admission, onward)    None       Subjective: Wondering about 2nd opinion  Objective: Vitals:   09/08/21 0516 09/08/21 0519 09/08/21 0800 09/08/21 1430  BP: 116/78 131/84 122/80 (!) 131/97  Pulse: 89 99 98 99  Resp: 16 18 18 20   Temp: 98.7 F (37.1 C) 98.3 F (36.8 C) 98.3 F (36.8 C) (!) 97.3 F (36.3 C)  TempSrc: Oral Oral Tympanic Oral  SpO2: 95% 99% 98% 96%  Weight:      Height:        Intake/Output Summary (Last 24 hours) at 09/08/2021 1956 Last data filed at 09/08/2021 1800 Gross per 24 hour  Intake 1880.73 ml  Output 4 ml  Net 1876.73 ml   Filed Weights   09/04/21 1011 09/05/21 0401 09/06/21 0500  Weight: 116.4 kg 112.8 kg 115.4 kg    Examination:  General: No acute distress. Cardiovascular: RRR Lungs: unlabored Abdomen: Soft, mildly ttp Neurological: Alert and oriented 3. Moves all extremities 4. Cranial nerves II through XII grossly intact. Skin: Warm and dry. No rashes or lesions. Extremities: No clubbing or cyanosis. No edema.   Data Reviewed: I have personally reviewed following labs and imaging studies  CBC: Recent Labs  Lab 09/04/21 0457 09/05/21 0534 09/06/21 0534 09/07/21 0609 09/08/21 0514  WBC 12.8* 13.1* 16.4* 15.6* 14.9*  NEUTROABS 10.5* 11.0* 14.1* 13.1* 12.1*  HGB 13.0 13.1 13.4 12.9* 12.6*  HCT 39.8 39.1 40.1 38.4* 37.9*  MCV 91.7 89.9 89.3 89.1 90.0  PLT 249 229 230  246 646    Basic Metabolic Panel: Recent Labs  Lab 09/03/21 1009 09/04/21 0457 09/05/21 0534 09/06/21 0534 09/07/21 0609 09/08/21 0514  NA 136  --  134* 137 136 133*  K 4.4  --  3.8 4.1 4.6 4.7  CL 100  --  102 103 106 104  CO2 26  --  25 24 23  21*  GLUCOSE 100*  --  117* 118* 114* 99  BUN 20  --  13 14 18 18   CREATININE 1.15  --  1.00 1.03 1.02 0.90  CALCIUM 9.3  --  8.6* 8.9 8.3* 8.2*  MG  --  2.0 2.2 2.1 2.1 2.1   PHOS  --  3.3 2.9 3.4 3.9 3.5    GFR: Estimated Creatinine Clearance: 118.4 mL/min (by C-G formula based on SCr of 0.9 mg/dL).  Liver Function Tests: Recent Labs  Lab 09/03/21 1009 09/05/21 0534 09/06/21 0534 09/07/21 0609 09/08/21 0514  AST 25 22 18 26 27   ALT 42 32 28 33 36  ALKPHOS 107 98 92 99 105  BILITOT 0.9 0.7 0.6 0.6 0.5  PROT 7.7 7.2 7.4 7.1 6.9  ALBUMIN 3.6 3.1* 3.2* 3.0* 3.0*    CBG: Recent Labs  Lab 09/07/21 2332 09/08/21 0513 09/08/21 0754 09/08/21 1200 09/08/21 1712  GLUCAP 111* 95 112* 83 90     Recent Results (from the past 240 hour(s))  Resp Panel by RT-PCR (Flu Keyshon Stein&B, Covid) Nasopharyngeal Swab     Status: None   Collection Time: 09/02/21 10:16 PM   Specimen: Nasopharyngeal Swab; Nasopharyngeal(NP) swabs in vial transport medium  Result Value Ref Range Status   SARS Coronavirus 2 by RT PCR NEGATIVE NEGATIVE Final    Comment: (NOTE) SARS-CoV-2 target nucleic acids are NOT DETECTED.  The SARS-CoV-2 RNA is generally detectable in upper respiratory specimens during the acute phase of infection. The lowest concentration of SARS-CoV-2 viral copies this assay can detect is 138 copies/mL. Lynelle Weiler negative result does not preclude SARS-Cov-2 infection and should not be used as the sole basis for treatment or other patient management decisions. Melvia Matousek negative result may occur with  improper specimen collection/handling, submission of specimen other than nasopharyngeal swab, presence of viral mutation(s) within the areas targeted by this assay, and inadequate number of viral copies(<138 copies/mL). Lillyanne Bradburn negative result must be combined with clinical observations, patient history, and epidemiological information. The expected result is Negative.  Fact Sheet for Patients:  EntrepreneurPulse.com.au  Fact Sheet for Healthcare Providers:  IncredibleEmployment.be  This test is no t yet approved or cleared by the [place] FDA  and  has been authorized for detection and/or diagnosis of SARS-CoV-2 by FDA under an Emergency Use Authorization (EUA). This EUA will remain  in effect (meaning this test can be used) for the duration of the COVID-19 declaration under Section 564(b)(1) of the Act, 21 U.S.C.section 360bbb-3(b)(1), unless the authorization is terminated  or revoked sooner.       Influenza Sumit Branham by PCR NEGATIVE NEGATIVE Final   Influenza B by PCR NEGATIVE NEGATIVE Final    Comment: (NOTE) The Xpert Xpress SARS-CoV-2/FLU/RSV plus assay is intended as an aid in the diagnosis of influenza from Nasopharyngeal swab specimens and should not be used as Seeley Hissong sole basis for treatment. Nasal washings and aspirates are unacceptable for Xpert Xpress SARS-CoV-2/FLU/RSV testing.  Fact Sheet for Patients: EntrepreneurPulse.com.au  Fact Sheet for Healthcare Providers: IncredibleEmployment.be  This test is not yet approved or cleared by the [place] and has been authorized for  detection and/or diagnosis of SARS-CoV-2 by FDA under an Emergency Use Authorization (EUA). This EUA will remain in effect (meaning this test can be used) for the duration of the COVID-19 declaration under Section 564(b)(1) of the Act, 21 U.S.C. section 360bbb-3(b)(1), unless the authorization is terminated or revoked.  Performed at [facility], [place] [address] [place], [place] [address]          Radiology Studies: DG Abd 1 View  Result Date: 09/08/2021 CLINICAL DATA:  Abdominal pain, nausea, vomiting EXAM: ABDOMEN - 1 VIEW COMPARISON:  09/07/2021 FINDINGS: Prominent small bowel loops centrally again noted, similar prior study compatible with persistent small bowel obstruction. No free air or organomegaly. Visualized lung bases clear. IMPRESSION: Continued small bowel obstruction pattern, not significantly changed. Electronically Signed   By: Rolm Baptise M.D.   On: 09/08/2021  15:12   DG Abd 1 View  Result Date: 09/07/2021 CLINICAL DATA:  Nausea and vomiting; EXAM: ABDOMEN - 1 VIEW COMPARISON:  September 04, 2021 ;September 03, 2021; September 02, 2021 FINDINGS: There are Hallie Ishida few mildly dilated loops of bowel in the upper abdomen, improved in comparison to prior from September 03, 2021. Interval removal of enteric tube. Partial visualization of CVC tip terminating over the region of the superior cavoatrial junction. No bowel gas visualized in the rectum. IMPRESSION: Persistent mild dilation of several loops of small bowel in the upper abdomen, overall improved since September 03, 2021. This could reflect persistent small-bowel obstruction. Electronically Signed   By: Valentino Saxon M.D.   On: 09/07/2021 14:00        Scheduled Meds:  Chlorhexidine Gluconate Cloth  6 each Topical Daily   enoxaparin (LOVENOX) injection  40 mg Subcutaneous Daily   insulin aspart  0-9 Units Subcutaneous 4 times per day on Sun Mon   insulin aspart  0-9 Units Subcutaneous 4 times per day on Sun Mon   pantoprazole (PROTONIX) IV  40 mg Intravenous Q24H   Continuous Infusions:  lactated ringers 10 mL/hr at 84/69/62 9528   TPN CYCLIC-ADULT (ION) 88 mL/hr at 09/08/21 1944     LOS: 6 days    Time spent: over 66 min    Fayrene Helper, MD [facility]   To contact the attending provider between 7A-7P or the covering provider during after hours 7P-7A, please log into the web site [email or web address] and access using universal [place] password for that web site. If you do not have the password, please call the hospital operator.  09/08/2021, 7:56 PM

## 2021-09-09 ENCOUNTER — Encounter (HOSPITAL_COMMUNITY): Payer: Self-pay | Admitting: Internal Medicine

## 2021-09-09 DIAGNOSIS — K56609 Unspecified intestinal obstruction, unspecified as to partial versus complete obstruction: Secondary | ICD-10-CM | POA: Diagnosis not present

## 2021-09-09 LAB — CBC WITH DIFFERENTIAL/PLATELET
Abs Immature Granulocytes: 0.15 10*3/uL — ABNORMAL HIGH (ref 0.00–0.07)
Basophils Absolute: 0.1 10*3/uL (ref 0.0–0.1)
Basophils Relative: 0 %
Eosinophils Absolute: 0.2 10*3/uL (ref 0.0–0.5)
Eosinophils Relative: 2 %
HCT: 38.1 % — ABNORMAL LOW (ref 39.0–52.0)
Hemoglobin: 12.5 g/dL — ABNORMAL LOW (ref 13.0–17.0)
Immature Granulocytes: 1 %
Lymphocytes Relative: 6 %
Lymphs Abs: 0.8 10*3/uL (ref 0.7–4.0)
MCH: 29.7 pg (ref 26.0–34.0)
MCHC: 32.8 g/dL (ref 30.0–36.0)
MCV: 90.5 fL (ref 80.0–100.0)
Monocytes Absolute: 1.4 10*3/uL — ABNORMAL HIGH (ref 0.1–1.0)
Monocytes Relative: 11 %
Neutro Abs: 10.6 10*3/uL — ABNORMAL HIGH (ref 1.7–7.7)
Neutrophils Relative %: 80 %
Platelets: 260 10*3/uL (ref 150–400)
RBC: 4.21 MIL/uL — ABNORMAL LOW (ref 4.22–5.81)
RDW: 14.8 % (ref 11.5–15.5)
WBC: 13.3 10*3/uL — ABNORMAL HIGH (ref 4.0–10.5)
nRBC: 0 % (ref 0.0–0.2)

## 2021-09-09 LAB — COMPREHENSIVE METABOLIC PANEL
ALT: 32 U/L (ref 0–44)
AST: 23 U/L (ref 15–41)
Albumin: 3 g/dL — ABNORMAL LOW (ref 3.5–5.0)
Alkaline Phosphatase: 117 U/L (ref 38–126)
Anion gap: 6 (ref 5–15)
BUN: 21 mg/dL — ABNORMAL HIGH (ref 6–20)
CO2: 24 mmol/L (ref 22–32)
Calcium: 8.2 mg/dL — ABNORMAL LOW (ref 8.9–10.3)
Chloride: 103 mmol/L (ref 98–111)
Creatinine, Ser: 1.01 mg/dL (ref 0.61–1.24)
GFR, Estimated: >60 mL/min (ref >60)
Glucose, Bld: 113 mg/dL — ABNORMAL HIGH (ref 70–99)
Potassium: 4.5 mmol/L (ref 3.5–5.1)
Sodium: 133 mmol/L — ABNORMAL LOW (ref 135–145)
Total Bilirubin: 0.5 mg/dL (ref 0.3–1.2)
Total Protein: 7.2 g/dL (ref 6.5–8.1)

## 2021-09-09 LAB — GLUCOSE, CAPILLARY
Glucose-Capillary: 113 mg/dL — ABNORMAL HIGH (ref 70–99)
Glucose-Capillary: 120 mg/dL — ABNORMAL HIGH (ref 70–99)
Glucose-Capillary: 91 mg/dL (ref 70–99)
Glucose-Capillary: 81 mg/dL (ref 70–99)
Glucose-Capillary: 172 mg/dL — ABNORMAL HIGH (ref 70–99)
Glucose-Capillary: 137 mg/dL — ABNORMAL HIGH (ref 70–99)

## 2021-09-09 LAB — PHOSPHORUS: Phosphorus: 3.1 mg/dL (ref 2.5–4.6)

## 2021-09-09 LAB — MAGNESIUM: Magnesium: 2.1 mg/dL (ref 1.7–2.4)

## 2021-09-09 MED ORDER — HYDROMORPHONE HCL 1 MG/ML IJ SOLN
0.5000 mg | Freq: Once | INTRAMUSCULAR | Status: AC | PRN
  Administered 2021-09-09: 19:00:00 0.5 mg via INTRAVENOUS
  Filled 2021-09-09: qty 0.5

## 2021-09-09 MED ORDER — INSULIN ASPART 100 UNIT/ML IJ SOLN
0.0000 [IU] | INTRAMUSCULAR | Status: DC
  Administered 2021-09-09: 21:00:00 2 [IU] via SUBCUTANEOUS
  Administered 2021-09-09: 23:00:00 1 [IU] via SUBCUTANEOUS
  Administered 2021-09-10: 21:00:00 2 [IU] via SUBCUTANEOUS
  Administered 2021-09-10 – 2021-09-12 (×4): 1 [IU] via SUBCUTANEOUS
  Administered 2021-09-12: 20:00:00 2 [IU] via SUBCUTANEOUS
  Administered 2021-09-12 – 2021-09-13 (×2): 1 [IU] via SUBCUTANEOUS

## 2021-09-09 MED ORDER — CEFAZOLIN SODIUM-DEXTROSE 2-4 GM/100ML-% IV SOLN
2.0000 g | INTRAVENOUS | Status: AC
  Filled 2021-09-09 (×2): qty 100

## 2021-09-09 MED ORDER — TRAVASOL 10 % IV SOLN
INTRAVENOUS | Status: AC
  Filled 2021-09-09: qty 1254

## 2021-09-09 NOTE — Progress Notes (Signed)
PHARMACY - TOTAL PARENTERAL NUTRITION CONSULT NOTE   Indication:  Prolonged malnutrition due to  obstruction  Patient Measurements: Height: 5\' 11"  (180.3 cm) Weight: 113.8 kg (250 lb 14.1 oz) IBW/kg (Calculated) : 75.3 TPN AdjBW (KG): 77.3 Body mass index is 34.99 kg/m.  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 Oncology recommending TPN due to unlikely to tolerate po nutrition and likely need for venting G-tube.   Glucose / Insulin: no h/o DM, goal CBGs 100-150 - CBG range 113-135 while TPN on, 83 while TPN off - 1 units sensitive SSI required Electrolytes: Na low 133, K wnl (4.5) after decreased concentration yesterday Renal: SCr WNL, BUN slightly elevated (21) Hepatic: LFTs, T bili, and TG WNL Intake / Output: incomplete - UOP 4/h24, net I/O +1652ml MIVF: LR @ 10 ml/hr GI Imaging:  - CTAP 09/02/21 findings consistent with small-bowel obstruction, free fluid rt abdomen, small l pleural effusion, stable metastatic disease - 12/26 KUB: continued SBO pattern GI Surgeries / Procedures:  5/20 lap appy 3/22 ex lap  w/ partial bowel resection and ostomy creation 8/22 diagnostic laprascopy w/ peritoneal biopsise  Central access:  implanted port 12/12/20 TPN start date: 09/04/21  Nutritional Goals: Goal TPN rate is 95 mL/hr (provides 125 g of protein and 2234 kcals per day)  RD Assessment:  Estimated Needs Total Energy Estimated Needs: 2200-2400 Total Protein Estimated Needs: 115-125g Total Fluid Estimated Needs: 2,2L/day  Current Nutrition:  Clear liquids from 12/22 - CCS recommending not advance diet further, anticipate home on TPN; back to NPO 12/25 NG tube out, consider venting G-tube  Plan:  At 1800: Adjust TPN cyclic rate to 12 hr, start 6pm > end 6am Electrolytes in TPN:  Na 164mEq/L (increase), K 27mEq/L (decrease),  Ca 13mEq/L,  Mg 37mEq/L,   Phos 73mmol/L.  Cl:Ac 1:1 Add standard MVI and trace elements to TPN Sensitive SSI at 8p, 2a, 7a, 4p MIVF at [facility] - infuse only when TPN off Monitor TPN labs on Mon/Thurs, CMET/Mg/Phos in AM  Peggyann Juba, PharmD, BCPS Pharmacy: [phone] 09/09/2021,7:06 AM

## 2021-09-09 NOTE — Progress Notes (Signed)
PROGRESS NOTE    Ricardo [name]  IFO:[id number] DOB: May 08, 1966 DOA: 09/02/2021 PCP: Ricardo Pollen, MD  Chief Complaint  Patient presents with   Abdominal Pain   Emesis    Brief Narrative:  55 yo with hx metastatic colon cancer s/p partial bowel resection in March 2022, GERD, GAD, SBO who presented to [facility] with recurrent SBO.  Now awaiting venting g tube.  Dr. Burr [name] to see 12/28.      Assessment & Plan:   Principal Problem:   SBO (small bowel obstruction) (HCC) Active Problems:   Nausea and vomiting   Abdominal pain   Colon cancer metastasized to multiple sites [facility])   Pleural effusion, left   AKI (acute kidney injury) ([place])   Leukocytosis   GERD (gastroesophageal reflux disease)   GAD (generalized anxiety disorder)   Obesity (BMI 30-39.9)   * SBO (small bowel obstruction) ([place])- (present on admission) CT 12/20 with SBO  Appreciate surgical recs No surgical options at this point surgery recommending liquid diet long term (see 12/24 sign off note) - recommended liquid diet long term, venting g tube with IR if recurrent malignant obstructions Currently on TPN, plan to d/c home with TPN He's got continued discomfort - he's declining placement of NG tube - pending g tube placement Vomiting 12/24, KUB 12/26 with continued SBO pattern IR c/s for venting g tube -> plan for 12/28  Colon cancer metastasized to multiple sites [facility]) Follows with Dr. Burr [name] outpatient Follow outpatient, planning for continued chemo outpatient He asked me questions about second opinions (was asking about his evaluation for cytoreductive surgery and HIPEC by Dr. Clovis [name] - I reached out to Dr. Clovis [name] who reiterated he was not Ricardo [name] candidate).  Would defer further discussions to Dr. Burr [name].  Pleural effusion, left Small L effusion, possibly malignant Follow for symptoms  Leukocytosis- (present on admission) Fluctuating, follow  AKI (acute kidney injury) ([place])- (present on  admission) improved  GERD (gastroesophageal reflux disease)- (present on admission) PPI   DVT prophylaxis: lvoenox Code Status: partial, no mechanical ventilation Family Communication: none, son at bedside Disposition:   Status is: Inpatient  Remains inpatient appropriate because: continued sx management       Consultants:  oncology surgery  Procedures:  none  Antimicrobials:  Anti-infectives (From admission, onward)    Start     Dose/Rate Route Frequency Ordered Stop   09/10/21 0000  ceFAZolin (ANCEF) IVPB 2g/100 mL premix        2 g 200 mL/hr over 30 Minutes Intravenous To Radiology 09/09/21 1605 09/11/21 0000       Subjective: C/o discomfort  Objective: Vitals:   09/08/21 2107 09/09/21 0155 09/09/21 0205 09/09/21 1442  BP: 139/90 125/83  123/81  Pulse: 90 86  91  Resp: 18 18  20   Temp: 98.5 F (36.9 C) 99.2 F (37.3 C)  98.6 F (37 C)  TempSrc: Oral Oral  Oral  SpO2: 96% 95%  95%  Weight:   113.8 kg   Height:        Intake/Output Summary (Last 24 hours) at 09/09/2021 1920 Last data filed at 09/09/2021 1700 Gross per 24 hour  Intake 2120.67 ml  Output 1 ml  Net 2119.67 ml   Filed Weights   09/05/21 0401 09/06/21 0500 09/09/21 0205  Weight: 112.8 kg 115.4 kg 113.8 kg    Examination:  General: No acute distress. Cardiovascular: RRR Lungs: unlabored Abdomen: Soft, nontender, mildly distended, tinkering bowel sounds (rare) Neurological: Alert and oriented 3. Moves all  extremities 4. Cranial nerves II through XII grossly intact. Skin: Warm and dry. No rashes or lesions. Extremities: No clubbing or cyanosis. No edema.    Data Reviewed: I have personally reviewed following labs and imaging studies  CBC: Recent Labs  Lab 09/05/21 0534 09/06/21 0534 09/07/21 0609 09/08/21 0514 09/09/21 0503  WBC 13.1* 16.4* 15.6* 14.9* 13.3*  NEUTROABS 11.0* 14.1* 13.1* 12.1* 10.6*  HGB 13.1 13.4 12.9* 12.6* 12.5*  HCT 39.1 40.1 38.4* 37.9* 38.1*   MCV 89.9 89.3 89.1 90.0 90.5  PLT 229 230 246 243 299    Basic Metabolic Panel: Recent Labs  Lab 09/05/21 0534 09/06/21 0534 09/07/21 0609 09/08/21 0514 09/09/21 0503  NA 134* 137 136 133* 133*  K 3.8 4.1 4.6 4.7 4.5  CL 102 103 106 104 103  CO2 25 24 23  21* 24  GLUCOSE 117* 118* 114* 99 113*  BUN 13 14 18 18  21*  CREATININE 1.00 1.03 1.02 0.90 1.01  CALCIUM 8.6* 8.9 8.3* 8.2* 8.2*  MG 2.2 2.1 2.1 2.1 2.1  PHOS 2.9 3.4 3.9 3.5 3.1    GFR: Estimated Creatinine Clearance: 104.8 mL/min (by C-G formula based on SCr of 1.01 mg/dL).  Liver Function Tests: Recent Labs  Lab 09/05/21 0534 09/06/21 0534 09/07/21 0609 09/08/21 0514 09/09/21 0503  AST 22 18 26 27 23   ALT 32 28 33 36 32  ALKPHOS 98 92 99 105 117  BILITOT 0.7 0.6 0.6 0.5 0.5  PROT 7.2 7.4 7.1 6.9 7.2  ALBUMIN 3.1* 3.2* 3.0* 3.0* 3.0*    CBG: Recent Labs  Lab 09/08/21 2106 09/09/21 0150 09/09/21 0739 09/09/21 1212 09/09/21 1731  GLUCAP 135* 113* 120* 91 81     Recent Results (from the past 240 hour(s))  Resp Panel by RT-PCR (Flu Ricardo [name]&B, Covid) Nasopharyngeal Swab     Status: None   Collection Time: 09/02/21 10:16 PM   Specimen: Nasopharyngeal Swab; Nasopharyngeal(NP) swabs in vial transport medium  Result Value Ref Range Status   SARS Coronavirus 2 by RT PCR NEGATIVE NEGATIVE Final    Comment: (NOTE) SARS-CoV-2 target nucleic acids are NOT DETECTED.  The SARS-CoV-2 RNA is generally detectable in upper respiratory specimens during the acute phase of infection. The lowest concentration of SARS-CoV-2 viral copies this assay can detect is 138 copies/mL. Ricardo [name] negative result does not preclude SARS-Cov-2 infection and should not be used as the sole basis for treatment or other patient management decisions. Ricardo [name] negative result may occur with  improper specimen collection/handling, submission of specimen other than nasopharyngeal swab, presence of viral mutation(s) within the areas targeted by this assay,  and inadequate number of viral copies(<138 copies/mL). Ricardo [name] negative result must be combined with clinical observations, patient history, and epidemiological information. The expected result is Negative.  Fact Sheet for Patients:  EntrepreneurPulse.com.au  Fact Sheet for Healthcare Providers:  IncredibleEmployment.be  This test is no t yet approved or cleared by the [place] FDA and  has been authorized for detection and/or diagnosis of SARS-CoV-2 by FDA under an Emergency Use Authorization (EUA). This EUA will remain  in effect (meaning this test can be used) for the duration of the COVID-19 declaration under Section 564(b)(1) of the Act, 21 U.S.C.section 360bbb-3(b)(1), unless the authorization is terminated  or revoked sooner.       Influenza Amilio Zehnder by PCR NEGATIVE NEGATIVE Final   Influenza B by PCR NEGATIVE NEGATIVE Final    Comment: (NOTE) The Xpert Xpress SARS-CoV-2/FLU/RSV plus assay is intended as an aid in  the diagnosis of influenza from Nasopharyngeal swab specimens and should not be used as [place] sole basis for treatment. Nasal washings and aspirates are unacceptable for Xpert Xpress SARS-CoV-2/FLU/RSV testing.  Fact Sheet for Patients: EntrepreneurPulse.com.au  Fact Sheet for Healthcare Providers: IncredibleEmployment.be  This test is not yet approved or cleared by the [place] FDA and has been authorized for detection and/or diagnosis of SARS-CoV-2 by FDA under an Emergency Use Authorization (EUA). This EUA will remain in effect (meaning this test can be used) for the duration of the COVID-19 declaration under Section 564(b)(1) of the Act, 21 U.S.C. section 360bbb-3(b)(1), unless the authorization is terminated or revoked.  Performed at [facility], [place] [address] [place], [place] [address]          Radiology Studies: DG Abd 1 View  Result Date:  09/08/2021 CLINICAL DATA:  Abdominal pain, nausea, vomiting EXAM: ABDOMEN - 1 VIEW COMPARISON:  09/07/2021 FINDINGS: Prominent small bowel loops centrally again noted, similar prior study compatible with persistent small bowel obstruction. No free air or organomegaly. Visualized lung bases clear. IMPRESSION: Continued small bowel obstruction pattern, not significantly changed. Electronically Signed   By: Rolm Baptise M.D.   On: 09/08/2021 15:12        Scheduled Meds:  Chlorhexidine Gluconate Cloth  6 each Topical Daily   enoxaparin (LOVENOX) injection  40 mg Subcutaneous Daily   insulin aspart  0-9 Units Subcutaneous 4 times per day on Sun Mon   insulin aspart  0-9 Units Subcutaneous 4 times per day   pantoprazole (PROTONIX) IV  40 mg Intravenous Q24H   Continuous Infusions:  [START ON 09/10/2021]  ceFAZolin (ANCEF) IV     lactated ringers 10 mL/hr at 66/59/93 5701   TPN CYCLIC-ADULT (ION) 779 mL/hr at 09/09/21 1837     LOS: 7 days    Time spent: over 30 min    Fayrene Helper, MD [facility]   To contact the attending provider between 7A-7P or the covering provider during after hours 7P-7A, please log into the web site [email or web address] and access using universal [place] password for that web site. If you do not have the password, please call the hospital operator.  09/09/2021, 7:20 PM

## 2021-09-09 NOTE — Consult Note (Signed)
Chief Complaint: Patient was seen in consultation today for image guided venting G tube placement Chief Complaint  Patient presents with   Abdominal Pain   Emesis   at the request of Elodia Florence  Referring Physician(s): Elodia Florence  Supervising Physician: Corrie Mckusick  Patient Status: [facility] - In-pt  History of Present Illness: Ricardo [name] is a 55 y.o. male with PMH of metastatic colon cancer s/p partial bowel resection in March 2022, currently hospitalized due to recurrent SBO.  General surgery was consulted and patient was placed on NG decompression but he was not able to tolerate NG tube. Due to extensive extensive peritoneal metastasis, patient is not a candidate for surgery for the recurrent, persistent SBO.  IR was requested for a venting G tube placement.  Case was reviewed and approved by Dr. Anselm Pancoast.   Patient laying in bed, not in acute distress. Wife at bedside.   Patient states that he just could not tolerate NG tube, it made his throat extremely sore and the NG tube kept clogging.  He states that he had a G tube before and he was able to manage it fine, he is in dire need of some intervention to increase the quality of his life and would like to proceed with the venting G tube placement.  Reports abdominal distension, N/V.  Denise headache, fever, chills, shortness of breath, cough, chest pain, abdominal pain,  and bleeding.   Past Medical History:  Diagnosis Date   Arthritis    Colon cancer ([place])    with metastasis   Family history of adverse reaction to anesthesia    mother had problem with it due to her asthma   Family history of breast cancer    Family history of prostate cancer     Past Surgical History:  Procedure Laterality Date   BOWEL RESECTION N/A 11/29/2020   Procedure: PARTIAL BOWEL RESECTION;  Surgeon: Jovita Kussmaul, MD;  Location: [facility] OR;  Service: General;  Laterality: N/A;   GASTROSTOMY Left 11/29/2020   Procedure:  INSERTION OF GASTROSTOMY TUBE;  Surgeon: Jovita Kussmaul, MD;  Location: [place];  Service: General;  Laterality: Left;   IR IMAGING GUIDED PORT INSERTION  12/12/2020   LAPAROSCOPIC APPENDECTOMY N/A 01/17/2019   Procedure: APPENDECTOMY LAPAROSCOPIC;  Surgeon: Ralene Ok, MD;  Location: [place];  Service: General;  Laterality: N/A;   LAPAROTOMY N/A 11/29/2020   Procedure: EXPLORATORY LAPAROTOMY;  Surgeon: Jovita Kussmaul, MD;  Location: [place];  Service: General;  Laterality: N/A;  PUT CASE IN ROOM 1 STARTING AT 9:30AM FOR 120 MIN   OSTOMY N/A 11/29/2020   Procedure: POSSIBLE OSTOMY CREATION;  Surgeon: Jovita Kussmaul, MD;  Location: [place];  Service: General;  Laterality: N/A;   RECTAL BIOPSY N/A 11/29/2020   Procedure: PERITIONEAL BIOPSY;  Surgeon: Jovita Kussmaul, MD;  Location: [place];  Service: General;  Laterality: N/A;   SHOULDER SURGERY Left 09/19/2015    Allergies: Oxaliplatin  Medications: Prior to Admission medications   Medication Sig Start Date End Date Taking? Authorizing Provider  ALPRAZolam (XANAX) 0.25 MG tablet Take 1-2 tablets (0.25-0.5 mg total) by mouth at bedtime as needed for anxiety or sleep. 07/17/21  Yes [name], MD  dicyclomine (BENTYL) 10 MG capsule TAKE 1 CAPSULE (10 MG TOTAL) BY MOUTH 4 (FOUR) TIMES DAILY - BEFORE MEALS AND AT BEDTIME. Patient taking differently: Take 10 mg by mouth daily as needed (for constipation). 04/18/21  Yes Cira Rue  K, NP  OVER THE COUNTER MEDICATION Take 1 capsule by mouth daily. Immunocal   Yes [provider]  oxyCODONE (OXY IR/ROXICODONE) 5 MG immediate release tablet Take 1 tablet (5 mg total) by mouth every 8 (eight) hours as needed for severe pain. Patient taking differently: Take 5-10 mg by mouth every 8 (eight) hours as needed for severe pain. 07/17/21  Yes [name], MD  pantoprazole (PROTONIX) 40 MG tablet Take 1 tablet (40 mg total) by mouth daily. 08/29/21 08/29/22 Yes Pokhrel, Corrie Mckusick, MD  prochlorperazine (COMPAZINE) 10 MG  tablet Take 1 tablet (10 mg total) by mouth every 6 (six) hours as needed for nausea or vomiting. 01/16/21  Yes [name], MD  acetaminophen (TYLENOL) 500 MG tablet Take 500-1,000 mg by mouth every 6 (six) hours as needed for mild pain. Patient not taking: Reported on 09/03/2021    [provider]  hydrocortisone (ANUSOL-HC) 2.5 % rectal cream Place 1 application rectally 2 (two) times daily. Patient not taking: Reported on 08/27/2021 03/27/21   Alla Feeling, NP  polyethylene glycol (MIRALAX / GLYCOLAX) 17 g packet Take 17 g by mouth daily as needed for mild constipation. Patient not taking: Reported on 09/03/2021    [provider]     Family History  Problem Relation Age of Onset   Cancer Sister 74       breast cancer   Cancer Brother 61       prostate cancer    High Cholesterol Brother    Pancreatic cancer Maternal Uncle    Bone cancer Cousin        pat first cousin   Colon cancer Neg Hx    Liver disease Neg Hx    Esophageal cancer Neg Hx    Stomach cancer Neg Hx     Social History   Socioeconomic History   Marital status: Married    Spouse name: Not on file   Number of children: Not on file   Years of education: Not on file   Highest education level: Not on file  Occupational History   Not on file  Tobacco Use   Smoking status: Never   Smokeless tobacco: Never  Vaping Use   Vaping Use: Never used  Substance and Sexual Activity   Alcohol use: Not Currently    Comment: rare   Drug use: Never   Sexual activity: Not Currently  Other Topics Concern   Not on file  Social History Narrative   Not on file   Social Determinants of Health   Financial Resource Strain: Not on file  Food Insecurity: Not on file  Transportation Needs: Not on file  Physical Activity: Not on file  Stress: Not on file  Social Connections: Not on file     Review of Systems: A 12 point ROS discussed and pertinent positives are indicated in the HPI above.  All other  systems are negative.  Vital Signs: BP 123/81 (BP Location: Left Arm)    Pulse 91    Temp 98.6 F (37 C) (Oral)    Resp 20    Ht 5\' 11"  (1.803 m)    Wt 250 lb 14.1 oz (113.8 kg)    SpO2 95%    BMI 34.99 kg/m    Physical Exam Vitals reviewed.  Constitutional:      General: He is not in acute distress.    Appearance: He is not ill-appearing.  HENT:     Head: Normocephalic and atraumatic.  Cardiovascular:  Rate and Rhythm: Normal rate and regular rhythm.  Pulmonary:     Effort: Pulmonary effort is normal.     Breath sounds: Normal breath sounds.  Abdominal:     General: Abdomen is protuberant. There is distension.  Skin:    General: Skin is warm and dry.     Coloration: Skin is not cyanotic or jaundiced.  Neurological:     Mental Status: He is alert and oriented to person, place, and time.  Psychiatric:        Mood and Affect: Mood normal.        Behavior: Behavior normal.    MD Evaluation Airway: WNL Heart: WNL Abdomen: WNL Chest/ Lungs: WNL ASA  Classification: 3 Mallampati/Airway Score: Two  Imaging: CT ABDOMEN PELVIS WO CONTRAST  Result Date: 08/27/2021 CLINICAL DATA:  Small-bowel obstruction with left lower quadrant pain. Leukocytosis also. Metastatic colon cancer. Ongoing chemotherapy. EXAM: CT ABDOMEN AND PELVIS WITHOUT CONTRAST TECHNIQUE: Multidetector CT imaging of the abdomen and pelvis was performed following the standard protocol without IV contrast. COMPARISON:  The recent CT with IV contrast 08/25/2021, CT abdomen and pelvis with IV and oral contrast 07/01/2021. FINDINGS: Lower chest: There is slight increased prominence of a few scattered epipericardial lymph nodes compared with 07/01/2021. No acute lung base findings. Hepatobiliary: No focal liver abnormality is seen. No gallstones, gallbladder wall thickening, or biliary dilatation. Pancreas: Unremarkable. No pancreatic ductal dilatation or surrounding inflammatory changes. Spleen: Unremarkable without  contrast. Adrenals/Urinary Tract: No focal abnormality in the adrenal glands and renal cortex. No urinary stones or obstruction. Chronic perinephric stranding is similar. Normal bladder thickness. Stomach/Bowel: There is increasing dilatation of left upper to mid abdominal small bowel and anterior central small bowel up to 5 cm, although there is still relative decompressed appearance in the stomach and duodenum. The dilatation begins to be seen distal to the ligament of Treitz. The exact transition point could not be found but I suspect is probably related to abdominal wall adhesive disease with multiple small bowel segments closely abutting the anterior wall on multiple slices. The transitional segment is probably in the right mid to lower abdomen anteriorly. The appendix surgically absent. Rest of the small bowel is decompressed, some of it apparently having been removed. The large intestine wall is unremarkable except for uncomplicated diverticula along the left segments. Vascular/Lymphatic: There is mild aortoiliac calcific plaque. No enlarged lymph nodes are seen. There is a stable subcentimeter to borderline sized ileocolic mesenteric lymph node in the right lower abdomen on axial 50. Reproductive: Normal prostate. Other: Diffuse haziness in omentum extends to the subphrenic space on the left. There is increased minimal ascites in the perihepatic space and right anterior abdominal mesenteric folds. There are small inguinal fat hernias. Musculoskeletal: Chronic L5 pars defects and grade 1 L5 spondylolisthesis. No destructive bone lesions. 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 IMPRESSION: 1. Intermediate to high-grade obstruction to approximately the mid small bowel, etiology is probably adhesive disease and the transition is most likely in the anterior right mid to lower abdomen along the abdominal wall. The exact transitional segment could not be located without contrast. 2. Slight  increased prominence of a few epipericardial lymph nodes. Small stable ileocolic mesenteric node. 3. Diffuse omental haziness extending into the left subphrenic space concerning for metastatic disease, with small volume of ascites. No free air. 4. Aortic atherosclerosis.  Additional findings as above. Electronically Signed   By: Telford Nab M.D.   On: 08/27/2021 04:19   DG Abd 1  View  Result Date: 09/08/2021 CLINICAL DATA:  Abdominal pain, nausea, vomiting EXAM: ABDOMEN - 1 VIEW COMPARISON:  09/07/2021 FINDINGS: Prominent small bowel loops centrally again noted, similar prior study compatible with persistent small bowel obstruction. No free air or organomegaly. Visualized lung bases clear. IMPRESSION: Continued small bowel obstruction pattern, not significantly changed. Electronically Signed   By: Rolm Baptise M.D.   On: 09/08/2021 15:12   DG Abd 1 View  Result Date: 09/07/2021 CLINICAL DATA:  Nausea and vomiting; EXAM: ABDOMEN - 1 VIEW COMPARISON:  September 04, 2021 ;September 03, 2021; September 02, 2021 FINDINGS: There are a few mildly dilated loops of bowel in the upper abdomen, improved in comparison to prior from September 03, 2021. Interval removal of enteric tube. Partial visualization of CVC tip terminating over the region of the superior cavoatrial junction. No bowel gas visualized in the rectum. IMPRESSION: Persistent mild dilation of several loops of small bowel in the upper abdomen, overall improved since September 03, 2021. This could reflect persistent small-bowel obstruction. Electronically Signed   By: Valentino Saxon M.D.   On: 09/07/2021 14:00   DG Abdomen 1 View  Result Date: 09/03/2021 CLINICAL DATA:  Nasogastric tube placement EXAM: ABDOMEN - 1 VIEW COMPARISON:  None. FINDINGS: Nasogastric tube tip overlies the expected mid body of the stomach. Multiple gas-filled dilated loops of small bowel are seen within the mid abdomen and left upper quadrant. No free intraperitoneal gas.  IMPRESSION: Nasogastric tube tip within the mid body of the stomach. Electronically Signed   By: Fidela Salisbury M.D.   On: 09/03/2021 00:51   CT ABDOMEN PELVIS W CONTRAST  Result Date: 09/02/2021 CLINICAL DATA:  Bowel obstruction suspected. Abdominal pain. Chemotherapy. Three weeks ago. EXAM: CT ABDOMEN AND PELVIS WITH CONTRAST TECHNIQUE: Multidetector CT imaging of the abdomen and pelvis was performed using the standard protocol following bolus administration of intravenous contrast. CONTRAST:  61mL OMNIPAQUE IOHEXOL 350 MG/ML SOLN COMPARISON:  CT abdomen and pelvis 08/27/2021. FINDINGS: Lower chest: There is a new trace left pleural effusion. There is atelectasis in the bilateral lung bases. Hepatobiliary: No focal liver abnormality is seen. No gallstones, gallbladder wall thickening, or biliary dilatation. Pancreas: Unremarkable. No pancreatic ductal dilatation or surrounding inflammatory changes. Spleen: Normal in size without focal abnormality. Adrenals/Urinary Tract: Adrenal glands are unremarkable. Kidneys are normal, without renal calculi, focal lesion, or hydronephrosis. Bladder is unremarkable. Stomach/Bowel: There are dilated mid and proximal small bowel loops with air-fluid levels measuring up to 5 cm. Degree of distention has mildly increased. There is also new moderate air-fluid level within the stomach. Transition point is likely in the mid abdomen where there are multiple small bowel loops demonstrating adhesive disease to the anterior abdominal wall. This finding is unchanged. The colon is nondilated. The appendix is not seen. There is no pneumatosis or free air identified. Vascular/Lymphatic: Aortic atherosclerosis. No enlarged abdominal or pelvic lymph nodes. Nonenlarged cardiophrenic and ileocecal lymph nodes are unchanged. Reproductive: Prostate is unremarkable. Other: There are small fat containing bilateral inguinal hernias. There is a small amount of free fluid in the anterior right  abdomen which has mildly increased. There is mesenteric/omental haziness in the left upper quadrant and anterior upper abdomen similar to the prior examination. Musculoskeletal: There are bilateral pars interarticularis defects at L5 without significant listhesis. IMPRESSION: 1. Again seen are findings of small-bowel obstruction. Transition point is likely in the mid abdomen where there are clustered small bowel loops adhered to the anterior abdominal wall. Can not exclude underlying  obstructive lesion. Degree of distention has increased when compared to the prior exam. There is now air-fluid level in the stomach. 2. Increasing free fluid in the right abdomen. 3. New small left pleural effusion. 4. Stable diffuse omental haziness is unchanged, concerning for metastatic disease. Electronically Signed   By: Ronney Asters M.D.   On: 09/02/2021 20:30   CT ABDOMEN PELVIS W CONTRAST  Result Date: 08/25/2021 CLINICAL DATA:  55 year old male with history of nausea and vomiting. Suspected bowel obstruction. EXAM: CT ABDOMEN AND PELVIS WITH CONTRAST TECHNIQUE: Multidetector CT imaging of the abdomen and pelvis was performed using the standard protocol following bolus administration of intravenous contrast. CONTRAST:  55mL OMNIPAQUE IOHEXOL 350 MG/ML SOLN COMPARISON:  CT of the abdomen and pelvis 07/01/2021. FINDINGS: Lower chest: Tip of central venous catheter terminating at the superior cavoatrial junction. Mild scarring in the posterior aspect of the left upper lobe. Hepatobiliary: No definite suspicious cystic or solid hepatic lesions. No intra or extrahepatic biliary ductal dilatation. Gallbladder is normal in appearance. Pancreas: No pancreatic mass. No pancreatic ductal dilatation. No pancreatic or peripancreatic fluid collections or inflammatory changes. Spleen: Unremarkable. Adrenals/Urinary Tract: Bilateral kidneys and adrenal glands are normal in appearance. No hydroureteronephrosis. Urinary bladder is normal  in appearance. Stomach/Bowel: The appearance of the stomach is normal. No pathologic dilatation of small bowel or colon. Status post appendectomy. Mass-like soft tissue thickening in the region of the terminal ileum best appreciated on axial images 52 and 53 of series 2. Vascular/Lymphatic: Aortic atherosclerosis. No lymphadenopathy noted in the abdomen or pelvis. Prominent but nonenlarged ileocolic lymph node in the right lower quadrant measuring 6 mm in short axis (axial image 50 of series 2), stable compared to the prior study, nonspecific. Reproductive: Prostate gland and seminal vesicles are unremarkable in appearance. Other: There continues to be some mild diffuse haziness throughout the omentum. Scattered volume of trace ascites is noted, most evident in the right-side of the abdomen (axial image 43 of series 2). No large peritoneal soft tissue masses are confidently identified. No pneumoperitoneum. Musculoskeletal: There are no aggressive appearing lytic or blastic lesions noted in the visualized portions of the skeleton. IMPRESSION: 1. No findings to suggest bowel obstruction. 2. However, there are findings suggestive of intraperitoneal metastatic disease, including residual small volume of ascites and diffuse haziness throughout the omentum, which has increased slightly compared to the prior examination. 3. Aortic atherosclerosis. 4. Additional incidental findings, as above. Electronically Signed   By: Vinnie Langton M.D.   On: 08/25/2021 07:36   DG ABD ACUTE 2+V W 1V CHEST  Result Date: 08/27/2021 CLINICAL DATA:  Metastatic colon cancer. Abdominal pain left lower quadrant with vomiting. EXAM: DG ABDOMEN ACUTE WITH 1 VIEW CHEST COMPARISON:  CT with IV contrast 08/25/2021 FINDINGS: There is increased dilatation of left mid to lower abdominal small bowel up to 5 cm concerning for small bowel obstruction. Small amount of scattered gas and stool remain present in the colon. There are stable visceral  shadows. There is no evidence of free air. No pathologic calcifications. The lungs are generally clear aside from perihilar linear atelectatic changes on the left. No pleural effusion is seen. The cardiac size is normal. Right IJ port catheter tip remains at the cavoatrial junction. IMPRESSION: Increased dilatation of the left abdominal small bowel up to 5 cm concerning for small bowel obstruction. In all other respects no further changes. Electronically Signed   By: Telford Nab M.D.   On: 08/27/2021 02:38   DG Abd Portable  1V-Small Bowel Obstruction Protocol-24 hr delay  Result Date: 09/04/2021 CLINICAL DATA:  Small-bowel obstruction. Bowel obstruction protocol/24 delay image. EXAM: PORTABLE ABDOMEN - 1 VIEW COMPARISON:  Radiographs 09/03/2021 and 08/27/2021.  CT 09/02/2021. FINDINGS: 1120 hours. Two views obtained. Tip of the nasogastric tube projects over the distal stomach. The enteric contrast has passed into the colon which appears decompressed. Previously noted small bowel distension is improved. No extraluminal contrast or air collections are identified. Telemetry leads overlie the chest and upper abdomen. IMPRESSION: Antegrade passage of contrast into decompressed colon with improved small bowel dilatation. No evidence of bowel obstruction or perforation. Electronically Signed   By: Richardean Sale M.D.   On: 09/04/2021 13:43   DG Abd Portable 1V-Small Bowel Obstruction Protocol-initial, 8 hr delay  Result Date: 09/03/2021 CLINICAL DATA:  Small-bowel obstruction. EXAM: PORTABLE ABDOMEN - 1 VIEW COMPARISON:  09/03/2021 FINDINGS: Nasogastric tube overlies expected mid body of the stomach. Contrast is seen within the gastric fundus. There has been little antegrade passage of contrast into the remainder of the abdomen. Multiple loops of gas-filled dilated small bowel are seen within the epigastrium and left abdomen in keeping with changes of a mid small bowel obstruction. No gross free  intraperitoneal gas. No organomegaly. IMPRESSION: No significant antegrade passage of contrast from the gastric lumen. Persistent findings in keeping with a mid small bowel obstruction. Electronically Signed   By: Fidela Salisbury M.D.   On: 09/03/2021 19:48   DG Abd Portable 1V-Small Bowel Obstruction Protocol-initial, 8 hr delay  Result Date: 08/27/2021 CLINICAL DATA:  Small bowel obstruction.  History of colon cancer. EXAM: PORTABLE ABDOMEN - 1 VIEW COMPARISON:  Same day. FINDINGS: Residual contrast is seen through the nondilated colon. Mildly dilated small bowel loops are noted in the upper abdomen concerning for ileus or distal small bowel obstruction. IMPRESSION: Mildly dilated small bowel loops are again noted in upper abdomen concerning for distal small bowel obstruction. Residual contrast is noted in nondilated colon. Electronically Signed   By: Marijo Conception M.D.   On: 08/27/2021 21:41    Labs:  CBC: Recent Labs    09/06/21 0534 09/07/21 0609 09/08/21 0514 09/09/21 0503  WBC 16.4* 15.6* 14.9* 13.3*  HGB 13.4 12.9* 12.6* 12.5*  HCT 40.1 38.4* 37.9* 38.1*  PLT 230 246 243 260    COAGS: Recent Labs    01/04/21 0357  INR 1.2    BMP: Recent Labs    09/06/21 0534 09/07/21 0609 09/08/21 0514 09/09/21 0503  NA 137 136 133* 133*  K 4.1 4.6 4.7 4.5  CL 103 106 104 103  CO2 24 23 21* 24  GLUCOSE 118* 114* 99 113*  BUN 14 18 18  21*  CALCIUM 8.9 8.3* 8.2* 8.2*  CREATININE 1.03 1.02 0.90 1.01  GFRNONAA >60 >60 >60 >60    LIVER FUNCTION TESTS: Recent Labs    09/06/21 0534 09/07/21 0609 09/08/21 0514 09/09/21 0503  BILITOT 0.6 0.6 0.5 0.5  AST 18 26 27 23   ALT 28 33 36 32  ALKPHOS 92 99 105 117  PROT 7.4 7.1 6.9 7.2  ALBUMIN 3.2* 3.0* 3.0* 3.0*    TUMOR MARKERS: No results for input(s): AFPTM, CEA, CA199, CHROMGRNA in the last 8760 hours.  Assessment and Plan: 55 y.o. male with metastatic colon cancer s/p partial bowel resection in March 2022, currently  hospitalized due to recurrent SBO. Patient could not tolerate NGT decompression.   IR was requested for venting G tube placement. Case was reviewed and approved  by Dr. Anselm Pancoast.   The procedure is tentatively scheduled for tomorrow pending IR schedule.  Pt is NPO  VSS Persistent leukocytosis but pt does not show sign of acute infection  INR 1.2 on 01/04/21  not on AC/AP as out pt, no hx of liver dz - will update  On Lovenox, held tomorrow's dose   Thank you for this interesting consult.  I greatly enjoyed meeting ISIAC BREIGHNER and look forward to participating in their care.  A copy of this report was sent to the requesting provider on this date.  Electronically Signed: Tera Mater, PA-C 09/09/2021, 3:32 PM   I spent a total of 30 min   in face to face in clinical consultation, greater than 50% of which was counseling/coordinating care for venting G tube placement   This chart was dictated using voice recognition software.  Despite best efforts to proofread,  errors can occur which can change the documentation meaning.

## 2021-09-10 DIAGNOSIS — K56609 Unspecified intestinal obstruction, unspecified as to partial versus complete obstruction: Secondary | ICD-10-CM | POA: Diagnosis not present

## 2021-09-10 DIAGNOSIS — K219 Gastro-esophageal reflux disease without esophagitis: Secondary | ICD-10-CM | POA: Diagnosis not present

## 2021-09-10 DIAGNOSIS — C772 Secondary and unspecified malignant neoplasm of intra-abdominal lymph nodes: Secondary | ICD-10-CM

## 2021-09-10 DIAGNOSIS — C189 Malignant neoplasm of colon, unspecified: Secondary | ICD-10-CM

## 2021-09-10 DIAGNOSIS — R1084 Generalized abdominal pain: Secondary | ICD-10-CM | POA: Diagnosis not present

## 2021-09-10 DIAGNOSIS — N179 Acute kidney failure, unspecified: Secondary | ICD-10-CM | POA: Diagnosis not present

## 2021-09-10 LAB — GLUCOSE, CAPILLARY
Glucose-Capillary: 117 mg/dL — ABNORMAL HIGH (ref 70–99)
Glucose-Capillary: 124 mg/dL — ABNORMAL HIGH (ref 70–99)
Glucose-Capillary: 150 mg/dL — ABNORMAL HIGH (ref 70–99)
Glucose-Capillary: 151 mg/dL — ABNORMAL HIGH (ref 70–99)
Glucose-Capillary: 83 mg/dL (ref 70–99)

## 2021-09-10 LAB — CBC WITH DIFFERENTIAL/PLATELET
Abs Immature Granulocytes: 0.1 10*3/uL — ABNORMAL HIGH (ref 0.00–0.07)
Basophils Absolute: 0 10*3/uL (ref 0.0–0.1)
Basophils Relative: 0 %
Eosinophils Absolute: 0.2 10*3/uL (ref 0.0–0.5)
Eosinophils Relative: 1 %
HCT: 36.3 % — ABNORMAL LOW (ref 39.0–52.0)
Hemoglobin: 12 g/dL — ABNORMAL LOW (ref 13.0–17.0)
Immature Granulocytes: 1 %
Lymphocytes Relative: 5 %
Lymphs Abs: 0.7 10*3/uL (ref 0.7–4.0)
MCH: 29.3 pg (ref 26.0–34.0)
MCHC: 33.1 g/dL (ref 30.0–36.0)
MCV: 88.8 fL (ref 80.0–100.0)
Monocytes Absolute: 1.4 10*3/uL — ABNORMAL HIGH (ref 0.1–1.0)
Monocytes Relative: 10 %
Neutro Abs: 11.3 10*3/uL — ABNORMAL HIGH (ref 1.7–7.7)
Neutrophils Relative %: 83 %
Platelets: 285 10*3/uL (ref 150–400)
RBC: 4.09 MIL/uL — ABNORMAL LOW (ref 4.22–5.81)
RDW: 14.8 % (ref 11.5–15.5)
WBC: 13.7 10*3/uL — ABNORMAL HIGH (ref 4.0–10.5)
nRBC: 0 % (ref 0.0–0.2)

## 2021-09-10 LAB — COMPREHENSIVE METABOLIC PANEL
ALT: 26 U/L (ref 0–44)
AST: 18 U/L (ref 15–41)
Albumin: 2.8 g/dL — ABNORMAL LOW (ref 3.5–5.0)
Alkaline Phosphatase: 108 U/L (ref 38–126)
Anion gap: 7 (ref 5–15)
BUN: 23 mg/dL — ABNORMAL HIGH (ref 6–20)
CO2: 23 mmol/L (ref 22–32)
Calcium: 8.4 mg/dL — ABNORMAL LOW (ref 8.9–10.3)
Chloride: 106 mmol/L (ref 98–111)
Creatinine, Ser: 0.96 mg/dL (ref 0.61–1.24)
GFR, Estimated: >60 mL/min (ref >60)
Glucose, Bld: 136 mg/dL — ABNORMAL HIGH (ref 70–99)
Potassium: 4.6 mmol/L (ref 3.5–5.1)
Sodium: 136 mmol/L (ref 135–145)
Total Bilirubin: 0.6 mg/dL (ref 0.3–1.2)
Total Protein: 6.9 g/dL (ref 6.5–8.1)

## 2021-09-10 LAB — PROTIME-INR
INR: 1.2 (ref 0.8–1.2)
Prothrombin Time: 15.4 seconds — ABNORMAL HIGH (ref 11.4–15.2)

## 2021-09-10 LAB — MAGNESIUM: Magnesium: 2.3 mg/dL (ref 1.7–2.4)

## 2021-09-10 LAB — PHOSPHORUS: Phosphorus: 3 mg/dL (ref 2.5–4.6)

## 2021-09-10 LAB — TRIGLYCERIDES: Triglycerides: 54 mg/dL

## 2021-09-10 MED ORDER — HYDROMORPHONE HCL 1 MG/ML IJ SOLN
0.5000 mg | Freq: Once | INTRAMUSCULAR | Status: AC | PRN
  Administered 2021-09-10: 05:00:00 0.5 mg via INTRAVENOUS
  Filled 2021-09-10: qty 0.5

## 2021-09-10 MED ORDER — TRAVASOL 10 % IV SOLN
INTRAVENOUS | Status: AC
  Filled 2021-09-10: qty 1254

## 2021-09-10 MED ORDER — PROCHLORPERAZINE EDISYLATE 10 MG/2ML IJ SOLN
5.0000 mg | Freq: Once | INTRAMUSCULAR | Status: AC
  Administered 2021-09-10: 05:00:00 5 mg via INTRAVENOUS
  Filled 2021-09-10: qty 2

## 2021-09-10 NOTE — Progress Notes (Signed)
Ricardo [name]   DOB:Oct 05, 1965   ZH#:[id number]   GEX#:[id number]  Oncology follow up   Subjective: Patient had recurrent nausea and vomiting after NG tube removal.  He is scheduled for PEG feeding tube for venting and decompression tomorrow by IR.  He has intermittent abdominal pain, likely related to his small bowel obstruction.  He is tolerating TPN well, afebrile, no other new complaints.  Objective:  Vitals:   09/10/21 1516 09/10/21 2003  BP: 129/85 140/89  Pulse: 87 100  Resp: 19 20  Temp: 98 F (36.7 C) 98.7 F (37.1 C)  SpO2: 96% 95%    Body mass index is 36.07 kg/m.  Intake/Output Summary (Last 24 hours) at 09/10/2021 2032 Last data filed at 09/10/2021 0700 Gross per 24 hour  Intake 2335.95 ml  Output --  Net 2335.95 ml     Sclerae unicteric  Oropharynx clear  No peripheral adenopathy  Lungs clear -- no rales or rhonchi  Heart regular rate and rhythm  Abdomen soft, mild diffuse tenderness   MSK no focal spinal tenderness, no peripheral edema  Neuro nonfocal    CBG (last 3)  Recent Labs    09/10/21 0603 09/10/21 1622 09/10/21 2003  GLUCAP 124* 83 151*     Labs:  Urine Studies No results for input(s): UHGB, CRYS in the last 72 hours.  Invalid input(s): UACOL, UAPR, USPG, UPH, UTP, UGL, UKET, UBIL, UNIT, UROB, [place], UEPI, UWBC, Duwayne Heck [place], [place]  Basic Metabolic Panel: Recent Labs  Lab 09/06/21 0534 09/07/21 0609 09/08/21 0514 09/09/21 0503 09/10/21 0511  NA 137 136 133* 133* 136  K 4.1 4.6 4.7 4.5 4.6  CL 103 106 104 103 106  CO2 24 23 21* 24 23  GLUCOSE 118* 114* 99 113* 136*  BUN 14 18 18  21* 23*  CREATININE 1.03 1.02 0.90 1.01 0.96  CALCIUM 8.9 8.3* 8.2* 8.2* 8.4*  MG 2.1 2.1 2.1 2.1 2.3  PHOS 3.4 3.9 3.5 3.1 3.0   GFR Estimated Creatinine Clearance: 111.9 mL/min (by C-G formula based on SCr of 0.96 mg/dL). Liver Function Tests: Recent Labs  Lab 09/06/21 0534 09/07/21 0609 09/08/21 0514 09/09/21 0503 09/10/21 0511   AST 18 26 27 23 18   ALT 28 33 36 32 26  ALKPHOS 92 99 105 117 108  BILITOT 0.6 0.6 0.5 0.5 0.6  PROT 7.4 7.1 6.9 7.2 6.9  ALBUMIN 3.2* 3.0* 3.0* 3.0* 2.8*   No results for input(s): LIPASE, AMYLASE in the last 168 hours.  No results for input(s): AMMONIA in the last 168 hours. Coagulation profile Recent Labs  Lab 09/10/21 0511  INR 1.2    CBC: Recent Labs  Lab 09/06/21 0534 09/07/21 0609 09/08/21 0514 09/09/21 0503 09/10/21 0511  WBC 16.4* 15.6* 14.9* 13.3* 13.7*  NEUTROABS 14.1* 13.1* 12.1* 10.6* 11.3*  HGB 13.4 12.9* 12.6* 12.5* 12.0*  HCT 40.1 38.4* 37.9* 38.1* 36.3*  MCV 89.3 89.1 90.0 90.5 88.8  PLT 230 246 243 260 285   Cardiac Enzymes: No results for input(s): CKTOTAL, CKMB, CKMBINDEX, TROPONINI in the last 168 hours. BNP: Invalid input(s): POCBNP CBG: Recent Labs  Lab 09/09/21 2301 09/10/21 0324 09/10/21 0603 09/10/21 1622 09/10/21 2003  GLUCAP 137* 117* 124* 83 151*   D-Dimer No results for input(s): DDIMER in the last 72 hours. Hgb A1c No results for input(s): HGBA1C in the last 72 hours. Lipid Profile Recent Labs    09/08/21 0514 09/10/21 0511  TRIG 70 54   Thyroid function studies  No results for input(s): TSH, T4TOTAL, T3FREE, THYROIDAB in the last 72 hours.  Invalid input(s): FREET3 Anemia work up No results for input(s): VITAMINB12, FOLATE, FERRITIN, TIBC, IRON, RETICCTPCT in the last 72 hours. Microbiology Recent Results (from the past 240 hour(s))  Resp Panel by RT-PCR (Flu A&B, Covid) Nasopharyngeal Swab     Status: None   Collection Time: 09/02/21 10:16 PM   Specimen: Nasopharyngeal Swab; Nasopharyngeal(NP) swabs in vial transport medium  Result Value Ref Range Status   SARS Coronavirus 2 by RT PCR NEGATIVE NEGATIVE Final    Comment: (NOTE) SARS-CoV-2 target nucleic acids are NOT DETECTED.  The SARS-CoV-2 RNA is generally detectable in upper respiratory specimens during the acute phase of infection. The  lowest concentration of SARS-CoV-2 viral copies this assay can detect is 138 copies/mL. A negative result does not preclude SARS-Cov-2 infection and should not be used as the sole basis for treatment or other patient management decisions. A negative result may occur with  improper specimen collection/handling, submission of specimen other than nasopharyngeal swab, presence of viral mutation(s) within the areas targeted by this assay, and inadequate number of viral copies(<138 copies/mL). A negative result must be combined with clinical observations, patient history, and epidemiological information. The expected result is Negative.  Fact Sheet for Patients:  EntrepreneurPulse.com.au  Fact Sheet for Healthcare Providers:  IncredibleEmployment.be  This test is no t yet approved or cleared by the [place] FDA and  has been authorized for detection and/or diagnosis of SARS-CoV-2 by FDA under an Emergency Use Authorization (EUA). This EUA will remain  in effect (meaning this test can be used) for the duration of the COVID-19 declaration under Section 564(b)(1) of the Act, 21 U.S.C.section 360bbb-3(b)(1), unless the authorization is terminated  or revoked sooner.       Influenza A by PCR NEGATIVE NEGATIVE Final   Influenza B by PCR NEGATIVE NEGATIVE Final    Comment: (NOTE) The Xpert Xpress SARS-CoV-2/FLU/RSV plus assay is intended as an aid in the diagnosis of influenza from Nasopharyngeal swab specimens and should not be used as a sole basis for treatment. Nasal washings and aspirates are unacceptable for Xpert Xpress SARS-CoV-2/FLU/RSV testing.  Fact Sheet for Patients: EntrepreneurPulse.com.au  Fact Sheet for Healthcare Providers: IncredibleEmployment.be  This test is not yet approved or cleared by the [place] FDA and has been authorized for detection and/or diagnosis of SARS-CoV-2 by FDA under  an Emergency Use Authorization (EUA). This EUA will remain in effect (meaning this test can be used) for the duration of the COVID-19 declaration under Section 564(b)(1) of the Act, 21 U.S.C. section 360bbb-3(b)(1), unless the authorization is terminated or revoked.  Performed at [facility], [place] [address] [place], [place] [address]       Studies:  No results found.  Assessment: 55 y.o. 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  We will continue TPN after hospital discharge.  -I am OK for him to have clear liquid on discharge for pleasure only. No food.  -His recurrent small bowel obstruction is related to cancer progression, I discussed restarting oxaliplatin, which he previously had good response but stopped due to allergy reaction.  We will increase premedication.  He is scheduled for chemo on January 5.  If he is overall in reasonable shape next week after hospital discharge, I will offer chemo. He is not ready for hospice. Although his overall prognosis is very poor.  -I will f/u as needed before his discharge. I recommend prescription for oral dilaudid on discharge (or I can prescribe if needed) for severe pain at home as needed.    [name], MD 09/10/2021

## 2021-09-10 NOTE — Progress Notes (Signed)
PHARMACY - TOTAL PARENTERAL NUTRITION CONSULT NOTE   Indication:  Prolonged malnutrition due to  obstruction  Patient Measurements: Height: 5\' 11"  (967.8 cm) Weight: 117.3 kg (258 lb 9.6 oz) IBW/kg (Calculated) : 75.3 TPN AdjBW (KG): 77.3 Body mass index is 36.07 kg/m.  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 Oncology recommending TPN due to unlikely to tolerate po nutrition and likely need for venting G-tube.   Glucose / Insulin: no h/o DM, goal CBGs < 150 - CBG range 117-137 while TPN on, 81 while TPN off - 3 units sensitive SSI required Electrolytes: Na now WNL with increased concentration in TPN, K at upper end of normal (4.6) despite decreased concentration yesterday Renal: SCr WNL, BUN slightly elevated (23) Hepatic: LFTs, T bili, T Triglycerides: WNL Intake / Output: incomplete - UOP 3 occurences, net I/O +231ml MIVF: LR @ 10 ml/hr while TPN off GI Imaging:  - CTAP 09/02/21 findings consistent with small-bowel obstruction, free fluid rt abdomen, small l pleural effusion, stable metastatic disease - 12/26 KUB: continued SBO pattern GI Surgeries / Procedures:  5/20 lap appy 3/22 ex lap  w/ partial bowel resection and ostomy creation 8/22 diagnostic laprascopy w/ peritoneal biopsies 12/28 Plan venting G-tube per IR  Central access:  implanted port 12/12/20 TPN start date: 09/04/21  Nutritional Goals: Goal TPN 2353ml/24hr (provides 125 g of protein and 2234 kcals per day)  RD Assessment:  Estimated Needs Total Energy Estimated Needs: 2200-2400 Total Protein Estimated Needs: 115-125g Total Fluid Estimated Needs: 2,2L/day  Current Nutrition:  Clear liquids from 12/22 - CCS recommending not advance diet further, anticipate home on TPN; back to NPO 12/25  Plan:  At 1800: Continue TPN cyclic rate over 12 hr, from 6pm to 6am Electrolytes in TPN:   Na 177mEq/L, K 2mEq/L (decrease),  Ca 62mEq/L,  Mg 78mEq/L,  Phos 78mmol/L.  Cl:Ac 1:1 Add standard MVI and trace elements to TPN Sensitive SSI at 8p, 12a, 7a, 4p MIVF at [facility] - infuse only when TPN off Monitor TPN labs on Mon/Thurs  Peggyann Juba, PharmD, BCPS Pharmacy: [phone] 09/10/2021,8:17 AM

## 2021-09-10 NOTE — Progress Notes (Signed)
Palliative Medicine Inpatient Follow Up Note     Chart Reviewed. Patient assessed at the bedside.   Ricardo [name] is awake and alert. Appears comfortable. No acute distress. No family at the bedside. Continues to have nausea, vomiting, and abdominal pain. He is hopeful the venting G-tube will be placed tomorrow and provide him with some relief.   Patient states he requested I come and follow-up to discuss goals and assist with completion of his advanced directives. Ricardo [name] is remaining hopeful for some stability with the opportunity to get out of the hospital and continue to pursue any and all recommended treatments per his Oncology team. He is aware of the extensiveness of his cancer however would like an opportunity to live as long as possible given his quality of life is sufficient. Emotional support provided.   He shares he does not know what the future holds for him. He has a strong [place] faith and is relying on this to bring him peace and comfort.   We discussed his previously requested partial code status. He wishes to have all aggressive measures with limitations of intubation. If he reaches this point he wishes to be allowed a natural death. He is requesting assistance with completing his advanced directives while hospitalized. Advanced directive packet provided with extensive education. He confirms understanding and has completed all areas outside of the required signatures. He understands I will place referral for Spiritual Support to come assist with finalizing. He named his wife, Ricardo [name] as his primary medical decision maker with secondary being his brother, Ricardo [name]. He initialed all areas stating he would not want his life to be prolonged  by life-prolonging measures if he is unable to make or communicate health care decisions and has an incurable condition, became unconscious with no ability to regain, or suffers from advanced demential or other condition associated with  ability to think, he does not wish to be an organ donor, and has selected his living will be followed.   Mr. Rode verbalized appreciation of ongoing support.   Discussed the importance of continued conversation with family and their  medical providers regarding overall plan of care and treatment options, ensuring decisions are within the context of the patients values and GOCs.  Questions addressed and support provided.    Objective Assessment: Vital Signs Vitals:   09/09/21 2016 09/10/21 0323  BP: 126/90 120/80  Pulse: 97 100  Resp: 20 20  Temp: 97.9 F (36.6 C) 97.8 F (36.6 C)  SpO2: 97% 96%    Intake/Output Summary (Last 24 hours) at 09/10/2021 1411 Last data filed at 09/10/2021 0700 Gross per 24 hour  Intake 2335.95 ml  Output --  Net 2335.95 ml   Last Weight  Most recent update: 09/10/2021  1:05 AM    Weight  117.3 kg (258 lb 9.6 oz)            Gen:  NAD HEENT: moist mucous membranes CV: Regular rate and rhythm, no murmurs rubs or gallops PULM: clear to auscultation bilaterally. No wheezes/rales/rhonchi ABD: soft/nontender/nondistended/normal bowel sounds EXT: No edema Neuro: Alert and oriented x3  SUMMARY OF RECOMMENDATIONS   Continue with current plan of care per medical team  Hopeful for venting G-tube placement in the upcoming days Spiritual team consult to assist with advanced directives. Documents at the bedside completed and ready to finalized. (See above) Plans for continued treatments aggressively to allow every opportunity to thrive.   PMT will continue to support and follow on as  needed basis. Please secure chat for urgent needs.   Time Total: 40 min.   Visit consisted of counseling and education dealing with the complex and emotionally intense issues of symptom management and palliative care in the setting of serious and potentially life-threatening illness.Greater than 50%  of this time was spent counseling and coordinating care related to the  above assessment and plan.  Alda Lea, AGPCNP-BC  Palliative Medicine Team [phone]  Palliative Medicine Team providers are available by phone from 7am to 7pm daily and can be reached through the team cell phone. Should this patient require assistance outside of these hours, please call the patient's attending physician.

## 2021-09-10 NOTE — Progress Notes (Signed)
PROGRESS NOTE    Ricardo [name]   FYB:[id number]  DOB: 09-Apr-1966  DOA: 09/02/2021 PCP: [name], MD   Brief Narrative:  Ricardo [name] yo with hx metastatic colon cancer s/p partial bowel resection in March 2022, GERD, GAD, SBO who presented to [facility] with recurrent SBO.   CT abdomen pelvis with contrast 12/20>  1. Again seen are findings of small-bowel obstruction. Transition point is likely in the mid abdomen where there are clustered small bowel loops adhered to the anterior abdominal wall. Can not exclude underlying obstructive lesion. Degree of distention has increased when compared to the prior exam. There is now air-fluid level in the stomach. 2. Increasing free fluid in the right abdomen. 3. New small left pleural effusion. 4. Stable diffuse omental haziness is unchanged, concerning for metastatic disease.  The patient initially had an NG tube placed which was clamped and later removed.  However he developed obstruction once again.  He is now awaiting a PEG tube for venting purposes.  He also has TPN running.  Dr. Burr Medico is his oncologist  Subjective: Continues to have abdominal pain nausea and vomiting.  Hesitant to have NG tube placed and is waiting on the PEG tube.    Assessment & Plan:   Principal Problem:   SBO (small bowel obstruction) (HCC) -Due to colon cancer with peritoneal metastasis - Not a candidate for HIPEC and not a surgical candidate -General surgery has signed off -Receives chemo per Dr. Burr Medico who plans to continue chemo -Palliative care evaluated the patient on 12/21-he requested not to be intubated but otherwise wanted to treat aggressively  Active Problems:   Obesity (BMI 30-39.9) Body mass index is 36.07 kg/m.  Small left pleural effusion -In setting of metastatic cancer, may be a malignant effusion  Time spent in minutes: 35 DVT prophylaxis: enoxaparin (LOVENOX) injection 40 mg Start: 09/05/21 1400 SCDs Start: 09/02/21  2150  Code Status: DO NOT INTUBATE Family Communication:  Level of Care: Level of care: Telemetry Disposition Plan:  Status is: Inpatient  Remains inpatient appropriate because: Awaiting venting gastrostomy  Consultants:  General surgery Palliative care Oncology Procedures:  NG tube Antimicrobials:  Anti-infectives (From admission, onward)    Start     Dose/Rate Route Frequency Ordered Stop   09/10/21 0000  ceFAZolin (ANCEF) IVPB 2g/100 mL premix        2 g 200 mL/hr over 30 Minutes Intravenous To Radiology 09/09/21 1605 09/11/21 0000        Objective: Vitals:   09/09/21 2016 09/10/21 0105 09/10/21 0323 09/10/21 1516  BP: 126/90  120/80 129/85  Pulse: 97  100 87  Resp: 20  20 19   Temp: 97.9 F (36.6 C)  97.8 F (36.6 C) 98 F (36.7 C)  TempSrc: Oral  Oral Oral  SpO2: 97%  96% 96%  Weight: 117.3 kg 117.3 kg    Height:        Intake/Output Summary (Last 24 hours) at 09/10/2021 1608 Last data filed at 09/10/2021 0700 Gross per 24 hour  Intake 2335.95 ml  Output --  Net 2335.95 ml   Filed Weights   09/09/21 0205 09/09/21 2016 09/10/21 0105  Weight: 113.8 kg 117.3 kg 117.3 kg    Examination: General exam: Appears comfortable  HEENT: PERRLA, oral mucosa moist, no sclera icterus or thrush Respiratory system: Clear to auscultation. Respiratory effort normal. Cardiovascular system: S1 & S2 heard, RRR.   Gastrointestinal system: Abdomen soft, non-tender, nondistended. Normal bowel sounds. Central nervous system: Alert  and oriented. No focal neurological deficits. Extremities: No cyanosis, clubbing or edema Skin: No rashes or ulcers Psychiatry:  Mood & affect appropriate.     Data Reviewed: I have personally reviewed following labs and imaging studies  CBC: Recent Labs  Lab 09/06/21 0534 09/07/21 0609 09/08/21 0514 09/09/21 0503 09/10/21 0511  WBC 16.4* 15.6* 14.9* 13.3* 13.7*  NEUTROABS 14.1* 13.1* 12.1* 10.6* 11.3*  HGB 13.4 12.9* 12.6* 12.5*  12.0*  HCT 40.1 38.4* 37.9* 38.1* 36.3*  MCV 89.3 89.1 90.0 90.5 88.8  PLT 230 246 243 260 035   Basic Metabolic Panel: Recent Labs  Lab 09/06/21 0534 09/07/21 0609 09/08/21 0514 09/09/21 0503 09/10/21 0511  NA 137 136 133* 133* 136  K 4.1 4.6 4.7 4.5 4.6  CL 103 106 104 103 106  CO2 24 23 21* 24 23  GLUCOSE 118* 114* 99 113* 136*  BUN 14 18 18  21* 23*  CREATININE 1.03 1.02 0.90 1.01 0.96  CALCIUM 8.9 8.3* 8.2* 8.2* 8.4*  MG 2.1 2.1 2.1 2.1 2.3  PHOS 3.4 3.9 3.5 3.1 3.0   GFR: Estimated Creatinine Clearance: 111.9 mL/min (by C-G formula based on SCr of 0.96 mg/dL). Liver Function Tests: Recent Labs  Lab 09/06/21 0534 09/07/21 0609 09/08/21 0514 09/09/21 0503 09/10/21 0511  AST 18 26 27 23 18   ALT 28 33 36 32 26  ALKPHOS 92 99 105 117 108  BILITOT 0.6 0.6 0.5 0.5 0.6  PROT 7.4 7.1 6.9 7.2 6.9  ALBUMIN 3.2* 3.0* 3.0* 3.0* 2.8*   No results for input(s): LIPASE, AMYLASE in the last 168 hours. No results for input(s): AMMONIA in the last 168 hours. Coagulation Profile: Recent Labs  Lab 09/10/21 0511  INR 1.2   Cardiac Enzymes: No results for input(s): CKTOTAL, CKMB, CKMBINDEX, TROPONINI in the last 168 hours. BNP (last 3 results) No results for input(s): PROBNP in the last 8760 hours. HbA1C: No results for input(s): HGBA1C in the last 72 hours. CBG: Recent Labs  Lab 09/09/21 1731 09/09/21 2018 09/09/21 2301 09/10/21 0324 09/10/21 0603  GLUCAP 81 172* 137* 117* 124*   Lipid Profile: Recent Labs    09/08/21 0514 09/10/21 0511  TRIG 70 54   Thyroid Function Tests: No results for input(s): TSH, T4TOTAL, FREET4, T3FREE, THYROIDAB in the last 72 hours. Anemia Panel: No results for input(s): VITAMINB12, FOLATE, FERRITIN, TIBC, IRON, RETICCTPCT in the last 72 hours. Urine analysis:    Component Value Date/Time   COLORURINE YELLOW 09/03/2021 0800   APPEARANCEUR CLEAR 09/03/2021 0800   LABSPEC >1.046 (H) 09/03/2021 0800   PHURINE 5.0 09/03/2021 0800    GLUCOSEU NEGATIVE 09/03/2021 0800   HGBUR NEGATIVE 09/03/2021 0800   BILIRUBINUR NEGATIVE 09/03/2021 0800   KETONESUR NEGATIVE 09/03/2021 0800   PROTEINUR NEGATIVE 09/03/2021 0800   NITRITE NEGATIVE 09/03/2021 0800   LEUKOCYTESUR NEGATIVE 09/03/2021 0800      Radiology Studies: No results found.    Scheduled Meds:  Chlorhexidine Gluconate Cloth  6 each Topical Daily   enoxaparin (LOVENOX) injection  40 mg Subcutaneous Daily   insulin aspart  0-9 Units Subcutaneous 4 times per day on Sun Mon   insulin aspart  0-9 Units Subcutaneous 4 times per day   pantoprazole (PROTONIX) IV  40 mg Intravenous Q24H   Continuous Infusions:   ceFAZolin (ANCEF) IV     lactated ringers Stopped (00/93/81 8299)   TPN CYCLIC-ADULT (ION)       LOS: 8 days      Debbe Odea, MD  [facility] Pager: [email or web address] 09/10/2021, 4:08 PM

## 2021-09-10 NOTE — Progress Notes (Signed)
Spoke to on call provider in regards to patient having multiple bouts of emesis overnight. Patient reported throwing up 2L of bile. RN gave Zofran with some effect. Requested additional antiemetic. Compazine ordered and administered. Patient still declines NGT placement due to vented PEG placement.   Patient also c/o increasing pain. RN gave patient Dilaudid 1mg  and while patient's pain decreased, still severe and unrelenting. Provider gave additional one time dose of Dilaudid 0.5mg .

## 2021-09-10 NOTE — Progress Notes (Signed)
Chaplain received a consult that [name] wanted to notarize his HCPOA paperwork.  The notary was not available right away, but chaplain explained that his wife would already be considered the next of kin even if the paperwork was not notarized.  Plan to return tomorrow to assist in getting paperwork notarized.  Chaplain also provided prayer at Ricardo [name]'s request.  Chaplain Janne Napoleon, Bcc Pager, [phone] 4:40 PM

## 2021-09-11 ENCOUNTER — Inpatient Hospital Stay (HOSPITAL_COMMUNITY): Payer: BC Managed Care – PPO

## 2021-09-11 DIAGNOSIS — K56609 Unspecified intestinal obstruction, unspecified as to partial versus complete obstruction: Secondary | ICD-10-CM | POA: Diagnosis not present

## 2021-09-11 HISTORY — PX: IR GASTROSTOMY TUBE MOD SED: IMG625

## 2021-09-11 LAB — COMPREHENSIVE METABOLIC PANEL
ALT: 23 U/L (ref 0–44)
AST: 16 U/L (ref 15–41)
Albumin: 2.8 g/dL — ABNORMAL LOW (ref 3.5–5.0)
Alkaline Phosphatase: 107 U/L (ref 38–126)
Anion gap: 7 (ref 5–15)
BUN: 22 mg/dL — ABNORMAL HIGH (ref 6–20)
CO2: 24 mmol/L (ref 22–32)
Calcium: 8.2 mg/dL — ABNORMAL LOW (ref 8.9–10.3)
Chloride: 105 mmol/L (ref 98–111)
Creatinine, Ser: 1.02 mg/dL (ref 0.61–1.24)
GFR, Estimated: >60 mL/min (ref >60)
Glucose, Bld: 97 mg/dL (ref 70–99)
Potassium: 4.3 mmol/L (ref 3.5–5.1)
Sodium: 136 mmol/L (ref 135–145)
Total Bilirubin: 0.4 mg/dL (ref 0.3–1.2)
Total Protein: 7 g/dL (ref 6.5–8.1)

## 2021-09-11 LAB — GLUCOSE, CAPILLARY
Glucose-Capillary: 106 mg/dL — ABNORMAL HIGH (ref 70–99)
Glucose-Capillary: 102 mg/dL — ABNORMAL HIGH (ref 70–99)
Glucose-Capillary: 73 mg/dL (ref 70–99)
Glucose-Capillary: 125 mg/dL — ABNORMAL HIGH (ref 70–99)

## 2021-09-11 LAB — PHOSPHORUS: Phosphorus: 3.5 mg/dL (ref 2.5–4.6)

## 2021-09-11 LAB — MAGNESIUM: Magnesium: 2.2 mg/dL (ref 1.7–2.4)

## 2021-09-11 MED ORDER — MIDAZOLAM HCL 2 MG/2ML IJ SOLN
INTRAMUSCULAR | Status: AC | PRN
  Administered 2021-09-11: 1 mg via INTRAVENOUS
  Administered 2021-09-11: 2 mg via INTRAVENOUS
  Administered 2021-09-11: 1 mg via INTRAVENOUS

## 2021-09-11 MED ORDER — LIDOCAINE HCL 1 % IJ SOLN
INTRAMUSCULAR | Status: AC
  Filled 2021-09-11: qty 20

## 2021-09-11 MED ORDER — LIDOCAINE VISCOUS HCL 2 % MT SOLN
OROMUCOSAL | Status: AC
  Filled 2021-09-11: qty 15

## 2021-09-11 MED ORDER — CEFAZOLIN SODIUM-DEXTROSE 2-4 GM/100ML-% IV SOLN
INTRAVENOUS | Status: AC
  Filled 2021-09-11: qty 100

## 2021-09-11 MED ORDER — IOHEXOL 300 MG/ML  SOLN
50.0000 mL | Freq: Once | INTRAMUSCULAR | Status: AC | PRN
  Administered 2021-09-11: 16:00:00 15 mL

## 2021-09-11 MED ORDER — FENTANYL CITRATE (PF) 100 MCG/2ML IJ SOLN
INTRAMUSCULAR | Status: AC
  Filled 2021-09-11: qty 2

## 2021-09-11 MED ORDER — TRAVASOL 10 % IV SOLN
INTRAVENOUS | Status: AC
  Filled 2021-09-11: qty 1254

## 2021-09-11 MED ORDER — FENTANYL CITRATE (PF) 100 MCG/2ML IJ SOLN
INTRAMUSCULAR | Status: AC | PRN
  Administered 2021-09-11 (×2): 50 ug via INTRAVENOUS

## 2021-09-11 MED ORDER — CEFAZOLIN SODIUM-DEXTROSE 2-4 GM/100ML-% IV SOLN
2.0000 g | INTRAVENOUS | Status: DC
  Filled 2021-09-11: qty 100

## 2021-09-11 MED ORDER — GLUCAGON HCL RDNA (DIAGNOSTIC) 1 MG IJ SOLR
INTRAMUSCULAR | Status: AC
  Filled 2021-09-11: qty 1

## 2021-09-11 MED ORDER — GLUCAGON HCL (RDNA) 1 MG IJ SOLR
INTRAMUSCULAR | Status: AC | PRN
  Administered 2021-09-11: .5 mL via INTRAVENOUS

## 2021-09-11 MED ORDER — MIDAZOLAM HCL 2 MG/2ML IJ SOLN
INTRAMUSCULAR | Status: AC
  Filled 2021-09-11: qty 4

## 2021-09-11 NOTE — Progress Notes (Signed)
Nutrition Follow-up  DOCUMENTATION CODES:   Obesity unspecified  INTERVENTION:   -Cyclic TPN management per Pharmacy   NUTRITION DIAGNOSIS:   Increased nutrient needs related to chronic illness, cancer and cancer related treatments as evidenced by estimated needs.  Ongoing.  GOAL:   Patient will meet greater than or equal to 90% of their needs  Meeting with TPN  MONITOR:   Diet advancement, Labs, Weight trends, I & O's (TPN)  ASSESSMENT:   55 y.o. male with medical history significant for colon cancer with metastasis to peritoneum status post partial bowel resection in March 2022, GERD, generalized anxiety disorder, SBO, who is admitted to [facility] on 09/02/2021 with recurrence of small bowel obstruction after presenting from home to [facility] ED complaining of shortness of breath.  Patient now transitioned to cyclic TPN with plans to go home on clear liquids per oncology.  Pt having venting G-tube placed today.  Cyclic TPN continues x 12 hours providing 2234 kcals and 125g protein.  Admission weight: 260 lbs. Current weight: 258 lbs  Medications: Zofran  Labs reviewed:  CBGs: 102-151  Diet Order:   Diet Order             Diet NPO time specified Except for: Ice Chips, Sips with Meds  Diet effective midnight                   EDUCATION NEEDS:   Education needs have been addressed  Skin:  Skin Assessment: Reviewed RN Assessment  Last BM:  12/26  Height:   Ht Readings from Last 1 Encounters:  09/03/21 5\' 11"  (1.803 m)    Weight:   Wt Readings from Last 1 Encounters:  09/10/21 117.3 kg    BMI:  Body mass index is 36.07 kg/m.  Estimated Nutritional Needs:   Kcal:  2200-2400  Protein:  115-125g  Fluid:  2,2L/day  Clayton Bibles, MS, RD, LDN Inpatient Clinical Dietitian Contact information available via Amion

## 2021-09-11 NOTE — Progress Notes (Signed)
PHARMACY - TOTAL PARENTERAL NUTRITION CONSULT NOTE   Indication:  Prolonged malnutrition due to  obstruction  Patient Measurements: Height: 5\' 11"  (180.3 cm) Weight: 117.3 kg (258 lb 9.6 oz) IBW/kg (Calculated) : 75.3 TPN AdjBW (KG): 77.3 Body mass index is 36.07 kg/m.  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 Oncology recommending TPN due to unlikely to tolerate po nutrition and likely need for venting G-tube.   Glucose / Insulin: no h/o DM, goal CBGs < 150 - CBG range 106-150 while TPN on, 83 while TPN off - 3 units sensitive SSI required Electrolytes: Na now WNL with increased concentration in TPN, K is 4.3  after a decrease in concentration yesterday. CorrCa is WNL Renal: SCr WNL, BUN slightly elevated (22) Hepatic: LFTs, T bili, are WNL Triglycerides: WNL Intake / Output: incomplete - UOP 3 occurences, net I/O +2164ml MIVF: LR @ 10 ml/hr while TPN off GI Imaging:  - CTAP 09/02/21 findings consistent with small-bowel obstruction, free fluid rt abdomen, small l pleural effusion, stable metastatic disease - 12/26 KUB: continued SBO pattern GI Surgeries / Procedures:  5/20 lap appy 3/22 ex lap  w/ partial bowel resection and ostomy creation 8/22 diagnostic laprascopy w/ peritoneal biopsies 12/28 Plan venting G-tube per IR  Central access:  implanted port 12/12/20 TPN start date: 09/04/21  Nutritional Goals: Goal TPN 2323ml/24hr (provides 125 g of protein and 2234 kcals per day)  RD Assessment:  Estimated Needs Total Energy Estimated Needs: 2200-2400 Total Protein Estimated Needs: 115-125g Total Fluid Estimated Needs: 2,2L/day  Current Nutrition:  Clear liquids from 12/22 - CCS recommending not advance diet further, anticipate home on TPN; back to NPO 12/25  Plan:  At 1800: Continue TPN cyclic rate over 12 hr, from 6pm to 6am Electrolytes in  TPN:  Na 130mEq/L, K 20mEq/L,  Ca 54mEq/L,  Mg 34mEq/L,  Phos 10mmol/L.  Cl:Ac 1:1 Add standard MVI and trace elements to TPN Sensitive SSI at 8p, 12a, 7a, 4p MIVF at [facility] - infuse only when TPN off Monitor TPN labs on Mon/Thurs BMP, magnesium, phosphorus with AM labs      Royetta Asal, PharmD, BCPS 09/11/2021 10:55 AM

## 2021-09-11 NOTE — Procedures (Signed)
Interventional Radiology Procedure Note  Procedure: Percutaneous gastrostomy  Complications: None  Estimated Blood Loss: < 10 mL  Findings: 20 Fr bumper retention gastrostomy tube placed in body of stomach primarily for venting purposes.  Venetia Night. Kathlene Cote, M.D Pager:  [phone]

## 2021-09-11 NOTE — Progress Notes (Signed)
PROGRESS NOTE    Ricardo [name]   YWV:[id number]  DOB: 02/23/1966  DOA: 09/02/2021 PCP: [name], MD   Brief Narrative:  Ricardo [name] yo with hx metastatic colon cancer s/p partial bowel resection in March 2022, GERD, GAD, SBO who presented to [facility] with recurrent SBO.   CT abdomen pelvis with contrast 12/20>  1. Again seen are findings of small-bowel obstruction. Transition point is likely in the mid abdomen where there are clustered small bowel loops adhered to the anterior abdominal wall. Can not exclude underlying obstructive lesion. Degree of distention has increased when compared to the prior exam. There is now air-fluid level in the stomach. 2. Increasing free fluid in the right abdomen. 3. New small left pleural effusion. 4. Stable diffuse omental haziness is unchanged, concerning for metastatic disease.  The patient initially had an NG tube placed which was clamped and later removed.  However he developed obstruction once again.  He is now awaiting a PEG tube for venting purposes.  He also has TPN running.  Dr. Burr Medico is his oncologist  Subjective: No new complaints. Has ongoing abdominal pain.     Assessment & Plan:   Principal Problem:   SBO (small bowel obstruction) (HCC) -Due to colon cancer with peritoneal metastasis - Not a candidate for HIPEC and not a surgical candidate -General surgery has signed off -Receives chemo per Dr. Burr Medico who plans to continue chemo -Palliative care evaluated the patient on 12/21-he requested not to be intubated but otherwise wanted to treat aggressively - awaiting venting PEG tube by IR  Active Problems:   Obesity (BMI 30-39.9) Body mass index is 36.07 kg/m.  Small left pleural effusion -In setting of metastatic cancer, may be a malignant effusion  Time spent in minutes: 35 DVT prophylaxis: enoxaparin (LOVENOX) injection 40 mg Start: 09/05/21 1400 SCDs Start: 09/02/21 2150  Code Status: DO NOT  INTUBATE Family Communication:  Level of Care: Level of care: Telemetry Disposition Plan:  Status is: Inpatient  Remains inpatient appropriate because: Awaiting venting gastrostomy  Consultants:  General surgery Palliative care Oncology Procedures:  NG tube Antimicrobials:  Anti-infectives (From admission, onward)    Start     Dose/Rate Route Frequency Ordered Stop   09/10/21 0000  ceFAZolin (ANCEF) IVPB 2g/100 mL premix        2 g 200 mL/hr over 30 Minutes Intravenous To Radiology 09/09/21 1605 09/11/21 0000        Objective: Vitals:   09/10/21 0323 09/10/21 1516 09/10/21 2003 09/11/21 0513  BP: 120/80 129/85 140/89 132/88  Pulse: 100 87 100 (!) 102  Resp: 20 19 20 19   Temp: 97.8 F (36.6 C) 98 F (36.7 C) 98.7 F (37.1 C) (!) 97.4 F (36.3 C)  TempSrc: Oral Oral  Oral  SpO2: 96% 96% 95% 95%  Weight:      Height:        Intake/Output Summary (Last 24 hours) at 09/11/2021 1430 Last data filed at 09/11/2021 0500 Gross per 24 hour  Intake 2114.6 ml  Output --  Net 2114.6 ml    Filed Weights   09/09/21 0205 09/09/21 2016 09/10/21 0105  Weight: 113.8 kg 117.3 kg 117.3 kg    Examination: General exam: Appears comfortable  HEENT: PERRLA, oral mucosa moist, no sclera icterus or thrush Respiratory system: Clear to auscultation. Respiratory effort normal. Cardiovascular system: S1 & S2 heard, regular rate and rhythm Gastrointestinal system: Abdomen soft, mid abdominal tenderness, nondistended. Normal bowel sounds   Central nervous  system: Alert and oriented. No focal neurological deficits. Extremities: No cyanosis, clubbing or edema Skin: No rashes or ulcers Psychiatry:  Mood & affect appropriate.       Data Reviewed: I have personally reviewed following labs and imaging studies  CBC: Recent Labs  Lab 09/06/21 0534 09/07/21 0609 09/08/21 0514 09/09/21 0503 09/10/21 0511  WBC 16.4* 15.6* 14.9* 13.3* 13.7*  NEUTROABS 14.1* 13.1* 12.1* 10.6* 11.3*   HGB 13.4 12.9* 12.6* 12.5* 12.0*  HCT 40.1 38.4* 37.9* 38.1* 36.3*  MCV 89.3 89.1 90.0 90.5 88.8  PLT 230 246 243 260 580    Basic Metabolic Panel: Recent Labs  Lab 09/07/21 0609 09/08/21 0514 09/09/21 0503 09/10/21 0511 09/11/21 0642  NA 136 133* 133* 136 136  K 4.6 4.7 4.5 4.6 4.3  CL 106 104 103 106 105  CO2 23 21* 24 23 24   GLUCOSE 114* 99 113* 136* 97  BUN 18 18 21* 23* 22*  CREATININE 1.02 0.90 1.01 0.96 1.02  CALCIUM 8.3* 8.2* 8.2* 8.4* 8.2*  MG 2.1 2.1 2.1 2.3 2.2  PHOS 3.9 3.5 3.1 3.0 3.5    GFR: Estimated Creatinine Clearance: 105.3 mL/min (by C-G formula based on SCr of 1.02 mg/dL). Liver Function Tests: Recent Labs  Lab 09/07/21 0609 09/08/21 0514 09/09/21 0503 09/10/21 0511 09/11/21 0642  AST 26 27 23 18 16   ALT 33 36 32 26 23  ALKPHOS 99 105 117 108 107  BILITOT 0.6 0.5 0.5 0.6 0.4  PROT 7.1 6.9 7.2 6.9 7.0  ALBUMIN 3.0* 3.0* 3.0* 2.8* 2.8*    No results for input(s): LIPASE, AMYLASE in the last 168 hours. No results for input(s): AMMONIA in the last 168 hours. Coagulation Profile: Recent Labs  Lab 09/10/21 0511  INR 1.2    Cardiac Enzymes: No results for input(s): CKTOTAL, CKMB, CKMBINDEX, TROPONINI in the last 168 hours. BNP (last 3 results) No results for input(s): PROBNP in the last 8760 hours. HbA1C: No results for input(s): HGBA1C in the last 72 hours. CBG: Recent Labs  Lab 09/10/21 1622 09/10/21 2003 09/10/21 2345 09/11/21 0812 09/11/21 1150  GLUCAP 83 151* 150* 106* 102*    Lipid Profile: Recent Labs    09/10/21 0511  TRIG 54    Thyroid Function Tests: No results for input(s): TSH, T4TOTAL, FREET4, T3FREE, THYROIDAB in the last 72 hours. Anemia Panel: No results for input(s): VITAMINB12, FOLATE, FERRITIN, TIBC, IRON, RETICCTPCT in the last 72 hours. Urine analysis:    Component Value Date/Time   COLORURINE YELLOW 09/03/2021 0800   APPEARANCEUR CLEAR 09/03/2021 0800   LABSPEC >1.046 (H) 09/03/2021 0800    PHURINE 5.0 09/03/2021 0800   GLUCOSEU NEGATIVE 09/03/2021 0800   HGBUR NEGATIVE 09/03/2021 0800   BILIRUBINUR NEGATIVE 09/03/2021 0800   KETONESUR NEGATIVE 09/03/2021 0800   PROTEINUR NEGATIVE 09/03/2021 0800   NITRITE NEGATIVE 09/03/2021 0800   LEUKOCYTESUR NEGATIVE 09/03/2021 0800      Radiology Studies: No results found.    Scheduled Meds:  Chlorhexidine Gluconate Cloth  6 each Topical Daily   enoxaparin (LOVENOX) injection  40 mg Subcutaneous Daily   insulin aspart  0-9 Units Subcutaneous 4 times per day on Sun Mon   insulin aspart  0-9 Units Subcutaneous 4 times per day   pantoprazole (PROTONIX) IV  40 mg Intravenous Q24H   Continuous Infusions:  lactated ringers Stopped (99/83/38 2505)   TPN CYCLIC-ADULT (ION)       LOS: 9 days      Debbe Odea, MD [facility] Pager: [email or web address] 09/11/2021, 2:30 PM

## 2021-09-11 NOTE — Progress Notes (Signed)
Pt states he was told his TPN would be stopped at midnight since he is NPO at midnight for an upcoming procedure. No orders placed for TPN to stop at midnight. Called IV team to verify. Confirmed that TPN does not need to be stopped due to pt being NPO at midnight for procedure. Pt made aware that there is an order for rate decrease from 207 to 104 at 0458 x 1 hr and then stop. Pt verbalizes understanding of how his TPN is ordered.

## 2021-09-12 DIAGNOSIS — C189 Malignant neoplasm of colon, unspecified: Secondary | ICD-10-CM | POA: Diagnosis not present

## 2021-09-12 DIAGNOSIS — C772 Secondary and unspecified malignant neoplasm of intra-abdominal lymph nodes: Secondary | ICD-10-CM | POA: Diagnosis not present

## 2021-09-12 DIAGNOSIS — K56609 Unspecified intestinal obstruction, unspecified as to partial versus complete obstruction: Secondary | ICD-10-CM | POA: Diagnosis not present

## 2021-09-12 LAB — GLUCOSE, CAPILLARY
Glucose-Capillary: 102 mg/dL — ABNORMAL HIGH (ref 70–99)
Glucose-Capillary: 137 mg/dL — ABNORMAL HIGH (ref 70–99)
Glucose-Capillary: 143 mg/dL — ABNORMAL HIGH (ref 70–99)
Glucose-Capillary: 150 mg/dL — ABNORMAL HIGH (ref 70–99)
Glucose-Capillary: 163 mg/dL — ABNORMAL HIGH (ref 70–99)
Glucose-Capillary: 98 mg/dL (ref 70–99)

## 2021-09-12 LAB — BASIC METABOLIC PANEL
Anion gap: 7 (ref 5–15)
BUN: 20 mg/dL (ref 6–20)
CO2: 22 mmol/L (ref 22–32)
Calcium: 8 mg/dL — ABNORMAL LOW (ref 8.9–10.3)
Chloride: 105 mmol/L (ref 98–111)
Creatinine, Ser: 0.89 mg/dL (ref 0.61–1.24)
GFR, Estimated: >60 mL/min (ref >60)
Glucose, Bld: 133 mg/dL — ABNORMAL HIGH (ref 70–99)
Potassium: 4.1 mmol/L (ref 3.5–5.1)
Sodium: 134 mmol/L — ABNORMAL LOW (ref 135–145)

## 2021-09-12 LAB — PHOSPHORUS: Phosphorus: 3.1 mg/dL (ref 2.5–4.6)

## 2021-09-12 LAB — MAGNESIUM: Magnesium: 2.1 mg/dL (ref 1.7–2.4)

## 2021-09-12 MED ORDER — HYDROMORPHONE HCL 1 MG/ML IJ SOLN
1.0000 mg | Freq: Once | INTRAMUSCULAR | Status: AC
  Administered 2021-09-12: 09:00:00 1 mg via INTRAVENOUS
  Filled 2021-09-12: qty 1

## 2021-09-12 MED ORDER — NALOXONE HCL 4 MG/0.1ML NA LIQD: NASAL | 1 refills | Status: DC

## 2021-09-12 MED ORDER — TRAVASOL 10 % IV SOLN
INTRAVENOUS | Status: AC
  Filled 2021-09-12: qty 1254

## 2021-09-12 MED ORDER — HYDROMORPHONE HCL 2 MG PO TABS: 1.0000 mg | ORAL_TABLET | ORAL | 0 refills | Status: DC | PRN

## 2021-09-12 NOTE — TOC Transition Note (Addendum)
Transition of Care [facility]) - CM/SW Discharge Note   Patient Details  Name: KIMSEY DEMAREE MRN: [id number] Date of Birth: 02/06/1966  Transition of Care [facility]) CM/SW Contact:  Leeroy Cha, RN Phone Number: 09/12/2021, 10:45 AM   Clinical Narrative:    Allayne Stack dcd to home with iv tpn care through [facility].  Carolynn Sayers notified at 1045.  [facility] for hhc needs. Rn care can not start until Saturday [id number]/will hold patient until then due to tpn and meds.  Final next level of care: Home w Home Health Services Barriers to Discharge: Barriers Resolved   Patient Goals and CMS Choice     Choice offered to / list presented to : Spouse  Discharge Placement                       Discharge Plan and Services In-house Referral: Clinical Social Work Discharge Planning Services: CM Consult Post Acute Care Choice: Home Health          DME Arranged: N/A DME Agency: NA       HH Arranged: RN, TPN HH Agency: [facility] Date HH Agency Contacted: 09/12/21 Time [place]: [phone] Representative spoke with at [place]: [place] ([place]) Interventions     Readmission Risk Interventions Readmission Risk Prevention Plan 09/06/2021 08/28/2021  Transportation Screening - Complete  HRI or Home Care Consult Complete Complete  Social Work Consult for [place] Planning/Counseling Complete Complete  Palliative Care Screening Not Applicable Not Applicable  Some recent data might be hidden

## 2021-09-12 NOTE — Progress Notes (Signed)
PHARMACY - TOTAL PARENTERAL NUTRITION CONSULT NOTE   Indication:  Prolonged malnutrition due to  obstruction  Patient Measurements: Height: 5\' 11"  (180.3 cm) Weight: 119.1 kg (262 lb 9.1 oz) IBW/kg (Calculated) : 75.3 TPN AdjBW (KG): 77.3 Body mass index is 36.62 kg/m.  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 Oncology recommending TPN due to unlikely to tolerate po nutrition and likely need for venting G-tube.   Glucose / Insulin: no h/o DM, goal CBGs < 150 - CBG range 102-150 while TPN on, 73 while TPN off - 2 units sensitive SSI required Electrolytes: Na slightly low at 134,  CorrCa is WNL. All others WNL  Renal: SCr WNL, BUN WNL Hepatic: LFTs, T bili, are WNL Triglycerides: WNL Intake / Output: incomplete - UOP 3 occurences, net I/O +2385ml MIVF: LR @ 10 ml/hr while TPN off GI Imaging:  - CTAP 09/02/21 findings consistent with small-bowel obstruction, free fluid rt abdomen, small l pleural effusion, stable metastatic disease - 12/26 KUB: continued SBO pattern GI Surgeries / Procedures:  5/20 lap appy 3/22 ex lap  w/ partial bowel resection and ostomy creation 8/22 diagnostic laprascopy w/ peritoneal biopsies 12/28 Plan venting G-tube per IR  Central access:  implanted port 12/12/20 TPN start date: 09/04/21  Nutritional Goals: Goal TPN 23108ml/24hr (provides 125 g of protein and 2234 kcals per day)  RD Assessment:  Estimated Needs Total Energy Estimated Needs: 2200-2400 Total Protein Estimated Needs: 115-125g Total Fluid Estimated Needs: 2,2L/day  Current Nutrition:  Clear liquids from 12/22 - CCS recommending not advance diet further, anticipate home on TPN; back to NPO 12/25  Plan:  At 1800: Continue TPN cyclic rate over 12 hr, from 6pm to 6am Electrolytes in TPN:  Na inc to 172mEq/L, K 48mEq/L,  Ca 52mEq/L,  Mg 39mEq/L,  Phos 12mmol/L.   Cl:Ac 1:1 Add standard MVI and trace elements to TPN Sensitive SSI at 8p, 12a, 7a, 4p MIVF at [facility] - infuse only when TPN off Monitor TPN labs on Mon/Thurs BMP, magnesium, phosphorus with AM labs      Royetta Asal, PharmD, BCPS 09/12/2021 11:24 AM

## 2021-09-12 NOTE — Progress Notes (Signed)
PROGRESS NOTE    Ricardo [name]   WIO:[id number]  DOB: 11/28/1965  DOA: 09/02/2021 PCP: Ricardo Pollen, MD   Brief Narrative:  Ricardo [name] yo with hx metastatic colon cancer s/p partial bowel resection in March 2022, GERD, GAD, SBO who presented to [facility] with recurrent SBO.   CT abdomen pelvis with contrast 12/20>  1. Again seen are findings of small-bowel obstruction. Transition point is likely in the mid abdomen where there are clustered small bowel loops adhered to the anterior abdominal wall. Can not exclude underlying obstructive lesion. Degree of distention has increased when compared to the prior exam. There is now air-fluid level in the stomach. 2. Increasing free fluid in the right abdomen. 3. New small left pleural effusion. 4. Stable diffuse omental haziness is unchanged, concerning for metastatic disease.  The patient initially had an NG tube placed which was clamped and later removed.  However he developed obstruction once again.  He is now awaiting a PEG tube for venting purposes.  He also has TPN running.  Dr. Burr [name] is his oncologist  Subjective: Having pain at site of PEG tube    Assessment & Plan:   Principal Problem:   SBO (small bowel obstruction) ([place]) -Due to colon cancer with peritoneal metastasis - Not a candidate for HIPEC and not a surgical candidate -General surgery has signed off -Receives chemo per Dr. Burr [name] who plans to continue chemo -Palliative care evaluated the patient on 12/21-he requested not to be intubated but otherwise wanted to treat aggressively - venting PEG placed on 12/29- working well -  will be able to go home tomorrow once TPN delivered  Active Problems:   Obesity (BMI 30-39.9) Body mass index is 36.62 kg/m.  Small left pleural effusion -In setting of metastatic cancer, may be a malignant effusion  Time spent in minutes: 35 DVT prophylaxis: enoxaparin (LOVENOX) injection 40 mg Start: 09/05/21 1400 SCDs Start:  09/02/21 2150  Code Status: DO NOT INTUBATE Family Communication:  Level of Care: Level of care: Telemetry Disposition Plan:  Status is: Inpatient  Remains inpatient appropriate because: Awaiting TPN deliver tomorrow  Consultants:  General surgery Palliative care Oncology Procedures:  NG tube Antimicrobials:  Anti-infectives (From admission, onward)    Start     Dose/Rate Route Frequency Ordered Stop   09/11/21 1512  ceFAZolin (ANCEF) 2-4 GM/100ML-% IVPB       Note to Pharmacy: Ricardo [name]: cabinet override      09/11/21 1512 09/12/21 0329   09/11/21 1500  ceFAZolin (ANCEF) IVPB 2g/100 mL premix        2 g 200 mL/hr over 30 Minutes Intravenous 30 min pre-op 09/11/21 1453     09/10/21 0000  ceFAZolin (ANCEF) IVPB 2g/100 mL premix        2 g 200 mL/hr over 30 Minutes Intravenous To Radiology 09/09/21 1605 09/11/21 0000        Objective: Vitals:   09/12/21 0500 09/12/21 0806 09/12/21 0835 09/12/21 1355  BP:  129/89 136/88 (!) 117/92  Pulse:  (!) 109 (!) 110 (!) 102  Resp:  (!) _0 Temp:  97.7 F (36.5 C) 97.8 F (36.6 C) 97.8 F (36.6 C)  TempSrc:   Oral Oral  SpO2:  94% 96% 96%  Weight: 119.1 kg     Height:        Intake/Output Summary (Last 24 hours) at 09/12/2021 1601 Last data filed at 09/12/2021 1300 Gross per 24 hour  Intake 2544.18 ml  Output 1200 ml  Net 1344.18 ml    Filed Weights   09/09/21 2016 09/10/21 0105 09/12/21 0500  Weight: 117.3 kg 117.3 kg 119.1 kg    Examination: General exam: Appears comfortable  HEENT: PERRLA, oral mucosa moist, no sclera icterus or thrush Respiratory system: Clear to auscultation. Respiratory effort normal. Cardiovascular system: S1 & S2 heard, regular rate and rhythm Gastrointestinal system: Abdomen soft, non-tender, nondistended. Normal bowel sounds  PEG tube in place and to drainage Central nervous system: Alert and oriented. No focal neurological deficits. Extremities: No cyanosis, clubbing or  edema Skin: No rashes or ulcers Psychiatry:  Mood & affect appropriate.      Data Reviewed: I have personally reviewed following labs and imaging studies  CBC: Recent Labs  Lab 09/06/21 0534 09/07/21 0609 09/08/21 0514 09/09/21 0503 09/10/21 0511  WBC 16.4* 15.6* 14.9* 13.3* 13.7*  NEUTROABS 14.1* 13.1* 12.1* 10.6* 11.3*  HGB 13.4 12.9* 12.6* 12.5* 12.0*  HCT 40.1 38.4* 37.9* 38.1* 36.3*  MCV 89.3 89.1 90.0 90.5 88.8  PLT 230 246 243 260 161    Basic Metabolic Panel: Recent Labs  Lab 09/08/21 0514 09/09/21 0503 09/10/21 0511 09/11/21 0642 09/12/21 0537  NA 133* 133* 136 136 134*  K 4.7 4.5 4.6 4.3 4.1  CL 104 103 106 105 105  CO2 21* _0 GLUCOSE 99 113* 136* 97 133*  BUN 18 21* 23* 22* 20  CREATININE 0.90 1.01 0.96 1.02 0.89  CALCIUM 8.2* 8.2* 8.4* 8.2* 8.0*  MG 2.1 2.1 2.3 2.2 2.1  PHOS 3.5 3.1 3.0 3.5 3.1    GFR: Estimated Creatinine Clearance: 121.6 mL/min (by C-G formula based on SCr of 0.89 mg/dL). Liver Function Tests: Recent Labs  Lab 09/07/21 0609 09/08/21 0514 09/09/21 0503 09/10/21 0511 09/11/21 0642  AST _1 ALT 33 36 32 26 23  ALKPHOS 99 105 117 108 107  BILITOT 0.6 0.5 0.5 0.6 0.4  PROT 7.1 6.9 7.2 6.9 7.0  ALBUMIN 3.0* 3.0* 3.0* 2.8* 2.8*    No results for input(s): LIPASE, AMYLASE in the last 168 hours. No results for input(s): AMMONIA in the last 168 hours. Coagulation Profile: Recent Labs  Lab 09/10/21 0511  INR 1.2    Cardiac Enzymes: No results for input(s): CKTOTAL, CKMB, CKMBINDEX, TROPONINI in the last 168 hours. BNP (last 3 results) No results for input(s): PROBNP in the last 8760 hours. HbA1C: No results for input(s): HGBA1C in the last 72 hours. CBG: Recent Labs  Lab 09/11/21 1724 09/11/21 2019 09/12/21 0021 09/12/21 0448 09/12/21 0646  GLUCAP 73 125* 143* 150* 102*    Lipid Profile: Recent Labs    09/10/21 0511  TRIG 54    Thyroid Function Tests: No results for input(s): TSH,  T4TOTAL, FREET4, T3FREE, THYROIDAB in the last 72 hours. Anemia Panel: No results for input(s): VITAMINB12, FOLATE, FERRITIN, TIBC, IRON, RETICCTPCT in the last 72 hours. Urine analysis:    Component Value Date/Time   COLORURINE YELLOW 09/03/2021 0800   APPEARANCEUR CLEAR 09/03/2021 0800   LABSPEC >1.046 (H) 09/03/2021 0800   PHURINE 5.0 09/03/2021 0800   GLUCOSEU NEGATIVE 09/03/2021 0800   HGBUR NEGATIVE 09/03/2021 0800   BILIRUBINUR NEGATIVE 09/03/2021 0800   KETONESUR NEGATIVE 09/03/2021 0800   PROTEINUR NEGATIVE 09/03/2021 0800   NITRITE NEGATIVE 09/03/2021 0800   LEUKOCYTESUR NEGATIVE 09/03/2021 0800      Radiology Studies: IR GASTROSTOMY TUBE MOD SED  Result Date: 09/11/2021 CLINICAL DATA:  History of  metastatic colon carcinoma with persistent small-bowel obstruction and request to place a venting gastrostomy tube for symptomatic relief. EXAM: PERCUTANEOUS GASTROSTOMY TUBE PLACEMENT ANESTHESIA/SEDATION: Moderate (conscious) sedation was employed during this procedure. A total of Versed 4.0 mg and Fentanyl 100 mcg was administered intravenously by radiology nursing. Moderate Sedation Time: 15 minutes. The patient's level of consciousness and vital signs were monitored continuously by radiology nursing throughout the procedure under my direct supervision. CONTRAST:  63m OMNIPAQUE IOHEXOL 300 MG/ML  SOLN MEDICATIONS: 2 g IV Ancef. IV antibiotic was administered in an appropriate time interval prior to needle puncture of the skin. During the procedure the patient received 0.5 mg IV glucagon. FLUOROSCOPY TIME:  2 minutes and 24 seconds.  51.0 mGy. PROCEDURE: The procedure, risks, benefits, and alternatives were explained to the patient. Questions regarding the procedure were encouraged and answered. The patient understands and consents to the procedure. A time-out was performed prior to initiating the procedure. A 5-French catheter was advanced through the patient's mouth under fluoroscopy  into the esophagus and to the level of the stomach. This catheter was used to insufflate the stomach with air under fluoroscopy. The abdominal wall was prepped with chlorhexidine in a sterile fashion, and a sterile drape was applied covering the operative field. A sterile gown and sterile gloves were used for the procedure. Local anesthesia was provided with 1% Lidocaine. A skin incision was made in the upper abdominal wall. Under fluoroscopy, an 18 gauge trocar needle was advanced into the stomach. Contrast injection was performed to confirm intraluminal position of the needle tip. A single T tack was then deployed in the lumen of the stomach. This was brought up to tension at the skin surface. Over a guidewire, a 9-French sheath was advanced into the lumen of the stomach. The wire was left in place as a safety wire. A loop snare device from a percutaneous gastrostomy kit was then advanced into the stomach. A floppy guide wire was advanced through the orogastric catheter under fluoroscopy in the stomach. The loop snare advanced through the percutaneous gastric access was used to snare the guide wire. This allowed withdrawal of the loop snare out of the patient's mouth by retraction of the orogastric catheter and wire. A 20-French bumper retention gastrostomy tube was looped around the snare device. It was then pulled back through the patient's mouth. The retention bumper was brought up to the anterior gastric wall. The T tack suture was cut at the skin. The exiting gastrostomy tube was cut to appropriate length and a feeding adapter applied. The catheter was injected with contrast material to confirm position and a fluoroscopic spot image saved. The tube was then flushed with saline. A dressing was applied over the gastrostomy exit site. COMPLICATIONS: None. FINDINGS: The stomach distended well with air allowing safe placement of the gastrostomy tube. After placement, the tip of the gastrostomy tube lies in the  body of the stomach. IMPRESSION: Percutaneous gastrostomy with placement of a 20-French bumper retention tube in the body of the stomach. Electronically Signed   By: GAletta EdouardM.D.   On: 09/11/2021 16:11      Scheduled Meds:  Chlorhexidine Gluconate Cloth  6 each Topical Daily   enoxaparin (LOVENOX) injection  40 mg Subcutaneous Daily   insulin aspart  0-9 Units Subcutaneous 4 times per day on Sun Mon   insulin aspart  0-9 Units Subcutaneous 4 times per day   pantoprazole (PROTONIX) IV  40 mg Intravenous Q24H   Continuous Infusions:  ceFAZolin (ANCEF) IV     lactated ringers Stopped (40/97/35 3299)   TPN CYCLIC-ADULT (ION)       LOS: 10 days      Debbe Odea, MD [facility] Pager: [email or web address] 09/12/2021, 4:01 PM

## 2021-09-12 NOTE — Discharge Summary (Signed)
Physician Discharge Summary  Ricardo [name] NGE:[id number] DOB: [birth year] DOA: 09/02/2021  PCP: Ricardo Pollen, MD  Admit date: 09/02/2021 Discharge date: 09/12/2021  Admitted From: home  Disposition:  home   Recommendations for Outpatient Follow-up:  Oncology to f/u on pain control  Home Health:  ordered  Discharge Condition:  stable   CODE STATUS:  Do not intubate or defibrillate   Diet recommendation:  clear liquids only Consultations: Oncology IR General surgery  Procedures/Studies: PEG tube for venting purposes   Discharge Diagnoses:  Principal Problem:   SBO (small bowel obstruction) ([place]) Active Problems:   Obesity (BMI 30-39.9)   Nausea and vomiting   AKI (acute kidney injury) ([place])   Abdominal pain   Leukocytosis   GERD (gastroesophageal reflux disease)   GAD (generalized anxiety disorder)   Colon cancer metastasized to multiple sites [facility])   Pleural effusion, left     Brief Summary: Ricardo [name] yo with hx metastatic colon cancer s/p partial bowel resection in March 2022, GERD, GAD, SBO who presented to [facility] with recurrent SBO.    CT abdomen pelvis with contrast 12/20>  1. Again seen are findings of small-bowel obstruction. Transition point is likely in the mid abdomen where there are clustered small bowel loops adhered to the anterior abdominal wall. Can not exclude underlying obstructive lesion. Degree of distention has increased when compared to the prior exam. There is now air-fluid level in the stomach. 2. Increasing free fluid in the right abdomen. 3. New small left pleural effusion. 4. Stable diffuse omental haziness is unchanged, concerning for metastatic disease.   The patient initially had an NG tube placed which was clamped and later removed.  However he developed obstruction once again.  He is now awaiting a PEG tube for venting purposes.  He also has TPN running.  Dr. Burr Medico is his oncologist    Hospital Course:  Principal  Problem:   SBO (small bowel obstruction) ([place]) -Due to colon cancer with peritoneal metastasis - Not a candidate for HIPEC and not a surgical candidate -General surgery has signed off -Receives chemo per Dr. Burr Medico who plans to continue chemo -Palliative care evaluated the patient on 12/21-he requested not to be intubated or shocked but otherwise wanted to treat aggressively - venting PEG placed on 12/29- working well -   TPN being delivered today - antiemetics and Dilaudid prescribed   Active Problems:   Obesity (BMI 30-39.9) Body mass index is 36.62 kg/m.   Small left pleural effusion -In setting of metastatic cancer, may be a malignant effusion     Discharge Exam: Vitals:   09/12/21 0806 09/12/21 0835  BP: 129/89 136/88  Pulse: (!) 109 (!) 110  Resp: (!) 24 18  Temp: 97.7 F (36.5 C) 97.8 F (36.6 C)  SpO2: 94% 96%   Vitals:   09/11/21 2017 09/12/21 0500 09/12/21 0806 09/12/21 0835  BP: 129/86  129/89 136/88  Pulse: 96  (!) 109 (!) 110  Resp: 20  (!) 24 18  Temp: 98.5 F (36.9 C)  97.7 F (36.5 C) 97.8 F (36.6 C)  TempSrc: Oral   Oral  SpO2: 96%  94% 96%  Weight:  119.1 kg    Height:        General: Pt is alert, awake, not in acute distress Cardiovascular: RRR, S1/S2 +, no rubs, no gallops Respiratory: CTA bilaterally, no wheezing, no rhonchi Abdominal: Soft, NT, ND, bowel sounds + Extremities: no edema, no cyanosis   Discharge Instructions  Discharge  Instructions     Diet clear liquid   Complete by: As directed    Increase activity slowly   Complete by: As directed       Allergies as of 09/12/2021       Reactions   Oxaliplatin Hives, Other (See Comments)   15 minutes into infusion, patient started to complain of feeling warm and states " it feels like the last time I had my reaction"        Medication List     STOP taking these medications    dicyclomine 10 MG capsule Commonly known as: BENTYL   oxyCODONE 5 MG immediate release  tablet Commonly known as: Oxy IR/ROXICODONE       TAKE these medications    acetaminophen 500 MG tablet Commonly known as: TYLENOL Take 500-1,000 mg by mouth every 6 (six) hours as needed for mild pain.   ALPRAZolam 0.25 MG tablet Commonly known as: XANAX Take 1-2 tablets (0.25-0.5 mg total) by mouth at bedtime as needed for anxiety or sleep.   hydrocortisone 2.5 % rectal cream Commonly known as: Anusol-HC Place 1 application rectally 2 (two) times daily.   HYDROmorphone 2 MG tablet Commonly known as: Dilaudid Take 0.5-1 tablets (1-2 mg total) by mouth every 4 (four) hours as needed for severe pain.   naloxone 4 MG/0.1ML Liqd nasal spray kit Commonly known as: NARCAN Use 1 spray for opioid overdose   OVER THE COUNTER MEDICATION Take 1 capsule by mouth daily. Immunocal   pantoprazole 40 MG tablet Commonly known as: Protonix Take 1 tablet (40 mg total) by mouth daily.   polyethylene glycol 17 g packet Commonly known as: MIRALAX / GLYCOLAX Take 17 g by mouth daily as needed for mild constipation.   prochlorperazine 10 MG tablet Commonly known as: COMPAZINE Take 1 tablet (10 mg total) by mouth every 6 (six) hours as needed for nausea or vomiting.        Follow-up Information     [facility] Follow up.   Why: [facility] will provide the TPN.               Allergies  Allergen Reactions   Oxaliplatin Hives and Other (See Comments)    15 minutes into infusion, patient started to complain of feeling warm and states " it feels like the last time I had my reaction"      CT ABDOMEN PELVIS WO CONTRAST  Result Date: 08/27/2021 CLINICAL DATA:  Small-bowel obstruction with left lower quadrant pain. Leukocytosis also. Metastatic colon cancer. Ongoing chemotherapy. EXAM: CT ABDOMEN AND PELVIS WITHOUT CONTRAST TECHNIQUE: Multidetector CT imaging of the abdomen and pelvis was performed following the standard protocol without IV contrast. COMPARISON:  The recent CT with IV  contrast 08/25/2021, CT abdomen and pelvis with IV and oral contrast 07/01/2021. FINDINGS: Lower chest: There is slight increased prominence of a few scattered epipericardial lymph nodes compared with 07/01/2021. No acute lung base findings. Hepatobiliary: No focal liver abnormality is seen. No gallstones, gallbladder wall thickening, or biliary dilatation. Pancreas: Unremarkable. No pancreatic ductal dilatation or surrounding inflammatory changes. Spleen: Unremarkable without contrast. Adrenals/Urinary Tract: No focal abnormality in the adrenal glands and renal cortex. No urinary stones or obstruction. Chronic perinephric stranding is similar. Normal bladder thickness. Stomach/Bowel: There is increasing dilatation of left upper to mid abdominal small bowel and anterior central small bowel up to 5 cm, although there is still relative decompressed appearance in the stomach and duodenum. The dilatation begins to be seen distal to the  ligament of [place]. The exact transition point could not be found but I suspect is probably related to abdominal wall adhesive disease with multiple small bowel segments closely abutting the anterior wall on multiple slices. The transitional segment is probably in the right mid to lower abdomen anteriorly. The appendix surgically absent. Rest of the small bowel is decompressed, some of it apparently having been removed. The large intestine wall is unremarkable except for uncomplicated diverticula along the left segments. Vascular/Lymphatic: There is mild aortoiliac calcific plaque. No enlarged lymph nodes are seen. There is a stable subcentimeter to borderline sized ileocolic mesenteric lymph node in the right lower abdomen on axial 50. Reproductive: Normal prostate. Other: Diffuse haziness in omentum extends to the subphrenic space on the left. There is increased minimal ascites in the perihepatic space and right anterior abdominal mesenteric folds. There are small inguinal fat hernias.  Musculoskeletal: Chronic L5 pars defects and grade 1 L5 spondylolisthesis. No destructive bone lesions. 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 IMPRESSION: 1. Intermediate to high-grade obstruction to approximately the mid small bowel, etiology is probably adhesive disease and the transition is most likely in the anterior right mid to lower abdomen along the abdominal wall. The exact transitional segment could not be located without contrast. 2. Slight increased prominence of a few epipericardial lymph nodes. Small stable ileocolic mesenteric node. 3. Diffuse omental haziness extending into the left subphrenic space concerning for metastatic disease, with small volume of ascites. No free air. 4. Aortic atherosclerosis.  Additional findings as above. Electronically Signed   By: Telford Nab M.D.   On: 08/27/2021 04:19   DG Abd 1 View  Result Date: 09/08/2021 CLINICAL DATA:  Abdominal pain, nausea, vomiting EXAM: ABDOMEN - 1 VIEW COMPARISON:  09/07/2021 FINDINGS: Prominent small bowel loops centrally again noted, similar prior study compatible with persistent small bowel obstruction. No free air or organomegaly. Visualized lung bases clear. IMPRESSION: Continued small bowel obstruction pattern, not significantly changed. Electronically Signed   By: Rolm Baptise M.D.   On: 09/08/2021 15:12   DG Abd 1 View  Result Date: 09/07/2021 CLINICAL DATA:  Nausea and vomiting; EXAM: ABDOMEN - 1 VIEW COMPARISON:  September 04, 2021 ;September 03, 2021; September 02, 2021 FINDINGS: There are a few mildly dilated loops of bowel in the upper abdomen, improved in comparison to prior from September 03, 2021. Interval removal of enteric tube. Partial visualization of CVC tip terminating over the region of the superior cavoatrial junction. No bowel gas visualized in the rectum. IMPRESSION: Persistent mild dilation of several loops of small bowel in the upper abdomen, overall improved since September 03, 2021.  This could reflect persistent small-bowel obstruction. Electronically Signed   By: Valentino Saxon M.D.   On: 09/07/2021 14:00   DG Abdomen 1 View  Result Date: 09/03/2021 CLINICAL DATA:  Nasogastric tube placement EXAM: ABDOMEN - 1 VIEW COMPARISON:  None. FINDINGS: Nasogastric tube tip overlies the expected mid body of the stomach. Multiple gas-filled dilated loops of small bowel are seen within the mid abdomen and left upper quadrant. No free intraperitoneal gas. IMPRESSION: Nasogastric tube tip within the mid body of the stomach. Electronically Signed   By: Fidela Salisbury M.D.   On: 09/03/2021 00:51   CT ABDOMEN PELVIS W CONTRAST  Result Date: 09/02/2021 CLINICAL DATA:  Bowel obstruction suspected. Abdominal pain. Chemotherapy. Three weeks ago. EXAM: CT ABDOMEN AND PELVIS WITH CONTRAST TECHNIQUE: Multidetector CT imaging of the abdomen and pelvis was performed using the standard protocol following  bolus administration of intravenous contrast. CONTRAST:  15m OMNIPAQUE IOHEXOL 350 MG/ML SOLN COMPARISON:  CT abdomen and pelvis 08/27/2021. FINDINGS: Lower chest: There is a new trace left pleural effusion. There is atelectasis in the bilateral lung bases. Hepatobiliary: No focal liver abnormality is seen. No gallstones, gallbladder wall thickening, or biliary dilatation. Pancreas: Unremarkable. No pancreatic ductal dilatation or surrounding inflammatory changes. Spleen: Normal in size without focal abnormality. Adrenals/Urinary Tract: Adrenal glands are unremarkable. Kidneys are normal, without renal calculi, focal lesion, or hydronephrosis. Bladder is unremarkable. Stomach/Bowel: There are dilated mid and proximal small bowel loops with air-fluid levels measuring up to 5 cm. Degree of distention has mildly increased. There is also new moderate air-fluid level within the stomach. Transition point is likely in the mid abdomen where there are multiple small bowel loops demonstrating adhesive disease to the  anterior abdominal wall. This finding is unchanged. The colon is nondilated. The appendix is not seen. There is no pneumatosis or free air identified. Vascular/Lymphatic: Aortic atherosclerosis. No enlarged abdominal or pelvic lymph nodes. Nonenlarged cardiophrenic and ileocecal lymph nodes are unchanged. Reproductive: Prostate is unremarkable. Other: There are small fat containing bilateral inguinal hernias. There is a small amount of free fluid in the anterior right abdomen which has mildly increased. There is mesenteric/omental haziness in the left upper quadrant and anterior upper abdomen similar to the prior examination. Musculoskeletal: There are bilateral pars interarticularis defects at L5 without significant listhesis. IMPRESSION: 1. Again seen are findings of small-bowel obstruction. Transition point is likely in the mid abdomen where there are clustered small bowel loops adhered to the anterior abdominal wall. Can not exclude underlying obstructive lesion. Degree of distention has increased when compared to the prior exam. There is now air-fluid level in the stomach. 2. Increasing free fluid in the right abdomen. 3. New small left pleural effusion. 4. Stable diffuse omental haziness is unchanged, concerning for metastatic disease. Electronically Signed   By: ARonney AstersM.D.   On: 09/02/2021 20:30   CT ABDOMEN PELVIS W CONTRAST  Result Date: 08/25/2021 CLINICAL DATA:  55year old male with history of nausea and vomiting. Suspected bowel obstruction. EXAM: CT ABDOMEN AND PELVIS WITH CONTRAST TECHNIQUE: Multidetector CT imaging of the abdomen and pelvis was performed using the standard protocol following bolus administration of intravenous contrast. CONTRAST:  820mOMNIPAQUE IOHEXOL 350 MG/ML SOLN COMPARISON:  CT of the abdomen and pelvis 07/01/2021. FINDINGS: Lower chest: Tip of central venous catheter terminating at the superior cavoatrial junction. Mild scarring in the posterior aspect of the left  upper lobe. Hepatobiliary: No definite suspicious cystic or solid hepatic lesions. No intra or extrahepatic biliary ductal dilatation. Gallbladder is normal in appearance. Pancreas: No pancreatic mass. No pancreatic ductal dilatation. No pancreatic or peripancreatic fluid collections or inflammatory changes. Spleen: Unremarkable. Adrenals/Urinary Tract: Bilateral kidneys and adrenal glands are normal in appearance. No hydroureteronephrosis. Urinary bladder is normal in appearance. Stomach/Bowel: The appearance of the stomach is normal. No pathologic dilatation of small bowel or colon. Status post appendectomy. Mass-like soft tissue thickening in the region of the terminal ileum best appreciated on axial images 52 and 53 of series 2. Vascular/Lymphatic: Aortic atherosclerosis. No lymphadenopathy noted in the abdomen or pelvis. Prominent but nonenlarged ileocolic lymph node in the right lower quadrant measuring 6 mm in short axis (axial image 50 of series 2), stable compared to the prior study, nonspecific. Reproductive: Prostate gland and seminal vesicles are unremarkable in appearance. Other: There continues to be some mild diffuse haziness throughout the omentum.  Scattered volume of trace ascites is noted, most evident in the right-side of the abdomen (axial image 43 of series 2). No large peritoneal soft tissue masses are confidently identified. No pneumoperitoneum. Musculoskeletal: There are no aggressive appearing lytic or blastic lesions noted in the visualized portions of the skeleton. IMPRESSION: 1. No findings to suggest bowel obstruction. 2. However, there are findings suggestive of intraperitoneal metastatic disease, including residual small volume of ascites and diffuse haziness throughout the omentum, which has increased slightly compared to the prior examination. 3. Aortic atherosclerosis. 4. Additional incidental findings, as above. Electronically Signed   By: Vinnie Langton M.D.   On: 08/25/2021  07:36   IR GASTROSTOMY TUBE MOD SED  Result Date: 09/11/2021 CLINICAL DATA:  History of metastatic colon carcinoma with persistent small-bowel obstruction and request to place a venting gastrostomy tube for symptomatic relief. EXAM: PERCUTANEOUS GASTROSTOMY TUBE PLACEMENT ANESTHESIA/SEDATION: Moderate (conscious) sedation was employed during this procedure. A total of Versed 4.0 mg and Fentanyl 100 mcg was administered intravenously by radiology nursing. Moderate Sedation Time: 15 minutes. The patient's level of consciousness and vital signs were monitored continuously by radiology nursing throughout the procedure under my direct supervision. CONTRAST:  49m OMNIPAQUE IOHEXOL 300 MG/ML  SOLN MEDICATIONS: 2 g IV Ancef. IV antibiotic was administered in an appropriate time interval prior to needle puncture of the skin. During the procedure the patient received 0.5 mg IV glucagon. FLUOROSCOPY TIME:  2 minutes and 24 seconds.  51.0 mGy. PROCEDURE: The procedure, risks, benefits, and alternatives were explained to the patient. Questions regarding the procedure were encouraged and answered. The patient understands and consents to the procedure. A time-out was performed prior to initiating the procedure. A 5-French catheter was advanced through the patient's mouth under fluoroscopy into the esophagus and to the level of the stomach. This catheter was used to insufflate the stomach with air under fluoroscopy. The abdominal wall was prepped with chlorhexidine in a sterile fashion, and a sterile drape was applied covering the operative field. A sterile gown and sterile gloves were used for the procedure. Local anesthesia was provided with 1% Lidocaine. A skin incision was made in the upper abdominal wall. Under fluoroscopy, an 18 gauge trocar needle was advanced into the stomach. Contrast injection was performed to confirm intraluminal position of the needle tip. A single T tack was then deployed in the lumen of the  stomach. This was brought up to tension at the skin surface. Over a guidewire, a 9-French sheath was advanced into the lumen of the stomach. The wire was left in place as a safety wire. A loop snare device from a percutaneous gastrostomy kit was then advanced into the stomach. A floppy guide wire was advanced through the orogastric catheter under fluoroscopy in the stomach. The loop snare advanced through the percutaneous gastric access was used to snare the guide wire. This allowed withdrawal of the loop snare out of the patient's mouth by retraction of the orogastric catheter and wire. A 20-French bumper retention gastrostomy tube was looped around the snare device. It was then pulled back through the patient's mouth. The retention bumper was brought up to the anterior gastric wall. The T tack suture was cut at the skin. The exiting gastrostomy tube was cut to appropriate length and a feeding adapter applied. The catheter was injected with contrast material to confirm position and a fluoroscopic spot image saved. The tube was then flushed with saline. A dressing was applied over the gastrostomy exit site. COMPLICATIONS: None.  FINDINGS: The stomach distended well with air allowing safe placement of the gastrostomy tube. After placement, the tip of the gastrostomy tube lies in the body of the stomach. IMPRESSION: Percutaneous gastrostomy with placement of a 20-French bumper retention tube in the body of the stomach. Electronically Signed   By: Aletta Edouard M.D.   On: 09/11/2021 16:11   DG ABD ACUTE 2+V W 1V CHEST  Result Date: 08/27/2021 CLINICAL DATA:  Metastatic colon cancer. Abdominal pain left lower quadrant with vomiting. EXAM: DG ABDOMEN ACUTE WITH 1 VIEW CHEST COMPARISON:  CT with IV contrast 08/25/2021 FINDINGS: There is increased dilatation of left mid to lower abdominal small bowel up to 5 cm concerning for small bowel obstruction. Small amount of scattered gas and stool remain present in the  colon. There are stable visceral shadows. There is no evidence of free air. No pathologic calcifications. The lungs are generally clear aside from perihilar linear atelectatic changes on the left. No pleural effusion is seen. The cardiac size is normal. Right IJ port catheter tip remains at the cavoatrial junction. IMPRESSION: Increased dilatation of the left abdominal small bowel up to 5 cm concerning for small bowel obstruction. In all other respects no further changes. Electronically Signed   By: Telford Nab M.D.   On: 08/27/2021 02:38   DG Abd Portable 1V-Small Bowel Obstruction Protocol-24 hr delay  Result Date: 09/04/2021 CLINICAL DATA:  Small-bowel obstruction. Bowel obstruction protocol/24 delay image. EXAM: PORTABLE ABDOMEN - 1 VIEW COMPARISON:  Radiographs 09/03/2021 and 08/27/2021.  CT 09/02/2021. FINDINGS: 1120 hours. Two views obtained. Tip of the nasogastric tube projects over the distal stomach. The enteric contrast has passed into the colon which appears decompressed. Previously noted small bowel distension is improved. No extraluminal contrast or air collections are identified. Telemetry leads overlie the chest and upper abdomen. IMPRESSION: Antegrade passage of contrast into decompressed colon with improved small bowel dilatation. No evidence of bowel obstruction or perforation. Electronically Signed   By: Richardean Sale M.D.   On: 09/04/2021 13:43   DG Abd Portable 1V-Small Bowel Obstruction Protocol-initial, 8 hr delay  Result Date: 09/03/2021 CLINICAL DATA:  Small-bowel obstruction. EXAM: PORTABLE ABDOMEN - 1 VIEW COMPARISON:  09/03/2021 FINDINGS: Nasogastric tube overlies expected mid body of the stomach. Contrast is seen within the gastric fundus. There has been little antegrade passage of contrast into the remainder of the abdomen. Multiple loops of gas-filled dilated small bowel are seen within the epigastrium and left abdomen in keeping with changes of a mid small bowel  obstruction. No gross free intraperitoneal gas. No organomegaly. IMPRESSION: No significant antegrade passage of contrast from the gastric lumen. Persistent findings in keeping with a mid small bowel obstruction. Electronically Signed   By: Fidela Salisbury M.D.   On: 09/03/2021 19:48   DG Abd Portable 1V-Small Bowel Obstruction Protocol-initial, 8 hr delay  Result Date: 08/27/2021 CLINICAL DATA:  Small bowel obstruction.  History of colon cancer. EXAM: PORTABLE ABDOMEN - 1 VIEW COMPARISON:  Same day. FINDINGS: Residual contrast is seen through the nondilated colon. Mildly dilated small bowel loops are noted in the upper abdomen concerning for ileus or distal small bowel obstruction. IMPRESSION: Mildly dilated small bowel loops are again noted in upper abdomen concerning for distal small bowel obstruction. Residual contrast is noted in nondilated colon. Electronically Signed   By: Marijo Conception M.D.   On: 08/27/2021 21:41     The results of significant diagnostics from this hospitalization (including imaging, microbiology, ancillary and laboratory)  are listed below for reference.     Microbiology: Recent Results (from the past 240 hour(s))  Resp Panel by RT-PCR (Flu A&B, Covid) Nasopharyngeal Swab     Status: None   Collection Time: 09/02/21 10:16 PM   Specimen: Nasopharyngeal Swab; Nasopharyngeal(NP) swabs in vial transport medium  Result Value Ref Range Status   SARS Coronavirus 2 by RT PCR NEGATIVE NEGATIVE Final    Comment: (NOTE) SARS-CoV-2 target nucleic acids are NOT DETECTED.  The SARS-CoV-2 RNA is generally detectable in upper respiratory specimens during the acute phase of infection. The lowest concentration of SARS-CoV-2 viral copies this assay can detect is 138 copies/mL. A negative result does not preclude SARS-Cov-2 infection and should not be used as the sole basis for treatment or other patient management decisions. A negative result may occur with  improper specimen  collection/handling, submission of specimen other than nasopharyngeal swab, presence of viral mutation(s) within the areas targeted by this assay, and inadequate number of viral copies(<138 copies/mL). A negative result must be combined with clinical observations, patient history, and epidemiological information. The expected result is Negative.  Fact Sheet for Patients:  EntrepreneurPulse.com.au  Fact Sheet for Healthcare Providers:  IncredibleEmployment.be  This test is no t yet approved or cleared by the [place] FDA and  has been authorized for detection and/or diagnosis of SARS-CoV-2 by FDA under an Emergency Use Authorization (EUA). This EUA will remain  in effect (meaning this test can be used) for the duration of the COVID-19 declaration under Section 564(b)(1) of the Act, 21 U.S.C.section 360bbb-3(b)(1), unless the authorization is terminated  or revoked sooner.       Influenza A by PCR NEGATIVE NEGATIVE Final   Influenza B by PCR NEGATIVE NEGATIVE Final    Comment: (NOTE) The Xpert Xpress SARS-CoV-2/FLU/RSV plus assay is intended as an aid in the diagnosis of influenza from Nasopharyngeal swab specimens and should not be used as a sole basis for treatment. Nasal washings and aspirates are unacceptable for Xpert Xpress SARS-CoV-2/FLU/RSV testing.  Fact Sheet for Patients: EntrepreneurPulse.com.au  Fact Sheet for Healthcare Providers: IncredibleEmployment.be  This test is not yet approved or cleared by the [place] FDA and has been authorized for detection and/or diagnosis of SARS-CoV-2 by FDA under an Emergency Use Authorization (EUA). This EUA will remain in effect (meaning this test can be used) for the duration of the COVID-19 declaration under Section 564(b)(1) of the Act, 21 U.S.C. section 360bbb-3(b)(1), unless the authorization is terminated or revoked.  Performed at [facility], [place] [address], [place], [place] [address]      Labs: BNP (last 3 results) No results for input(s): BNP in the last 8760 hours. Basic Metabolic Panel: Recent Labs  Lab 09/08/21 0514 09/09/21 0503 09/10/21 0511 09/11/21 0642 09/12/21 0537  NA 133* 133* 136 136 134*  K 4.7 4.5 4.6 4.3 4.1  CL 104 103 106 105 105  CO2 21* '24 23 24 22  ' GLUCOSE 99 113* 136* 97 133*  BUN 18 21* 23* 22* 20  CREATININE 0.90 1.01 0.96 1.02 0.89  CALCIUM 8.2* 8.2* 8.4* 8.2* 8.0*  MG 2.1 2.1 2.3 2.2 2.1  PHOS 3.5 3.1 3.0 3.5 3.1   Liver Function Tests: Recent Labs  Lab 09/07/21 0609 09/08/21 0514 09/09/21 0503 09/10/21 0511 09/11/21 0642  AST '26 27 23 18 16  ' ALT 33 36 32 26 23  ALKPHOS 99 105 117 108 107  BILITOT 0.6 0.5 0.5 0.6 0.4  PROT 7.1 6.9  7.2 6.9 7.0  ALBUMIN 3.0* 3.0* 3.0* 2.8* 2.8*   No results for input(s): LIPASE, AMYLASE in the last 168 hours. No results for input(s): AMMONIA in the last 168 hours. CBC: Recent Labs  Lab 09/06/21 0534 09/07/21 0609 09/08/21 0514 09/09/21 0503 09/10/21 0511  WBC 16.4* 15.6* 14.9* 13.3* 13.7*  NEUTROABS 14.1* 13.1* 12.1* 10.6* 11.3*  HGB 13.4 12.9* 12.6* 12.5* 12.0*  HCT 40.1 38.4* 37.9* 38.1* 36.3*  MCV 89.3 89.1 90.0 90.5 88.8  PLT 230 246 243 260 285   Cardiac Enzymes: No results for input(s): CKTOTAL, CKMB, CKMBINDEX, TROPONINI in the last 168 hours. BNP: Invalid input(s): POCBNP CBG: Recent Labs  Lab 09/11/21 1724 09/11/21 2019 09/12/21 0021 09/12/21 0448 09/12/21 0646  GLUCAP 73 125* 143* 150* 102*   D-Dimer No results for input(s): DDIMER in the last 72 hours. Hgb A1c No results for input(s): HGBA1C in the last 72 hours. Lipid Profile Recent Labs    09/10/21 0511  TRIG 54   Thyroid function studies No results for input(s): TSH, T4TOTAL, T3FREE, THYROIDAB in the last 72 hours.  Invalid input(s): FREET3 Anemia work up No results for input(s): VITAMINB12, FOLATE, FERRITIN, TIBC, IRON,  RETICCTPCT in the last 72 hours. Urinalysis    Component Value Date/Time   COLORURINE YELLOW 09/03/2021 0800   APPEARANCEUR CLEAR 09/03/2021 0800   LABSPEC >1.046 (H) 09/03/2021 0800   PHURINE 5.0 09/03/2021 0800   GLUCOSEU NEGATIVE 09/03/2021 0800   HGBUR NEGATIVE 09/03/2021 0800   BILIRUBINUR NEGATIVE 09/03/2021 0800   KETONESUR NEGATIVE 09/03/2021 0800   PROTEINUR NEGATIVE 09/03/2021 0800   NITRITE NEGATIVE 09/03/2021 0800   LEUKOCYTESUR NEGATIVE 09/03/2021 0800   Sepsis Labs Invalid input(s): PROCALCITONIN,  WBC,  LACTICIDVEN Microbiology Recent Results (from the past 240 hour(s))  Resp Panel by RT-PCR (Flu A&B, Covid) Nasopharyngeal Swab     Status: None   Collection Time: 09/02/21 10:16 PM   Specimen: Nasopharyngeal Swab; Nasopharyngeal(NP) swabs in vial transport medium  Result Value Ref Range Status   SARS Coronavirus 2 by RT PCR NEGATIVE NEGATIVE Final    Comment: (NOTE) SARS-CoV-2 target nucleic acids are NOT DETECTED.  The SARS-CoV-2 RNA is generally detectable in upper respiratory specimens during the acute phase of infection. The lowest concentration of SARS-CoV-2 viral copies this assay can detect is 138 copies/mL. A negative result does not preclude SARS-Cov-2 infection and should not be used as the sole basis for treatment or other patient management decisions. A negative result may occur with  improper specimen collection/handling, submission of specimen other than nasopharyngeal swab, presence of viral mutation(s) within the areas targeted by this assay, and inadequate number of viral copies(<138 copies/mL). A negative result must be combined with clinical observations, patient history, and epidemiological information. The expected result is Negative.  Fact Sheet for Patients:  EntrepreneurPulse.com.au  Fact Sheet for Healthcare Providers:  IncredibleEmployment.be  This test is no t yet approved or cleared by the  [place] FDA and  has been authorized for detection and/or diagnosis of SARS-CoV-2 by FDA under an Emergency Use Authorization (EUA). This EUA will remain  in effect (meaning this test can be used) for the duration of the COVID-19 declaration under Section 564(b)(1) of the Act, 21 U.S.C.section 360bbb-3(b)(1), unless the authorization is terminated  or revoked sooner.       Influenza A by PCR NEGATIVE NEGATIVE Final   Influenza B by PCR NEGATIVE NEGATIVE Final    Comment: (NOTE) The Xpert Xpress SARS-CoV-2/FLU/RSV plus assay is intended as  an aid in the diagnosis of influenza from Nasopharyngeal swab specimens and should not be used as a sole basis for treatment. Nasal washings and aspirates are unacceptable for Xpert Xpress SARS-CoV-2/FLU/RSV testing.  Fact Sheet for Patients: EntrepreneurPulse.com.au  Fact Sheet for Healthcare Providers: IncredibleEmployment.be  This test is not yet approved or cleared by the [place] FDA and has been authorized for detection and/or diagnosis of SARS-CoV-2 by FDA under an Emergency Use Authorization (EUA). This EUA will remain in effect (meaning this test can be used) for the duration of the COVID-19 declaration under Section 564(b)(1) of the Act, 21 U.S.C. section 360bbb-3(b)(1), unless the authorization is terminated or revoked.  Performed at [facility], [place] [address] [place], [place] [address]      Time coordinating discharge in minutes: 65  SIGNED:   Debbe Odea, MD  [facility] 09/12/2021, 10:27 AM

## 2021-09-13 LAB — BASIC METABOLIC PANEL
Anion gap: 7 (ref 5–15)
BUN: 21 mg/dL — ABNORMAL HIGH (ref 6–20)
CO2: 23 mmol/L (ref 22–32)
Calcium: 7.8 mg/dL — ABNORMAL LOW (ref 8.9–10.3)
Chloride: 104 mmol/L (ref 98–111)
Creatinine, Ser: 0.83 mg/dL (ref 0.61–1.24)
GFR, Estimated: >60 mL/min (ref >60)
Glucose, Bld: 138 mg/dL — ABNORMAL HIGH (ref 70–99)
Potassium: 3.9 mmol/L (ref 3.5–5.1)
Sodium: 134 mmol/L — ABNORMAL LOW (ref 135–145)

## 2021-09-13 LAB — MAGNESIUM: Magnesium: 2.1 mg/dL (ref 1.7–2.4)

## 2021-09-13 LAB — PHOSPHORUS: Phosphorus: 3.2 mg/dL (ref 2.5–4.6)

## 2021-09-13 LAB — GLUCOSE, CAPILLARY: Glucose-Capillary: 141 mg/dL — ABNORMAL HIGH (ref 70–99)

## 2021-09-13 MED ORDER — ALTEPLASE 2 MG IJ SOLR
2.0000 mg | Freq: Once | INTRAMUSCULAR | Status: AC
  Administered 2021-09-13: 2 mg
  Filled 2021-09-13: qty 2

## 2021-09-13 NOTE — Progress Notes (Signed)
RN spoke with Pam from [facility]. Everything is set up for home TPN this evening.  Okay to proceed with discharge home.

## 2021-09-13 NOTE — TOC Transition Note (Signed)
Transition of Care [facility]) - CM/SW Discharge Note   Patient Details  Name: Ricardo [name] MRN: [id number] Date of Birth: November 14, 1965  [facility] CM/SW Contact:  Leeroy Cha, RN Phone Number: 09/13/2021, 9:33 AM   Clinical Narrative:    All agencies are in place for the home tpn.  Pam Chandler handling the tpn and [facility] for rn hhc needs   Final next level of care: Home w Home Health Services Barriers to Discharge: Barriers Resolved   Patient Goals and CMS Choice     Choice offered to / list presented to : Spouse  Discharge Placement                       Discharge Plan and Services In-house Referral: Clinical Social Work Discharge Planning Services: CM Consult Post Acute Care Choice: Home Health          DME Arranged: N/A DME Agency: NA       HH Arranged: RN, TPN HH Agency: [place] Date [facility] Agency Contacted: 09/12/21 Time [place]: [id number] Representative spoke with at [place]: [place] Interventions     Readmission Risk Interventions Readmission Risk Prevention Plan 09/06/2021 08/28/2021  Transportation Screening - Complete  HRI or Home Care Consult Complete Complete  Social Work Consult for [place] Planning/Counseling Complete Complete  Palliative Care Screening Not Applicable Not Applicable  Some recent data might be hidden

## 2021-09-13 NOTE — Plan of Care (Signed)
Pt for discharge this afternoon home with spouse.  AVS printed and reviewed at bedside with both pt and spouse, Ricardo [name].  Follow ups, new medications, port care, and PEG care discussed in detail. Home Health and [place] information included in discharge AVS.  Problem: Clinical Measurements: Goal: Diagnostic test results will improve Outcome: Adequate for Discharge   Problem: Nutrition: Goal: Adequate nutrition will be maintained Outcome: Adequate for Discharge   Problem: Coping: Goal: Level of anxiety will decrease Outcome: Adequate for Discharge   Problem: Safety: Goal: Ability to remain free from injury will improve Outcome: Adequate for Discharge   Problem: Skin Integrity: Goal: Risk for impaired skin integrity will decrease Outcome: Adequate for Discharge   All questions addressed, verbalized understanding.

## 2021-09-13 NOTE — Progress Notes (Signed)
The patient is injury-free, afebrile, alert, and oriented X 4. Vital signs were within the baseline during this shift. He complained of abdominal pain at surgical site, which controlled with current pain regimen. Pt denies chest pain, SOB, nausea, vomiting, dizziness, signs or symptoms of bleeding, or acute changes during this shift. We will continue to monitor and work toward achieving the care plan goals.

## 2021-09-13 NOTE — Plan of Care (Signed)
TRH Plan of care note  Patient is being discharged today. I have answered questions and spoken with his wife as well. Please see my dc summary which was done yesterday.  Debbe Odea, MD

## 2021-09-13 NOTE — Plan of Care (Signed)
°  Problem: Education: Goal: Knowledge of General Education information will improve Description: Including pain rating scale, medication(s)/side effects and non-pharmacologic comfort measures Outcome: Progressing   Problem: Health Behavior/Discharge Planning: Goal: Ability to manage health-related needs will improve Outcome: Progressing   Problem: Clinical Measurements: Goal: Diagnostic test results will improve Outcome: Progressing   Problem: Nutrition: Goal: Adequate nutrition will be maintained Outcome: Progressing   Problem: Coping: Goal: Level of anxiety will decrease Outcome: Progressing   Problem: Elimination: Goal: Will not experience complications related to bowel motility Outcome: Progressing   Problem: Safety: Goal: Ability to remain free from injury will improve Outcome: Progressing   Problem: Skin Integrity: Goal: Risk for impaired skin integrity will decrease Outcome: Progressing

## 2021-09-13 NOTE — Progress Notes (Signed)
Discharge order placed.   Spouse now at bedside, has several questions for MD. Attending notified via secure chat.   RN left voicemail for Pam with [facility], await return call to confirm TPN for this evening prior to discharging patient.  Port will remain accessed. Dressing/Needle Change due 09/18/21.   Per TOC note, [facility] Lemmings is [facility] agency and referral was made.   Will continue to follow situation and discharge home when all arrangements are made and confirmed.

## 2021-09-14 ENCOUNTER — Inpatient Hospital Stay (HOSPITAL_COMMUNITY)
Admission: EM | Admit: 2021-09-14 | Discharge: 2021-09-24 | DRG: 374 | Disposition: A | Discharge status: Home / Self Care | Payer: BC Managed Care – PPO | Attending: Internal Medicine | Admitting: Internal Medicine

## 2021-09-14 ENCOUNTER — Observation Stay (HOSPITAL_COMMUNITY): Payer: BC Managed Care – PPO

## 2021-09-14 ENCOUNTER — Other Ambulatory Visit: Payer: Self-pay

## 2021-09-14 ENCOUNTER — Encounter (HOSPITAL_COMMUNITY): Payer: Self-pay

## 2021-09-14 DIAGNOSIS — C786 Secondary malignant neoplasm of retroperitoneum and peritoneum: Principal | ICD-10-CM | POA: Diagnosis present

## 2021-09-14 DIAGNOSIS — E440 Moderate protein-calorie malnutrition: Secondary | ICD-10-CM | POA: Diagnosis present

## 2021-09-14 DIAGNOSIS — E860 Dehydration: Secondary | ICD-10-CM | POA: Diagnosis present

## 2021-09-14 DIAGNOSIS — E669 Obesity, unspecified: Secondary | ICD-10-CM | POA: Diagnosis present

## 2021-09-14 DIAGNOSIS — K5669 Other partial intestinal obstruction: Secondary | ICD-10-CM | POA: Diagnosis present

## 2021-09-14 DIAGNOSIS — Z79899 Other long term (current) drug therapy: Secondary | ICD-10-CM

## 2021-09-14 DIAGNOSIS — Z20822 Contact with and (suspected) exposure to covid-19: Secondary | ICD-10-CM | POA: Diagnosis present

## 2021-09-14 DIAGNOSIS — J690 Pneumonitis due to inhalation of food and vomit: Secondary | ICD-10-CM | POA: Diagnosis present

## 2021-09-14 DIAGNOSIS — R1110 Vomiting, unspecified: Secondary | ICD-10-CM

## 2021-09-14 DIAGNOSIS — K219 Gastro-esophageal reflux disease without esophagitis: Secondary | ICD-10-CM | POA: Diagnosis present

## 2021-09-14 DIAGNOSIS — Z931 Gastrostomy status: Secondary | ICD-10-CM

## 2021-09-14 DIAGNOSIS — E785 Hyperlipidemia, unspecified: Secondary | ICD-10-CM | POA: Diagnosis present

## 2021-09-14 DIAGNOSIS — R739 Hyperglycemia, unspecified: Secondary | ICD-10-CM | POA: Diagnosis present

## 2021-09-14 DIAGNOSIS — C180 Malignant neoplasm of cecum: Secondary | ICD-10-CM | POA: Diagnosis present

## 2021-09-14 DIAGNOSIS — Z83438 Family history of other disorder of lipoprotein metabolism and other lipidemia: Secondary | ICD-10-CM

## 2021-09-14 DIAGNOSIS — F411 Generalized anxiety disorder: Secondary | ICD-10-CM | POA: Diagnosis present

## 2021-09-14 DIAGNOSIS — D630 Anemia in neoplastic disease: Secondary | ICD-10-CM | POA: Diagnosis present

## 2021-09-14 DIAGNOSIS — Z888 Allergy status to other drugs, medicaments and biological substances status: Secondary | ICD-10-CM

## 2021-09-14 DIAGNOSIS — Z7189 Other specified counseling: Secondary | ICD-10-CM

## 2021-09-14 DIAGNOSIS — Z515 Encounter for palliative care: Secondary | ICD-10-CM

## 2021-09-14 DIAGNOSIS — D72829 Elevated white blood cell count, unspecified: Secondary | ICD-10-CM | POA: Diagnosis present

## 2021-09-14 DIAGNOSIS — Z6836 Body mass index (BMI) 36.0-36.9, adult: Secondary | ICD-10-CM

## 2021-09-14 DIAGNOSIS — R030 Elevated blood-pressure reading, without diagnosis of hypertension: Secondary | ICD-10-CM | POA: Diagnosis present

## 2021-09-14 DIAGNOSIS — Z85038 Personal history of other malignant neoplasm of large intestine: Secondary | ICD-10-CM

## 2021-09-14 DIAGNOSIS — R109 Unspecified abdominal pain: Secondary | ICD-10-CM | POA: Diagnosis not present

## 2021-09-14 DIAGNOSIS — E66812 Obesity, class 2: Secondary | ICD-10-CM

## 2021-09-14 DIAGNOSIS — Z452 Encounter for adjustment and management of vascular access device: Secondary | ICD-10-CM

## 2021-09-14 DIAGNOSIS — Z800 Family history of malignant neoplasm of digestive organs: Secondary | ICD-10-CM

## 2021-09-14 DIAGNOSIS — Z95828 Presence of other vascular implants and grafts: Secondary | ICD-10-CM

## 2021-09-14 DIAGNOSIS — K5900 Constipation, unspecified: Secondary | ICD-10-CM | POA: Diagnosis present

## 2021-09-14 DIAGNOSIS — C772 Secondary and unspecified malignant neoplasm of intra-abdominal lymph nodes: Secondary | ICD-10-CM

## 2021-09-14 DIAGNOSIS — D649 Anemia, unspecified: Secondary | ICD-10-CM | POA: Diagnosis present

## 2021-09-14 DIAGNOSIS — C182 Malignant neoplasm of ascending colon: Secondary | ICD-10-CM

## 2021-09-14 DIAGNOSIS — Z8616 Personal history of COVID-19: Secondary | ICD-10-CM

## 2021-09-14 DIAGNOSIS — C189 Malignant neoplasm of colon, unspecified: Secondary | ICD-10-CM | POA: Diagnosis present

## 2021-09-14 DIAGNOSIS — G479 Sleep disorder, unspecified: Secondary | ICD-10-CM | POA: Diagnosis present

## 2021-09-14 HISTORY — DX: Obesity, unspecified: E66.9

## 2021-09-14 HISTORY — DX: Obesity, class 2: E66.812

## 2021-09-14 LAB — LIPASE, BLOOD: Lipase: 37 U/L (ref 11–51)

## 2021-09-14 LAB — COMPREHENSIVE METABOLIC PANEL
ALT: 22 U/L (ref 0–44)
AST: 23 U/L (ref 15–41)
Albumin: 2.7 g/dL — ABNORMAL LOW (ref 3.5–5.0)
Alkaline Phosphatase: 105 U/L (ref 38–126)
Anion gap: 10 (ref 5–15)
BUN: 19 mg/dL (ref 6–20)
CO2: 23 mmol/L (ref 22–32)
Calcium: 8.2 mg/dL — ABNORMAL LOW (ref 8.9–10.3)
Chloride: 104 mmol/L (ref 98–111)
Creatinine, Ser: 0.83 mg/dL (ref 0.61–1.24)
GFR, Estimated: >60 mL/min (ref >60)
Glucose, Bld: 167 mg/dL — ABNORMAL HIGH (ref 70–99)
Potassium: 3.8 mmol/L (ref 3.5–5.1)
Sodium: 137 mmol/L (ref 135–145)
Total Bilirubin: 0.5 mg/dL (ref 0.3–1.2)
Total Protein: 7.1 g/dL (ref 6.5–8.1)

## 2021-09-14 LAB — GLUCOSE, CAPILLARY
Glucose-Capillary: 92 mg/dL (ref 70–99)
Glucose-Capillary: 111 mg/dL — ABNORMAL HIGH (ref 70–99)

## 2021-09-14 LAB — CBC
HCT: 37.9 % — ABNORMAL LOW (ref 39.0–52.0)
Hemoglobin: 12.5 g/dL — ABNORMAL LOW (ref 13.0–17.0)
MCH: 29.3 pg (ref 26.0–34.0)
MCHC: 33 g/dL (ref 30.0–36.0)
MCV: 88.8 fL (ref 80.0–100.0)
Platelets: 332 10*3/uL (ref 150–400)
RBC: 4.27 MIL/uL (ref 4.22–5.81)
RDW: 14.9 % (ref 11.5–15.5)
WBC: 14.3 10*3/uL — ABNORMAL HIGH (ref 4.0–10.5)
nRBC: 0 % (ref 0.0–0.2)

## 2021-09-14 LAB — HEMOGLOBIN A1C
Hgb A1c MFr Bld: 5.5 % (ref 4.8–5.6)
Mean Plasma Glucose: 111.15 mg/dL

## 2021-09-14 LAB — PHOSPHORUS: Phosphorus: 2.8 mg/dL (ref 2.5–4.6)

## 2021-09-14 LAB — MAGNESIUM: Magnesium: 2.1 mg/dL (ref 1.7–2.4)

## 2021-09-14 LAB — RESP PANEL BY RT-PCR (FLU A&B, COVID) ARPGX2
Influenza A by PCR: NEGATIVE
Influenza B by PCR: NEGATIVE
SARS Coronavirus 2 by RT PCR: NEGATIVE

## 2021-09-14 MED ORDER — SODIUM CHLORIDE 0.9 % IV SOLN
3.0000 g | Freq: Four times a day (QID) | INTRAVENOUS | Status: DC
  Administered 2021-09-14 – 2021-09-19 (×21): 3 g via INTRAVENOUS
  Filled 2021-09-14 (×21): qty 8

## 2021-09-14 MED ORDER — SODIUM CHLORIDE 0.9% FLUSH
10.0000 mL | Freq: Two times a day (BID) | INTRAVENOUS | Status: DC
  Administered 2021-09-15 – 2021-09-23 (×12): 10 mL

## 2021-09-14 MED ORDER — SODIUM CHLORIDE 0.9% FLUSH
10.0000 mL | INTRAVENOUS | Status: DC | PRN
  Administered 2021-09-15 – 2021-09-23 (×2): 10 mL

## 2021-09-14 MED ORDER — POTASSIUM CHLORIDE IN NACL 20-0.9 MEQ/L-% IV SOLN
INTRAVENOUS | Status: AC
  Filled 2021-09-14 (×2): qty 1000

## 2021-09-14 MED ORDER — CHLORHEXIDINE GLUCONATE CLOTH 2 % EX PADS
6.0000 | MEDICATED_PAD | Freq: Every day | CUTANEOUS | Status: DC
  Administered 2021-09-15 – 2021-09-23 (×7): 6 via TOPICAL

## 2021-09-14 MED ORDER — SODIUM CHLORIDE 0.9 % IV SOLN
25.0000 mg | Freq: Four times a day (QID) | INTRAVENOUS | Status: DC | PRN
  Administered 2021-09-14: 25 mg via INTRAVENOUS
  Filled 2021-09-14: qty 25
  Filled 2021-09-14: qty 1

## 2021-09-14 MED ORDER — SODIUM CHLORIDE 0.9 % IV SOLN: INTRAVENOUS | Status: DC | PRN

## 2021-09-14 MED ORDER — SODIUM CHLORIDE 0.9 % IV BOLUS
1000.0000 mL | Freq: Once | INTRAVENOUS | Status: AC
Start: 2021-09-14 — End: 2021-09-14
  Administered 2021-09-14: 1000 mL via INTRAVENOUS

## 2021-09-14 MED ORDER — POTASSIUM CHLORIDE IN NACL 20-0.9 MEQ/L-% IV SOLN
INTRAVENOUS | Status: DC
  Filled 2021-09-14: qty 1000

## 2021-09-14 MED ORDER — INSULIN ASPART 100 UNIT/ML IJ SOLN
0.0000 [IU] | INTRAMUSCULAR | Status: DC
  Administered 2021-09-15: 1 [IU] via SUBCUTANEOUS
  Administered 2021-09-15: 2 [IU] via SUBCUTANEOUS
  Administered 2021-09-15 – 2021-09-16 (×2): 1 [IU] via SUBCUTANEOUS
  Administered 2021-09-18 (×2): 3 [IU] via SUBCUTANEOUS
  Administered 2021-09-19 – 2021-09-21 (×6): 1 [IU] via SUBCUTANEOUS
  Filled 2021-09-14: qty 0.09

## 2021-09-14 MED ORDER — TRAVASOL 10 % IV SOLN
INTRAVENOUS | Status: AC
  Filled 2021-09-14: qty 1254

## 2021-09-14 MED ORDER — LACTATED RINGERS IV BOLUS
1000.0000 mL | Freq: Once | INTRAVENOUS | Status: AC
  Administered 2021-09-14: 1000 mL via INTRAVENOUS

## 2021-09-14 MED ORDER — PANTOPRAZOLE SODIUM 40 MG IV SOLR
40.0000 mg | INTRAVENOUS | Status: DC
  Administered 2021-09-15 – 2021-09-24 (×10): 40 mg via INTRAVENOUS
  Filled 2021-09-14 (×9): qty 40

## 2021-09-14 MED ORDER — ACETAMINOPHEN 650 MG RE SUPP: 650.0000 mg | Freq: Four times a day (QID) | RECTAL | Status: DC | PRN

## 2021-09-14 MED ORDER — METHYLPREDNISOLONE SODIUM SUCC 40 MG IJ SOLR
40.0000 mg | Freq: Once | INTRAMUSCULAR | Status: AC
  Administered 2021-09-14: 40 mg via INTRAVENOUS
  Filled 2021-09-14: qty 1

## 2021-09-14 MED ORDER — HYDROMORPHONE HCL 1 MG/ML IJ SOLN
1.0000 mg | Freq: Once | INTRAMUSCULAR | Status: AC
  Administered 2021-09-14: 1 mg via INTRAVENOUS
  Filled 2021-09-14: qty 1

## 2021-09-14 MED ORDER — ONDANSETRON HCL 4 MG PO TABS: 4.0000 mg | ORAL_TABLET | Freq: Four times a day (QID) | ORAL | Status: DC | PRN

## 2021-09-14 MED ORDER — HYDROMORPHONE HCL 1 MG/ML IJ SOLN
1.0000 mg | INTRAMUSCULAR | Status: DC | PRN
  Administered 2021-09-14 – 2021-09-17 (×26): 1 mg via INTRAVENOUS
  Filled 2021-09-14 (×26): qty 1

## 2021-09-14 MED ORDER — PANTOPRAZOLE SODIUM 40 MG IV SOLR
40.0000 mg | Freq: Once | INTRAVENOUS | Status: AC
  Administered 2021-09-14: 40 mg via INTRAVENOUS
  Filled 2021-09-14: qty 40

## 2021-09-14 MED ORDER — ONDANSETRON HCL 4 MG/2ML IJ SOLN
4.0000 mg | Freq: Once | INTRAMUSCULAR | Status: AC
  Administered 2021-09-14: 4 mg via INTRAVENOUS
  Filled 2021-09-14: qty 2

## 2021-09-14 MED ORDER — ACETAMINOPHEN 325 MG PO TABS
650.0000 mg | ORAL_TABLET | Freq: Four times a day (QID) | ORAL | Status: DC | PRN
  Filled 2021-09-14: qty 2

## 2021-09-14 MED ORDER — ONDANSETRON HCL 4 MG/2ML IJ SOLN
4.0000 mg | Freq: Four times a day (QID) | INTRAMUSCULAR | Status: DC | PRN
  Administered 2021-09-16 – 2021-09-24 (×5): 4 mg via INTRAVENOUS
  Filled 2021-09-14 (×5): qty 2

## 2021-09-14 MED ORDER — ALBUTEROL SULFATE (2.5 MG/3ML) 0.083% IN NEBU: 2.5000 mg | INHALATION_SOLUTION | RESPIRATORY_TRACT | Status: DC | PRN

## 2021-09-14 NOTE — Progress Notes (Signed)
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  He was recently hospitalized and discharged on 09/13/21 with home TPN.  Pharmacy managed his TPN during that admission.  He presented back to the ED on 09/14/21 with c/o abdominal pain and n/v.  Pharmacy has been consulted to manage pt's TPN.  - pt was on cyclic (12 hr) TPN PTA. Patient's wife stated that last TPN bag hung on 09/13/21 at 7:30p and this was stopped on 09/14/21 at 7:30a  Glucose / Insulin: not on insulin Electrolytes: Na, 137, K 3.8, CorrCa wnl - other lytes wnl Renal: scr <1 Hepatic: LFTs wnl - albumin 2.7 Intake / Output; MIVF: NaCL with 48meq/L KCL at 125 ml/hr for 16 hrs per MD GI Imaging: - 09/02/21 abd CT:  findings consistent with small-bowel obstruction, free fluid rt abdomen, small l pleural effusion, stable metastatic disease - 12/26 KUB: continued SBO pattern GI Surgeries / Procedures:  - 5/20 lap appy - 3/22 ex lap  w/ partial bowel resection and ostomy creation - 8/22 diagnostic laprascopy w/ peritoneal biopsies - 12/29: perc gastrostomy  Central access: implanted port 12/12/20 TPN start date: 09/04/21 --> resumed on adm on 09/14/21  Nutritional Goals: Goal TPN 2330ml/24hr (provides 125 g of protein and 2234 kcals per day)  RD Assessment (on 12/29) :   Kcal:  2200-2400 Protein:  115-125g Fluid:  2.2L/day  Current Nutrition:  TPN  Plan:  At 1800: - Continue TPN cyclic rate over 12 hr, from 6pm to 6am - Electrolytes in TPN:  Na 110 mEq/L K 40 mEq/L Ca 57mEq/L Mg 42mEq/L, Phos 48mmol/L.  Cl:Ac 1:1 - Add standard MVI  and trace elements to TPN - Sensitive SSI at 8p, 11p, 7a, noon - MD managing IVF  Ricardo [name] P 09/14/2021,10:18 AM

## 2021-09-14 NOTE — ED Provider Notes (Signed)
[facility] DEPT Provider Note   CSN: [id number] Arrival date & time: 09/14/21  9163     History  Chief Complaint  Patient presents with   Abdominal Pain    Ricardo [name] is a 56 y.o. male.  HPI  56 year old male with a history of colon cancer, recurrent SBO s/p venting gastrostomy tube, who presents to the emergency department today for evaluation of abdominal pain.  Of note, patient recently admitted to the hospital for recurrent SBO and had a gastrostomy tube placed.  He was actually discharged from the hospital yesterday.  Since then he states he has been draining his PEG tube and he continues to have a significant amount of output so he has not drained in several hours.  He is now complaining of severe abdominal pain, distention, nausea vomiting.  He has had no fevers.  He was DC'd with oral Dilaudid which he states is not improving his symptoms.  Home Medications Prior to Admission medications   Medication Sig Start Date End Date Taking? Authorizing Provider  acetaminophen (TYLENOL) 500 MG tablet Take 500-1,000 mg by mouth every 6 (six) hours as needed for mild pain. Patient not taking: Reported on 09/03/2021    [provider]  ALPRAZolam Duanne Moron) 0.25 MG tablet Take 1-2 tablets (0.25-0.5 mg total) by mouth at bedtime as needed for anxiety or sleep. 07/17/21   [name], MD  hydrocortisone (ANUSOL-HC) 2.5 % rectal cream Place 1 application rectally 2 (two) times daily. Patient not taking: Reported on 08/27/2021 03/27/21   Alla Feeling, NP  HYDROmorphone (DILAUDID) 2 MG tablet Take 0.5-1 tablets (1-2 mg total) by mouth every 4 (four) hours as needed for severe pain. 09/12/21 10/12/21  Debbe Odea, MD  naloxone [facility]) nasal spray 4 mg/0.1 mL Use 1 spray for opioid overdose 09/12/21   Debbe Odea, MD  OVER THE COUNTER MEDICATION Take 1 capsule by mouth daily. Immunocal    [provider]  pantoprazole (PROTONIX) 40 MG tablet  Take 1 tablet (40 mg total) by mouth daily. 08/29/21 08/29/22  Pokhrel, Corrie Mckusick, MD  polyethylene glycol (MIRALAX / GLYCOLAX) 17 g packet Take 17 g by mouth daily as needed for mild constipation. Patient not taking: Reported on 09/03/2021    [provider]  prochlorperazine (COMPAZINE) 10 MG tablet Take 1 tablet (10 mg total) by mouth every 6 (six) hours as needed for nausea or vomiting. 01/16/21   [name], MD      Allergies    Oxaliplatin    Review of Systems   Review of Systems  Constitutional:  Negative for fever.  HENT:  Negative for ear pain and sore throat.   Eyes:  Negative for visual disturbance.  Respiratory:  Positive for shortness of breath. Negative for cough.   Cardiovascular:  Negative for chest pain.  Gastrointestinal:  Positive for abdominal distention, abdominal pain, nausea and vomiting.  Genitourinary:  Negative for dysuria and hematuria.  Musculoskeletal:  Negative for back pain.  Skin:  Negative for rash.  Neurological:  Negative for headaches.  All other systems reviewed and are negative.  Physical Exam Updated Vital Signs BP (!) 132/94 (BP Location: Right Arm)    Pulse (!) 109    Temp 97.9 F (36.6 C) (Oral)    Resp 20    SpO2 94%  Physical Exam Vitals and nursing note reviewed.  Constitutional:      General: He is not in acute distress.    Appearance: He is well-developed.  Comments: Appears uncomfortable  HENT:     Head: Normocephalic and atraumatic.  Eyes:     Conjunctiva/sclera: Conjunctivae normal.  Cardiovascular:     Rate and Rhythm: Regular rhythm. Tachycardia present.     Heart sounds: No murmur heard. Pulmonary:     Effort: Pulmonary effort is normal. No respiratory distress.     Breath sounds: Normal breath sounds.  Abdominal:     General: There is distension.     Palpations: Abdomen is soft.     Tenderness: There is generalized abdominal tenderness.     Comments: Gastrostomy tube noted to the mid abdomen  Musculoskeletal:         General: No swelling.     Cervical back: Neck supple.  Skin:    General: Skin is warm and dry.     Capillary Refill: Capillary refill takes less than 2 seconds.  Neurological:     Mental Status: He is alert.  Psychiatric:        Mood and Affect: Mood normal.    ED Results / Procedures / Treatments   Labs (all labs ordered are listed, but only abnormal results are displayed) Labs Reviewed  LIPASE, BLOOD  COMPREHENSIVE METABOLIC PANEL  CBC    EKG None  Radiology No results found.  Procedures Procedures     Medications Ordered in ED Medications  HYDROmorphone (DILAUDID) injection 1 mg (has no administration in time range)  HYDROmorphone (DILAUDID) injection 1 mg (1 mg Intravenous Given 09/14/21 0552)  ondansetron (ZOFRAN) injection 4 mg (4 mg Intravenous Given 09/14/21 0552)  sodium chloride 0.9 % bolus 1,000 mL (1,000 mLs Intravenous New Bag/Given 09/14/21 0612)    ED Course/ Medical Decision Making/ A&P                           Medical Decision Making  56 y/o male presents for eval of abd pain, nv  Recent admission for recurrent bowel obstruction and is recently s/p venting gastrostomy insertion.  Here with persistent nausea vomiting, abdominal pain and distention.  Work-up initiated with laboratory work.  Gastrostomy tube vented and draining.  Patient given IV fluids, antiemetics, pain medication.  Care transitioned to [facility], PA-C with plan to f/u on labs and if abnormal or patient pain not improved then recommend admission.    Final Clinical Impression(s) / ED Diagnoses Final diagnoses:  Abdominal pain, unspecified abdominal location    Rx / DC Orders ED Discharge Orders     None         Bishop Dublin 09/14/21 4627    Ripley Fraise, MD 09/14/21 [phone]

## 2021-09-14 NOTE — Plan of Care (Signed)
  Problem: Education: Goal: Knowledge of General Education information will improve Description: Including pain rating scale, medication(s)/side effects and non-pharmacologic comfort measures Outcome: Progressing   Problem: Pain Managment: Goal: General experience of comfort will improve Outcome: Progressing   Problem: Skin Integrity: Goal: Risk for impaired skin integrity will decrease Outcome: Progressing   

## 2021-09-14 NOTE — H&P (Signed)
History and Physical    BODIN GORKA XNT:[id number] DOB: [birth year] DOA: 09/14/2021  PCP: [name], MD   Patient coming from: Home  I have personally briefly reviewed patient's old medical records in [place]  Chief Complaint: Abdominal pain, nausea and vomiting.  HPI: Ricardo [name] is a 56 y.o. male with medical history significant of osteoarthritis, class II obesity, generalized anxiety disorder, hyperlipidemia, unspecified sleep disorder, GERD, colon cancer with metastasis and peritoneal carcinomatosis who was recently admitted and discharged yesterday due to SBO requiring PEG placement for venting purposes who returns to the hospital today due to recurrence of abdominal pain, multiple episodes of nausea, emesis, despite draining the PEG tube collecting bag 3-4 times yesterday.  He has also been constipated for several days until yesterday where he had a large loose stool BM.  No melena or hematochezia.  No flank pain, dysuria, frequency or hematuria.  He has been having a dry cough with occasional production of yellowish phlegm.  He has been more dyspneic than usual over the past few days and have mild wheezing yesterday as well.  Denied fever, chills, sore throat, rhinorrhea but feels fatigue.  No chest pain, palpitations, diaphoresis, PND, orthopnea, pitting edema of the lower extremities.  He has been occasionally lightheaded with postural changes.  ED Course: Initial vital signs were temperature 97.9 F, pulse 109, respiration 20, BP 132/94 mmHg 4% on room air.  The patient received 1000 mL of NS bolus Zofran 4 mg IVP via EMS and hydromorphone 2 mg IVP x1 in the ED.  Lab work: His CBC showed a white count of 14.3, hemoglobin 12.5 g/dL platelets 332.  CMP showed a glucose of 167 and calcium of 8.2 mg/dL.  Albumin was 2.7 g/dL.  The rest of the CMP resulted values were normal.  Lipase is unremarkable.  Review of Systems: As per HPI otherwise all other systems reviewed  and are negative.  Past Medical History:  Diagnosis Date   Arthritis    Class 2 obesity 09/14/2021   Colon cancer ([place])    with metastasis   Family history of adverse reaction to anesthesia    mother had problem with it due to her asthma   Family history of breast cancer    Family history of prostate cancer    GAD (generalized anxiety disorder) 09/03/2021   GERD (gastroesophageal reflux disease) 09/03/2021   History of COVID-19 04/30/2021   Formatting of this note might be different from the original. 01/2021, asymptomatic   Hyperlipidemia 11/27/2020   Pleural effusion, left 09/06/2021   Sleep disorder 11/10/2018    Past Surgical History:  Procedure Laterality Date   BOWEL RESECTION N/A 11/29/2020   Procedure: PARTIAL BOWEL RESECTION;  Surgeon: Jovita Kussmaul, MD;  Location: [place];  Service: General;  Laterality: N/A;   GASTROSTOMY Left 11/29/2020   Procedure: INSERTION OF GASTROSTOMY TUBE;  Surgeon: Jovita Kussmaul, MD;  Location: [place];  Service: General;  Laterality: Left;   IR GASTROSTOMY TUBE MOD SED  09/11/2021   IR IMAGING GUIDED PORT INSERTION  12/12/2020   LAPAROSCOPIC APPENDECTOMY N/A 01/17/2019   Procedure: APPENDECTOMY LAPAROSCOPIC;  Surgeon: Ralene Ok, MD;  Location: [place];  Service: General;  Laterality: N/A;   LAPAROTOMY N/A 11/29/2020   Procedure: EXPLORATORY LAPAROTOMY;  Surgeon: Jovita Kussmaul, MD;  Location: [place];  Service: General;  Laterality: N/A;  PUT CASE IN ROOM 1 STARTING AT 9:30AM FOR 120 MIN   OSTOMY N/A 11/29/2020  Procedure: POSSIBLE OSTOMY CREATION;  Surgeon: Jovita Kussmaul, MD;  Location: [place];  Service: General;  Laterality: N/A;   RECTAL BIOPSY N/A 11/29/2020   Procedure: PERITIONEAL BIOPSY;  Surgeon: Jovita Kussmaul, MD;  Location: [place];  Service: General;  Laterality: N/A;   SHOULDER SURGERY Left 09/19/2015   Social History  reports that he has never smoked. He has never used smokeless tobacco. He reports that he does not currently use alcohol.  He reports that he does not use drugs.  Allergies  Allergen Reactions   Oxaliplatin Hives and Other (See Comments)    15 minutes into infusion, patient started to complain of feeling warm and states " it feels like the last time I had my reaction"    Family History  Problem Relation Age of Onset   Cancer Sister 48       breast cancer   Cancer Brother 71       prostate cancer    High Cholesterol Brother    Pancreatic cancer Maternal Uncle    Bone cancer Cousin        pat first cousin   Colon cancer Neg Hx    Liver disease Neg Hx    Esophageal cancer Neg Hx    Stomach cancer Neg Hx    Prior to Admission medications   Medication Sig Start Date End Date Taking? Authorizing Provider  acetaminophen (TYLENOL) 500 MG tablet Take 500-1,000 mg by mouth every 6 (six) hours as needed for mild pain. Patient not taking: Reported on 09/03/2021    [provider]  ALPRAZolam Duanne Moron) 0.25 MG tablet Take 1-2 tablets (0.25-0.5 mg total) by mouth at bedtime as needed for anxiety or sleep. 07/17/21   [name], MD  hydrocortisone (ANUSOL-HC) 2.5 % rectal cream Place 1 application rectally 2 (two) times daily. Patient not taking: Reported on 08/27/2021 03/27/21   Alla Feeling, NP  HYDROmorphone (DILAUDID) 2 MG tablet Take 0.5-1 tablets (1-2 mg total) by mouth every 4 (four) hours as needed for severe pain. 09/12/21 10/12/21  Debbe Odea, MD  naloxone [facility]) nasal spray 4 mg/0.1 mL Use 1 spray for opioid overdose 09/12/21   Debbe Odea, MD  OVER THE COUNTER MEDICATION Take 1 capsule by mouth daily. Immunocal    [provider]  pantoprazole (PROTONIX) 40 MG tablet Take 1 tablet (40 mg total) by mouth daily. 08/29/21 08/29/22  Pokhrel, Corrie Mckusick, MD  polyethylene glycol (MIRALAX / GLYCOLAX) 17 g packet Take 17 g by mouth daily as needed for mild constipation. Patient not taking: Reported on 09/03/2021    [provider]  prochlorperazine (COMPAZINE) 10 MG tablet Take 1  tablet (10 mg total) by mouth every 6 (six) hours as needed for nausea or vomiting. 01/16/21   [name], MD   Physical Exam: Vitals:   09/14/21 0745 09/14/21 0800 09/14/21 0815 09/14/21 0830  BP: (!) 133/93 121/69 (!) 132/93 (!) 127/96  Pulse: (!) 103 99 99 97  Resp: 18 (!) 21 (!) 23 (!) 23  Temp:      TempSrc:      SpO2: 90% 92% 90% 92%   Constitutional: Chronically ill-appearing.  NAD, calm, comfortable Eyes: PERRL, lids and conjunctivae normal ENMT: Mucous membranes are mildly dry.  Posterior pharynx clear of any exudate or lesions. Neck: normal, supple, no masses, no thyromegaly Respiratory: Decreased breath sounds in bases R > L, otherwise clear to auscultation bilaterally, no wheezing, no crackles. Normal respiratory effort. No accessory muscle use.  Cardiovascular:  Regular rate and rhythm, no murmurs / rubs / gallops. No extremity edema. 2+ pedal pulses. No carotid bruits.  Abdomen: No distention.  Positive PEG draining yellowish-greenish fluid.  Mild diffuse tenderness, no guarding or rebound, no masses palpated. No hepatosplenomegaly. Bowel sounds positive.  Musculoskeletal: Mild generalized weakness.  No clubbing / cyanosis.  Believes good ROM, no contractures. Normal muscle tone.  Skin: no acute rashes, lesions, ulcers on limited dermatological examination. Neurologic: CN 2-12 grossly intact. Sensation intact, DTR normal. Strength 5/5 in all 4.  Psychiatric: Normal judgment and insight. Alert and oriented x 3. Normal mood.   Labs on Admission: I have personally reviewed following labs and imaging studies  CBC: Recent Labs  Lab 09/08/21 0514 09/09/21 0503 09/10/21 0511 09/14/21 0522  WBC 14.9* 13.3* 13.7* 14.3*  NEUTROABS 12.1* 10.6* 11.3*  --   HGB 12.6* 12.5* 12.0* 12.5*  HCT 37.9* 38.1* 36.3* 37.9*  MCV 90.0 90.5 88.8 88.8  PLT 243 260 285 650    Basic Metabolic Panel: Recent Labs  Lab 09/09/21 0503 09/10/21 0511 09/11/21 0642 09/12/21 0537 09/13/21 0609  09/14/21 0522  NA 133* 136 136 134* 134* 137  K 4.5 4.6 4.3 4.1 3.9 3.8  CL 103 106 105 105 104 104  CO2 24 23 24 22 23 23   GLUCOSE 113* 136* 97 133* 138* 167*  BUN 21* 23* 22* 20 21* 19  CREATININE 1.01 0.96 1.02 0.89 0.83 0.83  CALCIUM 8.2* 8.4* 8.2* 8.0* 7.8* 8.2*  MG 2.1 2.3 2.2 2.1 2.1  --   PHOS 3.1 3.0 3.5 3.1 3.2  --     GFR: Estimated Creatinine Clearance: 130.9 mL/min (by C-G formula based on SCr of 0.83 mg/dL).  Liver Function Tests: Recent Labs  Lab 09/08/21 0514 09/09/21 0503 09/10/21 0511 09/11/21 0642 09/14/21 0522  AST 27 23 18 16 23   ALT 36 32 26 23 22   ALKPHOS 105 117 108 107 105  BILITOT 0.5 0.5 0.6 0.4 0.5  PROT 6.9 7.2 6.9 7.0 7.1  ALBUMIN 3.0* 3.0* 2.8* 2.8* 2.7*   Radiological Exams on Admission: No results found.  EKG: Independently reviewed.   Assessment/Plan Principal Problem:   Intractable abdominal pain Observation/MedSurg. Keep NPO. May have ice chips. Continue IV fluids. TPN per pharmacy. Continue PEG tube drainage. Analgesics as needed. Antiemetic as needed. Parenteral pantoprazole daily Follow-up CBC and CMP in a.m. Consider evaluation by palliative care.  Active Problems:   Leukocytosis Recent baseline. Monitor WBC.    GERD (gastroesophageal reflux disease) Pantoprazole 40 mg IVP daily.    GAD (generalized anxiety disorder) Continue alprazolam as needed. Parenteral lorazepam as needed if unable to PO.    Colon cancer metastasized to multiple sites [facility]) Poor overall prognosis. Scheduled for follow-up with Dr. Burr Medico on 09/18/2021. He responded well to cisplatin in the past but had allergic reaction. Plans to see if able to tolerate cisplatin therapy with premedication.    Class 2 obesity On TPN. Plans to have a chemotherapy.    Moderate protein malnutrition ([place]) Continue TPN per pharmacy.    Normocytic anemia Monitor hematocrit and hemoglobin.    Hyperglycemia Recheck fasting glucose level.     Hyperlipidemia Currently not on therapy.   DVT prophylaxis: SCDs. Code Status:   Full code. Family Communication:  His spouse was at  bedside. Disposition Plan:   Patient is from:  Home.  Anticipated DC to:  Home.  Anticipated DC date:  09/15/2021 for 09/16/2021.  Anticipated DC barriers: Clinical status. Consults called:  Admission status:  Observation/MedSurg.   Severity of Illness: High severity due to recurring abdominal pain with nausea and vomiting.  The patient will be observed for 24 to 48 hours to continue symptoms treatment.  Reubin Milan MD [facility]  How to contact the [facility] Attending or Consulting provider [place] or covering provider during after hours [place], for this patient?   Check the care team in [facility] and look for a) attending/consulting TR[facility] provider listed and b) the [facility] team listed Log into [email or web address] and use [place]'s universal password to access. If you do not have the password, please contact the hospital operator. Locate the [facility] provider you are looking for under [facility] and page to a number that you can be directly reached. If you still have difficulty reaching the provider, please page the [facility] (Director on Call) for the Hospitalists listed on amion for assistance.  09/14/2021, 10:03 AM   This document was prepared using Dragon voice recognition software and may contain some unintended transcription errors.

## 2021-09-14 NOTE — ED Triage Notes (Signed)
Patient discharged yesterday morning from here. Increased abdominal pain and nausea and vomiting. Diagnosed with small bowel obstruction. History of colon cancer and getting chemo.  EMS 4mg  Zofran 24G left hand

## 2021-09-14 NOTE — ED Provider Notes (Signed)
Care assumed from [facility] during shift change.  Please see her note for complete history, she summarized this is a 56 year old male with colon cancer, status post venting gastrotomy tube presenting to the ED for abdominal pain.  He has been having recurrent SBO and had a gastrotomy tube placed and was discharged in the hospital yesterday.  He was discharged with Dilaudid and Zofran, he has been having worsening abdominal pain, distention, nausea and vomiting over the last 24 hours.    Physical Exam  BP (!) 145/90    Pulse (!) 104    Temp 97.9 F (36.6 C) (Oral)    Resp 17    SpO2 93%   Physical Exam Vitals and nursing note reviewed. Exam conducted with a chaperone present.  Constitutional:      General: He is not in acute distress.    Appearance: Normal appearance.  HENT:     Head: Normocephalic and atraumatic.  Eyes:     General: No scleral icterus.    Extraocular Movements: Extraocular movements intact.     Pupils: Pupils are equal, round, and reactive to light.  Cardiovascular:     Rate and Rhythm: Regular rhythm. Tachycardia present.  Pulmonary:     Effort: Pulmonary effort is normal.     Breath sounds: Normal breath sounds.  Abdominal:     General: A surgical scar is present. There is distension.     Comments: PEG tube in place, no surrounding erythema or purulent material.  Abdomen is distended with diffuse tenderness  Skin:    Coloration: Skin is not jaundiced.  Neurological:     Mental Status: He is alert. Mental status is at baseline.     Coordination: Coordination normal.    ED Course/Procedures     Procedures  MDM  This is a 56 year old male with history of colon cancer and recurrent small bowel obstructions.  He was discharged to the hospital yesterday, still having significant pain, nausea, vomiting.  The labs do not show any gross derangement or significant leukocytosis compared to a few days ago when he was admitted.  His pain is still a 7 out of 10  despite narcotics.  I do not feel strongly at this time he needs repeat CT, I do think he needs admission for pain control at this time.  I will consult with the hospitalist.  Spoke with Dr. Olevia Bowens with hospital service who is agreeable to admit the patient.  He would like to consult general surgery to make sure they do not think the patient needs CT repeat imaging.  I will consult general surgery.  Spoke with Dr. [name] with general surgery who reviewed the patient's chart with me.  Per most recent surgical note on 09/06/2021 it was documented that patient is not a candidate for any other surgical options at this time.  It is his recommendation for oncologic eval and advisement and possibly palliative care.      Sherrill Raring, PA-C 09/14/21 0759    Lacretia Leigh, MD 09/22/21 1023

## 2021-09-14 NOTE — Progress Notes (Signed)
[facility] admitting physician addendum:  The patient was complaining of worsening dyspnea earlier.  He may have aspirated.  CXR showing left lower lobe infiltrate.  Single dose Solu-Medrol and Unasyn 3 g IVPB every 6 hours ordered.  Tennis Must, MD

## 2021-09-15 DIAGNOSIS — C189 Malignant neoplasm of colon, unspecified: Secondary | ICD-10-CM | POA: Diagnosis not present

## 2021-09-15 DIAGNOSIS — Z931 Gastrostomy status: Secondary | ICD-10-CM | POA: Diagnosis not present

## 2021-09-15 DIAGNOSIS — C180 Malignant neoplasm of cecum: Secondary | ICD-10-CM | POA: Diagnosis present

## 2021-09-15 DIAGNOSIS — E785 Hyperlipidemia, unspecified: Secondary | ICD-10-CM | POA: Diagnosis present

## 2021-09-15 DIAGNOSIS — R1110 Vomiting, unspecified: Secondary | ICD-10-CM | POA: Diagnosis present

## 2021-09-15 DIAGNOSIS — R109 Unspecified abdominal pain: Secondary | ICD-10-CM | POA: Diagnosis not present

## 2021-09-15 DIAGNOSIS — F411 Generalized anxiety disorder: Secondary | ICD-10-CM | POA: Diagnosis present

## 2021-09-15 DIAGNOSIS — Z515 Encounter for palliative care: Secondary | ICD-10-CM | POA: Diagnosis not present

## 2021-09-15 DIAGNOSIS — C182 Malignant neoplasm of ascending colon: Secondary | ICD-10-CM | POA: Diagnosis not present

## 2021-09-15 DIAGNOSIS — R112 Nausea with vomiting, unspecified: Secondary | ICD-10-CM | POA: Diagnosis not present

## 2021-09-15 DIAGNOSIS — Z20822 Contact with and (suspected) exposure to covid-19: Secondary | ICD-10-CM | POA: Diagnosis present

## 2021-09-15 DIAGNOSIS — D630 Anemia in neoplastic disease: Secondary | ICD-10-CM | POA: Diagnosis present

## 2021-09-15 DIAGNOSIS — Z7189 Other specified counseling: Secondary | ICD-10-CM | POA: Diagnosis not present

## 2021-09-15 DIAGNOSIS — Z79899 Other long term (current) drug therapy: Secondary | ICD-10-CM | POA: Diagnosis not present

## 2021-09-15 DIAGNOSIS — J690 Pneumonitis due to inhalation of food and vomit: Secondary | ICD-10-CM | POA: Diagnosis present

## 2021-09-15 DIAGNOSIS — Z888 Allergy status to other drugs, medicaments and biological substances status: Secondary | ICD-10-CM | POA: Diagnosis not present

## 2021-09-15 DIAGNOSIS — E669 Obesity, unspecified: Secondary | ICD-10-CM | POA: Diagnosis present

## 2021-09-15 DIAGNOSIS — K5669 Other partial intestinal obstruction: Secondary | ICD-10-CM | POA: Diagnosis present

## 2021-09-15 DIAGNOSIS — C786 Secondary malignant neoplasm of retroperitoneum and peritoneum: Secondary | ICD-10-CM | POA: Diagnosis present

## 2021-09-15 DIAGNOSIS — Z83438 Family history of other disorder of lipoprotein metabolism and other lipidemia: Secondary | ICD-10-CM | POA: Diagnosis not present

## 2021-09-15 DIAGNOSIS — K5900 Constipation, unspecified: Secondary | ICD-10-CM | POA: Diagnosis present

## 2021-09-15 DIAGNOSIS — E440 Moderate protein-calorie malnutrition: Secondary | ICD-10-CM | POA: Diagnosis present

## 2021-09-15 DIAGNOSIS — R030 Elevated blood-pressure reading, without diagnosis of hypertension: Secondary | ICD-10-CM | POA: Diagnosis present

## 2021-09-15 DIAGNOSIS — Z6836 Body mass index (BMI) 36.0-36.9, adult: Secondary | ICD-10-CM | POA: Diagnosis not present

## 2021-09-15 DIAGNOSIS — Z800 Family history of malignant neoplasm of digestive organs: Secondary | ICD-10-CM | POA: Diagnosis not present

## 2021-09-15 DIAGNOSIS — R739 Hyperglycemia, unspecified: Secondary | ICD-10-CM | POA: Diagnosis present

## 2021-09-15 DIAGNOSIS — Z85038 Personal history of other malignant neoplasm of large intestine: Secondary | ICD-10-CM | POA: Diagnosis not present

## 2021-09-15 DIAGNOSIS — K219 Gastro-esophageal reflux disease without esophagitis: Secondary | ICD-10-CM | POA: Diagnosis present

## 2021-09-15 DIAGNOSIS — Z8616 Personal history of COVID-19: Secondary | ICD-10-CM | POA: Diagnosis not present

## 2021-09-15 DIAGNOSIS — G479 Sleep disorder, unspecified: Secondary | ICD-10-CM | POA: Diagnosis present

## 2021-09-15 LAB — CBC
HCT: 35.1 % — ABNORMAL LOW (ref 39.0–52.0)
Hemoglobin: 11.2 g/dL — ABNORMAL LOW (ref 13.0–17.0)
MCH: 29 pg (ref 26.0–34.0)
MCHC: 31.9 g/dL (ref 30.0–36.0)
MCV: 90.9 fL (ref 80.0–100.0)
Platelets: 280 10*3/uL (ref 150–400)
RBC: 3.86 MIL/uL — ABNORMAL LOW (ref 4.22–5.81)
RDW: 14.7 % (ref 11.5–15.5)
WBC: 14.2 10*3/uL — ABNORMAL HIGH (ref 4.0–10.5)
nRBC: 0 % (ref 0.0–0.2)

## 2021-09-15 LAB — COMPREHENSIVE METABOLIC PANEL
ALT: 19 U/L (ref 0–44)
AST: 20 U/L (ref 15–41)
Albumin: 2.5 g/dL — ABNORMAL LOW (ref 3.5–5.0)
Alkaline Phosphatase: 88 U/L (ref 38–126)
Anion gap: 5 (ref 5–15)
BUN: 18 mg/dL (ref 6–20)
CO2: 24 mmol/L (ref 22–32)
Calcium: 8 mg/dL — ABNORMAL LOW (ref 8.9–10.3)
Chloride: 110 mmol/L (ref 98–111)
Creatinine, Ser: 0.89 mg/dL (ref 0.61–1.24)
GFR, Estimated: >60 mL/min (ref >60)
Glucose, Bld: 161 mg/dL — ABNORMAL HIGH (ref 70–99)
Potassium: 4.4 mmol/L (ref 3.5–5.1)
Sodium: 139 mmol/L (ref 135–145)
Total Bilirubin: 0.6 mg/dL (ref 0.3–1.2)
Total Protein: 6.3 g/dL — ABNORMAL LOW (ref 6.5–8.1)

## 2021-09-15 LAB — GLUCOSE, CAPILLARY
Glucose-Capillary: 127 mg/dL — ABNORMAL HIGH (ref 70–99)
Glucose-Capillary: 86 mg/dL (ref 70–99)
Glucose-Capillary: 127 mg/dL — ABNORMAL HIGH (ref 70–99)
Glucose-Capillary: 107 mg/dL — ABNORMAL HIGH (ref 70–99)

## 2021-09-15 LAB — MAGNESIUM: Magnesium: 2.1 mg/dL (ref 1.7–2.4)

## 2021-09-15 LAB — PHOSPHORUS: Phosphorus: 2.7 mg/dL (ref 2.5–4.6)

## 2021-09-15 LAB — TRIGLYCERIDES: Triglycerides: 68 mg/dL (ref <150)

## 2021-09-15 MED ORDER — TRAVASOL 10 % IV SOLN
INTRAVENOUS | Status: AC
  Filled 2021-09-15: qty 1254

## 2021-09-15 MED ORDER — ALPRAZOLAM 0.25 MG PO TABS
0.2500 mg | ORAL_TABLET | Freq: Every evening | ORAL | Status: DC | PRN
  Administered 2021-09-15: 0.25 mg via ORAL
  Filled 2021-09-15: qty 1

## 2021-09-15 NOTE — Progress Notes (Signed)
Initial Nutrition Assessment  DOCUMENTATION CODES:   Obesity unspecified  INTERVENTION:  - TPN regimen per Pharmacist.   NUTRITION DIAGNOSIS:   Increased nutrient needs related to chronic illness, cancer and cancer related treatments as evidenced by estimated needs.  GOAL:   Patient will meet greater than or equal to 90% of their needs  MONITOR:   Labs, Weight trends, I & O's, Other (Comment) (TPN regimen)  REASON FOR ASSESSMENT:   Consult New TPN/TNA  ASSESSMENT:   56 y.o. 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 He was discharged from [facility] on 12/31; during that admission had PEG placed for venting purposes. He returned to the ED on 1/1 due to recurrent abdominal pain and multiple episodes of N/V despite draining bag from PEG 3-4 times since returning home. In the ED, CXR showed LLL opacity concerning for aspiration; started on abx.  Patient was NPO throughout last hospitalization and is currently NPO with ice chips and sips with meds.  Patient laying in bed with no visitors present at the time of RD visit.   Patient shares that during the limited time he was at home between hospitalizations he was not advised to consume anything PO.   Weight yesterday was 260 lb and weight has been stable since 06/05/21.  He has an implanted port in R chest which was placed on 12/12/20. He is receiving cyclic TPN from 4825-0037 which is providing 2380 ml, 2234 kcal and 125 grams protein.    Labs reviewed; CBG: 127 mmol/l, Ca: 8 mg/dl. Medications reviewed; sliding scale novolog, 40 mg IV protonix/day. IVF; NS @ 50 ml/hr.      NUTRITION - FOCUSED PHYSICAL EXAM:  No muscle or fat depletions, no edema at this time.   Diet Order:   Diet Order             Diet NPO time specified Except for: Ice Chips, Sips with Meds  Diet effective  now                   EDUCATION NEEDS:   No education needs have been identified at this time  Skin:  Skin Assessment: Reviewed RN Assessment  Last BM:  12/31 (type 1)  Height:   Ht Readings from Last 1 Encounters:  09/14/21 5\' 11"  (1.803 m)    Weight:   Wt Readings from Last 1 Encounters:  09/14/21 117.8 kg     Estimated Nutritional Needs:  Kcal:  2200-2400 kcal Protein:  115-125 grams Fluid:  >/= 2.4 L/day      Jarome Matin, MS, RD, LDN, CNSC Inpatient Clinical Dietitian RD pager # available in AMION  After hours/weekend pager # available in [facility]

## 2021-09-15 NOTE — Progress Notes (Signed)
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  He was recently hospitalized and discharged on 09/13/21 with home TPN.  Pharmacy managed his TPN during that admission.  He presented back to the ED on 09/14/21 with c/o abdominal pain and n/v.  Pharmacy has been consulted to manage pt's TPN.  - pt was on cyclic (12 hr) TPN PTA. Patient's wife stated that last TPN bag hung on 09/13/21 at 7:30p and this was stopped on 09/14/21 at 7:30a  Glucose / Insulin: No hx DM. Hgb A1c 5.5% -CBG goal <180; CBG range: 92-161. 3 units SSI/24 hrs. -Solu-Medrol 40 mg IV x1 dose given 1/1 Electrolytes: All lytes WNL, including CorrCa (9.2) Renal: scr <1; stable. BUN WNL Hepatic: LFTs wnl - albumin low Intake / Output; MIVF: No MIVF.  -Strict I/O not measured. UOP: 300 mL; Drain output: 1800 mL; stool x1 unmeasured GI Imaging: - 09/02/21 abd CT:  findings consistent with small-bowel obstruction, free fluid rt abdomen, small l pleural effusion, stable metastatic disease - 12/26 KUB: continued SBO pattern GI Surgeries / Procedures:  - 5/20 lap appy - 3/22 ex lap  w/ partial bowel resection and ostomy creation - 8/22 diagnostic laprascopy w/ peritoneal biopsies - 12/29: perc gastrostomy  Central access: implanted port 12/12/20 TPN start date: 09/04/21 --> resumed on adm on 09/14/21  Nutritional Goals: Goal TPN 2337ml/24hr (provides 125 g of protein and 2234 kcals per day)  RD Assessment (on 12/29) :   Kcal:  2200-2400 Protein:  115-125g Fluid:  2.2L/day  Current Nutrition:  TPN  Plan:  At 1800: Continue TPN cyclic  rate over 12 hr, from 6pm to 6am Electrolytes in TPN: Slightly decrease K Na 110 mEq/L K 35 mEq/L Ca 37mEq/L Mg 59mEq/L Phos 47mmol/L Cl:Ac 1:1 Add standard MVI and trace elements to TPN Sensitive SSI at 8p, 11p, 7a, noon MD managing IVF Recheck electrolytes with AM labs tomorrow. TPN labs Mon, Thurs.  Lenis Noon, PharmD 09/15/2021,7:22 AM

## 2021-09-15 NOTE — Progress Notes (Signed)
Transition of Care [facility]) Screening Note  Patient Details  Name: Ricardo [name] Date of Birth: 03-15-1966  Transition of Care [facility]) CM/SW Contact:    Sherie Don, LCSW Phone Number: 09/15/2021, 9:23 AM  Transition of Care Department [facility]) has reviewed patient and no TOC needs have been identified at this time. We will continue to monitor patient advancement through interdisciplinary progression rounds. If new patient transition needs arise, please place a TOC consult.

## 2021-09-15 NOTE — Progress Notes (Signed)
PROGRESS NOTE    Ricardo [name]  DGU:[id number] DOB: Jul 06, 1966 DOA: 09/14/2021 PCP: [name], MD   Chief Complaint  Patient presents with   Abdominal Pain    Brief Narrative/Hospital Course: Ricardo [name], 56 y.o. 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 In the ED afebrile mildly tachycardic, labs with leukocytosis likely poor, stable hemoglobin and renal function Lumin low 2.7 Abdominal x-ray showed interval improvement in gaseous distention of the small and large bowel loops compared with 12/26 X-ray showed left lower lung opacity concern about aspiration placed on antibiotics   Subjective: Seen and examined this morning.  Complains of ongoing abdominal pain needing IV opiates PEG tube is draining Reports he had passed flatus this morning Afebrile overnight. Saturating 94% on room air. Persistent elevated WBC count 14.2K  Assessment & Plan:  Intractable abdominal pain with multiple episodes of nausea and vomiting: Recent admission for SBO, status post PEG placement for venting. Patient symptom preceded by constipation, which likely contributed, and also given her cancer.  Imaging studies showed improvement in the gaseous distention, passing flatus,.  Continue ice chips and N.p.o. TPN per pharmacy, IV fluids, consult palliative care continue [place] antiemetics PPI and pain control with IV/oral opiates, WILL notify Dr. Burr Medico today.  Aspiration pneumonia: Likely from  vomiting.  Continue IV Unasyn monitor WBC count Leukocytosis he has had persistent leukocytosis we will monitor on antibiotics as above  Colon cancer with mets to multiple sites, overall poor prognosis has a follow-up with Dr. Morey Hummingbird on 1/5.  He responded well to cisplatin in the past but had allergic reaction, plan was to see if he is he is able to tolerate cisplatin therapy with premedication  GERD continue PPI GAD: Continue home regimen-IV if unable to take p.o. HLD-not on statin. Moderate protein malnutrition continue TPN Normocytic anemia from anemia of malignancy coronary disease monitor Hyperglycemia monitor  Class II Obesity:Patient's Body mass index is 36.22 kg/m. : Will benefit with PCP follow-up, weight loss  healthy lifestyle and outpatient sleep evaluation.  DVT prophylaxis: SCDs Start: 09/14/21 9563 Code Status:   Code Status: Partial Code Family Communication: plan of care discussed with patient at bedside. Status is: Admitted  as observation Remains hospitalized for ongoing management of intra-abdominal pain nausea vomiting  Disposition: Currently not medically stable for discharge. Anticipated Disposition: TBD  Objective: Vitals last 24 hrs: Vitals:   09/14/21 1957 09/15/21 0000 09/15/21 0412 09/15/21 1022  BP: 137/87 124/87 117/68 124/72  Pulse: 90 88 71 74  Resp: 18 16 14 18   Temp: 98.1 F (36.7 C) 98 F (36.7 C) 98.2 F (36.8 C) 98.6 F (37 C)  TempSrc: Oral Oral Oral Oral  SpO2: 95%  94% 95%  Weight:      Height:       Weight change:   Intake/Output Summary (Last 24 hours) at 09/15/2021 1036 Last data filed at 09/15/2021 0957 Gross per 24 hour  Intake 4274.31 ml  Output 2100 ml  Net 2174.31 ml   Net IO Since Admission: 3,174.31 mL [09/15/21 1036]  Physical Examination: General exam: AA0X3, obese, in mild pain pleasant HEENT:Oral mucosa moist, Ear/Nose WNL grossly,dentition normal. Respiratory system: B/l clear BS, no use of accessory muscle, non  tender. Cardiovascular system: S1 & S2 +, no JVD. Gastrointestinal system: Abdomen soft-obese,PEG-in place,BS sluggish, mildly tender,ND, BS+. Nervous System:Alert, awake, moving extremities. Extremities: edema none, distal peripheral pulses palpable.  Skin: No rashes, no icterus. MSK: Normal muscle bulk, tone, power.  Medications reviewed:  Scheduled Meds:  Chlorhexidine Gluconate Cloth  6 each Topical Daily   insulin aspart  0-9 Units Subcutaneous 4 times per day   pantoprazole (PROTONIX) IV  40 mg Intravenous Q24H   sodium chloride flush  10-40 mL Intracatheter Q12H   Continuous Infusions:  sodium chloride 50 mL/hr at 09/15/21 0304   ampicillin-sulbactam (UNASYN) IV 3 g (09/15/21 0617)   promethazine (PHENERGAN) injection (IM or IVPB) 25 mg (32/44/01 0272)   TPN CYCLIC-ADULT (ION)     Diet Order             Diet NPO time specified Except for: Ice Chips, Sips with Meds  Diet effective now                 Weight change:   Wt Readings from Last 3 Encounters:  09/14/21 117.8 kg  09/13/21 119.8 kg  08/27/21 118 kg  Consultants:see note  Procedures:see note Antimicrobials: Anti-infectives (From admission, onward)    Start     Dose/Rate Route Frequency Ordered Stop   09/14/21 1230  Ampicillin-Sulbactam (UNASYN) 3 g in sodium chloride 0.9 % 100 mL IVPB        3 g 200 mL/hr over 30 Minutes Intravenous Every 6 hours 09/14/21 1133        Culture/Microbiology    Component Value Date/Time   SDES STOOL 01/20/2009 2030   SDES STOOL 01/20/2009 2030   SDES STOOL 01/20/2009 2030   [place] NONE 01/20/2009 2030   [place] NONE 01/20/2009 2030   [place] 01/20/2009 2030   CULT  01/20/2009 2030    NO SALMONELLA, SHIGELLA, CAMPYLOBACTER, OR YERSINIA ISOLATED   REPTSTATUS 01/21/2009 FINAL 01/20/2009 2030   REPTSTATUS 01/25/2009 FINAL 01/20/2009 2030   REPTSTATUS 01/23/2009 FINAL 01/20/2009 2030    Other culture-see note  Unresulted Labs (From admission,  onward)     Start     Ordered   09/16/21 5366  Basic metabolic panel  Tomorrow morning,   R        09/15/21 0746   09/16/21 0500  Magnesium  Tomorrow morning,   R        09/15/21 0746   09/16/21 0500  Phosphorus  Tomorrow morning,   R        09/15/21 0746   09/15/21 0500  Triglycerides  (TPN Lab Panel)  Tomorrow morning,   R        09/14/21 1106   09/15/21 0500  Comprehensive metabolic panel  (TPN Lab Panel)  Every Mon,Thu (0500),   R      09/14/21 1106   09/15/21 0500  Magnesium  (TPN Lab Panel)  Every Mon,Thu (0500),   R      09/14/21 1106   09/15/21 0500  Phosphorus  (TPN Lab Panel)  Every Mon,Thu (0500),   R      09/14/21 1106   09/15/21 0500  Triglycerides  (TPN Lab Panel)  Every Monday (0500),   R      09/14/21 1106   09/14/21 1142  Strep pneumoniae urinary antigen  Once,   R  09/14/21 1142           Data Reviewed: I have personally reviewed following labs and imaging studies CBC: Recent Labs  Lab 09/09/21 0503 09/10/21 0511 09/14/21 0522 09/15/21 0331  WBC 13.3* 13.7* 14.3* 14.2*  NEUTROABS 10.6* 11.3*  --   --   HGB 12.5* 12.0* 12.5* 11.2*  HCT 38.1* 36.3* 37.9* 35.1*  MCV 90.5 88.8 88.8 90.9  PLT 260 285 332 638   Basic Metabolic Panel: Recent Labs  Lab 09/11/21 0642 09/12/21 0537 09/13/21 0609 09/14/21 0522 09/14/21 0613 09/15/21 0331  NA 136 134* 134* 137  --  139  K 4.3 4.1 3.9 3.8  --  4.4  CL 105 105 104 104  --  110  CO2 24 22 23 23   --  24  GLUCOSE 97 133* 138* 167*  --  161*  BUN 22* 20 21* 19  --  18  CREATININE 1.02 0.89 0.83 0.83  --  0.89  CALCIUM 8.2* 8.0* 7.8* 8.2*  --  8.0*  MG 2.2 2.1 2.1  --  2.1 2.1  PHOS 3.5 3.1 3.2  --  2.8 2.7   GFR: Estimated Creatinine Clearance: 121 mL/min (by C-G formula based on SCr of 0.89 mg/dL). Liver Function Tests: Recent Labs  Lab 09/09/21 0503 09/10/21 0511 09/11/21 0642 09/14/21 0522 09/15/21 0331  AST 23 18 16 23 20   ALT 32 26 23 22 19   ALKPHOS 117 108 107 105 88  BILITOT 0.5  0.6 0.4 0.5 0.6  PROT 7.2 6.9 7.0 7.1 6.3*  ALBUMIN 3.0* 2.8* 2.8* 2.7* 2.5*   Recent Labs  Lab 09/14/21 0522  LIPASE 37   No results for input(s): AMMONIA in the last 168 hours. Coagulation Profile: Recent Labs  Lab 09/10/21 0511  INR 1.2   Cardiac Enzymes: No results for input(s): CKTOTAL, CKMB, CKMBINDEX, TROPONINI in the last 168 hours. BNP (last 3 results) No results for input(s): PROBNP in the last 8760 hours. HbA1C: Recent Labs    09/14/21 1136  HGBA1C 5.5   CBG: Recent Labs  Lab 09/12/21 2315 09/13/21 0607 09/14/21 1229 09/14/21 1954 09/15/21 0642  GLUCAP 137* 141* 92 111* 127*   Lipid Profile: Recent Labs    09/15/21 0331  TRIG 68   Thyroid Function Tests: No results for input(s): TSH, T4TOTAL, FREET4, T3FREE, THYROIDAB in the last 72 hours. Anemia Panel: No results for input(s): VITAMINB12, FOLATE, FERRITIN, TIBC, IRON, RETICCTPCT in the last 72 hours. Sepsis Labs: No results for input(s): PROCALCITON, LATICACIDVEN in the last 168 hours.  Recent Results (from the past 240 hour(s))  Resp Panel by RT-PCR (Flu A&B, Covid) Nasopharyngeal Swab     Status: None   Collection Time: 09/14/21  7:46 AM   Specimen: Nasopharyngeal Swab; Nasopharyngeal(NP) swabs in vial transport medium  Result Value Ref Range Status   SARS Coronavirus 2 by RT PCR NEGATIVE NEGATIVE Final    Comment: (NOTE) SARS-CoV-2 target nucleic acids are NOT DETECTED.  The SARS-CoV-2 RNA is generally detectable in upper respiratory specimens during the acute phase of infection. The lowest concentration of SARS-CoV-2 viral copies this assay can detect is 138 copies/mL. A negative result does not preclude SARS-Cov-2 infection and should not be used as the sole basis for treatment or other patient management decisions. A negative result may occur with  improper specimen collection/handling, submission of specimen other than nasopharyngeal swab, presence of viral mutation(s) within  the areas targeted by this assay, and inadequate number of viral copies(<138 copies/mL).  A negative result must be combined with clinical observations, patient history, and epidemiological information. The expected result is Negative.  Fact Sheet for Patients:  EntrepreneurPulse.com.au  Fact Sheet for Healthcare Providers:  IncredibleEmployment.be  This test is no t yet approved or cleared by the [place] FDA and  has been authorized for detection and/or diagnosis of SARS-CoV-2 by FDA under an Emergency Use Authorization (EUA). This EUA will remain  in effect (meaning this test can be used) for the duration of the COVID-19 declaration under Section 564(b)(1) of the Act, 21 U.S.C.section 360bbb-3(b)(1), unless the authorization is terminated  or revoked sooner.       Influenza A by PCR NEGATIVE NEGATIVE Final   Influenza B by PCR NEGATIVE NEGATIVE Final    Comment: (NOTE) The Xpert Xpress SARS-CoV-2/FLU/RSV plus assay is intended as an aid in the diagnosis of influenza from Nasopharyngeal swab specimens and should not be used as a sole basis for treatment. Nasal washings and aspirates are unacceptable for Xpert Xpress SARS-CoV-2/FLU/RSV testing.  Fact Sheet for Patients: EntrepreneurPulse.com.au  Fact Sheet for Healthcare Providers: IncredibleEmployment.be  This test is not yet approved or cleared by the [place] FDA and has been authorized for detection and/or diagnosis of SARS-CoV-2 by FDA under an Emergency Use Authorization (EUA). This EUA will remain in effect (meaning this test can be used) for the duration of the COVID-19 declaration under Section 564(b)(1) of the Act, 21 U.S.C. section 360bbb-3(b)(1), unless the authorization is terminated or revoked.  Performed at [facility], [place] [address], [place], [place] [address]      Radiology Studies: X-ray chest PA  and lateral  Result Date: 09/14/2021 CLINICAL DATA:  56 year old male with a history of colon cancer, recurrent SBO s/p venting gastrostomy tube, who presents to the emergency department today for evaluation of abdominal pain. Of note, patient recently admitted to the hospital for recurrent SBO and had a gastrostomy tube placed. He was actually discharged from the hospital yesterday. Since then he states he has been draining his PEG tube and he continues to have a significant amount of output so he has not drained in several hours. He is now complaining of severe abdominal pain, distention, nausea vomiting. EXAM: CHEST - 2 VIEW COMPARISON:  01/04/2021. FINDINGS: Increased opacity at the left lung base compared to the prior exam, consistent with atelectasis, pneumonia or a combination. Remainder of the lungs is clear. Small left pleural effusion.  No pneumothorax. Cardiac silhouette normal in size. No mediastinal or hilar masses or evidence of adenopathy. Stable right internal jugular Port-A-Cath. Skeletal structures are grossly intact. IMPRESSION: 1. Left lower lung opacity consistent with a combination of atelectasis/pneumonia and a small effusion, new compared to the prior chest radiograph. Electronically Signed   By: Lajean Manes M.D.   On: 09/14/2021 10:59   Abd 1 View (KUB)  Result Date: 09/14/2021 CLINICAL DATA:  History of colon cancer with recurrent small bowel obstruction. Patient presents with abdominal pain. EXAM: ABDOMEN - 1 VIEW COMPARISON:  09/08/21 FINDINGS: Left upper quadrant gastrostomy tube identified. Since the previous exam there is been interval improvement in gaseous distension the of the small and large bowel loops. No new findings. IMPRESSION: Interval improvement in gaseous distension of the small and large bowel loops compared with 09/08/2021. Electronically Signed   By: Kerby Moors M.D.   On: 09/14/2021 10:57     LOS: 0 days   Antonieta Pert, MD [facility]  09/15/2021,  10:36 AM

## 2021-09-15 NOTE — Plan of Care (Signed)
Plan of care reviewed and discussed with the patient. 

## 2021-09-15 NOTE — Plan of Care (Signed)
Plan of care reviewed and discussed with the patient and his wife. 

## 2021-09-16 ENCOUNTER — Telehealth: Payer: Self-pay | Admitting: Hematology

## 2021-09-16 DIAGNOSIS — R109 Unspecified abdominal pain: Secondary | ICD-10-CM

## 2021-09-16 DIAGNOSIS — E440 Moderate protein-calorie malnutrition: Secondary | ICD-10-CM

## 2021-09-16 DIAGNOSIS — Z7189 Other specified counseling: Secondary | ICD-10-CM

## 2021-09-16 DIAGNOSIS — Z515 Encounter for palliative care: Secondary | ICD-10-CM

## 2021-09-16 LAB — BASIC METABOLIC PANEL
Anion gap: 7 (ref 5–15)
BUN: 20 mg/dL (ref 6–20)
CO2: 23 mmol/L (ref 22–32)
Calcium: 7.8 mg/dL — ABNORMAL LOW (ref 8.9–10.3)
Chloride: 108 mmol/L (ref 98–111)
Creatinine, Ser: 0.81 mg/dL (ref 0.61–1.24)
GFR, Estimated: >60 mL/min (ref >60)
Glucose, Bld: 95 mg/dL (ref 70–99)
Potassium: 4.1 mmol/L (ref 3.5–5.1)
Sodium: 138 mmol/L (ref 135–145)

## 2021-09-16 LAB — MAGNESIUM: Magnesium: 2 mg/dL (ref 1.7–2.4)

## 2021-09-16 LAB — GLUCOSE, CAPILLARY
Glucose-Capillary: 91 mg/dL (ref 70–99)
Glucose-Capillary: 116 mg/dL — ABNORMAL HIGH (ref 70–99)
Glucose-Capillary: 192 mg/dL — ABNORMAL HIGH (ref 70–99)
Glucose-Capillary: 98 mg/dL (ref 70–99)
Glucose-Capillary: 133 mg/dL — ABNORMAL HIGH (ref 70–99)
Glucose-Capillary: 110 mg/dL — ABNORMAL HIGH (ref 70–99)

## 2021-09-16 LAB — PHOSPHORUS: Phosphorus: 4 mg/dL (ref 2.5–4.6)

## 2021-09-16 MED ORDER — DEXAMETHASONE 4 MG PO TABS
4.0000 mg | ORAL_TABLET | Freq: Every day | ORAL | Status: DC
  Administered 2021-09-16 – 2021-09-24 (×9): 4 mg via ORAL
  Filled 2021-09-16 (×9): qty 1

## 2021-09-16 MED ORDER — FENTANYL 75 MCG/HR TD PT72
1.0000 | MEDICATED_PATCH | TRANSDERMAL | Status: DC
Start: 2021-09-16 — End: 2021-09-18
  Administered 2021-09-16: 1 via TRANSDERMAL
  Filled 2021-09-16: qty 1

## 2021-09-16 MED ORDER — ALPRAZOLAM 0.25 MG PO TABS
0.2500 mg | ORAL_TABLET | Freq: Three times a day (TID) | ORAL | Status: DC | PRN
  Administered 2021-09-16 – 2021-09-23 (×11): 0.25 mg via ORAL
  Filled 2021-09-16 (×11): qty 1

## 2021-09-16 MED ORDER — ENOXAPARIN SODIUM 40 MG/0.4ML IJ SOSY
40.0000 mg | PREFILLED_SYRINGE | INTRAMUSCULAR | Status: DC
  Administered 2021-09-16 – 2021-09-24 (×9): 40 mg via SUBCUTANEOUS
  Filled 2021-09-16 (×9): qty 0.4

## 2021-09-16 MED ORDER — TRAVASOL 10 % IV SOLN
INTRAVENOUS | Status: AC
  Filled 2021-09-16: qty 1254

## 2021-09-16 NOTE — Telephone Encounter (Signed)
Cancelled 01/05 and 01/07 due to 01/03 scheduled message, patient is currently in the hospital.

## 2021-09-16 NOTE — Progress Notes (Signed)
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  He was recently hospitalized and discharged on 09/13/21 with home TPN.  Pharmacy managed his TPN during that admission.  He presented back to the ED on 09/14/21 with c/o abdominal pain and n/v.  Pharmacy has been consulted to manage pt's TPN.  - pt was on cyclic (12 hr) TPN PTA. Patient's wife stated that last TPN bag hung on 09/13/21 at 7:30p and this was stopped on 09/14/21 at 7:30a  Glucose / Insulin: No hx DM. Hgb A1c 5.5% -CBG goal <180; CBG range: 86-127. 1 units SSI/24 hrs. -Solu-Medrol 40 mg IV x1 dose given 1/1 Electrolytes: All lytes WNL, including CorrCa (9) Renal: scr <1; stable. BUN WNL Hepatic: LFTs WNL - albumin low Intake / Output; MIVF: No MIVF. I/O Net: +768 mL -UOP: 600 mL; Drain output: increased 3100 mL; stool: none GI Imaging: - 09/02/21 abd CT:  findings consistent with small-bowel obstruction, free fluid rt abdomen, small l pleural effusion, stable metastatic disease - 12/26 KUB: continued SBO pattern GI Surgeries / Procedures:  - 5/20 lap appy - 3/22 ex lap  w/ partial bowel resection and ostomy creation - 8/22 diagnostic laprascopy w/ peritoneal biopsies - 12/29: perc gastrostomy  Central access: implanted port 12/12/20 TPN start date: 09/04/21 --> resumed on adm on 09/14/21  Nutritional Goals: Goal TPN 2368mL/24hr (provides 125 g of protein and 2234 kcals per day)  RD Assessment (on 12/29) : Estimated Needs Total Energy Estimated Needs: 2200-2400 kcal Total Protein Estimated Needs: 115-125 grams Total Fluid Estimated Needs:  >/= 2.4 L/day  Current Nutrition:  TPN NPO except for ice chips, sips with meds  Plan:  At 1800: Continue TPN cyclic rate over 12 hr, from 6pm to 6am Electrolytes in TPN: No changes Na 110 mEq/L K 35 mEq/L Ca 58mEq/L Mg 54mEq/L Phos 65mmol/L Cl:Ac 1:1 Add standard MVI and trace elements to TPN Sensitive SSI at 8p, 11p, 7a, noon MD managing IVF TPN labs Mon, Thurs  Lenis Noon, PharmD 09/16/2021,7:22 AM

## 2021-09-16 NOTE — Consult Note (Signed)
Palliative Care Consult Note                                  Date: 09/16/2021   Patient Name: Ricardo [name]  DOB: 08-21-1966  MRN: [id number]  Age / Sex: 56 y.o., male  PCP: [name], MD Referring Physician: Antonieta Pert, MD  Reason for Consultation: Establishing goals of care, Non pain symptom management, and Pain control  HPI/Patient Profile: Palliative Care consult requested for goals of care discussion in this 56 y.o. male  with past medical history of  metastatic colon cancer s/p partial bowel resection (11/2020), GERD, and generalized anxiety. Patient recently hospitalized 12/14 and 12/20 for SBO which was improved by conservative measures. CT of abdomen pelvis showed small bowel obstruction with transition point likely in the mid abdomen. Venting G-tube was placed prior to discharge. Ricardo [name] was discharged on 09/13/2021 and required readmission on 09/14/2021 via EMS from home with similar concerns of abdominal pain, nausea, and vomiting. Imaging showes improvement in gaseous distention, however continues to have significant G-tube output, abdominal pain, nausea, and vomiting.   Past Medical History:  Diagnosis Date   Arthritis    Class 2 obesity 09/14/2021   Colon cancer [facility])    with metastasis   Family history of adverse reaction to anesthesia    mother had problem with it due to her asthma   Family history of breast cancer    Family history of prostate cancer    GAD (generalized anxiety disorder) 09/03/2021   GERD (gastroesophageal reflux disease) 09/03/2021   History of COVID-19 04/30/2021   Formatting of this note might be different from the original. 01/2021, asymptomatic   Hyperlipidemia 11/27/2020   Pleural effusion, left 09/06/2021   Sleep disorder 11/10/2018     Subjective:   This NP Osborne Oman reviewed medical records, received report from team, assessed the patient and then met at the patient's bedside with  Ricardo [name] and his wife to discuss goals of care and symptom management.   I created space and opportunity for patient and family to explore state of health prior to admission, thoughts, and feelings.  Ricardo [name] is frustrated that he is back in the hospital in such a short period of time.  He states he was hopeful things will improve with a venting tube however this has not been the case despite significant output.  He reports taking his oral Dilaudid with minimal to no pain relief.  We discussed His current illness and what it means in the larger context of His on-going co-morbidities. Natural disease trajectory and expectations were discussed.  He is clear and expressed goals to continue with ongoing treatment if at all possible.  Is remaining hopeful for some stability to allow him to return home to a somewhat decent quality of life.  He understands the severity of his disease and long-term outlook.  We discussed at length his current pain regimen.  He is currently receiving IV Dilaudid which she feels is effective for several hours.  He states if he can get some rest after receiving medication he generally can extend the timeframe out however this has not been the case recently.  Education provided on gaining better pain control in addition to his refractory nausea and vomiting.  Discussed the use of fentanyl patch in the setting of nausea and the risk of vomiting pills back up.  We also discussed use of PCA  pump while hospitalized however patient is reluctant to this.  He is aware we will work to get him on the best regimen possible to provide him effective some relief.   I discussed the importance of continued conversation with family and their medical providers regarding overall plan of care and treatment options, ensuring decisions are within the context of the patients values and GOCs.  Questions and concerns were addressed.  Patient and wife was encouraged to call with questions or concerns.  PMT  will continue to support holistically as needed.   Objective:   Primary Diagnoses: Present on Admission:  Intractable abdominal pain  Colon cancer metastasized to multiple sites (HCC)  GERD (gastroesophageal reflux disease)  Leukocytosis  Hyperlipidemia  GAD (generalized anxiety disorder)  Class 2 obesity  Moderate protein malnutrition (HCC)  Normocytic anemia  Hyperglycemia   Scheduled Meds:  Chlorhexidine Gluconate Cloth  6 each Topical Daily   enoxaparin (LOVENOX) injection  40 mg Subcutaneous Q24H   insulin aspart  0-9 Units Subcutaneous 4 times per day   pantoprazole (PROTONIX) IV  40 mg Intravenous Q24H   sodium chloride flush  10-40 mL Intracatheter Q12H    Continuous Infusions:  sodium chloride 50 mL/hr at 09/16/21 0304   ampicillin-sulbactam (UNASYN) IV 3 g (09/16/21 0543)   promethazine (PHENERGAN) injection (IM or IVPB) 25 mg (52/84/13 2440)   TPN CYCLIC-ADULT (ION)      PRN Meds: sodium chloride, acetaminophen **OR** acetaminophen, albuterol, ALPRAZolam, HYDROmorphone (DILAUDID) injection, ondansetron **OR** ondansetron (ZOFRAN) IV, promethazine (PHENERGAN) injection (IM or IVPB), sodium chloride flush  Allergies  Allergen Reactions   Oxaliplatin Hives and Other (See Comments)    15 minutes into infusion, patient started to complain of feeling warm and states " it feels like the last time I had my reaction"    Review of Systems  Constitutional:  Positive for appetite change.  Gastrointestinal:  Positive for abdominal pain, nausea and vomiting.  Neurological:  Positive for weakness.  Unless otherwise noted, a complete review of systems is negative.  Physical Exam General: NAD, Cardiovascular: regular rate and rhythm Pulmonary: normal breathing pattern, diminished bilaterally  Abdomen: soft, tender, distended, PEG in place Extremities: no edema, no joint deformities Skin: no rashes, warm and dry Neurological: AAO x3  Vital Signs:  BP (!) 146/96 (BP  Location: Right Arm)    Pulse 99    Temp 99.1 F (37.3 C) (Oral)    Resp 18    Ht '5\' 11"'  (1.803 m)    Wt 117.8 kg    SpO2 94%    BMI 36.22 kg/m  Pain Scale: 0-10 POSS *See Group Information*: 1-Acceptable,Awake and alert Pain Score: 7   SpO2: SpO2: 94 % O2 Device:SpO2: 94 % O2 Flow Rate: .   IO: Intake/output summary:  Intake/Output Summary (Last 24 hours) at 09/16/2021 1111 Last data filed at 09/16/2021 1027 Gross per 24 hour  Intake 3916.97 ml  Output 3900 ml  Net 16.97 ml    LBM: Last BM Date: 09/14/21 Baseline Weight: Weight: 117.8 kg Most recent weight: Weight: 117.8 kg      Palliative Assessment/Data:    Advanced Care Planning:   Primary Decision Maker: PATIENT  Code Status/Advance Care Planning: Limited code  Ricardo [name] confirms wishes for limited code. NO CPR or MECHANICAL INTUBATION.   Assessment & Plan:   SUMMARY OF RECOMMENDATIONS   LIMITED CODE-as confirmed by patient and wife Continue with current plan of care. Treat the treatable. Ricardo [name] is clear in his expressed  wishes to continue pursuing chemo treatments if possible.  Potential plans to have upcoming treatment while hospitalized if stable. Fentanyl patch 75 mcg every 72 hours Dexamethasone 4 mg daily PMT will continue to support and follow as needed. Please call team line with urgent needs.  Symptom Management:  Abdominal pain Patient has received 11 mg of IV Dilaudid over the past 24 hours.  Conversion for fentanyl patch completed.  Will initiate fentanyl patch 75 mcg every 72 hours with concern of intolerance of oral pain medication in the setting of nausea/vomiting. Continue IV Dilaudid as needed   Nausea/vomiting Zofran 4 mg as needed Phenergan 25 mg as needed Decadron 4 mg daily Due to high G-tube output (3700 mL / 24 hours) we will consider use of octreotide 30 mg IM long-acting for malignant bowel obstruction/hypersecretion. Anxiety Xanax 0.25 3 times daily as needed.  Patient continues  to have persistent nausea/vomiting could consider transitioning to lorazepam for anxiety and nausea.   Palliative Prophylaxis:  Aspiration, Bowel Regimen, and Frequent Pain Assessment  Additional Recommendations (Limitations, Scope, Preferences): Continue to treat the treatable, no chest compressions no mechanical intubation  Psycho-social/Spiritual:  Desire for further Chaplaincy support: no Additional Recommendations:  Ongoing goals of care discussions  Prognosis:  Guarded-Poor  Discharge Planning:  To Be Determined   Patient and wife expressed understanding and was in agreement with this plan.    Time Total: 55 min  Visit consisted of counseling and education dealing with the complex and emotionally intense issues of symptom management and palliative care in the setting of serious and potentially life-threatening illness.Greater than 50%  of this time was spent counseling and coordinating care related to the above assessment and plan.  Signed by:  Alda Lea, AGPCNP-BC Palliative Medicine Team  Phone: [phone] Pager: [phone] Amion: Bjorn Pippin   Thank you for allowing the Palliative Medicine Team to assist in the care of this patient. Please utilize secure chat with additional questions, if there is no response within 30 minutes please call the above phone number. Palliative Medicine Team providers are available by phone from 7am to 5pm daily and can be reached through the team cell phone.  Should this patient require assistance outside of these hours, please call the patient's attending physician.

## 2021-09-16 NOTE — Progress Notes (Addendum)
HEMATOLOGY-ONCOLOGY PROGRESS NOTE  ASSESSMENT AND PLAN: 1.  Metastatic colon cancer with peritoneal metastasis 2.  Abdominal pain, nausea, vomiting 3.  Aspiration pneumonia 4.  Protein calorie malnutrition 5.  Goals of care discussion  -We discussed that abdominal symptoms are likely related to disease progression from his metastatic malignancy.  The patient is aware of the incurable nature of his malignancy and that additional chemotherapy may not be fully effective in treating his symptoms.  However, he would like to consider additional chemotherapy if it is offered. -We discussed that we need better symptom control before considering chemotherapy.  We have reached out to the palliative care team to assist with pain control and control of his nausea and vomiting. -If he improves clinically, will consider systemic chemotherapy later this week. -Discussed CODE STATUS with the patient and he expressed that he would not be interested in mechanical ventilation or CPR.  He is agreeable to medications. -He will continue on TPN for his protein calorie malnutrition.  SUBJECTIVE: Ricardo [name] is followed by our office for metastatic colon cancer.  He has been receiving palliative chemotherapy.  He has had several recent admissions for small bowel obstruction.  Now readmitted with abdominal pain, nausea, vomiting.  He is on TPN and also has a venting gastrostomy tube.  Abdominal x-ray performed on admission showed interval improvement in the gaseous distention of the small and large bowel loops.  Chest x-ray from admission showed left lower lung opacity consistent with a combination of atelectasis/pneumonia and he is currently being treated for aspiration pneumonia.  The patient was seen and examined today.  He reports ongoing abdominal pain.  States pain is never less than 5-6 out of 10.  He also reports that he has not moved his bowels in about 8 days.  Denies flatus.  He reported that he vomited this  morning.  He has been having nausea as well.  Oncology History Overview Note  Cancer Staging metastatic cecal cancer Staging form: Colon and Rectum, AJCC 8th Edition - Clinical stage from 11/29/2020: Stage IVC (cTX, cN2, pM1c) - Signed by [name], MD on 12/04/2020 Stage prefix: Initial diagnosis Histologic grade (G): G3 Histologic grading system: 4 grade system    metastatic cecal cancer  11/27/2020 Imaging   CT Angio CAP  IMPRESSION: 1. Thickening of the terminal ileum with associated proximal mid to distal small bowel obstruction. Findings could be due to an ileitis versus malignancy. Nonspecific mesenteric edema could be due to engorgement versus metastases. No associated bowel perforation. Recommend endoscopy for further evaluation. 2. Indeterminate right lower quadrant lymphadenopathy. 3. Scattered colonic diverticulosis with no acute diverticulitis. 4. Stable right hepatic lobe subcentimeter hyperdensity likely represents a hepatic hemangioma. 5. No acute vascular abnormality. Aortic Atherosclerosis (ICD10-I70.0) - mild. 6. No acute intrathoracic abnormality.   11/28/2020 Imaging   CT AP  IMPRESSION: 1. There is masslike, circumferential thickening of the terminal ileum and cecal base near the ileocecal valve and abnormally enlarged lymph nodes in the right lower quadrant mesentery adjacent to the terminal ileum measuring up to 2.3 x 1.4 cm. 2. There is extensive omental and peritoneal nodularity and caking throughout the abdomen. 3. Findings are highly concerning for primary colon malignancy with nodal and peritoneal metastatic disease. 4. Small volume perihepatic and perisplenic ascites. 5. The small bowel is generally decompressed, with full some fluid-filled, nondistended loops throughout. There is transit of oral enteric contrast to the terminal ileum. No evidence of overt bowel obstruction at this time. Esophagogastric tube is position  with tip and side port  below the diaphragm. 6. Atelectasis or consolidation of the dependent bilateral lung bases, new compared to prior examination.   Aortic Atherosclerosis (ICD10-I70.0).     11/29/2020 Surgery   EXPLORATORY LAPAROTOMY, PARTIAL BOWEL RESECTION, POSSIBLE OSTOMY CREATION, PERITIONEAL BIOPSY, INSERTION OF GASTROSTOMY TUBE by Dr [name]   11/29/2020 Initial Biopsy   FINAL MICROSCOPIC DIAGNOSIS:   AB. 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    COMMENT:   Immunohistochemistry (IHC) for CK20 and CDX-2 is strong and diffusely  positive.  CK7, TTF-1, Synaptophysin, Chromogranin and CD56 are  negative.  The immunophenotype is compatible with origin from the lower  gastrointestinal tract.  IHC for MMR will be reported separately.  Case  preliminarily discussed with Dr. [name] on 12/02/2020.   At the request of Dr. [name], [phone] was reviewed in retrospect.  Review of the submitted sections confirms the presence of acute  appendicitis.  No malignancy is identified.    11/29/2020 Cancer Staging   Staging form: Colon and Rectum, AJCC 8th Edition - Clinical stage from 11/29/2020: Stage IVC (cTX, cN2, pM1c) - Signed by [name], MD on 12/04/2020 Stage prefix: Initial diagnosis Histologic grade (G): G3 Histologic grading system: 4 grade system    12/04/2020 Initial Diagnosis   Cancer of ascending colon metastatic to intra-abdominal lymph node (Real)    Chemotherapy   FOLFOX q2weeks    12/18/2020 - 02/15/2021 Chemotherapy      Patient is on Antibody Plan: COLORECTAL BEVACIZUMAB Q14D     01/03/2021 Imaging   CT A/P IMPRESSION: 1. Wall thickening of the terminal ileum again seen. Fluid-filled distal small bowel, ascending, and transverse colon without obstruction. 2. Equivocal improvement in omental and peritoneal metastatic disease with slightly improved small volume perihepatic and perisplenic  ascites. Right lower quadrant mesenteric adenopathy is similar or mildly improved. 3. Sigmoid colonic diverticulosis. Mild mural wall thickening in the sigmoid, no acute diverticulitis. 4. Gastrostomy tube in place, balloon normally positioned in the stomach. 5. Chronic bilateral L5 pars interarticularis defects with trace anterolisthesis of L5 on S1. Chronic avascular necrosis of the left femoral head.   01/16/2021 Genetic Testing   Negative genetic testing. CTNNA1 [id number] VUS, RAD51D p.G140E VUS and TSC1 p.R768H VUS found on the CancerNext-Expanded+RNAinsight.  The CancerNext-Expanded gene panel offered by [employer] and includes sequencing and rearrangement analysis for the following 77 genes: AIP, ALK, APC*, ATM*, AXIN2, BAP1, BARD1, BLM, BMPR1A, BRCA1*, BRCA2*, BRIP1*, CDC73, CDH1*, CDK4, CDKN1B, CDKN2A, CHEK2*, CTNNA1, DICER1, FANCC, FH, FLCN, GALNT12, KIF1B, LZTR1, MAX, MEN1, MET, MLH1*, MSH2*, MSH3, MSH6*, MUTYH*, NBN, NF1*, NF2, NTHL1, PALB2*, PHOX2B, PMS2*, POT1, PRKAR1A, PTCH1, PTEN*, RAD51C*, RAD51D*, RB1, RECQL, RET, SDHA, SDHAF2, SDHB, SDHC, SDHD, SMAD4, SMARCA4, SMARCB1, SMARCE1, STK11, SUFU, TMEM127, TP53*, TSC1, TSC2, VHL and XRCC2 (sequencing and deletion/duplication); EGFR, EGLN1, HOXB13, KIT, MITF, PDGFRA, POLD1, and POLE (sequencing only); EPCAM and GREM1 (deletion/duplication only). DNA and RNA analyses performed for * genes. The report date is Jan 16, 2021.   02/24/2021 Imaging   CT AP  IMPRESSION: 1. Interval resolution of previously seen small volume ascites throughout the abdomen and pelvis. There is redemonstrated, ill-defined stranding and thickening of the peritoneal surfaces and omentum, which is somewhat diminished compared to prior examination. 2. Interval improvement in right lower quadrant mesocolon lymph nodes. 3. Findings are consistent with treatment response of nodal and peritoneal metastatic disease. 4. Unchanged, matted appearing thickening of  the terminal ileum and  cecal base. 5. Sigmoid diverticulosis with wall thickening of the mid to distal sigmoid, similar to prior examination. Findings are most suggestive of chronic sequelae of diverticulitis.   Aortic Atherosclerosis (ICD10-I70.0).   03/13/2021 -  Chemotherapy   Patient is on Treatment Plan : COLORECTAL FOLFOXIRI + Bevacizumab q14d     05/13/2021 Procedure   OPERATIVE PROCEDURE: Laparoscopy and peritoneal biopsy.  SURGEON: Merlyn Albert. Clovis Riley, MD   05/13/2021 Pathology Results   Final Pathologic Diagnosis    A. PERITONEAL, BIOPSY :              Metastatic adenocarcinoma.               See comment.    Comment    The biopsies show predominately fibrosis  and fibroadipose tissue with a small focus of moderately differentiated adenocarcinoma. Given the patient's history, the findings favor metastasis of the patient's known colonic cancer.     07/01/2021 Imaging   CT CAP  IMPRESSION: Chest Impression:   1. No evidence of thoracic metastasis.   Abdomen / Pelvis Impression:   1. Thickening through the terminal ileum similar to prior. No ileocecal lymphadenopathy. 2. One focus linear thickening the peritoneum adjacent to loops of small bowel in the ventral abdomen which extend to the proximal sigmoid colon are more prominent than prior and concerning for residual peritoneal carcinoma. 3. No evidence of solid organ metastasis in the abdomen pelvis.      REVIEW OF SYSTEMS:   Review of Systems  Constitutional:  Positive for malaise/fatigue. Negative for chills and fever.  HENT: Negative.    Eyes: Negative.   Respiratory: Negative.    Cardiovascular: Negative.   Gastrointestinal:  Positive for abdominal pain, constipation, nausea and vomiting.  Genitourinary: Negative.   Musculoskeletal: Negative.   Skin: Negative.   Neurological: Negative.   Endo/Heme/Allergies: Negative.   Psychiatric/Behavioral: Negative.     I have reviewed the past medical history,  past surgical history, social history and family history with the patient and they are unchanged from previous note.   PHYSICAL EXAMINATION: ECOG PERFORMANCE STATUS: 2 - Symptomatic, <50% confined to bed  Vitals:   09/15/21 2214 09/16/21 0542  BP: (!) 149/90 (!) 148/90  Pulse: 83 88  Resp: (!) 22 18  Temp: (!) 97.5 F (36.4 C) 97.6 F (36.4 C)  SpO2: 97% 96%   Filed Weights   09/14/21 1058 09/14/21 1847  Weight: 117.8 kg 117.8 kg    Intake/Output from previous day: 01/02 0701 - 01/03 0700 In: 4588 [P.O.:260; I.V.:3828; IV Piggyback:500] Out: 4300 [Urine:600; Drains:3700]  Physical Exam Vitals reviewed.  Constitutional:      General: He is not in acute distress. HENT:     Head: Normocephalic.  Cardiovascular:     Rate and Rhythm: Normal rate and regular rhythm.  Pulmonary:     Effort: Pulmonary effort is normal.     Breath sounds: Normal breath sounds.  Abdominal:     General: Bowel sounds are decreased. There is distension.     Tenderness: There is generalized abdominal tenderness.  Skin:    General: Skin is warm and dry.  Neurological:     Mental Status: He is alert and oriented to person, place, and time.    LABORATORY DATA:  I have reviewed the data as listed CMP Latest Ref Rng & Units 09/16/2021 09/15/2021 09/14/2021  Glucose 70 - 99 mg/dL 95 161(H) 167(H)  BUN 6 - 20 mg/dL '20 18 19  ' Creatinine 0.61 - 1.24 mg/dL  0.81 0.89 0.83  Sodium 135 - 145 mmol/L 138 139 137  Potassium 3.5 - 5.1 mmol/L 4.1 4.4 3.8  Chloride 98 - 111 mmol/L 108 110 104  CO2 22 - 32 mmol/L '23 24 23  ' Calcium 8.9 - 10.3 mg/dL 7.8(L) 8.0(L) 8.2(L)  Total Protein 6.5 - 8.1 g/dL - 6.3(L) 7.1  Total Bilirubin 0.3 - 1.2 mg/dL - 0.6 0.5  Alkaline Phos 38 - 126 U/L - 88 105  AST 15 - 41 U/L - 20 23  ALT 0 - 44 U/L - 19 22    Lab Results  Component Value Date   WBC 14.2 (H) 09/15/2021   HGB 11.2 (L) 09/15/2021   HCT 35.1 (L) 09/15/2021   MCV 90.9 09/15/2021   PLT 280 09/15/2021    NEUTROABS 11.3 (H) 09/10/2021    Lab Results  Component Value Date   CEA1 1.9 11/29/2020   VEL381 4 11/30/2020    CT ABDOMEN PELVIS WO CONTRAST  Result Date: 08/27/2021 CLINICAL DATA:  Small-bowel obstruction with left lower quadrant pain. Leukocytosis also. Metastatic colon cancer. Ongoing chemotherapy. EXAM: CT ABDOMEN AND PELVIS WITHOUT CONTRAST TECHNIQUE: Multidetector CT imaging of the abdomen and pelvis was performed following the standard protocol without IV contrast. COMPARISON:  The recent CT with IV contrast 08/25/2021, CT abdomen and pelvis with IV and oral contrast 07/01/2021. FINDINGS: Lower chest: There is slight increased prominence of a few scattered epipericardial lymph nodes compared with 07/01/2021. No acute lung base findings. Hepatobiliary: No focal liver abnormality is seen. No gallstones, gallbladder wall thickening, or biliary dilatation. Pancreas: Unremarkable. No pancreatic ductal dilatation or surrounding inflammatory changes. Spleen: Unremarkable without contrast. Adrenals/Urinary Tract: No focal abnormality in the adrenal glands and renal cortex. No urinary stones or obstruction. Chronic perinephric stranding is similar. Normal bladder thickness. Stomach/Bowel: There is increasing dilatation of left upper to mid abdominal small bowel and anterior central small bowel up to 5 cm, although there is still relative decompressed appearance in the stomach and duodenum. The dilatation begins to be seen distal to the ligament of Treitz. The exact transition point could not be found but I suspect is probably related to abdominal wall adhesive disease with multiple small bowel segments closely abutting the anterior wall on multiple slices. The transitional segment is probably in the right mid to lower abdomen anteriorly. The appendix surgically absent. Rest of the small bowel is decompressed, some of it apparently having been removed. The large intestine wall is unremarkable except for  uncomplicated diverticula along the left segments. Vascular/Lymphatic: There is mild aortoiliac calcific plaque. No enlarged lymph nodes are seen. There is a stable subcentimeter to borderline sized ileocolic mesenteric lymph node in the right lower abdomen on axial 50. Reproductive: Normal prostate. Other: Diffuse haziness in omentum extends to the subphrenic space on the left. There is increased minimal ascites in the perihepatic space and right anterior abdominal mesenteric folds. There are small inguinal fat hernias. Musculoskeletal: Chronic L5 pars defects and grade 1 L5 spondylolisthesis. No destructive bone lesions. 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 IMPRESSION: 1. Intermediate to high-grade obstruction to approximately the mid small bowel, etiology is probably adhesive disease and the transition is most likely in the anterior right mid to lower abdomen along the abdominal wall. The exact transitional segment could not be located without contrast. 2. Slight increased prominence of a few epipericardial lymph nodes. Small stable ileocolic mesenteric node. 3. Diffuse omental haziness extending into the left subphrenic space concerning for metastatic disease, with small  volume of ascites. No free air. 4. Aortic atherosclerosis.  Additional findings as above. Electronically Signed   By: Telford Nab M.D.   On: 08/27/2021 04:19   X-ray chest PA and lateral  Result Date: 09/14/2021 CLINICAL DATA:  57 year old male with a history of colon cancer, recurrent SBO s/p venting gastrostomy tube, who presents to the emergency department today for evaluation of abdominal pain. Of note, patient recently admitted to the hospital for recurrent SBO and had a gastrostomy tube placed. He was actually discharged from the hospital yesterday. Since then he states he has been draining his PEG tube and he continues to have a significant amount of output so he has not drained in several hours. He is now  complaining of severe abdominal pain, distention, nausea vomiting. EXAM: CHEST - 2 VIEW COMPARISON:  01/04/2021. FINDINGS: Increased opacity at the left lung base compared to the prior exam, consistent with atelectasis, pneumonia or a combination. Remainder of the lungs is clear. Small left pleural effusion.  No pneumothorax. Cardiac silhouette normal in size. No mediastinal or hilar masses or evidence of adenopathy. Stable right internal jugular Port-A-Cath. Skeletal structures are grossly intact. IMPRESSION: 1. Left lower lung opacity consistent with a combination of atelectasis/pneumonia and a small effusion, new compared to the prior chest radiograph. Electronically Signed   By: Lajean Manes M.D.   On: 09/14/2021 10:59   Abd 1 View (KUB)  Result Date: 09/14/2021 CLINICAL DATA:  History of colon cancer with recurrent small bowel obstruction. Patient presents with abdominal pain. EXAM: ABDOMEN - 1 VIEW COMPARISON:  09/08/21 FINDINGS: Left upper quadrant gastrostomy tube identified. Since the previous exam there is been interval improvement in gaseous distension the of the small and large bowel loops. No new findings. IMPRESSION: Interval improvement in gaseous distension of the small and large bowel loops compared with 09/08/2021. Electronically Signed   By: Kerby Moors M.D.   On: 09/14/2021 10:57   DG Abd 1 View  Result Date: 09/08/2021 CLINICAL DATA:  Abdominal pain, nausea, vomiting EXAM: ABDOMEN - 1 VIEW COMPARISON:  09/07/2021 FINDINGS: Prominent small bowel loops centrally again noted, similar prior study compatible with persistent small bowel obstruction. No free air or organomegaly. Visualized lung bases clear. IMPRESSION: Continued small bowel obstruction pattern, not significantly changed. Electronically Signed   By: Rolm Baptise M.D.   On: 09/08/2021 15:12   DG Abd 1 View  Result Date: 09/07/2021 CLINICAL DATA:  Nausea and vomiting; EXAM: ABDOMEN - 1 VIEW COMPARISON:  September 04, 2021  ;September 03, 2021; September 02, 2021 FINDINGS: There are a few mildly dilated loops of bowel in the upper abdomen, improved in comparison to prior from September 03, 2021. Interval removal of enteric tube. Partial visualization of CVC tip terminating over the region of the superior cavoatrial junction. No bowel gas visualized in the rectum. IMPRESSION: Persistent mild dilation of several loops of small bowel in the upper abdomen, overall improved since September 03, 2021. This could reflect persistent small-bowel obstruction. Electronically Signed   By: Valentino Saxon M.D.   On: 09/07/2021 14:00   DG Abdomen 1 View  Result Date: 09/03/2021 CLINICAL DATA:  Nasogastric tube placement EXAM: ABDOMEN - 1 VIEW COMPARISON:  None. FINDINGS: Nasogastric tube tip overlies the expected mid body of the stomach. Multiple gas-filled dilated loops of small bowel are seen within the mid abdomen and left upper quadrant. No free intraperitoneal gas. IMPRESSION: Nasogastric tube tip within the mid body of the stomach. Electronically Signed   By:  Fidela Salisbury M.D.   On: 09/03/2021 00:51   CT ABDOMEN PELVIS W CONTRAST  Result Date: 09/02/2021 CLINICAL DATA:  Bowel obstruction suspected. Abdominal pain. Chemotherapy. Three weeks ago. EXAM: CT ABDOMEN AND PELVIS WITH CONTRAST TECHNIQUE: Multidetector CT imaging of the abdomen and pelvis was performed using the standard protocol following bolus administration of intravenous contrast. CONTRAST:  57m OMNIPAQUE IOHEXOL 350 MG/ML SOLN COMPARISON:  CT abdomen and pelvis 08/27/2021. FINDINGS: Lower chest: There is a new trace left pleural effusion. There is atelectasis in the bilateral lung bases. Hepatobiliary: No focal liver abnormality is seen. No gallstones, gallbladder wall thickening, or biliary dilatation. Pancreas: Unremarkable. No pancreatic ductal dilatation or surrounding inflammatory changes. Spleen: Normal in size without focal abnormality. Adrenals/Urinary Tract:  Adrenal glands are unremarkable. Kidneys are normal, without renal calculi, focal lesion, or hydronephrosis. Bladder is unremarkable. Stomach/Bowel: There are dilated mid and proximal small bowel loops with air-fluid levels measuring up to 5 cm. Degree of distention has mildly increased. There is also new moderate air-fluid level within the stomach. Transition point is likely in the mid abdomen where there are multiple small bowel loops demonstrating adhesive disease to the anterior abdominal wall. This finding is unchanged. The colon is nondilated. The appendix is not seen. There is no pneumatosis or free air identified. Vascular/Lymphatic: Aortic atherosclerosis. No enlarged abdominal or pelvic lymph nodes. Nonenlarged cardiophrenic and ileocecal lymph nodes are unchanged. Reproductive: Prostate is unremarkable. Other: There are small fat containing bilateral inguinal hernias. There is a small amount of free fluid in the anterior right abdomen which has mildly increased. There is mesenteric/omental haziness in the left upper quadrant and anterior upper abdomen similar to the prior examination. Musculoskeletal: There are bilateral pars interarticularis defects at L5 without significant listhesis. IMPRESSION: 1. Again seen are findings of small-bowel obstruction. Transition point is likely in the mid abdomen where there are clustered small bowel loops adhered to the anterior abdominal wall. Can not exclude underlying obstructive lesion. Degree of distention has increased when compared to the prior exam. There is now air-fluid level in the stomach. 2. Increasing free fluid in the right abdomen. 3. New small left pleural effusion. 4. Stable diffuse omental haziness is unchanged, concerning for metastatic disease. Electronically Signed   By: ARonney AstersM.D.   On: 09/02/2021 20:30   CT ABDOMEN PELVIS W CONTRAST  Result Date: 08/25/2021 CLINICAL DATA:  56year old male with history of nausea and vomiting.  Suspected bowel obstruction. EXAM: CT ABDOMEN AND PELVIS WITH CONTRAST TECHNIQUE: Multidetector CT imaging of the abdomen and pelvis was performed using the standard protocol following bolus administration of intravenous contrast. CONTRAST:  864mOMNIPAQUE IOHEXOL 350 MG/ML SOLN COMPARISON:  CT of the abdomen and pelvis 07/01/2021. FINDINGS: Lower chest: Tip of central venous catheter terminating at the superior cavoatrial junction. Mild scarring in the posterior aspect of the left upper lobe. Hepatobiliary: No definite suspicious cystic or solid hepatic lesions. No intra or extrahepatic biliary ductal dilatation. Gallbladder is normal in appearance. Pancreas: No pancreatic mass. No pancreatic ductal dilatation. No pancreatic or peripancreatic fluid collections or inflammatory changes. Spleen: Unremarkable. Adrenals/Urinary Tract: Bilateral kidneys and adrenal glands are normal in appearance. No hydroureteronephrosis. Urinary bladder is normal in appearance. Stomach/Bowel: The appearance of the stomach is normal. No pathologic dilatation of small bowel or colon. Status post appendectomy. Mass-like soft tissue thickening in the region of the terminal ileum best appreciated on axial images 52 and 53 of series 2. Vascular/Lymphatic: Aortic atherosclerosis. No lymphadenopathy noted in the  abdomen or pelvis. Prominent but nonenlarged ileocolic lymph node in the right lower quadrant measuring 6 mm in short axis (axial image 50 of series 2), stable compared to the prior study, nonspecific. Reproductive: Prostate gland and seminal vesicles are unremarkable in appearance. Other: There continues to be some mild diffuse haziness throughout the omentum. Scattered volume of trace ascites is noted, most evident in the right-side of the abdomen (axial image 43 of series 2). No large peritoneal soft tissue masses are confidently identified. No pneumoperitoneum. Musculoskeletal: There are no aggressive appearing lytic or blastic  lesions noted in the visualized portions of the skeleton. IMPRESSION: 1. No findings to suggest bowel obstruction. 2. However, there are findings suggestive of intraperitoneal metastatic disease, including residual small volume of ascites and diffuse haziness throughout the omentum, which has increased slightly compared to the prior examination. 3. Aortic atherosclerosis. 4. Additional incidental findings, as above. Electronically Signed   By: Vinnie Langton M.D.   On: 08/25/2021 07:36   IR GASTROSTOMY TUBE MOD SED  Result Date: 09/11/2021 CLINICAL DATA:  History of metastatic colon carcinoma with persistent small-bowel obstruction and request to place a venting gastrostomy tube for symptomatic relief. EXAM: PERCUTANEOUS GASTROSTOMY TUBE PLACEMENT ANESTHESIA/SEDATION: Moderate (conscious) sedation was employed during this procedure. A total of Versed 4.0 mg and Fentanyl 100 mcg was administered intravenously by radiology nursing. Moderate Sedation Time: 15 minutes. The patient's level of consciousness and vital signs were monitored continuously by radiology nursing throughout the procedure under my direct supervision. CONTRAST:  34m OMNIPAQUE IOHEXOL 300 MG/ML  SOLN MEDICATIONS: 2 g IV Ancef. IV antibiotic was administered in an appropriate time interval prior to needle puncture of the skin. During the procedure the patient received 0.5 mg IV glucagon. FLUOROSCOPY TIME:  2 minutes and 24 seconds.  51.0 mGy. PROCEDURE: The procedure, risks, benefits, and alternatives were explained to the patient. Questions regarding the procedure were encouraged and answered. The patient understands and consents to the procedure. A time-out was performed prior to initiating the procedure. A 5-French catheter was advanced through the patient's mouth under fluoroscopy into the esophagus and to the level of the stomach. This catheter was used to insufflate the stomach with air under fluoroscopy. The abdominal wall was prepped  with chlorhexidine in a sterile fashion, and a sterile drape was applied covering the operative field. A sterile gown and sterile gloves were used for the procedure. Local anesthesia was provided with 1% Lidocaine. A skin incision was made in the upper abdominal wall. Under fluoroscopy, an 18 gauge trocar needle was advanced into the stomach. Contrast injection was performed to confirm intraluminal position of the needle tip. A single T tack was then deployed in the lumen of the stomach. This was brought up to tension at the skin surface. Over a guidewire, a 9-French sheath was advanced into the lumen of the stomach. The wire was left in place as a safety wire. A loop snare device from a percutaneous gastrostomy kit was then advanced into the stomach. A floppy guide wire was advanced through the orogastric catheter under fluoroscopy in the stomach. The loop snare advanced through the percutaneous gastric access was used to snare the guide wire. This allowed withdrawal of the loop snare out of the patient's mouth by retraction of the orogastric catheter and wire. A 20-French bumper retention gastrostomy tube was looped around the snare device. It was then pulled back through the patient's mouth. The retention bumper was brought up to the anterior gastric wall. The T  tack suture was cut at the skin. The exiting gastrostomy tube was cut to appropriate length and a feeding adapter applied. The catheter was injected with contrast material to confirm position and a fluoroscopic spot image saved. The tube was then flushed with saline. A dressing was applied over the gastrostomy exit site. COMPLICATIONS: None. FINDINGS: The stomach distended well with air allowing safe placement of the gastrostomy tube. After placement, the tip of the gastrostomy tube lies in the body of the stomach. IMPRESSION: Percutaneous gastrostomy with placement of a 20-French bumper retention tube in the body of the stomach. Electronically Signed    By: Aletta Edouard M.D.   On: 09/11/2021 16:11   DG ABD ACUTE 2+V W 1V CHEST  Result Date: 08/27/2021 CLINICAL DATA:  Metastatic colon cancer. Abdominal pain left lower quadrant with vomiting. EXAM: DG ABDOMEN ACUTE WITH 1 VIEW CHEST COMPARISON:  CT with IV contrast 08/25/2021 FINDINGS: There is increased dilatation of left mid to lower abdominal small bowel up to 5 cm concerning for small bowel obstruction. Small amount of scattered gas and stool remain present in the colon. There are stable visceral shadows. There is no evidence of free air. No pathologic calcifications. The lungs are generally clear aside from perihilar linear atelectatic changes on the left. No pleural effusion is seen. The cardiac size is normal. Right IJ port catheter tip remains at the cavoatrial junction. IMPRESSION: Increased dilatation of the left abdominal small bowel up to 5 cm concerning for small bowel obstruction. In all other respects no further changes. Electronically Signed   By: Telford Nab M.D.   On: 08/27/2021 02:38   DG Abd Portable 1V-Small Bowel Obstruction Protocol-24 hr delay  Result Date: 09/04/2021 CLINICAL DATA:  Small-bowel obstruction. Bowel obstruction protocol/24 delay image. EXAM: PORTABLE ABDOMEN - 1 VIEW COMPARISON:  Radiographs 09/03/2021 and 08/27/2021.  CT 09/02/2021. FINDINGS: 1120 hours. Two views obtained. Tip of the nasogastric tube projects over the distal stomach. The enteric contrast has passed into the colon which appears decompressed. Previously noted small bowel distension is improved. No extraluminal contrast or air collections are identified. Telemetry leads overlie the chest and upper abdomen. IMPRESSION: Antegrade passage of contrast into decompressed colon with improved small bowel dilatation. No evidence of bowel obstruction or perforation. Electronically Signed   By: Richardean Sale M.D.   On: 09/04/2021 13:43   DG Abd Portable 1V-Small Bowel Obstruction Protocol-initial, 8 hr  delay  Result Date: 09/03/2021 CLINICAL DATA:  Small-bowel obstruction. EXAM: PORTABLE ABDOMEN - 1 VIEW COMPARISON:  09/03/2021 FINDINGS: Nasogastric tube overlies expected mid body of the stomach. Contrast is seen within the gastric fundus. There has been little antegrade passage of contrast into the remainder of the abdomen. Multiple loops of gas-filled dilated small bowel are seen within the epigastrium and left abdomen in keeping with changes of a mid small bowel obstruction. No gross free intraperitoneal gas. No organomegaly. IMPRESSION: No significant antegrade passage of contrast from the gastric lumen. Persistent findings in keeping with a mid small bowel obstruction. Electronically Signed   By: Fidela Salisbury M.D.   On: 09/03/2021 19:48   DG Abd Portable 1V-Small Bowel Obstruction Protocol-initial, 8 hr delay  Result Date: 08/27/2021 CLINICAL DATA:  Small bowel obstruction.  History of colon cancer. EXAM: PORTABLE ABDOMEN - 1 VIEW COMPARISON:  Same day. FINDINGS: Residual contrast is seen through the nondilated colon. Mildly dilated small bowel loops are noted in the upper abdomen concerning for ileus or distal small bowel obstruction. IMPRESSION: Mildly dilated  small bowel loops are again noted in upper abdomen concerning for distal small bowel obstruction. Residual contrast is noted in nondilated colon. Electronically Signed   By: Marijo Conception M.D.   On: 08/27/2021 21:41     Future Appointments  Date Time Provider [place]  09/18/2021 11:00 AM [facility] None  09/18/2021 11:40 AM [name], MD [facility] None  09/18/2021 12:00 PM [facility] PALLIATIVE CARE [facility] None  09/18/2021 12:30 PM [facility] INFUSION [facility] None  09/20/2021 12:45 PM [facility] None      LOS: 1 day   Mikey Bussing, DNP, AGPCNP-BC, AOCNP 09/16/21   Addendum I have seen the patient, examined him. I agree with the assessment and and plan and have edited  the notes.   Ricardo [name] has developed worsening abdominal pain after recent hospital discharge.  This is likely related to the peritoneal metastasis and a bowel obstruction.  He is tolerating TPN well, venting PEG is working fine.  I talked with palliative care Dr. Rowe Pavy and NP [name] about his pain control, appreciate their input. If her pain is better controlled, I will tentatively start him back on chemo FOLFOXFIRI later this week, with dose reduction and more premeds due to his previous allergy reaction to oxaliplatin.  Unfortunately, this is very difficult situation to treat, patient is not ready for hospice although he is clearly a candidate for hospice.  We discussed CODE STATUS again, he is partial code, no intubation. I will f/u tomorrow, and may move him to [place] if we do chemo.   [name]  09/16/2021

## 2021-09-16 NOTE — Plan of Care (Signed)
  Problem: Pain Managment: Goal: General experience of comfort will improve Outcome: Progressing   Problem: Activity: Goal: Risk for activity intolerance will decrease Outcome: Progressing   

## 2021-09-16 NOTE — Progress Notes (Signed)
PROGRESS NOTE    Ricardo [name]  DDU:[id number] DOB: [birth year] DOA: 09/14/2021 PCP: [name], MD   Chief Complaint  Patient presents with   Abdominal Pain    Brief Narrative/Hospital Course: Ricardo [name], 56 y.o. 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 In the ED afebrile mildly tachycardic, labs with leukocytosis likely poor, stable hemoglobin and renal function Lumin low 2.7 Abdominal x-ray showed interval improvement in gaseous distention of the small and large bowel loops compared with 12/26 X-ray showed left lower lung opacity concern about aspiration placed on antibiotics   Subjective: Seen and examined. About to go to the bathroom to have a bowel movement so far only passing flatus abdomen remains distended.  Last night had "meltdown" needing Xanax PER rn. Was not hopeful after discussion with Dr. Burr Medico this morning on the phone No Fever overnight. Still having ongoing gastrostomy output 3700 ml/ past 24hrs  Assessment & Plan:  Intractable abdominal pain with nausea and vomiting Recent admission for SBO, status post PEG placement for venting: Patient symptom preceded by constipation and suspect presentation in the setting of his recent PEG placement for venting.  Although imaging studies showed improvement in the gaseous distention-continues to have significant PEG output, remains n.p.o./ice chips, on TPN.continue pain control antiemetics supportive care.  Dr.  Burr Medico to see the patient -adjust pain medication added Xanax   Aspiration pneumonia: Likely from vomiting.  Afebrile, with leukocytosis.  Continue Unasyn.  Monitor CBC.  Has had persistent leukocytosis since November ~ 12-19k.  Colon cancer with mets to multiple sites, overall poor prognosis has a follow-up with Dr. Morey Hummingbird on 1/5. He responded well to cisplatin in the past but had allergic reaction, plan was to see if he is he is able to tolerate cisplatin therapy with premedication.  Oncology will see him today.  Anxiety increase Xanax to 3 times daily as needed  GERD continue PPI GAD: prn meds HLD-not on statin. Moderate protein malnutrition/increased nutrient need continue TPN Normocytic anemia from anemia of malignancy coronary disease monitor Hyperglycemia monitor  Class II Obesity:Patient's Body mass index is 36.22 kg/m. : Will benefit with PCP follow-up, weight loss  healthy lifestyle and outpatient sleep evaluation.   DVT prophylaxis: enoxaparin (LOVENOX) injection 40 mg Start: 09/16/21 1000 SCDs Start: 09/14/21 0952.  Start Lovenox Code Status:   Code Status: Partial Code Family Communication: plan of care discussed with patient at bedside. Status is: Admitted  as observation Remains hospitalized for ongoing management of intra-abdominal pain nausea vomiting  Disposition: Currently not medically stable for discharge. Anticipated Disposition: TBD  Objective: Vitals last 24 hrs: Vitals:   09/15/21 1843 09/15/21 2214 09/16/21 0542 09/16/21 1008  BP: (!) 149/90 (!) 149/90 (!) 148/90 (!) 146/96  Pulse: 79 83 88 99  Resp: 18 (!) 22 18 18   Temp: (!) 97.5 F (36.4 C) (!) 97.5 F (36.4 C) 97.6 F (36.4 C) 99.1 F (37.3 C)  TempSrc: Oral Oral Oral Oral  SpO2: 97% 97% 96% 94%  Weight:      Height:  Weight change:   Intake/Output Summary (Last 24 hours) at 09/16/2021 1041 Last data filed at 09/16/2021 2505 Gross per 24 hour  Intake 3916.97 ml  Output 3900 ml  Net 16.97  ml   Net IO Since Admission: 3,093.29 mL [09/16/21 1041]   Physical Examination: General exam: AAOx 3, appears mildly depressed pleasant, obese  HEENT:Oral mucosa moist, Ear/Nose WNL grossly, dentition normal. Respiratory system: bilaterally diminished,no use of accessory muscle Cardiovascular system: S1 & S2 +, No JVD,. Gastrointestinal system: Abdomen soft, distended, PEG+. BS sluggish  Nervous System:Alert, awake, moving extremities and grossly nonfocal Extremities: NO edema, distal peripheral pulses palpable.  Skin: No rashes,no icterus. MSK: Normal muscle bulk,tone, power   Medications reviewed:  Scheduled Meds:  Chlorhexidine Gluconate Cloth  6 each Topical Daily   enoxaparin (LOVENOX) injection  40 mg Subcutaneous Q24H   insulin aspart  0-9 Units Subcutaneous 4 times per day   pantoprazole (PROTONIX) IV  40 mg Intravenous Q24H   sodium chloride flush  10-40 mL Intracatheter Q12H   Continuous Infusions:  sodium chloride 50 mL/hr at 09/16/21 0304   ampicillin-sulbactam (UNASYN) IV 3 g (09/16/21 0543)   promethazine (PHENERGAN) injection (IM or IVPB) 25 mg (39/76/73 4193)   TPN CYCLIC-ADULT (ION)     Diet Order             Diet NPO time specified Except for: Ice Chips, Sips with Meds  Diet effective now                 Weight change:   Wt Readings from Last 3 Encounters:  09/14/21 117.8 kg  09/13/21 119.8 kg  08/27/21 118 kg  Consultants:see note  Procedures:see note Antimicrobials: Anti-infectives (From admission, onward)    Start     Dose/Rate Route Frequency Ordered Stop   09/14/21 1230  Ampicillin-Sulbactam (UNASYN) 3 g in sodium chloride 0.9 % 100 mL IVPB        3 g 200 mL/hr over 30 Minutes Intravenous Every 6 hours 09/14/21 1133        Culture/Microbiology    Component Value Date/Time   SDES STOOL 01/20/2009 2030   SDES STOOL 01/20/2009 2030   SDES STOOL 01/20/2009 2030   [place] NONE 01/20/2009 2030   [place] NONE 01/20/2009 2030    [place] 01/20/2009 2030   CULT  01/20/2009 2030    NO SALMONELLA, SHIGELLA, CAMPYLOBACTER, OR YERSINIA ISOLATED   REPTSTATUS 01/21/2009 FINAL 01/20/2009 2030   REPTSTATUS 01/25/2009 FINAL 01/20/2009 2030   REPTSTATUS 01/23/2009 FINAL 01/20/2009 2030    Other culture-see note  Unresulted Labs (From admission, onward)     Start     Ordered   09/17/21 0500  CBC  Tomorrow morning,   R       Question:  Specimen collection method  Answer:  IV Team=IV Team collect   09/16/21 0735   09/15/21 2200  Strep pneumoniae urinary antigen  Once,   R        09/15/21 2200   09/15/21 0500  Comprehensive metabolic panel  (TPN Lab Panel)  Every Mon,Thu (0500),   R      09/14/21 1106   09/15/21 0500  Magnesium  (TPN Lab Panel)  Every Mon,Thu (0500),   R      09/14/21 1106   09/15/21 0500  Phosphorus  (TPN Lab Panel)  Every Mon,Thu (0500),   R      09/14/21 1106   09/15/21 0500  Triglycerides  (TPN Lab Panel)  Every Monday (0500),  R      09/14/21 1106           Data Reviewed: I have personally reviewed following labs and imaging studies CBC: Recent Labs  Lab 09/10/21 0511 09/14/21 0522 09/15/21 0331  WBC 13.7* 14.3* 14.2*  NEUTROABS 11.3*  --   --   HGB 12.0* 12.5* 11.2*  HCT 36.3* 37.9* 35.1*  MCV 88.8 88.8 90.9  PLT 285 332 678   Basic Metabolic Panel: Recent Labs  Lab 09/12/21 0537 09/13/21 0609 09/14/21 0522 09/14/21 0613 09/15/21 0331 09/16/21 0618  NA 134* 134* 137  --  139 138  K 4.1 3.9 3.8  --  4.4 4.1  CL 105 104 104  --  110 108  CO2 22 23 23   --  24 23  GLUCOSE 133* 138* 167*  --  161* 95  BUN 20 21* 19  --  18 20  CREATININE 0.89 0.83 0.83  --  0.89 0.81  CALCIUM 8.0* 7.8* 8.2*  --  8.0* 7.8*  MG 2.1 2.1  --  2.1 2.1 2.0  PHOS 3.1 3.2  --  2.8 2.7 4.0   GFR: Estimated Creatinine Clearance: 132.9 mL/min (by C-G formula based on SCr of 0.81 mg/dL). Liver Function Tests: Recent Labs  Lab 09/10/21 0511 09/11/21 0642 09/14/21 0522 09/15/21 0331   AST 18 16 23 20   ALT 26 23 22 19   ALKPHOS 108 107 105 88  BILITOT 0.6 0.4 0.5 0.6  PROT 6.9 7.0 7.1 6.3*  ALBUMIN 2.8* 2.8* 2.7* 2.5*   Recent Labs  Lab 09/14/21 0522  LIPASE 37   No results for input(s): AMMONIA in the last 168 hours. Coagulation Profile: Recent Labs  Lab 09/10/21 0511  INR 1.2   Cardiac Enzymes: No results for input(s): CKTOTAL, CKMB, CKMBINDEX, TROPONINI in the last 168 hours. BNP (last 3 results) No results for input(s): PROBNP in the last 8760 hours. HbA1C: Recent Labs    09/14/21 1136  HGBA1C 5.5   CBG: Recent Labs  Lab 09/15/21 0642 09/15/21 1206 09/15/21 1952 09/15/21 2259 09/16/21 0703  GLUCAP 127* 86 127* 107* 91   Lipid Profile: Recent Labs    09/15/21 0331  TRIG 68   Thyroid Function Tests: No results for input(s): TSH, T4TOTAL, FREET4, T3FREE, THYROIDAB in the last 72 hours. Anemia Panel: No results for input(s): VITAMINB12, FOLATE, FERRITIN, TIBC, IRON, RETICCTPCT in the last 72 hours. Sepsis Labs: No results for input(s): PROCALCITON, LATICACIDVEN in the last 168 hours.  Recent Results (from the past 240 hour(s))  Resp Panel by RT-PCR (Flu A&B, Covid) Nasopharyngeal Swab     Status: None   Collection Time: 09/14/21  7:46 AM   Specimen: Nasopharyngeal Swab; Nasopharyngeal(NP) swabs in vial transport medium  Result Value Ref Range Status   SARS Coronavirus 2 by RT PCR NEGATIVE NEGATIVE Final    Comment: (NOTE) SARS-CoV-2 target nucleic acids are NOT DETECTED.  The SARS-CoV-2 RNA is generally detectable in upper respiratory specimens during the acute phase of infection. The lowest concentration of SARS-CoV-2 viral copies this assay can detect is 138 copies/mL. A negative result does not preclude SARS-Cov-2 infection and should not be used as the sole basis for treatment or other patient management decisions. A negative result may occur with  improper specimen collection/handling, submission of specimen other than  nasopharyngeal swab, presence of viral mutation(s) within the areas targeted by this assay, and inadequate number of viral copies(<138 copies/mL). A negative result must be combined with clinical observations, patient  history, and epidemiological information. The expected result is Negative.  Fact Sheet for Patients:  EntrepreneurPulse.com.au  Fact Sheet for Healthcare Providers:  IncredibleEmployment.be  This test is no t yet approved or cleared by the [place] FDA and  has been authorized for detection and/or diagnosis of SARS-CoV-2 by FDA under an Emergency Use Authorization (EUA). This EUA will remain  in effect (meaning this test can be used) for the duration of the COVID-19 declaration under Section 564(b)(1) of the Act, 21 U.S.C.section 360bbb-3(b)(1), unless the authorization is terminated  or revoked sooner.       Influenza A by PCR NEGATIVE NEGATIVE Final   Influenza B by PCR NEGATIVE NEGATIVE Final    Comment: (NOTE) The Xpert Xpress SARS-CoV-2/FLU/RSV plus assay is intended as an aid in the diagnosis of influenza from Nasopharyngeal swab specimens and should not be used as a sole basis for treatment. Nasal washings and aspirates are unacceptable for Xpert Xpress SARS-CoV-2/FLU/RSV testing.  Fact Sheet for Patients: EntrepreneurPulse.com.au  Fact Sheet for Healthcare Providers: IncredibleEmployment.be  This test is not yet approved or cleared by the [place] FDA and has been authorized for detection and/or diagnosis of SARS-CoV-2 by FDA under an Emergency Use Authorization (EUA). This EUA will remain in effect (meaning this test can be used) for the duration of the COVID-19 declaration under Section 564(b)(1) of the Act, 21 U.S.C. section 360bbb-3(b)(1), unless the authorization is terminated or revoked.  Performed at [facility], [place] [address] [place], [place] [address]      Radiology Studies: No results found.   LOS: 1 day   Antonieta Pert, MD [facility]  09/16/2021, 10:41 AM

## 2021-09-16 NOTE — TOC Initial Note (Addendum)
Transition of Care [facility]) - Initial/Assessment Note   Patient Details  Name: Ricardo [name] MRN: [id number] Date of Birth: 05-30-1966  Transition of Care [facility]) CM/SW Contact:    Sherie Don, LCSW Phone Number: 09/16/2021, 3:19 PM  Clinical Narrative: CSW met with patient and his wife to complete assessment. Per wife, they were not able to handle the TPN at home due to poor pain control, so the patient returned to the hospital. Now that patient has completed the TPN, the patient will not need HH through [facility] and TPN from [place]. Wife and patient expressed concerns about returning home unless his pain is better controlled. Patient reported he thought palliative may have referred him to OP palliative, but is unsure. Patient reported he does not know if he will need any DME at discharge. TOC to follow.  Addendum: Per hospitalist and pharmacist, patient will need to discharge home on TPN so this will be resumed at discharge. Patient aware.  Expected Discharge Plan: Home/Self Care Barriers to Discharge: Continued Medical Work up  Patient Goals and CMS Choice Patient states their goals for this hospitalization and ongoing recovery are:: Return home, possibly with palliative care Choice offered to / list presented to : NA  Expected Discharge Plan and Services Expected Discharge Plan: Home/Self Care In-house Referral: Clinical Social Work Living arrangements for the past 2 months: Single Family Home           DME Arranged: N/A DME Agency: NA  Prior Living Arrangements/Services Living arrangements for the past 2 months: Single Family Home Lives with:: Spouse Patient language and need for interpreter reviewed:: Yes Do you feel safe going back to the place where you live?: Yes      Need for Family Participation in Patient Care: Yes (Comment) Care giver support system in place?: Yes (comment) Criminal Activity/Legal Involvement Pertinent to Current Situation/Hospitalization: No - Comment as  needed  Activities of Daily Living Home Assistive Devices/Equipment: None ADL Screening (condition at time of admission) Patient's cognitive ability adequate to safely complete daily activities?: Yes Is the patient deaf or have difficulty hearing?: No Does the patient have difficulty seeing, even when wearing glasses/contacts?: No Does the patient have difficulty concentrating, remembering, or making decisions?: No Patient able to express need for assistance with ADLs?: Yes Does the patient have difficulty dressing or bathing?: No Independently performs ADLs?: Yes (appropriate for developmental age) Does the patient have difficulty walking or climbing stairs?: No Weakness of Legs: None Weakness of Arms/Hands: None  Emotional Assessment Appearance:: Appears stated age Attitude/Demeanor/Rapport: Engaged Affect (typically observed): Other (comment) (In pain) Orientation: : Oriented to Self, Oriented to Place, Oriented to  Time, Oriented to Situation Alcohol / Substance Use: Not Applicable Psych Involvement: No (comment)  Admission diagnosis:  Vomiting [R11.10] Intractable abdominal pain [R10.9] Abdominal pain, unspecified abdominal location [R10.9] Patient Active Problem List   Diagnosis Date Noted   Class 2 obesity 09/14/2021   Moderate protein malnutrition ([place]) 09/14/2021   Normocytic anemia 09/14/2021   Hyperglycemia 09/14/2021   Colon cancer metastasized to multiple sites ([place]) 09/06/2021   Pleural effusion, left 09/06/2021   GERD (gastroesophageal reflux disease) 09/03/2021   GAD (generalized anxiety disorder) 09/03/2021   History of COVID-19 04/30/2021   Genetic testing 01/17/2021   Dehydration 01/04/2021   Abdominal pain 01/04/2021   Nausea & vomiting 01/04/2021   High anion gap metabolic acidosis 21/11/1279   Leukocytosis 01/04/2021   AKI (acute kidney injury) ([place]) 01/03/2021   Port-A-Cath in place 01/02/2021  Family history of breast cancer    Family history of  prostate cancer    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[place]) 12/04/2020   SBO (small bowel obstruction) ([place]) 11/27/2020   Ileitis 11/27/2020   Intractable abdominal pain 11/27/2020   Nausea and vomiting 11/27/2020   Hyperlipidemia 11/27/2020   Lymphadenopathy, abdominal 11/27/2020   Obesity (BMI 30-39.9) 11/10/2018   Arthralgia 11/10/2018   Sleep disorder 11/10/2018   PCP:  [name], MD Pharmacy:   [id number] [place] [id number]: [phone]: [phone] Readmission Risk Interventions Readmission Risk Prevention Plan 09/16/2021 09/06/2021 08/28/2021  Transportation Screening - - Complete  HRI or [place]- Complete Complete  Social Work Consult for [place]Counsel[place] - Complete Complete  Palliative Care Screening - Not Applicable Not Applicable  HRI or Home Care Consult Complete - -  SW Recovery Care/Counseling Consult Complete - -  Palliative Care Screening Complete - -  [place] - -  Some recent data might be hidden

## 2021-09-17 DIAGNOSIS — R109 Unspecified abdominal pain: Secondary | ICD-10-CM | POA: Diagnosis not present

## 2021-09-17 DIAGNOSIS — R112 Nausea with vomiting, unspecified: Secondary | ICD-10-CM

## 2021-09-17 DIAGNOSIS — E785 Hyperlipidemia, unspecified: Secondary | ICD-10-CM

## 2021-09-17 DIAGNOSIS — C772 Secondary and unspecified malignant neoplasm of intra-abdominal lymph nodes: Secondary | ICD-10-CM

## 2021-09-17 DIAGNOSIS — C182 Malignant neoplasm of ascending colon: Secondary | ICD-10-CM

## 2021-09-17 DIAGNOSIS — R739 Hyperglycemia, unspecified: Secondary | ICD-10-CM

## 2021-09-17 LAB — CBC
HCT: 34.2 % — ABNORMAL LOW (ref 39.0–52.0)
Hemoglobin: 11.1 g/dL — ABNORMAL LOW (ref 13.0–17.0)
MCH: 29.4 pg (ref 26.0–34.0)
MCHC: 32.5 g/dL (ref 30.0–36.0)
MCV: 90.5 fL (ref 80.0–100.0)
Platelets: 277 10*3/uL (ref 150–400)
RBC: 3.78 MIL/uL — ABNORMAL LOW (ref 4.22–5.81)
RDW: 15.1 % (ref 11.5–15.5)
WBC: 12.6 10*3/uL — ABNORMAL HIGH (ref 4.0–10.5)
nRBC: 0 % (ref 0.0–0.2)

## 2021-09-17 LAB — GLUCOSE, CAPILLARY
Glucose-Capillary: 104 mg/dL — ABNORMAL HIGH (ref 70–99)
Glucose-Capillary: 97 mg/dL (ref 70–99)
Glucose-Capillary: 93 mg/dL (ref 70–99)

## 2021-09-17 LAB — STREP PNEUMONIAE URINARY ANTIGEN: Strep Pneumo Urinary Antigen: NEGATIVE

## 2021-09-17 MED ORDER — SENNOSIDES-DOCUSATE SODIUM 8.6-50 MG PO TABS
2.0000 | ORAL_TABLET | Freq: Every day | ORAL | Status: DC
  Administered 2021-09-18 – 2021-09-23 (×6): 2 via ORAL
  Filled 2021-09-17 (×6): qty 2

## 2021-09-17 MED ORDER — DEXTROSE 10 % IV SOLN: INTRAVENOUS | Status: DC

## 2021-09-17 MED ORDER — HYDRALAZINE HCL 25 MG PO TABS: 25.0000 mg | ORAL_TABLET | Freq: Four times a day (QID) | ORAL | Status: DC | PRN

## 2021-09-17 MED ORDER — HYDROMORPHONE HCL 2 MG PO TABS
4.0000 mg | ORAL_TABLET | ORAL | Status: DC | PRN
  Administered 2021-09-18 (×2): 4 mg via ORAL
  Filled 2021-09-17 (×3): qty 2

## 2021-09-17 MED ORDER — HYDROMORPHONE HCL 1 MG/ML IJ SOLN
1.0000 mg | INTRAMUSCULAR | Status: DC | PRN
  Administered 2021-09-17 – 2021-09-18 (×4): 1 mg via INTRAVENOUS
  Filled 2021-09-17 (×4): qty 1

## 2021-09-17 MED ORDER — TRAVASOL 10 % IV SOLN
INTRAVENOUS | Status: AC
  Filled 2021-09-17: qty 1254

## 2021-09-17 NOTE — Progress Notes (Signed)
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  He was recently hospitalized and discharged on 09/13/21 with home TPN.  Pharmacy managed his TPN during that admission.  He presented back to the ED on 09/14/21 with c/o abdominal pain and N/V.  Pharmacy has been consulted to manage pt's TPN.  - pt was on cyclic (12 hr) TPN PTA. Patient's wife stated that last TPN bag hung on 09/13/21 at 7:30p and this was stopped on 09/14/21 at 7:30a  Glucose / Insulin: No hx DM. Hgb A1c 5.5% -CBG goal <180; CBG range: 91-133. 1 units SSI/24 hrs. -Solu-Medrol 40 mg IV x1 dose given 1/1, dexamethasone 4 mg PO daily started 1/3 Electrolytes: All lytes WNL, including CorrCa (9) on 1/3 Renal: SCr <1; stable. BUN WNL Hepatic: LFTs WNL - albumin low Intake / Output; MIVF: No MIVF. I/O Net: +3438 mL, suspect strict I/O not charted -UOP: 300 mL; Drain output: None charted yesterday, but confirmed with RN that G-tube is venting; stool: none GI Imaging: - 09/02/21 abd CT:  findings consistent with small-bowel obstruction, free fluid rt abdomen, small l pleural effusion, stable metastatic disease - 12/26 KUB: continued SBO pattern GI Surgeries / Procedures:  - 5/20 lap appy - 3/22 ex lap  w/ partial bowel resection and ostomy creation - 8/22 diagnostic laprascopy w/ peritoneal biopsies - 12/29: perc gastrostomy  Central access: implanted port 12/12/20 TPN start date: 09/04/21 --> resumed on adm on 09/14/21  Nutritional Goals: Goal TPN 2359mL/24hr (provides 125 g of protein and 2234 kcals per day)  RD Assessment (on 12/29)  : Estimated Needs Total Energy Estimated Needs: 2200-2400 kcal Total Protein Estimated Needs: 115-125 grams Total Fluid Estimated Needs: >/= 2.4 L/day  Current Nutrition:  TPN NPO except for ice chips, sips with meds  Plan:  At 1800: Continue TPN cyclic rate over 12 hr, from 6pm to 6am Electrolytes in TPN: No changes Na 110 mEq/L K 35 mEq/L Ca 60mEq/L Mg 36mEq/L Phos 71mmol/L Cl:Ac 1:1 Add standard MVI and trace elements to TPN Sensitive SSI at 8p, 11p, 7a, noon. Continue CBG checks and SSI for now since steroids initiated MD managing IVF TPN labs Mon, Thurs  Lenis Noon, PharmD 09/17/2021,7:28 AM

## 2021-09-17 NOTE — Progress Notes (Signed)
Ricardo [name]   DOB:01/21/1966   IZ#:[id number]   [phone]  Oncology follow up   Subjective: Ricardo [name] is doing better today, pain is controlled, he had a very small BM yesterday, mild nausea, no vomiting today.   Objective:  Vitals:   09/17/21 1356 09/17/21 1435  BP: (!) 144/94 (!) 138/99  Pulse: 82 85  Resp: 16 18  Temp: 98 F (36.7 C) (!) 97.2 F (36.2 C)  SpO2: 96% 98%    Body mass index is 36.22 kg/m.  Intake/Output Summary (Last 24 hours) at 09/17/2021 1755 Last data filed at 09/17/2021 0612 Gross per 24 hour  Intake 3738.13 ml  Output 300 ml  Net 3438.13 ml     Sclerae unicteric  Oropharynx clear  No peripheral adenopathy  Lungs clear -- no rales or rhonchi  Heart regular rate and rhythm  Abdomen soft, mild diffuse tenderness  MSK no focal spinal tenderness, no peripheral edema  Neuro nonfocal    CBG (last 3)  Recent Labs    09/16/21 2316 09/17/21 0622 09/17/21 1203  GLUCAP 110* 104* 97     Labs:  Urine Studies No results for input(s): UHGB, CRYS in the last 72 hours.  Invalid input(s): UACOL, UAPR, USPG, UPH, UTP, UGL, [place], UBIL, UNIT, UROB, [place], UEPI, UWBC, Duwayne Heck [place], [place]  Basic Metabolic Panel: Recent Labs  Lab 09/12/21 [phone] 09/13/21 3790 09/14/21 0522 09/14/21 0613 09/15/21 0331 09/16/21 0618  NA 134* 134* 137  --  139 138  K 4.1 3.9 3.8  --  4.4 4.1  CL 105 104 104  --  110 108  CO2 22 23 23   --  24 23  GLUCOSE 133* 138* 167*  --  161* 95  BUN 20 21* 19  --  18 20  CREATININE 0.89 0.83 0.83  --  0.89 0.81  CALCIUM 8.0* 7.8* 8.2*  --  8.0* 7.8*  MG 2.1 2.1  --  2.1 2.1 2.0  PHOS 3.1 3.2  --  2.8 2.7 4.0   GFR Estimated Creatinine Clearance: 132.9 mL/min (by C-G formula based on SCr of 0.81 mg/dL). Liver Function Tests: Recent Labs  Lab 09/11/21 0642 09/14/21 0522 09/15/21 0331  AST 16 23 20   ALT 23 22 19   ALKPHOS 107 105 88  BILITOT 0.4 0.5 0.6  PROT 7.0 7.1 6.3*  ALBUMIN 2.8* 2.7* 2.5*   Recent Labs   Lab 09/14/21 0522  LIPASE 37    No results for input(s): AMMONIA in the last 168 hours. Coagulation profile No results for input(s): INR, PROTIME in the last 168 hours.   CBC: Recent Labs  Lab 09/14/21 0522 09/15/21 0331 09/17/21 0622  WBC 14.3* 14.2* 12.6*  HGB 12.5* 11.2* 11.1*  HCT 37.9* 35.1* 34.2*  MCV 88.8 90.9 90.5  PLT 332 280 277   Cardiac Enzymes: No results for input(s): CKTOTAL, CKMB, CKMBINDEX, TROPONINI in the last 168 hours. BNP: Invalid input(s): POCBNP CBG: Recent Labs  Lab 09/16/21 1202 09/16/21 2020 09/16/21 2316 09/17/21 0622 09/17/21 1203  GLUCAP 98 133* 110* 104* 97   D-Dimer No results for input(s): DDIMER in the last 72 hours. Hgb A1c No results for input(s): HGBA1C in the last 72 hours. Lipid Profile Recent Labs    09/15/21 0331  TRIG 68   Thyroid function studies No results for input(s): TSH, T4TOTAL, T3FREE, THYROIDAB in the last 72 hours.  Invalid input(s): FREET3 Anemia work up No results for input(s): VITAMINB12, FOLATE, FERRITIN, TIBC, IRON, RETICCTPCT in  the last 72 hours. Microbiology Recent Results (from the past 240 hour(s))  Resp Panel by RT-PCR (Flu A&B, Covid) Nasopharyngeal Swab     Status: None   Collection Time: 09/14/21  7:46 AM   Specimen: Nasopharyngeal Swab; Nasopharyngeal(NP) swabs in vial transport medium  Result Value Ref Range Status   SARS Coronavirus 2 by RT PCR NEGATIVE NEGATIVE Final    Comment: (NOTE) SARS-CoV-2 target nucleic acids are NOT DETECTED.  The SARS-CoV-2 RNA is generally detectable in upper respiratory specimens during the acute phase of infection. The lowest concentration of SARS-CoV-2 viral copies this assay can detect is 138 copies/mL. A negative result does not preclude SARS-Cov-2 infection and should not be used as the sole basis for treatment or other patient management decisions. A negative result may occur with  improper specimen collection/handling, submission of specimen  other than nasopharyngeal swab, presence of viral mutation(s) within the areas targeted by this assay, and inadequate number of viral copies(<138 copies/mL). A negative result must be combined with clinical observations, patient history, and epidemiological information. The expected result is Negative.  Fact Sheet for Patients:  EntrepreneurPulse.com.au  Fact Sheet for Healthcare Providers:  IncredibleEmployment.be  This test is no t yet approved or cleared by the [place] FDA and  has been authorized for detection and/or diagnosis of SARS-CoV-2 by FDA under an Emergency Use Authorization (EUA). This EUA will remain  in effect (meaning this test can be used) for the duration of the COVID-19 declaration under Section 564(b)(1) of the Act, 21 U.S.C.section 360bbb-3(b)(1), unless the authorization is terminated  or revoked sooner.       Influenza A by PCR NEGATIVE NEGATIVE Final   Influenza B by PCR NEGATIVE NEGATIVE Final    Comment: (NOTE) The Xpert Xpress SARS-CoV-2/FLU/RSV plus assay is intended as an aid in the diagnosis of influenza from Nasopharyngeal swab specimens and should not be used as a sole basis for treatment. Nasal washings and aspirates are unacceptable for Xpert Xpress SARS-CoV-2/FLU/RSV testing.  Fact Sheet for Patients: EntrepreneurPulse.com.au  Fact Sheet for Healthcare Providers: IncredibleEmployment.be  This test is not yet approved or cleared by the [place] FDA and has been authorized for detection and/or diagnosis of SARS-CoV-2 by FDA under an Emergency Use Authorization (EUA). This EUA will remain in effect (meaning this test can be used) for the duration of the COVID-19 declaration under Section 564(b)(1) of the Act, 21 U.S.C. section 360bbb-3(b)(1), unless the authorization is terminated or revoked.  Performed at [facility], [place] [address] [place], [place] [address]       Studies:  No results found.  Assessment: 56 y.o. 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 Will give FOLFOXFIRI with dose reduction and adequate pre-med due to his previous allergic reaction to oxaliplatin (hives).  -will transfer pt to [place] today when bed is available. I spoke with charge nurse Rollene Fare today  -We will follow-up tomorrow.   [name], MD 09/17/2021

## 2021-09-17 NOTE — Progress Notes (Signed)
PROGRESS NOTE    KOLTEN RYBACK  ZOX:[id number] DOB: [birth year] DOA: 09/14/2021 PCP: [name], MD   Brief Narrative: [name], 56 y.o. 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 In the ED afebrile mildly tachycardic, labs with leukocytosis likely poor, stable hemoglobin and renal function Lumin low 2.7 Abdominal x-ray showed interval improvement in gaseous distention of the small and large bowel loops compared with 12/26 X-ray showed left lower lung opacity concern about aspiration placed on antibiotics   Assessment & Plan:   Intractable abdominal pain Nausea and vomiting Recurrent issue. Recently reinserted PEG tube placement for venting. Palliative care consulted for pain management. Pain control is improving with analgesics. -Palliative care medicine recommendations: Fentanyl patch, Decadron, Dilaudid PO PRN -Minimize IV narcotics  Aspiration pneumonia In setting of recent vomiting. Started on empiric Unasyn -Continue Unasyn. Treat for 5 days  Metastatic colon cancer Metastasis to multiple sites. Medical oncology consulted with plan for inpatient chemotherapy.  Anxiety -Continue Xanax prn  GERD -Continue Protonix  Moderate malnutrition TPN started this admission -Continue TPN  Normocytic anemia Mild. Stable.  Hyperglycemia Hemoglobin A1C of 5.5%  Elevated blood pressure No diagnosis of hypertension -Hydralazine PO prn  Obesity Body mass  index is 36.22 kg/m.   DVT prophylaxis: Lovenox Code Status:   Code Status: Partial Code Family Communication: Friend at bedside Disposition Plan: Discharge home vs SNF pending PT/OT eval in addition to medical oncology/palliative care recommendations/management. Anticipate discharge in 3+ days   Consultants:  Medical oncology Palliative care medicine  Procedures:  None  Antimicrobials: Unasyn IV    Subjective: Pain is slightly better controlled  Objective: Vitals:   09/16/21 2017 09/17/21 1356 09/17/21 1435 09/17/21 1846  BP: (!) 142/93 (!) 144/94 (!) 138/99 (!) 161/99  Pulse: 83 82 85 76  Resp: 17 16 18 16   Temp: 98.4 F (36.9 C) 98 F (36.7 C) (!) 97.2 F (36.2 C) (!) 97.4 F (36.3 C)  TempSrc: Oral Oral Oral Oral  SpO2: 96% 96% 98% 97%  Weight:      Height:        Intake/Output Summary (Last 24 hours) at 09/17/2021 1925 Last data filed at 09/17/2021 1831 Gross per 24 hour  Intake 3738.13 ml  Output 525 ml  Net 3213.13 ml   Filed Weights   09/14/21 1058 09/14/21 1847  Weight: 117.8 kg 117.8 kg    Examination:  General exam: Appears calm and comfortable Respiratory system: Clear to auscultation. Respiratory effort normal. Cardiovascular system: S1 & S2 heard, RRR. No murmurs, rubs, gallops or clicks. Gastrointestinal system: Abdomen is nondistended, soft and nontender. No organomegaly or masses felt. Normal bowel sounds heard. Central nervous system: Alert and oriented. No focal neurological deficits. Musculoskeletal:  No calf tenderness Skin: No cyanosis. No rashes Psychiatry: Judgement and insight appear normal. Mood & affect appropriate.     Data Reviewed: I have personally reviewed following labs and imaging studies  CBC Lab Results  Component Value Date   WBC 12.6 (H) 09/17/2021   RBC 3.78 (L) 09/17/2021   HGB  11.1 (L) 09/17/2021   HCT 34.2 (L) 09/17/2021   MCV 90.5 09/17/2021   MCH 29.4 09/17/2021   PLT 277 09/17/2021   MCHC 32.5  09/17/2021   RDW 15.1 09/17/2021   LYMPHSABS 0.7 09/10/2021   MONOABS 1.4 (H) 09/10/2021   EOSABS 0.2 09/10/2021   BASOSABS 0.0 40/34/7425     Last metabolic panel Lab Results  Component Value Date   NA 138 09/16/2021   K 4.1 09/16/2021   CL 108 09/16/2021   CO2 23 09/16/2021   BUN 20 09/16/2021   CREATININE 0.81 09/16/2021   GLUCOSE 95 09/16/2021   GFRNONAA >60 09/16/2021   GFRAA 88 09/04/2020   CALCIUM 7.8 (L) 09/16/2021   PHOS 4.0 09/16/2021   PROT 6.3 (L) 09/15/2021   ALBUMIN 2.5 (L) 09/15/2021   LABGLOB 2.7 09/04/2020   AGRATIO 1.7 09/04/2020   BILITOT 0.6 09/15/2021   ALKPHOS 88 09/15/2021   AST 20 09/15/2021   ALT 19 09/15/2021   ANIONGAP 7 09/16/2021    CBG (last 3)  Recent Labs    09/16/21 2316 09/17/21 0622 09/17/21 1203  GLUCAP 110* 104* 97     GFR: Estimated Creatinine Clearance: 132.9 mL/min (by C-G formula based on SCr of 0.81 mg/dL).  Coagulation Profile: No results for input(s): INR, PROTIME in the last 168 hours.  Recent Results (from the past 240 hour(s))  Resp Panel by RT-PCR (Flu A&B, Covid) Nasopharyngeal Swab     Status: None   Collection Time: 09/14/21  7:46 AM   Specimen: Nasopharyngeal Swab; Nasopharyngeal(NP) swabs in vial transport medium  Result Value Ref Range Status   SARS Coronavirus 2 by RT PCR NEGATIVE NEGATIVE Final    Comment: (NOTE) SARS-CoV-2 target nucleic acids are NOT DETECTED.  The SARS-CoV-2 RNA is generally detectable in upper respiratory specimens during the acute phase of infection. The lowest concentration of SARS-CoV-2 viral copies this assay can detect is 138 copies/mL. A negative result does not preclude SARS-Cov-2 infection and should not be used as the sole basis for treatment or other patient management decisions. A negative result may occur with  improper specimen collection/handling, submission of specimen other than nasopharyngeal swab, presence of viral mutation(s) within the areas targeted by  this assay, and inadequate number of viral copies(<138 copies/mL). A negative result must be combined with clinical observations, patient history, and epidemiological information. The expected result is Negative.  Fact Sheet for Patients:  EntrepreneurPulse.com.au  Fact Sheet for Healthcare Providers:  IncredibleEmployment.be  This test is no t yet approved or cleared by the [place] FDA and  has been authorized for detection and/or diagnosis of SARS-CoV-2 by FDA under an Emergency Use Authorization (EUA). This EUA will remain  in effect (meaning this test can be used) for the duration of the COVID-19 declaration under Section 564(b)(1) of the Act, 21 U.S.C.section 360bbb-3(b)(1), unless the authorization is terminated  or revoked sooner.       Influenza A by PCR NEGATIVE NEGATIVE Final   Influenza B by PCR NEGATIVE NEGATIVE Final    Comment: (NOTE) The Xpert Xpress SARS-CoV-2/FLU/RSV plus assay is intended as an aid in the diagnosis of influenza from Nasopharyngeal swab specimens and should not be used as a sole basis for treatment. Nasal washings and aspirates are unacceptable for Xpert Xpress SARS-CoV-2/FLU/RSV testing.  Fact Sheet for Patients: EntrepreneurPulse.com.au  Fact Sheet for Healthcare Providers: IncredibleEmployment.be  This test is not yet approved or cleared by the [place] FDA and has been authorized for detection and/or  diagnosis of SARS-CoV-2 by FDA under an Emergency Use Authorization (EUA). This EUA will remain in effect (meaning this test can be used) for the duration of the COVID-19 declaration under Section 564(b)(1) of the Act, 21 U.S.C. section 360bbb-3(b)(1), unless the authorization is terminated or revoked.  Performed at [facility], [place] [address], [place], [place] [address]         Radiology Studies: No results  found.      Scheduled Meds:  Chlorhexidine Gluconate Cloth  6 each Topical Daily   dexamethasone  4 mg Oral Daily   enoxaparin (LOVENOX) injection  40 mg Subcutaneous Q24H   fentaNYL  1 patch Transdermal Q72H   insulin aspart  0-9 Units Subcutaneous 4 times per day   pantoprazole (PROTONIX) IV  40 mg Intravenous Q24H   senna-docusate  2 tablet Oral QHS   sodium chloride flush  10-40 mL Intracatheter Q12H   Continuous Infusions:  sodium chloride 50 mL/hr at 09/17/21 0115   ampicillin-sulbactam (UNASYN) IV 3 g (09/17/21 1816)   promethazine (PHENERGAN) injection (IM or IVPB) 25 mg (61/44/31 5400)   TPN CYCLIC-ADULT (ION)       LOS: 2 days     Cordelia Poche, MD [facility] 09/17/2021, 7:25 PM  If 7PM-7AM, please contact night-coverage [email or web address]

## 2021-09-17 NOTE — Discharge Instructions (Addendum)
Ricardo [name],  You were in the hospital with aspiration pneumonia and pain from your cancer. Your pneumonia has been successfully treated. Your pain management has improved. Please follow-up with your oncologist and palliative care for continued management.

## 2021-09-17 NOTE — Progress Notes (Signed)
Palliative Medicine Inpatient Follow Up Note     Chart Reviewed. Patient assessed at the bedside.   Ricardo [name] sitting upright in bed.  No acute distress noted.  He does have a friend at the bedside.  Provides permission to further discuss care plan in the presence of visitor.  Reports pain seems manageable on current regimen however remains appropriately concerned about what things will look like once he goes back home. He does not like coming back and forth to the hospital and is hopeful that things will improve with upcoming chemotherapy and adjustments to his pain regimen. Still has not had a bowel movement.   We discussed at length his current regimen compared to his home regimen. He feels that his pain is somewhat improved today with initiation of decadron and fentanyl patch. States he can tell a difference but does endorse ongoing pain. He is receiving IV dilaudid every 3 hours which he is consistently taking. His home regimen he was on 2 mg oral dilaudid. Education provided on plans to adjust regimen and began planning for discharge home. He states he is able to tolerate oral medications. We will plan to restart his oral dilaudid and adjust accordingly with a goal of scaling back on IV meds knowing this is not a home option. IV dilaudid breakthrough will remain available.   He is not currently on bowel regimen. Nausea has improved. Has not had a bowl movement although reports he has the urge with no success. Tolerated Miralax at home. In the setting of ongoing malignant bowel obstruction will consider senna and colace for bowel support.   Ricardo [name] continues to report large amounts of output via G-tube. Output has been poorly documented to assess actual output amount. Discussed with nursing staff regarding strict output. He reports emptying this himself 3-4 times daily with about 600 ml each time. Will continue to closely evaluate and consider robinul or octreotide to help decrease  secretions.   All questions answered and support provided. Ricardo [name] expressed understanding and appreciation of discussed plans.    Objective Assessment: Vital Signs Vitals:   09/16/21 1008 09/16/21 2017  BP: (!) 146/96 (!) 142/93  Pulse: 99 83  Resp: 18 17  Temp: 99.1 F (37.3 C) 98.4 F (36.9 C)  SpO2: 94% 96%    Intake/Output Summary (Last 24 hours) at 09/17/2021 1341 Last data filed at 09/17/2021 2595 Gross per 24 hour  Intake 3738.13 ml  Output 300 ml  Net 3438.13 ml   Last Weight  Most recent update: 09/14/2021  6:48 PM    Weight  117.8 kg (259 lb 11.2 oz)            Gen:  NAD CV: RRR PULM: Normal breathing pattern  ABD: soft/nontender/ some distention/G-tube in place to drain EXT: No edema Neuro: Alert and oriented x3  SUMMARY OF RECOMMENDATIONS   Continue with current plan of care per medical team. Per Dr. Burr Medico plans to move to 6th floor and initiate chemotherapy in next 24 hours.  Dilaudid po as needed for pain. Will continue IV dilaudid for breakthrough pain not resolved with orals. Discussed with patient and will begin adjusting regimen in preparation for discharging home with a goal of well controlled pain.  Senna-S at bedtime for bowel regimen Strict output for G-tube. RN staff aware. Could consider Robinul 0.2mg  BID or Octreotide long-acting 30 mg to assist with decreased secretion production. PMT will continue to support and follow on as needed basis. Please secure chat for  urgent needs.   Discussed with RN.   Time Total: 45 min.   Visit consisted of counseling and education dealing with the complex and emotionally intense issues of symptom management and palliative care in the setting of serious and potentially life-threatening illness.Greater than 50%  of this time was spent counseling and coordinating care related to the above assessment and plan.  Alda Lea, AGPCNP-BC  Palliative Medicine Team [phone]  Palliative Medicine Team  providers are available by phone from 7am to 7pm daily and can be reached through the team cell phone. Should this patient require assistance outside of these hours, please call the patient's attending physician.

## 2021-09-18 ENCOUNTER — Inpatient Hospital Stay (HOSPITAL_COMMUNITY): Payer: BC Managed Care – PPO

## 2021-09-18 ENCOUNTER — Inpatient Hospital Stay: Payer: BC Managed Care – PPO

## 2021-09-18 ENCOUNTER — Inpatient Hospital Stay: Payer: Self-pay

## 2021-09-18 ENCOUNTER — Inpatient Hospital Stay: Payer: BC Managed Care – PPO | Admitting: Nurse Practitioner

## 2021-09-18 ENCOUNTER — Inpatient Hospital Stay: Payer: BC Managed Care – PPO | Admitting: Hematology

## 2021-09-18 DIAGNOSIS — R109 Unspecified abdominal pain: Secondary | ICD-10-CM | POA: Diagnosis not present

## 2021-09-18 LAB — PHOSPHORUS: Phosphorus: 4.1 mg/dL (ref 2.5–4.6)

## 2021-09-18 LAB — COMPREHENSIVE METABOLIC PANEL
ALT: 21 U/L (ref 0–44)
AST: 19 U/L (ref 15–41)
Albumin: 2.6 g/dL — ABNORMAL LOW (ref 3.5–5.0)
Alkaline Phosphatase: 108 U/L (ref 38–126)
Anion gap: 7 (ref 5–15)
BUN: 13 mg/dL (ref 6–20)
CO2: 26 mmol/L (ref 22–32)
Calcium: 8 mg/dL — ABNORMAL LOW (ref 8.9–10.3)
Chloride: 99 mmol/L (ref 98–111)
Creatinine, Ser: 0.9 mg/dL (ref 0.61–1.24)
GFR, Estimated: >60 mL/min (ref >60)
Glucose, Bld: 108 mg/dL — ABNORMAL HIGH (ref 70–99)
Potassium: 3.9 mmol/L (ref 3.5–5.1)
Sodium: 132 mmol/L — ABNORMAL LOW (ref 135–145)
Total Bilirubin: 0.7 mg/dL (ref 0.3–1.2)
Total Protein: 6.5 g/dL (ref 6.5–8.1)

## 2021-09-18 LAB — GLUCOSE, CAPILLARY
Glucose-Capillary: 107 mg/dL — ABNORMAL HIGH (ref 70–99)
Glucose-Capillary: 113 mg/dL — ABNORMAL HIGH (ref 70–99)
Glucose-Capillary: 204 mg/dL — ABNORMAL HIGH (ref 70–99)
Glucose-Capillary: 235 mg/dL — ABNORMAL HIGH (ref 70–99)

## 2021-09-18 LAB — MAGNESIUM: Magnesium: 2 mg/dL (ref 1.7–2.4)

## 2021-09-18 MED ORDER — COLD PACK MISC ONCOLOGY
1.0000 | Freq: Once | Status: AC | PRN
  Filled 2021-09-18: qty 1

## 2021-09-18 MED ORDER — PALONOSETRON HCL INJECTION 0.25 MG/5ML
0.2500 mg | Freq: Once | INTRAVENOUS | Status: AC
  Administered 2021-09-18: 0.25 mg via INTRAVENOUS
  Filled 2021-09-18: qty 5

## 2021-09-18 MED ORDER — LORATADINE 10 MG PO TABS
10.0000 mg | ORAL_TABLET | Freq: Once | ORAL | Status: AC
  Administered 2021-09-18: 10 mg via ORAL
  Filled 2021-09-18: qty 1

## 2021-09-18 MED ORDER — DIPHENHYDRAMINE HCL 50 MG/ML IJ SOLN
50.0000 mg | Freq: Once | INTRAMUSCULAR | Status: AC
  Administered 2021-09-18: 50 mg via INTRAVENOUS
  Filled 2021-09-18: qty 1

## 2021-09-18 MED ORDER — SODIUM CHLORIDE 0.9 % IV SOLN: 2800.0000 mg/m2 | INTRAVENOUS | Status: DC

## 2021-09-18 MED ORDER — SODIUM CHLORIDE 0.9 % IV SOLN
150.0000 mg/m2 | Freq: Once | INTRAVENOUS | Status: AC
  Administered 2021-09-18: 360 mg via INTRAVENOUS
  Filled 2021-09-18: qty 18

## 2021-09-18 MED ORDER — BISACODYL 10 MG RE SUPP
10.0000 mg | Freq: Every day | RECTAL | Status: DC | PRN
  Administered 2021-09-18 – 2021-09-21 (×2): 10 mg via RECTAL
  Filled 2021-09-18 (×2): qty 1

## 2021-09-18 MED ORDER — FLUOROURACIL CHEMO INJECTION 5 GM/100ML
2400.0000 mg/m2 | Freq: Once | INTRAVENOUS | Status: AC
  Administered 2021-09-18: 5850 mg via INTRAVENOUS
  Filled 2021-09-18: qty 117

## 2021-09-18 MED ORDER — SODIUM CHLORIDE 0.9 % IV SOLN: INTRAVENOUS | Status: DC

## 2021-09-18 MED ORDER — METHYLPREDNISOLONE SODIUM SUCC 125 MG IJ SOLR
125.0000 mg | Freq: Once | INTRAMUSCULAR | Status: AC
  Administered 2021-09-18: 125 mg via INTRAVENOUS
  Filled 2021-09-18: qty 2

## 2021-09-18 MED ORDER — DEXTROSE 5 % IV SOLN: Freq: Once | INTRAVENOUS | Status: DC

## 2021-09-18 MED ORDER — HOT PACK MISC ONCOLOGY
1.0000 | Freq: Once | Status: AC | PRN
  Filled 2021-09-18: qty 1

## 2021-09-18 MED ORDER — TRAVASOL 10 % IV SOLN
INTRAVENOUS | Status: AC
  Filled 2021-09-18: qty 1254

## 2021-09-18 MED ORDER — MONTELUKAST SODIUM 10 MG PO TABS
10.0000 mg | ORAL_TABLET | Freq: Once | ORAL | Status: AC
  Administered 2021-09-18: 10 mg via ORAL
  Filled 2021-09-18: qty 1

## 2021-09-18 MED ORDER — HYDROMORPHONE HCL 1 MG/ML IJ SOLN
1.0000 mg | INTRAMUSCULAR | Status: DC | PRN
  Administered 2021-09-18 – 2021-09-21 (×15): 1 mg via INTRAVENOUS
  Filled 2021-09-18 (×16): qty 1

## 2021-09-18 MED ORDER — ATROPINE SULFATE 1 MG/ML IV SOLN
0.5000 mg | Freq: Once | INTRAVENOUS | Status: DC | PRN
  Filled 2021-09-18: qty 0.5

## 2021-09-18 MED ORDER — LEUCOVORIN CALCIUM INJECTION 350 MG
400.0000 mg/m2 | Freq: Once | INTRAVENOUS | Status: AC
  Administered 2021-09-18: 972 mg via INTRAVENOUS
  Filled 2021-09-18: qty 48.6

## 2021-09-18 MED ORDER — OXALIPLATIN CHEMO INJECTION 100 MG/20ML
70.0000 mg/m2 | Freq: Once | INTRAVENOUS | Status: AC
  Administered 2021-09-18: 170 mg via INTRAVENOUS
  Filled 2021-09-18: qty 34

## 2021-09-18 MED ORDER — FENTANYL 100 MCG/HR TD PT72
1.0000 | MEDICATED_PATCH | TRANSDERMAL | Status: DC
Start: 2021-09-18 — End: 2021-09-21
  Administered 2021-09-18: 1 via TRANSDERMAL
  Filled 2021-09-18: qty 1

## 2021-09-18 MED ORDER — SODIUM CHLORIDE 0.9 % IV SOLN: Freq: Once | INTRAVENOUS | Status: DC

## 2021-09-18 MED ORDER — SODIUM CHLORIDE 0.9 % IV SOLN
150.0000 mg | Freq: Once | INTRAVENOUS | Status: AC
  Administered 2021-09-18: 150 mg via INTRAVENOUS
  Filled 2021-09-18: qty 5

## 2021-09-18 MED ORDER — FAMOTIDINE IN NACL 20-0.9 MG/50ML-% IV SOLN
20.0000 mg | Freq: Once | INTRAVENOUS | Status: AC
  Administered 2021-09-18: 20 mg via INTRAVENOUS
  Filled 2021-09-18: qty 50

## 2021-09-18 NOTE — Progress Notes (Signed)
PROGRESS NOTE    [name]  DXA:[id number] DOB: 11/22/1965 DOA: 09/14/2021 PCP: Ricardo Pollen, MD   Brief Narrative: Ricardo [name], 56 y.o. 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 In the ED afebrile mildly tachycardic, labs with leukocytosis likely poor, stable hemoglobin and renal function Lumin low 2.7 Abdominal x-ray showed interval improvement in gaseous distention of the small and large bowel loops compared with 12/26 X-ray showed left lower lung opacity concern about aspiration placed on antibiotics   Assessment & Plan:   Intractable abdominal pain Nausea and vomiting Recurrent issue. Recently reinserted PEG tube placement for venting. Palliative care consulted for pain management. Pain control is improving with analgesics. -Palliative care medicine recommendations: Fentanyl patch, Decadron, Dilaudid PO PRN -Minimize IV narcotics to ensure proper outpatient oral regimen  Aspiration pneumonia In setting of recent vomiting. Started on empiric Unasyn -Continue Unasyn. Treat for 5 days  Metastatic colon cancer Metastasis to multiple sites. Medical oncology consulted with plan for inpatient chemotherapy.  Anxiety -Continue Xanax prn  GERD -Continue Protonix  Moderate malnutrition TPN started this admission -Continue TPN  Normocytic anemia Mild. Stable.  Hyperglycemia Hemoglobin A1C of 5.5%  Elevated blood pressure No diagnosis of  hypertension -Hydralazine PO prn  Obesity Body mass index is 36.22 kg/m.   DVT prophylaxis: Lovenox Code Status:   Code Status: Partial Code Family Communication: Wife at bedside Disposition Plan: Discharge home vs SNF pending PT eval in addition to medical oncology/palliative care recommendations/management. Anticipate discharge in 3+ days   Consultants:  Medical oncology Palliative care medicine  Procedures:  None  Antimicrobials: Unasyn IV    Subjective: Worsened pain secondary to patient accidentally clamping PEG tube. Was not able to receive TPN overnight.  Objective: Vitals:   09/17/21 1435 09/17/21 1846 09/17/21 2215 09/18/21 0438  BP: (!) 138/99 (!) 161/99 (!) 153/93 (!) 143/98  Pulse: 85 76 74 73  Resp: 18 16 19 19   Temp: (!) 97.2 F (36.2 C) (!) 97.4 F (36.3 C) 98 F (36.7 C) (!) 97.5 F (36.4 C)  TempSrc: Oral Oral Oral Oral  SpO2: 98% 97% 94% 95%  Weight:      Height:        Intake/Output Summary (Last 24 hours) at 09/18/2021 1019 Last data filed at 09/18/2021 0845 Gross per 24 hour  Intake 1799.23 ml  Output 575 ml  Net 1224.23 ml    Filed Weights   09/14/21 1058 09/14/21 1847  Weight: 117.8 kg 117.8 kg    Examination:  General exam: Appears calm and comfortable Respiratory system: Clear to auscultation. Respiratory effort normal. Cardiovascular system: S1 & S2 heard, RRR. No murmurs, rubs, gallops or clicks. Gastrointestinal system: Abdomen is slightly distended, soft and nontender. No organomegaly or masses felt. Normal bowel sounds heard. Central nervous system: Alert and oriented. No focal neurological deficits. Musculoskeletal: No edema. No calf tenderness Skin: No cyanosis. No rashes Psychiatry: Judgement and insight appear normal. Mood & affect appropriate.     Data Reviewed: I have personally reviewed following labs and imaging studies  CBC  Lab Results  Component Value Date   WBC 12.6 (H) 09/17/2021   RBC 3.78 (L)  09/17/2021   HGB 11.1 (L) 09/17/2021   HCT 34.2 (L) 09/17/2021   MCV 90.5 09/17/2021   MCH 29.4 09/17/2021   PLT 277 09/17/2021   MCHC 32.5 09/17/2021   RDW 15.1 09/17/2021   LYMPHSABS 0.7 09/10/2021   MONOABS 1.4 (H) 09/10/2021   EOSABS 0.2 09/10/2021   BASOSABS 0.0 95/63/8756     Last metabolic panel Lab Results  Component Value Date   NA 132 (L) 09/18/2021   K 3.9 09/18/2021   CL 99 09/18/2021   CO2 26 09/18/2021   BUN 13 09/18/2021   CREATININE 0.90 09/18/2021   GLUCOSE 108 (H) 09/18/2021   GFRNONAA >60 09/18/2021   GFRAA 88 09/04/2020   CALCIUM 8.0 (L) 09/18/2021   PHOS 4.1 09/18/2021   PROT 6.5 09/18/2021   ALBUMIN 2.6 (L) 09/18/2021   LABGLOB 2.7 09/04/2020   AGRATIO 1.7 09/04/2020   BILITOT 0.7 09/18/2021   ALKPHOS 108 09/18/2021   AST 19 09/18/2021   ALT 21 09/18/2021   ANIONGAP 7 09/18/2021    CBG (last 3)  Recent Labs    09/17/21 1203 09/17/21 2216 09/18/21 0813  GLUCAP 97 93 107*      GFR: Estimated Creatinine Clearance: 119.6 mL/min (by C-G formula based on SCr of 0.9 mg/dL).  Coagulation Profile: No results for input(s): INR, PROTIME in the last 168 hours.  Recent Results (from the past 240 hour(s))  Resp Panel by RT-PCR (Flu A&B, Covid) Nasopharyngeal Swab     Status: None   Collection Time: 09/14/21  7:46 AM   Specimen: Nasopharyngeal Swab; Nasopharyngeal(NP) swabs in vial transport medium  Result Value Ref Range Status   SARS Coronavirus 2 by RT PCR NEGATIVE NEGATIVE Final    Comment: (NOTE) SARS-CoV-2 target nucleic acids are NOT DETECTED.  The SARS-CoV-2 RNA is generally detectable in upper respiratory specimens during the acute phase of infection. The lowest concentration of SARS-CoV-2 viral copies this assay can detect is 138 copies/mL. A negative result does not preclude SARS-Cov-2 infection and should not be used as the sole basis for treatment or other patient management decisions. A negative result may occur with   improper specimen collection/handling, submission of specimen other than nasopharyngeal swab, presence of viral mutation(s) within the areas targeted by this assay, and inadequate number of viral copies(<138 copies/mL). A negative result must be combined with clinical observations, patient history, and epidemiological information. The expected result is Negative.  Fact Sheet for Patients:  EntrepreneurPulse.com.au  Fact Sheet for Healthcare Providers:  IncredibleEmployment.be  This test is no t yet approved or cleared by the [place] FDA and  has been authorized for detection and/or diagnosis of SARS-CoV-2 by FDA under an Emergency Use Authorization (EUA). This EUA will remain  in effect (meaning this test can be used) for the duration of the COVID-19 declaration under Section 564(b)(1) of the Act, 21 U.S.C.section 360bbb-3(b)(1), unless the authorization is terminated  or revoked sooner.       Influenza A by PCR NEGATIVE NEGATIVE Final   Influenza B by PCR NEGATIVE NEGATIVE Final    Comment: (NOTE) The Xpert Xpress SARS-CoV-2/FLU/RSV plus assay is intended as an aid in the diagnosis of influenza from Nasopharyngeal swab specimens and should not be used as a sole basis for treatment. Nasal washings and aspirates are unacceptable for Xpert Xpress SARS-CoV-2/FLU/RSV testing.  Fact Sheet for Patients: EntrepreneurPulse.com.au  Fact Sheet for Healthcare  Providers: IncredibleEmployment.be  This test is not yet approved or cleared by the [place] and has been authorized for detection and/or diagnosis of SARS-CoV-2 by FDA under an Emergency Use Authorization (EUA). This EUA will remain in effect (meaning this test can be used) for the duration of the COVID-19 declaration under Section 564(b)(1) of the Act, 21 U.S.C. section 360bbb-3(b)(1), unless the authorization is terminated  or revoked.  Performed at [facility], [place] [address], [place], [place] [address]          Radiology Studies: [place] EKG SITE RITE  Result Date: 09/18/2021 If Site Rite image not attached, placement could not be confirmed due to current cardiac rhythm.       Scheduled Meds:  Chlorhexidine Gluconate Cloth  6 each Topical Daily   dexamethasone  4 mg Oral Daily   diphenhydrAMINE  50 mg Intravenous Once   enoxaparin (LOVENOX) injection  40 mg Subcutaneous Q24H   fentaNYL  1 patch Transdermal Q72H   FLUOROURACIL (ADRUCIL) CHEMO infusion For Inpatient Use  2,400 mg/m2 (Treatment Plan Recorded) Intravenous Once   insulin aspart  0-9 Units Subcutaneous 4 times per day   irinotecan (CAMPTOSAR) CHEMO IV infusion  150 mg/m2 (Treatment Plan Recorded) Intravenous Once   leucovorin (WELLCOVORIN) IV infusion  400 mg/m2 (Treatment Plan Recorded) Intravenous Once   loratadine  10 mg Oral Once   methylPREDNISolone (SOLU-MEDROL) injection  125 mg Intravenous Once   montelukast  10 mg Oral Once   oxaliplatin (ELOXATIN) CHEMO IV infusion  70 mg/m2 (Treatment Plan Recorded) Intravenous Once   palonosetron  0.25 mg Intravenous Once   pantoprazole (PROTONIX) IV  40 mg Intravenous Q24H   senna-docusate  2 tablet Oral QHS   sodium chloride flush  10-40 mL Intracatheter Q12H   Continuous Infusions:  sodium chloride 50 mL/hr at 09/18/21 0648   ampicillin-sulbactam (UNASYN) IV Stopped (09/18/21 6045)   dextrose 95 mL/hr at 09/18/21 0648   dextrose     dextrose     famotidine (PEPCID) IV (ONCOLOGY)     fosaprepitant (EMEND) IV infusion 150 mg     promethazine (PHENERGAN) injection (IM or IVPB) 25 mg (09/14/21 1055)     LOS: 3 days     Cordelia Poche, MD [facility] 09/18/2021, 10:19 AM  If 7PM-7AM, please contact night-coverage [email or web address]

## 2021-09-18 NOTE — Progress Notes (Signed)
Per IV team RN, no tubing available system wide for TPN. Pharmacy placed order to run D10 @ 95cc/hr in place of TPN tonight. Hortencia Conradi RN

## 2021-09-18 NOTE — Progress Notes (Signed)
TPN delayed until PICC placement confirmed

## 2021-09-18 NOTE — Progress Notes (Signed)
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  He was recently hospitalized and discharged on 09/13/21 with home TPN.  Pharmacy managed his TPN during that admission.  He presented back to the ED on 09/14/21 with c/o abdominal pain and N/V.  Pharmacy has been consulted to manage pt's TPN.  - pt was on cyclic (12 hr) TPN PTA. Patient's wife stated that last TPN bag hung on 09/13/21 at 7:30p and this was stopped on 09/14/21 at 7:30a  Glucose / Insulin: No hx DM. Hgb A1c 5.5% -CBG goal <180; CBG range: 93-107. 1 units SSI/24 hrs. -Solu-Medrol 40 mg IV x1 dose given 1/1, dexamethasone 4 mg PO daily started 1/3 & SoluMedrol 125mg  x1 on 1/5 as pre-med for chemo Electrolytes: Na sl low, CorrCa 9.1 Renal: SCr <1; stable. BUN WNL Hepatic: LFTs WNL - albumin low Intake / Output; MIVF: No MIVF, suspect strict I/O not charted, UOP charted as 390ml/24 hr Drain output: 227ml/24 hr, confirmed with RN that G-tube is venting; stool: none GI Imaging: - 09/02/21 abd CT:  findings consistent with small-bowel obstruction, free fluid rt abdomen, small l pleural effusion, stable metastatic disease - 12/26 KUB: continued SBO pattern GI Surgeries / Procedures:  - 5/20 lap appy - 3/22 ex lap  w/ partial bowel resection and ostomy creation - 8/22 diagnostic laprascopy w/ peritoneal biopsies - 12/29: perc gastrostomy  Central access: implanted port 12/12/20                             PICC placement 1/5 TPN start date: 09/04/21 --> resumed on adm on 09/14/21  Nutritional Goals: Goal TPN 2361mL/24hr (provides 125 g of protein and  2234 kcals per day)  RD Assessment (on 12/29) : Estimated Needs Total Energy Estimated Needs: 2200-2400 kcal Total Protein Estimated Needs: 115-125 grams Total Fluid Estimated Needs: >/= 2.4 L/day  Current Nutrition:  TPN NPO except for ice chips, sips with meds Note TPN not available 1/4 pm, D10W hung at 95 ml/hr in place of TPN - d/c for access for chemo pre-meds  Plan:  Place PICC  for TPN, will require port and PICC with next chemo treatment Chemo today - FOLFOXIRI  At 1800: Continue TPN cyclic rate over 12 hr, from 6pm to 6am Electrolytes in TPN: No changes Na 110 mEq/L K 35 mEq/L Ca 71mEq/L Mg 63mEq/L Phos 55mmol/L Cl:Ac 1:1 Add standard MVI and trace elements to TPN Sensitive SSI at 8p, 11p, 7a, noon. Continue CBG checks and SSI for now since steroids initiated MD managing IVF TPN labs Mon, Thurs BMET, Mag, Phos in am  Minda Ditto, PharmD 09/18/2021,10:44 AM

## 2021-09-18 NOTE — Progress Notes (Addendum)
HEMATOLOGY-ONCOLOGY PROGRESS NOTE  ASSESSMENT AND PLAN: 1.  Metastatic colon cancer with peritoneal metastasis 2.  Abdominal pain, nausea, vomiting 3.  Aspiration pneumonia 4.  Protein calorie malnutrition 5.  Goals of care discussion  -We discussed that abdominal symptoms are likely related to disease progression from his metastatic malignancy.  The patient is aware of the incurable nature of his malignancy and that additional chemotherapy may not be fully effective in treating his symptoms.  However, he would like to consider additional chemotherapy if it is offered.  Recommend proceeding with FOLFIRINOX today.  He had a prior reaction to oxaliplatin which consisted of rash and hives.  Therefore, plan for additional premeds and to infuse oxaliplatin over 4 hours.  Discussed with nursing and with patient.  We will watch him very closely.  He is aware of the adverse effects of treatment including but not limited to myelosuppression, nausea, vomiting, mucositis, hand-foot syndrome, peripheral neuropathy, risk of infusion reaction.  He agrees to proceed. -Pain is not fully controlled this morning.  I changed Dilaudid from every 3 hours as needed to every 2 hours as needed.  Will defer further pain management to the palliative care team. -He will continue on TPN for his protein calorie malnutrition.  Discussed with pharmacy and an order has been placed for PICC line for TPN administration as chemotherapy will be infusing through his Port-A-Cath.  SUBJECTIVE: He continues to report abdominal pain.  States pain is not fully controlled this morning.  He did not notice much of a difference with the addition of the fentanyl patch.  He continues to use IV Dilaudid but states that it does not last the full 3 hours.  He is not having any nausea or vomiting this morning.  Reports some belching.  No flatus.  Bowels are not moving.  He did not receive TPN last night due to an issue with incompatible tubing.   Discussed with pharmacy who is planning for TPN this evening.  However, patient will need PICC line due to chemotherapy infusing through Port-A-Cath.  Oncology History Overview Note  Cancer Staging metastatic cecal cancer Staging form: Colon and Rectum, AJCC 8th Edition - Clinical stage from 11/29/2020: Stage IVC (cTX, cN2, pM1c) - Signed by [name], MD on 12/04/2020 Stage prefix: Initial diagnosis Histologic grade (G): G3 Histologic grading system: 4 grade system    metastatic cecal cancer  11/27/2020 Imaging   CT Angio CAP  IMPRESSION: 1. Thickening of the terminal ileum with associated proximal mid to distal small bowel obstruction. Findings could be due to an ileitis versus malignancy. Nonspecific mesenteric edema could be due to engorgement versus metastases. No associated bowel perforation. Recommend endoscopy for further evaluation. 2. Indeterminate right lower quadrant lymphadenopathy. 3. Scattered colonic diverticulosis with no acute diverticulitis. 4. Stable right hepatic lobe subcentimeter hyperdensity likely represents a hepatic hemangioma. 5. No acute vascular abnormality. Aortic Atherosclerosis (ICD10-I70.0) - mild. 6. No acute intrathoracic abnormality.   11/28/2020 Imaging   CT AP  IMPRESSION: 1. There is masslike, circumferential thickening of the terminal ileum and cecal base near the ileocecal valve and abnormally enlarged lymph nodes in the right lower quadrant mesentery adjacent to the terminal ileum measuring up to 2.3 x 1.4 cm. 2. There is extensive omental and peritoneal nodularity and caking throughout the abdomen. 3. Findings are highly concerning for primary colon malignancy with nodal and peritoneal metastatic disease. 4. Small volume perihepatic and perisplenic ascites. 5. The small bowel is generally decompressed, with full some fluid-filled, nondistended loops  throughout. There is transit of oral enteric contrast to the terminal ileum. No  evidence of overt bowel obstruction at this time. Esophagogastric tube is position with tip and side port below the diaphragm. 6. Atelectasis or consolidation of the dependent bilateral lung bases, new compared to prior examination.   Aortic Atherosclerosis (ICD10-I70.0).     11/29/2020 Surgery   EXPLORATORY LAPAROTOMY, PARTIAL BOWEL RESECTION, POSSIBLE OSTOMY CREATION, PERITIONEAL BIOPSY, INSERTION OF GASTROSTOMY TUBE by Dr [name]   11/29/2020 Initial Biopsy   FINAL MICROSCOPIC DIAGNOSIS:   AB. 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    COMMENT:   Immunohistochemistry (IHC) for CK20 and CDX-2 is strong and diffusely  positive.  CK7, TTF-1, Synaptophysin, Chromogranin and CD56 are  negative.  The immunophenotype is compatible with origin from the lower  gastrointestinal tract.  IHC for MMR will be reported separately.  Case  preliminarily discussed with Dr. [name] on 12/02/2020.   At the request of Dr. [name], [phone] was reviewed in retrospect.  Review of the submitted sections confirms the presence of acute  appendicitis.  No malignancy is identified.    11/29/2020 Cancer Staging   Staging form: Colon and Rectum, AJCC 8th Edition - Clinical stage from 11/29/2020: Stage IVC (cTX, cN2, pM1c) - Signed by [name], MD on 12/04/2020 Stage prefix: Initial diagnosis Histologic grade (G): G3 Histologic grading system: 4 grade system   12/04/2020 Initial Diagnosis   Cancer of ascending colon metastatic to intra-abdominal lymph node ([place])    Chemotherapy   FOLFOX q2weeks    12/18/2020 - 02/15/2021 Chemotherapy      Patient is on Antibody Plan: COLORECTAL BEVACIZUMAB Q14D    01/03/2021 Imaging   CT A/P IMPRESSION: 1. Wall thickening of the terminal ileum again seen. Fluid-filled distal small bowel, ascending, and transverse colon without obstruction. 2. Equivocal improvement in omental  and peritoneal metastatic disease with slightly improved small volume perihepatic and perisplenic ascites. Right lower quadrant mesenteric adenopathy is similar or mildly improved. 3. Sigmoid colonic diverticulosis. Mild mural wall thickening in the sigmoid, no acute diverticulitis. 4. Gastrostomy tube in place, balloon normally positioned in the stomach. 5. Chronic bilateral L5 pars interarticularis defects with trace anterolisthesis of L5 on S1. Chronic avascular necrosis of the left femoral head.   01/16/2021 Genetic Testing   Negative genetic testing. CTNNA1 [id number] VUS, RAD51D p.G140E VUS and TSC1 p.R768H VUS found on the CancerNext-Expanded+RNAinsight.  The CancerNext-Expanded gene panel offered by [facility] and includes sequencing and rearrangement analysis for the following 77 genes: AIP, ALK, APC*, ATM*, AXIN2, BAP1, BARD1, BLM, BMPR1A, BRCA1*, BRCA2*, BRIP1*, CDC73, CDH1*, CDK4, CDKN1B, CDKN2A, CHEK2*, CTNNA1, DICER1, FANCC, FH, FLCN, GALNT12, KIF1B, LZTR1, MAX, MEN1, MET, MLH1*, MSH2*, MSH3, MSH6*, MUTYH*, NBN, NF1*, NF2, NTHL1, PALB2*, PHOX2B, PMS2*, POT1, PRKAR1A, PTCH1, PTEN*, RAD51C*, RAD51D*, RB1, RECQL, RET, SDHA, SDHAF2, SDHB, SDHC, SDHD, SMAD4, SMARCA4, SMARCB1, SMARCE1, STK11, SUFU, TMEM127, TP53*, TSC1, TSC2, VHL and XRCC2 (sequencing and deletion/duplication); EGFR, EGLN1, HOXB13, KIT, MITF, PDGFRA, POLD1, and POLE (sequencing only); EPCAM and GREM1 (deletion/duplication only). DNA and RNA analyses performed for * genes. The report date is Jan 16, 2021.   02/24/2021 Imaging   CT AP  IMPRESSION: 1. Interval resolution of previously seen small volume ascites throughout the abdomen and pelvis. There is redemonstrated, ill-defined stranding and thickening of the peritoneal surfaces and omentum, which is somewhat diminished compared to prior examination. 2. Interval improvement in right lower quadrant mesocolon lymph nodes. 3.  Findings are consistent with treatment  response of nodal and peritoneal metastatic disease. 4. Unchanged, matted appearing thickening of the terminal ileum and cecal base. 5. Sigmoid diverticulosis with wall thickening of the mid to distal sigmoid, similar to prior examination. Findings are most suggestive of chronic sequelae of diverticulitis.   Aortic Atherosclerosis (ICD10-I70.0).   03/13/2021 -  Chemotherapy   Patient is on Treatment Plan : COLORECTAL FOLFOXIRI + Bevacizumab q14d     05/13/2021 Procedure   OPERATIVE PROCEDURE: Laparoscopy and peritoneal biopsy.  SURGEON: Merlyn Albert. Clovis Riley, MD   05/13/2021 Pathology Results   Final Pathologic Diagnosis    A. PERITONEAL, BIOPSY :              Metastatic adenocarcinoma.               See comment.    Comment    The biopsies show predominately fibrosis  and fibroadipose tissue with a small focus of moderately differentiated adenocarcinoma. Given the patient's history, the findings favor metastasis of the patient's known colonic cancer.     07/01/2021 Imaging   CT CAP  IMPRESSION: Chest Impression:   1. No evidence of thoracic metastasis.   Abdomen / Pelvis Impression:   1. Thickening through the terminal ileum similar to prior. No ileocecal lymphadenopathy. 2. One focus linear thickening the peritoneum adjacent to loops of small bowel in the ventral abdomen which extend to the proximal sigmoid colon are more prominent than prior and concerning for residual peritoneal carcinoma. 3. No evidence of solid organ metastasis in the abdomen pelvis.      REVIEW OF SYSTEMS:   Review of Systems  Constitutional:  Positive for malaise/fatigue. Negative for chills and fever.  HENT: Negative.    Eyes: Negative.   Respiratory: Negative.    Cardiovascular: Negative.   Gastrointestinal:  Positive for abdominal pain and constipation.  Genitourinary: Negative.   Musculoskeletal: Negative.   Skin: Negative.   Neurological: Negative.   Endo/Heme/Allergies: Negative.    Psychiatric/Behavioral: Negative.     I have reviewed the past medical history, past surgical history, social history and family history with the patient and they are unchanged from previous note.   PHYSICAL EXAMINATION: ECOG PERFORMANCE STATUS: 2 - Symptomatic, <50% confined to bed  Vitals:   09/17/21 2215 09/18/21 0438  BP: (!) 153/93 (!) 143/98  Pulse: 74 73  Resp: 19 19  Temp: 98 F (36.7 C) (!) 97.5 F (36.4 C)  SpO2: 94% 95%   Filed Weights   09/14/21 1058 09/14/21 1847  Weight: 117.8 kg 117.8 kg    Intake/Output from previous day: 01/04 0701 - 01/05 0700 In: 1799.2 [I.V.:1599.2; IV Piggyback:200] Out: 575 [Urine:350; Drains:225]  Physical Exam Vitals reviewed.  Constitutional:      General: He is not in acute distress. HENT:     Head: Normocephalic.  Cardiovascular:     Rate and Rhythm: Normal rate and regular rhythm.  Pulmonary:     Effort: Pulmonary effort is normal.     Breath sounds: Normal breath sounds.  Abdominal:     General: Bowel sounds are decreased. There is distension.     Tenderness: There is generalized abdominal tenderness.  Skin:    General: Skin is warm and dry.  Neurological:     Mental Status: He is alert and oriented to person, place, and time.    LABORATORY DATA:  I have reviewed the data as listed CMP Latest Ref Rng & Units 09/18/2021 09/16/2021 09/15/2021  Glucose 70 - 99  mg/dL 108(H) 95 161(H)  BUN 6 - 20 mg/dL '13 20 18  ' Creatinine 0.61 - 1.24 mg/dL 0.90 0.81 0.89  Sodium 135 - 145 mmol/L 132(L) 138 139  Potassium 3.5 - 5.1 mmol/L 3.9 4.1 4.4  Chloride 98 - 111 mmol/L 99 108 110  CO2 22 - 32 mmol/L '26 23 24  ' Calcium 8.9 - 10.3 mg/dL 8.0(L) 7.8(L) 8.0(L)  Total Protein 6.5 - 8.1 g/dL 6.5 - 6.3(L)  Total Bilirubin 0.3 - 1.2 mg/dL 0.7 - 0.6  Alkaline Phos 38 - 126 U/L 108 - 88  AST 15 - 41 U/L 19 - 20  ALT 0 - 44 U/L 21 - 19    Lab Results  Component Value Date   WBC 12.6 (H) 09/17/2021   HGB 11.1 (L) 09/17/2021   HCT 34.2  (L) 09/17/2021   MCV 90.5 09/17/2021   PLT 277 09/17/2021   NEUTROABS 11.3 (H) 09/10/2021    Lab Results  Component Value Date   CEA1 1.9 11/29/2020   IOM355 4 11/30/2020    CT ABDOMEN PELVIS WO CONTRAST  Result Date: 08/27/2021 CLINICAL DATA:  Small-bowel obstruction with left lower quadrant pain. Leukocytosis also. Metastatic colon cancer. Ongoing chemotherapy. EXAM: CT ABDOMEN AND PELVIS WITHOUT CONTRAST TECHNIQUE: Multidetector CT imaging of the abdomen and pelvis was performed following the standard protocol without IV contrast. COMPARISON:  The recent CT with IV contrast 08/25/2021, CT abdomen and pelvis with IV and oral contrast 07/01/2021. FINDINGS: Lower chest: There is slight increased prominence of a few scattered epipericardial lymph nodes compared with 07/01/2021. No acute lung base findings. Hepatobiliary: No focal liver abnormality is seen. No gallstones, gallbladder wall thickening, or biliary dilatation. Pancreas: Unremarkable. No pancreatic ductal dilatation or surrounding inflammatory changes. Spleen: Unremarkable without contrast. Adrenals/Urinary Tract: No focal abnormality in the adrenal glands and renal cortex. No urinary stones or obstruction. Chronic perinephric stranding is similar. Normal bladder thickness. Stomach/Bowel: There is increasing dilatation of left upper to mid abdominal small bowel and anterior central small bowel up to 5 cm, although there is still relative decompressed appearance in the stomach and duodenum. The dilatation begins to be seen distal to the ligament of Treitz. The exact transition point could not be found but I suspect is probably related to abdominal wall adhesive disease with multiple small bowel segments closely abutting the anterior wall on multiple slices. The transitional segment is probably in the right mid to lower abdomen anteriorly. The appendix surgically absent. Rest of the small bowel is decompressed, some of it apparently having  been removed. The large intestine wall is unremarkable except for uncomplicated diverticula along the left segments. Vascular/Lymphatic: There is mild aortoiliac calcific plaque. No enlarged lymph nodes are seen. There is a stable subcentimeter to borderline sized ileocolic mesenteric lymph node in the right lower abdomen on axial 50. Reproductive: Normal prostate. Other: Diffuse haziness in omentum extends to the subphrenic space on the left. There is increased minimal ascites in the perihepatic space and right anterior abdominal mesenteric folds. There are small inguinal fat hernias. Musculoskeletal: Chronic L5 pars defects and grade 1 L5 spondylolisthesis. No destructive bone lesions. 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 IMPRESSION: 1. Intermediate to high-grade obstruction to approximately the mid small bowel, etiology is probably adhesive disease and the transition is most likely in the anterior right mid to lower abdomen along the abdominal wall. The exact transitional segment could not be located without contrast. 2. Slight increased prominence of a few epipericardial lymph nodes. Small stable  ileocolic mesenteric node. 3. Diffuse omental haziness extending into the left subphrenic space concerning for metastatic disease, with small volume of ascites. No free air. 4. Aortic atherosclerosis.  Additional findings as above. Electronically Signed   By: Telford Nab M.D.   On: 08/27/2021 04:19   X-ray chest PA and lateral  Result Date: 09/14/2021 CLINICAL DATA:  56 year old male with a history of colon cancer, recurrent SBO s/p venting gastrostomy tube, who presents to the emergency department today for evaluation of abdominal pain. Of note, patient recently admitted to the hospital for recurrent SBO and had a gastrostomy tube placed. He was actually discharged from the hospital yesterday. Since then he states he has been draining his PEG tube and he continues to have a significant  amount of output so he has not drained in several hours. He is now complaining of severe abdominal pain, distention, nausea vomiting. EXAM: CHEST - 2 VIEW COMPARISON:  01/04/2021. FINDINGS: Increased opacity at the left lung base compared to the prior exam, consistent with atelectasis, pneumonia or a combination. Remainder of the lungs is clear. Small left pleural effusion.  No pneumothorax. Cardiac silhouette normal in size. No mediastinal or hilar masses or evidence of adenopathy. Stable right internal jugular Port-A-Cath. Skeletal structures are grossly intact. IMPRESSION: 1. Left lower lung opacity consistent with a combination of atelectasis/pneumonia and a small effusion, new compared to the prior chest radiograph. Electronically Signed   By: Lajean Manes M.D.   On: 09/14/2021 10:59   Abd 1 View (KUB)  Result Date: 09/14/2021 CLINICAL DATA:  History of colon cancer with recurrent small bowel obstruction. Patient presents with abdominal pain. EXAM: ABDOMEN - 1 VIEW COMPARISON:  09/08/21 FINDINGS: Left upper quadrant gastrostomy tube identified. Since the previous exam there is been interval improvement in gaseous distension the of the small and large bowel loops. No new findings. IMPRESSION: Interval improvement in gaseous distension of the small and large bowel loops compared with 09/08/2021. Electronically Signed   By: Kerby Moors M.D.   On: 09/14/2021 10:57   DG Abd 1 View  Result Date: 09/08/2021 CLINICAL DATA:  Abdominal pain, nausea, vomiting EXAM: ABDOMEN - 1 VIEW COMPARISON:  09/07/2021 FINDINGS: Prominent small bowel loops centrally again noted, similar prior study compatible with persistent small bowel obstruction. No free air or organomegaly. Visualized lung bases clear. IMPRESSION: Continued small bowel obstruction pattern, not significantly changed. Electronically Signed   By: Rolm Baptise M.D.   On: 09/08/2021 15:12   DG Abd 1 View  Result Date: 09/07/2021 CLINICAL DATA:  Nausea  and vomiting; EXAM: ABDOMEN - 1 VIEW COMPARISON:  September 04, 2021 ;September 03, 2021; September 02, 2021 FINDINGS: There are a few mildly dilated loops of bowel in the upper abdomen, improved in comparison to prior from September 03, 2021. Interval removal of enteric tube. Partial visualization of CVC tip terminating over the region of the superior cavoatrial junction. No bowel gas visualized in the rectum. IMPRESSION: Persistent mild dilation of several loops of small bowel in the upper abdomen, overall improved since September 03, 2021. This could reflect persistent small-bowel obstruction. Electronically Signed   By: Valentino Saxon M.D.   On: 09/07/2021 14:00   DG Abdomen 1 View  Result Date: 09/03/2021 CLINICAL DATA:  Nasogastric tube placement EXAM: ABDOMEN - 1 VIEW COMPARISON:  None. FINDINGS: Nasogastric tube tip overlies the expected mid body of the stomach. Multiple gas-filled dilated loops of small bowel are seen within the mid abdomen and left upper quadrant. No  free intraperitoneal gas. IMPRESSION: Nasogastric tube tip within the mid body of the stomach. Electronically Signed   By: Fidela Salisbury M.D.   On: 09/03/2021 00:51   CT ABDOMEN PELVIS W CONTRAST  Result Date: 09/02/2021 CLINICAL DATA:  Bowel obstruction suspected. Abdominal pain. Chemotherapy. Three weeks ago. EXAM: CT ABDOMEN AND PELVIS WITH CONTRAST TECHNIQUE: Multidetector CT imaging of the abdomen and pelvis was performed using the standard protocol following bolus administration of intravenous contrast. CONTRAST:  62m OMNIPAQUE IOHEXOL 350 MG/ML SOLN COMPARISON:  CT abdomen and pelvis 08/27/2021. FINDINGS: Lower chest: There is a new trace left pleural effusion. There is atelectasis in the bilateral lung bases. Hepatobiliary: No focal liver abnormality is seen. No gallstones, gallbladder wall thickening, or biliary dilatation. Pancreas: Unremarkable. No pancreatic ductal dilatation or surrounding inflammatory changes. Spleen:  Normal in size without focal abnormality. Adrenals/Urinary Tract: Adrenal glands are unremarkable. Kidneys are normal, without renal calculi, focal lesion, or hydronephrosis. Bladder is unremarkable. Stomach/Bowel: There are dilated mid and proximal small bowel loops with air-fluid levels measuring up to 5 cm. Degree of distention has mildly increased. There is also new moderate air-fluid level within the stomach. Transition point is likely in the mid abdomen where there are multiple small bowel loops demonstrating adhesive disease to the anterior abdominal wall. This finding is unchanged. The colon is nondilated. The appendix is not seen. There is no pneumatosis or free air identified. Vascular/Lymphatic: Aortic atherosclerosis. No enlarged abdominal or pelvic lymph nodes. Nonenlarged cardiophrenic and ileocecal lymph nodes are unchanged. Reproductive: Prostate is unremarkable. Other: There are small fat containing bilateral inguinal hernias. There is a small amount of free fluid in the anterior right abdomen which has mildly increased. There is mesenteric/omental haziness in the left upper quadrant and anterior upper abdomen similar to the prior examination. Musculoskeletal: There are bilateral pars interarticularis defects at L5 without significant listhesis. IMPRESSION: 1. Again seen are findings of small-bowel obstruction. Transition point is likely in the mid abdomen where there are clustered small bowel loops adhered to the anterior abdominal wall. Can not exclude underlying obstructive lesion. Degree of distention has increased when compared to the prior exam. There is now air-fluid level in the stomach. 2. Increasing free fluid in the right abdomen. 3. New small left pleural effusion. 4. Stable diffuse omental haziness is unchanged, concerning for metastatic disease. Electronically Signed   By: ARonney AstersM.D.   On: 09/02/2021 20:30   CT ABDOMEN PELVIS W CONTRAST  Result Date: 08/25/2021 CLINICAL  DATA:  56year old male with history of nausea and vomiting. Suspected bowel obstruction. EXAM: CT ABDOMEN AND PELVIS WITH CONTRAST TECHNIQUE: Multidetector CT imaging of the abdomen and pelvis was performed using the standard protocol following bolus administration of intravenous contrast. CONTRAST:  867mOMNIPAQUE IOHEXOL 350 MG/ML SOLN COMPARISON:  CT of the abdomen and pelvis 07/01/2021. FINDINGS: Lower chest: Tip of central venous catheter terminating at the superior cavoatrial junction. Mild scarring in the posterior aspect of the left upper lobe. Hepatobiliary: No definite suspicious cystic or solid hepatic lesions. No intra or extrahepatic biliary ductal dilatation. Gallbladder is normal in appearance. Pancreas: No pancreatic mass. No pancreatic ductal dilatation. No pancreatic or peripancreatic fluid collections or inflammatory changes. Spleen: Unremarkable. Adrenals/Urinary Tract: Bilateral kidneys and adrenal glands are normal in appearance. No hydroureteronephrosis. Urinary bladder is normal in appearance. Stomach/Bowel: The appearance of the stomach is normal. No pathologic dilatation of small bowel or colon. Status post appendectomy. Mass-like soft tissue thickening in the region of the terminal ileum  best appreciated on axial images 52 and 53 of series 2. Vascular/Lymphatic: Aortic atherosclerosis. No lymphadenopathy noted in the abdomen or pelvis. Prominent but nonenlarged ileocolic lymph node in the right lower quadrant measuring 6 mm in short axis (axial image 50 of series 2), stable compared to the prior study, nonspecific. Reproductive: Prostate gland and seminal vesicles are unremarkable in appearance. Other: There continues to be some mild diffuse haziness throughout the omentum. Scattered volume of trace ascites is noted, most evident in the right-side of the abdomen (axial image 43 of series 2). No large peritoneal soft tissue masses are confidently identified. No pneumoperitoneum.  Musculoskeletal: There are no aggressive appearing lytic or blastic lesions noted in the visualized portions of the skeleton. IMPRESSION: 1. No findings to suggest bowel obstruction. 2. However, there are findings suggestive of intraperitoneal metastatic disease, including residual small volume of ascites and diffuse haziness throughout the omentum, which has increased slightly compared to the prior examination. 3. Aortic atherosclerosis. 4. Additional incidental findings, as above. Electronically Signed   By: Vinnie Langton M.D.   On: 08/25/2021 07:36   IR GASTROSTOMY TUBE MOD SED  Result Date: 09/11/2021 CLINICAL DATA:  History of metastatic colon carcinoma with persistent small-bowel obstruction and request to place a venting gastrostomy tube for symptomatic relief. EXAM: PERCUTANEOUS GASTROSTOMY TUBE PLACEMENT ANESTHESIA/SEDATION: Moderate (conscious) sedation was employed during this procedure. A total of Versed 4.0 mg and Fentanyl 100 mcg was administered intravenously by radiology nursing. Moderate Sedation Time: 15 minutes. The patient's level of consciousness and vital signs were monitored continuously by radiology nursing throughout the procedure under my direct supervision. CONTRAST:  43m OMNIPAQUE IOHEXOL 300 MG/ML  SOLN MEDICATIONS: 2 g IV Ancef. IV antibiotic was administered in an appropriate time interval prior to needle puncture of the skin. During the procedure the patient received 0.5 mg IV glucagon. FLUOROSCOPY TIME:  2 minutes and 24 seconds.  51.0 mGy. PROCEDURE: The procedure, risks, benefits, and alternatives were explained to the patient. Questions regarding the procedure were encouraged and answered. The patient understands and consents to the procedure. A time-out was performed prior to initiating the procedure. A 5-French catheter was advanced through the patient's mouth under fluoroscopy into the esophagus and to the level of the stomach. This catheter was used to insufflate the  stomach with air under fluoroscopy. The abdominal wall was prepped with chlorhexidine in a sterile fashion, and a sterile drape was applied covering the operative field. A sterile gown and sterile gloves were used for the procedure. Local anesthesia was provided with 1% Lidocaine. A skin incision was made in the upper abdominal wall. Under fluoroscopy, an 18 gauge trocar needle was advanced into the stomach. Contrast injection was performed to confirm intraluminal position of the needle tip. A single T tack was then deployed in the lumen of the stomach. This was brought up to tension at the skin surface. Over a guidewire, a 9-French sheath was advanced into the lumen of the stomach. The wire was left in place as a safety wire. A loop snare device from a percutaneous gastrostomy kit was then advanced into the stomach. A floppy guide wire was advanced through the orogastric catheter under fluoroscopy in the stomach. The loop snare advanced through the percutaneous gastric access was used to snare the guide wire. This allowed withdrawal of the loop snare out of the patient's mouth by retraction of the orogastric catheter and wire. A 20-French bumper retention gastrostomy tube was looped around the snare device. It was then  pulled back through the patient's mouth. The retention bumper was brought up to the anterior gastric wall. The T tack suture was cut at the skin. The exiting gastrostomy tube was cut to appropriate length and a feeding adapter applied. The catheter was injected with contrast material to confirm position and a fluoroscopic spot image saved. The tube was then flushed with saline. A dressing was applied over the gastrostomy exit site. COMPLICATIONS: None. FINDINGS: The stomach distended well with air allowing safe placement of the gastrostomy tube. After placement, the tip of the gastrostomy tube lies in the body of the stomach. IMPRESSION: Percutaneous gastrostomy with placement of a 20-French bumper  retention tube in the body of the stomach. Electronically Signed   By: Aletta Edouard M.D.   On: 09/11/2021 16:11   DG ABD ACUTE 2+V W 1V CHEST  Result Date: 08/27/2021 CLINICAL DATA:  Metastatic colon cancer. Abdominal pain left lower quadrant with vomiting. EXAM: DG ABDOMEN ACUTE WITH 1 VIEW CHEST COMPARISON:  CT with IV contrast 08/25/2021 FINDINGS: There is increased dilatation of left mid to lower abdominal small bowel up to 5 cm concerning for small bowel obstruction. Small amount of scattered gas and stool remain present in the colon. There are stable visceral shadows. There is no evidence of free air. No pathologic calcifications. The lungs are generally clear aside from perihilar linear atelectatic changes on the left. No pleural effusion is seen. The cardiac size is normal. Right IJ port catheter tip remains at the cavoatrial junction. IMPRESSION: Increased dilatation of the left abdominal small bowel up to 5 cm concerning for small bowel obstruction. In all other respects no further changes. Electronically Signed   By: Telford Nab M.D.   On: 08/27/2021 02:38   DG Abd Portable 1V-Small Bowel Obstruction Protocol-24 hr delay  Result Date: 09/04/2021 CLINICAL DATA:  Small-bowel obstruction. Bowel obstruction protocol/24 delay image. EXAM: PORTABLE ABDOMEN - 1 VIEW COMPARISON:  Radiographs 09/03/2021 and 08/27/2021.  CT 09/02/2021. FINDINGS: 1120 hours. Two views obtained. Tip of the nasogastric tube projects over the distal stomach. The enteric contrast has passed into the colon which appears decompressed. Previously noted small bowel distension is improved. No extraluminal contrast or air collections are identified. Telemetry leads overlie the chest and upper abdomen. IMPRESSION: Antegrade passage of contrast into decompressed colon with improved small bowel dilatation. No evidence of bowel obstruction or perforation. Electronically Signed   By: Richardean Sale M.D.   On: 09/04/2021 13:43    DG Abd Portable 1V-Small Bowel Obstruction Protocol-initial, 8 hr delay  Result Date: 09/03/2021 CLINICAL DATA:  Small-bowel obstruction. EXAM: PORTABLE ABDOMEN - 1 VIEW COMPARISON:  09/03/2021 FINDINGS: Nasogastric tube overlies expected mid body of the stomach. Contrast is seen within the gastric fundus. There has been little antegrade passage of contrast into the remainder of the abdomen. Multiple loops of gas-filled dilated small bowel are seen within the epigastrium and left abdomen in keeping with changes of a mid small bowel obstruction. No gross free intraperitoneal gas. No organomegaly. IMPRESSION: No significant antegrade passage of contrast from the gastric lumen. Persistent findings in keeping with a mid small bowel obstruction. Electronically Signed   By: Fidela Salisbury M.D.   On: 09/03/2021 19:48   DG Abd Portable 1V-Small Bowel Obstruction Protocol-initial, 8 hr delay  Result Date: 08/27/2021 CLINICAL DATA:  Small bowel obstruction.  History of colon cancer. EXAM: PORTABLE ABDOMEN - 1 VIEW COMPARISON:  Same day. FINDINGS: Residual contrast is seen through the nondilated colon. Mildly dilated small  bowel loops are noted in the upper abdomen concerning for ileus or distal small bowel obstruction. IMPRESSION: Mildly dilated small bowel loops are again noted in upper abdomen concerning for distal small bowel obstruction. Residual contrast is noted in nondilated colon. Electronically Signed   By: Marijo Conception M.D.   On: 08/27/2021 21:41     No future appointments.     LOS: 3 days   Mikey Bussing, DNP, AGPCNP-BC, AOCNP 09/18/21   Addendum  I have seen the patient, examined him. I agree with the assessment and and plan and have edited the notes.   Pt is clinically stable, restarted chemo [id number] with dose reduction today due to his cancer progression, which caused persistent small bowel obstruction.  Due to his previous allergy reaction to oxaliplatin, we will use maximal  premedication and slow infusion over 4 hours today.  He has not had a bowel movement for a few days, will try suppository Dulcolax. He will have a PICC line placed today for TPN. We will continue to follow.  [name]  09/18/2021

## 2021-09-19 DIAGNOSIS — Z452 Encounter for adjustment and management of vascular access device: Secondary | ICD-10-CM

## 2021-09-19 DIAGNOSIS — R109 Unspecified abdominal pain: Secondary | ICD-10-CM | POA: Diagnosis not present

## 2021-09-19 LAB — BASIC METABOLIC PANEL
Anion gap: 6 (ref 5–15)
BUN: 21 mg/dL — ABNORMAL HIGH (ref 6–20)
CO2: 22 mmol/L (ref 22–32)
Calcium: 8.4 mg/dL — ABNORMAL LOW (ref 8.9–10.3)
Chloride: 103 mmol/L (ref 98–111)
Creatinine, Ser: 0.87 mg/dL (ref 0.61–1.24)
GFR, Estimated: >60 mL/min (ref >60)
Glucose, Bld: 109 mg/dL — ABNORMAL HIGH (ref 70–99)
Potassium: 4.7 mmol/L (ref 3.5–5.1)
Sodium: 131 mmol/L — ABNORMAL LOW (ref 135–145)

## 2021-09-19 LAB — MAGNESIUM: Magnesium: 2.1 mg/dL (ref 1.7–2.4)

## 2021-09-19 LAB — GLUCOSE, CAPILLARY
Glucose-Capillary: 146 mg/dL — ABNORMAL HIGH (ref 70–99)
Glucose-Capillary: 113 mg/dL — ABNORMAL HIGH (ref 70–99)
Glucose-Capillary: 97 mg/dL (ref 70–99)
Glucose-Capillary: 143 mg/dL — ABNORMAL HIGH (ref 70–99)
Glucose-Capillary: 115 mg/dL — ABNORMAL HIGH (ref 70–99)

## 2021-09-19 LAB — PHOSPHORUS: Phosphorus: 3.9 mg/dL (ref 2.5–4.6)

## 2021-09-19 MED ORDER — HYDROMORPHONE HCL 4 MG PO TABS
6.0000 mg | ORAL_TABLET | ORAL | Status: DC | PRN
  Administered 2021-09-19 – 2021-09-24 (×28): 6 mg via ORAL
  Filled 2021-09-19 (×32): qty 1

## 2021-09-19 MED ORDER — HYDROMORPHONE HCL 4 MG PO TABS: 4.0000 mg | ORAL_TABLET | ORAL | Status: DC | PRN

## 2021-09-19 MED ORDER — TRAVASOL 10 % IV SOLN
INTRAVENOUS | Status: AC
  Filled 2021-09-19: qty 1254

## 2021-09-19 NOTE — Progress Notes (Addendum)
HEMATOLOGY-ONCOLOGY PROGRESS NOTE  ASSESSMENT AND PLAN: 1.  Metastatic colon cancer with peritoneal metastasis 2.  Abdominal pain, nausea, vomiting 3.  Aspiration pneumonia 4.  Protein calorie malnutrition 5.  Goals of care discussion  -We discussed that abdominal symptoms are likely related to disease progression from his metastatic malignancy.  The patient is aware of the incurable nature of his malignancy and that additional chemotherapy may not be fully effective in treating his symptoms.  He restarted systemic chemotherapy with FOLFIRINOX on 09/18/2021.  He tolerated it well overall.  The oxaliplatin was given with increased premedications and infused over 4 hours.  He did not have an infusion reaction.  5-FU continues to infuse.  Overall, he feels that the chemotherapy is improving his symptoms.  We will plan for his next cycle of FOLFIRINOX in about 2 weeks as an outpatient. -Has ongoing abdominal pain.  Fentanyl patch was increased to 100 mcg on 09/18/2021.  He continues on IV and oral Dilaudid.  He was encouraged to take oral Dilaudid before taking IV so that we can adjust oral pain medications prior to discharge.  Palliative care is following. -He will continue on TPN for his protein calorie malnutrition.  PICC line has been placed.  SUBJECTIVE: He reports that he feels better this morning overall.  Tolerated chemotherapy well without any infusion reaction.  He was able to walk in the hallway.  He is now having some increased abdominal pain after walking.  He is try to take more oral Dilaudid.  Fentanyl patch was increased to 100 mcg yesterday.  Not having any nausea or vomiting this morning.  States that his bowels still are not moving but he has had some flatus this morning.  Oncology History Overview Note  Cancer Staging metastatic cecal cancer Staging form: Colon and Rectum, AJCC 8th Edition - Clinical stage from 11/29/2020: Stage IVC (cTX, cN2, pM1c) - Signed by [name], MD on  12/04/2020 Stage prefix: Initial diagnosis Histologic grade (G): G3 Histologic grading system: 4 grade system    metastatic cecal cancer  11/27/2020 Imaging   CT Angio CAP  IMPRESSION: 1. Thickening of the terminal ileum with associated proximal mid to distal small bowel obstruction. Findings could be due to an ileitis versus malignancy. Nonspecific mesenteric edema could be due to engorgement versus metastases. No associated bowel perforation. Recommend endoscopy for further evaluation. 2. Indeterminate right lower quadrant lymphadenopathy. 3. Scattered colonic diverticulosis with no acute diverticulitis. 4. Stable right hepatic lobe subcentimeter hyperdensity likely represents a hepatic hemangioma. 5. No acute vascular abnormality. Aortic Atherosclerosis (ICD10-I70.0) - mild. 6. No acute intrathoracic abnormality.   11/28/2020 Imaging   CT AP  IMPRESSION: 1. There is masslike, circumferential thickening of the terminal ileum and cecal base near the ileocecal valve and abnormally enlarged lymph nodes in the right lower quadrant mesentery adjacent to the terminal ileum measuring up to 2.3 x 1.4 cm. 2. There is extensive omental and peritoneal nodularity and caking throughout the abdomen. 3. Findings are highly concerning for primary colon malignancy with nodal and peritoneal metastatic disease. 4. Small volume perihepatic and perisplenic ascites. 5. The small bowel is generally decompressed, with full some fluid-filled, nondistended loops throughout. There is transit of oral enteric contrast to the terminal ileum. No evidence of overt bowel obstruction at this time. Esophagogastric tube is position with tip and side port below the diaphragm. 6. Atelectasis or consolidation of the dependent bilateral lung bases, new compared to prior examination.   Aortic Atherosclerosis (ICD10-I70.0).  11/29/2020 Surgery   EXPLORATORY LAPAROTOMY, PARTIAL BOWEL RESECTION, POSSIBLE OSTOMY  CREATION, PERITIONEAL BIOPSY, INSERTION OF GASTROSTOMY TUBE by Dr [name]   11/29/2020 Initial Biopsy   FINAL MICROSCOPIC DIAGNOSIS:   AB. 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    COMMENT:   Immunohistochemistry (IHC) for CK20 and CDX-2 is strong and diffusely  positive.  CK7, TTF-1, Synaptophysin, Chromogranin and CD56 are  negative.  The immunophenotype is compatible with origin from the lower  gastrointestinal tract.  IHC for MMR will be reported separately.  Case  preliminarily discussed with Dr. [name] on 12/02/2020.   At the request of Dr. [name], [phone] was reviewed in retrospect.  Review of the submitted sections confirms the presence of acute  appendicitis.  No malignancy is identified.    11/29/2020 Cancer Staging   Staging form: Colon and Rectum, AJCC 8th Edition - Clinical stage from 11/29/2020: Stage IVC (cTX, cN2, pM1c) - Signed by [name], MD on 12/04/2020 Stage prefix: Initial diagnosis Histologic grade (G): G3 Histologic grading system: 4 grade system   12/04/2020 Initial Diagnosis   Cancer of ascending colon metastatic to intra-abdominal lymph node ([place])    Chemotherapy   FOLFOX q2weeks    12/18/2020 - 02/15/2021 Chemotherapy      Patient is on Antibody Plan: COLORECTAL BEVACIZUMAB Q14D    01/03/2021 Imaging   CT A/P IMPRESSION: 1. Wall thickening of the terminal ileum again seen. Fluid-filled distal small bowel, ascending, and transverse colon without obstruction. 2. Equivocal improvement in omental and peritoneal metastatic disease with slightly improved small volume perihepatic and perisplenic ascites. Right lower quadrant mesenteric adenopathy is similar or mildly improved. 3. Sigmoid colonic diverticulosis. Mild mural wall thickening in the sigmoid, no acute diverticulitis. 4. Gastrostomy tube in place, balloon normally positioned in the stomach. 5.  Chronic bilateral L5 pars interarticularis defects with trace anterolisthesis of L5 on S1. Chronic avascular necrosis of the left femoral head.   01/16/2021 Genetic Testing   Negative genetic testing. CTNNA1 [id number] VUS, RAD51D p.G140E VUS and TSC1 p.R768H VUS found on the CancerNext-Expanded+RNAinsight.  The CancerNext-Expanded gene panel offered by [facility] and includes sequencing and rearrangement analysis for the following 77 genes: AIP, ALK, APC*, ATM*, AXIN2, BAP1, BARD1, BLM, BMPR1A, BRCA1*, BRCA2*, BRIP1*, CDC73, CDH1*, CDK4, CDKN1B, CDKN2A, CHEK2*, CTNNA1, DICER1, FANCC, FH, FLCN, GALNT12, KIF1B, LZTR1, MAX, MEN1, MET, MLH1*, MSH2*, MSH3, MSH6*, MUTYH*, NBN, NF1*, NF2, NTHL1, PALB2*, PHOX2B, PMS2*, POT1, PRKAR1A, PTCH1, PTEN*, RAD51C*, RAD51D*, RB1, RECQL, RET, SDHA, SDHAF2, SDHB, SDHC, SDHD, SMAD4, SMARCA4, SMARCB1, SMARCE1, STK11, SUFU, TMEM127, TP53*, TSC1, TSC2, VHL and XRCC2 (sequencing and deletion/duplication); EGFR, EGLN1, HOXB13, KIT, MITF, PDGFRA, POLD1, and POLE (sequencing only); EPCAM and GREM1 (deletion/duplication only). DNA and RNA analyses performed for * genes. The report date is Jan 16, 2021.   02/24/2021 Imaging   CT AP  IMPRESSION: 1. Interval resolution of previously seen small volume ascites throughout the abdomen and pelvis. There is redemonstrated, ill-defined stranding and thickening of the peritoneal surfaces and omentum, which is somewhat diminished compared to prior examination. 2. Interval improvement in right lower quadrant mesocolon lymph nodes. 3. Findings are consistent with treatment response of nodal and peritoneal metastatic disease. 4. Unchanged, matted appearing thickening of the terminal ileum and cecal base. 5. Sigmoid diverticulosis with wall thickening of the mid to distal sigmoid, similar to prior examination. Findings are most suggestive of chronic sequelae of diverticulitis.   Aortic Atherosclerosis (ICD10-I70.0).  03/13/2021 -  Chemotherapy   Patient is on Treatment Plan : COLORECTAL FOLFOXIRI + Bevacizumab q14d     05/13/2021 Procedure   OPERATIVE PROCEDURE: Laparoscopy and peritoneal biopsy.  SURGEON: Merlyn Albert. Clovis Riley, MD   05/13/2021 Pathology Results   Final Pathologic Diagnosis    A. PERITONEAL, BIOPSY :              Metastatic adenocarcinoma.               See comment.    Comment    The biopsies show predominately fibrosis  and fibroadipose tissue with a small focus of moderately differentiated adenocarcinoma. Given the patient's history, the findings favor metastasis of the patient's known colonic cancer.     07/01/2021 Imaging   CT CAP  IMPRESSION: Chest Impression:   1. No evidence of thoracic metastasis.   Abdomen / Pelvis Impression:   1. Thickening through the terminal ileum similar to prior. No ileocecal lymphadenopathy. 2. One focus linear thickening the peritoneum adjacent to loops of small bowel in the ventral abdomen which extend to the proximal sigmoid colon are more prominent than prior and concerning for residual peritoneal carcinoma. 3. No evidence of solid organ metastasis in the abdomen pelvis.      REVIEW OF SYSTEMS:   Review of Systems  Constitutional:  Positive for malaise/fatigue. Negative for chills and fever.  HENT: Negative.    Eyes: Negative.   Respiratory: Negative.    Cardiovascular: Negative.   Gastrointestinal:  Positive for abdominal pain and constipation.  Genitourinary: Negative.   Musculoskeletal: Negative.   Skin: Negative.   Neurological: Negative.   Endo/Heme/Allergies: Negative.   Psychiatric/Behavioral: Negative.     I have reviewed the past medical history, past surgical history, social history and family history with the patient and they are unchanged from previous note.   PHYSICAL EXAMINATION: ECOG PERFORMANCE STATUS: 2 - Symptomatic, <50% confined to bed  Vitals:   09/18/21 2204 09/19/21 0448  BP: 140/82 135/89   Pulse: 100 88  Resp: 18 18  Temp: 97.6 F (36.4 C) (!) 97.5 F (36.4 C)  SpO2: 98% 96%   Filed Weights   09/14/21 1058 09/14/21 1847  Weight: 117.8 kg 117.8 kg    Intake/Output from previous day: 01/05 0701 - 01/06 0700 In: 3050 [P.O.:180; I.V.:2407.9; IV Piggyback:462.2] Out: 525 [Drains:525]  Physical Exam Vitals reviewed.  Constitutional:      General: He is not in acute distress. HENT:     Head: Normocephalic.  Cardiovascular:     Rate and Rhythm: Normal rate and regular rhythm.  Pulmonary:     Effort: Pulmonary effort is normal.     Breath sounds: Normal breath sounds.  Abdominal:     General: Bowel sounds are decreased. There is distension.     Tenderness: There is generalized abdominal tenderness.  Skin:    General: Skin is warm and dry.  Neurological:     Mental Status: He is alert and oriented to person, place, and time.    LABORATORY DATA:  I have reviewed the data as listed CMP Latest Ref Rng & Units 09/19/2021 09/18/2021 09/16/2021  Glucose 70 - 99 mg/dL 109(H) 108(H) 95  BUN 6 - 20 mg/dL 21(H) 13 20  Creatinine 0.61 - 1.24 mg/dL 0.87 0.90 0.81  Sodium 135 - 145 mmol/L 131(L) 132(L) 138  Potassium 3.5 - 5.1 mmol/L 4.7 3.9 4.1  Chloride 98 - 111 mmol/L 103 99 108  CO2 22 - 32 mmol/L 22 26 23  Calcium 8.9 - 10.3 mg/dL 8.4(L) 8.0(L) 7.8(L)  Total Protein 6.5 - 8.1 g/dL - 6.5 -  Total Bilirubin 0.3 - 1.2 mg/dL - 0.7 -  Alkaline Phos 38 - 126 U/L - 108 -  AST 15 - 41 U/L - 19 -  ALT 0 - 44 U/L - 21 -    Lab Results  Component Value Date   WBC 12.6 (H) 09/17/2021   HGB 11.1 (L) 09/17/2021   HCT 34.2 (L) 09/17/2021   MCV 90.5 09/17/2021   PLT 277 09/17/2021   NEUTROABS 11.3 (H) 09/10/2021    Lab Results  Component Value Date   CEA1 1.9 11/29/2020   UJW119 4 11/30/2020    CT ABDOMEN PELVIS WO CONTRAST  Result Date: 08/27/2021 CLINICAL DATA:  Small-bowel obstruction with left lower quadrant pain. Leukocytosis also. Metastatic colon cancer.  Ongoing chemotherapy. EXAM: CT ABDOMEN AND PELVIS WITHOUT CONTRAST TECHNIQUE: Multidetector CT imaging of the abdomen and pelvis was performed following the standard protocol without IV contrast. COMPARISON:  The recent CT with IV contrast 08/25/2021, CT abdomen and pelvis with IV and oral contrast 07/01/2021. FINDINGS: Lower chest: There is slight increased prominence of a few scattered epipericardial lymph nodes compared with 07/01/2021. No acute lung base findings. Hepatobiliary: No focal liver abnormality is seen. No gallstones, gallbladder wall thickening, or biliary dilatation. Pancreas: Unremarkable. No pancreatic ductal dilatation or surrounding inflammatory changes. Spleen: Unremarkable without contrast. Adrenals/Urinary Tract: No focal abnormality in the adrenal glands and renal cortex. No urinary stones or obstruction. Chronic perinephric stranding is similar. Normal bladder thickness. Stomach/Bowel: There is increasing dilatation of left upper to mid abdominal small bowel and anterior central small bowel up to 5 cm, although there is still relative decompressed appearance in the stomach and duodenum. The dilatation begins to be seen distal to the ligament of Treitz. The exact transition point could not be found but I suspect is probably related to abdominal wall adhesive disease with multiple small bowel segments closely abutting the anterior wall on multiple slices. The transitional segment is probably in the right mid to lower abdomen anteriorly. The appendix surgically absent. Rest of the small bowel is decompressed, some of it apparently having been removed. The large intestine wall is unremarkable except for uncomplicated diverticula along the left segments. Vascular/Lymphatic: There is mild aortoiliac calcific plaque. No enlarged lymph nodes are seen. There is a stable subcentimeter to borderline sized ileocolic mesenteric lymph node in the right lower abdomen on axial 50. Reproductive: Normal  prostate. Other: Diffuse haziness in omentum extends to the subphrenic space on the left. There is increased minimal ascites in the perihepatic space and right anterior abdominal mesenteric folds. There are small inguinal fat hernias. Musculoskeletal: Chronic L5 pars defects and grade 1 L5 spondylolisthesis. No destructive bone lesions. 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 IMPRESSION: 1. Intermediate to high-grade obstruction to approximately the mid small bowel, etiology is probably adhesive disease and the transition is most likely in the anterior right mid to lower abdomen along the abdominal wall. The exact transitional segment could not be located without contrast. 2. Slight increased prominence of a few epipericardial lymph nodes. Small stable ileocolic mesenteric node. 3. Diffuse omental haziness extending into the left subphrenic space concerning for metastatic disease, with small volume of ascites. No free air. 4. Aortic atherosclerosis.  Additional findings as above. Electronically Signed   By: Telford Nab M.D.   On: 08/27/2021 04:19   X-ray chest PA and lateral  Result Date: 09/14/2021 CLINICAL  DATA:  56 year old male with a history of colon cancer, recurrent SBO s/p venting gastrostomy tube, who presents to the emergency department today for evaluation of abdominal pain. Of note, patient recently admitted to the hospital for recurrent SBO and had a gastrostomy tube placed. He was actually discharged from the hospital yesterday. Since then he states he has been draining his PEG tube and he continues to have a significant amount of output so he has not drained in several hours. He is now complaining of severe abdominal pain, distention, nausea vomiting. EXAM: CHEST - 2 VIEW COMPARISON:  01/04/2021. FINDINGS: Increased opacity at the left lung base compared to the prior exam, consistent with atelectasis, pneumonia or a combination. Remainder of the lungs is clear. Small left  pleural effusion.  No pneumothorax. Cardiac silhouette normal in size. No mediastinal or hilar masses or evidence of adenopathy. Stable right internal jugular Port-A-Cath. Skeletal structures are grossly intact. IMPRESSION: 1. Left lower lung opacity consistent with a combination of atelectasis/pneumonia and a small effusion, new compared to the prior chest radiograph. Electronically Signed   By: Lajean Manes M.D.   On: 09/14/2021 10:59   Abd 1 View (KUB)  Result Date: 09/14/2021 CLINICAL DATA:  History of colon cancer with recurrent small bowel obstruction. Patient presents with abdominal pain. EXAM: ABDOMEN - 1 VIEW COMPARISON:  09/08/21 FINDINGS: Left upper quadrant gastrostomy tube identified. Since the previous exam there is been interval improvement in gaseous distension the of the small and large bowel loops. No new findings. IMPRESSION: Interval improvement in gaseous distension of the small and large bowel loops compared with 09/08/2021. Electronically Signed   By: Kerby Moors M.D.   On: 09/14/2021 10:57   DG Abd 1 View  Result Date: 09/08/2021 CLINICAL DATA:  Abdominal pain, nausea, vomiting EXAM: ABDOMEN - 1 VIEW COMPARISON:  09/07/2021 FINDINGS: Prominent small bowel loops centrally again noted, similar prior study compatible with persistent small bowel obstruction. No free air or organomegaly. Visualized lung bases clear. IMPRESSION: Continued small bowel obstruction pattern, not significantly changed. Electronically Signed   By: Rolm Baptise M.D.   On: 09/08/2021 15:12   DG Abd 1 View  Result Date: 09/07/2021 CLINICAL DATA:  Nausea and vomiting; EXAM: ABDOMEN - 1 VIEW COMPARISON:  September 04, 2021 ;September 03, 2021; September 02, 2021 FINDINGS: There are a few mildly dilated loops of bowel in the upper abdomen, improved in comparison to prior from September 03, 2021. Interval removal of enteric tube. Partial visualization of CVC tip terminating over the region of the superior  cavoatrial junction. No bowel gas visualized in the rectum. IMPRESSION: Persistent mild dilation of several loops of small bowel in the upper abdomen, overall improved since September 03, 2021. This could reflect persistent small-bowel obstruction. Electronically Signed   By: Valentino Saxon M.D.   On: 09/07/2021 14:00   DG Abdomen 1 View  Result Date: 09/03/2021 CLINICAL DATA:  Nasogastric tube placement EXAM: ABDOMEN - 1 VIEW COMPARISON:  None. FINDINGS: Nasogastric tube tip overlies the expected mid body of the stomach. Multiple gas-filled dilated loops of small bowel are seen within the mid abdomen and left upper quadrant. No free intraperitoneal gas. IMPRESSION: Nasogastric tube tip within the mid body of the stomach. Electronically Signed   By: Fidela Salisbury M.D.   On: 09/03/2021 00:51   CT ABDOMEN PELVIS W CONTRAST  Result Date: 09/02/2021 CLINICAL DATA:  Bowel obstruction suspected. Abdominal pain. Chemotherapy. Three weeks ago. EXAM: CT ABDOMEN AND PELVIS WITH CONTRAST TECHNIQUE:  Multidetector CT imaging of the abdomen and pelvis was performed using the standard protocol following bolus administration of intravenous contrast. CONTRAST:  63m OMNIPAQUE IOHEXOL 350 MG/ML SOLN COMPARISON:  CT abdomen and pelvis 08/27/2021. FINDINGS: Lower chest: There is a new trace left pleural effusion. There is atelectasis in the bilateral lung bases. Hepatobiliary: No focal liver abnormality is seen. No gallstones, gallbladder wall thickening, or biliary dilatation. Pancreas: Unremarkable. No pancreatic ductal dilatation or surrounding inflammatory changes. Spleen: Normal in size without focal abnormality. Adrenals/Urinary Tract: Adrenal glands are unremarkable. Kidneys are normal, without renal calculi, focal lesion, or hydronephrosis. Bladder is unremarkable. Stomach/Bowel: There are dilated mid and proximal small bowel loops with air-fluid levels measuring up to 5 cm. Degree of distention has mildly  increased. There is also new moderate air-fluid level within the stomach. Transition point is likely in the mid abdomen where there are multiple small bowel loops demonstrating adhesive disease to the anterior abdominal wall. This finding is unchanged. The colon is nondilated. The appendix is not seen. There is no pneumatosis or free air identified. Vascular/Lymphatic: Aortic atherosclerosis. No enlarged abdominal or pelvic lymph nodes. Nonenlarged cardiophrenic and ileocecal lymph nodes are unchanged. Reproductive: Prostate is unremarkable. Other: There are small fat containing bilateral inguinal hernias. There is a small amount of free fluid in the anterior right abdomen which has mildly increased. There is mesenteric/omental haziness in the left upper quadrant and anterior upper abdomen similar to the prior examination. Musculoskeletal: There are bilateral pars interarticularis defects at L5 without significant listhesis. IMPRESSION: 1. Again seen are findings of small-bowel obstruction. Transition point is likely in the mid abdomen where there are clustered small bowel loops adhered to the anterior abdominal wall. Can not exclude underlying obstructive lesion. Degree of distention has increased when compared to the prior exam. There is now air-fluid level in the stomach. 2. Increasing free fluid in the right abdomen. 3. New small left pleural effusion. 4. Stable diffuse omental haziness is unchanged, concerning for metastatic disease. Electronically Signed   By: ARonney AstersM.D.   On: 09/02/2021 20:30   CT ABDOMEN PELVIS W CONTRAST  Result Date: 08/25/2021 CLINICAL DATA:  56year old male with history of nausea and vomiting. Suspected bowel obstruction. EXAM: CT ABDOMEN AND PELVIS WITH CONTRAST TECHNIQUE: Multidetector CT imaging of the abdomen and pelvis was performed using the standard protocol following bolus administration of intravenous contrast. CONTRAST:  871mOMNIPAQUE IOHEXOL 350 MG/ML SOLN  COMPARISON:  CT of the abdomen and pelvis 07/01/2021. FINDINGS: Lower chest: Tip of central venous catheter terminating at the superior cavoatrial junction. Mild scarring in the posterior aspect of the left upper lobe. Hepatobiliary: No definite suspicious cystic or solid hepatic lesions. No intra or extrahepatic biliary ductal dilatation. Gallbladder is normal in appearance. Pancreas: No pancreatic mass. No pancreatic ductal dilatation. No pancreatic or peripancreatic fluid collections or inflammatory changes. Spleen: Unremarkable. Adrenals/Urinary Tract: Bilateral kidneys and adrenal glands are normal in appearance. No hydroureteronephrosis. Urinary bladder is normal in appearance. Stomach/Bowel: The appearance of the stomach is normal. No pathologic dilatation of small bowel or colon. Status post appendectomy. Mass-like soft tissue thickening in the region of the terminal ileum best appreciated on axial images 52 and 53 of series 2. Vascular/Lymphatic: Aortic atherosclerosis. No lymphadenopathy noted in the abdomen or pelvis. Prominent but nonenlarged ileocolic lymph node in the right lower quadrant measuring 6 mm in short axis (axial image 50 of series 2), stable compared to the prior study, nonspecific. Reproductive: Prostate gland and seminal vesicles are  unremarkable in appearance. Other: There continues to be some mild diffuse haziness throughout the omentum. Scattered volume of trace ascites is noted, most evident in the right-side of the abdomen (axial image 43 of series 2). No large peritoneal soft tissue masses are confidently identified. No pneumoperitoneum. Musculoskeletal: There are no aggressive appearing lytic or blastic lesions noted in the visualized portions of the skeleton. IMPRESSION: 1. No findings to suggest bowel obstruction. 2. However, there are findings suggestive of intraperitoneal metastatic disease, including residual small volume of ascites and diffuse haziness throughout the omentum,  which has increased slightly compared to the prior examination. 3. Aortic atherosclerosis. 4. Additional incidental findings, as above. Electronically Signed   By: Vinnie Langton M.D.   On: 08/25/2021 07:36   IR GASTROSTOMY TUBE MOD SED  Result Date: 09/11/2021 CLINICAL DATA:  History of metastatic colon carcinoma with persistent small-bowel obstruction and request to place a venting gastrostomy tube for symptomatic relief. EXAM: PERCUTANEOUS GASTROSTOMY TUBE PLACEMENT ANESTHESIA/SEDATION: Moderate (conscious) sedation was employed during this procedure. A total of Versed 4.0 mg and Fentanyl 100 mcg was administered intravenously by radiology nursing. Moderate Sedation Time: 15 minutes. The patient's level of consciousness and vital signs were monitored continuously by radiology nursing throughout the procedure under my direct supervision. CONTRAST:  31m OMNIPAQUE IOHEXOL 300 MG/ML  SOLN MEDICATIONS: 2 g IV Ancef. IV antibiotic was administered in an appropriate time interval prior to needle puncture of the skin. During the procedure the patient received 0.5 mg IV glucagon. FLUOROSCOPY TIME:  2 minutes and 24 seconds.  51.0 mGy. PROCEDURE: The procedure, risks, benefits, and alternatives were explained to the patient. Questions regarding the procedure were encouraged and answered. The patient understands and consents to the procedure. A time-out was performed prior to initiating the procedure. A 5-French catheter was advanced through the patient's mouth under fluoroscopy into the esophagus and to the level of the stomach. This catheter was used to insufflate the stomach with air under fluoroscopy. The abdominal wall was prepped with chlorhexidine in a sterile fashion, and a sterile drape was applied covering the operative field. A sterile gown and sterile gloves were used for the procedure. Local anesthesia was provided with 1% Lidocaine. A skin incision was made in the upper abdominal wall. Under  fluoroscopy, an 18 gauge trocar needle was advanced into the stomach. Contrast injection was performed to confirm intraluminal position of the needle tip. A single T tack was then deployed in the lumen of the stomach. This was brought up to tension at the skin surface. Over a guidewire, a 9-French sheath was advanced into the lumen of the stomach. The wire was left in place as a safety wire. A loop snare device from a percutaneous gastrostomy kit was then advanced into the stomach. A floppy guide wire was advanced through the orogastric catheter under fluoroscopy in the stomach. The loop snare advanced through the percutaneous gastric access was used to snare the guide wire. This allowed withdrawal of the loop snare out of the patient's mouth by retraction of the orogastric catheter and wire. A 20-French bumper retention gastrostomy tube was looped around the snare device. It was then pulled back through the patient's mouth. The retention bumper was brought up to the anterior gastric wall. The T tack suture was cut at the skin. The exiting gastrostomy tube was cut to appropriate length and a feeding adapter applied. The catheter was injected with contrast material to confirm position and a fluoroscopic spot image saved. The tube was  then flushed with saline. A dressing was applied over the gastrostomy exit site. COMPLICATIONS: None. FINDINGS: The stomach distended well with air allowing safe placement of the gastrostomy tube. After placement, the tip of the gastrostomy tube lies in the body of the stomach. IMPRESSION: Percutaneous gastrostomy with placement of a 20-French bumper retention tube in the body of the stomach. Electronically Signed   By: Aletta Edouard M.D.   On: 09/11/2021 16:11   DG CHEST PORT 1 VIEW  Result Date: 09/18/2021 CLINICAL DATA:  Left PICC placement EXAM: PORTABLE CHEST 1 VIEW COMPARISON:  09/14/2021 FINDINGS: Single frontal view of the chest demonstrates stable right chest wall port.  Left-sided PICC tip overlies the superior vena cava. Cardiac silhouette is stable. Stable left basilar consolidation. Small effusion is suspected. Right chest is clear. No pneumothorax. IMPRESSION: 1. Left-sided PICC tip overlying superior vena cava. 2. Continued left basilar consolidation and likely small effusion. Electronically Signed   By: Randa Ngo M.D.   On: 09/18/2021 17:32   DG ABD ACUTE 2+V W 1V CHEST  Result Date: 08/27/2021 CLINICAL DATA:  Metastatic colon cancer. Abdominal pain left lower quadrant with vomiting. EXAM: DG ABDOMEN ACUTE WITH 1 VIEW CHEST COMPARISON:  CT with IV contrast 08/25/2021 FINDINGS: There is increased dilatation of left mid to lower abdominal small bowel up to 5 cm concerning for small bowel obstruction. Small amount of scattered gas and stool remain present in the colon. There are stable visceral shadows. There is no evidence of free air. No pathologic calcifications. The lungs are generally clear aside from perihilar linear atelectatic changes on the left. No pleural effusion is seen. The cardiac size is normal. Right IJ port catheter tip remains at the cavoatrial junction. IMPRESSION: Increased dilatation of the left abdominal small bowel up to 5 cm concerning for small bowel obstruction. In all other respects no further changes. Electronically Signed   By: Telford Nab M.D.   On: 08/27/2021 02:38   DG Abd Portable 1V-Small Bowel Obstruction Protocol-24 hr delay  Result Date: 09/04/2021 CLINICAL DATA:  Small-bowel obstruction. Bowel obstruction protocol/24 delay image. EXAM: PORTABLE ABDOMEN - 1 VIEW COMPARISON:  Radiographs 09/03/2021 and 08/27/2021.  CT 09/02/2021. FINDINGS: 1120 hours. Two views obtained. Tip of the nasogastric tube projects over the distal stomach. The enteric contrast has passed into the colon which appears decompressed. Previously noted small bowel distension is improved. No extraluminal contrast or air collections are identified.  Telemetry leads overlie the chest and upper abdomen. IMPRESSION: Antegrade passage of contrast into decompressed colon with improved small bowel dilatation. No evidence of bowel obstruction or perforation. Electronically Signed   By: Richardean Sale M.D.   On: 09/04/2021 13:43   DG Abd Portable 1V-Small Bowel Obstruction Protocol-initial, 8 hr delay  Result Date: 09/03/2021 CLINICAL DATA:  Small-bowel obstruction. EXAM: PORTABLE ABDOMEN - 1 VIEW COMPARISON:  09/03/2021 FINDINGS: Nasogastric tube overlies expected mid body of the stomach. Contrast is seen within the gastric fundus. There has been little antegrade passage of contrast into the remainder of the abdomen. Multiple loops of gas-filled dilated small bowel are seen within the epigastrium and left abdomen in keeping with changes of a mid small bowel obstruction. No gross free intraperitoneal gas. No organomegaly. IMPRESSION: No significant antegrade passage of contrast from the gastric lumen. Persistent findings in keeping with a mid small bowel obstruction. Electronically Signed   By: Fidela Salisbury M.D.   On: 09/03/2021 19:48   DG Abd Portable 1V-Small Bowel Obstruction Protocol-initial, 8 hr delay  Result Date: 08/27/2021 CLINICAL DATA:  Small bowel obstruction.  History of colon cancer. EXAM: PORTABLE ABDOMEN - 1 VIEW COMPARISON:  Same day. FINDINGS: Residual contrast is seen through the nondilated colon. Mildly dilated small bowel loops are noted in the upper abdomen concerning for ileus or distal small bowel obstruction. IMPRESSION: Mildly dilated small bowel loops are again noted in upper abdomen concerning for distal small bowel obstruction. Residual contrast is noted in nondilated colon. Electronically Signed   By: Marijo Conception M.D.   On: 08/27/2021 21:41   [place] EKG Site Rite  Result Date: 09/18/2021 If Site Rite image not attached, placement could not be confirmed due to current cardiac rhythm.  [place] EKG SITE RITE  Result Date:  09/18/2021 If Site Rite image not attached, placement could not be confirmed due to current cardiac rhythm.    No future appointments.     LOS: 4 days   Mikey Bussing, DNP, AGPCNP-BC, AOCNP 09/19/21   Addendum  I have seen the patient, examined him. I agree with the assessment and and plan and have edited the notes.   Dontavis tolerating chemo well yesterday. No allergy reaction. He feels hungry today, continue clear liquid diet, not ready to advance his diet. Appreciate the excellent care from hospitlaist and palliative care team, I encourage him to ambulate, and will continue titrate his oral pain meds, hope he can go home early next week.  [name]  09/19/2021

## 2021-09-19 NOTE — Evaluation (Signed)
Physical Therapy Evaluation Patient Details Name: Ricardo [name] MRN: [id number] DOB: 1966/02/22 Today's Date: 09/19/2021  History of Present Illness  56 y.o. male with PMH of  osteoarthritis, class II obesity, generalized anxiety disorder, hyperlipidemia, unspecified sleep disorder, GERD, colon cancer with metastasis and peritoneal carcinomatosis who was recently admitted and discharged 09/13/21 due to SBO requiring PEG placement for venting purposes who returns to the hospital 09/14/21 due to recurrence of abdominal pain, multiple episodes of nausea, emesis. Dx of aspiration PNA.  Clinical Impression  Pt is mobilizing independently, he ambulated 400' without loss of balance, SpO2 92% on room air. No further PT is indicated, will sign off. Encouraged pt to ambulate several times per day in the hall to minimize deconditioning during hospitalization.         Recommendations for follow up therapy are one component of a multi-disciplinary discharge planning process, led by the attending physician.  Recommendations may be updated based on patient status, additional functional criteria and insurance authorization.  Follow Up Recommendations No PT follow up    Assistance Recommended at Discharge None  Patient can return home with the following       Equipment Recommendations None recommended by PT  Recommendations for Other Services       Functional Status Assessment Patient has not had a recent decline in their functional status     Precautions / Restrictions Precautions Precautions: Other (comment) Precaution Comments: PEG Restrictions Weight Bearing Restrictions: No      Mobility  Bed Mobility Overal bed mobility: Independent                  Transfers Overall transfer level: Independent                      Ambulation/Gait Ambulation/Gait assistance: Independent Gait Distance (Feet): 400 Feet Assistive device: IV Pole Gait Pattern/deviations: WFL(Within  Functional Limits)       General Gait Details: steady with and without IV pole, no loss of balance, SaO2 92% on room air with walking  Stairs            Wheelchair Mobility    Modified Rankin (Stroke Patients Only)       Balance Overall balance assessment: Independent                                           Pertinent Vitals/Pain Pain Assessment: 0-10 Pain Score: 4  Pain Location: abdomen Pain Descriptors / Indicators: Aching Pain Intervention(s): Limited activity within patient's tolerance;Monitored during session    Home Living Family/patient expects to be discharged to:: Private residence Living Arrangements: Spouse/significant other Available Help at Discharge: Family                    Prior Function Prior Level of Function : Independent/Modified Independent             Mobility Comments: rides Insurance account manager        Extremity/Trunk Assessment   Upper Extremity Assessment Upper Extremity Assessment: Overall WFL for tasks assessed    Lower Extremity Assessment Lower Extremity Assessment: Overall WFL for tasks assessed    Cervical / Trunk Assessment Cervical / Trunk Assessment: Normal  Communication   Communication: No difficulties  Cognition Arousal/Alertness: Awake/alert Behavior During Therapy: WFL for tasks assessed/performed Overall Cognitive Status: Within Functional Limits  for tasks assessed                                          General Comments      Exercises     Assessment/Plan    PT Assessment Patient does not need any further PT services  PT Problem List         PT Treatment Interventions      PT Goals (Current goals can be found in the Care Plan section)  Acute Rehab PT Goals Patient Stated Goal: return to riding a bicycle PT Goal Formulation: All assessment and education complete, DC therapy    Frequency       Co-evaluation                AM-PAC PT "6 Clicks" Mobility  Outcome Measure Help needed turning from your back to your side while in a flat bed without using bedrails?: None Help needed moving from lying on your back to sitting on the side of a flat bed without using bedrails?: None Help needed moving to and from a bed to a chair (including a wheelchair)?: None Help needed standing up from a chair using your arms (e.g., wheelchair or bedside chair)?: None Help needed to walk in hospital room?: None Help needed climbing 3-5 steps with a railing? : None 6 Click Score: 24    End of Session   Activity Tolerance: Patient tolerated treatment well Patient left: in bed;with family/visitor present Nurse Communication: Mobility status      Time: 2703-5009 PT Time Calculation (min) (ACUTE ONLY): 10 min   Charges:   PT Evaluation $PT Eval Low Complexity: 1 Low         Philomena Doheny PT 09/19/2021  Acute Rehabilitation Services Pager [phone] Office [phone]

## 2021-09-19 NOTE — Progress Notes (Signed)
PROGRESS NOTE    AHMEER TUMAN  GDJ:[id number] DOB: Nov 23, 1965 DOA: 09/14/2021 PCP: [name], MD   Brief Narrative: [name], 56 y.o. 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 In the ED afebrile mildly tachycardic, labs with leukocytosis likely poor, stable hemoglobin and renal function Lumin low 2.7 Abdominal x-ray showed interval improvement in gaseous distention of the small and large bowel loops compared with 12/26 X-ray showed left lower lung opacity concern about aspiration placed on antibiotics   Assessment & Plan:   Intractable abdominal pain Nausea and vomiting Recurrent issue. Recently reinserted PEG tube placement for venting. Palliative care consulted for pain management. Pain control is improving with analgesics. -Palliative care medicine recommendations: Fentanyl patch, Decadron, Dilaudid PO PRN, Dilaudid IV PRN -Minimize IV narcotics to ensure proper outpatient oral regimen  Aspiration pneumonia In setting of recent vomiting. Started on empiric Unasyn -Continue Unasyn. Treat for 5 days  Metastatic colon cancer Metastasis to multiple sites including peritoneum. Medical oncology consulted. Chemotherapy (FOLFIRINOX) infused on 1/5. PICC line placed on 09/18/21  Anxiety -Continue Xanax prn  GERD -Continue Protonix  Moderate malnutrition TPN started this admission, PICC line now in place. -Continue TPN  Normocytic anemia Mild.  Stable.  Hyperglycemia Hemoglobin A1C of 5.5%  Elevated blood pressure No diagnosis of hypertension -Hydralazine PO prn  Obesity Body mass index is 36.22 kg/m.   DVT prophylaxis: Lovenox Code Status:   Code Status: Partial Code Family Communication: Wife at bedside Disposition Plan: Discharge home vs SNF pending PT eval in addition to medical oncology/palliative care recommendations/management. Medically stable pending adjustments of pain regimen per palliative care medicine.   Consultants:  Medical oncology Palliative care medicine  Procedures:  None  Antimicrobials: Unasyn IV    Subjective: Feeling better today. No issues overnight but did not sleep well.  Objective: Vitals:   09/18/21 1304 09/18/21 1611 09/18/21 2204 09/19/21 0448  BP: (!) 150/92 (!) 148/96 140/82 135/89  Pulse: 74 81 100 88  Resp: 18 18 18 18   Temp: 97.7 F (36.5 C) 97.9 F (36.6 C) 97.6 F (36.4 C) (!) 97.5 F (36.4 C)  TempSrc: Oral Oral Oral Oral  SpO2: 94% 95% 98% 96%  Weight:      Height:        Intake/Output Summary (Last 24 hours) at 09/19/2021 1054 Last data filed at 09/19/2021 0600 Gross per 24 hour  Intake 3050.03 ml  Output 525 ml  Net 2525.03 ml    Filed Weights   09/14/21 1058 09/14/21 1847  Weight: 117.8 kg 117.8 kg    Examination:  General exam: Appears calm and comfortable Respiratory system: Clear to auscultation. Respiratory effort normal. Cardiovascular system: S1 & S2 heard, RRR. No murmurs, rubs, gallops or clicks. Gastrointestinal system: Abdomen is nondistended, soft and tender. Minimal bowel sounds heard. Central nervous system: Alert and oriented. No focal neurological deficits. Musculoskeletal: No edema. No calf tenderness Skin: No cyanosis. No rashes Psychiatry: Judgement and insight appear normal. Mood & affect appropriate.     Data Reviewed: I have personally reviewed following  labs and imaging studies  CBC Lab Results  Component Value Date    WBC 12.6 (H) 09/17/2021   RBC 3.78 (L) 09/17/2021   HGB 11.1 (L) 09/17/2021   HCT 34.2 (L) 09/17/2021   MCV 90.5 09/17/2021   MCH 29.4 09/17/2021   PLT 277 09/17/2021   MCHC 32.5 09/17/2021   RDW 15.1 09/17/2021   LYMPHSABS 0.7 09/10/2021   MONOABS 1.4 (H) 09/10/2021   EOSABS 0.2 09/10/2021   BASOSABS 0.0 21/30/8657     Last metabolic panel Lab Results  Component Value Date   NA 131 (L) 09/19/2021   K 4.7 09/19/2021   CL 103 09/19/2021   CO2 22 09/19/2021   BUN 21 (H) 09/19/2021   CREATININE 0.87 09/19/2021   GLUCOSE 109 (H) 09/19/2021   GFRNONAA >60 09/19/2021   GFRAA 88 09/04/2020   CALCIUM 8.4 (L) 09/19/2021   PHOS 3.9 09/19/2021   PROT 6.5 09/18/2021   ALBUMIN 2.6 (L) 09/18/2021   LABGLOB 2.7 09/04/2020   AGRATIO 1.7 09/04/2020   BILITOT 0.7 09/18/2021   ALKPHOS 108 09/18/2021   AST 19 09/18/2021   ALT 21 09/18/2021   ANIONGAP 6 09/19/2021    CBG (last 3)  Recent Labs    09/18/21 1959 09/18/21 2314 09/19/21 0652  GLUCAP 204* 235* 146*      GFR: Estimated Creatinine Clearance: 123.8 mL/min (by C-G formula based on SCr of 0.87 mg/dL).  Coagulation Profile: No results for input(s): INR, PROTIME in the last 168 hours.  Recent Results (from the past 240 hour(s))  Resp Panel by RT-PCR (Flu A&B, Covid) Nasopharyngeal Swab     Status: None   Collection Time: 09/14/21  7:46 AM   Specimen: Nasopharyngeal Swab; Nasopharyngeal(NP) swabs in vial transport medium  Result Value Ref Range Status   SARS Coronavirus 2 by RT PCR NEGATIVE NEGATIVE Final    Comment: (NOTE) SARS-CoV-2 target nucleic acids are NOT DETECTED.  The SARS-CoV-2 RNA is generally detectable in upper respiratory specimens during the acute phase of infection. The lowest concentration of SARS-CoV-2 viral copies this assay can detect is 138 copies/mL. A negative result does not preclude SARS-Cov-2 infection and should not be used as the sole basis for treatment or other patient management  decisions. A negative result may occur with  improper specimen collection/handling, submission of specimen other than nasopharyngeal swab, presence of viral mutation(s) within the areas targeted by this assay, and inadequate number of viral copies(<138 copies/mL). A negative result must be combined with clinical observations, patient history, and epidemiological information. The expected result is Negative.  Fact Sheet for Patients:  EntrepreneurPulse.com.au  Fact Sheet for Healthcare Providers:  IncredibleEmployment.be  This test is no t yet approved or cleared by the [place] FDA and  has been authorized for detection and/or diagnosis of SARS-CoV-2 by FDA under an Emergency Use Authorization (EUA). This EUA will remain  in effect (meaning this test can be used) for the duration of the COVID-19 declaration under Section 564(b)(1) of the Act, 21 U.S.C.section 360bbb-3(b)(1), unless the authorization is terminated  or revoked sooner.       Influenza A by PCR NEGATIVE NEGATIVE Final   Influenza B by PCR NEGATIVE NEGATIVE Final    Comment: (NOTE) The Xpert Xpress SARS-CoV-2/FLU/RSV plus assay is intended as an aid in the diagnosis of influenza from Nasopharyngeal swab specimens and should not be used as a sole basis for treatment. Nasal washings and aspirates are unacceptable for Xpert Xpress SARS-CoV-2/FLU/RSV testing.  Fact Sheet for  Patients: EntrepreneurPulse.com.au  Fact Sheet for Healthcare Providers: IncredibleEmployment.be  This test is not yet approved or cleared by the [place] FDA and has been authorized for detection and/or diagnosis of SARS-CoV-2 by FDA under an Emergency Use Authorization (EUA). This EUA will remain in effect (meaning this test can be used) for the duration of the COVID-19 declaration under Section 564(b)(1) of the Act, 21 U.S.C. section 360bbb-3(b)(1), unless the  authorization is terminated or revoked.  Performed at [facility], [place] [address] [place], [place] [address]          Radiology Studies: DG CHEST PORT 1 VIEW  Result Date: 09/18/2021 CLINICAL DATA:  Left PICC placement EXAM: PORTABLE CHEST 1 VIEW COMPARISON:  09/14/2021 FINDINGS: Single frontal view of the chest demonstrates stable right chest wall port. Left-sided PICC tip overlies the superior vena cava. Cardiac silhouette is stable. Stable left basilar consolidation. Small effusion is suspected. Right chest is clear. No pneumothorax. IMPRESSION: 1. Left-sided PICC tip overlying superior vena cava. 2. Continued left basilar consolidation and likely small effusion. Electronically Signed   By: Randa Ngo M.D.   On: 09/18/2021 17:32   [place] EKG Site Rite  Result Date: 09/18/2021 If Site Rite image not attached, placement could not be confirmed due to current cardiac rhythm.  [place] EKG SITE RITE  Result Date: 09/18/2021 If Site Rite image not attached, placement could not be confirmed due to current cardiac rhythm.       Scheduled Meds:  Chlorhexidine Gluconate Cloth  6 each Topical Daily   dexamethasone  4 mg Oral Daily   enoxaparin (LOVENOX) injection  40 mg Subcutaneous Q24H   fentaNYL  1 patch Transdermal Q72H   FLUOROURACIL (ADRUCIL) CHEMO infusion For Inpatient Use  2,400 mg/m2 (Treatment Plan Recorded) Intravenous Once   insulin aspart  0-9 Units Subcutaneous 4 times per day   pantoprazole (PROTONIX) IV  40 mg Intravenous Q24H   senna-docusate  2 tablet Oral QHS   sodium chloride flush  10-40 mL Intracatheter Q12H   Continuous Infusions:  sodium chloride 50 mL/hr at 09/19/21 0309   ampicillin-sulbactam (UNASYN) IV 3 g (09/19/21 0612)   dextrose     dextrose     promethazine (PHENERGAN) injection (IM or IVPB) 25 mg (73/56/70 1410)   TPN CYCLIC-ADULT (ION)       LOS: 4 days     Cordelia Poche, MD [facility] 09/19/2021, 10:54 AM  If  7PM-7AM, please contact night-coverage [email or web address]

## 2021-09-19 NOTE — Assessment & Plan Note (Signed)
Placed on 09/18/21 for TPN.

## 2021-09-19 NOTE — Progress Notes (Signed)
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  He was recently hospitalized and discharged on 09/13/21 with home TPN.  Pharmacy managed his TPN during that admission.  He presented back to the ED on 09/14/21 with c/o abdominal pain and N/V.  Pharmacy has been consulted to manage pt's TPN.  - pt was on cyclic (12 hr) TPN PTA. Patient's wife stated that last TPN bag hung on 09/13/21 at 7:30p and this was stopped on 09/14/21 at 7:30a  Glucose / Insulin: No hx DM. Hgb A1c 5.5% - on sSSI four times a day (used 6 units in 24 hrs) -CBG (goal <150): 113-235 (two readings in the 200s while on TPN) - on dexamethasone 4 mg PO daily started 1/3  - SoluMedrol 125mg  x1 on 1/5 as pre-med for chemo Electrolytes: Na low at 131, K up 4.7 - CorrCa and other lytes wnl Renal: SCr <1; stable. BUN slightly elevated at 21 Hepatic: LFTs WNL - albumin low Intake / Output; MIVF: No MIVF - I/O: +2525 ml - Drain output: 525 ml/24 hr GI Imaging: - 09/02/21 abd CT:  findings consistent with small-bowel obstruction, free fluid rt abdomen, small l pleural effusion, stable metastatic disease - 12/26 KUB: continued SBO pattern GI Surgeries / Procedures:  - 5/20 lap appy - 3/22 ex lap  w/ partial bowel resection and ostomy creation - 8/22 diagnostic laprascopy w/ peritoneal biopsies - 12/29: perc gastrostomy  Central access: implanted port 12/12/20                             PICC placement 1/5 TPN start date: 09/04/21 --> resumed on adm on 09/14/21  Nutritional Goals: Goal TPN 2377mL/24hr (provides 125 g of protein and 2234 kcals per  day)  RD Assessment (on 12/29) : Estimated Needs Total Energy Estimated Needs: 2200-2400 kcal Total Protein Estimated Needs: 115-125 grams Total Fluid Estimated Needs: >/= 2.4 L/day  Current Nutrition:  TPN NPO except for ice chips, sips with meds Note TPN not available 1/4 pm, D10W hung at 95 ml/hr in place of TPN - d/c for access for chemo pre-meds  Plan:   At 1800: Continue TPN cyclic rate over 12 hr, from 6pm to 6am Electrolytes in TPN: No changes Increase Na to 150 mEq/L Decrease K to 30 mEq/L Ca 2mEq/L Mg 106mEq/L Phos 68mmol/L Cl:Ac 1:1 Add standard MVI and trace elements to TPN Sensitive SSI at 8p, 11p, 7a, noon.  MD managing IVF --> currently not on IVF TPN labs Mon, Thurs BMET, Mag, Phos in am  Lynelle Doctor, PharmD 09/19/2021,7:10 AM

## 2021-09-19 NOTE — Progress Notes (Signed)
° ° ° ° ° °  Wife is at the bedside. States he feels better today. He actually has been up and walked up and down the halls 3 times in a single session. States he can tell his stamina has decreased but appreciative of his ability to do this as he has not been able to. States second dose of oral dilaudid began to show some difference. He reports if he continues to do well today he is hopeful that he will solely be using oral Dilaudid by tomorrow.   Over the past 24 hours he has received 8mg  IV Dilaudid and 8 mg oral dilaudid. IV Dilaudid has decreased from 12mg  from 48hrs with hopes of some relief with oral and Fentanyl patch. He reports he can tell somewhat of a difference but not anything major. Has been able to go at longest 5.5 hrs in between doses which is an improvement. Appears he was able to sleep throughout the night last night. We have increased Fentanyl patch to 100 mcg and oral dilaudid to 6 mg every 3 hours, taking into consideration total use over past 24 hours.   Per Dr. Burr Medico Dulcolax added due to no bowel movement. He says he feels the urge but no major production, although he did have a small bowel movement last night. Nausea is much improved.   Reluctant to start Robinul or Scopolamine given he has not had a significant bowel movement. He is hopeful he will have loose stools as this has been his previous experience with chemotherapy treatments.   Discussed at length ongoing evaluation to achieve increased quality of life and well controlled pain. He acknowledges his awareness of treatment intent and that his pain may not be completely resolved. He is ok with this sharing if his pain can remain at a 4 or less he will be in a good place. He rates his current pain level 4/10 and that he is comfortable.   All questions answered and support provided.   Assessment -NAD, appears comfortable -RRR -normal breathing pattern -AAO x3  Plan -Fentanyl patch 100 mcg initiated yesterday  evening -Dilaudid 6mg  po as needed for breakthrough pain  -Senna-S and Dulcolax for bowel regimen -Mr. Salasar reports pain 4/10 and seems to be improving each day. Able to walk the halls 3x this morning with wife's support. His expressed goals are clear to continue to treat aggressively, maintain well controlled-pain with hopes of focusing on oral regimen over the next 24 hours.  -PMT will continue to support and follow. Please send secured chat or page for urgent needs.   Time Total: 40 min.   Visit consisted of counseling and education dealing with the complex and emotionally intense issues of symptom management and palliative care in the setting of serious and potentially life-threatening illness.Greater than 50%  of this time was spent counseling and coordinating care related to the above assessment and plan.  Alda Lea, AGPCNP-BC  Palliative Medicine Team [phone]

## 2021-09-20 ENCOUNTER — Inpatient Hospital Stay: Payer: BC Managed Care – PPO

## 2021-09-20 LAB — BASIC METABOLIC PANEL
Anion gap: 6 (ref 5–15)
BUN: 23 mg/dL — ABNORMAL HIGH (ref 6–20)
CO2: 25 mmol/L (ref 22–32)
Calcium: 8 mg/dL — ABNORMAL LOW (ref 8.9–10.3)
Chloride: 103 mmol/L (ref 98–111)
Creatinine, Ser: 0.79 mg/dL (ref 0.61–1.24)
GFR, Estimated: >60 mL/min (ref >60)
Glucose, Bld: 116 mg/dL — ABNORMAL HIGH (ref 70–99)
Potassium: 4.1 mmol/L (ref 3.5–5.1)
Sodium: 134 mmol/L — ABNORMAL LOW (ref 135–145)

## 2021-09-20 LAB — GLUCOSE, CAPILLARY
Glucose-Capillary: 139 mg/dL — ABNORMAL HIGH (ref 70–99)
Glucose-Capillary: 139 mg/dL — ABNORMAL HIGH (ref 70–99)
Glucose-Capillary: 88 mg/dL (ref 70–99)
Glucose-Capillary: 89 mg/dL (ref 70–99)
Glucose-Capillary: 95 mg/dL (ref 70–99)

## 2021-09-20 LAB — PHOSPHORUS: Phosphorus: 4.2 mg/dL (ref 2.5–4.6)

## 2021-09-20 LAB — MAGNESIUM: Magnesium: 2.2 mg/dL (ref 1.7–2.4)

## 2021-09-20 MED ORDER — DIPHENHYDRAMINE HCL 25 MG PO CAPS
25.0000 mg | ORAL_CAPSULE | Freq: Once | ORAL | Status: AC
  Administered 2021-09-20: 25 mg via ORAL
  Filled 2021-09-20: qty 1

## 2021-09-20 MED ORDER — TRAVASOL 10 % IV SOLN
INTRAVENOUS | Status: AC
  Filled 2021-09-20: qty 1254

## 2021-09-20 NOTE — Progress Notes (Signed)
PROGRESS NOTE    Ricardo [name]  IOE:[id number] DOB: [birth year] DOA: 09/14/2021 PCP: [name], MD   Brief Narrative: Ricardo [name], 56 y.o. 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 In the ED afebrile mildly tachycardic, labs with leukocytosis likely poor, stable hemoglobin and renal function Lumin low 2.7 Abdominal x-ray showed interval improvement in gaseous distention of the small and large bowel loops compared with 12/26 X-ray showed left lower lung opacity concern about aspiration placed on antibiotics   Assessment & Plan:   Intractable abdominal pain Nausea and vomiting Recurrent issue. Recently reinserted PEG tube placement for venting. Palliative care consulted for pain management. Pain control is improving with analgesics. -Palliative care medicine recommendations: Fentanyl patch, Decadron, Dilaudid PO PRN, Dilaudid IV PRN -Minimize IV narcotics to ensure proper outpatient oral regimen  Aspiration pneumonia In setting of recent vomiting. Started on empiric Unasyn and has completed treatment.  Metastatic colon cancer Metastasis to multiple sites including peritoneum. Medical oncology consulted. Chemotherapy (FOLFIRINOX) infused on 1/5. PICC line placed on 09/18/21. Receiving fluorouracil today.  Anxiety -Continue Xanax prn  GERD -Continue Protonix  Moderate malnutrition TPN started this admission, PICC line now in place. -Continue TPN  Normocytic  anemia Mild. Stable.  Hyperglycemia Hemoglobin A1C of 5.5%  Elevated blood pressure No diagnosis of hypertension -Hydralazine PO prn  Obesity Body mass index is 36.22 kg/m.   DVT prophylaxis: Lovenox Code Status:   Code Status: Partial Code Family Communication: None at bedside Disposition Plan: Discharge home in 24 hours pending completion of treatment.   Consultants:  Medical oncology Palliative care medicine  Procedures:  None  Antimicrobials: Unasyn IV    Subjective: No issues noted overnight.  Objective: Vitals:   09/19/21 0448 09/19/21 1320 09/19/21 2029 09/20/21 0418  BP: 135/89 134/86 140/86 135/88  Pulse: 88 75 82 86  Resp: 18 18 17 18   Temp: (!) 97.5 F (36.4 C) (!) 97.5 F (36.4 C) 98.2 F (36.8 C) 97.6 F (36.4 C)  TempSrc: Oral Oral Oral Oral  SpO2: 96% 95% 95% 95%  Weight:      Height:        Intake/Output Summary (Last 24 hours) at 09/20/2021 1229 Last data filed at 09/20/2021 0916 Gross per 24 hour  Intake 1746.09 ml  Output --  Net 1746.09 ml    Filed Weights   09/14/21 1058 09/14/21 1847  Weight: 117.8 kg 117.8 kg    Examination:  General: Well appearing, no distress    Data Reviewed: I have personally reviewed following labs and imaging studies  CBC Lab Results  Component Value Date   WBC 12.6 (H) 09/17/2021   RBC 3.78 (L) 09/17/2021   HGB 11.1 (L) 09/17/2021   HCT 34.2 (L) 09/17/2021   MCV 90.5 09/17/2021   MCH 29.4 09/17/2021   PLT 277 09/17/2021   MCHC 32.5 09/17/2021   RDW 15.1 09/17/2021   LYMPHSABS 0.7 09/10/2021   MONOABS 1.4 (H) 09/10/2021   EOSABS 0.2 09/10/2021   BASOSABS 0.0 18/29/9371     Last metabolic panel Lab  Results  Component Value Date   NA 134 (L) 09/20/2021   K 4.1 09/20/2021   CL 103 09/20/2021   CO2 25 09/20/2021   BUN 23 (H) 09/20/2021   CREATININE 0.79 09/20/2021   GLUCOSE 116 (H) 09/20/2021   GFRNONAA >60 09/20/2021   GFRAA 88 09/04/2020   CALCIUM 8.0 (L) 09/20/2021   PHOS  4.2 09/20/2021   PROT 6.5 09/18/2021   ALBUMIN 2.6 (L) 09/18/2021   LABGLOB 2.7 09/04/2020   AGRATIO 1.7 09/04/2020   BILITOT 0.7 09/18/2021   ALKPHOS 108 09/18/2021   AST 19 09/18/2021   ALT 21 09/18/2021   ANIONGAP 6 09/20/2021    CBG (last 3)  Recent Labs    09/19/21 2320 09/20/21 0622 09/20/21 1202  GLUCAP 115* 89 88      GFR: Estimated Creatinine Clearance: 134.6 mL/min (by C-G formula based on SCr of 0.79 mg/dL).  Coagulation Profile: No results for input(s): INR, PROTIME in the last 168 hours.  Recent Results (from the past 240 hour(s))  Resp Panel by RT-PCR (Flu A&B, Covid) Nasopharyngeal Swab     Status: None   Collection Time: 09/14/21  7:46 AM   Specimen: Nasopharyngeal Swab; Nasopharyngeal(NP) swabs in vial transport medium  Result Value Ref Range Status   SARS Coronavirus 2 by RT PCR NEGATIVE NEGATIVE Final    Comment: (NOTE) SARS-CoV-2 target nucleic acids are NOT DETECTED.  The SARS-CoV-2 RNA is generally detectable in upper respiratory specimens during the acute phase of infection. The lowest concentration of SARS-CoV-2 viral copies this assay can detect is 138 copies/mL. A negative result does not preclude SARS-Cov-2 infection and should not be used as the sole basis for treatment or other patient management decisions. A negative result may occur with  improper specimen collection/handling, submission of specimen other than nasopharyngeal swab, presence of viral mutation(s) within the areas targeted by this assay, and inadequate number of viral copies(<138 copies/mL). A negative result must be combined with clinical observations, patient history, and epidemiological information. The expected result is Negative.  Fact Sheet for Patients:  EntrepreneurPulse.com.au  Fact Sheet for Healthcare Providers:  IncredibleEmployment.be  This test is no t yet approved or cleared by the [place] FDA and  has been  authorized for detection and/or diagnosis of SARS-CoV-2 by FDA under an Emergency Use Authorization (EUA). This EUA will remain  in effect (meaning this test can be used) for the duration of the COVID-19 declaration under Section 564(b)(1) of the Act, 21 U.S.C.section 360bbb-3(b)(1), unless the authorization is terminated  or revoked sooner.       Influenza A by PCR NEGATIVE NEGATIVE Final   Influenza B by PCR NEGATIVE NEGATIVE Final    Comment: (NOTE) The Xpert Xpress SARS-CoV-2/FLU/RSV plus assay is intended as an aid in the diagnosis of influenza from Nasopharyngeal swab specimens and should not be used as a sole basis for treatment. Nasal washings and aspirates are unacceptable for Xpert Xpress SARS-CoV-2/FLU/RSV testing.  Fact Sheet for Patients: EntrepreneurPulse.com.au  Fact Sheet for Healthcare Providers: IncredibleEmployment.be  This test is not yet approved or cleared by the [place] FDA and has been authorized for detection and/or diagnosis of SARS-CoV-2 by FDA under an Emergency Use Authorization (EUA). This EUA will remain in effect (meaning this test can be used) for the duration of the COVID-19 declaration under Section 564(b)(1) of the Act, 21 U.S.C. section 360bbb-3(b)(1), unless the authorization is terminated or revoked.  Performed at [facility], [address] [place], [place]  [address]          Radiology Studies: DG CHEST PORT 1 VIEW  Result Date: 09/18/2021 CLINICAL DATA:  Left PICC placement EXAM: PORTABLE CHEST 1 VIEW COMPARISON:  09/14/2021 FINDINGS: Single frontal view of the chest demonstrates stable right chest wall port. Left-sided PICC tip overlies the superior vena cava. Cardiac silhouette is stable. Stable left basilar consolidation. Small effusion is suspected. Right chest is clear. No pneumothorax. IMPRESSION: 1. Left-sided PICC tip overlying superior vena cava. 2. Continued left  basilar consolidation and likely small effusion. Electronically Signed   By: Randa Ngo M.D.   On: 09/18/2021 17:32   [place] EKG Site Rite  Result Date: 09/18/2021 If Site Rite image not attached, placement could not be confirmed due to current cardiac rhythm.       Scheduled Meds:  Chlorhexidine Gluconate Cloth  6 each Topical Daily   dexamethasone  4 mg Oral Daily   enoxaparin (LOVENOX) injection  40 mg Subcutaneous Q24H   fentaNYL  1 patch Transdermal Q72H   FLUOROURACIL (ADRUCIL) CHEMO infusion For Inpatient Use  2,400 mg/m2 (Treatment Plan Recorded) Intravenous Once   insulin aspart  0-9 Units Subcutaneous 4 times per day   pantoprazole (PROTONIX) IV  40 mg Intravenous Q24H   senna-docusate  2 tablet Oral QHS   sodium chloride flush  10-40 mL Intracatheter Q12H   Continuous Infusions:  sodium chloride 50 mL/hr at 09/19/21 0309   dextrose     dextrose     promethazine (PHENERGAN) injection (IM or IVPB) 25 mg (44/01/02 7253)   TPN CYCLIC-ADULT (ION)       LOS: 5 days     Cordelia Poche, MD [facility] 09/20/2021, 12:29 PM  If 7PM-7AM, please contact night-coverage [email or web address]

## 2021-09-20 NOTE — Progress Notes (Signed)
PHARMACY - TOTAL PARENTERAL NUTRITION CONSULT NOTE   Indication: Prolonged malnutrition due to obstruction  Patient Measurements: Height: 5\' 11"  (180.3 cm) Weight: 119.1 kg (262 lb 9.1 oz) IBW/kg (Calculated) : Ricardo.3 TPN AdjBW (KG): 77.3 Body mass index is 36.62 kg/[name].  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  He was recently hospitalized and discharged on 09/13/21 with home TPN.  Pharmacy managed his TPN during that admission.  He presented back to the ED on 09/14/21 with c/o abdominal pain and N/V.  Pharmacy has been consulted to manage pt's TPN.  - pt was on cyclic (12 hr) TPN PTA. Patient's wife stated that last TPN bag hung on 09/13/21 at 7:30p and this was stopped on 09/14/21 at 7:30a  Glucose / Insulin: No hx DM. Hgb A1c 5.5% - on sSSI four times a day (used 2 units in 24 hrs) -CBG (goal <150): CBGs 89-146 - on dexamethasone 4 mg PO daily started 1/3  - SoluMedrol 125mg  x1 on 1/5 as pre-med for chemo Electrolytes: Na 131> 134 max Na in TPN, K 4.1 - CorrCa and other lytes wnl Renal: SCr <1; stable. BUN slightly elevated at 23 Hepatic: LFTs WNL - albumin low Intake / Output; MIVF: No MIVF - I/O: +1746 ml - Drain output: 0 ml/24 hr recorded 1/6, 525 recorded 1/5 GI Imaging: - 09/02/21 abd CT:  findings consistent with small-bowel obstruction, free fluid rt abdomen, small l pleural effusion, stable metastatic disease - 12/26 KUB: continued SBO pattern GI Surgeries / Procedures:  - 5/20 lap appy - 3/22 ex lap  w/ partial bowel resection and ostomy creation - 8/22 diagnostic laprascopy w/ peritoneal biopsies - 12/29: perc gastrostomy  Central access: implanted port 12/12/20                             PICC placement 1/5 TPN start date: 09/04/21 --> resumed on adm on 09/14/21  Nutritional Goals: Goal TPN 2363mL/24hr (provides 125 g of protein and 2234 kcals  per day)  RD Assessment (on 12/29) : Estimated Needs Total Energy Estimated Needs: 2200-2400 kcal Total Protein Estimated Needs: 115-125 grams Total Fluid Estimated Needs: >/= 2.4 L/day  Current Nutrition:  TPN NPO except for ice chips, sips with meds   Plan:   At 1800: Continue TPN cyclic rate over 12 hr, from 6pm to 6am Electrolytes in TPN: No changes Na 150 mEq/L K  30 mEq/L Ca 58mEq/L Mg 69mEq/L Phos 52mmol/L Cl:Ac 1:1 Add standard MVI and trace elements to TPN Sensitive SSI at 8p, 11p, 7a, noon.  MD managing IVF --> currently not on IVF TPN labs Mon, [place], Pharm.D 09/20/2021 7:24 AM

## 2021-09-21 DIAGNOSIS — Z515 Encounter for palliative care: Secondary | ICD-10-CM

## 2021-09-21 DIAGNOSIS — Z7189 Other specified counseling: Secondary | ICD-10-CM

## 2021-09-21 LAB — GLUCOSE, CAPILLARY
Glucose-Capillary: 122 mg/dL — ABNORMAL HIGH (ref 70–99)
Glucose-Capillary: 95 mg/dL (ref 70–99)
Glucose-Capillary: 84 mg/dL (ref 70–99)
Glucose-Capillary: 133 mg/dL — ABNORMAL HIGH (ref 70–99)

## 2021-09-21 MED ORDER — HYDROMORPHONE HCL 1 MG/ML IJ SOLN
1.0000 mg | INTRAMUSCULAR | Status: DC | PRN
  Administered 2021-09-21 – 2021-09-22 (×3): 1 mg via INTRAVENOUS
  Filled 2021-09-21 (×5): qty 1

## 2021-09-21 MED ORDER — FENTANYL 75 MCG/HR TD PT72
1.0000 | MEDICATED_PATCH | TRANSDERMAL | Status: DC
  Administered 2021-09-21 – 2021-09-24 (×2): 1 via TRANSDERMAL
  Filled 2021-09-21 (×2): qty 1

## 2021-09-21 MED ORDER — TRAVASOL 10 % IV SOLN
INTRAVENOUS | Status: AC
  Filled 2021-09-21: qty 1254

## 2021-09-21 MED ORDER — ALTEPLASE 2 MG IJ SOLR
2.0000 mg | Freq: Once | INTRAMUSCULAR | Status: AC
  Administered 2021-09-21: 2 mg
  Filled 2021-09-21: qty 2

## 2021-09-21 MED ORDER — FENTANYL 75 MCG/HR TD PT72
1.0000 | MEDICATED_PATCH | TRANSDERMAL | Status: DC
  Administered 2021-09-21: 1 via TRANSDERMAL
  Filled 2021-09-21 (×2): qty 1

## 2021-09-21 NOTE — Progress Notes (Signed)
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  He was recently hospitalized and discharged on 09/13/21 with home TPN.  Pharmacy managed his TPN during that admission.  He presented back to the ED on 09/14/21 with c/o abdominal pain and N/V.  Pharmacy has been consulted to manage pt's TPN.  - pt was on cyclic (12 hr) TPN PTA. Patient's wife stated that last TPN bag hung on 09/13/21 at 7:30p and this was stopped on 09/14/21 at 7:30a  Glucose / Insulin: No hx DM. Hgb A1c 5.5% - on sSSI four times a day (used 2 units in 24 hrs) -CBG (goal <150): CBGs 88-139 - on dexamethasone 4 mg PO daily started 1/3  - SoluMedrol 125mg  x1 on 1/5 as pre-med for chemo Electrolytes: labs 1/7: Na 131> 134 max Na in TPN, K 4.1- CorrCa and other lytes wnl No lytes 1/8 Renal: SCr <1; stable. BUN slightly elevated at 23 Hepatic: LFTs WNL - albumin low Intake / Output; MIVF: No MIVF - I/O: + 252 mo and urine x 1 occurence - Drain output: 0 ml/24 hr recorded 1/6 & 1/7 GI Imaging: - 09/02/21 abd CT:  findings consistent with small-bowel obstruction, free fluid rt abdomen, small l pleural effusion, stable metastatic disease - 12/26 KUB: continued SBO pattern GI Surgeries / Procedures:  - 5/20 lap appy - 3/22 ex lap  w/ partial bowel resection and ostomy creation - 8/22 diagnostic laprascopy w/ peritoneal biopsies - 12/29: perc gastrostomy  Central access: implanted port 12/12/20                             PICC placement 1/5 TPN start date: 09/04/21 --> resumed on adm on 09/14/21  Nutritional Goals: Goal TPN 2314mL/24hr (provides  125 g of protein and 2234 kcals per day)  RD Assessment (on 12/29) : Estimated Needs Total Energy Estimated Needs: 2200-2400 kcal Total Protein Estimated Needs: 115-125 grams Total Fluid Estimated Needs: >/= 2.4 L/day  Current Nutrition:  TPN NPO except for ice chips, sips with meds   Plan:  At 1800: Continue TPN cyclic rate over 12 hr, from 6pm to 6am Electrolytes in TPN: No changes Na 150 mEq/L K  30 mEq/L Ca 42mEq/L Mg 29mEq/L Phos 67mmol/L Cl:Ac 1:1 Add standard MVI and trace elements to TPN Sensitive SSI at 8p, 11p, 7a, noon.  MD managing IVF --> currently not on IVF TPN labs Mon, Thurs Not ready for DC yet due to pain control  Eudelia Bunch, Pharm.D 09/21/2021 6:15 AM

## 2021-09-21 NOTE — Progress Notes (Signed)
PROGRESS NOTE    Ricardo [name]  BTD:[id number] DOB: [birth year] DOA: 09/14/2021 PCP: [name], MD   Brief Narrative: Ricardo [name], 56 y.o. 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 In the ED afebrile mildly tachycardic, labs with leukocytosis likely poor, stable hemoglobin and renal function Lumin low 2.7 Abdominal x-ray showed interval improvement in gaseous distention of the small and large bowel loops compared with 12/26 X-ray showed left lower lung opacity concern about aspiration placed on antibiotics   Assessment & Plan:   Intractable abdominal pain Nausea and vomiting Recurrent issue. Recently reinserted PEG tube placement for venting. Palliative care consulted for pain management. Pain control is improving with analgesics. -Palliative care medicine recommendations: Fentanyl patch, Decadron, Dilaudid PO PRN, Dilaudid IV PRN (decreased frequency to q4 hours after discussion with PCM) -Minimize IV narcotics to ensure proper outpatient oral regimen  Aspiration pneumonia In setting of recent vomiting. Started on empiric Unasyn and has completed treatment.  Metastatic colon cancer Metastasis to multiple sites including peritoneum. Medical oncology consulted. Chemotherapy (FOLFIRINOX) infused on 1/5. PICC line placed on 09/18/21.  Anxiety -Continue Xanax prn  GERD -Continue Protonix  Moderate malnutrition TPN started this admission, PICC line now in  place. -Continue TPN  Normocytic anemia Mild. Stable.  Hyperglycemia Hemoglobin A1C of 5.5%  Elevated blood pressure No diagnosis of hypertension -Hydralazine PO prn  Obesity Body mass index is 36.22 kg/m.   DVT prophylaxis: Lovenox Code Status:   Code Status: Partial Code Family Communication: None at bedside Disposition Plan: Discharge home pending outpatient pain regimen per palliative care medicine   Consultants:  Medical oncology Palliative care medicine  Procedures:  None  Antimicrobials: Unasyn IV    Subjective: Patient states he had a rough night.   Objective: Vitals:   09/20/21 1356 09/20/21 2147 09/21/21 0543 09/21/21 1316  BP: (!) 146/87 (!) 147/91 (!) 143/89 (!) 146/98  Pulse: 87 80 88 94  Resp: 17 18 16 18   Temp: (!) 97.5 F (36.4 C) 98 F (36.7 C) 98.4 F (36.9 C) 97.6 F (36.4 C)  TempSrc: Oral Oral Oral Oral  SpO2: 95% 94% 95% 97%  Weight:      Height:        Intake/Output Summary (Last 24 hours) at 09/21/2021 1340 Last data filed at 09/21/2021 0950 Gross per 24 hour  Intake 552.21 ml  Output 750 ml  Net -197.79 ml    Filed Weights   09/14/21 1058 09/14/21 1847  Weight: 117.8 kg 117.8 kg    Examination:  General exam: Appears calm and comfortable Respiratory system: Clear to auscultation. Respiratory effort normal. Cardiovascular system: S1 & S2 heard, RRR. No murmurs, rubs, gallops or clicks. Gastrointestinal system: Abdomen is nondistended, soft and nontender. Minimal bowel sounds heard. Central nervous system: Lethargic but oriented. No focal neurological deficits. Musculoskeletal: No edema. No calf tenderness Skin: No cyanosis. No rashes    Data Reviewed: I have personally reviewed following labs and imaging studies  CBC Lab Results  Component Value Date   WBC 12.6 (H) 09/17/2021  RBC 3.78 (L) 09/17/2021   HGB 11.1 (L) 09/17/2021   HCT 34.2 (L) 09/17/2021   MCV 90.5 09/17/2021   MCH 29.4 09/17/2021   PLT 277  09/17/2021   MCHC 32.5 09/17/2021   RDW 15.1 09/17/2021   LYMPHSABS 0.7 09/10/2021   MONOABS 1.4 (H) 09/10/2021   EOSABS 0.2 09/10/2021   BASOSABS 0.0 12/45/8099     Last metabolic panel Lab Results  Component Value Date   NA 134 (L) 09/20/2021   K 4.1 09/20/2021   CL 103 09/20/2021   CO2 25 09/20/2021   BUN 23 (H) 09/20/2021   CREATININE 0.79 09/20/2021   GLUCOSE 116 (H) 09/20/2021   GFRNONAA >60 09/20/2021   GFRAA 88 09/04/2020   CALCIUM 8.0 (L) 09/20/2021   PHOS 4.2 09/20/2021   PROT 6.5 09/18/2021   ALBUMIN 2.6 (L) 09/18/2021   LABGLOB 2.7 09/04/2020   AGRATIO 1.7 09/04/2020   BILITOT 0.7 09/18/2021   ALKPHOS 108 09/18/2021   AST 19 09/18/2021   ALT 21 09/18/2021   ANIONGAP 6 09/20/2021    CBG (last 3)  Recent Labs    09/20/21 1941 09/20/21 2345 09/21/21 0430  GLUCAP 139* 139* 122*      GFR: Estimated Creatinine Clearance: 134.6 mL/min (by C-G formula based on SCr of 0.79 mg/dL).  Coagulation Profile: No results for input(s): INR, PROTIME in the last 168 hours.  Recent Results (from the past 240 hour(s))  Resp Panel by RT-PCR (Flu A&B, Covid) Nasopharyngeal Swab     Status: None   Collection Time: 09/14/21  7:46 AM   Specimen: Nasopharyngeal Swab; Nasopharyngeal(NP) swabs in vial transport medium  Result Value Ref Range Status   SARS Coronavirus 2 by RT PCR NEGATIVE NEGATIVE Final    Comment: (NOTE) SARS-CoV-2 target nucleic acids are NOT DETECTED.  The SARS-CoV-2 RNA is generally detectable in upper respiratory specimens during the acute phase of infection. The lowest concentration of SARS-CoV-2 viral copies this assay can detect is 138 copies/mL. A negative result does not preclude SARS-Cov-2 infection and should not be used as the sole basis for treatment or other patient management decisions. A negative result may occur with  improper specimen collection/handling, submission of specimen other than nasopharyngeal swab, presence of viral  mutation(s) within the areas targeted by this assay, and inadequate number of viral copies(<138 copies/mL). A negative result must be combined with clinical observations, patient history, and epidemiological information. The expected result is Negative.  Fact Sheet for Patients:  EntrepreneurPulse.com.au  Fact Sheet for Healthcare Providers:  IncredibleEmployment.be  This test is no t yet approved or cleared by the [place] FDA and  has been authorized for detection and/or diagnosis of SARS-CoV-2 by FDA under an Emergency Use Authorization (EUA). This EUA will remain  in effect (meaning this test can be used) for the duration of the COVID-19 declaration under Section 564(b)(1) of the Act, 21 U.S.C.section 360bbb-3(b)(1), unless the authorization is terminated  or revoked sooner.       Influenza A by PCR NEGATIVE NEGATIVE Final   Influenza B by PCR NEGATIVE NEGATIVE Final    Comment: (NOTE) The Xpert Xpress SARS-CoV-2/FLU/RSV plus assay is intended as an aid in the diagnosis of influenza from Nasopharyngeal swab specimens and should not be used as a sole basis for treatment. Nasal washings and aspirates are unacceptable for Xpert Xpress SARS-CoV-2/FLU/RSV testing.  Fact Sheet for Patients: EntrepreneurPulse.com.au  Fact Sheet for Healthcare Providers: IncredibleEmployment.be  This test is not yet approved or cleared by the  [place] Architectural technologist and has been authorized for detection and/or diagnosis of SARS-CoV-2 by FDA under an Print production planner (EUA). This EUA will remain in effect (meaning this test can be used) for the duration of the COVID-19 declaration under Section 564(b)(1) of the Act, 21 U.S.C. section 360bbb-3(b)(1), unless the authorization is terminated or revoked.  Performed at [facility], [place] [address] [place], [place] [address]          Radiology  Studies: No results found.      Scheduled Meds:  Chlorhexidine Gluconate Cloth  6 each Topical Daily   dexamethasone  4 mg Oral Daily   enoxaparin (LOVENOX) injection  40 mg Subcutaneous Q24H   fentaNYL  1 patch Transdermal Q72H   insulin aspart  0-9 Units Subcutaneous 4 times per day   pantoprazole (PROTONIX) IV  40 mg Intravenous Q24H   senna-docusate  2 tablet Oral QHS   sodium chloride flush  10-40 mL Intracatheter Q12H   Continuous Infusions:  sodium chloride 50 mL/hr at 09/19/21 0309   dextrose     dextrose     promethazine (PHENERGAN) injection (IM or IVPB) 25 mg (91/63/84 6659)   TPN CYCLIC-ADULT (ION)       LOS: 6 days     Cordelia Poche, MD [facility] 09/21/2021, 1:40 PM  If 7PM-7AM, please contact night-coverage [email or web address]

## 2021-09-21 NOTE — Progress Notes (Signed)
Daily Progress Note   Patient Name: Ricardo [name]       Date: 09/21/2021 DOB: Mar 18, 1966  Age: 56 y.o. MRN#: [id number] Attending Physician: Mariel Aloe, MD Primary Care Physician: [name], MD Admit Date: 09/14/2021  Reason for Consultation/Follow-up: Pain control  Subjective:  Patient complains of pain, generalized as well as in is arm, IV nurse at bedside to assess PICC site, wife at bedside,we discussed about his pain regimen, see below.   Length of Stay: 6  Current Medications: Scheduled Meds:   Chlorhexidine Gluconate Cloth  6 each Topical Daily   dexamethasone  4 mg Oral Daily   enoxaparin (LOVENOX) injection  40 mg Subcutaneous Q24H   fentaNYL  1 patch Transdermal Q72H   And   fentaNYL  1 patch Transdermal Q72H   insulin aspart  0-9 Units Subcutaneous 4 times per day   pantoprazole (PROTONIX) IV  40 mg Intravenous Q24H   senna-docusate  2 tablet Oral QHS   sodium chloride flush  10-40 mL Intracatheter Q12H    Continuous Infusions:  sodium chloride 50 mL/hr at 09/19/21 0309   dextrose     dextrose     promethazine (PHENERGAN) injection (IM or IVPB) 25 mg (37/10/62 6948)   TPN CYCLIC-ADULT (ION)      PRN Meds: sodium chloride, acetaminophen **OR** acetaminophen, albuterol, ALPRAZolam, atropine injection [facility], bisacodyl, hydrALAZINE, HYDROmorphone (DILAUDID) injection, HYDROmorphone, ondansetron **OR** ondansetron (ZOFRAN) IV, promethazine (PHENERGAN) injection (IM or IVPB), sodium chloride flush  Physical Exam         Awake alert Appears in pain, wincing and grimacing at times Regular work of breathing S 1 s 2  Abdomen is not tender No edema Non focal  Vital Signs: BP (!) 146/98 (BP Location: Right Arm)    Pulse 94    Temp 97.6 F (36.4 C)  (Oral)    Resp 18    Ht 5\' 11"  (1.803 m)    Wt 117.8 kg    SpO2 97%    BMI 36.22 kg/m  SpO2: SpO2: 97 % O2 Device: O2 Device: Room Air O2 Flow Rate: O2 Flow Rate (L/min): 0 L/min  Intake/output summary:  Intake/Output Summary (Last 24 hours) at 09/21/2021 1444 Last data filed at 09/21/2021 1407 Gross per 24 hour  Intake 552.21 ml  Output 750 ml  Net -197.79 ml   LBM: Last BM Date:  (Pt reports no BM in a week) Baseline Weight: Weight: 117.8 kg Most recent weight: Weight: 117.8 kg       Palliative Assessment/Data:      Patient Active Problem List   Diagnosis Date Noted   Peripherally inserted central catheter (PICC) in place 09/19/2021   Class 2 obesity 09/14/2021   Moderate protein malnutrition ([place]) 09/14/2021   Normocytic anemia 09/14/2021   Hyperglycemia 09/14/2021   Colon cancer metastasized to multiple sites ([place]) 09/06/2021   Pleural effusion, left 09/06/2021   GERD (gastroesophageal reflux disease) 09/03/2021   GAD (generalized anxiety disorder) 09/03/2021   History of COVID-19 04/30/2021   Genetic testing 01/17/2021   Dehydration 01/04/2021   Abdominal pain 01/04/2021   Nausea & vomiting 01/04/2021   High anion gap metabolic acidosis 37/16/9678   Leukocytosis 01/04/2021   AKI (acute kidney injury) ([place]) 01/03/2021   Port-A-Cath in place 01/02/2021   Family history of breast cancer    Family history of prostate cancer    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 male with PMH of  osteoarthritis, class II obesity, generalized anxiety  disorder, hyperlipidemia, unspecified sleep disorder, GERD, colon cancer with metastasis and peritoneal carcinomatosis  Assessment:  Uncontrolled generalized pain.   Recommendations/Plan: Increase trans dermal Fentanyl to 150 mcg change Q 72 hours.  Continue with PO Dilaudid 6 mg PRN Also has rescue IV Dilaudid available.  PMT will follow up on 09-22-21.       Code Status:    Code Status Orders  (From admission, onward)           Start     Ordered   09/14/21 0952  Limited resuscitation (code)  Continuous       Question Answer Comment  In the event of cardiac or respiratory ARREST: Initiate Code Blue, Call Rapid Response Yes   In the event of cardiac or respiratory ARREST: Perform CPR Yes   In the event of cardiac or respiratory ARREST: Perform Intubation/Mechanical Ventilation No   In the event of cardiac or respiratory ARREST: Use NIPPV/BiPAp only if indicated Yes   In the event of cardiac or respiratory ARREST: Administer ACLS medications if indicated Yes   In the event of cardiac or respiratory ARREST: Perform Defibrillation or Cardioversion if indicated No      09/14/21 0953           Code Status History     Date Active Date Inactive Code Status Order ID Comments User Context   09/03/2021 1617 09/13/2021 1639 Partial Code [id number]  Jimmy Footman, NP Inpatient   09/02/2021 2150 09/03/2021 1617 Full Code [id number]  Rhetta Mura, DO ED   08/27/2021 0312 08/29/2021 1553 Full Code [id number]  Etta Quill, DO ED   01/03/2021 2256 01/04/2021 1627 Full Code [id number]  Rhetta Mura, DO ED   11/27/2020 0557 12/03/2020 1643 Full Code [id number]  Chotiner, Yevonne Aline, MD ED   01/17/2019 2312 01/18/2019 1719 Full Code [id number]  Ralene Ok, MD Inpatient  Prognosis:  Guarded   Discharge Planning: To Be Determined  Care plan was discussed with  patient and wife, also discussed with Dr Lonny Prude and his bedside RN  Thank you for allowing the  Palliative Medicine Team to assist in the care of this patient.   Time In:  1400 Time Out: 1435 Total Time 35 Prolonged Time Billed No       Greater than 50%  of this time was spent counseling and coordinating care related to the above assessment and plan.  Loistine Chance, MD  Please contact Palliative Medicine Team phone at [phone] for questions and concerns.

## 2021-09-22 DIAGNOSIS — R109 Unspecified abdominal pain: Secondary | ICD-10-CM | POA: Diagnosis not present

## 2021-09-22 LAB — GLUCOSE, CAPILLARY
Glucose-Capillary: 106 mg/dL — ABNORMAL HIGH (ref 70–99)
Glucose-Capillary: 117 mg/dL — ABNORMAL HIGH (ref 70–99)
Glucose-Capillary: 122 mg/dL — ABNORMAL HIGH (ref 70–99)
Glucose-Capillary: 81 mg/dL (ref 70–99)
Glucose-Capillary: 88 mg/dL (ref 70–99)
Glucose-Capillary: 97 mg/dL (ref 70–99)

## 2021-09-22 LAB — COMPREHENSIVE METABOLIC PANEL
ALT: 33 U/L (ref 0–44)
AST: 23 U/L (ref 15–41)
Albumin: 2.5 g/dL — ABNORMAL LOW (ref 3.5–5.0)
Alkaline Phosphatase: 89 U/L (ref 38–126)
Anion gap: 7 (ref 5–15)
BUN: 22 mg/dL — ABNORMAL HIGH (ref 6–20)
CO2: 28 mmol/L (ref 22–32)
Calcium: 8 mg/dL — ABNORMAL LOW (ref 8.9–10.3)
Chloride: 100 mmol/L (ref 98–111)
Creatinine, Ser: 0.73 mg/dL (ref 0.61–1.24)
GFR, Estimated: >60 mL/min (ref >60)
Glucose, Bld: 110 mg/dL — ABNORMAL HIGH (ref 70–99)
Potassium: 4.1 mmol/L (ref 3.5–5.1)
Sodium: 135 mmol/L (ref 135–145)
Total Bilirubin: 0.4 mg/dL (ref 0.3–1.2)
Total Protein: 6.6 g/dL (ref 6.5–8.1)

## 2021-09-22 LAB — PHOSPHORUS: Phosphorus: 4 mg/dL (ref 2.5–4.6)

## 2021-09-22 LAB — TRIGLYCERIDES: Triglycerides: 52 mg/dL (ref <150)

## 2021-09-22 LAB — MAGNESIUM: Magnesium: 2 mg/dL (ref 1.7–2.4)

## 2021-09-22 MED ORDER — TRAVASOL 10 % IV SOLN
INTRAVENOUS | Status: AC
  Filled 2021-09-22: qty 1254

## 2021-09-22 NOTE — Progress Notes (Signed)
PROGRESS NOTE    Ricardo [name]  VOH:[id number] DOB: 10-Oct-1965 DOA: 09/14/2021 PCP: [name], MD   Brief Narrative: Ricardo [name], 56 y.o. 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 In the ED afebrile mildly tachycardic, labs with leukocytosis likely poor, stable hemoglobin and renal function Lumin low 2.7 Abdominal x-ray showed interval improvement in gaseous distention of the small and large bowel loops compared with 12/26 X-ray showed left lower lung opacity concern about aspiration placed on antibiotics   Assessment & Plan:   Intractable abdominal pain Nausea and vomiting Recurrent issue. Recently reinserted PEG tube placement for venting. Palliative care consulted for pain management. Pain control is improving with analgesics. -Palliative care medicine recommendations: Fentanyl patch, Decadron, Dilaudid PO PRN, Dilaudid IV PRN (decreased frequency to q4 hours after discussion with PCM) -Minimize IV narcotics to ensure proper outpatient oral regimen  Aspiration pneumonia In setting of recent vomiting. Started on empiric Unasyn and has completed treatment.  Metastatic colon cancer Metastasis to multiple sites including peritoneum. Medical oncology consulted. Chemotherapy (FOLFIRINOX) infused on 1/5. PICC line placed on 09/18/21.  Anxiety -Continue Xanax prn  GERD -Continue Protonix  Moderate malnutrition TPN started this admission, PICC line now in  place. -Continue TPN  Normocytic anemia Mild. Stable.  Hyperglycemia Hemoglobin A1C of 5.5%  Elevated blood pressure No diagnosis of hypertension -Hydralazine PO prn  Obesity Body mass index is 36.22 kg/m.   DVT prophylaxis: Lovenox Code Status:   Code Status: Partial Code Family Communication: Wife at bedside Disposition Plan: Discharge home pending outpatient pain regimen per palliative care medicine   Consultants:  Medical oncology Palliative care medicine  Procedures:  None  Antimicrobials: Unasyn IV    Subjective: Better pain control after increasing fentanyl patch dosing. No other concerns today.  Objective: Vitals:   09/21/21 0543 09/21/21 1316 09/21/21 2029 09/22/21 0359  BP: (!) 143/89 (!) 146/98 (!) 151/95 (!) 138/91  Pulse: 88 94 82 91  Resp: 16 18 18 18   Temp: 98.4 F (36.9 C) 97.6 F (36.4 C) 97.6 F (36.4 C) 98.5 F (36.9 C)  TempSrc: Oral Oral Oral Oral  SpO2: 95% 97% 96% 94%  Weight:      Height:        Intake/Output Summary (Last 24 hours) at 09/22/2021 1006 Last data filed at 09/22/2021 0617 Gross per 24 hour  Intake 4042.08 ml  Output 1100 ml  Net 2942.08 ml    Filed Weights   09/14/21 1058 09/14/21 1847  Weight: 117.8 kg 117.8 kg    Examination:  General exam: Appears calm and comfortable Respiratory system: Clear to auscultation. Respiratory effort normal. Cardiovascular system: S1 & S2 heard, RRR. No murmurs, rubs, gallops or clicks. Gastrointestinal system: Abdomen is nondistended, soft and tender on left/periumbilical areas. Decreased/minimal bowel sounds heard. Central nervous system: Alert and oriented. No focal neurological deficits. Musculoskeletal: No edema. No calf tenderness Skin: No cyanosis. No rashes Psychiatry: Judgement and insight appear normal. Mood & affect appropriate.     Data Reviewed: I have personally reviewed following labs and imaging  studies  CBC Lab Results  Component Value Date   WBC  12.6 (H) 09/17/2021   RBC 3.78 (L) 09/17/2021   HGB 11.1 (L) 09/17/2021   HCT 34.2 (L) 09/17/2021   MCV 90.5 09/17/2021   MCH 29.4 09/17/2021   PLT 277 09/17/2021   MCHC 32.5 09/17/2021   RDW 15.1 09/17/2021   LYMPHSABS 0.7 09/10/2021   MONOABS 1.4 (H) 09/10/2021   EOSABS 0.2 09/10/2021   BASOSABS 0.0 78/67/5449     Last metabolic panel Lab Results  Component Value Date   NA 135 09/22/2021   K 4.1 09/22/2021   CL 100 09/22/2021   CO2 28 09/22/2021   BUN 22 (H) 09/22/2021   CREATININE 0.73 09/22/2021   GLUCOSE 110 (H) 09/22/2021   GFRNONAA >60 09/22/2021   GFRAA 88 09/04/2020   CALCIUM 8.0 (L) 09/22/2021   PHOS 4.0 09/22/2021   PROT 6.6 09/22/2021   ALBUMIN 2.5 (L) 09/22/2021   LABGLOB 2.7 09/04/2020   AGRATIO 1.7 09/04/2020   BILITOT 0.4 09/22/2021   ALKPHOS 89 09/22/2021   AST 23 09/22/2021   ALT 33 09/22/2021   ANIONGAP 7 09/22/2021    CBG (last 3)  Recent Labs    09/22/21 0028 09/22/21 0401 09/22/21 0736  GLUCAP 117* 122* 88      GFR: Estimated Creatinine Clearance: 134.6 mL/min (by C-G formula based on SCr of 0.73 mg/dL).  Coagulation Profile: No results for input(s): INR, PROTIME in the last 168 hours.  Recent Results (from the past 240 hour(s))  Resp Panel by RT-PCR (Flu A&B, Covid) Nasopharyngeal Swab     Status: None   Collection Time: 09/14/21  7:46 AM   Specimen: Nasopharyngeal Swab; Nasopharyngeal(NP) swabs in vial transport medium  Result Value Ref Range Status   SARS Coronavirus 2 by RT PCR NEGATIVE NEGATIVE Final    Comment: (NOTE) SARS-CoV-2 target nucleic acids are NOT DETECTED.  The SARS-CoV-2 RNA is generally detectable in upper respiratory specimens during the acute phase of infection. The lowest concentration of SARS-CoV-2 viral copies this assay can detect is 138 copies/mL. A negative result does not preclude SARS-Cov-2 infection and should not be used as the sole basis for treatment or other patient management decisions. A  negative result may occur with  improper specimen collection/handling, submission of specimen other than nasopharyngeal swab, presence of viral mutation(s) within the areas targeted by this assay, and inadequate number of viral copies(<138 copies/mL). A negative result must be combined with clinical observations, patient history, and epidemiological information. The expected result is Negative.  Fact Sheet for Patients:  EntrepreneurPulse.com.au  Fact Sheet for Healthcare Providers:  IncredibleEmployment.be  This test is no t yet approved or cleared by the [place] FDA and  has been authorized for detection and/or diagnosis of SARS-CoV-2 by FDA under an Emergency Use Authorization (EUA). This EUA will remain  in effect (meaning this test can be used) for the duration of the COVID-19 declaration under Section 564(b)(1) of the Act, 21 U.S.C.section 360bbb-3(b)(1), unless the authorization is terminated  or revoked sooner.       Influenza A by PCR NEGATIVE NEGATIVE Final   Influenza B by PCR NEGATIVE NEGATIVE Final    Comment: (NOTE) The Xpert Xpress SARS-CoV-2/FLU/RSV plus assay is intended as an aid in the diagnosis of influenza from Nasopharyngeal swab specimens and should not be used as a sole basis for treatment. Nasal washings and aspirates are unacceptable for Xpert Xpress SARS-CoV-2/FLU/RSV testing.  Fact Sheet for Patients: EntrepreneurPulse.com.au  Fact  Sheet for Healthcare Providers: IncredibleEmployment.be  This test is not yet approved or cleared by the [place] and has been authorized for detection and/or diagnosis of SARS-CoV-2 by FDA under an Emergency Use Authorization (EUA). This EUA will remain in effect (meaning this test can be used) for the duration of the COVID-19 declaration under Section 564(b)(1) of the Act, 21 U.S.C. section 360bbb-3(b)(1), unless the authorization  is terminated or revoked.  Performed at [facility], [place] [address] [place], [place] [address]          Radiology Studies: No results found.      Scheduled Meds:  Chlorhexidine Gluconate Cloth  6 each Topical Daily   dexamethasone  4 mg Oral Daily   enoxaparin (LOVENOX) injection  40 mg Subcutaneous Q24H   fentaNYL  1 patch Transdermal Q72H   And   fentaNYL  1 patch Transdermal Q72H   pantoprazole (PROTONIX) IV  40 mg Intravenous Q24H   senna-docusate  2 tablet Oral QHS   sodium chloride flush  10-40 mL Intracatheter Q12H   Continuous Infusions:  sodium chloride 50 mL/hr at 09/22/21 0617   dextrose     dextrose     promethazine (PHENERGAN) injection (IM or IVPB) 25 mg (14/43/15 4008)   TPN CYCLIC-ADULT (ION)       LOS: 7 days     Cordelia Poche, MD [facility] 09/22/2021, 10:06 AM  If 7PM-7AM, please contact night-coverage [email or web address]

## 2021-09-22 NOTE — Progress Notes (Signed)
Nutrition Follow-up  DOCUMENTATION CODES:   Obesity unspecified  INTERVENTION:   -Needs updated weight for admission  -Cyclic TPN management per Pharmacy  NUTRITION DIAGNOSIS:   Increased nutrient needs related to chronic illness, cancer and cancer related treatments as evidenced by estimated needs.  Ongoing.  GOAL:   Patient will meet greater than or equal to 90% of their needs  Meeting with TPN  MONITOR:   Labs, Weight trends, I & O's, Other (Comment) (TPN regimen)  ASSESSMENT:   56 y.o. 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 He was discharged from [facility] on 12/31; during that admission had PEG placed for venting purposes. He returned to the ED on 1/1 due to recurrent abdominal pain and multiple episodes of N/V despite draining bag from PEG 3-4 times since returning home. In the ED, CXR showed LLL opacity concerning for aspiration; started on abx.  Patient continues to be NPO. Cyclic TPN x 12 hours providing 2234 kcals and 125g protein.  Admission weight: 259 lbs. Needs updated weight.  Medications: Senokot  Labs reviewed:  CBGs: 88-133 TG: 52  Diet Order:   Diet Order             Diet NPO time specified Except for: Ice Chips, Sips with Meds  Diet effective now                   EDUCATION NEEDS:   No education needs have been identified at this time  Skin:  Skin Assessment: Reviewed RN Assessment  Last BM:  PTA  Height:   Ht Readings from Last 1 Encounters:  09/14/21 5\' 11"  (1.803 m)    Weight:   Wt Readings from Last 1 Encounters:  09/14/21 117.8 kg    Ideal Body Weight:  78.2 kg  BMI:  Body mass index is 36.22 kg/m.  Estimated Nutritional Needs:   Kcal:  2200-2400 kcal  Protein:  115-125 grams  Fluid:  >/= 2.4 L/day  Clayton Bibles, MS, RD, LDN Inpatient Clinical  Dietitian Contact information available via Amion

## 2021-09-22 NOTE — Progress Notes (Signed)
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  He was recently hospitalized and discharged on 09/13/21 with home TPN.  Pharmacy managed his TPN during that admission.  He presented back to the ED on 09/14/21 with c/o abdominal pain and N/V.  Pharmacy has been consulted to manage pt's TPN.  - pt was on cyclic (12 hr) TPN PTA. Patient's wife stated that last TPN bag hung on 09/13/21 at 7:30p and this was stopped on 09/14/21 at 7:30a  Glucose / Insulin: No hx DM. Hgb A1c 5.5% - on sSSI four times a day (used 1 units in 24 hrs) -CBG (goal <150): CBGs 95-133 - on dexamethasone 4 mg PO daily started 1/3  - SoluMedrol 125mg  x1 on 1/5 as pre-med for chemo Electrolytes: labs 1/9: CorrCa and other lytes wnl Renal: SCr <1; stable. BUN slightly elevated at 22 Hepatic: LFTs WNL - albumin low Intake / Output; MIVF: No MIVF - unable to accurately assess w/ unmeasured occurrences/incomplete charting - Drain output: 1100 ml/24 hr  GI Imaging: - 09/02/21 abd CT:  findings consistent with small-bowel obstruction, free fluid rt abdomen, small l pleural effusion, stable metastatic disease - 12/26 KUB: continued SBO pattern GI Surgeries / Procedures:  - 5/20 lap appy - 3/22 ex lap  w/ partial bowel resection and ostomy creation - 8/22 diagnostic laprascopy w/ peritoneal biopsies - 12/29: perc gastrostomy  Central access: implanted port 12/12/20                             PICC placement 1/5 TPN start date: 09/04/21 --> resumed on adm on 09/14/21  Nutritional Goals: Goal TPN 2378mL/24hr (provides 125 g of protein and 2234  kcals per day)  RD Assessment (on 12/29) : Estimated Needs Total Energy Estimated Needs: 2200-2400 kcal Total Protein Estimated Needs: 115-125 grams Total Fluid Estimated Needs: >/= 2.4 L/day  Current Nutrition:  TPN NPO except for ice chips, sips with meds   Plan:  At 1800: Continue TPN cyclic rate over 12 hr, from 6pm to 6am Electrolytes in TPN: No changes Na 150 mEq/L K  30 mEq/L Ca 56mEq/L Mg 32mEq/L Phos 37mmol/L Cl:Ac 1:1 Add standard MVI and trace elements to TPN Discontinue CBGs and corrective insulin with CBGs consistently at goal and very little correction insulin used on dexamethasone MD managing IVF --> currently not on IVF TPN labs Mon, Thurs Not ready for DC yet due to pain control  Ulice Dash, PharmD  09/22/2021 7:25 AM

## 2021-09-22 NOTE — Progress Notes (Signed)
Ricardo [name]   DOB:09-Feb-1966   BP#:[id number]   [phone]  Oncology follow up   Subjective: Ricardo [name] is feeling better today, his pain is overall better controlled with increase to fentanyl patch dose, he had a small bowel movement this morning, no nausea or vomiting.  Objective:  Vitals:   09/22/21 1413 09/22/21 2141  BP: 132/85 135/89  Pulse: 89 82  Resp: 18 16  Temp: 97.9 F (36.6 C) 97.8 F (36.6 C)  SpO2: 94% 95%    Body mass index is 36.22 kg/m.  Intake/Output Summary (Last 24 hours) at 09/22/2021 2203 Last data filed at 09/22/2021 1900 Gross per 24 hour  Intake 2799.61 ml  Output 1950 ml  Net 849.61 ml     Sclerae unicteric  Oropharynx clear  No peripheral adenopathy  Lungs clear -- no rales or rhonchi  Heart regular rate and rhythm  Abdomen soft, no tenderness, minimal bowel sound  MSK no focal spinal tenderness, no peripheral edema  Neuro nonfocal    CBG (last 3)  Recent Labs    09/22/21 1141 09/22/21 1633 09/22/21 2143  GLUCAP 97 81 106*     Labs:  Urine Studies No results for input(s): UHGB, CRYS in the last 72 hours.  Invalid input(s): UACOL, UAPR, USPG, UPH, UTP, UGL, UKET, UBIL, UNIT, UROB, [place], UEPI, UWBC, [place], [place], [place], [place], [place]  Basic Metabolic Panel: Recent Labs  Lab 09/16/21 0618 09/18/21 0455 09/19/21 0500 09/20/21 0404 09/22/21 0315  NA 138 132* 131* 134* 135  K 4.1 3.9 4.7 4.1 4.1  CL 108 99 103 103 100  CO2 23 26 22 25 28   GLUCOSE 95 108* 109* 116* 110*  BUN 20 13 21* 23* 22*  CREATININE 0.81 0.90 0.87 0.79 0.73  CALCIUM 7.8* 8.0* 8.4* 8.0* 8.0*  MG 2.0 2.0 2.1 2.2 2.0  PHOS 4.0 4.1 3.9 4.2 4.0   GFR Estimated Creatinine Clearance: 134.6 mL/min (by C-G formula based on SCr of 0.73 mg/dL). Liver Function Tests: Recent Labs  Lab 09/18/21 0455 09/22/21 0315  AST 19 23  ALT 21 33  ALKPHOS 108 89  BILITOT 0.7 0.4  PROT 6.5 6.6  ALBUMIN 2.6* 2.5*   No results for input(s): LIPASE, AMYLASE in the last 168  hours.   No results for input(s): AMMONIA in the last 168 hours. Coagulation profile No results for input(s): INR, PROTIME in the last 168 hours.   CBC: Recent Labs  Lab 09/17/21 0622  WBC 12.6*  HGB 11.1*  HCT 34.2*  MCV 90.5  PLT 277   Cardiac Enzymes: No results for input(s): CKTOTAL, CKMB, CKMBINDEX, TROPONINI in the last 168 hours. BNP: Invalid input(s): POCBNP CBG: Recent Labs  Lab 09/22/21 0401 09/22/21 0736 09/22/21 1141 09/22/21 1633 09/22/21 2143  GLUCAP 122* 88 97 81 106*   D-Dimer No results for input(s): DDIMER in the last 72 hours. Hgb A1c No results for input(s): HGBA1C in the last 72 hours. Lipid Profile Recent Labs    09/22/21 0315  TRIG 52   Thyroid function studies No results for input(s): TSH, T4TOTAL, T3FREE, THYROIDAB in the last 72 hours.  Invalid input(s): FREET3 Anemia work up No results for input(s): VITAMINB12, FOLATE, FERRITIN, TIBC, IRON, RETICCTPCT in the last 72 hours. Microbiology Recent Results (from the past 240 hour(s))  Resp Panel by RT-PCR (Flu A&B, Covid) Nasopharyngeal Swab     Status: None   Collection Time: 09/14/21  7:46 AM   Specimen: Nasopharyngeal Swab; Nasopharyngeal(NP) swabs in vial transport  medium  Result Value Ref Range Status   SARS Coronavirus 2 by RT PCR NEGATIVE NEGATIVE Final    Comment: (NOTE) SARS-CoV-2 target nucleic acids are NOT DETECTED.  The SARS-CoV-2 RNA is generally detectable in upper respiratory specimens during the acute phase of infection. The lowest concentration of SARS-CoV-2 viral copies this assay can detect is 138 copies/mL. A negative result does not preclude SARS-Cov-2 infection and should not be used as the sole basis for treatment or other patient management decisions. A negative result may occur with  improper specimen collection/handling, submission of specimen other than nasopharyngeal swab, presence of viral mutation(s) within the areas targeted by this assay, and  inadequate number of viral copies(<138 copies/mL). A negative result must be combined with clinical observations, patient history, and epidemiological information. The expected result is Negative.  Fact Sheet for Patients:  EntrepreneurPulse.com.au  Fact Sheet for Healthcare Providers:  IncredibleEmployment.be  This test is no t yet approved or cleared by the [place] FDA and  has been authorized for detection and/or diagnosis of SARS-CoV-2 by FDA under an Emergency Use Authorization (EUA). This EUA will remain  in effect (meaning this test can be used) for the duration of the COVID-19 declaration under Section 564(b)(1) of the Act, 21 U.S.C.section 360bbb-3(b)(1), unless the authorization is terminated  or revoked sooner.       Influenza A by PCR NEGATIVE NEGATIVE Final   Influenza B by PCR NEGATIVE NEGATIVE Final    Comment: (NOTE) The Xpert Xpress SARS-CoV-2/FLU/RSV plus assay is intended as an aid in the diagnosis of influenza from Nasopharyngeal swab specimens and should not be used as a sole basis for treatment. Nasal washings and aspirates are unacceptable for Xpert Xpress SARS-CoV-2/FLU/RSV testing.  Fact Sheet for Patients: EntrepreneurPulse.com.au  Fact Sheet for Healthcare Providers: IncredibleEmployment.be  This test is not yet approved or cleared by the [place] FDA and has been authorized for detection and/or diagnosis of SARS-CoV-2 by FDA under an Emergency Use Authorization (EUA). This EUA will remain in effect (meaning this test can be used) for the duration of the COVID-19 declaration under Section 564(b)(1) of the Act, 21 U.S.C. section 360bbb-3(b)(1), unless the authorization is terminated or revoked.  Performed at [facility], [place] [address] [place], [place] [address]       Studies:  No results found.  Assessment: 56 y.o. male   Abdominal  pain secondary to  recurrent partial small bowel obstruction from peritoneal metastasis Metastatic colon cancer to peritoneum, on chemotherapy Dehydration, resolved  Deconditioning     Plan:  -Pain is better controlled, he is on fentanyl patch,dose increased yesterday, oral dilaudid PRN for most time  -continue TPN and venting PEG  -OK with clear liquid by mouth for pleasure  -he tolerated chemo well last week -I encouraged him to ambulate -if he is not requiring iv pain meds, hope to discharge home in a few days.I appreciate the excellent care from hospitalist and palliative care team    [name], MD 09/22/2021

## 2021-09-23 DIAGNOSIS — R109 Unspecified abdominal pain: Secondary | ICD-10-CM | POA: Diagnosis not present

## 2021-09-23 LAB — GLUCOSE, CAPILLARY
Glucose-Capillary: 105 mg/dL — ABNORMAL HIGH (ref 70–99)
Glucose-Capillary: 128 mg/dL — ABNORMAL HIGH (ref 70–99)
Glucose-Capillary: 130 mg/dL — ABNORMAL HIGH (ref 70–99)
Glucose-Capillary: 74 mg/dL (ref 70–99)
Glucose-Capillary: 92 mg/dL (ref 70–99)
Glucose-Capillary: 96 mg/dL (ref 70–99)

## 2021-09-23 MED ORDER — FENTANYL 100 MCG/HR TD PT72: 1.0000 | MEDICATED_PATCH | TRANSDERMAL | 0 refills | Status: DC

## 2021-09-23 MED ORDER — HEPARIN SOD (PORK) LOCK FLUSH 100 UNIT/ML IV SOLN
500.0000 [IU] | Freq: Once | INTRAVENOUS | Status: DC
  Filled 2021-09-23: qty 5

## 2021-09-23 MED ORDER — HYDROMORPHONE HCL 2 MG PO TABS: ORAL_TABLET | ORAL | 0 refills | Status: DC

## 2021-09-23 MED ORDER — DEXAMETHASONE 4 MG PO TABS
4.0000 mg | ORAL_TABLET | Freq: Every day | ORAL | 0 refills | Status: DC
Start: 2021-09-23 — End: 2021-10-14

## 2021-09-23 MED ORDER — TRAVASOL 10 % IV SOLN
INTRAVENOUS | Status: AC
  Filled 2021-09-23: qty 1254

## 2021-09-23 MED ORDER — FENTANYL 50 MCG/HR TD PT72: 1.0000 | MEDICATED_PATCH | TRANSDERMAL | 0 refills | Status: DC

## 2021-09-23 NOTE — Progress Notes (Signed)
PROGRESS NOTE    LADARRYL WRAGE  FGH:[id number] DOB: 11-Oct-1965 DOA: 09/14/2021 PCP: [name], MD   Brief Narrative: [name], 56 y.o. 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 In the ED afebrile mildly tachycardic, labs with leukocytosis likely poor, stable hemoglobin and renal function Lumin low 2.7 Abdominal x-ray showed interval improvement in gaseous distention of the small and large bowel loops compared with 12/26 X-ray showed left lower lung opacity concern about aspiration placed on antibiotics   Assessment & Plan:   Intractable abdominal pain Nausea and vomiting Recurrent issue. Recently reinserted PEG tube placement for venting. Palliative care consulted for pain management. Pain control is improving with analgesics. -Palliative care medicine recommendations: Fentanyl patch 75 mcg/hr x2 patches, Decadron, Dilaudid PO PRN  Aspiration pneumonia In setting of recent vomiting. Started on empiric Unasyn and has completed treatment.  Metastatic colon cancer Metastasis to multiple sites including peritoneum. Medical oncology consulted. Chemotherapy (FOLFIRINOX) infused on 1/5. PICC line placed on 09/18/21.  Anxiety -Continue Xanax prn  GERD -Continue Protonix  Moderate malnutrition TPN started this admission, PICC line now in place. -Continue TPN  Normocytic anemia Mild. Stable.  Hyperglycemia Hemoglobin A1C of 5.5%  Elevated blood pressure No  diagnosis of hypertension -Hydralazine PO prn  Obesity Body mass index is 36.22 kg/m.   DVT prophylaxis: Lovenox Code Status:   Code Status: Partial Code Family Communication: Wife at bedside Disposition Plan: Discharge home pending ability to receive outpatient narcotic medications; prior authorization pending. Medically stable for discharge.   Consultants:  Medical oncology Palliative care medicine  Procedures:  None  Antimicrobials: Unasyn IV    Subjective: Continues to have adequate pain control  Objective: Vitals:   09/22/21 1413 09/22/21 2141 09/23/21 0530 09/23/21 1725  BP: 132/85 135/89 131/84 (!) 141/90  Pulse: 89 82 87 80  Resp: 18 16 16 16   Temp: 97.9 F (36.6 C) 97.8 F (36.6 C) 98.4 F (36.9 C) 97.7 F (36.5 C)  TempSrc: Oral Oral Oral Oral  SpO2: 94% 95% 97% 95%  Weight:      Height:        Intake/Output Summary (Last 24 hours) at 09/23/2021 1836 Last data filed at 09/23/2021 1804 Gross per 24 hour  Intake 1891.45 ml  Output 850 ml  Net 1041.45 ml    Filed Weights   09/14/21 1058 09/14/21 1847  Weight: 117.8 kg 117.8 kg    Examination:  General exam: Appears calm and comfortable Respiratory system: Clear to auscultation. Respiratory effort normal. Cardiovascular system: S1 & S2 heard, RRR. No murmurs, rubs, gallops or clicks. Gastrointestinal system: Abdomen is minimally distended, soft and non-tender to moderate palpation. Normal bowel sounds heard. Central nervous system: Alert and oriented. No focal neurological deficits. Musculoskeletal: No edema. No calf tenderness Skin: No cyanosis. No rashes Psychiatry: Judgement and insight appear normal. Mood & affect appropriate.     Data Reviewed: I have personally reviewed following labs and imaging studies  CBC Lab Results  Component Value Date   WBC 12.6 (H) 09/17/2021   RBC 3.78 (  L) 09/17/2021   HGB 11.1 (L) 09/17/2021   HCT 34.2 (L) 09/17/2021   MCV 90.5 09/17/2021   MCH 29.4  09/17/2021   PLT 277 09/17/2021   MCHC 32.5 09/17/2021   RDW 15.1 09/17/2021   LYMPHSABS 0.7 09/10/2021   MONOABS 1.4 (H) 09/10/2021   EOSABS 0.2 09/10/2021   BASOSABS 0.0 06/30/5101     Last metabolic panel Lab Results  Component Value Date   NA 135 09/22/2021   K 4.1 09/22/2021   CL 100 09/22/2021   CO2 28 09/22/2021   BUN 22 (H) 09/22/2021   CREATININE 0.73 09/22/2021   GLUCOSE 110 (H) 09/22/2021   GFRNONAA >60 09/22/2021   GFRAA 88 09/04/2020   CALCIUM 8.0 (L) 09/22/2021   PHOS 4.0 09/22/2021   PROT 6.6 09/22/2021   ALBUMIN 2.5 (L) 09/22/2021   LABGLOB 2.7 09/04/2020   AGRATIO 1.7 09/04/2020   BILITOT 0.4 09/22/2021   ALKPHOS 89 09/22/2021   AST 23 09/22/2021   ALT 33 09/22/2021   ANIONGAP 7 09/22/2021    CBG (last 3)  Recent Labs    09/23/21 0801 09/23/21 1205 09/23/21 1722  GLUCAP 92 96 74      GFR: Estimated Creatinine Clearance: 134.6 mL/min (by C-G formula based on SCr of 0.73 mg/dL).   Recent Results (from the past 240 hour(s))  Resp Panel by RT-PCR (Flu A&B, Covid) Nasopharyngeal Swab     Status: None   Collection Time: 09/14/21  7:46 AM   Specimen: Nasopharyngeal Swab; Nasopharyngeal(NP) swabs in vial transport medium  Result Value Ref Range Status   SARS Coronavirus 2 by RT PCR NEGATIVE NEGATIVE Final    Comment: (NOTE) SARS-CoV-2 target nucleic acids are NOT DETECTED.  The SARS-CoV-2 RNA is generally detectable in upper respiratory specimens during the acute phase of infection. The lowest concentration of SARS-CoV-2 viral copies this assay can detect is 138 copies/mL. A negative result does not preclude SARS-Cov-2 infection and should not be used as the sole basis for treatment or other patient management decisions. A negative result may occur with  improper specimen collection/handling, submission of specimen other than nasopharyngeal swab, presence of viral mutation(s) within the areas targeted by this assay, and inadequate number of  viral copies(<138 copies/mL). A negative result must be combined with clinical observations, patient history, and epidemiological information. The expected result is Negative.  Fact Sheet for Patients:  EntrepreneurPulse.com.au  Fact Sheet for Healthcare Providers:  IncredibleEmployment.be  This test is no t yet approved or cleared by the [place] FDA and  has been authorized for detection and/or diagnosis of SARS-CoV-2 by FDA under an Emergency Use Authorization (EUA). This EUA will remain  in effect (meaning this test can be used) for the duration of the COVID-19 declaration under Section 564(b)(1) of the Act, 21 U.S.C.section 360bbb-3(b)(1), unless the authorization is terminated  or revoked sooner.       Influenza A by PCR NEGATIVE NEGATIVE Final   Influenza B by PCR NEGATIVE NEGATIVE Final    Comment: (NOTE) The Xpert Xpress SARS-CoV-2/FLU/RSV plus assay is intended as an aid in the diagnosis of influenza from Nasopharyngeal swab specimens and should not be used as a sole basis for treatment. Nasal washings and aspirates are unacceptable for Xpert Xpress SARS-CoV-2/FLU/RSV testing.  Fact Sheet for Patients: EntrepreneurPulse.com.au  Fact Sheet for Healthcare Providers: IncredibleEmployment.be  This test is not yet approved or cleared by the [place] FDA and has been authorized for detection and/or diagnosis of SARS-CoV-2 by FDA under  an Emergency Use Authorization (EUA). This EUA will remain in effect (meaning this test can be used) for the duration of the COVID-19 declaration under Section 564(b)(1) of the Act, 21 U.S.C. section 360bbb-3(b)(1), unless the authorization is terminated or revoked.  Performed at [facility], [place] [address] [place], [place] [address]       Radiology Studies: No results found.   Scheduled Meds:  Chlorhexidine Gluconate Cloth  6  each Topical Daily   dexamethasone  4 mg Oral Daily   enoxaparin (LOVENOX) injection  40 mg Subcutaneous Q24H   fentaNYL  1 patch Transdermal Q72H   And   fentaNYL  1 patch Transdermal Q72H   heparin lock flush  500 Units Intravenous Once   pantoprazole (PROTONIX) IV  40 mg Intravenous Q24H   senna-docusate  2 tablet Oral QHS   sodium chloride flush  10-40 mL Intracatheter Q12H   Continuous Infusions:  sodium chloride 50 mL/hr at 09/22/21 0617   dextrose     dextrose     promethazine (PHENERGAN) injection (IM or IVPB) 25 mg (38/38/18 4037)   TPN CYCLIC-ADULT (ION) 543 mL/hr at 09/23/21 1757     LOS: 8 days     Cordelia Poche, MD [facility] 09/23/2021, 6:36 PM  If 7PM-7AM, please contact night-coverage [email or web address]

## 2021-09-23 NOTE — Progress Notes (Signed)
Ricardo [name]   DOB:28-Oct-1965   XK#:[id number]   [phone]  Oncology follow up   Subjective: Ricardo [name] is clinical stable, pain is controlled, may go home today or tomorrow. He was upset this morning due to the lack of clear communication about his discharge planning.   Objective:  Vitals:   09/23/21 1725 09/23/21 1950  BP: (!) 141/90 134/83  Pulse: 80 82  Resp: 16 18  Temp: 97.7 F (36.5 C) 97.9 F (36.6 C)  SpO2: 95% 94%    Body mass index is 36.22 kg/m.  Intake/Output Summary (Last 24 hours) at 09/23/2021 2226 Last data filed at 09/23/2021 2036 Gross per 24 hour  Intake 1891.45 ml  Output 750 ml  Net 1141.45 ml     Sclerae unicteric  Oropharynx clear  No peripheral adenopathy  Lungs clear -- no rales or rhonchi  Heart regular rate and rhythm  Abdomen soft, no tenderness, minimal bowel sound  MSK no focal spinal tenderness, no peripheral edema  Neuro nonfocal    CBG (last 3)  Recent Labs    09/23/21 1205 09/23/21 1722 09/23/21 1954  GLUCAP 96 74 130*     Labs:  Urine Studies No results for input(s): UHGB, CRYS in the last 72 hours.  Invalid input(s): UACOL, UAPR, USPG, UPH, UTP, UGL, UKET, UBIL, UNIT, UROB, [place], UEPI, UWBC, [place], [place], [place], [place], [place]  Basic Metabolic Panel: Recent Labs  Lab 09/18/21 0455 09/19/21 0500 09/20/21 0404 09/22/21 0315  NA 132* 131* 134* 135  K 3.9 4.7 4.1 4.1  CL 99 103 103 100  CO2 26 22 25 28   GLUCOSE 108* 109* 116* 110*  BUN 13 21* 23* 22*  CREATININE 0.90 0.87 0.79 0.73  CALCIUM 8.0* 8.4* 8.0* 8.0*  MG 2.0 2.1 2.2 2.0  PHOS 4.1 3.9 4.2 4.0   GFR Estimated Creatinine Clearance: 134.6 mL/min (by C-G formula based on SCr of 0.73 mg/dL). Liver Function Tests: Recent Labs  Lab 09/18/21 0455 09/22/21 0315  AST 19 23  ALT 21 33  ALKPHOS 108 89  BILITOT 0.7 0.4  PROT 6.5 6.6  ALBUMIN 2.6* 2.5*   No results for input(s): LIPASE, AMYLASE in the last 168 hours.   No results for input(s): AMMONIA in  the last 168 hours. Coagulation profile No results for input(s): INR, PROTIME in the last 168 hours.   CBC: Recent Labs  Lab 09/17/21 0622  WBC 12.6*  HGB 11.1*  HCT 34.2*  MCV 90.5  PLT 277   Cardiac Enzymes: No results for input(s): CKTOTAL, CKMB, CKMBINDEX, TROPONINI in the last 168 hours. BNP: Invalid input(s): POCBNP CBG: Recent Labs  Lab 09/23/21 0400 09/23/21 0801 09/23/21 1205 09/23/21 1722 09/23/21 1954  GLUCAP 128* 92 96 74 130*   D-Dimer No results for input(s): DDIMER in the last 72 hours. Hgb A1c No results for input(s): HGBA1C in the last 72 hours. Lipid Profile Recent Labs    09/22/21 0315  TRIG 52   Thyroid function studies No results for input(s): TSH, T4TOTAL, T3FREE, THYROIDAB in the last 72 hours.  Invalid input(s): FREET3 Anemia work up No results for input(s): VITAMINB12, FOLATE, FERRITIN, TIBC, IRON, RETICCTPCT in the last 72 hours. Microbiology Recent Results (from the past 240 hour(s))  Resp Panel by RT-PCR (Flu A&B, Covid) Nasopharyngeal Swab     Status: None   Collection Time: 09/14/21  7:46 AM   Specimen: Nasopharyngeal Swab; Nasopharyngeal(NP) swabs in vial transport medium  Result Value Ref Range Status   SARS  Coronavirus 2 by RT PCR NEGATIVE NEGATIVE Final    Comment: (NOTE) SARS-CoV-2 target nucleic acids are NOT DETECTED.  The SARS-CoV-2 RNA is generally detectable in upper respiratory specimens during the acute phase of infection. The lowest concentration of SARS-CoV-2 viral copies this assay can detect is 138 copies/mL. A negative result does not preclude SARS-Cov-2 infection and should not be used as the sole basis for treatment or other patient management decisions. A negative result may occur with  improper specimen collection/handling, submission of specimen other than nasopharyngeal swab, presence of viral mutation(s) within the areas targeted by this assay, and inadequate number of viral copies(<138 copies/mL). A  negative result must be combined with clinical observations, patient history, and epidemiological information. The expected result is Negative.  Fact Sheet for Patients:  EntrepreneurPulse.com.au  Fact Sheet for Healthcare Providers:  IncredibleEmployment.be  This test is no t yet approved or cleared by the [place] FDA and  has been authorized for detection and/or diagnosis of SARS-CoV-2 by FDA under an Emergency Use Authorization (EUA). This EUA will remain  in effect (meaning this test can be used) for the duration of the COVID-19 declaration under Section 564(b)(1) of the Act, 21 U.S.C.section 360bbb-3(b)(1), unless the authorization is terminated  or revoked sooner.       Influenza A by PCR NEGATIVE NEGATIVE Final   Influenza B by PCR NEGATIVE NEGATIVE Final    Comment: (NOTE) The Xpert Xpress SARS-CoV-2/FLU/RSV plus assay is intended as an aid in the diagnosis of influenza from Nasopharyngeal swab specimens and should not be used as a sole basis for treatment. Nasal washings and aspirates are unacceptable for Xpert Xpress SARS-CoV-2/FLU/RSV testing.  Fact Sheet for Patients: EntrepreneurPulse.com.au  Fact Sheet for Healthcare Providers: IncredibleEmployment.be  This test is not yet approved or cleared by the [place] FDA and has been authorized for detection and/or diagnosis of SARS-CoV-2 by FDA under an Emergency Use Authorization (EUA). This EUA will remain in effect (meaning this test can be used) for the duration of the COVID-19 declaration under Section 564(b)(1) of the Act, 21 U.S.C. section 360bbb-3(b)(1), unless the authorization is terminated or revoked.  Performed at [facility], [place] [address] [place],  [address]       Studies:  No results found.  Assessment: 56 y.o. 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  -will see him back in clinic next week for f/u and cycle 2 chemo on 1/19    [name], MD 09/23/2021

## 2021-09-23 NOTE — TOC Progression Note (Signed)
Transition of Care [facility]) - Progression Note    Patient Details  Name: Ricardo [name] MRN: [id number] Date of Birth: Mar 12, 1966  [facility]) CM/SW Contact  Judithe Keetch, Marjie Skiff, RN Phone Number: 09/23/2021, 3:01 PM  Clinical Narrative:     Due to pt needing prior auth for fentanyl and dilaudid for home he will have a delayed dc for today. [employer] and [employer] informed.  Expected Discharge Plan: Home/Self Care Barriers to Discharge: Continued Medical Work up  Expected Discharge Plan and Services Expected Discharge Plan: Home/Self Care In-house Referral: Clinical Social Work     Living arrangements for the past 2 months: Single Family Home Expected Discharge Date: 09/23/21               DME Arranged: N/A DME Agency: NA       Readmission Risk Interventions Readmission Risk Prevention Plan 09/16/2021 09/06/2021 08/28/2021  Transportation Screening - - Complete  HRI or [place] - Complete Complete  Social Work Consult for [place] Planning/Counseling - Complete Complete  Palliative Care Screening - Not Applicable Not Applicable  HRI or Home Care Consult Complete - -  [place] Recovery Care/Counseling Consult Complete - -  Palliative Care Screening Complete - -  [place] Not Applicable - -  Some recent data might be hidden

## 2021-09-23 NOTE — Discharge Summary (Incomplete)
Physician Discharge Summary  Ricardo [name] FMB:[id number] DOB: 1966/02/12 DOA: 09/14/2021  PCP: [name], MD  Admit date: 09/14/2021 Discharge date: 09/23/2021***  Admitted From: *** Disposition: ***  Recommendations for Outpatient Follow-up:  Follow up with PCP in 1 week*** Please obtain BMP/CBC in one week*** Please follow up on the following pending results: ***  Home Health: *** Equipment/Devices: ***  Discharge Condition: *** CODE STATUS: *** Diet recommendation: ***   Brief/Interim Summary:  Admission HPI written by Antonieta Pert, MD   [facility] course:  ***  Discharge Diagnoses:  Principal Problem:   Intractable abdominal pain Active Problems:   Peripherally inserted central catheter (PICC) in place   Hyperlipidemia   Leukocytosis   GERD (gastroesophageal reflux disease)   GAD (generalized anxiety disorder)   Colon cancer metastasized to multiple sites [facility])   Class 2 obesity   Moderate protein malnutrition ([place])   Normocytic anemia   Hyperglycemia   Palliative care by specialist   Goals of care, counseling/discussion    Discharge Instructions  Discharge Instructions     BEVACIZUMAB TREATMENT CONDITIONS   Complete by: As directed    Check BP prior to each cycle of bevacizumab. HOLD if BP>160/100 and contact MD. Notify MD if patient has unstable vital signs: Temperature > 38.5, RR > 30 or HR > 100.   TREATMENT CONDITIONS   Complete by: As directed    Patient should have CBC & CMP within 7 days prior to chemotherapy administration. NOTIFY MD IF: ANC < 1500, Hemoglobin < 8, PLT < 100,000,  Total Bili > 1.5, Creatinine > 1.5, ALT & AST > 80 or if patient has unstable vital signs: Temperature > 38.5, SBP > 180 or < 90, RR > 30 or HR > 100.  Pt has hx of oxaliplatin reaction.  Add'l premeds added to careplan and oxailiplatin will be in 1033m bag and run over 4 hrs from C3 on.      Allergies as of 09/23/2021       Reactions    Oxaliplatin Hives, Other (See Comments)   15 minutes into infusion, patient started to complain of feeling warm and states " it feels like the last time I had my reaction"        Medication List     STOP taking these medications    acetaminophen 500 MG tablet Commonly known as: TYLENOL   hydrocortisone 2.5 % rectal cream Commonly known as: Anusol-HC       TAKE these medications    ALPRAZolam 0.25 MG tablet Commonly known as: XANAX Take 1-2 tablets (0.25-0.5 mg total) by mouth at bedtime as needed for anxiety or sleep.   dexamethasone 4 MG tablet Commonly known as: DECADRON Take 1 tablet (4 mg total) by mouth daily.   fentaNYL 100 MCG/HR Commonly known as: DTamarack1 patch onto the skin every 3 (three) days.   fentaNYL 50 MCG/HR Commonly known as: DSutton1 patch onto the skin every 3 (three) days.   HYDROmorphone 2 MG tablet Commonly known as: Dilaudid Take 3 tablets (630m every 3-4 hours as needed for breakthrough pain. What changed:  how much to take how to take this when to take this reasons to take this additional instructions   naloxone 4 MG/0.1ML Liqd nasal spray kit Commonly known as: NARCAN Use 1 spray for opioid overdose   OVER THE COUNTER MEDICATION Take 1 capsule by mouth daily. Immunocal   pantoprazole 40 MG tablet Commonly  known as: Protonix Take 1 tablet (40 mg total) by mouth daily.   polyethylene glycol 17 g packet Commonly known as: MIRALAX / GLYCOLAX Take 17 g by mouth daily as needed for mild constipation.   prochlorperazine 10 MG tablet Commonly known as: COMPAZINE Take 1 tablet (10 mg total) by mouth every 6 (six) hours as needed for nausea or vomiting.        Allergies  Allergen Reactions   Oxaliplatin Hives and Other (See Comments)    15 minutes into infusion, patient started to complain of feeling warm and states " it feels like the last time I had my reaction"     Consultations: ***   Procedures/Studies: CT ABDOMEN PELVIS WO CONTRAST  Result Date: 08/27/2021 CLINICAL DATA:  Small-bowel obstruction with left lower quadrant pain. Leukocytosis also. Metastatic colon cancer. Ongoing chemotherapy. EXAM: CT ABDOMEN AND PELVIS WITHOUT CONTRAST TECHNIQUE: Multidetector CT imaging of the abdomen and pelvis was performed following the standard protocol without IV contrast. COMPARISON:  The recent CT with IV contrast 08/25/2021, CT abdomen and pelvis with IV and oral contrast 07/01/2021. FINDINGS: Lower chest: There is slight increased prominence of a few scattered epipericardial lymph nodes compared with 07/01/2021. No acute lung base findings. Hepatobiliary: No focal liver abnormality is seen. No gallstones, gallbladder wall thickening, or biliary dilatation. Pancreas: Unremarkable. No pancreatic ductal dilatation or surrounding inflammatory changes. Spleen: Unremarkable without contrast. Adrenals/Urinary Tract: No focal abnormality in the adrenal glands and renal cortex. No urinary stones or obstruction. Chronic perinephric stranding is similar. Normal bladder thickness. Stomach/Bowel: There is increasing dilatation of left upper to mid abdominal small bowel and anterior central small bowel up to 5 cm, although there is still relative decompressed appearance in the stomach and duodenum. The dilatation begins to be seen distal to the ligament of Treitz. The exact transition point could not be found but I suspect is probably related to abdominal wall adhesive disease with multiple small bowel segments closely abutting the anterior wall on multiple slices. The transitional segment is probably in the right mid to lower abdomen anteriorly. The appendix surgically absent. Rest of the small bowel is decompressed, some of it apparently having been removed. The large intestine wall is unremarkable except for uncomplicated diverticula along the left segments. Vascular/Lymphatic:  There is mild aortoiliac calcific plaque. No enlarged lymph nodes are seen. There is a stable subcentimeter to borderline sized ileocolic mesenteric lymph node in the right lower abdomen on axial 50. Reproductive: Normal prostate. Other: Diffuse haziness in omentum extends to the subphrenic space on the left. There is increased minimal ascites in the perihepatic space and right anterior abdominal mesenteric folds. There are small inguinal fat hernias. Musculoskeletal: Chronic L5 pars defects and grade 1 L5 spondylolisthesis. No destructive bone lesions. 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 IMPRESSION: 1. Intermediate to high-grade obstruction to approximately the mid small bowel, etiology is probably adhesive disease and the transition is most likely in the anterior right mid to lower abdomen along the abdominal wall. The exact transitional segment could not be located without contrast. 2. Slight increased prominence of a few epipericardial lymph nodes. Small stable ileocolic mesenteric node. 3. Diffuse omental haziness extending into the left subphrenic space concerning for metastatic disease, with small volume of ascites. No free air. 4. Aortic atherosclerosis.  Additional findings as above. Electronically Signed   By: Telford Nab M.D.   On: 08/27/2021 04:19   X-ray chest PA and lateral  Result Date: 09/14/2021 CLINICAL DATA:  56 year old  male with a history of colon cancer, recurrent SBO s/p venting gastrostomy tube, who presents to the emergency department today for evaluation of abdominal pain. Of note, patient recently admitted to the hospital for recurrent SBO and had a gastrostomy tube placed. He was actually discharged from the hospital yesterday. Since then he states he has been draining his PEG tube and he continues to have a significant amount of output so he has not drained in several hours. He is now complaining of severe abdominal pain, distention, nausea vomiting. EXAM:  CHEST - 2 VIEW COMPARISON:  01/04/2021. FINDINGS: Increased opacity at the left lung base compared to the prior exam, consistent with atelectasis, pneumonia or a combination. Remainder of the lungs is clear. Small left pleural effusion.  No pneumothorax. Cardiac silhouette normal in size. No mediastinal or hilar masses or evidence of adenopathy. Stable right internal jugular Port-A-Cath. Skeletal structures are grossly intact. IMPRESSION: 1. Left lower lung opacity consistent with a combination of atelectasis/pneumonia and a small effusion, new compared to the prior chest radiograph. Electronically Signed   By: Lajean Manes M.D.   On: 09/14/2021 10:59   Abd 1 View (KUB)  Result Date: 09/14/2021 CLINICAL DATA:  History of colon cancer with recurrent small bowel obstruction. Patient presents with abdominal pain. EXAM: ABDOMEN - 1 VIEW COMPARISON:  09/08/21 FINDINGS: Left upper quadrant gastrostomy tube identified. Since the previous exam there is been interval improvement in gaseous distension the of the small and large bowel loops. No new findings. IMPRESSION: Interval improvement in gaseous distension of the small and large bowel loops compared with 09/08/2021. Electronically Signed   By: Kerby Moors M.D.   On: 09/14/2021 10:57   DG Abd 1 View  Result Date: 09/08/2021 CLINICAL DATA:  Abdominal pain, nausea, vomiting EXAM: ABDOMEN - 1 VIEW COMPARISON:  09/07/2021 FINDINGS: Prominent small bowel loops centrally again noted, similar prior study compatible with persistent small bowel obstruction. No free air or organomegaly. Visualized lung bases clear. IMPRESSION: Continued small bowel obstruction pattern, not significantly changed. Electronically Signed   By: Rolm Baptise M.D.   On: 09/08/2021 15:12   DG Abd 1 View  Result Date: 09/07/2021 CLINICAL DATA:  Nausea and vomiting; EXAM: ABDOMEN - 1 VIEW COMPARISON:  September 04, 2021 ;September 03, 2021; September 02, 2021 FINDINGS: There are a few mildly  dilated loops of bowel in the upper abdomen, improved in comparison to prior from September 03, 2021. Interval removal of enteric tube. Partial visualization of CVC tip terminating over the region of the superior cavoatrial junction. No bowel gas visualized in the rectum. IMPRESSION: Persistent mild dilation of several loops of small bowel in the upper abdomen, overall improved since September 03, 2021. This could reflect persistent small-bowel obstruction. Electronically Signed   By: Valentino Saxon M.D.   On: 09/07/2021 14:00   DG Abdomen 1 View  Result Date: 09/03/2021 CLINICAL DATA:  Nasogastric tube placement EXAM: ABDOMEN - 1 VIEW COMPARISON:  None. FINDINGS: Nasogastric tube tip overlies the expected mid body of the stomach. Multiple gas-filled dilated loops of small bowel are seen within the mid abdomen and left upper quadrant. No free intraperitoneal gas. IMPRESSION: Nasogastric tube tip within the mid body of the stomach. Electronically Signed   By: Fidela Salisbury M.D.   On: 09/03/2021 00:51   CT ABDOMEN PELVIS W CONTRAST  Result Date: 09/02/2021 CLINICAL DATA:  Bowel obstruction suspected. Abdominal pain. Chemotherapy. Three weeks ago. EXAM: CT ABDOMEN AND PELVIS WITH CONTRAST TECHNIQUE: Multidetector CT imaging  of the abdomen and pelvis was performed using the standard protocol following bolus administration of intravenous contrast. CONTRAST:  8m OMNIPAQUE IOHEXOL 350 MG/ML SOLN COMPARISON:  CT abdomen and pelvis 08/27/2021. FINDINGS: Lower chest: There is a new trace left pleural effusion. There is atelectasis in the bilateral lung bases. Hepatobiliary: No focal liver abnormality is seen. No gallstones, gallbladder wall thickening, or biliary dilatation. Pancreas: Unremarkable. No pancreatic ductal dilatation or surrounding inflammatory changes. Spleen: Normal in size without focal abnormality. Adrenals/Urinary Tract: Adrenal glands are unremarkable. Kidneys are normal, without renal  calculi, focal lesion, or hydronephrosis. Bladder is unremarkable. Stomach/Bowel: There are dilated mid and proximal small bowel loops with air-fluid levels measuring up to 5 cm. Degree of distention has mildly increased. There is also new moderate air-fluid level within the stomach. Transition point is likely in the mid abdomen where there are multiple small bowel loops demonstrating adhesive disease to the anterior abdominal wall. This finding is unchanged. The colon is nondilated. The appendix is not seen. There is no pneumatosis or free air identified. Vascular/Lymphatic: Aortic atherosclerosis. No enlarged abdominal or pelvic lymph nodes. Nonenlarged cardiophrenic and ileocecal lymph nodes are unchanged. Reproductive: Prostate is unremarkable. Other: There are small fat containing bilateral inguinal hernias. There is a small amount of free fluid in the anterior right abdomen which has mildly increased. There is mesenteric/omental haziness in the left upper quadrant and anterior upper abdomen similar to the prior examination. Musculoskeletal: There are bilateral pars interarticularis defects at L5 without significant listhesis. IMPRESSION: 1. Again seen are findings of small-bowel obstruction. Transition point is likely in the mid abdomen where there are clustered small bowel loops adhered to the anterior abdominal wall. Can not exclude underlying obstructive lesion. Degree of distention has increased when compared to the prior exam. There is now air-fluid level in the stomach. 2. Increasing free fluid in the right abdomen. 3. New small left pleural effusion. 4. Stable diffuse omental haziness is unchanged, concerning for metastatic disease. Electronically Signed   By: ARonney AstersM.D.   On: 09/02/2021 20:30   CT ABDOMEN PELVIS W CONTRAST  Result Date: 08/25/2021 CLINICAL DATA:  56year old male with history of nausea and vomiting. Suspected bowel obstruction. EXAM: CT ABDOMEN AND PELVIS WITH CONTRAST  TECHNIQUE: Multidetector CT imaging of the abdomen and pelvis was performed using the standard protocol following bolus administration of intravenous contrast. CONTRAST:  841mOMNIPAQUE IOHEXOL 350 MG/ML SOLN COMPARISON:  CT of the abdomen and pelvis 07/01/2021. FINDINGS: Lower chest: Tip of central venous catheter terminating at the superior cavoatrial junction. Mild scarring in the posterior aspect of the left upper lobe. Hepatobiliary: No definite suspicious cystic or solid hepatic lesions. No intra or extrahepatic biliary ductal dilatation. Gallbladder is normal in appearance. Pancreas: No pancreatic mass. No pancreatic ductal dilatation. No pancreatic or peripancreatic fluid collections or inflammatory changes. Spleen: Unremarkable. Adrenals/Urinary Tract: Bilateral kidneys and adrenal glands are normal in appearance. No hydroureteronephrosis. Urinary bladder is normal in appearance. Stomach/Bowel: The appearance of the stomach is normal. No pathologic dilatation of small bowel or colon. Status post appendectomy. Mass-like soft tissue thickening in the region of the terminal ileum best appreciated on axial images 52 and 53 of series 2. Vascular/Lymphatic: Aortic atherosclerosis. No lymphadenopathy noted in the abdomen or pelvis. Prominent but nonenlarged ileocolic lymph node in the right lower quadrant measuring 6 mm in short axis (axial image 50 of series 2), stable compared to the prior study, nonspecific. Reproductive: Prostate gland and seminal vesicles are unremarkable in appearance.  Other: There continues to be some mild diffuse haziness throughout the omentum. Scattered volume of trace ascites is noted, most evident in the right-side of the abdomen (axial image 43 of series 2). No large peritoneal soft tissue masses are confidently identified. No pneumoperitoneum. Musculoskeletal: There are no aggressive appearing lytic or blastic lesions noted in the visualized portions of the skeleton. IMPRESSION: 1.  No findings to suggest bowel obstruction. 2. However, there are findings suggestive of intraperitoneal metastatic disease, including residual small volume of ascites and diffuse haziness throughout the omentum, which has increased slightly compared to the prior examination. 3. Aortic atherosclerosis. 4. Additional incidental findings, as above. Electronically Signed   By: Vinnie Langton M.D.   On: 08/25/2021 07:36   IR GASTROSTOMY TUBE MOD SED  Result Date: 09/11/2021 CLINICAL DATA:  History of metastatic colon carcinoma with persistent small-bowel obstruction and request to place a venting gastrostomy tube for symptomatic relief. EXAM: PERCUTANEOUS GASTROSTOMY TUBE PLACEMENT ANESTHESIA/SEDATION: Moderate (conscious) sedation was employed during this procedure. A total of Versed 4.0 mg and Fentanyl 100 mcg was administered intravenously by radiology nursing. Moderate Sedation Time: 15 minutes. The patient's level of consciousness and vital signs were monitored continuously by radiology nursing throughout the procedure under my direct supervision. CONTRAST:  23m OMNIPAQUE IOHEXOL 300 MG/ML  SOLN MEDICATIONS: 2 g IV Ancef. IV antibiotic was administered in an appropriate time interval prior to needle puncture of the skin. During the procedure the patient received 0.5 mg IV glucagon. FLUOROSCOPY TIME:  2 minutes and 24 seconds.  51.0 mGy. PROCEDURE: The procedure, risks, benefits, and alternatives were explained to the patient. Questions regarding the procedure were encouraged and answered. The patient understands and consents to the procedure. A time-out was performed prior to initiating the procedure. A 5-French catheter was advanced through the patient's mouth under fluoroscopy into the esophagus and to the level of the stomach. This catheter was used to insufflate the stomach with air under fluoroscopy. The abdominal wall was prepped with chlorhexidine in a sterile fashion, and a sterile drape was applied  covering the operative field. A sterile gown and sterile gloves were used for the procedure. Local anesthesia was provided with 1% Lidocaine. A skin incision was made in the upper abdominal wall. Under fluoroscopy, an 18 gauge trocar needle was advanced into the stomach. Contrast injection was performed to confirm intraluminal position of the needle tip. A single T tack was then deployed in the lumen of the stomach. This was brought up to tension at the skin surface. Over a guidewire, a 9-French sheath was advanced into the lumen of the stomach. The wire was left in place as a safety wire. A loop snare device from a percutaneous gastrostomy kit was then advanced into the stomach. A floppy guide wire was advanced through the orogastric catheter under fluoroscopy in the stomach. The loop snare advanced through the percutaneous gastric access was used to snare the guide wire. This allowed withdrawal of the loop snare out of the patient's mouth by retraction of the orogastric catheter and wire. A 20-French bumper retention gastrostomy tube was looped around the snare device. It was then pulled back through the patient's mouth. The retention bumper was brought up to the anterior gastric wall. The T tack suture was cut at the skin. The exiting gastrostomy tube was cut to appropriate length and a feeding adapter applied. The catheter was injected with contrast material to confirm position and a fluoroscopic spot image saved. The tube was then flushed with  saline. A dressing was applied over the gastrostomy exit site. COMPLICATIONS: None. FINDINGS: The stomach distended well with air allowing safe placement of the gastrostomy tube. After placement, the tip of the gastrostomy tube lies in the body of the stomach. IMPRESSION: Percutaneous gastrostomy with placement of a 20-French bumper retention tube in the body of the stomach. Electronically Signed   By: Aletta Edouard M.D.   On: 09/11/2021 16:11   DG CHEST PORT 1  VIEW  Result Date: 09/18/2021 CLINICAL DATA:  Left PICC placement EXAM: PORTABLE CHEST 1 VIEW COMPARISON:  09/14/2021 FINDINGS: Single frontal view of the chest demonstrates stable right chest wall port. Left-sided PICC tip overlies the superior vena cava. Cardiac silhouette is stable. Stable left basilar consolidation. Small effusion is suspected. Right chest is clear. No pneumothorax. IMPRESSION: 1. Left-sided PICC tip overlying superior vena cava. 2. Continued left basilar consolidation and likely small effusion. Electronically Signed   By: Randa Ngo M.D.   On: 09/18/2021 17:32   DG ABD ACUTE 2+V W 1V CHEST  Result Date: 08/27/2021 CLINICAL DATA:  Metastatic colon cancer. Abdominal pain left lower quadrant with vomiting. EXAM: DG ABDOMEN ACUTE WITH 1 VIEW CHEST COMPARISON:  CT with IV contrast 08/25/2021 FINDINGS: There is increased dilatation of left mid to lower abdominal small bowel up to 5 cm concerning for small bowel obstruction. Small amount of scattered gas and stool remain present in the colon. There are stable visceral shadows. There is no evidence of free air. No pathologic calcifications. The lungs are generally clear aside from perihilar linear atelectatic changes on the left. No pleural effusion is seen. The cardiac size is normal. Right IJ port catheter tip remains at the cavoatrial junction. IMPRESSION: Increased dilatation of the left abdominal small bowel up to 5 cm concerning for small bowel obstruction. In all other respects no further changes. Electronically Signed   By: Telford Nab M.D.   On: 08/27/2021 02:38   DG Abd Portable 1V-Small Bowel Obstruction Protocol-24 hr delay  Result Date: 09/04/2021 CLINICAL DATA:  Small-bowel obstruction. Bowel obstruction protocol/24 delay image. EXAM: PORTABLE ABDOMEN - 1 VIEW COMPARISON:  Radiographs 09/03/2021 and 08/27/2021.  CT 09/02/2021. FINDINGS: 1120 hours. Two views obtained. Tip of the nasogastric tube projects over the distal  stomach. The enteric contrast has passed into the colon which appears decompressed. Previously noted small bowel distension is improved. No extraluminal contrast or air collections are identified. Telemetry leads overlie the chest and upper abdomen. IMPRESSION: Antegrade passage of contrast into decompressed colon with improved small bowel dilatation. No evidence of bowel obstruction or perforation. Electronically Signed   By: Richardean Sale M.D.   On: 09/04/2021 13:43   DG Abd Portable 1V-Small Bowel Obstruction Protocol-initial, 8 hr delay  Result Date: 09/03/2021 CLINICAL DATA:  Small-bowel obstruction. EXAM: PORTABLE ABDOMEN - 1 VIEW COMPARISON:  09/03/2021 FINDINGS: Nasogastric tube overlies expected mid body of the stomach. Contrast is seen within the gastric fundus. There has been little antegrade passage of contrast into the remainder of the abdomen. Multiple loops of gas-filled dilated small bowel are seen within the epigastrium and left abdomen in keeping with changes of a mid small bowel obstruction. No gross free intraperitoneal gas. No organomegaly. IMPRESSION: No significant antegrade passage of contrast from the gastric lumen. Persistent findings in keeping with a mid small bowel obstruction. Electronically Signed   By: Fidela Salisbury M.D.   On: 09/03/2021 19:48   DG Abd Portable 1V-Small Bowel Obstruction Protocol-initial, 8 hr delay  Result Date:  08/27/2021 CLINICAL DATA:  Small bowel obstruction.  History of colon cancer. EXAM: PORTABLE ABDOMEN - 1 VIEW COMPARISON:  Same day. FINDINGS: Residual contrast is seen through the nondilated colon. Mildly dilated small bowel loops are noted in the upper abdomen concerning for ileus or distal small bowel obstruction. IMPRESSION: Mildly dilated small bowel loops are again noted in upper abdomen concerning for distal small bowel obstruction. Residual contrast is noted in nondilated colon. Electronically Signed   By: Marijo Conception M.D.   On:  08/27/2021 21:41   [place] EKG Site Rite  Result Date: 09/18/2021 If Site Rite image not attached, placement could not be confirmed due to current cardiac rhythm.  [place] EKG SITE RITE  Result Date: 09/18/2021 If Site Rite image not attached, placement could not be confirmed due to current cardiac rhythm.   ***   Subjective: ***  Discharge Exam: Vitals:   09/22/21 2141 09/23/21 0530  BP: 135/89 131/84  Pulse: 82 87  Resp: 16 16  Temp: 97.8 F (36.6 C) 98.4 F (36.9 C)  SpO2: 95% 97%   Vitals:   09/22/21 0359 09/22/21 1413 09/22/21 2141 09/23/21 0530  BP: (!) 138/91 132/85 135/89 131/84  Pulse: 91 89 82 87  Resp: _0 Temp: 98.5 F (36.9 C) 97.9 F (36.6 C) 97.8 F (36.6 C) 98.4 F (36.9 C)  TempSrc: Oral Oral Oral Oral  SpO2: 94% 94% 95% 97%  Weight:      Height:        General: Pt is alert, awake, not in acute distress*** Cardiovascular: RRR, S1/S2 +, no rubs, no gallops Respiratory: CTA bilaterally, no wheezing, no rhonchi Abdominal: Soft, NT, ND, bowel sounds + Extremities: no edema, no cyanosis    The results of significant diagnostics from this hospitalization (including imaging, microbiology, ancillary and laboratory) are listed below for reference.     Microbiology: Recent Results (from the past 240 hour(s))  Resp Panel by RT-PCR (Flu A&B, Covid) Nasopharyngeal Swab     Status: None   Collection Time: 09/14/21  7:46 AM   Specimen: Nasopharyngeal Swab; Nasopharyngeal(NP) swabs in vial transport medium  Result Value Ref Range Status   SARS Coronavirus 2 by RT PCR NEGATIVE NEGATIVE Final    Comment: (NOTE) SARS-CoV-2 target nucleic acids are NOT DETECTED.  The SARS-CoV-2 RNA is generally detectable in upper respiratory specimens during the acute phase of infection. The lowest concentration of SARS-CoV-2 viral copies this assay can detect is 138 copies/mL. A negative result does not preclude SARS-Cov-2 infection and should not be used as the sole  basis for treatment or other patient management decisions. A negative result may occur with  improper specimen collection/handling, submission of specimen other than nasopharyngeal swab, presence of viral mutation(s) within the areas targeted by this assay, and inadequate number of viral copies(<138 copies/mL). A negative result must be combined with clinical observations, patient history, and epidemiological information. The expected result is Negative.  Fact Sheet for Patients:  EntrepreneurPulse.com.au  Fact Sheet for Healthcare Providers:  IncredibleEmployment.be  This test is no t yet approved or cleared by the [place] FDA and  has been authorized for detection and/or diagnosis of SARS-CoV-2 by FDA under an Emergency Use Authorization (EUA). This EUA will remain  in effect (meaning this test can be used) for the duration of the COVID-19 declaration under Section 564(b)(1) of the Act, 21 U.S.C.section 360bbb-3(b)(1), unless the authorization is terminated  or revoked sooner.       Influenza  A by PCR NEGATIVE NEGATIVE Final   Influenza B by PCR NEGATIVE NEGATIVE Final    Comment: (NOTE) The Xpert Xpress SARS-CoV-2/FLU/RSV plus assay is intended as an aid in the diagnosis of influenza from Nasopharyngeal swab specimens and should not be used as a sole basis for treatment. Nasal washings and aspirates are unacceptable for Xpert Xpress SARS-CoV-2/FLU/RSV testing.  Fact Sheet for Patients: EntrepreneurPulse.com.au  Fact Sheet for Healthcare Providers: IncredibleEmployment.be  This test is not yet approved or cleared by the [place] FDA and has been authorized for detection and/or diagnosis of SARS-CoV-2 by FDA under an Emergency Use Authorization (EUA). This EUA will remain in effect (meaning this test can be used) for the duration of the COVID-19 declaration under Section 564(b)(1) of the Act,  21 U.S.C. section 360bbb-3(b)(1), unless the authorization is terminated or revoked.  Performed at [facility], [place] [address] [place], [place] [address]      Labs: BNP (last 3 results) No results for input(s): BNP in the last 8760 hours. Basic Metabolic Panel: Recent Labs  Lab 09/18/21 0455 09/19/21 0500 09/20/21 0404 09/22/21 0315  NA 132* 131* 134* 135  K 3.9 4.7 4.1 4.1  CL 99 103 103 100  CO2 _0 GLUCOSE 108* 109* 116* 110*  BUN 13 21* 23* 22*  CREATININE 0.90 0.87 0.79 0.73  CALCIUM 8.0* 8.4* 8.0* 8.0*  MG 2.0 2.1 2.2 2.0  PHOS 4.1 3.9 4.2 4.0   Liver Function Tests: Recent Labs  Lab 09/18/21 0455 09/22/21 0315  AST 19 23  ALT 21 33  ALKPHOS 108 89  BILITOT 0.7 0.4  PROT 6.5 6.6  ALBUMIN 2.6* 2.5*   No results for input(s): LIPASE, AMYLASE in the last 168 hours. No results for input(s): AMMONIA in the last 168 hours. CBC: Recent Labs  Lab 09/17/21 0622  WBC 12.6*  HGB 11.1*  HCT 34.2*  MCV 90.5  PLT 277   Cardiac Enzymes: No results for input(s): CKTOTAL, CKMB, CKMBINDEX, TROPONINI in the last 168 hours. BNP: Invalid input(s): POCBNP CBG: Recent Labs  Lab 09/22/21 1633 09/22/21 2143 09/23/21 0003 09/23/21 0400 09/23/21 0801  GLUCAP 81 106* 105* 128* 92   D-Dimer No results for input(s): DDIMER in the last 72 hours. Hgb A1c No results for input(s): HGBA1C in the last 72 hours. Lipid Profile Recent Labs    09/22/21 0315  TRIG 52   Thyroid function studies No results for input(s): TSH, T4TOTAL, T3FREE, THYROIDAB in the last 72 hours.  Invalid input(s): FREET3 Anemia work up No results for input(s): VITAMINB12, FOLATE, FERRITIN, TIBC, IRON, RETICCTPCT in the last 72 hours. Urinalysis    Component Value Date/Time   COLORURINE YELLOW 09/03/2021 0800   APPEARANCEUR CLEAR 09/03/2021 0800   LABSPEC >1.046 (H) 09/03/2021 0800   PHURINE 5.0 09/03/2021 0800   GLUCOSEU NEGATIVE 09/03/2021 0800   HGBUR  NEGATIVE 09/03/2021 0800   BILIRUBINUR NEGATIVE 09/03/2021 0800   KETONESUR NEGATIVE 09/03/2021 0800   PROTEINUR NEGATIVE 09/03/2021 0800   NITRITE NEGATIVE 09/03/2021 0800   LEUKOCYTESUR NEGATIVE 09/03/2021 0800   Sepsis Labs Invalid input(s): PROCALCITONIN,  WBC,  LACTICIDVEN Microbiology Recent Results (from the past 240 hour(s))  Resp Panel by RT-PCR (Flu A&B, Covid) Nasopharyngeal Swab     Status: None   Collection Time: 09/14/21  7:46 AM   Specimen: Nasopharyngeal Swab; Nasopharyngeal(NP) swabs in vial transport medium  Result Value Ref Range Status   SARS Coronavirus 2 by RT PCR NEGATIVE NEGATIVE  Final    Comment: (NOTE) SARS-CoV-2 target nucleic acids are NOT DETECTED.  The SARS-CoV-2 RNA is generally detectable in upper respiratory specimens during the acute phase of infection. The lowest concentration of SARS-CoV-2 viral copies this assay can detect is 138 copies/mL. A negative result does not preclude SARS-Cov-2 infection and should not be used as the sole basis for treatment or other patient management decisions. A negative result may occur with  improper specimen collection/handling, submission of specimen other than nasopharyngeal swab, presence of viral mutation(s) within the areas targeted by this assay, and inadequate number of viral copies(<138 copies/mL). A negative result must be combined with clinical observations, patient history, and epidemiological information. The expected result is Negative.  Fact Sheet for Patients:  EntrepreneurPulse.com.au  Fact Sheet for Healthcare Providers:  IncredibleEmployment.be  This test is no t yet approved or cleared by the [place] FDA and  has been authorized for detection and/or diagnosis of SARS-CoV-2 by FDA under an Emergency Use Authorization (EUA). This EUA will remain  in effect (meaning this test can be used) for the duration of the COVID-19 declaration under Section  564(b)(1) of the Act, 21 U.S.C.section 360bbb-3(b)(1), unless the authorization is terminated  or revoked sooner.       Influenza A by PCR NEGATIVE NEGATIVE Final   Influenza B by PCR NEGATIVE NEGATIVE Final    Comment: (NOTE) The Xpert Xpress SARS-CoV-2/FLU/RSV plus assay is intended as an aid in the diagnosis of influenza from Nasopharyngeal swab specimens and should not be used as a sole basis for treatment. Nasal washings and aspirates are unacceptable for Xpert Xpress SARS-CoV-2/FLU/RSV testing.  Fact Sheet for Patients: EntrepreneurPulse.com.au  Fact Sheet for Healthcare Providers: IncredibleEmployment.be  This test is not yet approved or cleared by the [place] FDA and has been authorized for detection and/or diagnosis of SARS-CoV-2 by FDA under an Emergency Use Authorization (EUA). This EUA will remain in effect (meaning this test can be used) for the duration of the COVID-19 declaration under Section 564(b)(1) of the Act, 21 U.S.C. section 360bbb-3(b)(1), unless the authorization is terminated or revoked.  Performed at [facility], [place] [address], [place], [place] [address]      Time coordinating discharge: 35 minutes***  SIGNED:   Cordelia Poche, MD [facility] 09/23/2021, 10:09 AM

## 2021-09-23 NOTE — Progress Notes (Signed)
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  He was recently hospitalized and discharged on 09/13/21 with home TPN.  Pharmacy managed his TPN during that admission.  He presented back to the ED on 09/14/21 with c/o abdominal pain and N/V.  Pharmacy has been consulted to manage pt's TPN.  - pt was on cyclic (12 hr) TPN PTA. Patient's wife stated that last TPN bag hung on 09/13/21 at 7:30p and this was stopped on 09/14/21 at 7:30a  Glucose / Insulin: No hx DM. Hgb A1c 5.5% - CBGs and corrective insulin discontinued 1/9 with CBGs consistently at goal and very little correction insulin used despite steroids - on dexamethasone 4 mg PO daily started 1/3  - SoluMedrol 125mg  x1 on 1/5 as pre-med for chemo Electrolytes: labs 1/9: CorrCa and other lytes wnl Renal: 1/9: SCr <1; stable. BUN slightly elevated at 22 Hepatic: 1/9: LFTs WNL - albumin low Intake / Output; MIVF: No MIVF - unable to accurately assess w/ unmeasured occurrences/incomplete charting - Drain output:1250 ml/24 hr  GI Imaging: - 09/02/21 abd CT:  findings consistent with small-bowel obstruction, free fluid rt abdomen, small l pleural effusion, stable metastatic disease - 12/26 KUB: continued SBO pattern GI Surgeries / Procedures:  - 5/20 lap appy - 3/22 ex lap  w/ partial bowel resection and ostomy creation - 8/22 diagnostic laprascopy w/ peritoneal biopsies - 12/29: perc gastrostomy  Central access: implanted port 12/12/20                             PICC placement 1/5 TPN start date: 09/04/21 --> resumed on adm on 09/14/21  Nutritional  Goals: Goal TPN 2375mL/24hr (provides 125 g of protein and 2234 kcals per day)  RD Assessment (on 12/29) : Estimated Needs Total Energy Estimated Needs: 2200-2400 kcal Total Protein Estimated Needs: 115-125 grams Total Fluid Estimated Needs: >/= 2.4 L/day  Current Nutrition:  TPN NPO except for ice chips, sips with meds   Plan:  At 1800: Continue TPN cyclic rate over 12 hr, from 6pm to 6am Electrolytes in TPN: No changes Na 150 mEq/L K  30 mEq/L Ca 8mEq/L Mg 29mEq/L Phos 52mmol/L Cl:Ac 1:1 Add standard MVI and trace elements to TPN Discontinued CBGs and corrective insulin with CBGs consistently at goal and very little correction insulin used despite dexamethasone; looks like CBGs were resumed last PM. Unsure of rationale, no concern for hypoglycemia reported. Will continue to hold CBGs and SSI MD managing IVF --> currently not on IVF TPN labs Mon, Thurs BMET, Mg, and PHos in AM Initially planned to d/c today but delayed due to pending insurance prior authorizations  Ulice Dash, PharmD  09/23/2021 7:20 AM

## 2021-09-23 NOTE — Progress Notes (Signed)
° ° ° °  Chart Reviewed. Patient examined at bedside.   Mr. Judice is resting. Easily awaken. Reports better pain control. Is appreciative he is in a good place and plans are in motion to get him back home. Pam with [facility] is also on the unit making preparations to arrange for home delivery of TPN supplies. She is reaching out to wife with hopes of getting items delivered later today.   Pain  Fentanyl patch is at 150 mcg with Dilaudid 6mg  po every 3-4 hours as needed for breakthrough. He has not required any IV medication over the past 24-48 hours. Dilaudid oral total of 8 doses over 24 hours with time frame averaging 4-5 hours in between dosages. Decadron 4mg . Seems to be tolerating current regimen. Will plan to continue at discharge. I have sent prescriptions (Fentanyl and Dilaudid) to his pharmacy on file in preparation for discharge.   Constipation He is having small bowel movements which is an improvement. Tolerating liquid diet for comfort. Will continue current bowel regimen at discharge. Venting G-tube in place. Mr. Cypress states he is managing well and knows when to clamp and unclamp. Also listening to his body and any discomfort to guide him in tube maintenance.   Anxiety Is appreciative that he is close to going home. Recommend continuing Xanax PRN TID as prescribed while hospitalized at discharge.   We will plan to see him back in collaboration with Dr. Burr Medico at [facility]. I will schedule either a virtual visit or office visit to continue ongoing support and close evaluation for symptom management needs.   Mr. Trombly verbalized understanding of the plans and appreciation of care being received.    Alda Lea, AGPCNP-BC Palliative Medicine Team/[facility]   Pager: [phone] Amion: N. Cousar

## 2021-09-24 ENCOUNTER — Encounter: Payer: Self-pay | Admitting: Nurse Practitioner

## 2021-09-24 ENCOUNTER — Other Ambulatory Visit: Payer: Self-pay

## 2021-09-24 ENCOUNTER — Telehealth: Payer: Self-pay | Admitting: Hematology

## 2021-09-24 DIAGNOSIS — C189 Malignant neoplasm of colon, unspecified: Secondary | ICD-10-CM

## 2021-09-24 DIAGNOSIS — E669 Obesity, unspecified: Secondary | ICD-10-CM

## 2021-09-24 LAB — PHOSPHORUS: Phosphorus: 3.5 mg/dL (ref 2.5–4.6)

## 2021-09-24 LAB — GLUCOSE, CAPILLARY
Glucose-Capillary: 108 mg/dL — ABNORMAL HIGH (ref 70–99)
Glucose-Capillary: 130 mg/dL — ABNORMAL HIGH (ref 70–99)
Glucose-Capillary: 96 mg/dL (ref 70–99)

## 2021-09-24 LAB — BASIC METABOLIC PANEL
Anion gap: 5 (ref 5–15)
BUN: 21 mg/dL — ABNORMAL HIGH (ref 6–20)
CO2: 27 mmol/L (ref 22–32)
Calcium: 7.9 mg/dL — ABNORMAL LOW (ref 8.9–10.3)
Chloride: 98 mmol/L (ref 98–111)
Creatinine, Ser: 0.78 mg/dL (ref 0.61–1.24)
GFR, Estimated: >60 mL/min (ref >60)
Glucose, Bld: 107 mg/dL — ABNORMAL HIGH (ref 70–99)
Potassium: 4 mmol/L (ref 3.5–5.1)
Sodium: 130 mmol/L — ABNORMAL LOW (ref 135–145)

## 2021-09-24 LAB — MAGNESIUM: Magnesium: 2 mg/dL (ref 1.7–2.4)

## 2021-09-24 MED ORDER — HEPARIN SOD (PORK) LOCK FLUSH 100 UNIT/ML IV SOLN
500.0000 [IU] | Freq: Once | INTRAVENOUS | Status: AC
  Administered 2021-09-24: 500 [IU] via INTRAVENOUS
  Filled 2021-09-24: qty 5

## 2021-09-24 NOTE — Discharge Summary (Signed)
Physician Discharge Summary  Ricardo [name] FWY:[id number] DOB: May 09, 1966 DOA: 09/14/2021  PCP: [name], MD  Admit date: 09/14/2021 Discharge date: 09/24/2021  Admitted From: Home Disposition:  Home  Recommendations for Outpatient Follow-up:  Follow up with PCP in 1-2 weeks Follow up with Oncology as scheduled  Discharge Condition:Stable CODE STATUS:Partial Diet recommendation: NPO, TPN   Brief/Interim Summary: 56 y.o. 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 In the ED afebrile mildly tachycardic, labs with leukocytosis likely poor, stable hemoglobin and renal function Lumin low 2.7 Abdominal x-ray showed interval improvement in gaseous distention of the small and large bowel loops compared with 12/26 X-ray showed left lower lung opacity concern about aspiration placed on antibiotics   Discharge Diagnoses:  Principal Problem:   Intractable abdominal pain Active Problems:   Hyperlipidemia   Leukocytosis   GERD (gastroesophageal reflux disease)   GAD (generalized anxiety disorder)   Colon cancer metastasized to multiple sites [facility])   Class 2 obesity   Moderate protein malnutrition (HCC)   Normocytic anemia   Hyperglycemia   Peripherally inserted central catheter (PICC) in place   Palliative care by specialist   Goals of care, counseling/discussion  Intractable abdominal pain Nausea and vomiting Recurrent issue. Recently reinserted PEG tube placement for  venting. Palliative care consulted for pain management. Pain control is improving with analgesics. -Palliative care medicine recommendations: Fentanyl patch 75 mcg/hr x2 patches, Decadron, Dilaudid PO PRN   Aspiration pneumonia In setting of recent vomiting. Started on empiric Unasyn and has completed treatment.   Metastatic colon cancer Metastasis to multiple sites including peritoneum. Medical oncology consulted. Chemotherapy (FOLFIRINOX) infused on 1/5. PICC line placed on 09/18/21.   Anxiety -Continue Xanax prn   GERD -Continue Protonix   Moderate malnutrition TPN started this admission, PICC line now in place. -Continue TPN   Normocytic anemia Mild. Stable.   Hyperglycemia Hemoglobin A1C of 5.5%   Elevated blood pressure No diagnosis of hypertension -Pt given hydralazine PO prn   Obesity Body mass index is 36.22 kg/m.   Discharge Instructions  Discharge Instructions     BEVACIZUMAB TREATMENT CONDITIONS   Complete by: As directed    Check BP prior to each cycle of bevacizumab. HOLD if BP>160/100 and contact MD. Notify MD if patient has unstable vital signs: Temperature > 38.5, RR > 30 or HR > 100.   TREATMENT CONDITIONS   Complete by: As directed    Patient should have CBC & CMP within 7 days prior to chemotherapy administration. NOTIFY MD IF: ANC < 1500, Hemoglobin < 8, PLT < 100,000,  Total Bili > 1.5, Creatinine > 1.5, ALT & AST > 80 or if patient has unstable vital signs: Temperature > 38.5, SBP > 180 or < 90, RR > 30 or HR > 100.  Pt has hx of oxaliplatin reaction.  Add'l premeds added to careplan and oxailiplatin will be in 1075m bag and run over 4 hrs from C3 on.      Allergies as of 09/24/2021       Reactions   Oxaliplatin Hives,  Other (See Comments)   15 minutes into infusion, patient started to complain of feeling warm and states " it feels like the last time I had my reaction"        Medication List     STOP taking these medications     acetaminophen 500 MG tablet Commonly known as: TYLENOL   hydrocortisone 2.5 % rectal cream Commonly known as: Anusol-HC       TAKE these medications    ALPRAZolam 0.25 MG tablet Commonly known as: XANAX Take 1-2 tablets (0.25-0.5 mg total) by mouth at bedtime as needed for anxiety or sleep.   dexamethasone 4 MG tablet Commonly known as: DECADRON Take 1 tablet (4 mg total) by mouth daily.   fentaNYL 100 MCG/HR Commonly known as: Laurie 1 patch onto the skin every 3 (three) days.   fentaNYL 50 MCG/HR Commonly known as: Delta 1 patch onto the skin every 3 (three) days.   HYDROmorphone 2 MG tablet Commonly known as: Dilaudid Take 3 tablets (20m) every 3-4 hours as needed for breakthrough pain. What changed:  how much to take how to take this when to take this reasons to take this additional instructions   naloxone 4 MG/0.1ML Liqd nasal spray kit Commonly known as: NARCAN Use 1 spray for opioid overdose   OVER THE COUNTER MEDICATION Take 1 capsule by mouth daily. Immunocal   pantoprazole 40 MG tablet Commonly known as: Protonix Take 1 tablet (40 mg total) by mouth daily.   polyethylene glycol 17 g packet Commonly known as: MIRALAX / GLYCOLAX Take 17 g by mouth daily as needed for mild constipation.   prochlorperazine 10 MG tablet Commonly known as: COMPAZINE Take 1 tablet (10 mg total) by mouth every 6 (six) hours as needed for nausea or vomiting.        Follow-up Information     FTruitt Merle MD. Schedule an appointment as soon as possible for a visit in 1 week(s).   Specialties: Hematology, Oncology Why: For hospital follow-up Contact information: [address] [name] [phone]         Pickenpack-Cousar, Athena N, NP Follow up in 1 week(s).   Specialty: Nurse Practitioner Why: For hospital follow-up Contact information: [address] [phone]                Allergies  Allergen Reactions   Oxaliplatin Hives and Other (See Comments)    15 minutes into infusion, patient started to complain of feeling warm and states " it feels like the last time I had my reaction"    Consultations: Palliative Care Oncology  Procedures/Studies: CT ABDOMEN PELVIS WO CONTRAST  Result Date: 08/27/2021 CLINICAL DATA:  Small-bowel obstruction with left lower quadrant pain. Leukocytosis also. Metastatic colon cancer. Ongoing chemotherapy. EXAM: CT ABDOMEN AND PELVIS WITHOUT CONTRAST TECHNIQUE: Multidetector CT imaging of the abdomen and pelvis was performed following the standard protocol without IV contrast. COMPARISON:  The recent CT with IV contrast 08/25/2021, CT abdomen and pelvis with IV and oral contrast 07/01/2021. FINDINGS: Lower chest: There is slight increased prominence of a few scattered epipericardial lymph nodes compared with 07/01/2021. No acute lung base findings. Hepatobiliary: No focal liver abnormality is seen. No gallstones, gallbladder wall thickening, or biliary dilatation. Pancreas: Unremarkable. No pancreatic ductal dilatation or surrounding inflammatory changes. Spleen: Unremarkable without contrast. Adrenals/Urinary Tract: No focal abnormality in the adrenal glands and renal cortex. No urinary stones or obstruction. Chronic perinephric stranding  is similar. Normal bladder thickness. Stomach/Bowel: There is increasing dilatation of left upper to mid abdominal small bowel and anterior central small bowel up to 5 cm, although there is still relative decompressed appearance in the stomach and duodenum. The dilatation begins to be seen distal to the ligament of Treitz. The exact transition point could not be found but I suspect is probably related to abdominal wall adhesive disease with multiple small bowel segments closely abutting the anterior wall on multiple slices. The transitional segment is probably in the right mid to lower abdomen anteriorly.  The appendix surgically absent. Rest of the small bowel is decompressed, some of it apparently having been removed. The large intestine wall is unremarkable except for uncomplicated diverticula along the left segments. Vascular/Lymphatic: There is mild aortoiliac calcific plaque. No enlarged lymph nodes are seen. There is a stable subcentimeter to borderline sized ileocolic mesenteric lymph node in the right lower abdomen on axial 50. Reproductive: Normal prostate. Other: Diffuse haziness in omentum extends to the subphrenic space on the left. There is increased minimal ascites in the perihepatic space and right anterior abdominal mesenteric folds. There are small inguinal fat hernias. Musculoskeletal: Chronic L5 pars defects and grade 1 L5 spondylolisthesis. No destructive bone lesions. 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 IMPRESSION: 1. Intermediate to high-grade obstruction to approximately the mid small bowel, etiology is probably adhesive disease and the transition is most likely in the anterior right mid to lower abdomen along the abdominal wall. The exact transitional segment could not be located without contrast. 2. Slight increased prominence of a few epipericardial lymph nodes. Small stable ileocolic mesenteric node. 3. Diffuse omental haziness extending into the left subphrenic space concerning for metastatic disease, with small volume of ascites. No free air. 4. Aortic atherosclerosis.  Additional findings as above. Electronically Signed   By: Telford Nab M.D.   On: 08/27/2021 04:19   X-ray chest PA and lateral  Result Date: 09/14/2021 CLINICAL DATA:  56 year old male with a history of colon cancer, recurrent SBO s/p venting gastrostomy tube, who presents to the emergency department today for evaluation of abdominal pain. Of note, patient recently admitted to the hospital for recurrent SBO and had a gastrostomy tube placed. He was actually discharged from the hospital  yesterday. Since then he states he has been draining his PEG tube and he continues to have a significant amount of output so he has not drained in several hours. He is now complaining of severe abdominal pain, distention, nausea vomiting. EXAM: CHEST - 2 VIEW COMPARISON:  01/04/2021. FINDINGS: Increased opacity at the left lung base compared to the prior exam, consistent with atelectasis, pneumonia or a combination. Remainder of the lungs is clear. Small left pleural effusion.  No pneumothorax. Cardiac silhouette normal in size. No mediastinal or hilar masses or evidence of adenopathy. Stable right internal jugular Port-A-Cath. Skeletal structures are grossly intact. IMPRESSION: 1. Left lower lung opacity consistent with a combination of atelectasis/pneumonia and a small effusion, new compared to the prior chest radiograph. Electronically Signed   By: Lajean Manes M.D.   On: 09/14/2021 10:59   Abd 1 View (KUB)  Result Date: 09/14/2021 CLINICAL DATA:  History of colon cancer with recurrent small bowel obstruction. Patient presents with abdominal pain. EXAM: ABDOMEN - 1 VIEW COMPARISON:  09/08/21 FINDINGS: Left upper quadrant gastrostomy tube identified. Since the previous exam there is been interval improvement in gaseous distension the of the small and large bowel loops. No new findings. IMPRESSION: Interval improvement  in gaseous distension of the small and large bowel loops compared with 09/08/2021. Electronically Signed   By: Kerby Moors M.D.   On: 09/14/2021 10:57   DG Abd 1 View  Result Date: 09/08/2021 CLINICAL DATA:  Abdominal pain, nausea, vomiting EXAM: ABDOMEN - 1 VIEW COMPARISON:  09/07/2021 FINDINGS: Prominent small bowel loops centrally again noted, similar prior study compatible with persistent small bowel obstruction. No free air or organomegaly. Visualized lung bases clear. IMPRESSION: Continued small bowel obstruction pattern, not significantly changed. Electronically Signed   By: Rolm Baptise M.D.   On: 09/08/2021 15:12   DG Abd 1 View  Result Date: 09/07/2021 CLINICAL DATA:  Nausea and vomiting; EXAM: ABDOMEN - 1 VIEW COMPARISON:  September 04, 2021 ;September 03, 2021; September 02, 2021 FINDINGS: There are a few mildly dilated loops of bowel in the upper abdomen, improved in comparison to prior from September 03, 2021. Interval removal of enteric tube. Partial visualization of CVC tip terminating over the region of the superior cavoatrial junction. No bowel gas visualized in the rectum. IMPRESSION: Persistent mild dilation of several loops of small bowel in the upper abdomen, overall improved since September 03, 2021. This could reflect persistent small-bowel obstruction. Electronically Signed   By: Valentino Saxon M.D.   On: 09/07/2021 14:00   DG Abdomen 1 View  Result Date: 09/03/2021 CLINICAL DATA:  Nasogastric tube placement EXAM: ABDOMEN - 1 VIEW COMPARISON:  None. FINDINGS: Nasogastric tube tip overlies the expected mid body of the stomach. Multiple gas-filled dilated loops of small bowel are seen within the mid abdomen and left upper quadrant. No free intraperitoneal gas. IMPRESSION: Nasogastric tube tip within the mid body of the stomach. Electronically Signed   By: Fidela Salisbury M.D.   On: 09/03/2021 00:51   CT ABDOMEN PELVIS W CONTRAST  Result Date: 09/02/2021 CLINICAL DATA:  Bowel obstruction suspected. Abdominal pain. Chemotherapy. Three weeks ago. EXAM: CT ABDOMEN AND PELVIS WITH CONTRAST TECHNIQUE: Multidetector CT imaging of the abdomen and pelvis was performed using the standard protocol following bolus administration of intravenous contrast. CONTRAST:  81mL OMNIPAQUE IOHEXOL 350 MG/ML SOLN COMPARISON:  CT abdomen and pelvis 08/27/2021. FINDINGS: Lower chest: There is a new trace left pleural effusion. There is atelectasis in the bilateral lung bases. Hepatobiliary: No focal liver abnormality is seen. No gallstones, gallbladder wall thickening, or biliary  dilatation. Pancreas: Unremarkable. No pancreatic ductal dilatation or surrounding inflammatory changes. Spleen: Normal in size without focal abnormality. Adrenals/Urinary Tract: Adrenal glands are unremarkable. Kidneys are normal, without renal calculi, focal lesion, or hydronephrosis. Bladder is unremarkable. Stomach/Bowel: There are dilated mid and proximal small bowel loops with air-fluid levels measuring up to 5 cm. Degree of distention has mildly increased. There is also new moderate air-fluid level within the stomach. Transition point is likely in the mid abdomen where there are multiple small bowel loops demonstrating adhesive disease to the anterior abdominal wall. This finding is unchanged. The colon is nondilated. The appendix is not seen. There is no pneumatosis or free air identified. Vascular/Lymphatic: Aortic atherosclerosis. No enlarged abdominal or pelvic lymph nodes. Nonenlarged cardiophrenic and ileocecal lymph nodes are unchanged. Reproductive: Prostate is unremarkable. Other: There are small fat containing bilateral inguinal hernias. There is a small amount of free fluid in the anterior right abdomen which has mildly increased. There is mesenteric/omental haziness in the left upper quadrant and anterior upper abdomen similar to the prior examination. Musculoskeletal: There are bilateral pars interarticularis defects at L5 without significant listhesis. IMPRESSION: 1.  Again seen are findings of small-bowel obstruction. Transition point is likely in the mid abdomen where there are clustered small bowel loops adhered to the anterior abdominal wall. Can not exclude underlying obstructive lesion. Degree of distention has increased when compared to the prior exam. There is now air-fluid level in the stomach. 2. Increasing free fluid in the right abdomen. 3. New small left pleural effusion. 4. Stable diffuse omental haziness is unchanged, concerning for metastatic disease. Electronically Signed   By:  Ronney Asters M.D.   On: 09/02/2021 20:30   IR GASTROSTOMY TUBE MOD SED  Result Date: 09/11/2021 CLINICAL DATA:  History of metastatic colon carcinoma with persistent small-bowel obstruction and request to place a venting gastrostomy tube for symptomatic relief. EXAM: PERCUTANEOUS GASTROSTOMY TUBE PLACEMENT ANESTHESIA/SEDATION: Moderate (conscious) sedation was employed during this procedure. A total of Versed 4.0 mg and Fentanyl 100 mcg was administered intravenously by radiology nursing. Moderate Sedation Time: 15 minutes. The patient's level of consciousness and vital signs were monitored continuously by radiology nursing throughout the procedure under my direct supervision. CONTRAST:  52m OMNIPAQUE IOHEXOL 300 MG/ML  SOLN MEDICATIONS: 2 g IV Ancef. IV antibiotic was administered in an appropriate time interval prior to needle puncture of the skin. During the procedure the patient received 0.5 mg IV glucagon. FLUOROSCOPY TIME:  2 minutes and 24 seconds.  51.0 mGy. PROCEDURE: The procedure, risks, benefits, and alternatives were explained to the patient. Questions regarding the procedure were encouraged and answered. The patient understands and consents to the procedure. A time-out was performed prior to initiating the procedure. A 5-French catheter was advanced through the patient's mouth under fluoroscopy into the esophagus and to the level of the stomach. This catheter was used to insufflate the stomach with air under fluoroscopy. The abdominal wall was prepped with chlorhexidine in a sterile fashion, and a sterile drape was applied covering the operative field. A sterile gown and sterile gloves were used for the procedure. Local anesthesia was provided with 1% Lidocaine. A skin incision was made in the upper abdominal wall. Under fluoroscopy, an 18 gauge trocar needle was advanced into the stomach. Contrast injection was performed to confirm intraluminal position of the needle tip. A single T tack was  then deployed in the lumen of the stomach. This was brought up to tension at the skin surface. Over a guidewire, a 9-French sheath was advanced into the lumen of the stomach. The wire was left in place as a safety wire. A loop snare device from a percutaneous gastrostomy kit was then advanced into the stomach. A floppy guide wire was advanced through the orogastric catheter under fluoroscopy in the stomach. The loop snare advanced through the percutaneous gastric access was used to snare the guide wire. This allowed withdrawal of the loop snare out of the patient's mouth by retraction of the orogastric catheter and wire. A 20-French bumper retention gastrostomy tube was looped around the snare device. It was then pulled back through the patient's mouth. The retention bumper was brought up to the anterior gastric wall. The T tack suture was cut at the skin. The exiting gastrostomy tube was cut to appropriate length and a feeding adapter applied. The catheter was injected with contrast material to confirm position and a fluoroscopic spot image saved. The tube was then flushed with saline. A dressing was applied over the gastrostomy exit site. COMPLICATIONS: None. FINDINGS: The stomach distended well with air allowing safe placement of the gastrostomy tube. After placement, the tip of the  gastrostomy tube lies in the body of the stomach. IMPRESSION: Percutaneous gastrostomy with placement of a 20-French bumper retention tube in the body of the stomach. Electronically Signed   By: Aletta Edouard M.D.   On: 09/11/2021 16:11   DG CHEST PORT 1 VIEW  Result Date: 09/18/2021 CLINICAL DATA:  Left PICC placement EXAM: PORTABLE CHEST 1 VIEW COMPARISON:  09/14/2021 FINDINGS: Single frontal view of the chest demonstrates stable right chest wall port. Left-sided PICC tip overlies the superior vena cava. Cardiac silhouette is stable. Stable left basilar consolidation. Small effusion is suspected. Right chest is clear. No  pneumothorax. IMPRESSION: 1. Left-sided PICC tip overlying superior vena cava. 2. Continued left basilar consolidation and likely small effusion. Electronically Signed   By: Randa Ngo M.D.   On: 09/18/2021 17:32   DG ABD ACUTE 2+V W 1V CHEST  Result Date: 08/27/2021 CLINICAL DATA:  Metastatic colon cancer. Abdominal pain left lower quadrant with vomiting. EXAM: DG ABDOMEN ACUTE WITH 1 VIEW CHEST COMPARISON:  CT with IV contrast 08/25/2021 FINDINGS: There is increased dilatation of left mid to lower abdominal small bowel up to 5 cm concerning for small bowel obstruction. Small amount of scattered gas and stool remain present in the colon. There are stable visceral shadows. There is no evidence of free air. No pathologic calcifications. The lungs are generally clear aside from perihilar linear atelectatic changes on the left. No pleural effusion is seen. The cardiac size is normal. Right IJ port catheter tip remains at the cavoatrial junction. IMPRESSION: Increased dilatation of the left abdominal small bowel up to 5 cm concerning for small bowel obstruction. In all other respects no further changes. Electronically Signed   By: Telford Nab M.D.   On: 08/27/2021 02:38   DG Abd Portable 1V-Small Bowel Obstruction Protocol-24 hr delay  Result Date: 09/04/2021 CLINICAL DATA:  Small-bowel obstruction. Bowel obstruction protocol/24 delay image. EXAM: PORTABLE ABDOMEN - 1 VIEW COMPARISON:  Radiographs 09/03/2021 and 08/27/2021.  CT 09/02/2021. FINDINGS: 1120 hours. Two views obtained. Tip of the nasogastric tube projects over the distal stomach. The enteric contrast has passed into the colon which appears decompressed. Previously noted small bowel distension is improved. No extraluminal contrast or air collections are identified. Telemetry leads overlie the chest and upper abdomen. IMPRESSION: Antegrade passage of contrast into decompressed colon with improved small bowel dilatation. No evidence of bowel  obstruction or perforation. Electronically Signed   By: Richardean Sale M.D.   On: 09/04/2021 13:43   DG Abd Portable 1V-Small Bowel Obstruction Protocol-initial, 8 hr delay  Result Date: 09/03/2021 CLINICAL DATA:  Small-bowel obstruction. EXAM: PORTABLE ABDOMEN - 1 VIEW COMPARISON:  09/03/2021 FINDINGS: Nasogastric tube overlies expected mid body of the stomach. Contrast is seen within the gastric fundus. There has been little antegrade passage of contrast into the remainder of the abdomen. Multiple loops of gas-filled dilated small bowel are seen within the epigastrium and left abdomen in keeping with changes of a mid small bowel obstruction. No gross free intraperitoneal gas. No organomegaly. IMPRESSION: No significant antegrade passage of contrast from the gastric lumen. Persistent findings in keeping with a mid small bowel obstruction. Electronically Signed   By: Fidela Salisbury M.D.   On: 09/03/2021 19:48   DG Abd Portable 1V-Small Bowel Obstruction Protocol-initial, 8 hr delay  Result Date: 08/27/2021 CLINICAL DATA:  Small bowel obstruction.  History of colon cancer. EXAM: PORTABLE ABDOMEN - 1 VIEW COMPARISON:  Same day. FINDINGS: Residual contrast is seen through the nondilated colon. Mildly  dilated small bowel loops are noted in the upper abdomen concerning for ileus or distal small bowel obstruction. IMPRESSION: Mildly dilated small bowel loops are again noted in upper abdomen concerning for distal small bowel obstruction. Residual contrast is noted in nondilated colon. Electronically Signed   By: Marijo Conception M.D.   On: 08/27/2021 21:41   [place] EKG Site Rite  Result Date: 09/18/2021 If Site Rite image not attached, placement could not be confirmed due to current cardiac rhythm.  [place] EKG SITE RITE  Result Date: 09/18/2021 If Site Rite image not attached, placement could not be confirmed due to current cardiac rhythm.   Subjective: Eager to go home  Discharge Exam: Vitals:   09/23/21  1950 09/24/21 0406  BP: 134/83 (!) 161/88  Pulse: 82 92  Resp: 18 16  Temp: 97.9 F (36.6 C) 98.3 F (36.8 C)  SpO2: 94% 95%   Vitals:   09/23/21 0530 09/23/21 1725 09/23/21 1950 09/24/21 0406  BP: 131/84 (!) 141/90 134/83 (!) 161/88  Pulse: 87 80 82 92  Resp: _0 Temp: 98.4 F (36.9 C) 97.7 F (36.5 C) 97.9 F (36.6 C) 98.3 F (36.8 C)  TempSrc: Oral Oral Oral Oral  SpO2: 97% 95% 94% 95%  Weight:      Height:        General: Pt is alert, awake, not in acute distress Cardiovascular: RRR, S1/S2 + Respiratory: CTA bilaterally, no wheezing, no rhonchi Abdominal: Soft, NT, ND, bowel sounds + Extremities: no edema, no cyanosis   The results of significant diagnostics from this hospitalization (including imaging, microbiology, ancillary and laboratory) are listed below for reference.     Microbiology: No results found for this or any previous visit (from the past 240 hour(s)).   Labs: BNP (last 3 results) No results for input(s): BNP in the last 8760 hours. Basic Metabolic Panel: Recent Labs  Lab 09/18/21 0455 09/19/21 0500 09/20/21 0404 09/22/21 0315 09/24/21 0606  NA 132* 131* 134* 135 130*  K 3.9 4.7 4.1 4.1 4.0  CL 99 103 103 100 98  CO2 _1 GLUCOSE 108* 109* 116* 110* 107*  BUN 13 21* 23* 22* 21*  CREATININE 0.90 0.87 0.79 0.73 0.78  CALCIUM 8.0* 8.4* 8.0* 8.0* 7.9*  MG 2.0 2.1 2.2 2.0 2.0  PHOS 4.1 3.9 4.2 4.0 3.5   Liver Function Tests: Recent Labs  Lab 09/18/21 0455 09/22/21 0315  AST 19 23  ALT 21 33  ALKPHOS 108 89  BILITOT 0.7 0.4  PROT 6.5 6.6  ALBUMIN 2.6* 2.5*   No results for input(s): LIPASE, AMYLASE in the last 168 hours. No results for input(s): AMMONIA in the last 168 hours. CBC: No results for input(s): WBC, NEUTROABS, HGB, HCT, MCV, PLT in the last 168 hours. Cardiac Enzymes: No results for input(s): CKTOTAL, CKMB, CKMBINDEX, TROPONINI in the last 168 hours. BNP: Invalid input(s): POCBNP CBG: Recent  Labs  Lab 09/23/21 1722 09/23/21 1954 09/24/21 0000 09/24/21 0408 09/24/21 0755  GLUCAP 74 130* 108* 130* 96   D-Dimer No results for input(s): DDIMER in the last 72 hours. Hgb A1c No results for input(s): HGBA1C in the last 72 hours. Lipid Profile Recent Labs    09/22/21 0315  TRIG 52   Thyroid function studies No results for input(s): TSH, T4TOTAL, T3FREE, THYROIDAB in the last 72 hours.  Invalid input(s): FREET3 Anemia work up No results for input(s): VITAMINB12, FOLATE, FERRITIN, TIBC, IRON, RETICCTPCT in the  last 72 hours. Urinalysis    Component Value Date/Time   COLORURINE YELLOW 09/03/2021 0800   APPEARANCEUR CLEAR 09/03/2021 0800   LABSPEC >1.046 (H) 09/03/2021 0800   PHURINE 5.0 09/03/2021 0800   GLUCOSEU NEGATIVE 09/03/2021 0800   HGBUR NEGATIVE 09/03/2021 0800   BILIRUBINUR NEGATIVE 09/03/2021 0800   KETONESUR NEGATIVE 09/03/2021 0800   PROTEINUR NEGATIVE 09/03/2021 0800   NITRITE NEGATIVE 09/03/2021 0800   LEUKOCYTESUR NEGATIVE 09/03/2021 0800   Sepsis Labs Invalid input(s): PROCALCITONIN,  WBC,  LACTICIDVEN Microbiology No results found for this or any previous visit (from the past 240 hour(s)).  Time spent: 29mn  SIGNED:   SMarylu Lund MD  [facility] 09/24/2021, 11:06 AM  If 7PM-7AM, please contact night-coverage

## 2021-09-24 NOTE — Progress Notes (Signed)
Patient's Fentanyl 100 mcg patches and 50 mcg patches have been approved 09/23/2021-09/23/2022 via [facility].

## 2021-09-24 NOTE — Telephone Encounter (Signed)
Sch per 1/10 inbasket, left msg

## 2021-09-25 ENCOUNTER — Other Ambulatory Visit: Payer: Self-pay

## 2021-09-25 DIAGNOSIS — C182 Malignant neoplasm of ascending colon: Secondary | ICD-10-CM

## 2021-09-25 DIAGNOSIS — C772 Secondary and unspecified malignant neoplasm of intra-abdominal lymph nodes: Secondary | ICD-10-CM

## 2021-09-25 NOTE — Progress Notes (Signed)
LVM regarding appt for PICC placement on 09/30/2021 at [facility] @3pm .  Dr. Burr Medico would like labs drawn during PICC placement.  Contacted [facility] [place] [phone]) to see if labs can be drawn during PICC placement.  LVM with request for lab drawn awaiting return call from [place] in [place].  [facility] returned call and labs will be drawn while in IR.

## 2021-09-26 ENCOUNTER — Other Ambulatory Visit: Payer: Self-pay | Admitting: Hematology

## 2021-09-26 ENCOUNTER — Telehealth: Payer: Self-pay

## 2021-09-26 MED ORDER — PROCHLORPERAZINE MALEATE 10 MG PO TABS: 10.0000 mg | ORAL_TABLET | Freq: Four times a day (QID) | ORAL | 2 refills | Status: DC | PRN

## 2021-09-26 NOTE — Telephone Encounter (Signed)
Transition Care Management Unsuccessful Follow-up Telephone Call  Date of discharge and from where:  09/24/2021  [facility]   Attempts:  1st Attempt  Reason for unsuccessful TCM follow-up call:  Unable to reach patient

## 2021-09-29 ENCOUNTER — Encounter: Payer: Self-pay | Admitting: Nurse Practitioner

## 2021-09-29 ENCOUNTER — Telehealth: Payer: Self-pay

## 2021-09-29 ENCOUNTER — Other Ambulatory Visit: Payer: Self-pay

## 2021-09-29 ENCOUNTER — Inpatient Hospital Stay: Payer: BC Managed Care – PPO | Attending: Nurse Practitioner | Admitting: Nurse Practitioner

## 2021-09-29 DIAGNOSIS — R109 Unspecified abdominal pain: Secondary | ICD-10-CM | POA: Diagnosis not present

## 2021-09-29 DIAGNOSIS — Z515 Encounter for palliative care: Secondary | ICD-10-CM

## 2021-09-29 DIAGNOSIS — C180 Malignant neoplasm of cecum: Secondary | ICD-10-CM | POA: Diagnosis not present

## 2021-09-29 DIAGNOSIS — C189 Malignant neoplasm of colon, unspecified: Secondary | ICD-10-CM | POA: Diagnosis not present

## 2021-09-29 DIAGNOSIS — K5900 Constipation, unspecified: Secondary | ICD-10-CM

## 2021-09-29 DIAGNOSIS — G893 Neoplasm related pain (acute) (chronic): Secondary | ICD-10-CM

## 2021-09-29 DIAGNOSIS — R531 Weakness: Secondary | ICD-10-CM

## 2021-09-29 DIAGNOSIS — R110 Nausea: Secondary | ICD-10-CM

## 2021-09-29 MED ORDER — FENTANYL 25 MCG/HR TD PT72: 1.0000 | MEDICATED_PATCH | TRANSDERMAL | 0 refills | Status: DC

## 2021-09-29 MED ORDER — ALPRAZOLAM 0.25 MG PO TABS: 0.2500 mg | ORAL_TABLET | Freq: Three times a day (TID) | ORAL | 0 refills | Status: DC | PRN

## 2021-09-29 MED ORDER — PANTOPRAZOLE SODIUM 40 MG PO TBEC: 40.0000 mg | DELAYED_RELEASE_TABLET | Freq: Two times a day (BID) | ORAL | 1 refills | Status: DC

## 2021-09-29 NOTE — Progress Notes (Signed)
[place]  [phone] [phone]   Name: Ricardo [name] Date: 09/29/2021 MRN: [id number]  DOB: February 12, 1966  Patient Care Team: [name], MD as PCP - General (Internal Medicine) [name], MD as Consulting Physician (Oncology) [name], RN (Inactive) as Oncology Nurse Navigator   I connected with Ricardo [name] on 09/29/21 at  2:15 PM EST by My Chart visit and verified that I am speaking with the correct person using two identifiers.   I discussed the limitations, risks, security and privacy concerns of performing an evaluation and management service by telemedicine and the availability of in-person appointments. I also discussed with the patient that there may be a patient responsible charge related to this service. The patient expressed understanding and agreed to proceed.   Other persons participating in the visit and their role in the encounter: Ricardo [name] (Wife)   Patients location: Home  Providers location: [place]    Chief Complaint: Ongoing pain and nausea   INTERVAL HISTORY: Ricardo [name] is a 56 y.o. 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 Patient recently hospitalized 12/14 and 12/20 for SBO which was improved by conservative measures. CT of abdomen pelvis showed small bowel obstruction with transition point likely in the mid abdomen. Venting G-tube was placed prior to discharge. [name] was discharged on 09/13/2021 and required readmission on 09/14/2021 via EMS from home with similar concerns of abdominal pain, nausea, and vomiting. Imaging showes improvement in gaseous distention, however continues to have significant G-tube output, abdominal pain, nausea, and vomiting.   SOCIAL HISTORY:     reports that he has never smoked. He has never used smokeless tobacco. He reports that he does not currently use alcohol. He  reports that he does not use drugs.    PAST MEDICAL HISTORY: Past Medical History:  Diagnosis Date   Arthritis    Class 2 obesity 09/14/2021   Colon cancer ([place])    with metastasis   Family history of adverse reaction to anesthesia    mother had problem with it due to her asthma   Family history of breast cancer    Family history of prostate cancer    GAD (generalized anxiety disorder) 09/03/2021   GERD (gastroesophageal reflux disease) 09/03/2021   History of COVID-19 04/30/2021   Formatting of this note might be different from the original. 01/2021, asymptomatic   Hyperlipidemia 11/27/2020   Pleural effusion, left 09/06/2021   Sleep disorder 11/10/2018    ALLERGIES:  is allergic to oxaliplatin.  MEDICATIONS:  Current Outpatient Medications  Medication Sig Dispense Refill   fentaNYL (DURAGESIC) 25 MCG/HR Place 1 patch onto the skin every 3 (three) days. 10 patch 0   ALPRAZolam (XANAX) 0.25 MG tablet Take 1 tablet (0.25 mg total) by mouth 3 (three) times daily as needed for anxiety or sleep. 60 tablet 0   dexamethasone (DECADRON) 4 MG tablet Take 1 tablet (4 mg total) by mouth daily. 30 tablet 0   fentaNYL (DURAGESIC) 100 MCG/HR Place 1 patch onto the skin every 3 (three) days. 10 patch 0   fentaNYL (DURAGESIC) 50 MCG/HR Place 1 patch onto the skin every 3 (three) days. 10 patch 0   HYDROmorphone (DILAUDID) 2 MG tablet Take 3 tablets (6mg ) every 3-4 hours as needed for breakthrough pain. 180 tablet 0   naloxone (NARCAN) nasal spray 4 mg/0.1 mL Use 1 spray for opioid overdose 2 each  1   OVER THE COUNTER MEDICATION Take 1 capsule by mouth daily. Immunocal     pantoprazole (PROTONIX) 40 MG tablet Take 1 tablet (40 mg total) by mouth 2 (two) times daily. 60 tablet 1   polyethylene glycol (MIRALAX / GLYCOLAX) 17 g packet Take 17 g by mouth daily as needed for mild constipation. (Patient not taking: Reported on 09/03/2021)     prochlorperazine (COMPAZINE) 10 MG tablet Take 1 tablet (10 mg  total) by mouth every 6 (six) hours as needed for nausea or vomiting. 30 tablet 2   Current Facility-Administered Medications  Medication Dose Route Frequency Provider Last Rate Last Admin   0.9 %  sodium chloride infusion   Intravenous PRN Causey, Charlestine Massed, NP        VITAL SIGNS: There were no vitals taken for this visit. There were no vitals filed for this visit.  Estimated body mass index is 36.22 kg/m as calculated from the following:   Height as of 09/14/21: 5\' 11"  (1.803 m).   Weight as of 09/14/21: 259 lb 11.2 oz (117.8 kg).   PERFORMANCE STATUS (ECOG) : 2 - Symptomatic, <50% confined to bed   Physical Exam General: NAD Neurological: AAOx3  IMPRESSION:  I followed-up with [name] and his wife Ricardo [name] today regarding ongoing pain and nausea concerns. Cashtyn reports he has been doing well since his discharge from the hospital on 1/11.  Pain seem well controlled until approximately 3 days ago when he began to escalate despite our efforts.  He also expresses concerns with nausea and vomiting in the late evening and night which she contributes to increased acid reflux.  He is currently receiving TPN in the home and feels that some of his symptoms are directly related to this treatment.  He has been able to tolerate liquids throughout the day again with emphasis on feeling his worse during the late evening/night hours.  We discussed at length his current regimen and how to best achieve ongoing effectiveness.  His plans are to return to the clinic on Wednesday for his next scheduled treatment   Abdominal pain related to neoplasm Current regimen consists of 150 mcg of fentanyl patch.  Will increase fentanyl patch by 25 mcg (175 mcg).  Patient is aware the goal is to hopefully gain a better sustainable pain control without treating symptoms consistently with breakthrough medications. Continue oral Dilaudid as needed for breakthrough pain              2.     Nausea/vomiting Compazine 10 mg as needed for nausea Patient reports increased feelings of acid reflux.  Currently taking pantoprazole 40 mg daily.  Advised to increase to twice daily.  If he continues to have concerns with nausea and vomiting could consider transitioning Xanax to Ativan for additional symptomatic support.    3. Anxiety Xanax 0.25 3 times daily as needed.    4.  Constipation Patient reports he is having daily bowel movements.  Sometimes 2-3 times per day which is an improvement.   PLAN: Adjustments made to medication regimen (see above) Ongoing support and close evaluation of effective symptom management.  Patient is scheduled to return to clinic 1/18 for his next scheduled chemotherapy treatment.  We will check on him at that time to see how he is doing. I will plan to see patient back in the clinic 2-3 weeks with future oncology appointments.   Patient expressed understanding and was in agreement with this plan. He also understands that He  can call the clinic at any time with any questions, concerns, or complaints.   Time Total: 40 min  Visit consisted of counseling and education dealing with the complex and emotionally intense issues of symptom management and palliative care in the setting of serious and potentially life-threatening illness.Greater than 50%  of this time was spent counseling and coordinating care related to the above assessment and plan.  Signed by: Alda Lea, AGPCNP-BC [place]

## 2021-09-30 ENCOUNTER — Inpatient Hospital Stay (HOSPITAL_COMMUNITY)
Admission: EM | Admit: 2021-09-30 | Discharge: 2021-10-14 | DRG: 375 | Disposition: A | Discharge status: Home Health | Payer: BC Managed Care – PPO | Attending: Internal Medicine | Admitting: Internal Medicine

## 2021-09-30 ENCOUNTER — Observation Stay: Payer: Self-pay

## 2021-09-30 ENCOUNTER — Other Ambulatory Visit (HOSPITAL_COMMUNITY): Payer: BC Managed Care – PPO

## 2021-09-30 ENCOUNTER — Other Ambulatory Visit: Payer: Self-pay

## 2021-09-30 ENCOUNTER — Other Ambulatory Visit: Payer: BC Managed Care – PPO

## 2021-09-30 ENCOUNTER — Emergency Department (HOSPITAL_COMMUNITY): Payer: BC Managed Care – PPO

## 2021-09-30 ENCOUNTER — Encounter (HOSPITAL_COMMUNITY): Payer: Self-pay

## 2021-09-30 DIAGNOSIS — N179 Acute kidney failure, unspecified: Secondary | ICD-10-CM | POA: Diagnosis present

## 2021-09-30 DIAGNOSIS — E785 Hyperlipidemia, unspecified: Secondary | ICD-10-CM | POA: Diagnosis present

## 2021-09-30 DIAGNOSIS — D630 Anemia in neoplastic disease: Secondary | ICD-10-CM | POA: Diagnosis present

## 2021-09-30 DIAGNOSIS — Z20822 Contact with and (suspected) exposure to covid-19: Secondary | ICD-10-CM | POA: Diagnosis present

## 2021-09-30 DIAGNOSIS — C786 Secondary malignant neoplasm of retroperitoneum and peritoneum: Secondary | ICD-10-CM | POA: Diagnosis present

## 2021-09-30 DIAGNOSIS — E440 Moderate protein-calorie malnutrition: Secondary | ICD-10-CM | POA: Diagnosis present

## 2021-09-30 DIAGNOSIS — R109 Unspecified abdominal pain: Principal | ICD-10-CM | POA: Diagnosis present

## 2021-09-30 DIAGNOSIS — G893 Neoplasm related pain (acute) (chronic): Secondary | ICD-10-CM | POA: Diagnosis present

## 2021-09-30 DIAGNOSIS — K219 Gastro-esophageal reflux disease without esophagitis: Secondary | ICD-10-CM | POA: Diagnosis present

## 2021-09-30 DIAGNOSIS — F411 Generalized anxiety disorder: Secondary | ICD-10-CM | POA: Diagnosis present

## 2021-09-30 DIAGNOSIS — Z515 Encounter for palliative care: Secondary | ICD-10-CM

## 2021-09-30 DIAGNOSIS — K56609 Unspecified intestinal obstruction, unspecified as to partial versus complete obstruction: Secondary | ICD-10-CM | POA: Diagnosis present

## 2021-09-30 DIAGNOSIS — R739 Hyperglycemia, unspecified: Secondary | ICD-10-CM | POA: Diagnosis present

## 2021-09-30 DIAGNOSIS — C189 Malignant neoplasm of colon, unspecified: Secondary | ICD-10-CM | POA: Diagnosis present

## 2021-09-30 DIAGNOSIS — Z6831 Body mass index (BMI) 31.0-31.9, adult: Secondary | ICD-10-CM

## 2021-09-30 DIAGNOSIS — K565 Intestinal adhesions [bands], unspecified as to partial versus complete obstruction: Secondary | ICD-10-CM | POA: Diagnosis present

## 2021-09-30 DIAGNOSIS — E876 Hypokalemia: Secondary | ICD-10-CM | POA: Diagnosis present

## 2021-09-30 DIAGNOSIS — E669 Obesity, unspecified: Secondary | ICD-10-CM | POA: Diagnosis present

## 2021-09-30 DIAGNOSIS — Z8616 Personal history of COVID-19: Secondary | ICD-10-CM

## 2021-09-30 DIAGNOSIS — R112 Nausea with vomiting, unspecified: Secondary | ICD-10-CM | POA: Diagnosis present

## 2021-09-30 DIAGNOSIS — D701 Agranulocytosis secondary to cancer chemotherapy: Secondary | ICD-10-CM | POA: Diagnosis present

## 2021-09-30 DIAGNOSIS — Z888 Allergy status to other drugs, medicaments and biological substances status: Secondary | ICD-10-CM

## 2021-09-30 DIAGNOSIS — Z79891 Long term (current) use of opiate analgesic: Secondary | ICD-10-CM

## 2021-09-30 DIAGNOSIS — R0902 Hypoxemia: Secondary | ICD-10-CM | POA: Diagnosis present

## 2021-09-30 DIAGNOSIS — D72819 Decreased white blood cell count, unspecified: Secondary | ICD-10-CM | POA: Diagnosis present

## 2021-09-30 DIAGNOSIS — C180 Malignant neoplasm of cecum: Principal | ICD-10-CM | POA: Diagnosis present

## 2021-09-30 DIAGNOSIS — T451X5A Adverse effect of antineoplastic and immunosuppressive drugs, initial encounter: Secondary | ICD-10-CM | POA: Diagnosis present

## 2021-09-30 DIAGNOSIS — Z452 Encounter for adjustment and management of vascular access device: Secondary | ICD-10-CM

## 2021-09-30 DIAGNOSIS — Z931 Gastrostomy status: Secondary | ICD-10-CM

## 2021-09-30 DIAGNOSIS — Z79899 Other long term (current) drug therapy: Secondary | ICD-10-CM

## 2021-09-30 LAB — CBC WITH DIFFERENTIAL/PLATELET
Abs Immature Granulocytes: 0.01 10*3/uL (ref 0.00–0.07)
Basophils Absolute: 0 10*3/uL (ref 0.0–0.1)
Basophils Relative: 0 %
Eosinophils Absolute: 0 10*3/uL (ref 0.0–0.5)
Eosinophils Relative: 0 %
HCT: 41.2 % (ref 39.0–52.0)
Hemoglobin: 13.5 g/dL (ref 13.0–17.0)
Immature Granulocytes: 0 %
Lymphocytes Relative: 18 %
Lymphs Abs: 0.4 10*3/uL — ABNORMAL LOW (ref 0.7–4.0)
MCH: 28.4 pg (ref 26.0–34.0)
MCHC: 32.8 g/dL (ref 30.0–36.0)
MCV: 86.6 fL (ref 80.0–100.0)
Monocytes Absolute: 0.7 10*3/uL (ref 0.1–1.0)
Monocytes Relative: 30 %
Neutro Abs: 1.1 10*3/uL — ABNORMAL LOW (ref 1.7–7.7)
Neutrophils Relative %: 52 %
Platelets: 252 10*3/uL (ref 150–400)
RBC: 4.76 MIL/uL (ref 4.22–5.81)
RDW: 15 % (ref 11.5–15.5)
WBC: 2.2 10*3/uL — ABNORMAL LOW (ref 4.0–10.5)
nRBC: 0 % (ref 0.0–0.2)

## 2021-09-30 LAB — COMPREHENSIVE METABOLIC PANEL
ALT: 21 U/L (ref 0–44)
AST: 21 U/L (ref 15–41)
Albumin: 3 g/dL — ABNORMAL LOW (ref 3.5–5.0)
Alkaline Phosphatase: 120 U/L (ref 38–126)
Anion gap: 10 (ref 5–15)
BUN: 34 mg/dL — ABNORMAL HIGH (ref 6–20)
CO2: 31 mmol/L (ref 22–32)
Calcium: 8.5 mg/dL — ABNORMAL LOW (ref 8.9–10.3)
Chloride: 97 mmol/L — ABNORMAL LOW (ref 98–111)
Creatinine, Ser: 1.12 mg/dL (ref 0.61–1.24)
GFR, Estimated: >60 mL/min (ref >60)
Glucose, Bld: 113 mg/dL — ABNORMAL HIGH (ref 70–99)
Potassium: 3.2 mmol/L — ABNORMAL LOW (ref 3.5–5.1)
Sodium: 138 mmol/L (ref 135–145)
Total Bilirubin: 1.2 mg/dL (ref 0.3–1.2)
Total Protein: 7.5 g/dL (ref 6.5–8.1)

## 2021-09-30 LAB — PHOSPHORUS: Phosphorus: 3 mg/dL (ref 2.5–4.6)

## 2021-09-30 LAB — LIPASE, BLOOD: Lipase: 33 U/L (ref 11–51)

## 2021-09-30 LAB — MAGNESIUM: Magnesium: 2.2 mg/dL (ref 1.7–2.4)

## 2021-09-30 MED ORDER — METOPROLOL TARTRATE 5 MG/5ML IV SOLN
5.0000 mg | Freq: Once | INTRAVENOUS | Status: AC
  Administered 2021-09-30: 5 mg via INTRAVENOUS

## 2021-09-30 MED ORDER — PROCHLORPERAZINE EDISYLATE 10 MG/2ML IJ SOLN: 10.0000 mg | Freq: Four times a day (QID) | INTRAMUSCULAR | Status: DC | PRN

## 2021-09-30 MED ORDER — LACTATED RINGERS IV BOLUS
2000.0000 mL | Freq: Once | INTRAVENOUS | Status: AC
Start: 2021-09-30 — End: 2021-09-30
  Administered 2021-09-30: 2000 mL via INTRAVENOUS

## 2021-09-30 MED ORDER — POTASSIUM CHLORIDE IN NACL 40-0.9 MEQ/L-% IV SOLN
INTRAVENOUS | Status: AC
  Filled 2021-09-30 (×2): qty 1000

## 2021-09-30 MED ORDER — PANTOPRAZOLE SODIUM 40 MG IV SOLR
40.0000 mg | Freq: Two times a day (BID) | INTRAVENOUS | Status: DC
  Administered 2021-09-30 – 2021-10-12 (×25): 40 mg via INTRAVENOUS
  Filled 2021-09-30 (×24): qty 40

## 2021-09-30 MED ORDER — ONDANSETRON HCL 4 MG/2ML IJ SOLN
4.0000 mg | Freq: Four times a day (QID) | INTRAMUSCULAR | Status: DC | PRN
  Administered 2021-10-05 (×2): 4 mg via INTRAVENOUS
  Filled 2021-09-30 (×2): qty 2

## 2021-09-30 MED ORDER — DEXAMETHASONE SODIUM PHOSPHATE 4 MG/ML IJ SOLN
4.0000 mg | INTRAMUSCULAR | Status: DC
  Administered 2021-09-30 – 2021-10-06 (×7): 4 mg via INTRAVENOUS
  Filled 2021-09-30 (×7): qty 1

## 2021-09-30 MED ORDER — LORAZEPAM 2 MG/ML IJ SOLN
0.5000 mg | INTRAMUSCULAR | Status: DC | PRN
  Administered 2021-10-01 – 2021-10-14 (×20): 0.5 mg via INTRAVENOUS
  Filled 2021-09-30 (×20): qty 1

## 2021-09-30 MED ORDER — ONDANSETRON HCL 4 MG/2ML IJ SOLN
4.0000 mg | Freq: Four times a day (QID) | INTRAMUSCULAR | Status: DC | PRN
  Administered 2021-10-04 – 2021-10-12 (×4): 4 mg via INTRAVENOUS
  Filled 2021-09-30 (×4): qty 2

## 2021-09-30 MED ORDER — METOPROLOL TARTRATE 12.5 MG HALF TABLET
12.5000 mg | ORAL_TABLET | Freq: Two times a day (BID) | ORAL | Status: DC
  Administered 2021-09-30 – 2021-10-11 (×22): 12.5 mg via ORAL
  Filled 2021-09-30 (×24): qty 1

## 2021-09-30 MED ORDER — DIPHENHYDRAMINE HCL 12.5 MG/5ML PO ELIX: 12.5000 mg | ORAL_SOLUTION | Freq: Four times a day (QID) | ORAL | Status: DC | PRN

## 2021-09-30 MED ORDER — SODIUM CHLORIDE 0.9% FLUSH: 9.0000 mL | INTRAVENOUS | Status: DC | PRN

## 2021-09-30 MED ORDER — ONDANSETRON HCL 4 MG/2ML IJ SOLN
4.0000 mg | Freq: Once | INTRAMUSCULAR | Status: AC
  Administered 2021-09-30: 4 mg via INTRAVENOUS
  Filled 2021-09-30: qty 2

## 2021-09-30 MED ORDER — HYDROMORPHONE HCL 1 MG/ML IJ SOLN
1.0000 mg | INTRAMUSCULAR | Status: AC | PRN
  Administered 2021-09-30 (×4): 1 mg via INTRAVENOUS
  Filled 2021-09-30 (×4): qty 1

## 2021-09-30 MED ORDER — FENTANYL 75 MCG/HR TD PT72
1.0000 | MEDICATED_PATCH | TRANSDERMAL | Status: DC
  Administered 2021-09-30: 1 via TRANSDERMAL
  Filled 2021-09-30: qty 1

## 2021-09-30 MED ORDER — HYDROMORPHONE HCL 2 MG/ML IJ SOLN
2.0000 mg | Freq: Once | INTRAMUSCULAR | Status: AC
  Administered 2021-09-30: 2 mg via INTRAVENOUS
  Filled 2021-09-30: qty 1

## 2021-09-30 MED ORDER — DIPHENHYDRAMINE HCL 50 MG/ML IJ SOLN: 12.5000 mg | Freq: Four times a day (QID) | INTRAMUSCULAR | Status: DC | PRN

## 2021-09-30 MED ORDER — ACETAMINOPHEN 650 MG RE SUPP: 650.0000 mg | Freq: Four times a day (QID) | RECTAL | Status: DC | PRN

## 2021-09-30 MED ORDER — LACTATED RINGERS IV SOLN: INTRAVENOUS | Status: AC

## 2021-09-30 MED ORDER — NALOXONE HCL 0.4 MG/ML IJ SOLN: 0.4000 mg | INTRAMUSCULAR | Status: DC | PRN

## 2021-09-30 MED ORDER — PROCHLORPERAZINE EDISYLATE 10 MG/2ML IJ SOLN
10.0000 mg | INTRAMUSCULAR | Status: AC
Start: 2021-09-30 — End: 2021-09-30
  Administered 2021-09-30: 10 mg via INTRAVENOUS
  Filled 2021-09-30: qty 2

## 2021-09-30 MED ORDER — ONDANSETRON HCL 4 MG PO TABS: 4.0000 mg | ORAL_TABLET | Freq: Four times a day (QID) | ORAL | Status: DC | PRN

## 2021-09-30 MED ORDER — PANTOPRAZOLE SODIUM 40 MG IV SOLR
40.0000 mg | Freq: Every day | INTRAVENOUS | Status: DC
  Filled 2021-09-30: qty 40

## 2021-09-30 MED ORDER — HYDROMORPHONE 1 MG/ML IV SOLN
INTRAVENOUS | Status: DC
  Administered 2021-09-30: 30 mg via INTRAVENOUS
  Administered 2021-10-01: 1.8 mg via INTRAVENOUS
  Administered 2021-10-01: 1.66 mg via INTRAVENOUS
  Administered 2021-10-01: 6 mg via INTRAVENOUS
  Administered 2021-10-01: 5 mg via INTRAVENOUS
  Administered 2021-10-01: 7 mg via INTRAVENOUS
  Administered 2021-10-02: 4 mg via INTRAVENOUS
  Administered 2021-10-02: 5 mg via INTRAVENOUS
  Administered 2021-10-02: 1.18 mg via INTRAVENOUS
  Administered 2021-10-02: 0.991 mg via INTRAVENOUS
  Filled 2021-09-30: qty 30

## 2021-09-30 MED ORDER — MAGNESIUM SULFATE 2 GM/50ML IV SOLN
2.0000 g | Freq: Once | INTRAVENOUS | Status: AC
  Administered 2021-09-30: 2 g via INTRAVENOUS
  Filled 2021-09-30: qty 50

## 2021-09-30 MED ORDER — ACETAMINOPHEN 325 MG PO TABS
650.0000 mg | ORAL_TABLET | Freq: Four times a day (QID) | ORAL | Status: DC | PRN
  Administered 2021-10-08: 06:00:00 650 mg via ORAL
  Filled 2021-09-30: qty 2

## 2021-09-30 MED ORDER — METOPROLOL TARTRATE 5 MG/5ML IV SOLN
INTRAVENOUS | Status: AC
  Filled 2021-09-30: qty 5

## 2021-09-30 NOTE — Progress Notes (Signed)
Palliative consult received, we follow Ricardo [name] in the cancer center. I have reviewed his chart and placed orders for pain and symptom management medication until we have a provider who is available to see him in the hospital.  Fentanyl 139mcg TD Hydromorphone 1mg  q1 prn pain-may consider PCA if need for frequent prn dosing. Decadron 4mg  IV daily Ativan 0.5mg  IV for anxiety.   Lane Hacker, DO Palliative Medicine

## 2021-09-30 NOTE — ED Provider Notes (Signed)
[facility] DEPT Provider Note   CSN: [id number] Arrival date & time: 09/30/21  2376     History  Chief Complaint  Patient presents with   Abdominal Pain    Ricardo [name] is a 56 y.o. male.  56 year old male with history of colon cancer and chronic abdominal pain with recent mission for same presents with ongoing diffuse abdominal discomfort with nonbilious emesis.  Patient denies any fever.  His emesis has not been bilious or bloody.  Still passing flatus.  No abdominal distention.  Patient is on home Dilaudid as well as fentanyl for his chronic abdominal discomfort.  Also has a G-tube in place as well 2.  Seen at the cancer center yesterday and was evaluated for same and said medications adjustments made at that time.  Patient states they have not been helpful      Home Medications Prior to Admission medications   Medication Sig Start Date End Date Taking? Authorizing Provider  ALPRAZolam (XANAX) 0.25 MG tablet Take 1 tablet (0.25 mg total) by mouth 3 (three) times daily as needed for anxiety or sleep. 09/29/21   Pickenpack-Cousar, [name], NP  dexamethasone (DECADRON) 4 MG tablet Take 1 tablet (4 mg total) by mouth daily. 09/23/21 10/23/21  Mariel Aloe, MD  fentaNYL (DURAGESIC) 100 MCG/HR Place 1 patch onto the skin every 3 (three) days. 09/23/21 10/23/21  Pickenpack-Cousar, [name], NP  fentaNYL (DURAGESIC) 25 MCG/HR Place 1 patch onto the skin every 3 (three) days. 09/29/21   Pickenpack-Cousar, [name], NP  fentaNYL (DURAGESIC) 50 MCG/HR Place 1 patch onto the skin every 3 (three) days. 09/23/21   Pickenpack-Cousar, [name], NP  HYDROmorphone (DILAUDID) 2 MG tablet Take 3 tablets (6mg ) every 3-4 hours as needed for breakthrough pain. 09/23/21   Pickenpack-Cousar, [name], NP  naloxone [facility]) nasal spray 4 mg/0.1 mL Use 1 spray for opioid overdose 09/12/21   Debbe Odea, MD  OVER THE COUNTER MEDICATION Take 1 capsule by mouth daily. Immunocal     [provider]  pantoprazole (PROTONIX) 40 MG tablet Take 1 tablet (40 mg total) by mouth 2 (two) times daily. 09/29/21 10/29/21  Pickenpack-Cousar, [name], NP  polyethylene glycol (MIRALAX / GLYCOLAX) 17 g packet Take 17 g by mouth daily as needed for mild constipation. Patient not taking: Reported on 09/03/2021    [provider]  prochlorperazine (COMPAZINE) 10 MG tablet Take 1 tablet (10 mg total) by mouth every 6 (six) hours as needed for nausea or vomiting. 09/26/21   [name], MD      Allergies    Oxaliplatin    Review of Systems   Review of Systems  All other systems reviewed and are negative.  Physical Exam Updated Vital Signs BP 123/79    Pulse (!) 125    Temp 99.1 F (37.3 C) (Oral)    Resp 18    SpO2 98%  Physical Exam Vitals and nursing note reviewed.  Constitutional:      General: He is not in acute distress.    Appearance: Normal appearance. He is well-developed. He is not toxic-appearing.  HENT:     Head: Normocephalic and atraumatic.  Eyes:     General: Lids are normal.     Conjunctiva/sclera: Conjunctivae normal.     Pupils: Pupils are equal, round, and reactive to light.  Neck:     Thyroid: No thyroid mass.     Trachea: No tracheal deviation.  Cardiovascular:     Rate  and Rhythm: Regular rhythm. Bradycardia present.     Heart sounds: Normal heart sounds. No murmur heard.   No gallop.  Pulmonary:     Effort: Pulmonary effort is normal. No respiratory distress.     Breath sounds: Normal breath sounds. No stridor. No decreased breath sounds, wheezing, rhonchi or rales.  Abdominal:     General: There is no distension.     Palpations: Abdomen is soft.     Tenderness: There is generalized abdominal tenderness. There is no rebound.  Musculoskeletal:        General: No tenderness. Normal range of motion.     Cervical back: Normal range of motion and neck supple.  Skin:    General: Skin is warm and dry.     Findings: No abrasion or rash.   Neurological:     Mental Status: He is alert and oriented to person, place, and time. Mental status is at baseline.     GCS: GCS eye subscore is 4. GCS verbal subscore is 5. GCS motor subscore is 6.     Cranial Nerves: No cranial nerve deficit.     Sensory: No sensory deficit.     Motor: Motor function is intact.  Psychiatric:        Attention and Perception: Attention normal.        Speech: Speech normal.        Behavior: Behavior normal.    ED Results / Procedures / Treatments   Labs (all labs ordered are listed, but only abnormal results are displayed) Labs Reviewed  CBC WITH DIFFERENTIAL/PLATELET  COMPREHENSIVE METABOLIC PANEL  LIPASE, BLOOD    EKG None  Radiology No results found.  Procedures Procedures    Medications Ordered in ED Medications  HYDROmorphone (DILAUDID) injection 2 mg (has no administration in time range)  lactated ringers bolus 2,000 mL (has no administration in time range)  lactated ringers infusion (has no administration in time range)  prochlorperazine (COMPAZINE) injection 10 mg (has no administration in time range)    ED Course/ Medical Decision Making/ A&P                           Medical Decision Making Amount and/or Complexity of Data Reviewed Labs: ordered. Radiology: ordered.  Risk Prescription drug management.   Patient given IV pain control with Dilaudid and nausea medication with Compazine.  Remains uncomfortable at this time.  IV fluids also administered as well 2.  Patient had acute abdominal series was for interpretation shows chronic small bowel obstruction.  Patient being treated with NG tube for this.  Patient with recent mission for same and will unfortunately require another admission for treatment of his pain.  Will discuss with hospitalist for admission        Final Clinical Impression(s) / ED Diagnoses Final diagnoses:  None    Rx / DC Orders ED Discharge Orders     None         Lacretia Leigh,  MD 09/30/21 1029

## 2021-09-30 NOTE — H&P (Signed)
History and Physical    Ricardo [name] ERX:[id number] DOB: [birth year] DOA: 09/30/2021  PCP: [name], MD   Patient coming from: Home.   I have personally briefly reviewed patient's old medical records in [place]  Chief Complaint: Abdominal pain, nausea and vomiting.  HPI: Ricardo [name] is a 56 y.o. male with medical history significant of osteoarthritis, class II obesity, generalized anxiety disorder, hyperlipidemia, unspecified sleep disorder, GERD, colon cancer with metastasis and peritoneal carcinomatosis who was recently admitted and discharged yesterday due to SBO requiring PEG placement for venting purposes who again returns to the hospital due to recurrence of abdominal pain, multiple episodes of nausea, emesis, despite draining the PEG tube collecting bag 3-4 times yesterday similar to while he experience when I admitted him on the first of the month.  ED Course: Initial vital signs were temperature 99.1 F, pulse 133, respiration 20, BP 138/91 mmHg O2 sat 100% on room air.  The patient received 2000 mL of LR bolus hydromorphone 2 mg IVP x1 and Compazine 10 mg IVP x1.  Lab work: CBC is her white count 2.2, hemoglobin 13.5 g/dL platelets 252.  CMP with potassium of 3.2 and chloride 97 mmol/L.  LFTs were normal SF4 and albumin of 3.0 g/dL.  Glucose 113, BUN 34 and creatinine 1.12 mg/dL.  Review of Systems: As per HPI otherwise all other systems reviewed and are negative.  Past Medical History:  Diagnosis Date   Arthritis    Class 2 obesity 09/14/2021   Colon cancer [facility])    with metastasis   Family history of adverse reaction to anesthesia    mother had problem with it due to her asthma   Family history of breast cancer    Family history of prostate cancer    GAD (generalized anxiety disorder) 09/03/2021   GERD (gastroesophageal reflux disease) 09/03/2021   History of COVID-19 04/30/2021   Formatting of this note might be different from the original.  01/2021, asymptomatic   Hyperlipidemia 11/27/2020   Pleural effusion, left 09/06/2021   Sleep disorder 11/10/2018   Past Surgical History:  Procedure Laterality Date   BOWEL RESECTION N/A 11/29/2020   Procedure: PARTIAL BOWEL RESECTION;  Surgeon: Jovita Kussmaul, MD;  Location: [facility] OR;  Service: General;  Laterality: N/A;   GASTROSTOMY Left 11/29/2020   Procedure: INSERTION OF GASTROSTOMY TUBE;  Surgeon: Jovita Kussmaul, MD;  Location: [place];  Service: General;  Laterality: Left;   IR GASTROSTOMY TUBE MOD SED  09/11/2021   IR IMAGING GUIDED PORT INSERTION  12/12/2020   LAPAROSCOPIC APPENDECTOMY N/A 01/17/2019   Procedure: APPENDECTOMY LAPAROSCOPIC;  Surgeon: Ralene Ok, MD;  Location: [place];  Service: General;  Laterality: N/A;   LAPAROTOMY N/A 11/29/2020   Procedure: EXPLORATORY LAPAROTOMY;  Surgeon: Jovita Kussmaul, MD;  Location: [place];  Service: General;  Laterality: N/A;  PUT CASE IN ROOM 1 STARTING AT 9:30AM FOR 120 MIN   OSTOMY N/A 11/29/2020   Procedure: POSSIBLE OSTOMY CREATION;  Surgeon: Jovita Kussmaul, MD;  Location: [place];  Service: General;  Laterality: N/A;   RECTAL BIOPSY N/A 11/29/2020   Procedure: PERITIONEAL BIOPSY;  Surgeon: Jovita Kussmaul, MD;  Location: [place];  Service: General;  Laterality: N/A;   SHOULDER SURGERY Left 09/19/2015   Social History  reports that he has never smoked. He has never used smokeless tobacco. He reports that he does not currently use alcohol. He reports that he does not use drugs.  Allergies  Allergen Reactions   Oxaliplatin Hives and Other (See Comments)    15 minutes into infusion, patient started to complain of feeling warm and states " it feels like the last time I had my reaction"   Family History  Problem Relation Age of Onset   Cancer Sister 5       breast cancer   Cancer Brother 58       prostate cancer    High Cholesterol Brother    Pancreatic cancer Maternal Uncle    Bone cancer Cousin        pat first cousin   Colon cancer Neg  Hx    Liver disease Neg Hx    Esophageal cancer Neg Hx    Stomach cancer Neg Hx    Prior to Admission medications   Medication Sig Start Date End Date Taking? Authorizing Provider  ALPRAZolam (XANAX) 0.25 MG tablet Take 1 tablet (0.25 mg total) by mouth 3 (three) times daily as needed for anxiety or sleep. 09/29/21   Pickenpack-Cousar, [name], NP  dexamethasone (DECADRON) 4 MG tablet Take 1 tablet (4 mg total) by mouth daily. 09/23/21 10/23/21  Mariel Aloe, MD  fentaNYL (DURAGESIC) 100 MCG/HR Place 1 patch onto the skin every 3 (three) days. 09/23/21 10/23/21  Pickenpack-Cousar, [name], NP  fentaNYL (DURAGESIC) 25 MCG/HR Place 1 patch onto the skin every 3 (three) days. 09/29/21   Pickenpack-Cousar, [name], NP  fentaNYL (DURAGESIC) 50 MCG/HR Place 1 patch onto the skin every 3 (three) days. 09/23/21   Pickenpack-Cousar, [name], NP  HYDROmorphone (DILAUDID) 2 MG tablet Take 3 tablets (6mg ) every 3-4 hours as needed for breakthrough pain. 09/23/21   Pickenpack-Cousar, [name], NP  naloxone [facility]) nasal spray 4 mg/0.1 mL Use 1 spray for opioid overdose 09/12/21   Debbe Odea, MD  OVER THE COUNTER MEDICATION Take 1 capsule by mouth daily. Immunocal    [provider]  pantoprazole (PROTONIX) 40 MG tablet Take 1 tablet (40 mg total) by mouth 2 (two) times daily. 09/29/21 10/29/21  Pickenpack-Cousar, [name], NP  polyethylene glycol (MIRALAX / GLYCOLAX) 17 g packet Take 17 g by mouth daily as needed for mild constipation. Patient not taking: Reported on 09/03/2021    [provider]  prochlorperazine (COMPAZINE) 10 MG tablet Take 1 tablet (10 mg total) by mouth every 6 (six) hours as needed for nausea or vomiting. 09/26/21   [name], MD    Physical Exam: Vitals:   09/30/21 1000 09/30/21 1015 09/30/21 1030 09/30/21 1100  BP: 110/87 116/84 113/82 108/83  Pulse: (!) 118 (!) 121 (!) 118 (!) 118  Resp:  16 18   Temp:      TempSrc:      SpO2: 97% 94% 95% 97%     Constitutional: Chronically ill-appearing.  NAD, calm, comfortable Eyes: PERRL, lids and conjunctivae normal ENMT: Mucous membranes are mildly dry.  Posterior pharynx clear of any exudate or lesions. Neck: normal, supple, no masses, no thyromegaly Respiratory: Decreased breath sounds in bases., no wheezing, no crackles. Normal respiratory effort. No accessory muscle use.  Cardiovascular: Sinus tachycardia in the 110s, no murmurs / rubs / gallops. No extremity edema. 2+ pedal pulses. No carotid bruits.  Abdomen: Positive PEG tube, mild diffuse tenderness, no masses palpated. No hepatosplenomegaly. Bowel sounds positive.  Musculoskeletal: no clubbing / cyanosis. No joint deformity upper and lower extremities. Good ROM, no contractures. Normal muscle tone.  Skin: no rashes, lesions, ulcers. No induration Neurologic: CN 2-12 grossly intact.  Sensation intact, DTR normal. Strength 5/5 in all 4.  Psychiatric: Normal judgment and insight. Alert and oriented x 3. Normal mood.   Labs on Admission: I have personally reviewed following labs and imaging studies  CBC: Recent Labs  Lab 09/30/21 0830  WBC 2.2*  NEUTROABS 1.1*  HGB 13.5  HCT 41.2  MCV 86.6  PLT 540    Basic Metabolic Panel: Recent Labs  Lab 09/24/21 0606 09/30/21 0830  NA 130* 138  K 4.0 3.2*  CL 98 97*  CO2 27 31  GLUCOSE 107* 113*  BUN 21* 34*  CREATININE 0.78 1.12  CALCIUM 7.9* 8.5*  MG 2.0  --   PHOS 3.5  --     GFR: CrCl cannot be calculated (Unknown ideal weight.).  Liver Function Tests: Recent Labs  Lab 09/30/21 0830  AST 21  ALT 21  ALKPHOS 120  BILITOT 1.2  PROT 7.5  ALBUMIN 3.0*   Radiological Exams on Admission: DG ABD ACUTE 2+V W 1V CHEST  Result Date: 09/30/2021 CLINICAL DATA:  Abdominal pain. EXAM: DG ABDOMEN ACUTE WITH 1 VIEW CHEST COMPARISON:  Abdominal radiographs 09/14/2021 CT abdomen and pelvis 09/02/2021 FINDINGS: Large body habitus. Right chest wall porta catheter with tip  overlying the superior vena cava/right atrial junction. Cardiac silhouette and mediastinal contours are within normal limits. Mild calcification within aortic arch. The lungs are clear. No pleural effusion or pneumothorax. A gastrostomy tube again overlies the left upper quadrant. There are air-fluid levels within multiple loops of bowel on upright view. There may be mild distention of small-bowel loops measuring up to approximately 4 cm in caliber No portal venous gas or pneumatosis is seen. No subdiaphragmatic free air on upright view. IMPRESSION: There are air-fluid levels throughout the small bowel. There is likely dilatation of some small bowel loops at least 4 cm. Note is made of small bowel obstruction with similar small bowel dilatation due to adhesions to the anterior abdominal wall seen on prior CT. Gastrostomy tube is again noted. Electronically Signed   By: Yvonne Kendall M.D.   On: 09/30/2021 09:29   [place] EKG SITE RITE  Result Date: 09/30/2021 If Site Rite image not attached, placement could not be confirmed due to current cardiac rhythm.   EKG: Independently reviewed.   Assessment/Plan Principal Problem:   Intractable abdominal pain With    Nausea and vomiting Observation/MedSurg. Continue IV fluids and electrolyte replacement. Analgesics as needed while in the ER. Begin hydromorphone PCA once on the floor. Antiemetics as needed. Pantoprazole  Active Problems:   Hypokalemia Correcting. Magnesium was supplemented.    Sinus tachycardia Continue IV fluids.   Continue electrolyte replacement. Begin metoprolol 12.5 mg p.o. twice daily. Check echocardiogram.    GERD (gastroesophageal reflux disease) Pantoprazole has been increased to 40 mg IVP BID.     GAD (generalized anxiety disorder) Continue alprazolam as needed. Parenteral lorazepam as needed if unable to PO.     Colon cancer metastasized to multiple sites [facility]) Poor overall prognosis. Schedul follow-up at the cancer  center with Dr. Burr Medico.     Class 2 obesity On TPN. Originally scheduled to have PICC line today. PICC line to be placed while he is in inpatient.     Moderate protein malnutrition ([place]) Continue TPN per pharmacy.    Normocytic anemia Monitor hematocrit and hemoglobin.    DVT prophylaxis:      SCDs. Code Status:              Full code. Family Communication:  His spouse was at  bedside. Disposition Plan:              Patient is from:                        Home.             Anticipated DC to:                   Home.             Anticipated DC date:               09/15/2021 for 09/16/2021.             Anticipated DC barriers:         Clinical status. Consults called:         Admission status:     Observation/MedSurg.    Severity of Illness: High severity due to recurring abdominal pain with nausea and vomiting.  The patient will be observed for 24 to 48 hours to continue symptoms treatment.  Reubin Milan MD [facility]  How to contact the [facility] Attending or Consulting provider [place] or covering provider during after hours [place], for this patient?   Check the care team in [facility] and look for a) attending/consulting [facility] provider listed and b) the [facility] team listed Log into [email or web address] and use [place]'s universal password to access. If you do not have the password, please contact the hospital operator. Locate the [facility] provider you are looking for under [facility] and page to a number that you can be directly reached. If you still have difficulty reaching the provider, please page the [facility] (Director on Call) for the Hospitalists listed on amion for assistance.  09/30/2021, 11:15 AM   This document was prepared using [employer] and may contain some unintended transcription errors.

## 2021-09-30 NOTE — Telephone Encounter (Signed)
Transition Care Management Unsuccessful Follow-up Telephone Call  Date of discharge and from where:  [facility]   Attempts:  2nd Attempt  Reason for unsuccessful TCM follow-up call:  Unable to reach patient   Notes say patient currently admitted to Er

## 2021-09-30 NOTE — ED Notes (Signed)
Pt has port accessed via home health caregiver, 09/26/21.

## 2021-09-30 NOTE — ED Triage Notes (Addendum)
Pt BIB EMS from home. Pt reports abdominal pain x5 days. Pt has hx of colon cancer and is supposed to start chemo tomorrow. Pt reports he has 1 38mcg and 1 176mcg fentanyl patch on.   20G LAC  BP 110/80 HR 130 RR 16

## 2021-09-30 NOTE — Progress Notes (Addendum)
HEMATOLOGY-ONCOLOGY PROGRESS NOTE  ASSESSMENT AND PLAN: 1.  Abdominal pain secondary to small bowel obstruction and peritoneal metastasis 2.  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  Nausea and vomiting 4.  Hypokalemia 5.  GERD 6.  Protein calorie malnutrition 7.  Mild neutropenia secondary to recent chemotherapy 8.  Goals of care discussion  -The patient has abdominal pain secondary to his metastatic cancer and bowel obstruction.  Pain currently well controlled on Dilaudid.  He will be started on Dilaudid PCA.  Will request palliative care consult as they have been following him in the outpatient setting for assistance with pain management. -He has antiemetics as needed for nausea and vomiting. -The patient was due for a second cycle of chemotherapy in our office tomorrow.  We will consider giving a second cycle in the hospital versus rescheduling to the outpatient setting next week. -Replete potassium per hospitalist -Continue PPI -He is on home TPN.  Recommend for him to continue TPN here in the hospital. -He has mild neutropenia due to recent chemotherapy.  His ANC is currently 1.1.  He is afebrile.  We will monitor for now. -Goals of treatment are for palliation of symptoms.  He understands that his disease is not curable.  He agrees to limited code to include CPR and ACLS medications, but no defibrillation or cardioversion or mechanical ventilation.  Mikey Bussing, DNP, AGPCNP-BC, AOCNP  SUBJECTIVE: Mr. Mcdermid is under our care for metastatic colon cancer.  He has had multiple hospitalizations over the past 2 months for abdominal pain and small bowel obstruction.  Recent scans show concern for disease progression.  He was restarted on FOLFIRINOX during his last hospital admission with improvement of his abdominal pain and bowel obstruction.  He was due to begin his second cycle of chemotherapy in our office tomorrow.  He now presents to the emergency department with  increased abdominal pain.  He is also having some vomiting when his venting gastrostomy tube is clamped.  He reports improvement in his abdominal pain after receiving IV Dilaudid.  Currently rates pain 3/10.  He denies nausea and vomiting.  Reports that he has been having loose stools at home.  Oncology History Overview Note  Cancer Staging metastatic cecal cancer Staging form: Colon and Rectum, AJCC 8th Edition - Clinical stage from 11/29/2020: Stage IVC (cTX, cN2, pM1c) - Signed by [name], MD on 12/04/2020 Stage prefix: Initial diagnosis Histologic grade (G): G3 Histologic grading system: 4 grade system    metastatic cecal cancer  11/27/2020 Imaging   CT Angio CAP  IMPRESSION: 1. Thickening of the terminal ileum with associated proximal mid to distal small bowel obstruction. Findings could be due to an ileitis versus malignancy. Nonspecific mesenteric edema could be due to engorgement versus metastases. No associated bowel perforation. Recommend endoscopy for further evaluation. 2. Indeterminate right lower quadrant lymphadenopathy. 3. Scattered colonic diverticulosis with no acute diverticulitis. 4. Stable right hepatic lobe subcentimeter hyperdensity likely represents a hepatic hemangioma. 5. No acute vascular abnormality. Aortic Atherosclerosis (ICD10-I70.0) - mild. 6. No acute intrathoracic abnormality.   11/28/2020 Imaging   CT AP  IMPRESSION: 1. There is masslike, circumferential thickening of the terminal ileum and cecal base near the ileocecal valve and abnormally enlarged lymph nodes in the right lower quadrant mesentery adjacent to the terminal ileum measuring up to 2.3 x 1.4 cm. 2. There is extensive omental and peritoneal nodularity and caking throughout the abdomen. 3. Findings are highly concerning for primary colon malignancy with nodal and peritoneal  metastatic disease. 4. Small volume perihepatic and perisplenic ascites. 5. The small bowel is generally  decompressed, with full some fluid-filled, nondistended loops throughout. There is transit of oral enteric contrast to the terminal ileum. No evidence of overt bowel obstruction at this time. Esophagogastric tube is position with tip and side port below the diaphragm. 6. Atelectasis or consolidation of the dependent bilateral lung bases, new compared to prior examination.   Aortic Atherosclerosis (ICD10-I70.0).     11/29/2020 Surgery   EXPLORATORY LAPAROTOMY, PARTIAL BOWEL RESECTION, POSSIBLE OSTOMY CREATION, PERITIONEAL BIOPSY, INSERTION OF GASTROSTOMY TUBE by Dr [name]   11/29/2020 Initial Biopsy   FINAL MICROSCOPIC DIAGNOSIS:   AB. 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    COMMENT:   Immunohistochemistry (IHC) for CK20 and CDX-2 is strong and diffusely  positive.  CK7, TTF-1, Synaptophysin, Chromogranin and CD56 are  negative.  The immunophenotype is compatible with origin from the lower  gastrointestinal tract.  IHC for MMR will be reported separately.  Case  preliminarily discussed with Dr. [name] on 12/02/2020.   At the request of Dr. [name], [phone] was reviewed in retrospect.  Review of the submitted sections confirms the presence of acute  appendicitis.  No malignancy is identified.    11/29/2020 Cancer Staging   Staging form: Colon and Rectum, AJCC 8th Edition - Clinical stage from 11/29/2020: Stage IVC (cTX, cN2, pM1c) - Signed by [name], MD on 12/04/2020 Stage prefix: Initial diagnosis Histologic grade (G): G3 Histologic grading system: 4 grade system    12/04/2020 Initial Diagnosis   Cancer of ascending colon metastatic to intra-abdominal lymph node ([place])    Chemotherapy   FOLFOX q2weeks    12/18/2020 - 02/15/2021 Chemotherapy      Patient is on Antibody Plan: COLORECTAL BEVACIZUMAB Q14D     01/03/2021 Imaging   CT A/P IMPRESSION: 1. Wall thickening of the  terminal ileum again seen. Fluid-filled distal small bowel, ascending, and transverse colon without obstruction. 2. Equivocal improvement in omental and peritoneal metastatic disease with slightly improved small volume perihepatic and perisplenic ascites. Right lower quadrant mesenteric adenopathy is similar or mildly improved. 3. Sigmoid colonic diverticulosis. Mild mural wall thickening in the sigmoid, no acute diverticulitis. 4. Gastrostomy tube in place, balloon normally positioned in the stomach. 5. Chronic bilateral L5 pars interarticularis defects with trace anterolisthesis of L5 on S1. Chronic avascular necrosis of the left femoral head.   01/16/2021 Genetic Testing   Negative genetic testing. CTNNA1 [id number] VUS, RAD51D p.G140E VUS and TSC1 p.R768H VUS found on the CancerNext-Expanded+RNAinsight.  The CancerNext-Expanded gene panel offered by [facility] and includes sequencing and rearrangement analysis for the following 77 genes: AIP, ALK, APC*, ATM*, AXIN2, BAP1, BARD1, BLM, BMPR1A, BRCA1*, BRCA2*, BRIP1*, CDC73, CDH1*, CDK4, CDKN1B, CDKN2A, CHEK2*, CTNNA1, DICER1, FANCC, FH, FLCN, GALNT12, KIF1B, LZTR1, MAX, MEN1, MET, MLH1*, MSH2*, MSH3, MSH6*, MUTYH*, NBN, NF1*, NF2, NTHL1, PALB2*, PHOX2B, PMS2*, POT1, PRKAR1A, PTCH1, PTEN*, RAD51C*, RAD51D*, RB1, RECQL, RET, SDHA, SDHAF2, SDHB, SDHC, SDHD, SMAD4, SMARCA4, SMARCB1, SMARCE1, STK11, SUFU, TMEM127, TP53*, TSC1, TSC2, VHL and XRCC2 (sequencing and deletion/duplication); EGFR, EGLN1, HOXB13, KIT, MITF, PDGFRA, POLD1, and POLE (sequencing only); EPCAM and GREM1 (deletion/duplication only). DNA and RNA analyses performed for * genes. The report date is Jan 16, 2021.   02/24/2021 Imaging   CT AP  IMPRESSION: 1. Interval resolution of previously seen small volume ascites throughout the abdomen and pelvis. There is redemonstrated, ill-defined stranding and thickening of  the peritoneal surfaces and omentum, which is somewhat  diminished compared to prior examination. 2. Interval improvement in right lower quadrant mesocolon lymph nodes. 3. Findings are consistent with treatment response of nodal and peritoneal metastatic disease. 4. Unchanged, matted appearing thickening of the terminal ileum and cecal base. 5. Sigmoid diverticulosis with wall thickening of the mid to distal sigmoid, similar to prior examination. Findings are most suggestive of chronic sequelae of diverticulitis.   Aortic Atherosclerosis (ICD10-I70.0).   03/13/2021 -  Chemotherapy   Patient is on Treatment Plan : COLORECTAL FOLFOXIRI + Bevacizumab q14d     05/13/2021 Procedure   OPERATIVE PROCEDURE: Laparoscopy and peritoneal biopsy.  SURGEON: Merlyn Albert. Clovis Riley, MD   05/13/2021 Pathology Results   Final Pathologic Diagnosis    A. PERITONEAL, BIOPSY :              Metastatic adenocarcinoma.               See comment.    Comment    The biopsies show predominately fibrosis  and fibroadipose tissue with a small focus of moderately differentiated adenocarcinoma. Given the patient's history, the findings favor metastasis of the patient's known colonic cancer.     07/01/2021 Imaging   CT CAP  IMPRESSION: Chest Impression:   1. No evidence of thoracic metastasis.   Abdomen / Pelvis Impression:   1. Thickening through the terminal ileum similar to prior. No ileocecal lymphadenopathy. 2. One focus linear thickening the peritoneum adjacent to loops of small bowel in the ventral abdomen which extend to the proximal sigmoid colon are more prominent than prior and concerning for residual peritoneal carcinoma. 3. No evidence of solid organ metastasis in the abdomen pelvis.      REVIEW OF SYSTEMS:   Review of Systems  Constitutional:  Negative for chills and fever.  HENT: Negative.    Eyes: Negative.   Respiratory: Negative.    Cardiovascular:  Positive for chest pain.  Gastrointestinal:  Positive for abdominal pain, nausea and  vomiting.  Skin: Negative.   Neurological: Negative.   Endo/Heme/Allergies: Negative.   Psychiatric/Behavioral: Negative.     I have reviewed the past medical history, past surgical history, social history and family history with the patient and they are unchanged from previous note.   PHYSICAL EXAMINATION: ECOG PERFORMANCE STATUS: 2 - Symptomatic, <50% confined to bed  Vitals:   09/30/21 1215 09/30/21 1300  BP: 110/77 111/88  Pulse: (!) 112 (!) 114  Resp: 16 13  Temp:    SpO2: 93% 94%   There were no vitals filed for this visit.  Intake/Output from previous day: No intake/output data recorded.  Physical Exam Vitals reviewed.  Constitutional:      General: He is not in acute distress. HENT:     Head: Normocephalic.     Mouth/Throat:     Mouth: Mucous membranes are moist.     Pharynx: No oropharyngeal exudate.  Eyes:     General: No scleral icterus. Cardiovascular:     Rate and Rhythm: Tachycardia present.     Pulses: Normal pulses.  Pulmonary:     Effort: Pulmonary effort is normal.     Breath sounds: Normal breath sounds.  Abdominal:     Comments: Mildly distended. No pain with palpation.   Skin:    General: Skin is warm and dry.  Neurological:     Mental Status: He is alert and oriented to person, place, and time.  Psychiatric:        Mood  and Affect: Mood normal.        Behavior: Behavior normal.        Thought Content: Thought content normal.        Judgment: Judgment normal.    LABORATORY DATA:  I have reviewed the data as listed CMP Latest Ref Rng & Units 09/30/2021 09/24/2021 09/22/2021  Glucose 70 - 99 mg/dL 113(H) 107(H) 110(H)  BUN 6 - 20 mg/dL 34(H) 21(H) 22(H)  Creatinine 0.61 - 1.24 mg/dL 1.12 0.78 0.73  Sodium 135 - 145 mmol/L 138 130(L) 135  Potassium 3.5 - 5.1 mmol/L 3.2(L) 4.0 4.1  Chloride 98 - 111 mmol/L 97(L) 98 100  CO2 22 - 32 mmol/L '31 27 28  ' Calcium 8.9 - 10.3 mg/dL 8.5(L) 7.9(L) 8.0(L)  Total Protein 6.5 - 8.1 g/dL 7.5 - 6.6   Total Bilirubin 0.3 - 1.2 mg/dL 1.2 - 0.4  Alkaline Phos 38 - 126 U/L 120 - 89  AST 15 - 41 U/L 21 - 23  ALT 0 - 44 U/L 21 - 33    Lab Results  Component Value Date   WBC 2.2 (L) 09/30/2021   HGB 13.5 09/30/2021   HCT 41.2 09/30/2021   MCV 86.6 09/30/2021   PLT 252 09/30/2021   NEUTROABS 1.1 (L) 09/30/2021    Lab Results  Component Value Date   CEA1 1.9 11/29/2020   XIH038 4 11/30/2020    X-ray chest PA and lateral  Result Date: 09/14/2021 CLINICAL DATA:  56 year old male with a history of colon cancer, recurrent SBO s/p venting gastrostomy tube, who presents to the emergency department today for evaluation of abdominal pain. Of note, patient recently admitted to the hospital for recurrent SBO and had a gastrostomy tube placed. He was actually discharged from the hospital yesterday. Since then he states he has been draining his PEG tube and he continues to have a significant amount of output so he has not drained in several hours. He is now complaining of severe abdominal pain, distention, nausea vomiting. EXAM: CHEST - 2 VIEW COMPARISON:  01/04/2021. FINDINGS: Increased opacity at the left lung base compared to the prior exam, consistent with atelectasis, pneumonia or a combination. Remainder of the lungs is clear. Small left pleural effusion.  No pneumothorax. Cardiac silhouette normal in size. No mediastinal or hilar masses or evidence of adenopathy. Stable right internal jugular Port-A-Cath. Skeletal structures are grossly intact. IMPRESSION: 1. Left lower lung opacity consistent with a combination of atelectasis/pneumonia and a small effusion, new compared to the prior chest radiograph. Electronically Signed   By: Lajean Manes M.D.   On: 09/14/2021 10:59   Abd 1 View (KUB)  Result Date: 09/14/2021 CLINICAL DATA:  History of colon cancer with recurrent small bowel obstruction. Patient presents with abdominal pain. EXAM: ABDOMEN - 1 VIEW COMPARISON:  09/08/21 FINDINGS: Left upper  quadrant gastrostomy tube identified. Since the previous exam there is been interval improvement in gaseous distension the of the small and large bowel loops. No new findings. IMPRESSION: Interval improvement in gaseous distension of the small and large bowel loops compared with 09/08/2021. Electronically Signed   By: Kerby Moors M.D.   On: 09/14/2021 10:57   DG Abd 1 View  Result Date: 09/08/2021 CLINICAL DATA:  Abdominal pain, nausea, vomiting EXAM: ABDOMEN - 1 VIEW COMPARISON:  09/07/2021 FINDINGS: Prominent small bowel loops centrally again noted, similar prior study compatible with persistent small bowel obstruction. No free air or organomegaly. Visualized lung bases clear. IMPRESSION: Continued small bowel obstruction pattern,  not significantly changed. Electronically Signed   By: Rolm Baptise M.D.   On: 09/08/2021 15:12   DG Abd 1 View  Result Date: 09/07/2021 CLINICAL DATA:  Nausea and vomiting; EXAM: ABDOMEN - 1 VIEW COMPARISON:  September 04, 2021 ;September 03, 2021; September 02, 2021 FINDINGS: There are a few mildly dilated loops of bowel in the upper abdomen, improved in comparison to prior from September 03, 2021. Interval removal of enteric tube. Partial visualization of CVC tip terminating over the region of the superior cavoatrial junction. No bowel gas visualized in the rectum. IMPRESSION: Persistent mild dilation of several loops of small bowel in the upper abdomen, overall improved since September 03, 2021. This could reflect persistent small-bowel obstruction. Electronically Signed   By: Valentino Saxon M.D.   On: 09/07/2021 14:00   DG Abdomen 1 View  Result Date: 09/03/2021 CLINICAL DATA:  Nasogastric tube placement EXAM: ABDOMEN - 1 VIEW COMPARISON:  None. FINDINGS: Nasogastric tube tip overlies the expected mid body of the stomach. Multiple gas-filled dilated loops of small bowel are seen within the mid abdomen and left upper quadrant. No free intraperitoneal gas. IMPRESSION:  Nasogastric tube tip within the mid body of the stomach. Electronically Signed   By: Fidela Salisbury M.D.   On: 09/03/2021 00:51   CT ABDOMEN PELVIS W CONTRAST  Result Date: 09/02/2021 CLINICAL DATA:  Bowel obstruction suspected. Abdominal pain. Chemotherapy. Three weeks ago. EXAM: CT ABDOMEN AND PELVIS WITH CONTRAST TECHNIQUE: Multidetector CT imaging of the abdomen and pelvis was performed using the standard protocol following bolus administration of intravenous contrast. CONTRAST:  26m OMNIPAQUE IOHEXOL 350 MG/ML SOLN COMPARISON:  CT abdomen and pelvis 08/27/2021. FINDINGS: Lower chest: There is a new trace left pleural effusion. There is atelectasis in the bilateral lung bases. Hepatobiliary: No focal liver abnormality is seen. No gallstones, gallbladder wall thickening, or biliary dilatation. Pancreas: Unremarkable. No pancreatic ductal dilatation or surrounding inflammatory changes. Spleen: Normal in size without focal abnormality. Adrenals/Urinary Tract: Adrenal glands are unremarkable. Kidneys are normal, without renal calculi, focal lesion, or hydronephrosis. Bladder is unremarkable. Stomach/Bowel: There are dilated mid and proximal small bowel loops with air-fluid levels measuring up to 5 cm. Degree of distention has mildly increased. There is also new moderate air-fluid level within the stomach. Transition point is likely in the mid abdomen where there are multiple small bowel loops demonstrating adhesive disease to the anterior abdominal wall. This finding is unchanged. The colon is nondilated. The appendix is not seen. There is no pneumatosis or free air identified. Vascular/Lymphatic: Aortic atherosclerosis. No enlarged abdominal or pelvic lymph nodes. Nonenlarged cardiophrenic and ileocecal lymph nodes are unchanged. Reproductive: Prostate is unremarkable. Other: There are small fat containing bilateral inguinal hernias. There is a small amount of free fluid in the anterior right abdomen which  has mildly increased. There is mesenteric/omental haziness in the left upper quadrant and anterior upper abdomen similar to the prior examination. Musculoskeletal: There are bilateral pars interarticularis defects at L5 without significant listhesis. IMPRESSION: 1. Again seen are findings of small-bowel obstruction. Transition point is likely in the mid abdomen where there are clustered small bowel loops adhered to the anterior abdominal wall. Can not exclude underlying obstructive lesion. Degree of distention has increased when compared to the prior exam. There is now air-fluid level in the stomach. 2. Increasing free fluid in the right abdomen. 3. New small left pleural effusion. 4. Stable diffuse omental haziness is unchanged, concerning for metastatic disease. Electronically Signed   By: AWarren Lacy  Dagoberto Reef M.D.   On: 09/02/2021 20:30   IR GASTROSTOMY TUBE MOD SED  Result Date: 09/11/2021 CLINICAL DATA:  History of metastatic colon carcinoma with persistent small-bowel obstruction and request to place a venting gastrostomy tube for symptomatic relief. EXAM: PERCUTANEOUS GASTROSTOMY TUBE PLACEMENT ANESTHESIA/SEDATION: Moderate (conscious) sedation was employed during this procedure. A total of Versed 4.0 mg and Fentanyl 100 mcg was administered intravenously by radiology nursing. Moderate Sedation Time: 15 minutes. The patient's level of consciousness and vital signs were monitored continuously by radiology nursing throughout the procedure under my direct supervision. CONTRAST:  67m OMNIPAQUE IOHEXOL 300 MG/ML  SOLN MEDICATIONS: 2 g IV Ancef. IV antibiotic was administered in an appropriate time interval prior to needle puncture of the skin. During the procedure the patient received 0.5 mg IV glucagon. FLUOROSCOPY TIME:  2 minutes and 24 seconds.  51.0 mGy. PROCEDURE: The procedure, risks, benefits, and alternatives were explained to the patient. Questions regarding the procedure were encouraged and answered. The  patient understands and consents to the procedure. A time-out was performed prior to initiating the procedure. A 5-French catheter was advanced through the patient's mouth under fluoroscopy into the esophagus and to the level of the stomach. This catheter was used to insufflate the stomach with air under fluoroscopy. The abdominal wall was prepped with chlorhexidine in a sterile fashion, and a sterile drape was applied covering the operative field. A sterile gown and sterile gloves were used for the procedure. Local anesthesia was provided with 1% Lidocaine. A skin incision was made in the upper abdominal wall. Under fluoroscopy, an 18 gauge trocar needle was advanced into the stomach. Contrast injection was performed to confirm intraluminal position of the needle tip. A single T tack was then deployed in the lumen of the stomach. This was brought up to tension at the skin surface. Over a guidewire, a 9-French sheath was advanced into the lumen of the stomach. The wire was left in place as a safety wire. A loop snare device from a percutaneous gastrostomy kit was then advanced into the stomach. A floppy guide wire was advanced through the orogastric catheter under fluoroscopy in the stomach. The loop snare advanced through the percutaneous gastric access was used to snare the guide wire. This allowed withdrawal of the loop snare out of the patient's mouth by retraction of the orogastric catheter and wire. A 20-French bumper retention gastrostomy tube was looped around the snare device. It was then pulled back through the patient's mouth. The retention bumper was brought up to the anterior gastric wall. The T tack suture was cut at the skin. The exiting gastrostomy tube was cut to appropriate length and a feeding adapter applied. The catheter was injected with contrast material to confirm position and a fluoroscopic spot image saved. The tube was then flushed with saline. A dressing was applied over the gastrostomy  exit site. COMPLICATIONS: None. FINDINGS: The stomach distended well with air allowing safe placement of the gastrostomy tube. After placement, the tip of the gastrostomy tube lies in the body of the stomach. IMPRESSION: Percutaneous gastrostomy with placement of a 20-French bumper retention tube in the body of the stomach. Electronically Signed   By: GAletta EdouardM.D.   On: 09/11/2021 16:11   DG CHEST PORT 1 VIEW  Result Date: 09/18/2021 CLINICAL DATA:  Left PICC placement EXAM: PORTABLE CHEST 1 VIEW COMPARISON:  09/14/2021 FINDINGS: Single frontal view of the chest demonstrates stable right chest wall port. Left-sided PICC tip overlies the superior vena  cava. Cardiac silhouette is stable. Stable left basilar consolidation. Small effusion is suspected. Right chest is clear. No pneumothorax. IMPRESSION: 1. Left-sided PICC tip overlying superior vena cava. 2. Continued left basilar consolidation and likely small effusion. Electronically Signed   By: Randa Ngo M.D.   On: 09/18/2021 17:32   DG ABD ACUTE 2+V W 1V CHEST  Result Date: 09/30/2021 CLINICAL DATA:  Abdominal pain. EXAM: DG ABDOMEN ACUTE WITH 1 VIEW CHEST COMPARISON:  Abdominal radiographs 09/14/2021 CT abdomen and pelvis 09/02/2021 FINDINGS: Large body habitus. Right chest wall porta catheter with tip overlying the superior vena cava/right atrial junction. Cardiac silhouette and mediastinal contours are within normal limits. Mild calcification within aortic arch. The lungs are clear. No pleural effusion or pneumothorax. A gastrostomy tube again overlies the left upper quadrant. There are air-fluid levels within multiple loops of bowel on upright view. There may be mild distention of small-bowel loops measuring up to approximately 4 cm in caliber No portal venous gas or pneumatosis is seen. No subdiaphragmatic free air on upright view. IMPRESSION: There are air-fluid levels throughout the small bowel. There is likely dilatation of some small  bowel loops at least 4 cm. Note is made of small bowel obstruction with similar small bowel dilatation due to adhesions to the anterior abdominal wall seen on prior CT. Gastrostomy tube is again noted. Electronically Signed   By: Yvonne Kendall M.D.   On: 09/30/2021 09:29   DG Abd Portable 1V-Small Bowel Obstruction Protocol-24 hr delay  Result Date: 09/04/2021 CLINICAL DATA:  Small-bowel obstruction. Bowel obstruction protocol/24 delay image. EXAM: PORTABLE ABDOMEN - 1 VIEW COMPARISON:  Radiographs 09/03/2021 and 08/27/2021.  CT 09/02/2021. FINDINGS: 1120 hours. Two views obtained. Tip of the nasogastric tube projects over the distal stomach. The enteric contrast has passed into the colon which appears decompressed. Previously noted small bowel distension is improved. No extraluminal contrast or air collections are identified. Telemetry leads overlie the chest and upper abdomen. IMPRESSION: Antegrade passage of contrast into decompressed colon with improved small bowel dilatation. No evidence of bowel obstruction or perforation. Electronically Signed   By: Richardean Sale M.D.   On: 09/04/2021 13:43   DG Abd Portable 1V-Small Bowel Obstruction Protocol-initial, 8 hr delay  Result Date: 09/03/2021 CLINICAL DATA:  Small-bowel obstruction. EXAM: PORTABLE ABDOMEN - 1 VIEW COMPARISON:  09/03/2021 FINDINGS: Nasogastric tube overlies expected mid body of the stomach. Contrast is seen within the gastric fundus. There has been little antegrade passage of contrast into the remainder of the abdomen. Multiple loops of gas-filled dilated small bowel are seen within the epigastrium and left abdomen in keeping with changes of a mid small bowel obstruction. No gross free intraperitoneal gas. No organomegaly. IMPRESSION: No significant antegrade passage of contrast from the gastric lumen. Persistent findings in keeping with a mid small bowel obstruction. Electronically Signed   By: Fidela Salisbury M.D.   On: 09/03/2021  19:48   [place] EKG SITE RITE  Result Date: 09/30/2021 If Site Rite image not attached, placement could not be confirmed due to current cardiac rhythm.  [place] EKG Site Rite  Result Date: 09/18/2021 If Site Rite image not attached, placement could not be confirmed due to current cardiac rhythm.  [place] EKG SITE RITE  Result Date: 09/18/2021 If Site Rite image not attached, placement could not be confirmed due to current cardiac rhythm.    Future Appointments  Date Time Provider Star  09/30/2021  3:30 PM [facility] 1 [facility] [place]  10/01/2021  8:30  AM [facility] INFUSION [facility] None  10/01/2021  8:35 AM Pickenpack-Cousar, [name], NP [facility] None  10/01/2021 11:00 AM [name], MD [facility] None      LOS: 0 days    Addendum  I have seen the patient, examined him. I agree with the assessment and and plan and have edited the notes.   Antoino is overall feeling better today, pain is overall controlled. He would like to have apple sauce,something with a little bit test. I explain the risk of bowel obstruction with fibers which he understands. I have noticed that he is on oxygen, he states it's because of the high dose narcotics. Lab reviewed, Cr and tbil are slightly up today, he is getting hydration. Pt is not ready for hospice. I am willing to give him one more cycle chemo, likely in next few days in hospital.  I discussed that we need to look into hospice if his overall symptoms (severe pain and recurrent N/V) do not improve after second cycle chemo, he is open to that. We talked for a while, he would like to have a trip with his family again if he is able to do in future. He understands he is terminal, but not ready to diet yet. I hate to take his hope away, this is a very difficult situation. I will f/u.   [name]  10/01/2021

## 2021-10-01 ENCOUNTER — Inpatient Hospital Stay: Payer: BC Managed Care – PPO

## 2021-10-01 ENCOUNTER — Observation Stay (HOSPITAL_COMMUNITY): Payer: BC Managed Care – PPO

## 2021-10-01 ENCOUNTER — Inpatient Hospital Stay: Payer: BC Managed Care – PPO | Admitting: Nurse Practitioner

## 2021-10-01 ENCOUNTER — Inpatient Hospital Stay: Payer: BC Managed Care – PPO | Admitting: Hematology

## 2021-10-01 DIAGNOSIS — D701 Agranulocytosis secondary to cancer chemotherapy: Secondary | ICD-10-CM | POA: Diagnosis present

## 2021-10-01 DIAGNOSIS — Z888 Allergy status to other drugs, medicaments and biological substances status: Secondary | ICD-10-CM | POA: Diagnosis not present

## 2021-10-01 DIAGNOSIS — E669 Obesity, unspecified: Secondary | ICD-10-CM | POA: Diagnosis present

## 2021-10-01 DIAGNOSIS — R109 Unspecified abdominal pain: Secondary | ICD-10-CM | POA: Diagnosis present

## 2021-10-01 DIAGNOSIS — C786 Secondary malignant neoplasm of retroperitoneum and peritoneum: Secondary | ICD-10-CM | POA: Diagnosis present

## 2021-10-01 DIAGNOSIS — R52 Pain, unspecified: Secondary | ICD-10-CM | POA: Diagnosis not present

## 2021-10-01 DIAGNOSIS — K2100 Gastro-esophageal reflux disease with esophagitis, without bleeding: Secondary | ICD-10-CM | POA: Diagnosis not present

## 2021-10-01 DIAGNOSIS — T451X5A Adverse effect of antineoplastic and immunosuppressive drugs, initial encounter: Secondary | ICD-10-CM | POA: Diagnosis present

## 2021-10-01 DIAGNOSIS — Z79899 Other long term (current) drug therapy: Secondary | ICD-10-CM | POA: Diagnosis not present

## 2021-10-01 DIAGNOSIS — R739 Hyperglycemia, unspecified: Secondary | ICD-10-CM | POA: Diagnosis present

## 2021-10-01 DIAGNOSIS — R112 Nausea with vomiting, unspecified: Secondary | ICD-10-CM

## 2021-10-01 DIAGNOSIS — R9431 Abnormal electrocardiogram [ECG] [EKG]: Secondary | ICD-10-CM | POA: Diagnosis not present

## 2021-10-01 DIAGNOSIS — E440 Moderate protein-calorie malnutrition: Secondary | ICD-10-CM | POA: Diagnosis present

## 2021-10-01 DIAGNOSIS — K565 Intestinal adhesions [bands], unspecified as to partial versus complete obstruction: Secondary | ICD-10-CM | POA: Diagnosis present

## 2021-10-01 DIAGNOSIS — K219 Gastro-esophageal reflux disease without esophagitis: Secondary | ICD-10-CM | POA: Diagnosis present

## 2021-10-01 DIAGNOSIS — C189 Malignant neoplasm of colon, unspecified: Secondary | ICD-10-CM | POA: Diagnosis not present

## 2021-10-01 DIAGNOSIS — G893 Neoplasm related pain (acute) (chronic): Secondary | ICD-10-CM | POA: Diagnosis present

## 2021-10-01 DIAGNOSIS — Z7189 Other specified counseling: Secondary | ICD-10-CM | POA: Diagnosis not present

## 2021-10-01 DIAGNOSIS — F411 Generalized anxiety disorder: Secondary | ICD-10-CM | POA: Diagnosis present

## 2021-10-01 DIAGNOSIS — Z8616 Personal history of COVID-19: Secondary | ICD-10-CM | POA: Diagnosis not present

## 2021-10-01 DIAGNOSIS — C180 Malignant neoplasm of cecum: Secondary | ICD-10-CM | POA: Diagnosis present

## 2021-10-01 DIAGNOSIS — Z20822 Contact with and (suspected) exposure to covid-19: Secondary | ICD-10-CM | POA: Diagnosis present

## 2021-10-01 DIAGNOSIS — E785 Hyperlipidemia, unspecified: Secondary | ICD-10-CM | POA: Diagnosis present

## 2021-10-01 DIAGNOSIS — D630 Anemia in neoplastic disease: Secondary | ICD-10-CM | POA: Diagnosis present

## 2021-10-01 DIAGNOSIS — Z6831 Body mass index (BMI) 31.0-31.9, adult: Secondary | ICD-10-CM | POA: Diagnosis not present

## 2021-10-01 DIAGNOSIS — R0902 Hypoxemia: Secondary | ICD-10-CM | POA: Diagnosis present

## 2021-10-01 DIAGNOSIS — Z79891 Long term (current) use of opiate analgesic: Secondary | ICD-10-CM | POA: Diagnosis not present

## 2021-10-01 DIAGNOSIS — Z515 Encounter for palliative care: Secondary | ICD-10-CM | POA: Diagnosis not present

## 2021-10-01 DIAGNOSIS — Z931 Gastrostomy status: Secondary | ICD-10-CM | POA: Diagnosis not present

## 2021-10-01 DIAGNOSIS — N179 Acute kidney failure, unspecified: Secondary | ICD-10-CM | POA: Diagnosis present

## 2021-10-01 DIAGNOSIS — E876 Hypokalemia: Secondary | ICD-10-CM | POA: Diagnosis present

## 2021-10-01 LAB — CBC WITH DIFFERENTIAL/PLATELET
Abs Immature Granulocytes: 0.07 10*3/uL (ref 0.00–0.07)
Basophils Absolute: 0 10*3/uL (ref 0.0–0.1)
Basophils Relative: 1 %
Eosinophils Absolute: 0 10*3/uL (ref 0.0–0.5)
Eosinophils Relative: 0 %
HCT: 39.7 % (ref 39.0–52.0)
Hemoglobin: 12.5 g/dL — ABNORMAL LOW (ref 13.0–17.0)
Immature Granulocytes: 3 %
Lymphocytes Relative: 22 %
Lymphs Abs: 0.6 10*3/uL — ABNORMAL LOW (ref 0.7–4.0)
MCH: 28.3 pg (ref 26.0–34.0)
MCHC: 31.5 g/dL (ref 30.0–36.0)
MCV: 90 fL (ref 80.0–100.0)
Monocytes Absolute: 0.9 10*3/uL (ref 0.1–1.0)
Monocytes Relative: 32 %
Neutro Abs: 1.2 10*3/uL — ABNORMAL LOW (ref 1.7–7.7)
Neutrophils Relative %: 42 %
Platelets: 271 10*3/uL (ref 150–400)
RBC: 4.41 MIL/uL (ref 4.22–5.81)
RDW: 15.3 % (ref 11.5–15.5)
WBC: 2.8 10*3/uL — ABNORMAL LOW (ref 4.0–10.5)
nRBC: 0 % (ref 0.0–0.2)

## 2021-10-01 LAB — COMPREHENSIVE METABOLIC PANEL
ALT: 24 U/L (ref 0–44)
AST: 24 U/L (ref 15–41)
Albumin: 2.8 g/dL — ABNORMAL LOW (ref 3.5–5.0)
Alkaline Phosphatase: 111 U/L (ref 38–126)
Anion gap: 9 (ref 5–15)
BUN: 29 mg/dL — ABNORMAL HIGH (ref 6–20)
CO2: 33 mmol/L — ABNORMAL HIGH (ref 22–32)
Calcium: 8.6 mg/dL — ABNORMAL LOW (ref 8.9–10.3)
Chloride: 97 mmol/L — ABNORMAL LOW (ref 98–111)
Creatinine, Ser: 1.34 mg/dL — ABNORMAL HIGH (ref 0.61–1.24)
GFR, Estimated: >60 mL/min (ref >60)
Glucose, Bld: 117 mg/dL — ABNORMAL HIGH (ref 70–99)
Potassium: 4.6 mmol/L (ref 3.5–5.1)
Sodium: 139 mmol/L (ref 135–145)
Total Bilirubin: 1.4 mg/dL — ABNORMAL HIGH (ref 0.3–1.2)
Total Protein: 7.3 g/dL (ref 6.5–8.1)

## 2021-10-01 LAB — PHOSPHORUS: Phosphorus: 4.6 mg/dL (ref 2.5–4.6)

## 2021-10-01 LAB — ECHOCARDIOGRAM COMPLETE
Area-P 1/2: 3.58 cm2
Height: 71 in
S' Lateral: 2.8 cm
Weight: 4127.01 oz

## 2021-10-01 LAB — MAGNESIUM: Magnesium: 2.6 mg/dL — ABNORMAL HIGH (ref 1.7–2.4)

## 2021-10-01 MED ORDER — SODIUM CHLORIDE 0.9% FLUSH
10.0000 mL | Freq: Two times a day (BID) | INTRAVENOUS | Status: DC
  Administered 2021-10-04 – 2021-10-14 (×12): 10 mL

## 2021-10-01 MED ORDER — PERFLUTREN LIPID MICROSPHERE
1.0000 mL | INTRAVENOUS | Status: AC | PRN
  Administered 2021-10-01: 2 mL via INTRAVENOUS
  Filled 2021-10-01: qty 10

## 2021-10-01 MED ORDER — ENOXAPARIN SODIUM 60 MG/0.6ML IJ SOSY
55.0000 mg | PREFILLED_SYRINGE | INTRAMUSCULAR | Status: DC
  Administered 2021-10-01: 55 mg via SUBCUTANEOUS
  Filled 2021-10-01: qty 0.6

## 2021-10-01 MED ORDER — ALTEPLASE 2 MG IJ SOLR
2.0000 mg | Freq: Once | INTRAMUSCULAR | Status: AC
  Administered 2021-10-01: 2 mg
  Filled 2021-10-01: qty 2

## 2021-10-01 MED ORDER — CHLORHEXIDINE GLUCONATE CLOTH 2 % EX PADS
6.0000 | MEDICATED_PAD | Freq: Every day | CUTANEOUS | Status: DC
  Administered 2021-10-01 – 2021-10-14 (×14): 6 via TOPICAL

## 2021-10-01 NOTE — Progress Notes (Signed)
°  Transition of Care [facility]) Screening Note   Patient Details  Name: MOLLY MASELLI Date of Birth: 10/27/1965   Transition of Care [facility] CM/SW Contact:    Lennart Pall, LCSW Phone Number: 10/01/2021, 11:24 AM    Transition of Care Department [facility]) has reviewed patient and no TOC needs have been identified at this time. We will continue to monitor patient advancement through interdisciplinary progression rounds. If new patient transition needs arise, please place a TOC consult.

## 2021-10-01 NOTE — Plan of Care (Signed)
  Problem: Activity: Goal: Risk for activity intolerance will decrease Outcome: Progressing   Problem: Pain Managment: Goal: General experience of comfort will improve Outcome: Progressing   Problem: Safety: Goal: Ability to remain free from injury will improve Outcome: Progressing   

## 2021-10-01 NOTE — Progress Notes (Signed)
An additional 28ml of blood was aspirated after initial 77ml flush.

## 2021-10-01 NOTE — Progress Notes (Signed)
PROGRESS NOTE    Ricardo [name]  IBB:[id number] DOB: Sep 21, 1965 DOA: 09/30/2021 PCP: [name], MD   Brief Narrative:  56 y.o. 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   ED Course: Initial vital signs were temperature 99.1 F, pulse 133, respiration 20, BP 138/91 mmHg O2 sat 100% on room air.  The patient received 2000 mL of LR bolus hydromorphone 2 mg IVP x1 and Compazine 10 mg IVP x1.   Lab work: CBC is her white count 2.2, hemoglobin 13.5 g/dL platelets 252.  CMP with potassium of 3.2 and chloride 97 mmol/L.  LFTs were normal SF4 and albumin of 3.0 g/dL.  Glucose 113, BUN 34 and creatinine 1.12 mg/dL.   Review of Systems: As per HPI otherwise all other systems reviewed and are negative.   Subjective: A/O x4, states nausea/vomiting has resolved.  Abdominal pain now significantly improved with PCA pump.   Assessment & Plan:  Covid vaccination;  Principal Problem:   Intractable abdominal pain Active Problems:   Nausea and vomiting   GERD (gastroesophageal reflux disease)   Colon cancer metastasized to multiple sites [facility])   Class 2 obesity   Leukopenia   Hypokalemia  Colon cancer metastasized to multiple sites ([place]) -Poor overall prognosis. -1/18 spoke with Dr. Burr Medico.  And she is aware that patient has been rehospitalized.  States will come I see patient today.  Will await any additional recommendations   Intractable abdominal pain -Patient pain controlled on PCA pump + fentanyl  patch. - Had a long conversation with patient concerning his goals for discharge.  Patient does not want care team to give up on him though he understands that serious condition. - Given patient's wish to be active at discharge will increase his Fentanyl patch to 125 mcg.  Due for change on 1/19. - 1/19 after placement of increased Fentanyl patch titrate PCA pump down -We will await any additional recommendations from Dr. Burr Medico oncology  Nausea and vomiting - Per patient mostly resolved.  Requested to start consuming p.o. - Currently continue n.p.o. -  Moderate protein malnutrition ([place]) -1/18 awaiting placement of PICC line by IR, IV team unsuccessful - Restart TPN per pharmacy as soon as PICC line placed.  Hypokalemia -Potassium goal> 4  Acute Diastolic CHF - 4/50 echocardiogram consistent with acute diastolic failure. - Strict in and out - Daily weight  Sinus tachycardia -Continue IV fluids.   -Metoprolol 12.5 mg BID -1/18 currently NSR     GERD (gastroesophageal reflux disease) -Pantoprazole has been increased to 40 mg IVP BID.   GAD (generalized anxiety disorder) -Continue alprazolam as needed. -Parenteral lorazepam as needed if unable to PO.  Class 2 obesity    Normocytic anemia -Anemia panel pending - Monitor H/H     DVT prophylaxis: Lovenox per pharmacy Code Status: Partial Family Communication:  Status is: Inpatient    Dispo: The patient is from: Home              Anticipated d/c is to: Home  Anticipated d/c date is: 2 days              Patient currently is not medically stable to d/c.      Consultants:  Palliative care   Procedures/Significant Events:  1/18 Echocardiogram Left Ventricle: Left ventricular ejection fraction, by estimation, is 65  to 70%. The left ventricle has normal function. The left ventricle has no  regional wall motion abnormalities. Definity contrast agent was given IV  to delineate the left ventricular    endocardial borders. The left ventricular internal cavity size was normal  in size. There is no left ventricular hypertrophy. Left ventricular  diastolic parameters are consistent with Grade I diastolic dysfunction  (impaired relaxation). Normal left  ventricular filling pressure.   Right Ventricle: The right ventricular size is normal. No increase in  right ventricular wall thickness. Right ventricular systolic function is  normal.   Left Atrium: Left atrial size was normal in size.   Right Atrium: Right atrial size was normal in size.   Pericardium: There is no evidence of pericardial effusion.   Mitral Valve: The mitral valve is normal in structure. No evidence of  mitral valve regurgitation. No evidence of mitral valve stenosis.   Tricuspid Valve: The tricuspid valve is normal in structure. Tricuspid  valve regurgitation is not demonstrated. No evidence of tricuspid  stenosis.   Aortic Valve: The aortic valve is normal in structure. Aortic valve  regurgitation is not visualized. No aortic stenosis is present.   Pulmonic Valve: The pulmonic valve was normal in structure. Pulmonic valve  regurgitation is not visualized. No evidence of pulmonic stenosis.   Aorta: The aortic root is normal in size and structure.   Venous: The inferior vena cava is normal in size with greater than 50%  respiratory variability, suggesting right atrial pressure of 3 mmHg.    I have personally reviewed and interpreted all radiology studies and my findings are as above.  VENTILATOR SETTINGS:    Cultures   Antimicrobials:    Devices    LINES / TUBES:      Continuous Infusions:  0.9 % NaCl with KCl 40 mEq / L 125 mL/hr at 10/01/21 0731   lactated ringers Stopped (09/30/21 1626)     Objective: Vitals:   10/01/21 0015 10/01/21 0150 10/01/21 0333 10/01/21 0427  BP:  (!) 140/93  132/79  Pulse:  (!) 114  (!) 112  Resp: 12 18 16 16   Temp:  98.3 F (36.8 C)  97.9 F (36.6 C)   TempSrc:  Oral  Oral  SpO2: 94% 93% 94% 93%  Weight:      Height:        Intake/Output Summary (Last 24 hours) at 10/01/2021 5397 Last data filed at 10/01/2021 0600 Gross per 24 hour  Intake 4219.79 ml  Output 1200 ml  Net 3019.79 ml   Filed Weights   09/30/21 2035  Weight: 117 kg    Examination:  General: A/O x4, No acute respiratory distress Eyes: negative scleral hemorrhage, negative anisocoria, negative icterus ENT: Negative Runny nose, negative gingival bleeding, Neck:  Negative scars, masses, torticollis, lymphadenopathy, JVD Lungs: Clear to auscultation bilaterally without wheezes or crackles Cardiovascular: Regular rate and rhythm without murmur gallop or rub normal S1 and S2 Abdomen: negative abdominal pain, nondistended, negative soft, bowel sounds, no rebound, no ascites, no appreciable mass Extremities: No significant cyanosis, clubbing, or edema bilateral lower extremities Skin: Negative rashes, lesions, ulcers Psychiatric:  Negative depression, negative anxiety, negative fatigue, negative mania  Central nervous system:  Cranial nerves II through XII intact, tongue/uvula midline, all extremities muscle strength 5/5, sensation intact throughout, negative dysarthria, negative expressive aphasia, negative receptive aphasia.  .     Data Reviewed: Care during the described time interval was provided by me .  I have reviewed this patient's available data, including medical history, events of note, physical examination, and all test results as part of my evaluation.   CBC: Recent Labs  Lab 09/30/21 0830  WBC 2.2*  NEUTROABS 1.1*  HGB 13.5  HCT 41.2  MCV 86.6  PLT 779   Basic Metabolic Panel: Recent Labs  Lab 09/30/21 0830  NA 138  K 3.2*  CL 97*  CO2 31  GLUCOSE 113*  BUN 34*  CREATININE 1.12  CALCIUM 8.5*  MG 2.2  PHOS 3.0   GFR: Estimated Creatinine Clearance: 95.8 mL/min (by C-G formula based on SCr of 1.12 mg/dL). Liver Function  Tests: Recent Labs  Lab 09/30/21 0830  AST 21  ALT 21  ALKPHOS 120  BILITOT 1.2  PROT 7.5  ALBUMIN 3.0*   Recent Labs  Lab 09/30/21 0830  LIPASE 33   No results for input(s): AMMONIA in the last 168 hours. Coagulation Profile: No results for input(s): INR, PROTIME in the last 168 hours. Cardiac Enzymes: No results for input(s): CKTOTAL, CKMB, CKMBINDEX, TROPONINI in the last 168 hours. BNP (last 3 results) No results for input(s): PROBNP in the last 8760 hours. HbA1C: No results for input(s): HGBA1C in the last 72 hours. CBG: Recent Labs  Lab 09/24/21 0755  GLUCAP 96   Lipid Profile: No results for input(s): CHOL, HDL, LDLCALC, TRIG, CHOLHDL, LDLDIRECT in the last 72 hours. Thyroid Function Tests: No results for input(s): TSH, T4TOTAL, FREET4, T3FREE, THYROIDAB in the last 72 hours. Anemia Panel: No results for input(s): VITAMINB12, FOLATE, FERRITIN, TIBC, IRON, RETICCTPCT in the last 72 hours. Urine analysis:    Component Value Date/Time   COLORURINE YELLOW 09/03/2021 0800   APPEARANCEUR CLEAR 09/03/2021 0800   LABSPEC >1.046 (H) 09/03/2021 0800   PHURINE 5.0 09/03/2021 0800   GLUCOSEU NEGATIVE 09/03/2021 0800   HGBUR NEGATIVE 09/03/2021 0800   BILIRUBINUR NEGATIVE 09/03/2021 0800   KETONESUR NEGATIVE 09/03/2021 0800   PROTEINUR NEGATIVE 09/03/2021 0800   NITRITE NEGATIVE 09/03/2021 0800   LEUKOCYTESUR NEGATIVE 09/03/2021 0800   Sepsis Labs: @LABRCNTIP (procalcitonin:4,lacticidven:4)  )No results found for this or any previous visit (from the past 240 hour(s)).       Radiology Studies: DG ABD ACUTE 2+V W 1V CHEST  Result Date: 09/30/2021 CLINICAL DATA:  Abdominal pain. EXAM: DG ABDOMEN ACUTE WITH 1 VIEW CHEST COMPARISON:  Abdominal radiographs 09/14/2021 CT abdomen and pelvis 09/02/2021 FINDINGS: Large body habitus. Right chest wall porta catheter with tip overlying the superior vena cava/right atrial junction. Cardiac silhouette and mediastinal  contours are within normal limits. Mild calcification within aortic arch. The lungs are clear. No pleural effusion or pneumothorax. A gastrostomy tube again overlies the left upper quadrant. There are air-fluid levels within multiple loops of bowel on upright view. There may be mild distention of small-bowel loops measuring up to approximately 4 cm in caliber No portal venous gas or pneumatosis is seen. No subdiaphragmatic free air on upright view. IMPRESSION: There are air-fluid levels throughout the small bowel. There is likely dilatation of some small bowel loops at least 4 cm. Note is made of small bowel obstruction with similar small bowel dilatation due to adhesions to the anterior abdominal wall seen on prior  CT. Gastrostomy tube is again noted. Electronically Signed   By: Yvonne Kendall M.D.   On: 09/30/2021 09:29   [place] EKG SITE RITE  Result Date: 09/30/2021 If Site Rite image not attached, placement could not be confirmed due to current cardiac rhythm.       Scheduled Meds:  dexamethasone (DECADRON) injection  4 mg Intravenous Q24H   fentaNYL  1 patch Transdermal Q72H   And   fentaNYL  1 patch Transdermal Q72H   HYDROmorphone   Intravenous Q4H   metoprolol tartrate  12.5 mg Oral BID   pantoprazole (PROTONIX) IV  40 mg Intravenous Q12H   Continuous Infusions:  0.9 % NaCl with KCl 40 mEq / L 125 mL/hr at 10/01/21 0731   lactated ringers Stopped (09/30/21 1626)     LOS: 0 days   The patient is critically ill with multiple organ systems failure and requires high complexity decision making for assessment and support, frequent evaluation and titration of therapies, application of advanced monitoring technologies and extensive interpretation of multiple databases. Critical Care Time devoted to patient care services described in this note  Time spent: 40 minutes     Brinn Westby, Geraldo Docker, MD [facility]   If 7PM-7AM, please contact night-coverage 10/01/2021, 7:33 AM

## 2021-10-01 NOTE — Progress Notes (Signed)
TPA removed from port via aspiration. Blood was returned after patient was instructed to turn his head, take a deep breath, and cough. 37ml blood return was obtained. Caps were changed and line was flushed with 14ml NS. Patient tolerated procedure well. Patient also stated this is a typical "trick" to make the port work. Port access is saline locked at this time. Flushes without difficulty.

## 2021-10-01 NOTE — Plan of Care (Signed)
Plan of care initiated and discussed with the patient and his wife. 

## 2021-10-01 NOTE — Progress Notes (Signed)
The patient's nurse was informed of aspiration and flush. The RN was also informed that the patient is aware of the difficulty and said that he often has to turn his head and cough to get blood return.

## 2021-10-01 NOTE — Progress Notes (Signed)
°  Echocardiogram 2D Echocardiogram has been performed.  Darlina Sicilian M 10/01/2021, 8:46 AM

## 2021-10-01 NOTE — Progress Notes (Signed)
Spoke with Dr. Sherral Hammers re: PICC order, after the discussion about the previous PICC. Refer to IR for placing PICC line due to bedside PICC placement was having hard time placing it and also, PICC catheter was difficult to drop in the SVC due to R portacath. MD will place order for IR to do and discontinue order for bedside PICC.

## 2021-10-02 ENCOUNTER — Inpatient Hospital Stay: Payer: BC Managed Care – PPO | Admitting: Nurse Practitioner

## 2021-10-02 ENCOUNTER — Inpatient Hospital Stay: Payer: BC Managed Care – PPO

## 2021-10-02 ENCOUNTER — Inpatient Hospital Stay: Payer: BC Managed Care – PPO | Admitting: Hematology

## 2021-10-02 ENCOUNTER — Inpatient Hospital Stay (HOSPITAL_COMMUNITY): Payer: BC Managed Care – PPO

## 2021-10-02 DIAGNOSIS — C189 Malignant neoplasm of colon, unspecified: Secondary | ICD-10-CM | POA: Diagnosis not present

## 2021-10-02 DIAGNOSIS — Z7189 Other specified counseling: Secondary | ICD-10-CM

## 2021-10-02 DIAGNOSIS — R52 Pain, unspecified: Secondary | ICD-10-CM | POA: Diagnosis not present

## 2021-10-02 DIAGNOSIS — K2100 Gastro-esophageal reflux disease with esophagitis, without bleeding: Secondary | ICD-10-CM | POA: Diagnosis not present

## 2021-10-02 DIAGNOSIS — R109 Unspecified abdominal pain: Secondary | ICD-10-CM | POA: Diagnosis not present

## 2021-10-02 DIAGNOSIS — Z515 Encounter for palliative care: Secondary | ICD-10-CM

## 2021-10-02 DIAGNOSIS — E876 Hypokalemia: Secondary | ICD-10-CM

## 2021-10-02 DIAGNOSIS — E669 Obesity, unspecified: Secondary | ICD-10-CM | POA: Diagnosis not present

## 2021-10-02 HISTORY — PX: IR FLUORO GUIDE CV LINE LEFT: IMG2282

## 2021-10-02 LAB — FOLATE: Folate: 28.8 ng/mL (ref >5.9)

## 2021-10-02 LAB — VITAMIN B12: Vitamin B-12: 1418 pg/mL — ABNORMAL HIGH (ref 180–914)

## 2021-10-02 LAB — IRON AND TIBC
Iron: 29 ug/dL — ABNORMAL LOW (ref 45–182)
Saturation Ratios: 17 % — ABNORMAL LOW (ref 17.9–39.5)
TIBC: 168 ug/dL — ABNORMAL LOW (ref 250–450)
UIBC: 139 ug/dL

## 2021-10-02 LAB — RETICULOCYTES
Immature Retic Fract: 15.6 % (ref 2.3–15.9)
RBC.: 4.55 MIL/uL (ref 4.22–5.81)
Retic Count, Absolute: 72.8 10*3/uL (ref 19.0–186.0)
Retic Ct Pct: 1.6 % (ref 0.4–3.1)

## 2021-10-02 LAB — GLUCOSE, CAPILLARY: Glucose-Capillary: 124 mg/dL — ABNORMAL HIGH (ref 70–99)

## 2021-10-02 LAB — FERRITIN: Ferritin: 2480 ng/mL — ABNORMAL HIGH (ref 24–336)

## 2021-10-02 MED ORDER — HYDROMORPHONE 1 MG/ML IV SOLN: INTRAVENOUS | Status: DC

## 2021-10-02 MED ORDER — HYDROMORPHONE 1 MG/ML IV SOLN
INTRAVENOUS | Status: DC
  Administered 2021-10-02: 4 mg via INTRAVENOUS
  Administered 2021-10-03: 3.86 mg via INTRAVENOUS
  Administered 2021-10-03: 30 mg via INTRAVENOUS
  Administered 2021-10-04: 3 mL via INTRAVENOUS
  Administered 2021-10-04: 3.42 mg via INTRAVENOUS
  Administered 2021-10-04: 3 mL via INTRAVENOUS
  Administered 2021-10-04: 2 mg via INTRAVENOUS
  Administered 2021-10-04: 2.7 mg via INTRAVENOUS
  Administered 2021-10-04: 1 mg via INTRAVENOUS
  Administered 2021-10-05: 2 mg via INTRAVENOUS
  Administered 2021-10-05: 1.16 mL via INTRAVENOUS
  Administered 2021-10-05: 30 mg via INTRAVENOUS
  Administered 2021-10-05: 2.68 mL via INTRAVENOUS
  Administered 2021-10-05: 2.98 mL via INTRAVENOUS
  Administered 2021-10-05: 2.92 mg via INTRAVENOUS
  Administered 2021-10-05: 1.58 mg via INTRAVENOUS
  Administered 2021-10-05: 1.74 mL via INTRAVENOUS
  Administered 2021-10-06: 1.98 mL via INTRAVENOUS
  Administered 2021-10-07: 30 mg via INTRAVENOUS
  Administered 2021-10-07: 2.06 mg via INTRAVENOUS
  Administered 2021-10-07: 4.81 mg via INTRAVENOUS
  Administered 2021-10-07: 1.99 mL via INTRAVENOUS
  Administered 2021-10-07: 2.68 mg via INTRAVENOUS
  Administered 2021-10-07: 3.89 mg via INTRAVENOUS
  Administered 2021-10-07: 2.83 mg via INTRAVENOUS
  Administered 2021-10-08: 3.83 mg via INTRAVENOUS
  Administered 2021-10-08: 1.81 mg via INTRAVENOUS
  Administered 2021-10-08: 4.18 mg via INTRAVENOUS
  Administered 2021-10-08: 4.16 mg via INTRAVENOUS
  Filled 2021-10-02 (×4): qty 30

## 2021-10-02 MED ORDER — FENTANYL 100 MCG/HR TD PT72
1.0000 | MEDICATED_PATCH | TRANSDERMAL | Status: DC
  Administered 2021-10-02 – 2021-10-14 (×5): 1 via TRANSDERMAL
  Filled 2021-10-02 (×5): qty 1

## 2021-10-02 MED ORDER — FENTANYL 100 MCG/HR TD PT72
1.0000 | MEDICATED_PATCH | TRANSDERMAL | Status: DC
Start: 2021-10-02 — End: 2021-10-14
  Administered 2021-10-02 – 2021-10-14 (×5): 1 via TRANSDERMAL
  Filled 2021-10-02 (×4): qty 1

## 2021-10-02 MED ORDER — LACTATED RINGERS IV SOLN: INTRAVENOUS | Status: AC

## 2021-10-02 MED ORDER — INSULIN ASPART 100 UNIT/ML IJ SOLN
0.0000 [IU] | Freq: Three times a day (TID) | INTRAMUSCULAR | Status: DC
  Administered 2021-10-02 – 2021-10-04 (×7): 1 [IU] via SUBCUTANEOUS

## 2021-10-02 MED ORDER — LIDOCAINE HCL 1 % IJ SOLN
INTRAMUSCULAR | Status: AC
  Administered 2021-10-02: 5 mL
  Filled 2021-10-02: qty 20

## 2021-10-02 MED ORDER — TRAVASOL 10 % IV SOLN
INTRAVENOUS | Status: AC
  Filled 2021-10-02: qty 480

## 2021-10-02 MED ORDER — DULOXETINE HCL 30 MG PO CPEP
30.0000 mg | ORAL_CAPSULE | Freq: Every day | ORAL | Status: DC
  Administered 2021-10-02 – 2021-10-14 (×13): 30 mg via ORAL
  Filled 2021-10-02 (×13): qty 1

## 2021-10-02 MED ORDER — ENOXAPARIN SODIUM 60 MG/0.6ML IJ SOSY
60.0000 mg | PREFILLED_SYRINGE | INTRAMUSCULAR | Status: DC
  Administered 2021-10-02: 60 mg via SUBCUTANEOUS
  Filled 2021-10-02: qty 0.6

## 2021-10-02 NOTE — Procedures (Signed)
Interventional Radiology Procedure:   Indications: Malnutrition and needs PICC line for TPN  Procedure: PICC line placement  Findings: Left basilic vein is occluded and likely due to prior PICC. Dual lumen PowerPICC placed in left brachial vein.  Length = 44 cm.  Tip at SVC/RA junction.  Complications: No immediate complications noted.     EBL: Minimal  Plan: PICC is ready to use.     Sujay Grundman R. Anselm Pancoast, MD  Pager: [phone]

## 2021-10-02 NOTE — Progress Notes (Signed)
PROGRESS NOTE    Ricardo [name]  MEQ:[id number] DOB: April 14, 1966 DOA: 09/30/2021 PCP: [name], MD   Brief Narrative:  56 y.o. 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   ED Course: Initial vital signs were temperature 99.1 F, pulse 133, respiration 20, BP 138/91 mmHg O2 sat 100% on room air.  The patient received 2000 mL of LR bolus hydromorphone 2 mg IVP x1 and Compazine 10 mg IVP x1.   Lab work: CBC is her white count 2.2, hemoglobin 13.5 g/dL platelets 252.  CMP with potassium of 3.2 and chloride 97 mmol/L.  LFTs were normal SF4 and albumin of 3.0 g/dL.  Glucose 113, BUN 34 and creatinine 1.12 mg/dL.   Review of Systems: As per HPI otherwise all other systems reviewed and are negative.   Subjective: 1/19 A/O x4.  Groggy but arousable.  Follows all commands.  States pain better controlled.   Assessment & Plan:  Covid vaccination;  Principal Problem:   Intractable abdominal pain Active Problems:   Nausea and vomiting   GERD (gastroesophageal reflux disease)   Colon cancer metastasized to multiple sites [facility])   Class 2 obesity   Leukopenia   Hypokalemia  Colon cancer metastasized to multiple sites ([place]) -Poor overall prognosis. -1/18 spoke with Dr. Burr Medico.  And she is aware that patient has been rehospitalized.  States will come I see patient today.  Will await any additional recommendations -1/19 hematology oncology recommendation: -The patient is not yet ready for hospice.  We  are willing to give him 1 more cycle of chemotherapy.  PICC line has been placed.  Timing of chemo per Dr. Burr Medico.  -He has not yet started his home TPN.  PICC line has been placed.  TPN order set  has been placed. -Goals of treatment are for palliation of symptoms.  He understands that his  disease is not curable.  He agrees to limited code to include CPR and ACLS  medications, but no defibrillation or cardioversion or mechanical ventilation   Intractable abdominal pain -Patient pain controlled on PCA pump + fentanyl patch. - Had a long conversation with patient concerning his goals for discharge.  Patient does not want care team to give up on him though he understands that serious condition. - 1/19 increase Fentanyl patch to 200 mcg/hr.  Due for change on 1/19. - 1/19 have decreased Dilaudid per PCA pump -We will await any additional recommendations from Dr. Burr Medico oncology  Nausea and vomiting - Per patient mostly resolved.  Requested to start consuming p.o. - Currently continue n.p.o. -1/19 clear liquid diet  Moderate protein malnutrition ([place]) -1/18 awaiting placement of PICC line by IR, IV team unsuccessful - Restart TPN per pharmacy as soon as PICC line placed. -1/19 TPN should be restarted on 1/20 as PICC line was replaced late today.  Hypokalemia -Potassium goal> 4  Acute Diastolic CHF - 2/97 echocardiogram consistent with acute diastolic failure. - Strict in and out -2.0 L - Daily weight Filed Weights   09/30/21 2035  Weight: 117 kg     Sinus  tachycardia -Continue IV fluids.   -Metoprolol 12.5 mg BID -1/19 currently sinus tachycardia, if continues overnight will increase Metoprolol    GERD (gastroesophageal reflux disease) -Pantoprazole has been increased to 40 mg IVP BID.   GAD (generalized anxiety disorder) -Continue alprazolam as needed. -Parenteral lorazepam as needed if unable to PO. -1/19 Cymbalta 30 mg daily (should also help with pain)  Class 2  obesity    Normocytic anemia -1/19 anemia panel; consistent with anemia of chronic disease - Monitor H/H     DVT prophylaxis: Lovenox per pharmacy Code Status: Partial Family Communication:  Status is: Inpatient    Dispo: The patient is from: Home              Anticipated d/c is to: Home              Anticipated d/c date is: 2 days              Patient currently is not medically stable to d/c.      Consultants:  Palliative care IR Oncology Dr. Burr Medico   Procedures/Significant Events:  1/18 Echocardiogram Left Ventricle: Left ventricular ejection fraction, by estimation, is 65  to 70%. The left ventricle has normal function. The left ventricle has no  regional wall motion abnormalities. Definity contrast agent was given IV  to delineate the left ventricular   endocardial borders. The left ventricular internal cavity size was normal  in size. There is no left ventricular hypertrophy. Left ventricular  diastolic parameters are consistent with Grade I diastolic dysfunction  (impaired relaxation). Normal left  ventricular filling pressure.     I have personally reviewed and interpreted all radiology studies and my findings are as above.  VENTILATOR SETTINGS:    Cultures   Antimicrobials:    Devices    LINES / TUBES:  Dual lumen PowerPICC placed in left brachial vein 1/19>>>>    Continuous Infusions:  lactated ringers 125 mL/hr at 10/02/21 0531   TPN ADULT (ION)       Objective: Vitals:   10/02/21 0337 10/02/21 0549 10/02/21 0828 10/02/21 1211  BP:  (!) 127/91    Pulse:  (!) 104    Resp: 19 17 16 14   Temp:  97.7 F (36.5 C)    TempSrc:      SpO2: 96% 97% 96% 95%  Weight:      Height:        Intake/Output Summary (Last 24 hours) at 10/02/2021 1226 Last data filed at 10/02/2021 2751 Gross per 24 hour  Intake 2080.02 ml  Output 5950 ml  Net -3869.98 ml    Filed Weights   09/30/21 2035  Weight: 117 kg    Examination:  General: A/O  x4, No acute respiratory distress Eyes: negative scleral hemorrhage, negative anisocoria, negative icterus ENT: Negative Runny nose, negative gingival bleeding, Neck:  Negative scars, masses, torticollis, lymphadenopathy, JVD Lungs: Clear to auscultation bilaterally without wheezes or crackles Cardiovascular: Regular rate and rhythm without murmur gallop or rub normal S1 and S2 Abdomen: negative abdominal pain, nondistended, negative soft, bowel sounds, no rebound, no ascites, no appreciable mass Extremities: LEFT arm PICC in place, covered and clean No significant cyanosis, clubbing, or edema bilateral lower extremities Skin: Negative rashes, lesions, ulcers Psychiatric:  Negative depression, negative anxiety, negative fatigue, negative mania  Central nervous system:  Cranial nerves II through XII intact, tongue/uvula midline, all extremities muscle strength 5/5, sensation intact throughout, negative dysarthria, negative expressive aphasia, negative receptive aphasia.  Marland Kitchen  Data Reviewed: Care during the described time interval was provided by me .  I have reviewed this patient's available data, including medical history, events of note, physical examination, and all test results as part of my evaluation.   CBC: Recent Labs  Lab 09/30/21 0830 10/01/21 0940  WBC 2.2* 2.8*  NEUTROABS 1.1* 1.2*  HGB 13.5 12.5*  HCT 41.2 39.7  MCV 86.6 90.0  PLT 252 782    Basic Metabolic Panel: Recent Labs  Lab 09/30/21 0830 10/01/21 0940  NA 138 139  K 3.2* 4.6  CL 97* 97*  CO2 31 33*  GLUCOSE 113* 117*  BUN 34* 29*  CREATININE 1.12 1.34*  CALCIUM 8.5* 8.6*  MG 2.2 2.6*  PHOS 3.0 4.6    GFR: Estimated Creatinine Clearance: 80.1 mL/min (A) (by C-G formula based on SCr of 1.34 mg/dL (H)). Liver Function Tests: Recent Labs  Lab 09/30/21 0830 10/01/21 0940  AST 21 24  ALT 21 24  ALKPHOS 120 111  BILITOT 1.2 1.4*  PROT 7.5 7.3  ALBUMIN 3.0* 2.8*    Recent Labs  Lab  09/30/21 0830  LIPASE 33    No results for input(s): AMMONIA in the last 168 hours. Coagulation Profile: No results for input(s): INR, PROTIME in the last 168 hours. Cardiac Enzymes: No results for input(s): CKTOTAL, CKMB, CKMBINDEX, TROPONINI in the last 168 hours. BNP (last 3 results) No results for input(s): PROBNP in the last 8760 hours. HbA1C: No results for input(s): HGBA1C in the last 72 hours. CBG: No results for input(s): GLUCAP in the last 168 hours.  Lipid Profile: No results for input(s): CHOL, HDL, LDLCALC, TRIG, CHOLHDL, LDLDIRECT in the last 72 hours. Thyroid Function Tests: No results for input(s): TSH, T4TOTAL, FREET4, T3FREE, THYROIDAB in the last 72 hours. Anemia Panel: Recent Labs    10/02/21 0337  VITAMINB12 1,418*  FOLATE 28.8  FERRITIN 2,480*  TIBC 168*  IRON 29*  RETICCTPCT 1.6   Urine analysis:    Component Value Date/Time   COLORURINE YELLOW 09/03/2021 0800   APPEARANCEUR CLEAR 09/03/2021 0800   LABSPEC >1.046 (H) 09/03/2021 0800   PHURINE 5.0 09/03/2021 0800   GLUCOSEU NEGATIVE 09/03/2021 0800   HGBUR NEGATIVE 09/03/2021 0800   BILIRUBINUR NEGATIVE 09/03/2021 0800   KETONESUR NEGATIVE 09/03/2021 0800   PROTEINUR NEGATIVE 09/03/2021 0800   NITRITE NEGATIVE 09/03/2021 0800   LEUKOCYTESUR NEGATIVE 09/03/2021 0800   Sepsis Labs: @LABRCNTIP (procalcitonin:4,lacticidven:4)  )No results found for this or any previous visit (from the past 240 hour(s)).       Radiology Studies: IR Fluoro Guide CV Line Left  Result Date: 10/02/2021 INDICATION: 56 year old with malnutrition and needs PICC line for TPN. EXAM: LEFT ARM PICC LINE PLACEMENT WITH ULTRASOUND AND FLUOROSCOPIC GUIDANCE MEDICATIONS: Local anesthetic, 1% lidocaine ANESTHESIA/SEDATION: None FLUOROSCOPY TIME:  18 seconds, 3 mGy COMPLICATIONS: None immediate. PROCEDURE: The patient was advised of the possible risks and complications and agreed to undergo the procedure. The patient was then  brought to the angiographic suite for the procedure. The left arm was prepped with chlorhexidine, draped in the usual sterile fashion using maximum barrier technique (cap and mask, sterile gown, sterile gloves, large sterile sheet, hand hygiene and cutaneous antisepsis) and infiltrated locally with 1% Lidocaine. Ultrasound demonstrated patency of the left brachial vein, and this was documented with an image. Under real-time ultrasound guidance, this vein was accessed with a 21 gauge micropuncture needle and image documentation was performed. A 0.018 wire was introduced in to the vein. Over this,  a 5 [place] dual lumen lumen power injectable PICC was advanced to the lower SVC/right atrial junction. Fluoroscopy during the procedure and fluoro spot radiograph confirms appropriate catheter position. The catheter was flushed and covered with a sterile dressing. Catheter length: 44 cm FINDINGS: Ultrasound demonstrated a patent left brachial vein. Left basilic vein is not compressible and thrombosed. PICC line tip at the superior cavoatrial junction. Existing right jugular Port-A-Cath tip is also at the superior cavoatrial junction. IMPRESSION: Successful left arm power injectable PICC line placement with ultrasound and fluoroscopic guidance. The catheter is ready for use. Electronically Signed   By: Markus Daft M.D.   On: 10/02/2021 11:32   ECHOCARDIOGRAM COMPLETE  Result Date: 10/01/2021    ECHOCARDIOGRAM REPORT   Patient Name:   Ricardo [name] Eley Date of Exam: 10/01/2021 Medical Rec #:  [id number]       Height:       71.0 in Accession #:    [id number]      Weight:       257.9 lb Date of Birth:  May 25, 1966        BSA:          2.349 m Patient Age:    58 years        BP:           132/79 mmHg Patient Gender: M               HR:           112 bpm. Exam Location:  Inpatient Procedure: 2D Echo, Cardiac Doppler, Color Doppler and Intracardiac            Opacification Agent Indications:    Abnormal ECG R94.31  History:         Patient has no prior history of Echocardiogram examinations.                 Risk Factors:Dyslipidemia. GERD.  Sonographer:    Darlina Sicilian RDCS Referring Phys: [id number] DAVID MANUEL [place]  1. Left ventricular ejection fraction, by estimation, is 65 to 70%. The left ventricle has normal function. The left ventricle has no regional wall motion abnormalities. Left ventricular diastolic parameters are consistent with Grade I diastolic dysfunction (impaired relaxation).  2. Right ventricular systolic function is normal. The right ventricular size is normal.  3. The mitral valve is normal in structure. No evidence of mitral valve regurgitation. No evidence of mitral stenosis.  4. The aortic valve is normal in structure. Aortic valve regurgitation is not visualized. No aortic stenosis is present.  5. The inferior vena cava is normal in size with greater than 50% respiratory variability, suggesting right atrial pressure of 3 mmHg. FINDINGS  Left Ventricle: Left ventricular ejection fraction, by estimation, is 65 to 70%. The left ventricle has normal function. The left ventricle has no regional wall motion abnormalities. Definity contrast agent was given IV to delineate the left ventricular  endocardial borders. The left ventricular internal cavity size was normal in size. There is no left ventricular hypertrophy. Left ventricular diastolic parameters are consistent with Grade I diastolic dysfunction (impaired relaxation). Normal left ventricular filling pressure. Right Ventricle: The right ventricular size is normal. No increase in right ventricular wall thickness. Right ventricular systolic function is normal. Left Atrium: Left atrial size was normal in size. Right Atrium: Right atrial size was normal in size. Pericardium: There is no evidence of pericardial effusion. Mitral Valve: The mitral valve is normal in structure. No evidence of mitral valve  regurgitation. No evidence of mitral valve stenosis.  Tricuspid Valve: The tricuspid valve is normal in structure. Tricuspid valve regurgitation is not demonstrated. No evidence of tricuspid stenosis. Aortic Valve: The aortic valve is normal in structure. Aortic valve regurgitation is not visualized. No aortic stenosis is present. Pulmonic Valve: The pulmonic valve was normal in structure. Pulmonic valve regurgitation is not visualized. No evidence of pulmonic stenosis. Aorta: The aortic root is normal in size and structure. Venous: The inferior vena cava is normal in size with greater than 50% respiratory variability, suggesting right atrial pressure of 3 mmHg. IAS/Shunts: No atrial level shunt detected by color flow Doppler.  LEFT VENTRICLE PLAX 2D LVIDd:         5.00 cm   Diastology LVIDs:         2.80 cm   LV e' medial:    6.53 cm/s LV PW:         0.90 cm   LV E/e' medial:  7.0 LV IVS:        1.00 cm   LV e' lateral:   6.74 cm/s LVOT diam:     2.80 cm   LV E/e' lateral: 6.8 LV SV:         45 LV SV Index:   19 LVOT Area:     6.16 cm  RIGHT VENTRICLE RV S prime:     9.14 cm/s TAPSE (M-mode): 1.5 cm LEFT ATRIUM         Index LA diam:    2.50 cm 1.06 cm/m  AORTIC VALVE LVOT Vmax:   49.00 cm/s LVOT Vmean:  30.900 cm/s LVOT VTI:    0.073 m  AORTA Ao Root diam: 3.80 cm Ao Asc diam:  3.50 cm MITRAL VALVE MV Area (PHT): 3.58 cm    SHUNTS MV Decel Time: 212 msec    Systemic VTI:  0.07 m MV E velocity: 45.80 cm/s  Systemic Diam: 2.80 cm MV A velocity: 60.00 cm/s MV E/A ratio:  0.76 Skeet Latch MD Electronically signed by Skeet Latch MD Signature Date/Time: 10/01/2021/12:05:12 PM    Final         Scheduled Meds:  Chlorhexidine Gluconate Cloth  6 each Topical Daily   dexamethasone (DECADRON) injection  4 mg Intravenous Q24H   enoxaparin (LOVENOX) injection  60 mg Subcutaneous Q24H   fentaNYL  1 patch Transdermal Q72H   And   fentaNYL  1 patch Transdermal Q72H   HYDROmorphone   Intravenous Q4H   metoprolol tartrate  12.5 mg Oral BID   pantoprazole  (PROTONIX) IV  40 mg Intravenous Q12H   sodium chloride flush  10-40 mL Intracatheter Q12H   Continuous Infusions:  lactated ringers 125 mL/hr at 10/02/21 0531   TPN ADULT (ION)       LOS: 1 day   The patient is critically ill with multiple organ systems failure and requires high complexity decision making for assessment and support, frequent evaluation and titration of therapies, application of advanced monitoring technologies and extensive interpretation of multiple databases. Critical Care Time devoted to patient care services described in this note  Time spent: 40 minutes     Colston Pyle, Geraldo Docker, MD [facility]   If 7PM-7AM, please contact night-coverage 10/02/2021, 12:26 PM

## 2021-10-02 NOTE — Progress Notes (Signed)
° ° ° °  Referral received for Ricardo [name] for ongoing pain management. Chart reviewed and updates received from RN. Patient assessed discussion was had with updates to pain regimen made.  See recommendations below. Full consult note to follow.   SUMMARY OF RECOMMENDATIONS   Increase PCA dosing as per below Will follow-up tomorrow on pain status May need to look at anxiolytic at some point after pain managed PMT will continue to follow   Symptom Management:  PCA Dilaudid: increase basal rate to 0.5 mg/hr, increase bolus dose to 1 mg q 15 min, max hourly dose 4.5 mg Continue Fentanyl patch 200 mcg/hr 72 hour patch  Thank you for your referral and allowing PMT to assist in [employer] care.   Walden Field, NP Palliative Medicine Team Phone: [phone]  NO CHARGE

## 2021-10-02 NOTE — Progress Notes (Signed)
Oncology Discharge Planning Admission Note  [facility] Address: [place], [place],  [address] Hours of Operation:  8am - 5pm, Monday - Friday  Clinic Contact Information:  [phone]  Oncology Care Team: Medical Oncologist:  Dr. Carmie End  Oncology provider Dr. Burr Medico is aware of this hospital admission dated 10/01/21, and the cancer center will follow Danae Chen inpatient care to assist with discharge planning as indicated by the oncologist.  Patient has been assessed at the bedside by Dr. Burr Medico. We will reach out to closer to discharge date to arrange follow up care.  Disclaimer:  This [place] note does not imply a formal consult request has been made by the admitting attending for this admission or there will be an inpatient consult completed by oncology.  Please request oncology consults as per standard process as indicated.

## 2021-10-02 NOTE — Progress Notes (Addendum)
HEMATOLOGY-ONCOLOGY PROGRESS NOTE  ASSESSMENT AND PLAN: 1.  Abdominal pain secondary to small bowel obstruction and peritoneal metastasis 2.  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  Nausea and vomiting 4.  Hypokalemia 5.  GERD 6.  Protein calorie malnutrition 7.  Mild neutropenia secondary to recent chemotherapy 8.  Goals of care discussion  -Abdominal pain significantly improved at this time with Dilaudid PCA.  Palliative care is following and assisting with pain medication management.  Appreciate their assistance. -The patient is not yet ready for hospice.  We are willing to give him 1 more cycle of chemotherapy.  PICC line has been placed.  Timing of chemo per Dr. Burr Medico.  -He has not yet started his home TPN.  PICC line has been placed.  TPN order set has been placed. -Goals of treatment are for palliation of symptoms.  He understands that his disease is not curable.  He agrees to limited code to include CPR and ACLS medications, but no defibrillation or cardioversion or mechanical ventilation.  Ricardo Bussing, DNP, AGPCNP-BC, AOCNP  SUBJECTIVE: Ricardo [name] reports improvement in his pain.  He is on Dilaudid PCA.  He is not having any nausea or vomiting today.  Had PICC line placed just prior to my visit.  Oncology History Overview Note  Cancer Staging metastatic cecal cancer Staging form: Colon and Rectum, AJCC 8th Edition - Clinical stage from 11/29/2020: Stage IVC (cTX, cN2, pM1c) - Signed by [name], MD on 12/04/2020 Stage prefix: Initial diagnosis Histologic grade (G): G3 Histologic grading system: 4 grade system    metastatic cecal cancer  11/27/2020 Imaging   CT Angio CAP  IMPRESSION: 1. Thickening of the terminal ileum with associated proximal mid to distal small bowel obstruction. Findings could be due to an ileitis versus malignancy. Nonspecific mesenteric edema could be due to engorgement versus metastases. No associated bowel perforation. Recommend  endoscopy for further evaluation. 2. Indeterminate right lower quadrant lymphadenopathy. 3. Scattered colonic diverticulosis with no acute diverticulitis. 4. Stable right hepatic lobe subcentimeter hyperdensity likely represents a hepatic hemangioma. 5. No acute vascular abnormality. Aortic Atherosclerosis (ICD10-I70.0) - mild. 6. No acute intrathoracic abnormality.   11/28/2020 Imaging   CT AP  IMPRESSION: 1. There is masslike, circumferential thickening of the terminal ileum and cecal base near the ileocecal valve and abnormally enlarged lymph nodes in the right lower quadrant mesentery adjacent to the terminal ileum measuring up to 2.3 x 1.4 cm. 2. There is extensive omental and peritoneal nodularity and caking throughout the abdomen. 3. Findings are highly concerning for primary colon malignancy with nodal and peritoneal metastatic disease. 4. Small volume perihepatic and perisplenic ascites. 5. The small bowel is generally decompressed, with full some fluid-filled, nondistended loops throughout. There is transit of oral enteric contrast to the terminal ileum. No evidence of overt bowel obstruction at this time. Esophagogastric tube is position with tip and side port below the diaphragm. 6. Atelectasis or consolidation of the dependent bilateral lung bases, new compared to prior examination.   Aortic Atherosclerosis (ICD10-I70.0).     11/29/2020 Surgery   EXPLORATORY LAPAROTOMY, PARTIAL BOWEL RESECTION, POSSIBLE OSTOMY CREATION, PERITIONEAL BIOPSY, INSERTION OF GASTROSTOMY TUBE by Dr [name]   11/29/2020 Initial Biopsy   FINAL MICROSCOPIC DIAGNOSIS:   AB. 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    COMMENT:   Immunohistochemistry (IHC) for CK20 and CDX-2 is strong and diffusely  positive.  CK7, TTF-1, Synaptophysin, Chromogranin and CD56  are  negative.  The immunophenotype is compatible with  origin from the lower  gastrointestinal tract.  IHC for MMR will be reported separately.  Case  preliminarily discussed with Dr. [name] on 12/02/2020.   At the request of Dr. [name], [phone] was reviewed in retrospect.  Review of the submitted sections confirms the presence of acute  appendicitis.  No malignancy is identified.    11/29/2020 Cancer Staging   Staging form: Colon and Rectum, AJCC 8th Edition - Clinical stage from 11/29/2020: Stage IVC (cTX, cN2, pM1c) - Signed by [name], MD on 12/04/2020 Stage prefix: Initial diagnosis Histologic grade (G): G3 Histologic grading system: 4 grade system   12/04/2020 Initial Diagnosis   Cancer of ascending colon metastatic to intra-abdominal lymph node ([place])    Chemotherapy   FOLFOX q2weeks    12/18/2020 - 02/15/2021 Chemotherapy      Patient is on Antibody Plan: COLORECTAL BEVACIZUMAB Q14D    01/03/2021 Imaging   CT A/P IMPRESSION: 1. Wall thickening of the terminal ileum again seen. Fluid-filled distal small bowel, ascending, and transverse colon without obstruction. 2. Equivocal improvement in omental and peritoneal metastatic disease with slightly improved small volume perihepatic and perisplenic ascites. Right lower quadrant mesenteric adenopathy is similar or mildly improved. 3. Sigmoid colonic diverticulosis. Mild mural wall thickening in the sigmoid, no acute diverticulitis. 4. Gastrostomy tube in place, balloon normally positioned in the stomach. 5. Chronic bilateral L5 pars interarticularis defects with trace anterolisthesis of L5 on S1. Chronic avascular necrosis of the left femoral head.   01/16/2021 Genetic Testing   Negative genetic testing. CTNNA1 [id number] VUS, RAD51D p.G140E VUS and TSC1 p.R768H VUS found on the CancerNext-Expanded+RNAinsight.  The CancerNext-Expanded gene panel offered by [facility] and includes sequencing and rearrangement analysis for the following 77 genes: AIP, ALK,  APC*, ATM*, AXIN2, BAP1, BARD1, BLM, BMPR1A, BRCA1*, BRCA2*, BRIP1*, CDC73, CDH1*, CDK4, CDKN1B, CDKN2A, CHEK2*, CTNNA1, DICER1, FANCC, FH, FLCN, GALNT12, KIF1B, LZTR1, MAX, MEN1, MET, MLH1*, MSH2*, MSH3, MSH6*, MUTYH*, NBN, NF1*, NF2, NTHL1, PALB2*, PHOX2B, PMS2*, POT1, PRKAR1A, PTCH1, PTEN*, RAD51C*, RAD51D*, RB1, RECQL, RET, SDHA, SDHAF2, SDHB, SDHC, SDHD, SMAD4, SMARCA4, SMARCB1, SMARCE1, STK11, SUFU, TMEM127, TP53*, TSC1, TSC2, VHL and XRCC2 (sequencing and deletion/duplication); EGFR, EGLN1, HOXB13, KIT, MITF, PDGFRA, POLD1, and POLE (sequencing only); EPCAM and GREM1 (deletion/duplication only). DNA and RNA analyses performed for * genes. The report date is Jan 16, 2021.   02/24/2021 Imaging   CT AP  IMPRESSION: 1. Interval resolution of previously seen small volume ascites throughout the abdomen and pelvis. There is redemonstrated, ill-defined stranding and thickening of the peritoneal surfaces and omentum, which is somewhat diminished compared to prior examination. 2. Interval improvement in right lower quadrant mesocolon lymph nodes. 3. Findings are consistent with treatment response of nodal and peritoneal metastatic disease. 4. Unchanged, matted appearing thickening of the terminal ileum and cecal base. 5. Sigmoid diverticulosis with wall thickening of the mid to distal sigmoid, similar to prior examination. Findings are most suggestive of chronic sequelae of diverticulitis.   Aortic Atherosclerosis (ICD10-I70.0).   03/13/2021 -  Chemotherapy   Patient is on Treatment Plan : COLORECTAL FOLFOXIRI + Bevacizumab q14d     05/13/2021 Procedure   OPERATIVE PROCEDURE: Laparoscopy and peritoneal biopsy.  SURGEON: Merlyn Albert. Clovis Riley, MD   05/13/2021 Pathology Results   Final Pathologic Diagnosis    A. PERITONEAL, BIOPSY :              Metastatic adenocarcinoma.  See comment.    Comment    The biopsies show predominately fibrosis  and fibroadipose tissue with a small focus  of moderately differentiated adenocarcinoma. Given the patient's history, the findings favor metastasis of the patient's known colonic cancer.     07/01/2021 Imaging   CT CAP  IMPRESSION: Chest Impression:   1. No evidence of thoracic metastasis.   Abdomen / Pelvis Impression:   1. Thickening through the terminal ileum similar to prior. No ileocecal lymphadenopathy. 2. One focus linear thickening the peritoneum adjacent to loops of small bowel in the ventral abdomen which extend to the proximal sigmoid colon are more prominent than prior and concerning for residual peritoneal carcinoma. 3. No evidence of solid organ metastasis in the abdomen pelvis.      REVIEW OF SYSTEMS:   Review of Systems  Constitutional:  Negative for chills and fever.  HENT: Negative.    Eyes: Negative.   Respiratory: Negative.    Cardiovascular: Negative.   Gastrointestinal:  Positive for abdominal pain. Negative for nausea and vomiting.       Abdomen pain well controlled at this time with Dilaudid PCA  Skin: Negative.   Neurological: Negative.   Endo/Heme/Allergies: Negative.   Psychiatric/Behavioral: Negative.     I have reviewed the past medical history, past surgical history, social history and family history with the patient and they are unchanged from previous note.   PHYSICAL EXAMINATION: ECOG PERFORMANCE STATUS: 2 - Symptomatic, <50% confined to bed  Vitals:   10/02/21 0549 10/02/21 0828  BP: (!) 127/91   Pulse: (!) 104   Resp: 17 16  Temp: 97.7 F (36.5 C)   SpO2: 97% 96%   Filed Weights   09/30/21 2035  Weight: 117 kg    Intake/Output from previous day: 01/18 0701 - 01/19 0700 In: 2080 [P.O.:1080; I.V.:1000] Out: 4098 [Urine:4000; Drains:1950]  Physical Exam Vitals reviewed.  Constitutional:      General: He is not in acute distress. HENT:     Head: Normocephalic.     Mouth/Throat:     Mouth: Mucous membranes are moist.     Pharynx: No oropharyngeal exudate.   Eyes:     General: No scleral icterus. Cardiovascular:     Rate and Rhythm: Normal rate and regular rhythm.     Pulses: Normal pulses.  Pulmonary:     Effort: Pulmonary effort is normal.     Breath sounds: Normal breath sounds.  Abdominal:     Comments: Mildly distended. No pain with palpation.   Skin:    General: Skin is warm and dry.  Neurological:     Mental Status: He is alert and oriented to person, place, and time.  Psychiatric:        Mood and Affect: Mood normal.        Behavior: Behavior normal.        Thought Content: Thought content normal.        Judgment: Judgment normal.    LABORATORY DATA:  I have reviewed the data as listed CMP Latest Ref Rng & Units 10/01/2021 09/30/2021 09/24/2021  Glucose 70 - 99 mg/dL 117(H) 113(H) 107(H)  BUN 6 - 20 mg/dL 29(H) 34(H) 21(H)  Creatinine 0.61 - 1.24 mg/dL 1.34(H) 1.12 0.78  Sodium 135 - 145 mmol/L 139 138 130(L)  Potassium 3.5 - 5.1 mmol/L 4.6 3.2(L) 4.0  Chloride 98 - 111 mmol/L 97(L) 97(L) 98  CO2 22 - 32 mmol/L 33(H) 31 27  Calcium 8.9 - 10.3 mg/dL 8.6(L) 8.5(L)  7.9(L)  Total Protein 6.5 - 8.1 g/dL 7.3 7.5 -  Total Bilirubin 0.3 - 1.2 mg/dL 1.4(H) 1.2 -  Alkaline Phos 38 - 126 U/L 111 120 -  AST 15 - 41 U/L 24 21 -  ALT 0 - 44 U/L 24 21 -    Lab Results  Component Value Date   WBC 2.8 (L) 10/01/2021   HGB 12.5 (L) 10/01/2021   HCT 39.7 10/01/2021   MCV 90.0 10/01/2021   PLT 271 10/01/2021   NEUTROABS 1.2 (L) 10/01/2021    Lab Results  Component Value Date   CEA1 1.9 11/29/2020   YME158 4 11/30/2020    X-ray chest PA and lateral  Result Date: 09/14/2021 CLINICAL DATA:  56 year old male with a history of colon cancer, recurrent SBO s/p venting gastrostomy tube, who presents to the emergency department today for evaluation of abdominal pain. Of note, patient recently admitted to the hospital for recurrent SBO and had a gastrostomy tube placed. He was actually discharged from the hospital yesterday. Since then  he states he has been draining his PEG tube and he continues to have a significant amount of output so he has not drained in several hours. He is now complaining of severe abdominal pain, distention, nausea vomiting. EXAM: CHEST - 2 VIEW COMPARISON:  01/04/2021. FINDINGS: Increased opacity at the left lung base compared to the prior exam, consistent with atelectasis, pneumonia or a combination. Remainder of the lungs is clear. Small left pleural effusion.  No pneumothorax. Cardiac silhouette normal in size. No mediastinal or hilar masses or evidence of adenopathy. Stable right internal jugular Port-A-Cath. Skeletal structures are grossly intact. IMPRESSION: 1. Left lower lung opacity consistent with a combination of atelectasis/pneumonia and a small effusion, new compared to the prior chest radiograph. Electronically Signed   By: Lajean Manes M.D.   On: 09/14/2021 10:59   Abd 1 View (KUB)  Result Date: 09/14/2021 CLINICAL DATA:  History of colon cancer with recurrent small bowel obstruction. Patient presents with abdominal pain. EXAM: ABDOMEN - 1 VIEW COMPARISON:  09/08/21 FINDINGS: Left upper quadrant gastrostomy tube identified. Since the previous exam there is been interval improvement in gaseous distension the of the small and large bowel loops. No new findings. IMPRESSION: Interval improvement in gaseous distension of the small and large bowel loops compared with 09/08/2021. Electronically Signed   By: Kerby Moors M.D.   On: 09/14/2021 10:57   DG Abd 1 View  Result Date: 09/08/2021 CLINICAL DATA:  Abdominal pain, nausea, vomiting EXAM: ABDOMEN - 1 VIEW COMPARISON:  09/07/2021 FINDINGS: Prominent small bowel loops centrally again noted, similar prior study compatible with persistent small bowel obstruction. No free air or organomegaly. Visualized lung bases clear. IMPRESSION: Continued small bowel obstruction pattern, not significantly changed. Electronically Signed   By: Rolm Baptise M.D.   On:  09/08/2021 15:12   DG Abd 1 View  Result Date: 09/07/2021 CLINICAL DATA:  Nausea and vomiting; EXAM: ABDOMEN - 1 VIEW COMPARISON:  September 04, 2021 ;September 03, 2021; September 02, 2021 FINDINGS: There are a few mildly dilated loops of bowel in the upper abdomen, improved in comparison to prior from September 03, 2021. Interval removal of enteric tube. Partial visualization of CVC tip terminating over the region of the superior cavoatrial junction. No bowel gas visualized in the rectum. IMPRESSION: Persistent mild dilation of several loops of small bowel in the upper abdomen, overall improved since September 03, 2021. This could reflect persistent small-bowel obstruction. Electronically Signed  By: Valentino Saxon M.D.   On: 09/07/2021 14:00   DG Abdomen 1 View  Result Date: 09/03/2021 CLINICAL DATA:  Nasogastric tube placement EXAM: ABDOMEN - 1 VIEW COMPARISON:  None. FINDINGS: Nasogastric tube tip overlies the expected mid body of the stomach. Multiple gas-filled dilated loops of small bowel are seen within the mid abdomen and left upper quadrant. No free intraperitoneal gas. IMPRESSION: Nasogastric tube tip within the mid body of the stomach. Electronically Signed   By: Fidela Salisbury M.D.   On: 09/03/2021 00:51   CT ABDOMEN PELVIS W CONTRAST  Result Date: 09/02/2021 CLINICAL DATA:  Bowel obstruction suspected. Abdominal pain. Chemotherapy. Three weeks ago. EXAM: CT ABDOMEN AND PELVIS WITH CONTRAST TECHNIQUE: Multidetector CT imaging of the abdomen and pelvis was performed using the standard protocol following bolus administration of intravenous contrast. CONTRAST:  65m OMNIPAQUE IOHEXOL 350 MG/ML SOLN COMPARISON:  CT abdomen and pelvis 08/27/2021. FINDINGS: Lower chest: There is a new trace left pleural effusion. There is atelectasis in the bilateral lung bases. Hepatobiliary: No focal liver abnormality is seen. No gallstones, gallbladder wall thickening, or biliary dilatation. Pancreas:  Unremarkable. No pancreatic ductal dilatation or surrounding inflammatory changes. Spleen: Normal in size without focal abnormality. Adrenals/Urinary Tract: Adrenal glands are unremarkable. Kidneys are normal, without renal calculi, focal lesion, or hydronephrosis. Bladder is unremarkable. Stomach/Bowel: There are dilated mid and proximal small bowel loops with air-fluid levels measuring up to 5 cm. Degree of distention has mildly increased. There is also new moderate air-fluid level within the stomach. Transition point is likely in the mid abdomen where there are multiple small bowel loops demonstrating adhesive disease to the anterior abdominal wall. This finding is unchanged. The colon is nondilated. The appendix is not seen. There is no pneumatosis or free air identified. Vascular/Lymphatic: Aortic atherosclerosis. No enlarged abdominal or pelvic lymph nodes. Nonenlarged cardiophrenic and ileocecal lymph nodes are unchanged. Reproductive: Prostate is unremarkable. Other: There are small fat containing bilateral inguinal hernias. There is a small amount of free fluid in the anterior right abdomen which has mildly increased. There is mesenteric/omental haziness in the left upper quadrant and anterior upper abdomen similar to the prior examination. Musculoskeletal: There are bilateral pars interarticularis defects at L5 without significant listhesis. IMPRESSION: 1. Again seen are findings of small-bowel obstruction. Transition point is likely in the mid abdomen where there are clustered small bowel loops adhered to the anterior abdominal wall. Can not exclude underlying obstructive lesion. Degree of distention has increased when compared to the prior exam. There is now air-fluid level in the stomach. 2. Increasing free fluid in the right abdomen. 3. New small left pleural effusion. 4. Stable diffuse omental haziness is unchanged, concerning for metastatic disease. Electronically Signed   By: ARonney AstersM.D.    On: 09/02/2021 20:30   IR GASTROSTOMY TUBE MOD SED  Result Date: 09/11/2021 CLINICAL DATA:  History of metastatic colon carcinoma with persistent small-bowel obstruction and request to place a venting gastrostomy tube for symptomatic relief. EXAM: PERCUTANEOUS GASTROSTOMY TUBE PLACEMENT ANESTHESIA/SEDATION: Moderate (conscious) sedation was employed during this procedure. A total of Versed 4.0 mg and Fentanyl 100 mcg was administered intravenously by radiology nursing. Moderate Sedation Time: 15 minutes. The patient's level of consciousness and vital signs were monitored continuously by radiology nursing throughout the procedure under my direct supervision. CONTRAST:  132mOMNIPAQUE IOHEXOL 300 MG/ML  SOLN MEDICATIONS: 2 g IV Ancef. IV antibiotic was administered in an appropriate time interval prior to needle puncture of the skin.  During the procedure the patient received 0.5 mg IV glucagon. FLUOROSCOPY TIME:  2 minutes and 24 seconds.  51.0 mGy. PROCEDURE: The procedure, risks, benefits, and alternatives were explained to the patient. Questions regarding the procedure were encouraged and answered. The patient understands and consents to the procedure. A time-out was performed prior to initiating the procedure. A 5-French catheter was advanced through the patient's mouth under fluoroscopy into the esophagus and to the level of the stomach. This catheter was used to insufflate the stomach with air under fluoroscopy. The abdominal wall was prepped with chlorhexidine in a sterile fashion, and a sterile drape was applied covering the operative field. A sterile gown and sterile gloves were used for the procedure. Local anesthesia was provided with 1% Lidocaine. A skin incision was made in the upper abdominal wall. Under fluoroscopy, an 18 gauge trocar needle was advanced into the stomach. Contrast injection was performed to confirm intraluminal position of the needle tip. A single T tack was then deployed in the  lumen of the stomach. This was brought up to tension at the skin surface. Over a guidewire, a 9-French sheath was advanced into the lumen of the stomach. The wire was left in place as a safety wire. A loop snare device from a percutaneous gastrostomy kit was then advanced into the stomach. A floppy guide wire was advanced through the orogastric catheter under fluoroscopy in the stomach. The loop snare advanced through the percutaneous gastric access was used to snare the guide wire. This allowed withdrawal of the loop snare out of the patient's mouth by retraction of the orogastric catheter and wire. A 20-French bumper retention gastrostomy tube was looped around the snare device. It was then pulled back through the patient's mouth. The retention bumper was brought up to the anterior gastric wall. The T tack suture was cut at the skin. The exiting gastrostomy tube was cut to appropriate length and a feeding adapter applied. The catheter was injected with contrast material to confirm position and a fluoroscopic spot image saved. The tube was then flushed with saline. A dressing was applied over the gastrostomy exit site. COMPLICATIONS: None. FINDINGS: The stomach distended well with air allowing safe placement of the gastrostomy tube. After placement, the tip of the gastrostomy tube lies in the body of the stomach. IMPRESSION: Percutaneous gastrostomy with placement of a 20-French bumper retention tube in the body of the stomach. Electronically Signed   By: Aletta Edouard M.D.   On: 09/11/2021 16:11   DG CHEST PORT 1 VIEW  Result Date: 09/18/2021 CLINICAL DATA:  Left PICC placement EXAM: PORTABLE CHEST 1 VIEW COMPARISON:  09/14/2021 FINDINGS: Single frontal view of the chest demonstrates stable right chest wall port. Left-sided PICC tip overlies the superior vena cava. Cardiac silhouette is stable. Stable left basilar consolidation. Small effusion is suspected. Right chest is clear. No pneumothorax. IMPRESSION:  1. Left-sided PICC tip overlying superior vena cava. 2. Continued left basilar consolidation and likely small effusion. Electronically Signed   By: Randa Ngo M.D.   On: 09/18/2021 17:32   DG ABD ACUTE 2+V W 1V CHEST  Result Date: 09/30/2021 CLINICAL DATA:  Abdominal pain. EXAM: DG ABDOMEN ACUTE WITH 1 VIEW CHEST COMPARISON:  Abdominal radiographs 09/14/2021 CT abdomen and pelvis 09/02/2021 FINDINGS: Large body habitus. Right chest wall porta catheter with tip overlying the superior vena cava/right atrial junction. Cardiac silhouette and mediastinal contours are within normal limits. Mild calcification within aortic arch. The lungs are clear. No pleural effusion or  pneumothorax. A gastrostomy tube again overlies the left upper quadrant. There are air-fluid levels within multiple loops of bowel on upright view. There may be mild distention of small-bowel loops measuring up to approximately 4 cm in caliber No portal venous gas or pneumatosis is seen. No subdiaphragmatic free air on upright view. IMPRESSION: There are air-fluid levels throughout the small bowel. There is likely dilatation of some small bowel loops at least 4 cm. Note is made of small bowel obstruction with similar small bowel dilatation due to adhesions to the anterior abdominal wall seen on prior CT. Gastrostomy tube is again noted. Electronically Signed   By: Yvonne Kendall M.D.   On: 09/30/2021 09:29   DG Abd Portable 1V-Small Bowel Obstruction Protocol-24 hr delay  Result Date: 09/04/2021 CLINICAL DATA:  Small-bowel obstruction. Bowel obstruction protocol/24 delay image. EXAM: PORTABLE ABDOMEN - 1 VIEW COMPARISON:  Radiographs 09/03/2021 and 08/27/2021.  CT 09/02/2021. FINDINGS: 1120 hours. Two views obtained. Tip of the nasogastric tube projects over the distal stomach. The enteric contrast has passed into the colon which appears decompressed. Previously noted small bowel distension is improved. No extraluminal contrast or air  collections are identified. Telemetry leads overlie the chest and upper abdomen. IMPRESSION: Antegrade passage of contrast into decompressed colon with improved small bowel dilatation. No evidence of bowel obstruction or perforation. Electronically Signed   By: Richardean Sale M.D.   On: 09/04/2021 13:43   DG Abd Portable 1V-Small Bowel Obstruction Protocol-initial, 8 hr delay  Result Date: 09/03/2021 CLINICAL DATA:  Small-bowel obstruction. EXAM: PORTABLE ABDOMEN - 1 VIEW COMPARISON:  09/03/2021 FINDINGS: Nasogastric tube overlies expected mid body of the stomach. Contrast is seen within the gastric fundus. There has been little antegrade passage of contrast into the remainder of the abdomen. Multiple loops of gas-filled dilated small bowel are seen within the epigastrium and left abdomen in keeping with changes of a mid small bowel obstruction. No gross free intraperitoneal gas. No organomegaly. IMPRESSION: No significant antegrade passage of contrast from the gastric lumen. Persistent findings in keeping with a mid small bowel obstruction. Electronically Signed   By: Fidela Salisbury M.D.   On: 09/03/2021 19:48   ECHOCARDIOGRAM COMPLETE  Result Date: 10/01/2021    ECHOCARDIOGRAM REPORT   Patient Name:   Ricardo [name] Web Date of Exam: 10/01/2021 Medical Rec #:  [id number]       Height:       71.0 in Accession #:    [id number]      Weight:       257.9 lb Date of Birth:  1965/12/26        BSA:          2.349 m Patient Age:    56 years        BP:           132/79 mmHg Patient Gender: M               HR:           112 bpm. Exam Location:  Inpatient Procedure: 2D Echo, Cardiac Doppler, Color Doppler and Intracardiac            Opacification Agent Indications:    Abnormal ECG R94.31  History:        Patient has no prior history of Echocardiogram examinations.                 Risk Factors:Dyslipidemia. GERD.  Sonographer:    Darlina Sicilian RDCS Referring Phys: [phone] Tees Toh  ORTIZ IMPRESSIONS  1. Left  ventricular ejection fraction, by estimation, is 65 to 70%. The left ventricle has normal function. The left ventricle has no regional wall motion abnormalities. Left ventricular diastolic parameters are consistent with Grade I diastolic dysfunction (impaired relaxation).  2. Right ventricular systolic function is normal. The right ventricular size is normal.  3. The mitral valve is normal in structure. No evidence of mitral valve regurgitation. No evidence of mitral stenosis.  4. The aortic valve is normal in structure. Aortic valve regurgitation is not visualized. No aortic stenosis is present.  5. The inferior vena cava is normal in size with greater than 50% respiratory variability, suggesting right atrial pressure of 3 mmHg. FINDINGS  Left Ventricle: Left ventricular ejection fraction, by estimation, is 65 to 70%. The left ventricle has normal function. The left ventricle has no regional wall motion abnormalities. Definity contrast agent was given IV to delineate the left ventricular  endocardial borders. The left ventricular internal cavity size was normal in size. There is no left ventricular hypertrophy. Left ventricular diastolic parameters are consistent with Grade I diastolic dysfunction (impaired relaxation). Normal left ventricular filling pressure. Right Ventricle: The right ventricular size is normal. No increase in right ventricular wall thickness. Right ventricular systolic function is normal. Left Atrium: Left atrial size was normal in size. Right Atrium: Right atrial size was normal in size. Pericardium: There is no evidence of pericardial effusion. Mitral Valve: The mitral valve is normal in structure. No evidence of mitral valve regurgitation. No evidence of mitral valve stenosis. Tricuspid Valve: The tricuspid valve is normal in structure. Tricuspid valve regurgitation is not demonstrated. No evidence of tricuspid stenosis. Aortic Valve: The aortic valve is normal in structure. Aortic valve  regurgitation is not visualized. No aortic stenosis is present. Pulmonic Valve: The pulmonic valve was normal in structure. Pulmonic valve regurgitation is not visualized. No evidence of pulmonic stenosis. Aorta: The aortic root is normal in size and structure. Venous: The inferior vena cava is normal in size with greater than 50% respiratory variability, suggesting right atrial pressure of 3 mmHg. IAS/Shunts: No atrial level shunt detected by color flow Doppler.  LEFT VENTRICLE PLAX 2D LVIDd:         5.00 cm   Diastology LVIDs:         2.80 cm   LV e' medial:    6.53 cm/s LV PW:         0.90 cm   LV E/e' medial:  7.0 LV IVS:        1.00 cm   LV e' lateral:   6.74 cm/s LVOT diam:     2.80 cm   LV E/e' lateral: 6.8 LV SV:         45 LV SV Index:   19 LVOT Area:     6.16 cm  RIGHT VENTRICLE RV S prime:     9.14 cm/s TAPSE (M-mode): 1.5 cm LEFT ATRIUM         Index LA diam:    2.50 cm 1.06 cm/m  AORTIC VALVE LVOT Vmax:   49.00 cm/s LVOT Vmean:  30.900 cm/s LVOT VTI:    0.073 m  AORTA Ao Root diam: 3.80 cm Ao Asc diam:  3.50 cm MITRAL VALVE MV Area (PHT): 3.58 cm    SHUNTS MV Decel Time: 212 msec    Systemic VTI:  0.07 m MV E velocity: 45.80 cm/s  Systemic Diam: 2.80 cm MV A velocity: 60.00 cm/s MV E/A ratio:  0.76  Skeet Latch MD Electronically signed by Skeet Latch MD Signature Date/Time: 10/01/2021/12:05:12 PM    Final    [place] EKG SITE RITE  Result Date: 09/30/2021 If Site Rite image not attached, placement could not be confirmed due to current cardiac rhythm.  [place] EKG Site Rite  Result Date: 09/18/2021 If Site Rite image not attached, placement could not be confirmed due to current cardiac rhythm.  [place] EKG SITE RITE  Result Date: 09/18/2021 If Site Rite image not attached, placement could not be confirmed due to current cardiac rhythm.    No future appointments.     LOS: 1 day    Addendum  I have seen the patient, examined him. I agree with the assessment and and plan and have edited the  notes.   Ricardo [name] was quite drowsy when I saw him in late afternoon, from increased pain meds, and with hallucinations. Wife at bed side, palliative team has increased his dialaudid PCA and fentanyl patch. PICC line was placed today, and his port is now working. Will see how he is doing tomorrow, will hold chemo when his pain is not controlled. Wife agrees.   [name]  10/02/2021

## 2021-10-02 NOTE — Plan of Care (Signed)
Plan of care reviewed and discussed with the patient and family. 

## 2021-10-02 NOTE — Progress Notes (Signed)
Palliative Care following for pain and symptom management. He is currently on a hydromorphone PCA-I plan to discuss option of home PCA vs. Methadone but his oral route and GI absorption may be challenging.

## 2021-10-02 NOTE — Plan of Care (Signed)
  Problem: Activity: Goal: Risk for activity intolerance will decrease Outcome: Progressing   Problem: Pain Managment: Goal: General experience of comfort will improve Outcome: Progressing   Problem: Safety: Goal: Ability to remain free from injury will improve Outcome: Progressing   

## 2021-10-02 NOTE — Progress Notes (Signed)
PHARMACY - TOTAL PARENTERAL NUTRITION CONSULT NOTE   Indication: Small bowel obstruction  Patient Measurements: Height: 5\' 11"  (180.3 cm) Weight: 117 kg (257 lb 15 oz) IBW/kg (Calculated) : 75.3 TPN AdjBW (KG): 85.7 Body mass index is 35.98 kg/m. Usual Weight: Wt has fluctuated between 104-120 kg since 2020  Assessment: 34 yoM with PMH metastatic colon cancer with peritoneal carcinomatosis with recent admissions for SBO readmitted 1/17 for same. Venting PEG recently placed, but continues to have abdominal pain despite draining collection bag 3-4x daily. Ordered TPN at home but sounds like this has not actually started yet; unable to access home TPN orders/Hx at this time. Pharmacy to continue TPN therapy while admitted. PICC line palced 1/19 d/t difficulty accessing implanted chemo port.  Glucose: SBGs stable WNL prior to starting TPN Electrolytes: All rising in tandem with worsening SCr; Mg and Phos now borderline high Renal: SCr rising since previous admission; BUN elevated but trending down; bicarb elevated and rising; UOP remains excellent Hepatic: Tbili slightly elevated; Albumin low; otherwise LFTs WNL; TG pending I/O: ~2L daily from venting PEG - MIVF: LR at 125  GI Imaging: - 1/17: persistent SBO GI Surgeries / Procedures:  - Venting PEG placed 12/29  Central access: PICC placed 1/19 in IR TPN start date: 1/19  Nutritional Goals:  f/u RD recs and home TPN orders   Plan:  Without knowing prior TPN regimen, or if TPN had even been initiated, will start TPN here as though new start Start TPN at 40 mL/hr at 1800 Electrolytes in TPN: remove everything except Na and Ca with SCr/lytes rising Na - 50 mEq/L K - 0 mEq/L Ca - 5 mEq/L Mg - 0 mEq/L Phos - 0 mmol/L Cl:Ac ratio - Max Cl Add standard MVI and trace elements to TPN Initiate Sensitive q8h SSI and adjust as needed  Reduce MIVF to 75 mL/hr at 1800 Monitor TPN labs on Mon/Thurs Bmet, Mg, Phos tomorrow  Reuel Boom, PharmD, BCPS [phone] 10/02/2021, 4:00 PM

## 2021-10-02 NOTE — Consult Note (Signed)
Palliative Care Consult Note                                  Date: 10/02/2021   Patient Name: Ricardo [name]  DOB: [birth year]  MRN: [id number]  Age / Sex: 56 y.o., male  PCP: [name], MD Referring Physician: Allie Bossier, MD  Reason for Consultation: Pain control  HPI/Patient Profile: 56 y.o. male  with past medical history of osteoarthritis, class II obesity, generalized anxiety disorder, hyperlipidemia, unspecified sleep disorder, GERD, colon cancer with metastasis and peritoneal carcinomatosis who was recently admitted and discharged yesterday due to SBO requiring PEG placement for venting purposes    Again returns to the hospital due to recurrence of abdominal pain, multiple episodes of nausea, emesis, despite draining the PEG tube collecting bag 3-4 times yesterday similar to while he experience when I admitted him on the first of the month.  PMT was consulted for pain management.  Past Medical History:  Diagnosis Date   Arthritis    Class 2 obesity 09/14/2021   Colon cancer [facility])    with metastasis   Family history of adverse reaction to anesthesia    mother had problem with it due to her asthma   Family history of breast cancer    Family history of prostate cancer    GAD (generalized anxiety disorder) 09/03/2021   GERD (gastroesophageal reflux disease) 09/03/2021   History of COVID-19 04/30/2021   Formatting of this note might be different from the original. 01/2021, asymptomatic   Hyperlipidemia 11/27/2020   Pleural effusion, left 09/06/2021   Sleep disorder 11/10/2018    Subjective:   This NP Ricardo [name] reviewed medical records, received report from team, assessed the patient and then meet at the patient's bedside to discuss diagnosis, prognosis, GOC, EOL wishes disposition and options.  I met with the patient at the bedside.   Concept of Palliative Care was introduced as specialized medical care for people  and their families living with serious illness.  If focuses on providing relief from the symptoms and stress of a serious illness.  The goal is to improve quality of life for both the patient and the family. Values and goals of care important to patient and family were attempted to be elicited.  Created space and opportunity for patient  and family to explore thoughts and feelings regarding current medical situation   Natural trajectory and current clinical status were discussed. Questions and concerns addressed. Patient  encouraged to call with questions or concerns.    Goals: Per previous notes patient not ready for hospice yet, elected limited code. Oncology offering another round of chemo for palliative purposes.  Today's Discussion: I met with the patient who is currently on PCA.  At that time he was having significant and sudden exacerbation of his pain.  We discussed options including increasing his PCA dosing parameters.  His fentanyl patch started increased to 200 mcg/h.  Try to make it clear that "I am not drug-seeking, it is just really hurting."  Reassured him that he has a legitimate disease with legitimate complex pain given his cancer diagnosis.  Provided  emotional general support through therapeutic listening and reassurance.  Addressed all questions answered all concerns to the best my ability.  Review of Systems  Gastrointestinal:  Positive for abdominal pain. Negative for nausea and vomiting.   Objective:   Primary Diagnoses: Present on Admission:  Intractable abdominal pain  GERD (gastroesophageal reflux disease)  Class 2 obesity  Colon cancer metastasized to multiple sites ([place])  Nausea and vomiting  Leukopenia  Hypokalemia   Physical Exam Vitals and nursing note reviewed.  Constitutional:      General: He is in acute distress (acute exacerbation of pain).     Appearance: He is ill-appearing.  Pulmonary:     Effort: Pulmonary effort is normal. No respiratory  distress.  Neurological:     General: No focal deficit present.     Mental Status: He is alert.    Vital Signs:  BP (!) 137/92 (BP Location: Right Arm)    Pulse (!) 105    Temp 97.7 F (36.5 C) (Oral)    Resp 18    Ht 5' 11" (1.803 m)    Wt 117 kg    SpO2 95%    BMI 35.98 kg/m   Palliative Assessment/Data noted20%    Advanced Care Planning:   Primary Decision Maker: PATIENT  Code Status/Advance Care Planning: Limited code  Decisions/Changes to ACP: None today  Assessment & Plan:   Impression: 56 year old male with abdominal pain secondary to small bowel obstruction and peritoneal metastasis from metastatic colon cancer and carcinomatosis.  Presented with nausea and vomiting which seems to be better controlled.  He is expressed he is not ready for hospice, oncology on board with 1 more cycle of chemotherapy and plan to continue TPN.  Adjustments made to PCA for improved pain management.  Overall long-term prognosis poor.  SUMMARY OF RECOMMENDATIONS   Increase PCA dosing as per below Will follow-up tomorrow on pain status May need to look at anxiolytic at some point after pain managed PMT will continue to follow  Symptom Management:  PCA Dilaudid: increase basal rate to 0.5 mg/hr, increase bolus dose to 1 mg q 15 min, max hourly dose 4.5 mg Continue Fentanyl patch 200 mcg/hr 72 hour patch  Prognosis:  Unable to determine  Discharge Planning:  To Be Determined   Discussed with: Medical team, nursing team, patient    Thank you for allowing [place] to participate in the care of Ricardo [name] PMT will continue to support holistically.  Time Total: 75 min  Greater than 50%  of this time was spent counseling and coordinating care related to the above assessment and plan.  Signed by: Ricardo Field, NP Palliative Medicine Team  Team Phone # [phone] (Nights/Weekends)  10/02/2021, 3:12 PM

## 2021-10-03 ENCOUNTER — Encounter (HOSPITAL_COMMUNITY): Payer: Self-pay

## 2021-10-03 DIAGNOSIS — C189 Malignant neoplasm of colon, unspecified: Secondary | ICD-10-CM | POA: Diagnosis not present

## 2021-10-03 DIAGNOSIS — G893 Neoplasm related pain (acute) (chronic): Secondary | ICD-10-CM

## 2021-10-03 DIAGNOSIS — Z515 Encounter for palliative care: Secondary | ICD-10-CM | POA: Diagnosis not present

## 2021-10-03 DIAGNOSIS — K2100 Gastro-esophageal reflux disease with esophagitis, without bleeding: Secondary | ICD-10-CM | POA: Diagnosis not present

## 2021-10-03 DIAGNOSIS — Z7189 Other specified counseling: Secondary | ICD-10-CM | POA: Diagnosis not present

## 2021-10-03 DIAGNOSIS — E669 Obesity, unspecified: Secondary | ICD-10-CM | POA: Diagnosis not present

## 2021-10-03 LAB — GLUCOSE, CAPILLARY
Glucose-Capillary: 148 mg/dL — ABNORMAL HIGH (ref 70–99)
Glucose-Capillary: 128 mg/dL — ABNORMAL HIGH (ref 70–99)
Glucose-Capillary: 143 mg/dL — ABNORMAL HIGH (ref 70–99)

## 2021-10-03 LAB — CBC WITH DIFFERENTIAL/PLATELET
Abs Immature Granulocytes: 0.7 10*3/uL — ABNORMAL HIGH (ref 0.00–0.07)
Basophils Absolute: 0 10*3/uL (ref 0.0–0.1)
Basophils Relative: 0 %
Eosinophils Absolute: 0 10*3/uL (ref 0.0–0.5)
Eosinophils Relative: 0 %
HCT: 40.7 % (ref 39.0–52.0)
Hemoglobin: 13.2 g/dL (ref 13.0–17.0)
Immature Granulocytes: 10 %
Lymphocytes Relative: 13 %
Lymphs Abs: 0.9 10*3/uL (ref 0.7–4.0)
MCH: 28.1 pg (ref 26.0–34.0)
MCHC: 32.4 g/dL (ref 30.0–36.0)
MCV: 86.6 fL (ref 80.0–100.0)
Monocytes Absolute: 1.3 10*3/uL — ABNORMAL HIGH (ref 0.1–1.0)
Monocytes Relative: 18 %
Neutro Abs: 4.1 10*3/uL (ref 1.7–7.7)
Neutrophils Relative %: 59 %
Platelets: 350 10*3/uL (ref 150–400)
RBC: 4.7 MIL/uL (ref 4.22–5.81)
RDW: 14.9 % (ref 11.5–15.5)
WBC: 7 10*3/uL (ref 4.0–10.5)
nRBC: 0 % (ref 0.0–0.2)

## 2021-10-03 LAB — TRIGLYCERIDES: Triglycerides: 129 mg/dL (ref <150)

## 2021-10-03 LAB — COMPREHENSIVE METABOLIC PANEL
ALT: 21 U/L (ref 0–44)
AST: 23 U/L (ref 15–41)
Albumin: 2.6 g/dL — ABNORMAL LOW (ref 3.5–5.0)
Alkaline Phosphatase: 98 U/L (ref 38–126)
Anion gap: 8 (ref 5–15)
BUN: 28 mg/dL — ABNORMAL HIGH (ref 6–20)
CO2: 33 mmol/L — ABNORMAL HIGH (ref 22–32)
Calcium: 8.5 mg/dL — ABNORMAL LOW (ref 8.9–10.3)
Chloride: 93 mmol/L — ABNORMAL LOW (ref 98–111)
Creatinine, Ser: 1.32 mg/dL — ABNORMAL HIGH (ref 0.61–1.24)
GFR, Estimated: >60 mL/min (ref >60)
Glucose, Bld: 146 mg/dL — ABNORMAL HIGH (ref 70–99)
Potassium: 3.8 mmol/L (ref 3.5–5.1)
Sodium: 134 mmol/L — ABNORMAL LOW (ref 135–145)
Total Bilirubin: 1.3 mg/dL — ABNORMAL HIGH (ref 0.3–1.2)
Total Protein: 7.5 g/dL (ref 6.5–8.1)

## 2021-10-03 LAB — PHOSPHORUS: Phosphorus: 4 mg/dL (ref 2.5–4.6)

## 2021-10-03 LAB — MAGNESIUM: Magnesium: 2.3 mg/dL (ref 1.7–2.4)

## 2021-10-03 MED ORDER — ENOXAPARIN SODIUM 60 MG/0.6ML IJ SOSY
50.0000 mg | PREFILLED_SYRINGE | INTRAMUSCULAR | Status: DC
  Administered 2021-10-03 – 2021-10-13 (×11): 50 mg via SUBCUTANEOUS
  Filled 2021-10-03 (×11): qty 0.6

## 2021-10-03 MED ORDER — TRAVASOL 10 % IV SOLN
INTRAVENOUS | Status: AC
  Filled 2021-10-03: qty 792

## 2021-10-03 MED ORDER — LACTATED RINGERS IV SOLN: INTRAVENOUS | Status: AC

## 2021-10-03 NOTE — Plan of Care (Signed)
°  Problem: Clinical Measurements: Goal: Ability to maintain clinical measurements within normal limits will improve Outcome: Progressing   Problem: Elimination: Goal: Will not experience complications related to bowel motility Outcome: Progressing   Problem: Nutrition: Goal: Adequate nutrition will be maintained Outcome: Progressing   Problem: Coping: Goal: Level of anxiety will decrease Outcome: Progressing   Problem: Pain Managment: Goal: General experience of comfort will improve Outcome: Progressing

## 2021-10-03 NOTE — Plan of Care (Signed)
°  Problem: Education: Goal: Knowledge of General Education information will improve Description: Including pain rating scale, medication(s)/side effects and non-pharmacologic comfort measures Outcome: Progressing   Problem: Clinical Measurements: Goal: Ability to maintain clinical measurements within normal limits will improve Outcome: Progressing   Problem: Nutrition: Goal: Adequate nutrition will be maintained Outcome: Progressing   Problem: Coping: Goal: Level of anxiety will decrease Outcome: Progressing   Problem: Pain Managment: Goal: General experience of comfort will improve Outcome: Progressing   

## 2021-10-03 NOTE — Progress Notes (Signed)
PHARMACY - TOTAL PARENTERAL NUTRITION CONSULT NOTE   Indication: Small bowel obstruction  Patient Measurements: Height: 5\' 11"  (180.3 cm) Weight: 102.7 kg (226 lb 6.4 oz) IBW/kg (Calculated) : 75.3 TPN AdjBW (KG): 85.7 Body mass index is 31.58 kg/m. Usual Weight: Wt has fluctuated between 104-120 kg since 2020  Assessment: 74 yoM with PMH metastatic colon cancer with peritoneal carcinomatosis with recent admissions for SBO readmitted 1/17 for same. Venting PEG recently placed, but continues to have abdominal pain despite draining collection bag 3-4x daily. Ordered TPN at home but sounds like this has not actually started yet; unable to access home TPN orders/Hx at this time. Pharmacy to continue TPN therapy while admitted. PICC line palced 1/19 d/t difficulty accessing implanted chemo port.  Glucose:  No hx DM. Hgb A1c 5.5% on 09/14/21 - CBG range 124-148, 2 units SSI required - On dexamethasone 4mg  IV q24h Electrolytes: Na slightly low, K wnl but decreased from yesterday, Cl low, CO2 elevated, Corr Ca wnl (9.62) Renal: SCr rising since previous admission; BUN elevated but trending down; bicarb elevated and rising; UOP remains excellent Hepatic: Tbili slightly elevated; Albumin low; otherwise LFTs WNL;  TG: wnl (129 on 1/20) I/O: ~2L daily from venting PEG - UOP 1630ml, 3 occurrences  - MIVF: LR at 75 ml/hr GI Imaging: - 1/17: persistent SBO GI Surgeries / Procedures:  - Venting PEG placed 12/29  Central access: double lumen PICC in L brachial vein placed 1/19 in IR TPN start date: 1/19  Nutritional Goals:   On 09/22/2021:  Total Energy Estimated Needs: 2200-2400 kcal Total Protein Estimated Needs: 115-125 grams Total Fluid Estimated Needs: >/= 2.4 L/day  For now, TPN at goal rate 40ml/hr will provide 119g protein, 2224kcal per day.  Will f/u RD recs this admission   Plan:  Increase TPN to 60 mL/hr at 1800 This will provide 79g protein, 1483kcal Electrolytes in TPN:  Na -  75 mEq/L (increase) K - 10 mEq/L (increase) Ca - 5 mEq/L Mg - 0 mEq/L Phos - 0 mmol/L Cl:Ac ratio - Max Cl Add standard MVI and trace elements to TPN Initiate Sensitive q8h SSI and adjust as needed  Reduce MIVF to 55 mL/hr at 1800 Monitor TPN labs on Mon/Thurs CMET, Mg, Phos tomorrow  Peggyann Juba, PharmD, BCPS Pharmacy: [phone] 10/03/2021, 7:08 AM

## 2021-10-03 NOTE — Progress Notes (Signed)
Initial Nutrition Assessment  DOCUMENTATION CODES:   Obesity unspecified  INTERVENTION:  - TPN per Pharmacist. - will monitor for possible diet advancement during hospitalization.   NUTRITION DIAGNOSIS:   Increased nutrient needs related to chronic illness, cancer and cancer related treatments as evidenced by estimated needs.  GOAL:   Patient will meet greater than or equal to 90% of their needs  MONITOR:   PO intake, Diet advancement, Labs, Weight trends, I & O's, Other (Comment) (TPN regimen)  REASON FOR ASSESSMENT:   Consult New TPN/TNA  ASSESSMENT:   56 y.o. male with medical history of osteoarthritis, obesity, generalized anxiety disorder, HLD, unspecified sleep disorder, GERD, colon cancer with metastasis and peritoneal carcinomatosis. He was recently admitted for SBO requiring vent PEG placement. He returned to the ED due to abdominal pain and multiple episodes of N/V.  Patient laying in bed with no visitors present at the time of RD visit. Patient is on a CLD. He shares that he would like to confirm with Dr. Burr Medico but would like to have jello and applesauce; RD able to send secure chat message to Dr. Burr Medico who confirms jello and limited amount of applesauce would be ok.  Patient reports that he received 4 of 5 days of TPN at home. He began to drink sip of water/ice water at home since last hospitalization and that went well so began to take sips of Pedialyte which he has also tolerated well.   Patient has R chest port and double lumen PICC in R brachial. He is receiving TPN through PICC and it was started at 40 ml/hr yesterday with order to increase to 60 ml/hr today.   Pharmacist's note from this AM states planned goal rate of 90 ml/hr to provide 2224 kcal (94% kcal need) and 119 grams protein (100% protein need).  Patient with vent PEG. Patient with good understanding of how to utilize and demonstrates opening and closing valve with sips of water.  Weight was stable  06/19/21-09/14/21 and is now -32 lb since 09/14/21. Will monitor closely.   Patient denies difficulty with ambulation.     Labs reviewed; CBG: 148 mg/dl, Na: 134 mmol/l, Cl: 93 mmol/l, BUN: 28 mg/dl, creatinine: 1.32 mg/dl, Ca: 8.5 mg/dl.  Medications reviewed; sliding scale novolog, 40 mg IV protonix BID.  IVF; LR @ 75 ml/hr.    NUTRITION - FOCUSED PHYSICAL EXAM:  Flowsheet Row Most Recent Value  Orbital Region No depletion  Upper Arm Region No depletion  Thoracic and Lumbar Region Unable to assess  Buccal Region No depletion  Temple Region No depletion  Clavicle Bone Region Mild depletion  Clavicle and Acromion Bone Region Mild depletion  Scapular Bone Region Mild depletion  Dorsal Hand No depletion  Patellar Region No depletion  Anterior Thigh Region No depletion  Posterior Calf Region Mild depletion  Edema (RD Assessment) None  Hair Reviewed  Eyes Reviewed  Mouth Reviewed  Skin Reviewed  Nails Reviewed       Diet Order:   Diet Order             Diet clear liquid Room service appropriate? Yes; Fluid consistency: Thin  Diet effective now                   EDUCATION NEEDS:   Education needs have been addressed  Skin:  Skin Assessment: Reviewed RN Assessment  Last BM:  PTA/unknown  Height:   Ht Readings from Last 1 Encounters:  09/30/21 5\' 11"  (1.803 m)  Weight:   Wt Readings from Last 1 Encounters:  10/03/21 102.7 kg    Estimated Nutritional Needs:  Kcal:  2365-2600 kcal Protein:  120-135 grams Fluid:  >/= 2.5 L/day     Jarome Matin, MS, RD, LDN Inpatient Clinical Dietitian RD pager # available in [place]  After hours/weekend pager # available in [facility]

## 2021-10-03 NOTE — Progress Notes (Signed)
Daily Progress Note   Patient Name: Ricardo [name]       Date: 10/03/2021 DOB: 1965/10/06  Age: 56 y.o. MRN#: [id number] Attending Physician: Allie Bossier, MD Primary Care Physician: [name], MD Admit Date: 09/30/2021 Length of Stay: 2 days  Reason for Consultation/Follow-up: Establishing goals of care  HPI/Patient Profile:  56 y.o. male  with past medical history of osteoarthritis, class II obesity, generalized anxiety disorder, hyperlipidemia, unspecified sleep disorder, GERD, colon cancer with metastasis and peritoneal carcinomatosis who was recently admitted and discharged yesterday due to SBO requiring PEG placement for venting purposes    Again returns to the hospital due to recurrence of abdominal pain, multiple episodes of nausea, emesis, despite draining the PEG tube collecting bag 3-4 times yesterday similar to while he experience when I admitted him on the first of the month.   PMT was consulted for pain management.  Subjective:   Subjective: Chart Reviewed. Updates received. Patient Assessed. Created space and opportunity for patient  and family to explore thoughts and feelings regarding current medical situation.  Today's Discussion: Met with the patient at bedside today.  He states his pain is much better controlled.  Denies any overt nausea or vomiting.  Seems much less anxious today.  We discussed the plan for ensuring that his pain is well managed and then transitioning to a home appropriate regimen to allow him to follow-up with his oncologist for another cycle of chemotherapy that has been offered.  He verbalized understanding.  He thanked [place] for our help in getting his pain better managed.  Provided emotional general support and therapeutic listening, empathy and other techniques.  I answered all questions addressed all concerns to the best my ability.  I interrogated his PCA pump which shows 23.02 mg dilaudid over 24 hrs (18 boluses requested, 16 received).  In the last 12 hours received 10.92 mg dilaudid (5 boluses requested, 5 received). Appears fewer requested boluses more recently and less overall medication indicating improvement. Current regimen is basal rate 0.5m/hr and bolus dose 1 mg q 15 mins.  Review of Systems  Respiratory:  Negative for chest tightness.   Cardiovascular:  Negative for chest pain.  Gastrointestinal:  Positive for abdominal pain (improved). Negative for nausea and vomiting.   Objective:   Vital Signs:  BP 134/79 (BP Location: Right Arm)    Pulse 70    Temp 97.7 F (36.5 C) (Oral)    Resp 12    Ht '5\' 11"'  (1.803 m)    Wt 102.7 kg    SpO2 98%    BMI 31.58 kg/m   Physical Exam: Physical Exam Vitals and nursing note reviewed.  Constitutional:      General: He is not in acute distress.    Appearance: He is ill-appearing.  HENT:     Head: Normocephalic and atraumatic.  Cardiovascular:     Rate and Rhythm: Normal rate.  Pulmonary:     Effort: No respiratory distress.     Breath sounds: No wheezing or rhonchi.  Skin:    General: Skin is warm and dry.  Neurological:     General: No focal deficit present.     Mental Status: He is alert.  Psychiatric:        Mood and Affect: Mood normal.        Behavior: Behavior normal.    Palliative Assessment/Data: 40-50%   Assessment & Plan:   Impression: Present on Admission:  Intractable abdominal pain  GERD (gastroesophageal reflux disease)  Class 2 obesity  Colon cancer metastasized to multiple sites ([place])  Nausea and vomiting  Leukopenia  Hypokalemia  56 year old male with abdominal pain secondary to small bowel obstruction and peritoneal metastasis from metastatic colon cancer and carcinomatosis.  Presented with nausea and vomiting which seems to be better controlled.  He is expressed he is not ready for hospice, oncology on board with 1 more cycle of chemotherapy and plan to continue TPN.  Adjustments made to PCA for improved pain management, pain appears to  be better managed today.  Overall long-term prognosis poor.  SUMMARY OF RECOMMENDATIONS   Continue same PCA dosing as per below Follow-up tomorrow for pain check May be able to start transition to OP appropriate regimen tomorrow PMT will continue to follow  Symptom Management:  Continue Dilaudid: Basal rate of 0.5 mg/h, bolus dose 1 mg every 15 minutes as needed, max hourly dose 4.5 mg/hr Continue Fentanyl patch 200 mcg/hr (72 hour patch)  Code Status: Limited code  Prognosis: Unable to determine  Discharge Planning: To Be Determined  Discussed with: Patient, medical team, nursing team  Thank you for allowing [place] to participate in the care of Ricardo [name] PMT will continue to support holistically.  MDM: Acute or chronic illness posing threat to life or bodily function, discussion of management plan with medical team and nursing team, ongoing management of parenteral opioids.  Walden Field, NP Palliative Medicine Team  Team Phone # [phone] (Nights/Weekends)  05/13/2021, 8:17 AM

## 2021-10-03 NOTE — Progress Notes (Addendum)
HEMATOLOGY-ONCOLOGY PROGRESS NOTE  ASSESSMENT AND PLAN: 1.  Abdominal pain secondary to small bowel obstruction and peritoneal metastasis 2.  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  Nausea and vomiting 4.  Hypokalemia 5.  GERD 6.  Protein calorie malnutrition 7.  Mild neutropenia secondary to recent chemotherapy 8.  Goals of care discussion  -Abdominal pain controlled at this time with Dilaudid PCA.  Palliative care following and assisting with pain medication management. -The patient is not yet ready for hospice.  We are willing to give him 1 more cycle of chemotherapy.  PICC line has been placed.  If pain is well controlled, will consider additional chemotherapy early next week. -Continue TPN. -Goals of treatment are for palliation of symptoms.  He understands that his disease is not curable.  He agrees to limited code to include CPR and ACLS medications, but no defibrillation or cardioversion or mechanical ventilation.  Mikey Bussing, DNP, AGPCNP-BC, AOCNP  SUBJECTIVE: Pain better controlled this morning.  Denies nausea and vomiting.  States that he is no longer hallucinating.  TPN infusing.  Oncology History Overview Note  Cancer Staging metastatic cecal cancer Staging form: Colon and Rectum, AJCC 8th Edition - Clinical stage from 11/29/2020: Stage IVC (cTX, cN2, pM1c) - Signed by [name], MD on 12/04/2020 Stage prefix: Initial diagnosis Histologic grade (G): G3 Histologic grading system: 4 grade system    metastatic cecal cancer  11/27/2020 Imaging   CT Angio CAP  IMPRESSION: 1. Thickening of the terminal ileum with associated proximal mid to distal small bowel obstruction. Findings could be due to an ileitis versus malignancy. Nonspecific mesenteric edema could be due to engorgement versus metastases. No associated bowel perforation. Recommend endoscopy for further evaluation. 2. Indeterminate right lower quadrant lymphadenopathy. 3. Scattered colonic  diverticulosis with no acute diverticulitis. 4. Stable right hepatic lobe subcentimeter hyperdensity likely represents a hepatic hemangioma. 5. No acute vascular abnormality. Aortic Atherosclerosis (ICD10-I70.0) - mild. 6. No acute intrathoracic abnormality.   11/28/2020 Imaging   CT AP  IMPRESSION: 1. There is masslike, circumferential thickening of the terminal ileum and cecal base near the ileocecal valve and abnormally enlarged lymph nodes in the right lower quadrant mesentery adjacent to the terminal ileum measuring up to 2.3 x 1.4 cm. 2. There is extensive omental and peritoneal nodularity and caking throughout the abdomen. 3. Findings are highly concerning for primary colon malignancy with nodal and peritoneal metastatic disease. 4. Small volume perihepatic and perisplenic ascites. 5. The small bowel is generally decompressed, with full some fluid-filled, nondistended loops throughout. There is transit of oral enteric contrast to the terminal ileum. No evidence of overt bowel obstruction at this time. Esophagogastric tube is position with tip and side port below the diaphragm. 6. Atelectasis or consolidation of the dependent bilateral lung bases, new compared to prior examination.   Aortic Atherosclerosis (ICD10-I70.0).     11/29/2020 Surgery   EXPLORATORY LAPAROTOMY, PARTIAL BOWEL RESECTION, POSSIBLE OSTOMY CREATION, PERITIONEAL BIOPSY, INSERTION OF GASTROSTOMY TUBE by Dr [name]   11/29/2020 Initial Biopsy   FINAL MICROSCOPIC DIAGNOSIS:   AB. 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    COMMENT:   Immunohistochemistry (IHC) for CK20 and CDX-2 is strong and diffusely  positive.  CK7, TTF-1, Synaptophysin, Chromogranin and CD56 are  negative.  The immunophenotype is compatible with origin from the lower  gastrointestinal tract.  IHC for MMR will be reported separately.  Case  preliminarily  discussed with Dr.  [name] on 12/02/2020.   At the request of Dr. [name], [phone] was reviewed in retrospect.  Review of the submitted sections confirms the presence of acute  appendicitis.  No malignancy is identified.    11/29/2020 Cancer Staging   Staging form: Colon and Rectum, AJCC 8th Edition - Clinical stage from 11/29/2020: Stage IVC (cTX, cN2, pM1c) - Signed by [name], MD on 12/04/2020 Stage prefix: Initial diagnosis Histologic grade (G): G3 Histologic grading system: 4 grade system   12/04/2020 Initial Diagnosis   Cancer of ascending colon metastatic to intra-abdominal lymph node ([place])    Chemotherapy   FOLFOX q2weeks    12/18/2020 - 02/15/2021 Chemotherapy      Patient is on Antibody Plan: COLORECTAL BEVACIZUMAB Q14D    01/03/2021 Imaging   CT A/P IMPRESSION: 1. Wall thickening of the terminal ileum again seen. Fluid-filled distal small bowel, ascending, and transverse colon without obstruction. 2. Equivocal improvement in omental and peritoneal metastatic disease with slightly improved small volume perihepatic and perisplenic ascites. Right lower quadrant mesenteric adenopathy is similar or mildly improved. 3. Sigmoid colonic diverticulosis. Mild mural wall thickening in the sigmoid, no acute diverticulitis. 4. Gastrostomy tube in place, balloon normally positioned in the stomach. 5. Chronic bilateral L5 pars interarticularis defects with trace anterolisthesis of L5 on S1. Chronic avascular necrosis of the left femoral head.   01/16/2021 Genetic Testing   Negative genetic testing. CTNNA1 [id number] VUS, RAD51D p.G140E VUS and TSC1 p.R768H VUS found on the CancerNext-Expanded+RNAinsight.  The CancerNext-Expanded gene panel offered by [facility] and includes sequencing and rearrangement analysis for the following 77 genes: AIP, ALK, APC*, ATM*, AXIN2, BAP1, BARD1, BLM, BMPR1A, BRCA1*, BRCA2*, BRIP1*, CDC73, CDH1*, CDK4, CDKN1B, CDKN2A, CHEK2*,  CTNNA1, DICER1, FANCC, FH, FLCN, GALNT12, KIF1B, LZTR1, MAX, MEN1, MET, MLH1*, MSH2*, MSH3, MSH6*, MUTYH*, NBN, NF1*, NF2, NTHL1, PALB2*, PHOX2B, PMS2*, POT1, PRKAR1A, PTCH1, PTEN*, RAD51C*, RAD51D*, RB1, RECQL, RET, SDHA, SDHAF2, SDHB, SDHC, SDHD, SMAD4, SMARCA4, SMARCB1, SMARCE1, STK11, SUFU, TMEM127, TP53*, TSC1, TSC2, VHL and XRCC2 (sequencing and deletion/duplication); EGFR, EGLN1, HOXB13, KIT, MITF, PDGFRA, POLD1, and POLE (sequencing only); EPCAM and GREM1 (deletion/duplication only). DNA and RNA analyses performed for * genes. The report date is Jan 16, 2021.   02/24/2021 Imaging   CT AP  IMPRESSION: 1. Interval resolution of previously seen small volume ascites throughout the abdomen and pelvis. There is redemonstrated, ill-defined stranding and thickening of the peritoneal surfaces and omentum, which is somewhat diminished compared to prior examination. 2. Interval improvement in right lower quadrant mesocolon lymph nodes. 3. Findings are consistent with treatment response of nodal and peritoneal metastatic disease. 4. Unchanged, matted appearing thickening of the terminal ileum and cecal base. 5. Sigmoid diverticulosis with wall thickening of the mid to distal sigmoid, similar to prior examination. Findings are most suggestive of chronic sequelae of diverticulitis.   Aortic Atherosclerosis (ICD10-I70.0).   03/13/2021 -  Chemotherapy   Patient is on Treatment Plan : COLORECTAL FOLFOXIRI + Bevacizumab q14d     05/13/2021 Procedure   OPERATIVE PROCEDURE: Laparoscopy and peritoneal biopsy.  SURGEON: Merlyn Albert. Clovis Riley, MD   05/13/2021 Pathology Results   Final Pathologic Diagnosis    A. PERITONEAL, BIOPSY :              Metastatic adenocarcinoma.               See comment.    Comment    The biopsies show predominately fibrosis  and fibroadipose tissue with a small focus of moderately differentiated adenocarcinoma.  Given the patient's history, the findings favor metastasis of the  patient's known colonic cancer.     07/01/2021 Imaging   CT CAP  IMPRESSION: Chest Impression:   1. No evidence of thoracic metastasis.   Abdomen / Pelvis Impression:   1. Thickening through the terminal ileum similar to prior. No ileocecal lymphadenopathy. 2. One focus linear thickening the peritoneum adjacent to loops of small bowel in the ventral abdomen which extend to the proximal sigmoid colon are more prominent than prior and concerning for residual peritoneal carcinoma. 3. No evidence of solid organ metastasis in the abdomen pelvis.      REVIEW OF SYSTEMS:   Review of Systems  Constitutional:  Negative for chills and fever.  HENT: Negative.    Eyes: Negative.   Respiratory: Negative.    Cardiovascular: Negative.   Gastrointestinal:  Positive for abdominal pain. Negative for nausea and vomiting.       Abdomen pain well controlled at this time with Dilaudid PCA  Skin: Negative.   Neurological: Negative.   Endo/Heme/Allergies: Negative.   Psychiatric/Behavioral: Negative.     I have reviewed the past medical history, past surgical history, social history and family history with the patient and they are unchanged from previous note.   PHYSICAL EXAMINATION: ECOG PERFORMANCE STATUS: 2 - Symptomatic, <50% confined to bed  Vitals:   10/03/21 0816 10/03/21 0949  BP:  134/79  Pulse:  70  Resp: 12 20  Temp:  97.7 F (36.5 C)  SpO2: 94% 97%   Filed Weights   09/30/21 2035 10/03/21 0500  Weight: 117 kg 102.7 kg    Intake/Output from previous day: 01/19 0701 - 01/20 0700 In: 1882.5 [P.O.:860; I.V.:1022.5] Out: 3550 [Urine:1600; Drains:1950]  Physical Exam Vitals reviewed.  Constitutional:      General: He is not in acute distress. HENT:     Head: Normocephalic.     Mouth/Throat:     Mouth: Mucous membranes are moist.     Pharynx: No oropharyngeal exudate.  Eyes:     General: No scleral icterus. Cardiovascular:     Rate and Rhythm: Normal rate and  regular rhythm.     Pulses: Normal pulses.  Pulmonary:     Effort: Pulmonary effort is normal.     Breath sounds: Normal breath sounds.  Abdominal:     Comments: Mildly distended. No pain with palpation.   Skin:    General: Skin is warm and dry.  Neurological:     Mental Status: He is alert and oriented to person, place, and time.  Psychiatric:        Mood and Affect: Mood normal.        Behavior: Behavior normal.        Thought Content: Thought content normal.        Judgment: Judgment normal.    LABORATORY DATA:  I have reviewed the data as listed CMP Latest Ref Rng & Units 10/03/2021 10/01/2021 09/30/2021  Glucose 70 - 99 mg/dL 146(H) 117(H) 113(H)  BUN 6 - 20 mg/dL 28(H) 29(H) 34(H)  Creatinine 0.61 - 1.24 mg/dL 1.32(H) 1.34(H) 1.12  Sodium 135 - 145 mmol/L 134(L) 139 138  Potassium 3.5 - 5.1 mmol/L 3.8 4.6 3.2(L)  Chloride 98 - 111 mmol/L 93(L) 97(L) 97(L)  CO2 22 - 32 mmol/L 33(H) 33(H) 31  Calcium 8.9 - 10.3 mg/dL 8.5(L) 8.6(L) 8.5(L)  Total Protein 6.5 - 8.1 g/dL 7.5 7.3 7.5  Total Bilirubin 0.3 - 1.2 mg/dL 1.3(H) 1.4(H) 1.2  Alkaline Phos  38 - 126 U/L 98 111 120  AST 15 - 41 U/L '23 24 21  ' ALT 0 - 44 U/L '21 24 21    ' Lab Results  Component Value Date   WBC 7.0 10/03/2021   HGB 13.2 10/03/2021   HCT 40.7 10/03/2021   MCV 86.6 10/03/2021   PLT 350 10/03/2021   NEUTROABS 4.1 10/03/2021    Lab Results  Component Value Date   CEA1 1.9 11/29/2020   VXB939 4 11/30/2020    X-ray chest PA and lateral  Result Date: 09/14/2021 CLINICAL DATA:  56 year old male with a history of colon cancer, recurrent SBO s/p venting gastrostomy tube, who presents to the emergency department today for evaluation of abdominal pain. Of note, patient recently admitted to the hospital for recurrent SBO and had a gastrostomy tube placed. He was actually discharged from the hospital yesterday. Since then he states he has been draining his PEG tube and he continues to have a significant amount  of output so he has not drained in several hours. He is now complaining of severe abdominal pain, distention, nausea vomiting. EXAM: CHEST - 2 VIEW COMPARISON:  01/04/2021. FINDINGS: Increased opacity at the left lung base compared to the prior exam, consistent with atelectasis, pneumonia or a combination. Remainder of the lungs is clear. Small left pleural effusion.  No pneumothorax. Cardiac silhouette normal in size. No mediastinal or hilar masses or evidence of adenopathy. Stable right internal jugular Port-A-Cath. Skeletal structures are grossly intact. IMPRESSION: 1. Left lower lung opacity consistent with a combination of atelectasis/pneumonia and a small effusion, new compared to the prior chest radiograph. Electronically Signed   By: Lajean Manes M.D.   On: 09/14/2021 10:59   Abd 1 View (KUB)  Result Date: 09/14/2021 CLINICAL DATA:  History of colon cancer with recurrent small bowel obstruction. Patient presents with abdominal pain. EXAM: ABDOMEN - 1 VIEW COMPARISON:  09/08/21 FINDINGS: Left upper quadrant gastrostomy tube identified. Since the previous exam there is been interval improvement in gaseous distension the of the small and large bowel loops. No new findings. IMPRESSION: Interval improvement in gaseous distension of the small and large bowel loops compared with 09/08/2021. Electronically Signed   By: Kerby Moors M.D.   On: 09/14/2021 10:57   DG Abd 1 View  Result Date: 09/08/2021 CLINICAL DATA:  Abdominal pain, nausea, vomiting EXAM: ABDOMEN - 1 VIEW COMPARISON:  09/07/2021 FINDINGS: Prominent small bowel loops centrally again noted, similar prior study compatible with persistent small bowel obstruction. No free air or organomegaly. Visualized lung bases clear. IMPRESSION: Continued small bowel obstruction pattern, not significantly changed. Electronically Signed   By: Rolm Baptise M.D.   On: 09/08/2021 15:12   DG Abd 1 View  Result Date: 09/07/2021 CLINICAL DATA:  Nausea and  vomiting; EXAM: ABDOMEN - 1 VIEW COMPARISON:  September 04, 2021 ;September 03, 2021; September 02, 2021 FINDINGS: There are a few mildly dilated loops of bowel in the upper abdomen, improved in comparison to prior from September 03, 2021. Interval removal of enteric tube. Partial visualization of CVC tip terminating over the region of the superior cavoatrial junction. No bowel gas visualized in the rectum. IMPRESSION: Persistent mild dilation of several loops of small bowel in the upper abdomen, overall improved since September 03, 2021. This could reflect persistent small-bowel obstruction. Electronically Signed   By: Valentino Saxon M.D.   On: 09/07/2021 14:00   IR GASTROSTOMY TUBE MOD SED  Result Date: 09/11/2021 CLINICAL DATA:  History of metastatic  colon carcinoma with persistent small-bowel obstruction and request to place a venting gastrostomy tube for symptomatic relief. EXAM: PERCUTANEOUS GASTROSTOMY TUBE PLACEMENT ANESTHESIA/SEDATION: Moderate (conscious) sedation was employed during this procedure. A total of Versed 4.0 mg and Fentanyl 100 mcg was administered intravenously by radiology nursing. Moderate Sedation Time: 15 minutes. The patient's level of consciousness and vital signs were monitored continuously by radiology nursing throughout the procedure under my direct supervision. CONTRAST:  53m OMNIPAQUE IOHEXOL 300 MG/ML  SOLN MEDICATIONS: 2 g IV Ancef. IV antibiotic was administered in an appropriate time interval prior to needle puncture of the skin. During the procedure the patient received 0.5 mg IV glucagon. FLUOROSCOPY TIME:  2 minutes and 24 seconds.  51.0 mGy. PROCEDURE: The procedure, risks, benefits, and alternatives were explained to the patient. Questions regarding the procedure were encouraged and answered. The patient understands and consents to the procedure. A time-out was performed prior to initiating the procedure. A 5-French catheter was advanced through the patient's mouth  under fluoroscopy into the esophagus and to the level of the stomach. This catheter was used to insufflate the stomach with air under fluoroscopy. The abdominal wall was prepped with chlorhexidine in a sterile fashion, and a sterile drape was applied covering the operative field. A sterile gown and sterile gloves were used for the procedure. Local anesthesia was provided with 1% Lidocaine. A skin incision was made in the upper abdominal wall. Under fluoroscopy, an 18 gauge trocar needle was advanced into the stomach. Contrast injection was performed to confirm intraluminal position of the needle tip. A single T tack was then deployed in the lumen of the stomach. This was brought up to tension at the skin surface. Over a guidewire, a 9-French sheath was advanced into the lumen of the stomach. The wire was left in place as a safety wire. A loop snare device from a percutaneous gastrostomy kit was then advanced into the stomach. A floppy guide wire was advanced through the orogastric catheter under fluoroscopy in the stomach. The loop snare advanced through the percutaneous gastric access was used to snare the guide wire. This allowed withdrawal of the loop snare out of the patient's mouth by retraction of the orogastric catheter and wire. A 20-French bumper retention gastrostomy tube was looped around the snare device. It was then pulled back through the patient's mouth. The retention bumper was brought up to the anterior gastric wall. The T tack suture was cut at the skin. The exiting gastrostomy tube was cut to appropriate length and a feeding adapter applied. The catheter was injected with contrast material to confirm position and a fluoroscopic spot image saved. The tube was then flushed with saline. A dressing was applied over the gastrostomy exit site. COMPLICATIONS: None. FINDINGS: The stomach distended well with air allowing safe placement of the gastrostomy tube. After placement, the tip of the gastrostomy  tube lies in the body of the stomach. IMPRESSION: Percutaneous gastrostomy with placement of a 20-French bumper retention tube in the body of the stomach. Electronically Signed   By: GAletta EdouardM.D.   On: 09/11/2021 16:11   IR Fluoro Guide CV Line Left  Result Date: 10/02/2021 INDICATION: 56year old with malnutrition and needs PICC line for TPN. EXAM: LEFT ARM PICC LINE PLACEMENT WITH ULTRASOUND AND FLUOROSCOPIC GUIDANCE MEDICATIONS: Local anesthetic, 1% lidocaine ANESTHESIA/SEDATION: None FLUOROSCOPY TIME:  18 seconds, 3 mGy COMPLICATIONS: None immediate. PROCEDURE: The patient was advised of the possible risks and complications and agreed to undergo the procedure. The patient  was then brought to the angiographic suite for the procedure. The left arm was prepped with chlorhexidine, draped in the usual sterile fashion using maximum barrier technique (cap and mask, sterile gown, sterile gloves, large sterile sheet, hand hygiene and cutaneous antisepsis) and infiltrated locally with 1% Lidocaine. Ultrasound demonstrated patency of the left brachial vein, and this was documented with an image. Under real-time ultrasound guidance, this vein was accessed with a 21 gauge micropuncture needle and image documentation was performed. A 0.018 wire was introduced in to the vein. Over this, a 5 [place] dual lumen lumen power injectable PICC was advanced to the lower SVC/right atrial junction. Fluoroscopy during the procedure and fluoro spot radiograph confirms appropriate catheter position. The catheter was flushed and covered with a sterile dressing. Catheter length: 44 cm FINDINGS: Ultrasound demonstrated a patent left brachial vein. Left basilic vein is not compressible and thrombosed. PICC line tip at the superior cavoatrial junction. Existing right jugular Port-A-Cath tip is also at the superior cavoatrial junction. IMPRESSION: Successful left arm power injectable PICC line placement with ultrasound and  fluoroscopic guidance. The catheter is ready for use. Electronically Signed   By: Markus Daft M.D.   On: 10/02/2021 11:32   DG CHEST PORT 1 VIEW  Result Date: 09/18/2021 CLINICAL DATA:  Left PICC placement EXAM: PORTABLE CHEST 1 VIEW COMPARISON:  09/14/2021 FINDINGS: Single frontal view of the chest demonstrates stable right chest wall port. Left-sided PICC tip overlies the superior vena cava. Cardiac silhouette is stable. Stable left basilar consolidation. Small effusion is suspected. Right chest is clear. No pneumothorax. IMPRESSION: 1. Left-sided PICC tip overlying superior vena cava. 2. Continued left basilar consolidation and likely small effusion. Electronically Signed   By: Randa Ngo M.D.   On: 09/18/2021 17:32   DG ABD ACUTE 2+V W 1V CHEST  Result Date: 09/30/2021 CLINICAL DATA:  Abdominal pain. EXAM: DG ABDOMEN ACUTE WITH 1 VIEW CHEST COMPARISON:  Abdominal radiographs 09/14/2021 CT abdomen and pelvis 09/02/2021 FINDINGS: Large body habitus. Right chest wall porta catheter with tip overlying the superior vena cava/right atrial junction. Cardiac silhouette and mediastinal contours are within normal limits. Mild calcification within aortic arch. The lungs are clear. No pleural effusion or pneumothorax. A gastrostomy tube again overlies the left upper quadrant. There are air-fluid levels within multiple loops of bowel on upright view. There may be mild distention of small-bowel loops measuring up to approximately 4 cm in caliber No portal venous gas or pneumatosis is seen. No subdiaphragmatic free air on upright view. IMPRESSION: There are air-fluid levels throughout the small bowel. There is likely dilatation of some small bowel loops at least 4 cm. Note is made of small bowel obstruction with similar small bowel dilatation due to adhesions to the anterior abdominal wall seen on prior CT. Gastrostomy tube is again noted. Electronically Signed   By: Yvonne Kendall M.D.   On: 09/30/2021 09:29   DG  Abd Portable 1V-Small Bowel Obstruction Protocol-24 hr delay  Result Date: 09/04/2021 CLINICAL DATA:  Small-bowel obstruction. Bowel obstruction protocol/24 delay image. EXAM: PORTABLE ABDOMEN - 1 VIEW COMPARISON:  Radiographs 09/03/2021 and 08/27/2021.  CT 09/02/2021. FINDINGS: 1120 hours. Two views obtained. Tip of the nasogastric tube projects over the distal stomach. The enteric contrast has passed into the colon which appears decompressed. Previously noted small bowel distension is improved. No extraluminal contrast or air collections are identified. Telemetry leads overlie the chest and upper abdomen. IMPRESSION: Antegrade passage of contrast into decompressed colon with improved small bowel  dilatation. No evidence of bowel obstruction or perforation. Electronically Signed   By: Richardean Sale M.D.   On: 09/04/2021 13:43   DG Abd Portable 1V-Small Bowel Obstruction Protocol-initial, 8 hr delay  Result Date: 09/03/2021 CLINICAL DATA:  Small-bowel obstruction. EXAM: PORTABLE ABDOMEN - 1 VIEW COMPARISON:  09/03/2021 FINDINGS: Nasogastric tube overlies expected mid body of the stomach. Contrast is seen within the gastric fundus. There has been little antegrade passage of contrast into the remainder of the abdomen. Multiple loops of gas-filled dilated small bowel are seen within the epigastrium and left abdomen in keeping with changes of a mid small bowel obstruction. No gross free intraperitoneal gas. No organomegaly. IMPRESSION: No significant antegrade passage of contrast from the gastric lumen. Persistent findings in keeping with a mid small bowel obstruction. Electronically Signed   By: Fidela Salisbury M.D.   On: 09/03/2021 19:48   ECHOCARDIOGRAM COMPLETE  Result Date: 10/01/2021    ECHOCARDIOGRAM REPORT   Patient Name:   DEFORREST BOGLE Viera Date of Exam: 10/01/2021 Medical Rec #:  [id number]       Height:       71.0 in Accession #:    [id number]      Weight:       257.9 lb Date of Birth:  Nov 02, 1965         BSA:          2.349 m Patient Age:    56 years        BP:           132/79 mmHg Patient Gender: M               HR:           112 bpm. Exam Location:  Inpatient Procedure: 2D Echo, Cardiac Doppler, Color Doppler and Intracardiac            Opacification Agent Indications:    Abnormal ECG R94.31  History:        Patient has no prior history of Echocardiogram examinations.                 Risk Factors:Dyslipidemia. GERD.  Sonographer:    Darlina Sicilian RDCS Referring Phys: [id number] DAVID MANUEL [place]  1. Left ventricular ejection fraction, by estimation, is 65 to 70%. The left ventricle has normal function. The left ventricle has no regional wall motion abnormalities. Left ventricular diastolic parameters are consistent with Grade I diastolic dysfunction (impaired relaxation).  2. Right ventricular systolic function is normal. The right ventricular size is normal.  3. The mitral valve is normal in structure. No evidence of mitral valve regurgitation. No evidence of mitral stenosis.  4. The aortic valve is normal in structure. Aortic valve regurgitation is not visualized. No aortic stenosis is present.  5. The inferior vena cava is normal in size with greater than 50% respiratory variability, suggesting right atrial pressure of 3 mmHg. FINDINGS  Left Ventricle: Left ventricular ejection fraction, by estimation, is 65 to 70%. The left ventricle has normal function. The left ventricle has no regional wall motion abnormalities. Definity contrast agent was given IV to delineate the left ventricular  endocardial borders. The left ventricular internal cavity size was normal in size. There is no left ventricular hypertrophy. Left ventricular diastolic parameters are consistent with Grade I diastolic dysfunction (impaired relaxation). Normal left ventricular filling pressure. Right Ventricle: The right ventricular size is normal. No increase in right ventricular wall thickness. Right ventricular systolic function  is normal. Left Atrium:  Left atrial size was normal in size. Right Atrium: Right atrial size was normal in size. Pericardium: There is no evidence of pericardial effusion. Mitral Valve: The mitral valve is normal in structure. No evidence of mitral valve regurgitation. No evidence of mitral valve stenosis. Tricuspid Valve: The tricuspid valve is normal in structure. Tricuspid valve regurgitation is not demonstrated. No evidence of tricuspid stenosis. Aortic Valve: The aortic valve is normal in structure. Aortic valve regurgitation is not visualized. No aortic stenosis is present. Pulmonic Valve: The pulmonic valve was normal in structure. Pulmonic valve regurgitation is not visualized. No evidence of pulmonic stenosis. Aorta: The aortic root is normal in size and structure. Venous: The inferior vena cava is normal in size with greater than 50% respiratory variability, suggesting right atrial pressure of 3 mmHg. IAS/Shunts: No atrial level shunt detected by color flow Doppler.  LEFT VENTRICLE PLAX 2D LVIDd:         5.00 cm   Diastology LVIDs:         2.80 cm   LV e' medial:    6.53 cm/s LV PW:         0.90 cm   LV E/e' medial:  7.0 LV IVS:        1.00 cm   LV e' lateral:   6.74 cm/s LVOT diam:     2.80 cm   LV E/e' lateral: 6.8 LV SV:         45 LV SV Index:   19 LVOT Area:     6.16 cm  RIGHT VENTRICLE RV S prime:     9.14 cm/s TAPSE (M-mode): 1.5 cm LEFT ATRIUM         Index LA diam:    2.50 cm 1.06 cm/m  AORTIC VALVE LVOT Vmax:   49.00 cm/s LVOT Vmean:  30.900 cm/s LVOT VTI:    0.073 m  AORTA Ao Root diam: 3.80 cm Ao Asc diam:  3.50 cm MITRAL VALVE MV Area (PHT): 3.58 cm    SHUNTS MV Decel Time: 212 msec    Systemic VTI:  0.07 m MV E velocity: 45.80 cm/s  Systemic Diam: 2.80 cm MV A velocity: 60.00 cm/s MV E/A ratio:  0.76 Skeet Latch MD Electronically signed by Skeet Latch MD Signature Date/Time: 10/01/2021/12:05:12 PM    Final    [place] EKG SITE RITE  Result Date: 09/30/2021 If Site Rite image not  attached, placement could not be confirmed due to current cardiac rhythm.  [place] EKG Site Rite  Result Date: 09/18/2021 If Site Rite image not attached, placement could not be confirmed due to current cardiac rhythm.  [place] EKG SITE RITE  Result Date: 09/18/2021 If Site Rite image not attached, placement could not be confirmed due to current cardiac rhythm.    No future appointments.     LOS: 2 days   Addendum  I have seen the patient, examined him. I agree with the assessment and and plan and have edited the notes.   Mr. Valente feels his pain is better controlled today, he also ate Jell-O and applesauce for lunch today, and tolerated well.  He still has large output from the venting G-tube. I appreciate Dr. Sherral Hammers and palliative care team to hep managing his symptoms. Will f/u on Monday and re-evaluate his Candidacy for chemotherapy. Please call us if needed during the weekend.   [name]  10/03/2021

## 2021-10-03 NOTE — Progress Notes (Signed)
PROGRESS NOTE    Ricardo [name]  XBL:[id number] DOB: 1965-11-07 DOA: 09/30/2021 PCP: [name], MD   Brief Narrative:  56 y.o. 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   ED Course: Initial vital signs were temperature 99.1 F, pulse 133, respiration 20, BP 138/91 mmHg O2 sat 100% on room air.  The patient received 2000 mL of LR bolus hydromorphone 2 mg IVP x1 and Compazine 10 mg IVP x1.   Lab work: CBC is her white count 2.2, hemoglobin 13.5 g/dL platelets 252.  CMP with potassium of 3.2 and chloride 97 mmol/L.  LFTs were normal SF4 and albumin of 3.0 g/dL.  Glucose 113, BUN 34 and creatinine 1.12 mg/dL.   Review of Systems: As per HPI otherwise all other systems reviewed and are negative.   Subjective: 1/20 afebrile overnight A/O x4, patient states pain a little bit better controlled but not fully controlled yet.   Assessment & Plan:  Covid vaccination;  Principal Problem:   Intractable abdominal pain Active Problems:   Nausea and vomiting   GERD (gastroesophageal reflux disease)   Colon cancer metastasized to multiple sites [facility])   Class 2 obesity   Leukopenia   Hypokalemia  Colon cancer metastasized to multiple sites ([place]) -Poor overall prognosis. -1/18 spoke with Dr. Burr Medico.  And she is aware that patient has been rehospitalized.  States will come I see patient today.  Will await any additional recommendations -1/19 hematology oncology recommendation: -The patient is not yet  ready for hospice.  We are willing to give him 1 more cycle of chemotherapy.  PICC line has been placed.  Timing of chemo per Dr. Burr Medico.  -He has not yet started his home TPN.  PICC line has been placed.  TPN order set  has been placed. -Goals of treatment are for palliation of symptoms.  He understands that his  disease is not curable.  He agrees to limited code to include CPR and ACLS  medications, but no defibrillation or cardioversion or mechanical ventilation   Intractable abdominal pain -Patient pain controlled on PCA pump + fentanyl patch. - Had a long conversation with patient concerning his goals for discharge.  Patient does not want care team to give up on him though he understands that serious condition. - 1/19 increase Fentanyl patch to 200 mcg/hr.  Due for change on 1/19. - 1/19 have decreased Dilaudid per PCA pump -We will await any additional recommendations from Dr. Burr Medico oncology -1/20 we will continue to work with palliative care on optimizing patient's pain regimen.    Nausea and vomiting - Per patient mostly resolved.  Requested to start consuming p.o. - Currently continue n.p.o. -1/19 clear liquid diet  Moderate protein malnutrition ([place]) -1/18 awaiting placement of PICC line by IR, IV team unsuccessful - Restart TPN per pharmacy as soon as PICC line placed. -1/19 TPN should be restarted on 1/20 as PICC line was replaced late today. -1/20 TPN started  Hypokalemia -Potassium goal> 4  Acute Diastolic CHF - 3/00 echocardiogram consistent with acute diastolic failure. - Strict in and  out -1.4 L - Daily weight Filed Weights   09/30/21 2035 10/03/21 0500  Weight: 117 kg 102.7 kg     Sinus tachycardia -Continue IV fluids.   -Metoprolol 12.5 mg BID -1/19 currently sinus tachycardia, if continues overnight will increase Metoprolol    GERD (gastroesophageal reflux disease) -Pantoprazole has been increased to 40 mg IVP BID.   GAD (generalized anxiety  disorder) -Continue alprazolam as needed. -Parenteral lorazepam as needed if unable to PO. -1/19 Cymbalta 30 mg daily (should also help with pain)  Class 2 obesity    Normocytic anemia -1/19 anemia panel; consistent with anemia of chronic disease - Monitor H/H  Lab Results  Component Value Date   HGB 13.2 10/03/2021   HGB 12.5 (L) 10/01/2021   HGB 13.5 09/30/2021   HGB 11.1 (L) 09/17/2021   HGB 11.2 (L) 09/15/2021       DVT prophylaxis: Lovenox per pharmacy Code Status: Partial Family Communication:  Status is: Inpatient    Dispo: The patient is from: Home              Anticipated d/c is to: Home              Anticipated d/c date is: 2 days              Patient currently is not medically stable to d/c.      Consultants:  Palliative care IR Oncology Dr. Burr Medico    Procedures/Significant Events:  1/18 Echocardiogram Left Ventricle: Left ventricular ejection fraction, by estimation, is 65  to 70%. The left ventricle has normal function. The left ventricle has no  regional wall motion abnormalities. Definity contrast agent was given IV  to delineate the left ventricular   endocardial borders. The left ventricular internal cavity size was normal  in size. There is no left ventricular hypertrophy. Left ventricular  diastolic parameters are consistent with Grade I diastolic dysfunction  (impaired relaxation). Normal left  ventricular filling pressure.     I have personally reviewed and interpreted all radiology studies and my findings are as above.  VENTILATOR SETTINGS: Nasal cannula 1/20 Flow 2 L/min SPO2 90%   Cultures   Antimicrobials:    Devices    LINES / TUBES:  Dual lumen PowerPICC placed in left brachial vein 1/19>>>>    Continuous Infusions:  lactated ringers 75 mL/hr at 10/03/21 0122   lactated ringers     TPN ADULT (ION) 40 mL/hr at 10/02/21 1718   TPN ADULT (ION)       Objective: Vitals:   10/03/21 0523 10/03/21 0816  10/03/21 0949 10/03/21 1210  BP: 115/87  134/79   Pulse: 70  70   Resp: 17 12 20 12   Temp: 97.7 F (36.5 C)  97.7 F (36.5 C)   TempSrc: Oral  Oral   SpO2: 98% 94% 97% 98%  Weight:      Height:        Intake/Output Summary (Last 24 hours) at 10/03/2021 1222 Last data filed at 10/03/2021 1135 Gross per 24 hour  Intake 1372.52 ml  Output 3450 ml  Net -2077.48 ml    Filed Weights   09/30/21 2035 10/03/21 0500  Weight: 117 kg 102.7 kg    Examination:  General: A/O x4, No acute respiratory distress Eyes: negative scleral hemorrhage, negative anisocoria, negative icterus ENT: Negative Runny nose, negative gingival bleeding, Neck:  Negative scars, masses, torticollis, lymphadenopathy, JVD Lungs: Clear to auscultation bilaterally without wheezes or crackles Cardiovascular: Regular rate and rhythm without  murmur gallop or rub normal S1 and S2 Abdomen: negative abdominal pain, nondistended, negative soft, bowel sounds, no rebound, no ascites, no appreciable mass Extremities: LEFT arm PICC in place, covered and clean No significant cyanosis, clubbing, or edema bilateral lower extremities Skin: Negative rashes, lesions, ulcers Psychiatric:  Negative depression, negative anxiety, negative fatigue, negative mania  Central nervous system:  Cranial nerves II through XII intact, tongue/uvula midline, all extremities muscle strength 5/5, sensation intact throughout, negative dysarthria, negative expressive aphasia, negative receptive aphasia.  .     Data Reviewed: Care during the described time interval was provided by me .  I have reviewed this patient's available data, including medical history, events of note, physical examination, and all test results as part of my evaluation.   CBC: Recent Labs  Lab 09/30/21 0830 10/01/21 0940 10/03/21 0340  WBC 2.2* 2.8* 7.0  NEUTROABS 1.1* 1.2* 4.1  HGB 13.5 12.5* 13.2  HCT 41.2 39.7 40.7  MCV 86.6 90.0 86.6  PLT 252 271 350    Basic  Metabolic Panel: Recent Labs  Lab 09/30/21 0830 10/01/21 0940 10/03/21 0340  NA 138 139 134*  K 3.2* 4.6 3.8  CL 97* 97* 93*  CO2 31 33* 33*  GLUCOSE 113* 117* 146*  BUN 34* 29* 28*  CREATININE 1.12 1.34* 1.32*  CALCIUM 8.5* 8.6* 8.5*  MG 2.2 2.6* 2.3  PHOS 3.0 4.6 4.0    GFR: Estimated Creatinine Clearance: 76.3 mL/min (A) (by C-G formula based on SCr of 1.32 mg/dL (H)). Liver Function Tests: Recent Labs  Lab 09/30/21 0830 10/01/21 0940 10/03/21 0340  AST 21 24 23   ALT 21 24 21   ALKPHOS 120 111 98  BILITOT 1.2 1.4* 1.3*  PROT 7.5 7.3 7.5  ALBUMIN 3.0* 2.8* 2.6*    Recent Labs  Lab 09/30/21 0830  LIPASE 33    No results for input(s): AMMONIA in the last 168 hours. Coagulation Profile: No results for input(s): INR, PROTIME in the last 168 hours. Cardiac Enzymes: No results for input(s): CKTOTAL, CKMB, CKMBINDEX, TROPONINI in the last 168 hours. BNP (last 3 results) No results for input(s): PROBNP in the last 8760 hours. HbA1C: No results for input(s): HGBA1C in the last 72 hours. CBG: Recent Labs  Lab 10/02/21 2155 10/03/21 0635  GLUCAP 124* 148*    Lipid Profile: Recent Labs    10/03/21 0340  TRIG 129   Thyroid Function Tests: No results for input(s): TSH, T4TOTAL, FREET4, T3FREE, THYROIDAB in the last 72 hours. Anemia Panel: Recent Labs    10/02/21 0337  VITAMINB12 1,418*  FOLATE 28.8  FERRITIN 2,480*  TIBC 168*  IRON 29*  RETICCTPCT 1.6    Urine analysis:    Component Value Date/Time   COLORURINE YELLOW 09/03/2021 0800   APPEARANCEUR CLEAR 09/03/2021 0800   LABSPEC >1.046 (H) 09/03/2021 0800   PHURINE 5.0 09/03/2021 0800   GLUCOSEU NEGATIVE 09/03/2021 0800   HGBUR NEGATIVE 09/03/2021 0800   BILIRUBINUR NEGATIVE 09/03/2021 0800   KETONESUR NEGATIVE 09/03/2021 0800   PROTEINUR NEGATIVE 09/03/2021 0800   NITRITE NEGATIVE 09/03/2021 0800   LEUKOCYTESUR NEGATIVE 09/03/2021 0800   Sepsis  Labs: @LABRCNTIP (procalcitonin:4,lacticidven:4)  )No results found for this or any previous visit (from the past 240 hour(s)).       Radiology Studies: No results found.      Scheduled Meds:  Chlorhexidine Gluconate Cloth  6 each Topical Daily   dexamethasone (DECADRON) injection  4 mg Intravenous Q24H   DULoxetine  30 mg Oral Daily  enoxaparin (LOVENOX) injection  50 mg Subcutaneous Q24H   fentaNYL  1 patch Transdermal Q72H   And   fentaNYL  1 patch Transdermal Q72H   HYDROmorphone   Intravenous Q4H   insulin aspart  0-9 Units Subcutaneous Q8H   metoprolol tartrate  12.5 mg Oral BID   pantoprazole (PROTONIX) IV  40 mg Intravenous Q12H   sodium chloride flush  10-40 mL Intracatheter Q12H   Continuous Infusions:  lactated ringers 75 mL/hr at 10/03/21 0122   lactated ringers     TPN ADULT (ION) 40 mL/hr at 10/02/21 1718   TPN ADULT (ION)       LOS: 2 days   The patient is critically ill with multiple organ systems failure and requires high complexity decision making for assessment and support, frequent evaluation and titration of therapies, application of advanced monitoring technologies and extensive interpretation of multiple databases. Critical Care Time devoted to patient care services described in this note  Time spent: 40 minutes     Cindy Fullman, Geraldo Docker, MD [facility]   If 7PM-7AM, please contact night-coverage 10/03/2021, 12:22 PM

## 2021-10-04 DIAGNOSIS — C189 Malignant neoplasm of colon, unspecified: Secondary | ICD-10-CM | POA: Diagnosis not present

## 2021-10-04 DIAGNOSIS — R109 Unspecified abdominal pain: Secondary | ICD-10-CM | POA: Diagnosis not present

## 2021-10-04 DIAGNOSIS — K2100 Gastro-esophageal reflux disease with esophagitis, without bleeding: Secondary | ICD-10-CM | POA: Diagnosis not present

## 2021-10-04 DIAGNOSIS — E669 Obesity, unspecified: Secondary | ICD-10-CM | POA: Diagnosis not present

## 2021-10-04 LAB — CBC WITH DIFFERENTIAL/PLATELET
Abs Immature Granulocytes: 1.17 10*3/uL — ABNORMAL HIGH (ref 0.00–0.07)
Basophils Absolute: 0 10*3/uL (ref 0.0–0.1)
Basophils Relative: 0 %
Eosinophils Absolute: 0 10*3/uL (ref 0.0–0.5)
Eosinophils Relative: 0 %
HCT: 40.6 % (ref 39.0–52.0)
Hemoglobin: 13 g/dL (ref 13.0–17.0)
Immature Granulocytes: 13 %
Lymphocytes Relative: 11 %
Lymphs Abs: 1 10*3/uL (ref 0.7–4.0)
MCH: 27.8 pg (ref 26.0–34.0)
MCHC: 32 g/dL (ref 30.0–36.0)
MCV: 86.9 fL (ref 80.0–100.0)
Monocytes Absolute: 1 10*3/uL (ref 0.1–1.0)
Monocytes Relative: 12 %
Neutro Abs: 5.6 10*3/uL (ref 1.7–7.7)
Neutrophils Relative %: 64 %
Platelets: 380 10*3/uL (ref 150–400)
RBC: 4.67 MIL/uL (ref 4.22–5.81)
RDW: 15.1 % (ref 11.5–15.5)
WBC: 8.8 10*3/uL (ref 4.0–10.5)
nRBC: 0.3 % — ABNORMAL HIGH (ref 0.0–0.2)

## 2021-10-04 LAB — COMPREHENSIVE METABOLIC PANEL
ALT: 21 U/L (ref 0–44)
AST: 20 U/L (ref 15–41)
Albumin: 2.6 g/dL — ABNORMAL LOW (ref 3.5–5.0)
Alkaline Phosphatase: 103 U/L (ref 38–126)
Anion gap: 10 (ref 5–15)
BUN: 31 mg/dL — ABNORMAL HIGH (ref 6–20)
CO2: 33 mmol/L — ABNORMAL HIGH (ref 22–32)
Calcium: 8.9 mg/dL (ref 8.9–10.3)
Chloride: 92 mmol/L — ABNORMAL LOW (ref 98–111)
Creatinine, Ser: 1.27 mg/dL — ABNORMAL HIGH (ref 0.61–1.24)
GFR, Estimated: >60 mL/min (ref >60)
Glucose, Bld: 174 mg/dL — ABNORMAL HIGH (ref 70–99)
Potassium: 3.9 mmol/L (ref 3.5–5.1)
Sodium: 135 mmol/L (ref 135–145)
Total Bilirubin: 1.1 mg/dL (ref 0.3–1.2)
Total Protein: 7.4 g/dL (ref 6.5–8.1)

## 2021-10-04 LAB — GLUCOSE, CAPILLARY
Glucose-Capillary: 139 mg/dL — ABNORMAL HIGH (ref 70–99)
Glucose-Capillary: 138 mg/dL — ABNORMAL HIGH (ref 70–99)
Glucose-Capillary: 127 mg/dL — ABNORMAL HIGH (ref 70–99)

## 2021-10-04 LAB — MAGNESIUM: Magnesium: 2.3 mg/dL (ref 1.7–2.4)

## 2021-10-04 LAB — PHOSPHORUS: Phosphorus: 4.5 mg/dL (ref 2.5–4.6)

## 2021-10-04 MED ORDER — TRAVASOL 10 % IV SOLN
INTRAVENOUS | Status: AC
  Filled 2021-10-04: qty 1322.4

## 2021-10-04 MED ORDER — LACTATED RINGERS IV SOLN: INTRAVENOUS | Status: DC

## 2021-10-04 MED ORDER — DEXTROSE 5 % IV SOLN: INTRAVENOUS | Status: DC

## 2021-10-04 MED ORDER — ALTEPLASE 2 MG IJ SOLR
2.0000 mg | Freq: Once | INTRAMUSCULAR | Status: AC
  Administered 2021-10-04: 2 mg
  Filled 2021-10-04: qty 2

## 2021-10-04 NOTE — Plan of Care (Signed)
  Problem: Activity: Goal: Risk for activity intolerance will decrease Outcome: Progressing   Problem: Pain Managment: Goal: General experience of comfort will improve Outcome: Progressing   Problem: Safety: Goal: Ability to remain free from injury will improve Outcome: Progressing   

## 2021-10-04 NOTE — Progress Notes (Signed)
PHARMACY - TOTAL PARENTERAL NUTRITION CONSULT NOTE   Indication: Small bowel obstruction  Patient Measurements: Height: 5\' 11"  (180.3 cm) Weight: 101 kg (222 lb 9.6 oz) IBW/kg (Calculated) : 75.3 TPN AdjBW (KG): 85.7 Body mass index is 31.05 kg/m. Usual Weight: Weight was stable 06/19/21-09/14/21 and is now -32 lb since 09/14/21  Assessment:  45 yoM with PMH metastatic colon cancer with peritoneal carcinomatosis with recent admissions for SBO readmitted 1/17 for same. Venting PEG recently placed, but continues to have abdominal pain despite draining collection bag 3-4x daily. PICC line palced 1/19 d/t difficulty accessing implanted chemo port.  Glucose / Insulin: No hx DM. Hgb A1c 5.5% on 09/14/21 - CBG range 128-143, 3 units SSI required - On dexamethasone 4mg  IV q24h Electrolytes: All wnl; chloride low, CO2 elevated with max chloride in TPN Renal:  SCr elevated since previous admission; BUN elevated but trending down; bicarb elevated and rising; UOP remains excellent Hepatic: Tbili decreased into normal range; Albumin low; otherwise LFTs WNL Trig: wnl (129 (1/20) Intake / Output; MIVF:  >2L daily from venting PEG - UOP 1676ml, 3 occurrences  - MIVF: LR at 55 ml/hr GI Imaging: - 1/17: persistent SBO GI Surgeries / Procedures:  - Venting PEG placed 12/29  Central access: double lumen PICC in L brachial vein placed 1/19 in IR TPN start date: 1/19  Home TPN formula received from [facility]: 2300 ml over 24hr with one hour taper up and down provides protein 125g/day, 2225 kcal/day.  Nutritional Goals: Goal TPN rate is 95 mL/hr (provides 132 g of protein and 2453 kcals per day)  RD Assessment: 1/20 Estimated Needs Total Energy Estimated Needs: 2365-2600 kcal Total Protein Estimated Needs: 120-135 grams Total Fluid Estimated Needs: >/= 2.5 L/day  Current Nutrition:  Clear liquids TPN at 60 ml/hr  Plan:  Increase TPN to goal rate 95 mL/hr at 1800 Electrolytes in TPN:  Na 68mEq/L,   K 73mEq/L,  Ca 52mEq/L (decrease),  Mg 31mEq/L,  Phos 8mmol/L.  Cl:Ac - MAX Cl Add standard MVI and trace elements to TPN Sensitive q8h SSI and adjust as needed  Reduce MIVF to KVO at 1800 Monitor TPN labs on Mon/Thurs, CMET, Mg, Phos in AM  Peggyann Juba, PharmD, BCPS Pharmacy: [phone] 10/04/2021,8:07 AM

## 2021-10-04 NOTE — Progress Notes (Signed)
Daily Progress Note   Patient Name: Ricardo [name]       Date: 10/04/2021 DOB: [birth year]  Age: 56 y.o. MRN#: [id number] Attending Physician: Allie Bossier, MD Primary Care Physician: [name], MD Admit Date: 09/30/2021 Length of Stay: 3 days  Reason for Consultation/Follow-up: Establishing goals of care  HPI/Patient Profile:  56 y.o. male  with past medical history of osteoarthritis, class II obesity, generalized anxiety disorder, hyperlipidemia, unspecified sleep disorder, GERD, colon cancer with metastasis and peritoneal carcinomatosis who was recently admitted and discharged yesterday due to SBO requiring PEG placement for venting purposes    Again returns to the hospital due to recurrence of abdominal pain, multiple episodes of nausea, emesis, despite draining the PEG tube collecting bag 3-4 times yesterday similar to while he experience when he was admitted on the first of the month.   PMT was consulted for pain management.  Subjective:   Subjective: Chart Reviewed. Updates received. Patient Assessed. Created space and opportunity for patient  and family to explore thoughts and feelings regarding current medical situation.  Today's Discussion: Met with the patient at bedside today.  He states that he had a rough night, episodically his pain was not well controlled overnight.  This morning he thinks he feels a little bit better.  He has transdermal fentanyl patch on.  PCA pump also interrogated.  Total of 21 mg used, he states that he is trying to stay on top of demand bolus requests so as to get a little bit more comfortable.  He remains hopeful for another cycle of chemotherapy and verbalizes understanding regarding not being ready for discharge yet because of uncontrolled pain.    Current regimen is basal rate 0.62m/hr and bolus dose 1 mg q 15 mins, in addition to transdermal fentanyl patch 200 mcg.  Review of Systems  Respiratory:  Negative for chest tightness.    Cardiovascular:  Negative for chest pain.  Gastrointestinal:  Positive for abdominal pain (improved). Negative for nausea and vomiting.   Objective:   Vital Signs:  BP 123/90 (BP Location: Right Arm)    Pulse (!) 103    Temp 97.6 F (36.4 C) (Oral)    Resp 16    Ht '5\' 11"'  (1.803 m)    Wt 101 kg    SpO2 96%    BMI 31.05 kg/m   Physical Exam: Physical Exam Vitals and nursing note reviewed.  Constitutional:      General: He is not in acute distress.    Appearance: He is ill-appearing.  HENT:     Head: Normocephalic and atraumatic.  Cardiovascular:     Rate and Rhythm: Normal rate.  Pulmonary:     Effort: No respiratory distress.     Breath sounds: No wheezing or rhonchi.  Skin:    General: Skin is warm and dry.  Neurological:     General: No focal deficit present.     Mental Status: He is alert.  Psychiatric:        Mood and Affect: Mood normal.        Behavior: Behavior normal.    Palliative Assessment/Data: 40-50%   Assessment & Plan:   Impression: Present on Admission:  Intractable abdominal pain  GERD (gastroesophageal reflux disease)  Class 2 obesity  Colon cancer metastasized to multiple sites ([facility])  Nausea and vomiting  Leukopenia  Hypokalemia  56year old male with abdominal pain secondary to small bowel obstruction and peritoneal metastasis from metastatic colon cancer and carcinomatosis.  Presented with  nausea and vomiting which seems to be better controlled.  He is expressed he is not ready for hospice, oncology on board with 1 more cycle of chemotherapy and plan to continue TPN.  Adjustments made to PCA for improved pain management, pain appears to be better managed today.  Overall long-term prognosis poor.  SUMMARY OF RECOMMENDATIONS   Continue same PCA dosing as per below PMT will continue to follow  Symptom Management:  Continue Dilaudid: Basal rate of 0.5 mg/h, bolus dose 1 mg every 15 minutes as needed, max hourly dose 4.5 mg/hr Continue  Fentanyl patch 200 mcg/hr (72 hour patch)  Code Status: Limited code  Prognosis: Unable to determine  Discharge Planning: To Be Determined  Discussed with: Patient, medical team, nursing team  Thank you for allowing [place] to participate in the care of Ricardo [name] PMT will continue to support holistically.  MDM: Acute or chronic illness posing threat to life or bodily function, discussion of management plan with medical team and nursing team, ongoing management of parenteral opioids.  Loistine Chance MD.  Palliative Medicine Team  Team Phone # [phone]

## 2021-10-04 NOTE — Progress Notes (Signed)
PROGRESS NOTE    Ricardo [name]  OQH:[id number] DOB: 1965-11-25 DOA: 09/30/2021 PCP: [name], MD   Brief Narrative:  56 y.o. 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   ED Course: Initial vital signs were temperature 99.1 F, pulse 133, respiration 20, BP 138/91 mmHg O2 sat 100% on room air.  The patient received 2000 mL of LR bolus hydromorphone 2 mg IVP x1 and Compazine 10 mg IVP x1.   Lab work: CBC is her white count 2.2, hemoglobin 13.5 g/dL platelets 252.  CMP with potassium of 3.2 and chloride 97 mmol/L.  LFTs were normal SF4 and albumin of 3.0 g/dL.  Glucose 113, BUN 34 and creatinine 1.12 mg/dL.   Review of Systems: As per HPI otherwise all other systems reviewed and are negative.   Subjective: 1/21 afebrile overnight A/O x4, pain slightly better controlled.  Increased postprandial pain.   Assessment & Plan:  Covid vaccination;  Principal Problem:   Intractable abdominal pain Active Problems:   Nausea and vomiting   GERD (gastroesophageal reflux disease)   Colon cancer metastasized to multiple sites [facility])   Class 2 obesity   Leukopenia   Hypokalemia  Colon cancer metastasized to multiple sites ([place]) -Poor overall prognosis. -1/18 spoke with Dr. Burr Medico.  And she is aware that patient has been rehospitalized.  States will come I see patient today.  Will await any additional recommendations -1/19 hematology oncology recommendation: -The patient is not yet ready for hospice.   We are willing to give him 1 more cycle of chemotherapy.  PICC line has been placed.  Timing of chemo per Dr. Burr Medico.  -He has not yet started his home TPN.  PICC line has been placed.  TPN order set  has been placed. -Goals of treatment are for palliation of symptoms.  He understands that his  disease is not curable.  He agrees to limited code to include CPR and ACLS  medications, but no defibrillation or cardioversion or mechanical ventilation   Intractable abdominal pain -Patient pain controlled on PCA pump + fentanyl patch. - Had a long conversation with patient concerning his goals for discharge.  Patient does not want care team to give up on him though he understands that serious condition. - 1/19 increase Fentanyl patch to 200 mcg/hr.  Due for change on 1/19. - 1/19 have decreased Dilaudid per PCA pump -We will await any additional recommendations from Dr. Burr Medico oncology -1/20 we will continue to work with palliative care on optimizing patient's pain regimen.    Nausea and vomiting - Per patient mostly resolved.  Requested to start consuming p.o. - Currently continue n.p.o. -1/19 clear liquid diet  Moderate protein malnutrition ([place]) -1/18 awaiting placement of PICC line by IR, IV team unsuccessful - Restart TPN per pharmacy as soon as PICC line placed. -1/19 TPN should be restarted on 1/20 as PICC line was replaced late today. -1/20 TPN started  Hypokalemia -Potassium goal> 4  Acute Diastolic CHF - 5/46 echocardiogram consistent with acute diastolic failure. - Strict in and out -3.6 L - Daily  weight Filed Weights   09/30/21 2035 10/03/21 0500 10/04/21 0421  Weight: 117 kg 102.7 kg 101 kg     Sinus tachycardia -Continue IV fluids.   -Metoprolol 12.5 mg BID -1/19 currently sinus tachycardia, if continues overnight will increase Metoprolol    GERD (gastroesophageal reflux disease) -Pantoprazole has been increased to 40 mg IVP BID.   GAD (generalized anxiety  disorder) -Continue alprazolam as needed. -Parenteral lorazepam as needed if unable to PO. -1/19 Cymbalta 30 mg daily (should also help with pain)  Class 2 obesity    Normocytic anemia -1/19 anemia panel; consistent with anemia of chronic disease - Monitor H/H  Lab Results  Component Value Date   HGB 13.0 10/04/2021   HGB 13.2 10/03/2021   HGB 12.5 (L) 10/01/2021   HGB 13.5 09/30/2021   HGB 11.1 (L) 09/17/2021   AKI - 1/21 new onset AKI secondary to TPN - 1/21 D5W 81ml/hr Lab Results  Component Value Date   CREATININE 1.27 (H) 10/04/2021   CREATININE 1.32 (H) 10/03/2021   CREATININE 1.34 (H) 10/01/2021   CREATININE 1.12 09/30/2021   CREATININE 0.78 09/24/2021  ]    DVT prophylaxis: Lovenox per pharmacy Code Status: Partial Family Communication:  Status is: Inpatient    Dispo: The patient is from: Home              Anticipated d/c is to: Home              Anticipated d/c date is: 2 days              Patient currently is not medically stable to d/c.      Consultants:  Palliative care IR Oncology Dr. Burr Medico    Procedures/Significant Events:  1/18 Echocardiogram Left Ventricle: Left ventricular ejection fraction, by estimation, is 65  to 70%. The left ventricle has normal function. The left ventricle has no  regional wall motion abnormalities. Definity contrast agent was given IV  to delineate the left ventricular   endocardial borders. The left ventricular internal cavity size was normal  in size. There is no left ventricular hypertrophy. Left ventricular  diastolic parameters are consistent with Grade I diastolic dysfunction  (impaired relaxation). Normal left  ventricular filling pressure.     I have personally reviewed and interpreted all radiology studies and my findings are as above.  VENTILATOR SETTINGS: Room air 1/21 SPO2 96%  Cultures   Antimicrobials:    Devices    LINES / TUBES:  Dual lumen PowerPICC placed in left brachial  vein 1/19>>>>    Continuous Infusions:  lactated ringers 55 mL/hr at 10/04/21 1229   lactated ringers     TPN ADULT (ION) 60 mL/hr at 10/04/21 0312   TPN ADULT (ION)       Objective: Vitals:   10/04/21 0421 10/04/21 0620 10/04/21 0810 10/04/21 1231  BP:  123/90    Pulse:  (!) 103    Resp:  18 16 16   Temp:  97.6 F (36.4 C)    TempSrc:  Oral    SpO2:  96% 96% (!) 44%  Weight: 101 kg     Height:        Intake/Output Summary (Last 24 hours) at 10/04/2021 1348 Last data filed at 10/04/2021 0418 Gross per 24 hour  Intake 3150.55 ml  Output 1575 ml  Net 1575.55 ml    Filed Weights   09/30/21 2035 10/03/21 0500 10/04/21 0421  Weight: 117 kg 102.7 kg 101 kg    Examination:  General: A/O x4, No acute respiratory distress Eyes: negative scleral hemorrhage, negative anisocoria, negative icterus ENT: Negative Runny nose, negative gingival bleeding, Neck:  Negative scars, masses, torticollis, lymphadenopathy, JVD Lungs: Clear to auscultation bilaterally without wheezes or crackles Cardiovascular: Regular rate and rhythm without murmur gallop or rub normal S1 and S2 Abdomen: negative abdominal pain, nondistended, negative soft, bowel sounds, no rebound, no ascites, no appreciable mass Extremities: LEFT arm PICC in place, covered and clean No significant cyanosis, clubbing, or edema bilateral lower extremities Skin: Negative rashes, lesions, ulcers Psychiatric:  Negative depression, negative anxiety, negative fatigue, negative mania  Central nervous system:  Cranial nerves II through XII intact, tongue/uvula midline, all extremities muscle strength 5/5, sensation intact throughout, negative dysarthria, negative expressive aphasia, negative receptive aphasia.  .     Data Reviewed: Care during the described time interval was provided by me .  I have reviewed this patient's available data, including medical history, events of note, physical examination, and all test results as  part of my evaluation.   CBC: Recent Labs  Lab 09/30/21 0830 10/01/21 0940 10/03/21 0340 10/04/21 0352  WBC 2.2* 2.8* 7.0 8.8  NEUTROABS 1.1* 1.2* 4.1 5.6  HGB 13.5 12.5* 13.2 13.0  HCT 41.2 39.7 40.7 40.6  MCV 86.6 90.0 86.6 86.9  PLT 252 271 350 235    Basic Metabolic Panel: Recent Labs  Lab 09/30/21 0830 10/01/21 0940 10/03/21 0340 10/04/21 0352  NA 138 139 134* 135  K 3.2* 4.6 3.8 3.9  CL 97* 97* 93* 92*  CO2 31 33* 33* 33*  GLUCOSE 113* 117* 146* 174*  BUN 34* 29* 28* 31*  CREATININE 1.12 1.34* 1.32* 1.27*  CALCIUM 8.5* 8.6* 8.5* 8.9  MG 2.2 2.6* 2.3 2.3  PHOS 3.0 4.6 4.0 4.5    GFR: Estimated Creatinine Clearance: 78.6 mL/min (A) (by C-G formula based on SCr of 1.27 mg/dL (H)). Liver Function Tests: Recent Labs  Lab 09/30/21 0830 10/01/21 0940 10/03/21 0340 10/04/21 0352  AST 21 24 23 20   ALT 21 24 21 21   ALKPHOS 120 111 98 103  BILITOT 1.2 1.4* 1.3* 1.1  PROT 7.5 7.3 7.5 7.4  ALBUMIN 3.0* 2.8* 2.6* 2.6*    Recent Labs  Lab 09/30/21 0830  LIPASE 33    No results for input(s): AMMONIA in the last 168 hours. Coagulation Profile: No results for input(s): INR, PROTIME in the last 168 hours. Cardiac Enzymes: No results for input(s): CKTOTAL, CKMB, CKMBINDEX, TROPONINI in the last 168 hours. BNP (last 3 results) No results for input(s): PROBNP in the last 8760 hours. HbA1C: No results for input(s): HGBA1C in the last 72 hours. CBG: Recent Labs  Lab 10/02/21 2155 10/03/21 0635 10/03/21 1453 10/03/21 2157 10/04/21 0701  GLUCAP 124* 148* 128* 143* 139*    Lipid Profile: Recent Labs    10/03/21 0340  TRIG 129    Thyroid Function Tests: No results for input(s): TSH, T4TOTAL, FREET4, T3FREE, THYROIDAB in the last 72 hours. Anemia Panel: Recent Labs    10/02/21 0337  VITAMINB12 1,418*  FOLATE 28.8  FERRITIN 2,480*  TIBC 168*  IRON 29*  RETICCTPCT 1.6    Urine analysis:    Component Value Date/Time   COLORURINE YELLOW  09/03/2021 0800   APPEARANCEUR CLEAR 09/03/2021 0800   LABSPEC >1.046 (H) 09/03/2021 0800   PHURINE 5.0 09/03/2021 0800   GLUCOSEU NEGATIVE 09/03/2021 0800   HGBUR NEGATIVE 09/03/2021 0800   BILIRUBINUR NEGATIVE 09/03/2021 0800   KETONESUR NEGATIVE 09/03/2021 0800  PROTEINUR NEGATIVE 09/03/2021 0800   NITRITE NEGATIVE 09/03/2021 0800   LEUKOCYTESUR NEGATIVE 09/03/2021 0800   Sepsis Labs: @LABRCNTIP (procalcitonin:4,lacticidven:4)  )No results found for this or any previous visit (from the past 240 hour(s)).       Radiology Studies: No results found.      Scheduled Meds:  Chlorhexidine Gluconate Cloth  6 each Topical Daily   dexamethasone (DECADRON) injection  4 mg Intravenous Q24H   DULoxetine  30 mg Oral Daily   enoxaparin (LOVENOX) injection  50 mg Subcutaneous Q24H   fentaNYL  1 patch Transdermal Q72H   And   fentaNYL  1 patch Transdermal Q72H   HYDROmorphone   Intravenous Q4H   insulin aspart  0-9 Units Subcutaneous Q8H   metoprolol tartrate  12.5 mg Oral BID   pantoprazole (PROTONIX) IV  40 mg Intravenous Q12H   sodium chloride flush  10-40 mL Intracatheter Q12H   Continuous Infusions:  lactated ringers 55 mL/hr at 10/04/21 1229   lactated ringers     TPN ADULT (ION) 60 mL/hr at 10/04/21 2035   TPN ADULT (ION)       LOS: 3 days   The patient is critically ill with multiple organ systems failure and requires high complexity decision making for assessment and support, frequent evaluation and titration of therapies, application of advanced monitoring technologies and extensive interpretation of multiple databases. Critical Care Time devoted to patient care services described in this note  Time spent: 40 minutes     Alesandro Stueve, Geraldo Docker, MD [facility]   If 7PM-7AM, please contact night-coverage 10/04/2021, 1:48 PM

## 2021-10-05 DIAGNOSIS — K2100 Gastro-esophageal reflux disease with esophagitis, without bleeding: Secondary | ICD-10-CM | POA: Diagnosis not present

## 2021-10-05 DIAGNOSIS — R109 Unspecified abdominal pain: Secondary | ICD-10-CM | POA: Diagnosis not present

## 2021-10-05 DIAGNOSIS — C189 Malignant neoplasm of colon, unspecified: Secondary | ICD-10-CM | POA: Diagnosis not present

## 2021-10-05 DIAGNOSIS — E669 Obesity, unspecified: Secondary | ICD-10-CM | POA: Diagnosis not present

## 2021-10-05 LAB — COMPREHENSIVE METABOLIC PANEL
ALT: 30 U/L (ref 0–44)
AST: 25 U/L (ref 15–41)
Albumin: 3.1 g/dL — ABNORMAL LOW (ref 3.5–5.0)
Alkaline Phosphatase: 131 U/L — ABNORMAL HIGH (ref 38–126)
Anion gap: 11 (ref 5–15)
BUN: 37 mg/dL — ABNORMAL HIGH (ref 6–20)
CO2: 31 mmol/L (ref 22–32)
Calcium: 9.2 mg/dL (ref 8.9–10.3)
Chloride: 90 mmol/L — ABNORMAL LOW (ref 98–111)
Creatinine, Ser: 1.24 mg/dL (ref 0.61–1.24)
GFR, Estimated: >60 mL/min (ref >60)
Glucose, Bld: 127 mg/dL — ABNORMAL HIGH (ref 70–99)
Potassium: 3.7 mmol/L (ref 3.5–5.1)
Sodium: 132 mmol/L — ABNORMAL LOW (ref 135–145)
Total Bilirubin: 1.4 mg/dL — ABNORMAL HIGH (ref 0.3–1.2)
Total Protein: 8.6 g/dL — ABNORMAL HIGH (ref 6.5–8.1)

## 2021-10-05 LAB — GLUCOSE, CAPILLARY
Glucose-Capillary: 120 mg/dL — ABNORMAL HIGH (ref 70–99)
Glucose-Capillary: 149 mg/dL — ABNORMAL HIGH (ref 70–99)
Glucose-Capillary: 142 mg/dL — ABNORMAL HIGH (ref 70–99)
Glucose-Capillary: 141 mg/dL — ABNORMAL HIGH (ref 70–99)

## 2021-10-05 LAB — CBC WITH DIFFERENTIAL/PLATELET
Abs Immature Granulocytes: 1.64 10*3/uL — ABNORMAL HIGH (ref 0.00–0.07)
Basophils Absolute: 0 10*3/uL (ref 0.0–0.1)
Basophils Relative: 0 %
Eosinophils Absolute: 0 10*3/uL (ref 0.0–0.5)
Eosinophils Relative: 0 %
HCT: 44.5 % (ref 39.0–52.0)
Hemoglobin: 14.3 g/dL (ref 13.0–17.0)
Immature Granulocytes: 14 %
Lymphocytes Relative: 7 %
Lymphs Abs: 0.8 10*3/uL (ref 0.7–4.0)
MCH: 28.1 pg (ref 26.0–34.0)
MCHC: 32.1 g/dL (ref 30.0–36.0)
MCV: 87.4 fL (ref 80.0–100.0)
Monocytes Absolute: 1 10*3/uL (ref 0.1–1.0)
Monocytes Relative: 9 %
Neutro Abs: 8.4 10*3/uL — ABNORMAL HIGH (ref 1.7–7.7)
Neutrophils Relative %: 70 %
Platelets: 404 10*3/uL — ABNORMAL HIGH (ref 150–400)
RBC: 5.09 MIL/uL (ref 4.22–5.81)
RDW: 14.9 % (ref 11.5–15.5)
WBC: 11.9 10*3/uL — ABNORMAL HIGH (ref 4.0–10.5)
nRBC: 0.2 % (ref 0.0–0.2)

## 2021-10-05 LAB — PHOSPHORUS: Phosphorus: 4.1 mg/dL (ref 2.5–4.6)

## 2021-10-05 LAB — MAGNESIUM: Magnesium: 2.2 mg/dL (ref 1.7–2.4)

## 2021-10-05 MED ORDER — TRAVASOL 10 % IV SOLN
INTRAVENOUS | Status: DC
  Filled 2021-10-05: qty 1322.4

## 2021-10-05 MED ORDER — INSULIN ASPART 100 UNIT/ML IJ SOLN
0.0000 [IU] | INTRAMUSCULAR | Status: AC
  Administered 2021-10-05 – 2021-10-07 (×5): 1 [IU] via SUBCUTANEOUS
  Administered 2021-10-07: 01:00:00 2 [IU] via SUBCUTANEOUS

## 2021-10-05 MED ORDER — TRAVASOL 10 % IV SOLN
INTRAVENOUS | Status: AC
  Filled 2021-10-05: qty 1322.4

## 2021-10-05 MED ORDER — DEXTROSE 5 % IV SOLN: INTRAVENOUS | Status: AC

## 2021-10-05 NOTE — Progress Notes (Signed)
PROGRESS NOTE    Ricardo [name]  YHC:[id number] DOB: Feb 16, 1966 DOA: 09/30/2021 PCP: [name], MD   Brief Narrative:  56 y.o. 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   ED Course: Initial vital signs were temperature 99.1 F, pulse 133, respiration 20, BP 138/91 mmHg O2 sat 100% on room air.  The patient received 2000 mL of LR bolus hydromorphone 2 mg IVP x1 and Compazine 10 mg IVP x1.   Lab work: CBC is her white count 2.2, hemoglobin 13.5 g/dL platelets 252.  CMP with potassium of 3.2 and chloride 97 mmol/L.  LFTs were normal SF4 and albumin of 3.0 g/dL.  Glucose 113, BUN 34 and creatinine 1.12 mg/dL.   Review of Systems: As per HPI otherwise all other systems reviewed and are negative.   Subjective: 1/22 afebrile overnight A/O x4 states that cafeteria would not allow him to have applesauce and gelato which he had been eating the day before.  Feeling tired today.   Assessment & Plan:  Covid vaccination;  Principal Problem:   Intractable abdominal pain Active Problems:   Nausea and vomiting   GERD (gastroesophageal reflux disease)   Colon cancer metastasized to multiple sites [facility])   Class 2 obesity   Leukopenia   Hypokalemia  Colon cancer metastasized to multiple sites ([place]) -Poor overall prognosis. -1/18 spoke with Dr. Burr Medico.  And she is aware that patient has been rehospitalized.  States will come I see patient today.  Will await any additional recommendations -1/19 hematology  oncology recommendation: -The patient is not yet ready for hospice.  We are willing to give him 1 more cycle of chemotherapy.  PICC line has been placed.  Timing of chemo per Dr. Burr Medico.  -He has not yet started his home TPN.  PICC line has been placed.  TPN order set  has been placed. -Goals of treatment are for palliation of symptoms.  He understands that his  disease is not curable.  He agrees to limited code to include CPR and ACLS  medications, but no defibrillation or cardioversion or mechanical ventilation   Intractable abdominal pain -Patient pain controlled on PCA pump + fentanyl patch. - Had a long conversation with patient concerning his goals for discharge.  Patient does not want care team to give up on him though he understands that serious condition. - 1/19 increase Fentanyl patch to 200 mcg/hr.  Due for change on 1/19. - 1/19 have decreased Dilaudid per PCA pump -We will await any additional recommendations from Dr. Burr Medico oncology -1/20 we will continue to work with palliative care on optimizing patient's pain regimen.    Nausea and vomiting - Per patient mostly resolved.  Requested to start consuming p.o. - Currently continue n.p.o. - 1/22 full liquid diet  Moderate protein malnutrition ([place]) -1/18 awaiting placement of PICC line by IR, IV team unsuccessful - Restart TPN per pharmacy as soon as PICC line placed. -1/19 TPN should be restarted on 1/20 as PICC line was replaced late today. -1/20 TPN started  Hypokalemia -Potassium goal> 4  Acute Diastolic CHF -  1/18 echocardiogram consistent with acute diastolic failure. - Strict in and out -3.6 L - Daily weight Filed Weights   09/30/21 2035 10/03/21 0500 10/04/21 0421  Weight: 117 kg 102.7 kg 101 kg     Sinus tachycardia -Continue IV fluids.   -Metoprolol 12.5 mg BID -1/19 currently sinus tachycardia, if continues overnight will increase Metoprolol    GERD (gastroesophageal reflux disease) -Pantoprazole has  been increased to 40 mg IVP BID.   GAD (generalized anxiety disorder) -Continue alprazolam as needed. -Parenteral lorazepam as needed if unable to PO. -1/19 Cymbalta 30 mg daily (should also help with pain)  Class 2 obesity    Normocytic anemia -1/19 anemia panel; consistent with anemia of chronic disease - Monitor H/H  Lab Results  Component Value Date   HGB 14.3 10/05/2021   HGB 13.0 10/04/2021   HGB 13.2 10/03/2021   HGB 12.5 (L) 10/01/2021   HGB 13.5 09/30/2021   AKI - 1/21 new onset AKI secondary to TPN - 1/21 D5W 30ml/hr Lab Results  Component Value Date   CREATININE 1.24 10/05/2021   CREATININE 1.27 (H) 10/04/2021   CREATININE 1.32 (H) 10/03/2021   CREATININE 1.34 (H) 10/01/2021   CREATININE 1.12 09/30/2021  ]    DVT prophylaxis: Lovenox per pharmacy Code Status: Partial Family Communication: 1/22 wife at bedside for discussion of plan of care all questions answered Status is: Inpatient    Dispo: The patient is from: Home              Anticipated d/c is to: Home              Anticipated d/c date is: 2 days              Patient currently is not medically stable to d/c.      Consultants:  Palliative care IR Oncology Dr. Burr Medico    Procedures/Significant Events:  1/18 Echocardiogram Left Ventricle: Left ventricular ejection fraction, by estimation, is 65  to 70%. The left ventricle has normal function. The left ventricle has no  regional wall motion abnormalities. Definity contrast agent was given IV  to delineate the left ventricular   endocardial borders. The left ventricular internal cavity size was normal  in size. There is no left ventricular hypertrophy. Left ventricular  diastolic parameters are consistent with Grade I diastolic dysfunction  (impaired relaxation). Normal left  ventricular filling pressure.     I have personally reviewed and interpreted all radiology studies and my findings are as above.  VENTILATOR SETTINGS: Room air  1/21 SPO2 96%  Cultures   Antimicrobials:    Devices    LINES / TUBES:  Dual lumen PowerPICC placed in left brachial vein 1/19>>>>    Continuous Infusions:  dextrose 75 mL/hr at 10/05/21 0551   TPN ADULT (ION) 95 mL/hr at 08/65/78 4696   TPN CYCLIC-ADULT (ION)       Objective: Vitals:   10/04/21 2206 10/05/21 0018 10/05/21 0505 10/05/21 0849  BP: 115/73  121/87   Pulse: (!) 107  98   Resp: 18 16 16 16   Temp:   98 F (36.7 C)   TempSrc:   Oral   SpO2: 96%  97% 96%  Weight:      Height:        Intake/Output Summary (Last 24 hours) at 10/05/2021 1128 Last data filed at 10/05/2021 0648 Gross per 24 hour  Intake 2761.29 ml  Output 4700 ml  Net -1938.71 ml    [employer]  09/30/21 2035 10/03/21 0500 10/04/21 0421  Weight: 117 kg 102.7 kg 101 kg    Examination:  General: A/O x4, No acute respiratory distress Eyes: negative scleral hemorrhage, negative anisocoria, negative icterus ENT: Negative Runny nose, negative gingival bleeding, Neck:  Negative scars, masses, torticollis, lymphadenopathy, JVD Lungs: Clear to auscultation bilaterally without wheezes or crackles Cardiovascular: Regular rate and rhythm without murmur gallop or rub normal S1 and S2 Abdomen: negative abdominal pain, nondistended, negative soft, bowel sounds, no rebound, no ascites, no appreciable mass Extremities: LEFT arm PICC in place, covered and clean No significant cyanosis, clubbing, or edema bilateral lower extremities Skin: Negative rashes, lesions, ulcers Psychiatric:  Negative depression, negative anxiety, negative fatigue, negative mania  Central nervous system:  Cranial nerves II through XII intact, tongue/uvula midline, all extremities muscle strength 5/5, sensation intact throughout, negative dysarthria, negative expressive aphasia, negative receptive aphasia.  .     Data Reviewed: Care during the described time interval was provided by me .  I have reviewed this patient's  available data, including medical history, events of note, physical examination, and all test results as part of my evaluation.   CBC: Recent Labs  Lab 09/30/21 0830 10/01/21 0940 10/03/21 0340 10/04/21 0352 10/05/21 0547  WBC 2.2* 2.8* 7.0 8.8 11.9*  NEUTROABS 1.1* 1.2* 4.1 5.6 8.4*  HGB 13.5 12.5* 13.2 13.0 14.3  HCT 41.2 39.7 40.7 40.6 44.5  MCV 86.6 90.0 86.6 86.9 87.4  PLT 252 271 350 380 404*    Basic Metabolic Panel: Recent Labs  Lab 09/30/21 0830 10/01/21 0940 10/03/21 0340 10/04/21 0352 10/05/21 0547  NA 138 139 134* 135 132*  K 3.2* 4.6 3.8 3.9 3.7  CL 97* 97* 93* 92* 90*  CO2 31 33* 33* 33* 31  GLUCOSE 113* 117* 146* 174* 127*  BUN 34* 29* 28* 31* 37*  CREATININE 1.12 1.34* 1.32* 1.27* 1.24  CALCIUM 8.5* 8.6* 8.5* 8.9 9.2  MG 2.2 2.6* 2.3 2.3 2.2  PHOS 3.0 4.6 4.0 4.5 4.1    GFR: Estimated Creatinine Clearance: 80.5 mL/min (by C-G formula based on SCr of 1.24 mg/dL). Liver Function Tests: Recent Labs  Lab 09/30/21 0830 10/01/21 0940 10/03/21 0340 10/04/21 0352 10/05/21 0547  AST 21 24 23 20 25   ALT 21 24 21 21 30   ALKPHOS 120 111 98 103 131*  BILITOT 1.2 1.4* 1.3* 1.1 1.4*  PROT 7.5 7.3 7.5 7.4 8.6*  ALBUMIN 3.0* 2.8* 2.6* 2.6* 3.1*    Recent Labs  Lab 09/30/21 0830  LIPASE 33    No results for input(s): AMMONIA in the last 168 hours. Coagulation Profile: No results for input(s): INR, PROTIME in the last 168 hours. Cardiac Enzymes: No results for input(s): CKTOTAL, CKMB, CKMBINDEX, TROPONINI in the last 168 hours. BNP (last 3 results) No results for input(s): PROBNP in the last 8760 hours. HbA1C: No results for input(s): HGBA1C in the last 72 hours. CBG: Recent Labs  Lab 10/04/21 0701 10/04/21 1412 10/04/21 2237 10/05/21 0546 10/05/21 0757  GLUCAP 139* 138* 127* 120* 149*    Lipid Profile: Recent Labs    10/03/21 0340  TRIG 129    Thyroid Function Tests: No results for input(s): TSH, T4TOTAL, FREET4, T3FREE,  THYROIDAB in the last 72 hours. Anemia Panel: No results for input(s): VITAMINB12, FOLATE, FERRITIN, TIBC, IRON, RETICCTPCT in the last 72 hours.  Urine analysis:    Component Value Date/Time   COLORURINE YELLOW 09/03/2021 0800   APPEARANCEUR CLEAR 09/03/2021 0800   LABSPEC >1.046 (H)  09/03/2021 0800   PHURINE 5.0 09/03/2021 0800   GLUCOSEU NEGATIVE 09/03/2021 0800   HGBUR NEGATIVE 09/03/2021 0800   BILIRUBINUR NEGATIVE 09/03/2021 0800   KETONESUR NEGATIVE 09/03/2021 0800   PROTEINUR NEGATIVE 09/03/2021 0800   NITRITE NEGATIVE 09/03/2021 0800   LEUKOCYTESUR NEGATIVE 09/03/2021 0800   Sepsis Labs: @LABRCNTIP (procalcitonin:4,lacticidven:4)  )No results found for this or any previous visit (from the past 240 hour(s)).       Radiology Studies: No results found.      Scheduled Meds:  Chlorhexidine Gluconate Cloth  6 each Topical Daily   dexamethasone (DECADRON) injection  4 mg Intravenous Q24H   DULoxetine  30 mg Oral Daily   enoxaparin (LOVENOX) injection  50 mg Subcutaneous Q24H   fentaNYL  1 patch Transdermal Q72H   And   fentaNYL  1 patch Transdermal Q72H   HYDROmorphone   Intravenous Q4H   insulin aspart  0-9 Units Subcutaneous 4 times per day   metoprolol tartrate  12.5 mg Oral BID   pantoprazole (PROTONIX) IV  40 mg Intravenous Q12H   sodium chloride flush  10-40 mL Intracatheter Q12H   Continuous Infusions:  dextrose 75 mL/hr at 10/05/21 0551   TPN ADULT (ION) 95 mL/hr at 32/76/14 7092   TPN CYCLIC-ADULT (ION)       LOS: 4 days   The patient is critically ill with multiple organ systems failure and requires high complexity decision making for assessment and support, frequent evaluation and titration of therapies, application of advanced monitoring technologies and extensive interpretation of multiple databases. Critical Care Time devoted to patient care services described in this note  Time spent: 40 minutes     Aedon Deason, Geraldo Docker, MD [facility]   If 7PM-7AM, please contact night-coverage 10/05/2021, 11:28 AM

## 2021-10-05 NOTE — Plan of Care (Signed)
  Problem: Pain Managment: Goal: General experience of comfort will improve Outcome: Progressing   Problem: Safety: Goal: Ability to remain free from injury will improve Outcome: Progressing   

## 2021-10-05 NOTE — Progress Notes (Signed)
Dr. Kathlene Cote paged and notified of tpa and continued lack of blood return.  Dr. Kathlene Cote stated, will get him on list for exchange tomorrow.

## 2021-10-05 NOTE — Progress Notes (Signed)
Daily Progress Note   Patient Name: Ricardo [name]       Date: 10/05/2021 DOB: 09-16-1965  Age: 56 y.o. MRN#: [id number] Attending Physician: Allie Bossier, MD Primary Care Physician: [name], MD Admit Date: 09/30/2021 Length of Stay: 4 days  Reason for Consultation/Follow-up: Establishing goals of care  HPI/Patient Profile:  56 y.o. male  with past medical history of osteoarthritis, class II obesity, generalized anxiety disorder, hyperlipidemia, unspecified sleep disorder, GERD, colon cancer with metastasis and peritoneal carcinomatosis who was recently admitted and discharged yesterday due to SBO requiring PEG placement for venting purposes    Again returns to the hospital due to recurrence of abdominal pain, multiple episodes of nausea, emesis, despite draining the PEG tube collecting bag 3-4 times yesterday similar to while he experience when he was admitted on the first of the month.   PMT was consulted for pain management.  Subjective:   Subjective: Chart Reviewed. Updates received. Patient Assessed. Created space and opportunity for patient  and family to explore thoughts and feelings regarding current medical situation.  Today's Discussion: Met with the patient and his wife at bedside today alongside nursing colleagues.   Overall, he continues to have  episodes of uncontrolled pain for which the bolus dosages are effective.  He states that he has visual hallucinations every time a new fentanyl patch is applied, it last for a day or so and then they subside. Hence, will not uptitrate his fentanyl patch and will monitor on PCA.    PCA pump also interrogated.  Total of 18.9 mg used, he states that he is trying to stay on top of demand bolus requests so as to better manage his pain.    Current regimen is basal rate 0.64m/hr and bolus dose 1 mg q 15 mins, in addition to transdermal fentanyl patch 200 mcg.  Review of Systems  Respiratory:  Negative for chest tightness.    Cardiovascular:  Negative for chest pain.  Gastrointestinal:  Positive for abdominal pain (improved). Negative for nausea and vomiting.   Objective:   Vital Signs:  BP 121/87 (BP Location: Right Arm)    Pulse 98    Temp 98 F (36.7 C) (Oral)    Resp 15    Ht _0  (1.803 m)    Wt 101 kg    SpO2 92%    BMI 31.05 kg/m   Physical Exam: Physical Exam Vitals and nursing note reviewed.  Constitutional:      General: He is not in acute distress.    Appearance: He is ill-appearing.  HENT:     Head: Normocephalic and atraumatic.  Cardiovascular:     Rate and Rhythm: Normal rate.  Pulmonary:     Effort: No respiratory distress.     Breath sounds: No wheezing or rhonchi.  Skin:    General: Skin is warm and dry.  Neurological:     General: No focal deficit present.     Mental Status: He is alert.  Psychiatric:        Mood and Affect: Mood normal.        Behavior: Behavior normal.    Palliative Assessment/Data: 40-50%   Assessment & Plan:   Impression: Present on Admission:  Intractable abdominal pain  GERD (gastroesophageal reflux disease)  Class 2 obesity  Colon cancer metastasized to multiple sites (HCC)  Nausea and vomiting  Leukopenia  Hypokalemia  56year old male with abdominal pain secondary to small bowel obstruction and peritoneal metastasis from metastatic colon cancer  and carcinomatosis.  Presented with nausea and vomiting which seems to be better controlled.  He is expressed he is not ready for hospice, oncology on board with 1 more cycle of chemotherapy and plan to continue TPN.  Adjustments made to PCA for improved pain management, pain appears to be better managed today.  Overall long-term prognosis poor.  SUMMARY OF RECOMMENDATIONS   Continue same PCA dosing as per below PMT will continue to follow  Symptom Management:  Continue Dilaudid: Basal rate of 0.5 mg/h, bolus dose 1 mg every 15 minutes as needed, max hourly dose 4.5 mg/hr Continue Fentanyl patch  200 mcg/hr (72 hour patch)  Code Status: Limited code  Prognosis: Unable to determine  Discharge Planning: To Be Determined  Discussed with: Patient, medical team, nursing team  Thank you for allowing [place] to participate in the care of Ricardo [name] PMT will continue to support holistically.  MDM: Acute or chronic illness posing threat to life or bodily function, discussion of management plan with medical team and nursing team, ongoing management of parenteral opioids.  Loistine Chance MD.  Palliative Medicine Team  Team Phone # [phone]

## 2021-10-05 NOTE — Progress Notes (Signed)
PHARMACY - TOTAL PARENTERAL NUTRITION CONSULT NOTE   Indication: Small bowel obstruction  Patient Measurements: Height: 5\' 11"  (180.3 cm) Weight: 101 kg (222 lb 9.6 oz) IBW/kg (Calculated) : 75.3 TPN AdjBW (KG): 85.7 Body mass index is 31.05 kg/m. Usual Weight: Weight was stable 06/19/21-09/14/21 and is now -32 lb since 09/14/21  Assessment:  2 yoM with PMH metastatic colon cancer with peritoneal carcinomatosis with recent admissions for SBO readmitted 1/17 for same. Venting PEG recently placed, but continues to have abdominal pain despite draining collection bag 3-4x daily. PICC line palced 1/19 d/t difficulty accessing implanted chemo port.  Glucose / Insulin: No hx DM. Hgb A1c 5.5% on 09/14/21 - CBG range 127-149, 3 units SSI required - On dexamethasone 4mg  IV q24h Electrolytes: All wnl; chloride low, CO2 elevated with max chloride in TPN Renal:  SCr elevated since previous admission; BUN elevated but trending down; bicarb elevated and rising; UOP remains excellent Hepatic: Tbili decreased into normal range; Albumin low; otherwise LFTs WNL Trig: wnl (129 (1/20) Intake / Output; MIVF:  >2L daily from venting PEG - UOP 1620ml, 3 occurrences  - MIVF: LR changed to D5 at 60ml/hr on 1/21 GI Imaging: - 1/17: persistent SBO GI Surgeries / Procedures:  - Venting PEG placed 12/29  Central access: double lumen PICC in L brachial vein placed 1/19 in IR TPN start date: 1/19  Home TPN formula received from [facility]: 2300 ml over 24hr with one hour taper up and down provides protein 125g/day, 2225 kcal/day.  Nutritional Goals: Goal TPN rate is 95 mL/hr (provides 132 g of protein and 2453 kcals per day)  RD Assessment: 1/20 Estimated Needs Total Energy Estimated Needs: 2365-2600 kcal Total Protein Estimated Needs: 120-135 grams Total Fluid Estimated Needs: >/= 2.5 L/day  Current Nutrition:  Clear liquids TPN at 95 ml/hr  Plan:  Tolerating TPN at goal rate, will transition back to  cyclic administration over 18hr. Can taper down further if tolerates  Electrolytes in TPN:  Na 1103mEq/L (increase),  K 43mEq/L,  Ca 25mEq/L,  Mg 64mEq/L,  Phos 51mmol/L.  Cl:Ac - MAX Cl Add standard MVI and trace elements to TPN Sensitive SSI - adjust times to 2hr after starting, 1 hr after stopping, once in the middle of infusion and once while off MIVF D5 at 32ml/hr while TPN per MD Monitor TPN labs on Mon/Thurs  Peggyann Juba, PharmD, BCPS Pharmacy: [phone] 10/05/2021,9:21 AM

## 2021-10-06 ENCOUNTER — Inpatient Hospital Stay (HOSPITAL_COMMUNITY): Payer: BC Managed Care – PPO

## 2021-10-06 DIAGNOSIS — E669 Obesity, unspecified: Secondary | ICD-10-CM | POA: Diagnosis not present

## 2021-10-06 DIAGNOSIS — C189 Malignant neoplasm of colon, unspecified: Secondary | ICD-10-CM | POA: Diagnosis not present

## 2021-10-06 DIAGNOSIS — K2100 Gastro-esophageal reflux disease with esophagitis, without bleeding: Secondary | ICD-10-CM | POA: Diagnosis not present

## 2021-10-06 DIAGNOSIS — R109 Unspecified abdominal pain: Secondary | ICD-10-CM | POA: Diagnosis not present

## 2021-10-06 LAB — COMPREHENSIVE METABOLIC PANEL
ALT: 45 U/L — ABNORMAL HIGH (ref 0–44)
AST: 34 U/L (ref 15–41)
Albumin: 3 g/dL — ABNORMAL LOW (ref 3.5–5.0)
Alkaline Phosphatase: 156 U/L — ABNORMAL HIGH (ref 38–126)
Anion gap: 10 (ref 5–15)
BUN: 39 mg/dL — ABNORMAL HIGH (ref 6–20)
CO2: 27 mmol/L (ref 22–32)
Calcium: 8.9 mg/dL (ref 8.9–10.3)
Chloride: 93 mmol/L — ABNORMAL LOW (ref 98–111)
Creatinine, Ser: 1.2 mg/dL (ref 0.61–1.24)
GFR, Estimated: >60 mL/min (ref >60)
Glucose, Bld: 115 mg/dL — ABNORMAL HIGH (ref 70–99)
Potassium: 3.3 mmol/L — ABNORMAL LOW (ref 3.5–5.1)
Sodium: 130 mmol/L — ABNORMAL LOW (ref 135–145)
Total Bilirubin: 1.1 mg/dL (ref 0.3–1.2)
Total Protein: 8.2 g/dL — ABNORMAL HIGH (ref 6.5–8.1)

## 2021-10-06 LAB — GLUCOSE, CAPILLARY
Glucose-Capillary: 141 mg/dL — ABNORMAL HIGH (ref 70–99)
Glucose-Capillary: 95 mg/dL (ref 70–99)
Glucose-Capillary: 106 mg/dL — ABNORMAL HIGH (ref 70–99)

## 2021-10-06 LAB — CBC WITH DIFFERENTIAL/PLATELET
Abs Immature Granulocytes: 0.8 10*3/uL — ABNORMAL HIGH (ref 0.00–0.07)
Basophils Absolute: 0 10*3/uL (ref 0.0–0.1)
Basophils Relative: 0 %
Eosinophils Absolute: 0 10*3/uL (ref 0.0–0.5)
Eosinophils Relative: 0 %
HCT: 45.1 % (ref 39.0–52.0)
Hemoglobin: 14.5 g/dL (ref 13.0–17.0)
Lymphocytes Relative: 7 %
Lymphs Abs: 0.9 10*3/uL (ref 0.7–4.0)
MCH: 27.9 pg (ref 26.0–34.0)
MCHC: 32.2 g/dL (ref 30.0–36.0)
MCV: 86.9 fL (ref 80.0–100.0)
Monocytes Absolute: 1.1 10*3/uL — ABNORMAL HIGH (ref 0.1–1.0)
Monocytes Relative: 8 %
Myelocytes: 6 %
Neutro Abs: 10.7 10*3/uL — ABNORMAL HIGH (ref 1.7–7.7)
Neutrophils Relative %: 79 %
Platelets: 411 10*3/uL — ABNORMAL HIGH (ref 150–400)
RBC: 5.19 MIL/uL (ref 4.22–5.81)
RDW: 14.9 % (ref 11.5–15.5)
WBC: 13.5 10*3/uL — ABNORMAL HIGH (ref 4.0–10.5)
nRBC: 0.1 % (ref 0.0–0.2)

## 2021-10-06 LAB — MAGNESIUM: Magnesium: 2.1 mg/dL (ref 1.7–2.4)

## 2021-10-06 LAB — PHOSPHORUS: Phosphorus: 2.8 mg/dL (ref 2.5–4.6)

## 2021-10-06 LAB — TRIGLYCERIDES: Triglycerides: 140 mg/dL

## 2021-10-06 MED ORDER — POTASSIUM CHLORIDE 10 MEQ/50ML IV SOLN
10.0000 meq | INTRAVENOUS | Status: AC
  Administered 2021-10-06 (×4): 10 meq via INTRAVENOUS
  Filled 2021-10-06 (×4): qty 50

## 2021-10-06 MED ORDER — LIDOCAINE HCL 1 % IJ SOLN
INTRAMUSCULAR | Status: AC
  Filled 2021-10-06: qty 20

## 2021-10-06 MED ORDER — TRAVASOL 10 % IV SOLN
INTRAVENOUS | Status: AC
  Filled 2021-10-06: qty 1299.6

## 2021-10-06 MED ORDER — FENTANYL 25 MCG/HR TD PT72
1.0000 | MEDICATED_PATCH | TRANSDERMAL | Status: DC
  Administered 2021-10-06: 1 via TRANSDERMAL
  Filled 2021-10-06: qty 1

## 2021-10-06 NOTE — Progress Notes (Signed)
Daily Progress Note   Patient Name: Ricardo [name]       Date: 10/06/2021 DOB: August 05, 1966  Age: 56 y.o. MRN#: [id number] Attending Physician: Allie Bossier, MD Primary Care Physician: [name], MD Admit Date: 09/30/2021 Length of Stay: 5 days  Reason for Consultation/Follow-up: Establishing goals of care  HPI/Patient Profile:  56 y.o. male  with past medical history of osteoarthritis, class II obesity, generalized anxiety disorder, hyperlipidemia, unspecified sleep disorder, GERD, colon cancer with metastasis and peritoneal carcinomatosis who was recently admitted and discharged yesterday due to SBO requiring PEG placement for venting purposes    Again returns to the hospital due to recurrence of abdominal pain, multiple episodes of nausea, emesis, despite draining the PEG tube collecting bag 3-4 times yesterday similar to while he experience when he was admitted on the first of the month.   PMT was consulted for pain management.  Subjective:   Subjective: Chart Reviewed. Updates received. Patient Assessed. Created space and opportunity for patient  and family to explore thoughts and feelings regarding current medical situation.  Today's Discussion: Met with the patient and his wife at bedside today    PCA was discontinued for some time today, patient with uncontrolled pain  Overall, he continues to have  episodes of uncontrolled pain for which the bolus dosages are effective.  He states that he has visual hallucinations every time a new fentanyl patch is applied, it last for a day or so and then they subside. Hence, will not uptitrate his fentanyl patch and will monitor on PCA.    PCA pump also interrogated.  Total of 17 mg used, he states that he is trying to stay on top of demand bolus requests so as to better manage his pain.    Current regimen is basal rate 0.67m/hr and bolus dose 1 mg q 15 mins, in addition to transdermal fentanyl patch 225 mcg.    Objective:    Vital Signs:  BP (!) 134/99 (BP Location: Left Arm)    Pulse (!) 110    Temp 98.4 F (36.9 C) (Oral)    Resp 16    Ht '5\' 11"'  (1.803 m)    Wt 101 kg    SpO2 93%    BMI 31.05 kg/m   Physical Exam: Physical Exam Vitals and nursing note reviewed.  Constitutional:      General: He is not in acute distress.    Appearance: He is ill-appearing.  HENT:     Head: Normocephalic and atraumatic.  Cardiovascular:     Rate and Rhythm: Normal rate.  Pulmonary:     Effort: No respiratory distress.     Breath sounds: No wheezing or rhonchi.  Skin:    General: Skin is warm and dry.  Neurological:     General: No focal deficit present.     Mental Status: He is alert.  Psychiatric:        Mood and Affect: Mood normal.        Behavior: Behavior normal.  +abdominal pain.   Palliative Assessment/Data: 40-50%   Assessment & Plan:   Impression: Present on Admission:  Intractable abdominal pain  GERD (gastroesophageal reflux disease)  Class 2 obesity  Colon cancer metastasized to multiple sites (HCC)  Nausea and vomiting  Leukopenia  Hypokalemia  56year old male with abdominal pain secondary to small bowel obstruction and peritoneal metastasis from metastatic colon cancer and carcinomatosis.  Presented with nausea and vomiting which seems to be better controlled.  He is  expressed he is not ready for hospice, oncology on board with 1 more cycle of chemotherapy and plan to continue TPN.  Adjustments made to PCA for improved pain management, pain appears to be better managed today.  Overall long-term prognosis poor.  SUMMARY OF RECOMMENDATIONS   Continue same PCA dosing as per below PMT will continue to follow  Symptom Management:  Continue Dilaudid: Basal rate of 0.5 mg/h, bolus dose 1 mg every 15 minutes as needed, max hourly dose 4.5 mg/hr Continue Fentanyl patch 225 mcg/hr (72 hour patch)  Code Status: Limited code  Prognosis: Unable to determine  Discharge Planning: To Be  Determined  Discussed with: Patient, medical team, nursing team  Thank you for allowing [place] to participate in the care of Ricardo [name] PMT will continue to support holistically.  MDM: Acute or chronic illness posing threat to life or bodily function, discussion of management plan with medical team and nursing team, ongoing management of parenteral opioids.  Loistine Chance MD.  Palliative Medicine Team  Team Phone # [phone]

## 2021-10-06 NOTE — Progress Notes (Addendum)
HEMATOLOGY-ONCOLOGY PROGRESS NOTE  ASSESSMENT AND PLAN: 1.  Abdominal pain secondary to small bowel obstruction and peritoneal metastasis 2.  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  Nausea and vomiting 4.  Hypokalemia 5.  GERD 6.  Protein calorie malnutrition 7.  Mild neutropenia secondary to recent chemotherapy 8.  Goals of care discussion  -He had increased abdominal pain this morning, but PCA was disconnected for a period of time.  He is now trying to get pain under better control with use of PCA.  Fentanyl patch was increased per hospitalist today. -It is difficult to fully evaluate him this morning given that he has uncontrolled pain, but did go without pain medication for a period of time.  I discussed with the patient's wife who was at the bedside that he will be evaluated later today by Dr. Burr Medico to determine if/when he can receive his second cycle of chemotherapy. -Palliative care is following and assisting with pain medication management. -Continue TPN. -Goals of treatment are for palliation of symptoms.  He understands that his disease is not curable.  He agrees to limited code to include CPR and ACLS medications, but no defibrillation or cardioversion or mechanical ventilation.  Will need to readdress he has not candidate for additional therapy.  Mikey Bussing, DNP, AGPCNP-BC, AOCNP  SUBJECTIVE: A little more sedated this morning.  However, he went down to IR to have PICC line evaluated due to no blood return.  There was a period of time when he was not connected to his Dilaudid PCA and his pain was out of control.  Therefore, he has been taking increased amount of pain medication to get his pain under better control.  Currently rates pain as a 6-7 out of 10.  He had some nausea and vomiting last night.  Bowels are not moving.  Oncology History Overview Note  Cancer Staging metastatic cecal cancer Staging form: Colon and Rectum, AJCC 8th Edition - Clinical  stage from 11/29/2020: Stage IVC (cTX, cN2, pM1c) - Signed by [name], MD on 12/04/2020 Stage prefix: Initial diagnosis Histologic grade (G): G3 Histologic grading system: 4 grade system    metastatic cecal cancer  11/27/2020 Imaging   CT Angio CAP  IMPRESSION: 1. Thickening of the terminal ileum with associated proximal mid to distal small bowel obstruction. Findings could be due to an ileitis versus malignancy. Nonspecific mesenteric edema could be due to engorgement versus metastases. No associated bowel perforation. Recommend endoscopy for further evaluation. 2. Indeterminate right lower quadrant lymphadenopathy. 3. Scattered colonic diverticulosis with no acute diverticulitis. 4. Stable right hepatic lobe subcentimeter hyperdensity likely represents a hepatic hemangioma. 5. No acute vascular abnormality. Aortic Atherosclerosis (ICD10-I70.0) - mild. 6. No acute intrathoracic abnormality.   11/28/2020 Imaging   CT AP  IMPRESSION: 1. There is masslike, circumferential thickening of the terminal ileum and cecal base near the ileocecal valve and abnormally enlarged lymph nodes in the right lower quadrant mesentery adjacent to the terminal ileum measuring up to 2.3 x 1.4 cm. 2. There is extensive omental and peritoneal nodularity and caking throughout the abdomen. 3. Findings are highly concerning for primary colon malignancy with nodal and peritoneal metastatic disease. 4. Small volume perihepatic and perisplenic ascites. 5. The small bowel is generally decompressed, with full some fluid-filled, nondistended loops throughout. There is transit of oral enteric contrast to the terminal ileum. No evidence of overt bowel obstruction at this time. Esophagogastric tube is position with tip and side port below the diaphragm. 6. Atelectasis or  consolidation of the dependent bilateral lung bases, new compared to prior examination.   Aortic Atherosclerosis (ICD10-I70.0).      11/29/2020 Surgery   EXPLORATORY LAPAROTOMY, PARTIAL BOWEL RESECTION, POSSIBLE OSTOMY CREATION, PERITIONEAL BIOPSY, INSERTION OF GASTROSTOMY TUBE by Dr [name]   11/29/2020 Initial Biopsy   FINAL MICROSCOPIC DIAGNOSIS:   AB. 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    COMMENT:   Immunohistochemistry (IHC) for CK20 and CDX-2 is strong and diffusely  positive.  CK7, TTF-1, Synaptophysin, Chromogranin and CD56 are  negative.  The immunophenotype is compatible with origin from the lower  gastrointestinal tract.  IHC for MMR will be reported separately.  Case  preliminarily discussed with Dr. [name] on 12/02/2020.   At the request of Dr. [name], [phone] was reviewed in retrospect.  Review of the submitted sections confirms the presence of acute  appendicitis.  No malignancy is identified.    11/29/2020 Cancer Staging   Staging form: Colon and Rectum, AJCC 8th Edition - Clinical stage from 11/29/2020: Stage IVC (cTX, cN2, pM1c) - Signed by [name], MD on 12/04/2020 Stage prefix: Initial diagnosis Histologic grade (G): G3 Histologic grading system: 4 grade system   12/04/2020 Initial Diagnosis   Cancer of ascending colon metastatic to intra-abdominal lymph node ([place])    Chemotherapy   FOLFOX q2weeks    12/18/2020 - 02/15/2021 Chemotherapy      Patient is on Antibody Plan: COLORECTAL BEVACIZUMAB Q14D    01/03/2021 Imaging   CT A/P IMPRESSION: 1. Wall thickening of the terminal ileum again seen. Fluid-filled distal small bowel, ascending, and transverse colon without obstruction. 2. Equivocal improvement in omental and peritoneal metastatic disease with slightly improved small volume perihepatic and perisplenic ascites. Right lower quadrant mesenteric adenopathy is similar or mildly improved. 3. Sigmoid colonic diverticulosis. Mild mural wall thickening in the sigmoid, no acute  diverticulitis. 4. Gastrostomy tube in place, balloon normally positioned in the stomach. 5. Chronic bilateral L5 pars interarticularis defects with trace anterolisthesis of L5 on S1. Chronic avascular necrosis of the left femoral head.   01/16/2021 Genetic Testing   Negative genetic testing. CTNNA1 [id number] VUS, RAD51D p.G140E VUS and TSC1 p.R768H VUS found on the CancerNext-Expanded+RNAinsight.  The CancerNext-Expanded gene panel offered by [facility] and includes sequencing and rearrangement analysis for the following 77 genes: AIP, ALK, APC*, ATM*, AXIN2, BAP1, BARD1, BLM, BMPR1A, BRCA1*, BRCA2*, BRIP1*, CDC73, CDH1*, CDK4, CDKN1B, CDKN2A, CHEK2*, CTNNA1, DICER1, FANCC, FH, FLCN, GALNT12, KIF1B, LZTR1, MAX, MEN1, MET, MLH1*, MSH2*, MSH3, MSH6*, MUTYH*, NBN, NF1*, NF2, NTHL1, PALB2*, PHOX2B, PMS2*, POT1, PRKAR1A, PTCH1, PTEN*, RAD51C*, RAD51D*, RB1, RECQL, RET, SDHA, SDHAF2, SDHB, SDHC, SDHD, SMAD4, SMARCA4, SMARCB1, SMARCE1, STK11, SUFU, TMEM127, TP53*, TSC1, TSC2, VHL and XRCC2 (sequencing and deletion/duplication); EGFR, EGLN1, HOXB13, KIT, MITF, PDGFRA, POLD1, and POLE (sequencing only); EPCAM and GREM1 (deletion/duplication only). DNA and RNA analyses performed for * genes. The report date is Jan 16, 2021.   02/24/2021 Imaging   CT AP  IMPRESSION: 1. Interval resolution of previously seen small volume ascites throughout the abdomen and pelvis. There is redemonstrated, ill-defined stranding and thickening of the peritoneal surfaces and omentum, which is somewhat diminished compared to prior examination. 2. Interval improvement in right lower quadrant mesocolon lymph nodes. 3. Findings are consistent with treatment response of nodal and peritoneal metastatic disease. 4. Unchanged, matted appearing thickening of the terminal ileum and cecal base. 5. Sigmoid diverticulosis with wall thickening of the mid to distal sigmoid,  similar to prior examination. Findings are most  suggestive of chronic sequelae of diverticulitis.   Aortic Atherosclerosis (ICD10-I70.0).   03/13/2021 -  Chemotherapy   Patient is on Treatment Plan : COLORECTAL FOLFOXIRI + Bevacizumab q14d     05/13/2021 Procedure   OPERATIVE PROCEDURE: Laparoscopy and peritoneal biopsy.  SURGEON: Merlyn Albert. Clovis Riley, MD   05/13/2021 Pathology Results   Final Pathologic Diagnosis    A. PERITONEAL, BIOPSY :              Metastatic adenocarcinoma.               See comment.    Comment    The biopsies show predominately fibrosis  and fibroadipose tissue with a small focus of moderately differentiated adenocarcinoma. Given the patient's history, the findings favor metastasis of the patient's known colonic cancer.     07/01/2021 Imaging   CT CAP  IMPRESSION: Chest Impression:   1. No evidence of thoracic metastasis.   Abdomen / Pelvis Impression:   1. Thickening through the terminal ileum similar to prior. No ileocecal lymphadenopathy. 2. One focus linear thickening the peritoneum adjacent to loops of small bowel in the ventral abdomen which extend to the proximal sigmoid colon are more prominent than prior and concerning for residual peritoneal carcinoma. 3. No evidence of solid organ metastasis in the abdomen pelvis.      REVIEW OF SYSTEMS:   Review of Systems  Constitutional:  Negative for chills and fever.  HENT: Negative.    Eyes: Negative.   Respiratory: Negative.    Cardiovascular: Negative.   Gastrointestinal:  Positive for abdominal pain. Negative for nausea and vomiting.       Abdomen pain well controlled at this time with Dilaudid PCA  Skin: Negative.   Neurological: Negative.   Endo/Heme/Allergies: Negative.   Psychiatric/Behavioral: Negative.     I have reviewed the past medical history, past surgical history, social history and family history with the patient and they are unchanged from previous note.   PHYSICAL EXAMINATION: ECOG PERFORMANCE STATUS: 2 - Symptomatic,  <50% confined to bed  Vitals:   10/06/21 0344 10/06/21 0930  BP:    Pulse:    Resp:  16  Temp:    SpO2: 90% 93%   Filed Weights   09/30/21 2035 10/03/21 0500 10/04/21 0421  Weight: 117 kg 102.7 kg 101 kg    Intake/Output from previous day: 01/22 0701 - 01/23 0700 In: 1809.4 [P.O.:240; I.V.:1569.4] Out: 480 [Drains:480]  Physical Exam Vitals reviewed.  Constitutional:      General: He is not in acute distress. HENT:     Head: Normocephalic.     Mouth/Throat:     Mouth: Mucous membranes are moist.     Pharynx: No oropharyngeal exudate.  Eyes:     General: No scleral icterus. Cardiovascular:     Rate and Rhythm: Normal rate and regular rhythm.     Pulses: Normal pulses.  Pulmonary:     Effort: Pulmonary effort is normal.     Breath sounds: Normal breath sounds.  Abdominal:     Comments: Mildly distended. No pain with palpation.   Skin:    General: Skin is warm and dry.  Neurological:     Mental Status: He is alert and oriented to person, place, and time.  Psychiatric:        Mood and Affect: Mood normal.        Behavior: Behavior normal.        Thought Content: Thought  content normal.        Judgment: Judgment normal.    LABORATORY DATA:  I have reviewed the data as listed CMP Latest Ref Rng & Units 10/06/2021 10/05/2021 10/04/2021  Glucose 70 - 99 mg/dL 115(H) 127(H) 174(H)  BUN 6 - 20 mg/dL 39(H) 37(H) 31(H)  Creatinine 0.61 - 1.24 mg/dL 1.20 1.24 1.27(H)  Sodium 135 - 145 mmol/L 130(L) 132(L) 135  Potassium 3.5 - 5.1 mmol/L 3.3(L) 3.7 3.9  Chloride 98 - 111 mmol/L 93(L) 90(L) 92(L)  CO2 22 - 32 mmol/L 27 31 33(H)  Calcium 8.9 - 10.3 mg/dL 8.9 9.2 8.9  Total Protein 6.5 - 8.1 g/dL 8.2(H) 8.6(H) 7.4  Total Bilirubin 0.3 - 1.2 mg/dL 1.1 1.4(H) 1.1  Alkaline Phos 38 - 126 U/L 156(H) 131(H) 103  AST 15 - 41 U/L 34 25 20  ALT 0 - 44 U/L 45(H) 30 21    Lab Results  Component Value Date   WBC 13.5 (H) 10/06/2021   HGB 14.5 10/06/2021   HCT 45.1 10/06/2021    MCV 86.9 10/06/2021   PLT 411 (H) 10/06/2021   NEUTROABS 10.7 (H) 10/06/2021    Lab Results  Component Value Date   CEA1 1.9 11/29/2020   ZHG992 4 11/30/2020    X-ray chest PA and lateral  Result Date: 09/14/2021 CLINICAL DATA:  56 year old male with a history of colon cancer, recurrent SBO s/p venting gastrostomy tube, who presents to the emergency department today for evaluation of abdominal pain. Of note, patient recently admitted to the hospital for recurrent SBO and had a gastrostomy tube placed. He was actually discharged from the hospital yesterday. Since then he states he has been draining his PEG tube and he continues to have a significant amount of output so he has not drained in several hours. He is now complaining of severe abdominal pain, distention, nausea vomiting. EXAM: CHEST - 2 VIEW COMPARISON:  01/04/2021. FINDINGS: Increased opacity at the left lung base compared to the prior exam, consistent with atelectasis, pneumonia or a combination. Remainder of the lungs is clear. Small left pleural effusion.  No pneumothorax. Cardiac silhouette normal in size. No mediastinal or hilar masses or evidence of adenopathy. Stable right internal jugular Port-A-Cath. Skeletal structures are grossly intact. IMPRESSION: 1. Left lower lung opacity consistent with a combination of atelectasis/pneumonia and a small effusion, new compared to the prior chest radiograph. Electronically Signed   By: Lajean Manes M.D.   On: 09/14/2021 10:59   Abd 1 View (KUB)  Result Date: 09/14/2021 CLINICAL DATA:  History of colon cancer with recurrent small bowel obstruction. Patient presents with abdominal pain. EXAM: ABDOMEN - 1 VIEW COMPARISON:  09/08/21 FINDINGS: Left upper quadrant gastrostomy tube identified. Since the previous exam there is been interval improvement in gaseous distension the of the small and large bowel loops. No new findings. IMPRESSION: Interval improvement in gaseous distension of the small  and large bowel loops compared with 09/08/2021. Electronically Signed   By: Kerby Moors M.D.   On: 09/14/2021 10:57   DG Abd 1 View  Result Date: 09/08/2021 CLINICAL DATA:  Abdominal pain, nausea, vomiting EXAM: ABDOMEN - 1 VIEW COMPARISON:  09/07/2021 FINDINGS: Prominent small bowel loops centrally again noted, similar prior study compatible with persistent small bowel obstruction. No free air or organomegaly. Visualized lung bases clear. IMPRESSION: Continued small bowel obstruction pattern, not significantly changed. Electronically Signed   By: Rolm Baptise M.D.   On: 09/08/2021 15:12   DG Abd 1 View  Result Date: 09/07/2021 CLINICAL DATA:  Nausea and vomiting; EXAM: ABDOMEN - 1 VIEW COMPARISON:  September 04, 2021 ;September 03, 2021; September 02, 2021 FINDINGS: There are a few mildly dilated loops of bowel in the upper abdomen, improved in comparison to prior from September 03, 2021. Interval removal of enteric tube. Partial visualization of CVC tip terminating over the region of the superior cavoatrial junction. No bowel gas visualized in the rectum. IMPRESSION: Persistent mild dilation of several loops of small bowel in the upper abdomen, overall improved since September 03, 2021. This could reflect persistent small-bowel obstruction. Electronically Signed   By: Valentino Saxon M.D.   On: 09/07/2021 14:00   IR GASTROSTOMY TUBE MOD SED  Result Date: 09/11/2021 CLINICAL DATA:  History of metastatic colon carcinoma with persistent small-bowel obstruction and request to place a venting gastrostomy tube for symptomatic relief. EXAM: PERCUTANEOUS GASTROSTOMY TUBE PLACEMENT ANESTHESIA/SEDATION: Moderate (conscious) sedation was employed during this procedure. A total of Versed 4.0 mg and Fentanyl 100 mcg was administered intravenously by radiology nursing. Moderate Sedation Time: 15 minutes. The patient's level of consciousness and vital signs were monitored continuously by radiology nursing  throughout the procedure under my direct supervision. CONTRAST:  75m OMNIPAQUE IOHEXOL 300 MG/ML  SOLN MEDICATIONS: 2 g IV Ancef. IV antibiotic was administered in an appropriate time interval prior to needle puncture of the skin. During the procedure the patient received 0.5 mg IV glucagon. FLUOROSCOPY TIME:  2 minutes and 24 seconds.  51.0 mGy. PROCEDURE: The procedure, risks, benefits, and alternatives were explained to the patient. Questions regarding the procedure were encouraged and answered. The patient understands and consents to the procedure. A time-out was performed prior to initiating the procedure. A 5-French catheter was advanced through the patient's mouth under fluoroscopy into the esophagus and to the level of the stomach. This catheter was used to insufflate the stomach with air under fluoroscopy. The abdominal wall was prepped with chlorhexidine in a sterile fashion, and a sterile drape was applied covering the operative field. A sterile gown and sterile gloves were used for the procedure. Local anesthesia was provided with 1% Lidocaine. A skin incision was made in the upper abdominal wall. Under fluoroscopy, an 18 gauge trocar needle was advanced into the stomach. Contrast injection was performed to confirm intraluminal position of the needle tip. A single T tack was then deployed in the lumen of the stomach. This was brought up to tension at the skin surface. Over a guidewire, a 9-French sheath was advanced into the lumen of the stomach. The wire was left in place as a safety wire. A loop snare device from a percutaneous gastrostomy kit was then advanced into the stomach. A floppy guide wire was advanced through the orogastric catheter under fluoroscopy in the stomach. The loop snare advanced through the percutaneous gastric access was used to snare the guide wire. This allowed withdrawal of the loop snare out of the patient's mouth by retraction of the orogastric catheter and wire. A 20-French  bumper retention gastrostomy tube was looped around the snare device. It was then pulled back through the patient's mouth. The retention bumper was brought up to the anterior gastric wall. The T tack suture was cut at the skin. The exiting gastrostomy tube was cut to appropriate length and a feeding adapter applied. The catheter was injected with contrast material to confirm position and a fluoroscopic spot image saved. The tube was then flushed with saline. A dressing was applied over the gastrostomy exit site.  COMPLICATIONS: None. FINDINGS: The stomach distended well with air allowing safe placement of the gastrostomy tube. After placement, the tip of the gastrostomy tube lies in the body of the stomach. IMPRESSION: Percutaneous gastrostomy with placement of a 20-French bumper retention tube in the body of the stomach. Electronically Signed   By: Aletta Edouard M.D.   On: 09/11/2021 16:11   DG CHEST PORT 1 VIEW  Result Date: 09/18/2021 CLINICAL DATA:  Left PICC placement EXAM: PORTABLE CHEST 1 VIEW COMPARISON:  09/14/2021 FINDINGS: Single frontal view of the chest demonstrates stable right chest wall port. Left-sided PICC tip overlies the superior vena cava. Cardiac silhouette is stable. Stable left basilar consolidation. Small effusion is suspected. Right chest is clear. No pneumothorax. IMPRESSION: 1. Left-sided PICC tip overlying superior vena cava. 2. Continued left basilar consolidation and likely small effusion. Electronically Signed   By: Randa Ngo M.D.   On: 09/18/2021 17:32   DG ABD ACUTE 2+V W 1V CHEST  Result Date: 09/30/2021 CLINICAL DATA:  Abdominal pain. EXAM: DG ABDOMEN ACUTE WITH 1 VIEW CHEST COMPARISON:  Abdominal radiographs 09/14/2021 CT abdomen and pelvis 09/02/2021 FINDINGS: Large body habitus. Right chest wall porta catheter with tip overlying the superior vena cava/right atrial junction. Cardiac silhouette and mediastinal contours are within normal limits. Mild calcification  within aortic arch. The lungs are clear. No pleural effusion or pneumothorax. A gastrostomy tube again overlies the left upper quadrant. There are air-fluid levels within multiple loops of bowel on upright view. There may be mild distention of small-bowel loops measuring up to approximately 4 cm in caliber No portal venous gas or pneumatosis is seen. No subdiaphragmatic free air on upright view. IMPRESSION: There are air-fluid levels throughout the small bowel. There is likely dilatation of some small bowel loops at least 4 cm. Note is made of small bowel obstruction with similar small bowel dilatation due to adhesions to the anterior abdominal wall seen on prior CT. Gastrostomy tube is again noted. Electronically Signed   By: Yvonne Kendall M.D.   On: 09/30/2021 09:29   ECHOCARDIOGRAM COMPLETE  Result Date: 10/01/2021    ECHOCARDIOGRAM REPORT   Patient Name:   EUGENIO DOLLINS Lehrmann Date of Exam: 10/01/2021 Medical Rec #:  [id number]       Height:       71.0 in Accession #:    [id number]      Weight:       257.9 lb Date of Birth:  September 14, 1966        BSA:          2.349 m Patient Age:    1 years        BP:           132/79 mmHg Patient Gender: M               HR:           112 bpm. Exam Location:  Inpatient Procedure: 2D Echo, Cardiac Doppler, Color Doppler and Intracardiac            Opacification Agent Indications:    Abnormal ECG R94.31  History:        Patient has no prior history of Echocardiogram examinations.                 Risk Factors:Dyslipidemia. GERD.  Sonographer:    Darlina Sicilian RDCS Referring Phys: [id number] DAVID MANUEL [place]  1. Left ventricular ejection fraction, by estimation, is 65 to 70%. The left ventricle has  normal function. The left ventricle has no regional wall motion abnormalities. Left ventricular diastolic parameters are consistent with Grade I diastolic dysfunction (impaired relaxation).  2. Right ventricular systolic function is normal. The right ventricular size is normal.  3.  The mitral valve is normal in structure. No evidence of mitral valve regurgitation. No evidence of mitral stenosis.  4. The aortic valve is normal in structure. Aortic valve regurgitation is not visualized. No aortic stenosis is present.  5. The inferior vena cava is normal in size with greater than 50% respiratory variability, suggesting right atrial pressure of 3 mmHg. FINDINGS  Left Ventricle: Left ventricular ejection fraction, by estimation, is 65 to 70%. The left ventricle has normal function. The left ventricle has no regional wall motion abnormalities. Definity contrast agent was given IV to delineate the left ventricular  endocardial borders. The left ventricular internal cavity size was normal in size. There is no left ventricular hypertrophy. Left ventricular diastolic parameters are consistent with Grade I diastolic dysfunction (impaired relaxation). Normal left ventricular filling pressure. Right Ventricle: The right ventricular size is normal. No increase in right ventricular wall thickness. Right ventricular systolic function is normal. Left Atrium: Left atrial size was normal in size. Right Atrium: Right atrial size was normal in size. Pericardium: There is no evidence of pericardial effusion. Mitral Valve: The mitral valve is normal in structure. No evidence of mitral valve regurgitation. No evidence of mitral valve stenosis. Tricuspid Valve: The tricuspid valve is normal in structure. Tricuspid valve regurgitation is not demonstrated. No evidence of tricuspid stenosis. Aortic Valve: The aortic valve is normal in structure. Aortic valve regurgitation is not visualized. No aortic stenosis is present. Pulmonic Valve: The pulmonic valve was normal in structure. Pulmonic valve regurgitation is not visualized. No evidence of pulmonic stenosis. Aorta: The aortic root is normal in size and structure. Venous: The inferior vena cava is normal in size with greater than 50% respiratory variability, suggesting  right atrial pressure of 3 mmHg. IAS/Shunts: No atrial level shunt detected by color flow Doppler.  LEFT VENTRICLE PLAX 2D LVIDd:         5.00 cm   Diastology LVIDs:         2.80 cm   LV e' medial:    6.53 cm/s LV PW:         0.90 cm   LV E/e' medial:  7.0 LV IVS:        1.00 cm   LV e' lateral:   6.74 cm/s LVOT diam:     2.80 cm   LV E/e' lateral: 6.8 LV SV:         45 LV SV Index:   19 LVOT Area:     6.16 cm  RIGHT VENTRICLE RV S prime:     9.14 cm/s TAPSE (M-mode): 1.5 cm LEFT ATRIUM         Index LA diam:    2.50 cm 1.06 cm/m  AORTIC VALVE LVOT Vmax:   49.00 cm/s LVOT Vmean:  30.900 cm/s LVOT VTI:    0.073 m  AORTA Ao Root diam: 3.80 cm Ao Asc diam:  3.50 cm MITRAL VALVE MV Area (PHT): 3.58 cm    SHUNTS MV Decel Time: 212 msec    Systemic VTI:  0.07 m MV E velocity: 45.80 cm/s  Systemic Diam: 2.80 cm MV A velocity: 60.00 cm/s MV E/A ratio:  0.76 Skeet Latch MD Electronically signed by Skeet Latch MD Signature Date/Time: 10/01/2021/12:05:12 PM    Final  IR PICC REPLACEMENT LEFT INC IMG GUIDE  Result Date: 10/06/2021 INDICATION: 56 year old male with nonfunctional PICC EXAM: IMAGE GUIDED EXCHANGE OF PERIPHERAL INSERTED CENTRAL CATHETER MEDICATIONS: None ANESTHESIA/SEDATION: None FLUOROSCOPY TIME:  Fluoroscopy Time: 0 minutes 48 seconds (10 mGy). COMPLICATIONS: None PROCEDURE: The patient was advised of the possible risks and complications and agreed to undergo the procedure. The patient was then brought to the angiographic suite for the procedure. Image was acquired. This demonstrates that the previously placed PICC head been withdrawn to the SVC, directed into the lateral wall. The left arm and indwelling central catheter was prepped with chlorhexidine, draped in the usual sterile fashion using maximum barrier technique (cap and mask, sterile gown, sterile gloves, large sterile sheet, hand hygiene and cutaneous antisepsis). The indwelling catheter was cut at the hub, and the fragment was  withdrawn, and then cut again at a shorter length. A Nitrex 80 cm 0.018 wire was introduced through the catheter into the vein. Catheter fragment was removed on the wire. Over the wire, a 5 [place] dual lumen power injectable PICC measuring 46 cm was advanced to the upper right atrium, 2 vertebral body below the carina. Fluoroscopy during the procedure and fluoro spot radiograph confirms appropriate catheter position. The catheter was flushed and covered with asterile dressing. Patient tolerated the procedure well and remained hemodynamically stable throughout. No complications were encountered and no significant blood loss. IMPRESSION: Image guided exchange of left upper extremity nonfunctional PICC for a new 46 cm dual lumen PICC. Signed, Dulcy Fanny. Dellia Nims, RPVI [facility] Electronically Signed   By: Corrie Mckusick D.O.   On: 10/06/2021 09:39   [place] EKG SITE RITE  Result Date: 09/30/2021 If Site Rite image not attached, placement could not be confirmed due to current cardiac rhythm.  [place] EKG Site Rite  Result Date: 09/18/2021 If Site Rite image not attached, placement could not be confirmed due to current cardiac rhythm.  [place] EKG SITE RITE  Result Date: 09/18/2021 If Site Rite image not attached, placement could not be confirmed due to current cardiac rhythm.    No future appointments.     LOS: 5 days   Addendum  I have seen the patient, examined him. I agree with the assessment and and plan and have edited the notes.   Mr. Belton continue to have pain spikes, fentanyl path was increased again today. He is sleepy but able to answer questions. Wife at bed side. He will likely needs iv/sq dilaudid if he is able to be discharged. I do not think he is a candidate for more chemo given his very difficult controlled pain and low PS. I started talking to him and his wife about palliative care alone, he still dose not want to give it up cancer  treatment, will not push him, but I do not plan to give more chemo at least during this hospital stay. I am thinking he may need residential hospice for pain control, but pt and his wife would like to go home if his pain is able to be controlled.   [name]  10/06/2021

## 2021-10-06 NOTE — Progress Notes (Signed)
PHARMACY - TOTAL PARENTERAL NUTRITION CONSULT NOTE   Indication: Small bowel obstruction  Patient Measurements: Height: '5\' 11"'  (180.3 cm) Weight: 101 kg (222 lb 9.6 oz) IBW/kg (Calculated) : 75.3 TPN AdjBW (KG): 85.7 Body mass index is 31.05 kg/m. Usual Weight: Weight was stable 06/19/21-09/14/21 and is now -32 lb since 09/14/21  Assessment:  37 yoM with PMH metastatic colon cancer with peritoneal carcinomatosis with recent admissions for SBO readmitted 1/17 for same. Venting PEG recently placed, but continues to have abdominal pain despite draining collection bag 3-4x daily. PICC line palced 1/19 d/t difficulty accessing implanted chemo port. Pharmacy consulted to manage TPN inpatient.  Glucose / Insulin: No hx DM. Hgb A1c 5.5% on 09/14/21 - CBG range 115-142, 2 units SSI required - On dexamethasone 65m IV q24h Electrolytes: Na (130), K (3.3) both low. All other lytes WNL including CorrCa (9.7) -Chloride remains low despite max chloride in TPN Renal:  SCr remains elevated compared to recent admission, but trending down to WNL. BUN remains elevated and trending up. UOP not charted Hepatic: Tbili decreased to normal range; Albumin low; ALT & Alk Phos now slightly elevated Trig: WNL on 1/20, 1/23 Intake / Output; MIVF: Strict I/O not measured. - No UOP charted. Asked RN to chart today. - 480 mL drain output, 1 unmeasured emesis - MIVF: LR changed to D5 at 728mhr on 1/21 by Dr WoSherral HammersI Imaging: - 1/17: persistent SBO GI Surgeries / Procedures:  - Venting PEG placed 12/29  Central access: double lumen PICC in L brachial vein placed 1/19 in IR TPN start date: 1/19  Home TPN formula received from [employer] ml over 24hr with one hour taper up and down provides protein 125g/day, 2225 kcal/day.  Nutritional Goals: TPN cycled over 18 hours provides 130 g protein, 2365 kcal  RD Assessment: 1/20 Estimated Needs Total Energy Estimated Needs: 2365-2600 kcal Total Protein Estimated Needs:  120-135 grams Total Fluid Estimated Needs: >/= 2.5 L/day  Current Nutrition:  Full liquids TPN - cycled over 18 hours  Plan:  Now: KCl 10 mEq IV x 4 runs  At 1800: Continue TPN cycled over 18 hours Taper up at 67 mL/hr x1 hour, infuse at 134 mL/hr x16 hours, then taper down at 67 mL/hr x1 hour Electrolytes in TPN: Increase Na, K. Add back Phos Na 150 mEq/L K 25 mEq/L Ca 50m46mL Mg 0mE56m Phos 5 mmol/L Cl:Ac - MAX Cl Add standard MVI and trace elements to TPN Sensitive SSI - custom CBG check times of 2hr after starting TPN, 1 hr after stopping TPN, once in the middle of infusion and once while off off TPN MIVF D5 at 75ml950mwhile TPN off per MD Monitor TPN labs on Mon/Thurs. Recheck electrolytes with AM labs tomorrow.  Ricardo Rosman Lenis NoonrmD 10/06/21 7:47 AM

## 2021-10-06 NOTE — Plan of Care (Signed)
  Problem: Education: Goal: Knowledge of General Education information will improve Description: Including pain rating scale, medication(s)/side effects and non-pharmacologic comfort measures Outcome: Progressing   Problem: Activity: Goal: Risk for activity intolerance will decrease Outcome: Progressing   Problem: Pain Managment: Goal: General experience of comfort will improve Outcome: Progressing   

## 2021-10-06 NOTE — Procedures (Signed)
Interventional Radiology Procedure Note  Procedure: Exchange of LUE non-functional DL PICC.   Given nonfunction and the appearance on the initial xray, we extended length by 2cm, for total of 46cm to the upper right atrium. Functional on completion  Complications: None Recommendations:  - Ok to use  Signed,  Dulcy Fanny. Earleen Newport, DO

## 2021-10-06 NOTE — Progress Notes (Signed)
PROGRESS NOTE    Ricardo [name]  PIR:[id number] DOB: 1966/02/04 DOA: 09/30/2021 PCP: [name], MD   Brief Narrative:  56 y.o. 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   ED Course: Initial vital signs were temperature 99.1 F, pulse 133, respiration 20, BP 138/91 mmHg O2 sat 100% on room air.  The patient received 2000 mL of LR bolus hydromorphone 2 mg IVP x1 and Compazine 10 mg IVP x1.   Lab work: CBC is her white count 2.2, hemoglobin 13.5 g/dL platelets 252.  CMP with potassium of 3.2 and chloride 97 mmol/L.  LFTs were normal SF4 and albumin of 3.0 g/dL.  Glucose 113, BUN 34 and creatinine 1.12 mg/dL.   Review of Systems: As per HPI otherwise all other systems reviewed and are negative.   Subjective: 1/23 A/O x4, states continued abdominal pain but is beginning to come under control with changes in pain medication.  Still not consuming much per p.o.    Assessment & Plan:  Covid vaccination;  Principal Problem:   Intractable abdominal pain Active Problems:   Nausea and vomiting   GERD (gastroesophageal reflux disease)   Colon cancer metastasized to multiple sites [facility])   Class 2 obesity   Leukopenia   Hypokalemia  Colon cancer metastasized to multiple sites ([place]) -Poor overall prognosis. -1/18 spoke with Dr. Burr Medico.  And she is aware that patient has been rehospitalized.  States will come I see patient today.  Will await any additional recommendations -1/19 hematology oncology  recommendation: -The patient is not yet ready for hospice.  We are willing to give him 1 more cycle of chemotherapy.  PICC line has been placed.  Timing of chemo per Dr. Burr Medico.  -He has not yet started his home TPN.  PICC line has been placed.  TPN order set  has been placed. -Goals of treatment are for palliation of symptoms.  He understands that his  disease is not curable.  He agrees to limited code to include CPR and ACLS  medications, but no defibrillation or cardioversion or mechanical ventilation   Intractable abdominal pain -Patient pain controlled on PCA pump + fentanyl patch. - Had a long conversation with patient concerning his goals for discharge.  Patient does not want care team to give up on him though he understands that serious condition. - 1/19 have decreased Dilaudid per PCA pump -We will await any additional recommendations from Dr. Burr Medico oncology -1/20 we will continue to work with palliative care on optimizing patient's pain regimen.  - 1/23 increase Fentanyl patch to 225 mcg/hr.  .  Nausea and vomiting - Per patient mostly resolved.  Requested to start consuming p.o. - Currently continue n.p.o. - 1/22 full liquid diet  Moderate protein malnutrition ([place]) -1/18 awaiting placement of PICC line by IR, IV team unsuccessful - Restart TPN per pharmacy as soon as PICC line placed. -1/19 TPN should be restarted on 1/20 as PICC line was replaced late today. -1/20 TPN started  Hypokalemia -Potassium goal> 4 -1/23 potassium IV 40 mEq  Acute Diastolic CHF - 6/30  echocardiogram consistent with acute diastolic failure. - Strict in and out -1.0 L - Daily weight Filed Weights   09/30/21 2035 10/03/21 0500 10/04/21 0421  Weight: 117 kg 102.7 kg 101 kg     Sinus tachycardia -Continue IV fluids.   -Metoprolol 12.5 mg BID -1/19 currently sinus tachycardia, if continues overnight will increase Metoprolol    GERD (gastroesophageal reflux disease) -Pantoprazole has been  increased to 40 mg IVP BID.   GAD (generalized anxiety disorder) -Continue alprazolam as needed. -Parenteral lorazepam as needed if unable to PO. -1/19 Cymbalta 30 mg daily (should also help with pain)  Class 2 obesity    Normocytic anemia -1/19 anemia panel; consistent with anemia of chronic disease - Monitor H/H  Lab Results  Component Value Date   HGB 14.5 10/06/2021   HGB 14.3 10/05/2021   HGB 13.0 10/04/2021   HGB 13.2 10/03/2021   HGB 12.5 (L) 10/01/2021   AKI - 1/21 new onset AKI secondary to TPN - 1/21 D5W 40ml/hr Lab Results  Component Value Date   CREATININE 1.20 10/06/2021   CREATININE 1.24 10/05/2021   CREATININE 1.27 (H) 10/04/2021   CREATININE 1.32 (H) 10/03/2021   CREATININE 1.34 (H) 10/01/2021  -1/23 improved with addition of free water   DVT prophylaxis: Lovenox per pharmacy Code Status: Partial Family Communication: 1/23 wife and pastor at bedside for discussion of plan of care all questions answered Status is: Inpatient    Dispo: The patient is from: Home              Anticipated d/c is to: Home              Anticipated d/c date is: 2 days              Patient currently is not medically stable to d/c.      Consultants:  Palliative care IR Oncology Dr. Burr Medico    Procedures/Significant Events:  1/18 Echocardiogram Left Ventricle: Left ventricular ejection fraction, by estimation, is 65  to 70%. The left ventricle has normal function. The left ventricle has no  regional wall motion abnormalities. Definity contrast agent was given IV  to delineate the left ventricular   endocardial borders. The left ventricular internal cavity size was normal  in size. There is no left ventricular hypertrophy. Left ventricular  diastolic parameters are consistent with Grade I diastolic dysfunction  (impaired relaxation). Normal left  ventricular filling pressure.     I have personally reviewed and interpreted all radiology studies and my findings are  as above.  VENTILATOR SETTINGS: Nasal cannula 1/23 Flow 2 L/min SPO2 93%  Cultures   Antimicrobials:    Devices    LINES / TUBES:  Dual lumen PowerPICC placed in left brachial vein 1/19>>>>    Continuous Infusions:  dextrose     potassium chloride     TPN CYCLIC-ADULT (ION) 326 mL/hr at 71/24/58 0998   TPN CYCLIC-ADULT (ION)       Objective: Vitals:   10/05/21 2208 10/05/21 2331 10/06/21 0344 10/06/21 0930  BP: (!) 134/99     Pulse: (!) 110     Resp: 16     Temp: 98.4 F (36.9 C)     TempSrc: Oral     SpO2: 95% 92% 90% 93%  Weight:      Height:        Intake/Output Summary (Last 24 hours) at 10/06/2021 0944 Last data filed at 10/06/2021 0500 Gross per 24 hour  Intake 1809.43 ml  Output  480 ml  Net 1329.43 ml    Filed Weights   09/30/21 2035 10/03/21 0500 10/04/21 0421  Weight: 117 kg 102.7 kg 101 kg    Examination:  General: A/O x4, No acute respiratory distress Eyes: negative scleral hemorrhage, negative anisocoria, negative icterus ENT: Negative Runny nose, negative gingival bleeding, Neck:  Negative scars, masses, torticollis, lymphadenopathy, JVD Lungs: Clear to auscultation bilaterally without wheezes or crackles Cardiovascular: Regular rate and rhythm without murmur gallop or rub normal S1 and S2 Abdomen: negative abdominal pain, nondistended, negative soft, bowel sounds, no rebound, no ascites, no appreciable mass Extremities: LEFT arm PICC in place, covered and clean No significant cyanosis, clubbing, or edema bilateral lower extremities Skin: Negative rashes, lesions, ulcers Psychiatric:  Negative depression, negative anxiety, negative fatigue, negative mania  Central nervous system:  Cranial nerves II through XII intact, tongue/uvula midline, all extremities muscle strength 5/5, sensation intact throughout, negative dysarthria, negative expressive aphasia, negative receptive aphasia.  .     Data Reviewed: Care during the described  time interval was provided by me .  I have reviewed this patient's available data, including medical history, events of note, physical examination, and all test results as part of my evaluation.   CBC: Recent Labs  Lab 10/01/21 0940 10/03/21 0340 10/04/21 0352 10/05/21 0547 10/06/21 0513  WBC 2.8* 7.0 8.8 11.9* 13.5*  NEUTROABS 1.2* 4.1 5.6 8.4* 10.7*  HGB 12.5* 13.2 13.0 14.3 14.5  HCT 39.7 40.7 40.6 44.5 45.1  MCV 90.0 86.6 86.9 87.4 86.9  PLT 271 350 380 404* 411*    Basic Metabolic Panel: Recent Labs  Lab 10/01/21 0940 10/03/21 0340 10/04/21 0352 10/05/21 0547 10/06/21 0513  NA 139 134* 135 132* 130*  K 4.6 3.8 3.9 3.7 3.3*  CL 97* 93* 92* 90* 93*  CO2 33* 33* 33* 31 27  GLUCOSE 117* 146* 174* 127* 115*  BUN 29* 28* 31* 37* 39*  CREATININE 1.34* 1.32* 1.27* 1.24 1.20  CALCIUM 8.6* 8.5* 8.9 9.2 8.9  MG 2.6* 2.3 2.3 2.2 2.1  PHOS 4.6 4.0 4.5 4.1 2.8    GFR: Estimated Creatinine Clearance: 83.2 mL/min (by C-G formula based on SCr of 1.2 mg/dL). Liver Function Tests: Recent Labs  Lab 10/01/21 0940 10/03/21 0340 10/04/21 0352 10/05/21 0547 10/06/21 0513  AST 24 23 20 25  34  ALT 24 21 21 30  45*  ALKPHOS 111 98 103 131* 156*  BILITOT 1.4* 1.3* 1.1 1.4* 1.1  PROT 7.3 7.5 7.4 8.6* 8.2*  ALBUMIN 2.8* 2.6* 2.6* 3.1* 3.0*    Recent Labs  Lab 09/30/21 0830  LIPASE 33    No results for input(s): AMMONIA in the last 168 hours. Coagulation Profile: No results for input(s): INR, PROTIME in the last 168 hours. Cardiac Enzymes: No results for input(s): CKTOTAL, CKMB, CKMBINDEX, TROPONINI in the last 168 hours. BNP (last 3 results) No results for input(s): PROBNP in the last 8760 hours. HbA1C: No results for input(s): HGBA1C in the last 72 hours. CBG: Recent Labs  Lab 10/05/21 0546 10/05/21 0757 10/05/21 1701 10/05/21 2207 10/06/21 0553  GLUCAP 120* 149* 142* 141* 141*    Lipid Profile: Recent Labs    10/06/21 0513  TRIG 140    Thyroid Function  Tests: No results for input(s): TSH, T4TOTAL, FREET4, T3FREE, THYROIDAB in the last 72 hours. Anemia Panel: No results for input(s): VITAMINB12, FOLATE, FERRITIN, TIBC, IRON, RETICCTPCT in the last 72 hours.  Urine analysis:    Component Value Date/Time   COLORURINE YELLOW  09/03/2021 0800   APPEARANCEUR CLEAR 09/03/2021 0800   LABSPEC >1.046 (H) 09/03/2021 0800   PHURINE 5.0 09/03/2021 0800   GLUCOSEU NEGATIVE 09/03/2021 0800   HGBUR NEGATIVE 09/03/2021 0800   BILIRUBINUR NEGATIVE 09/03/2021 0800   KETONESUR NEGATIVE 09/03/2021 0800   PROTEINUR NEGATIVE 09/03/2021 0800   NITRITE NEGATIVE 09/03/2021 0800   LEUKOCYTESUR NEGATIVE 09/03/2021 0800   Sepsis Labs: @LABRCNTIP (procalcitonin:4,lacticidven:4)  )No results found for this or any previous visit (from the past 240 hour(s)).       Radiology Studies: IR PICC REPLACEMENT LEFT INC IMG GUIDE  Result Date: 10/06/2021 INDICATION: 56 year old male with nonfunctional PICC EXAM: IMAGE GUIDED EXCHANGE OF PERIPHERAL INSERTED CENTRAL CATHETER MEDICATIONS: None ANESTHESIA/SEDATION: None FLUOROSCOPY TIME:  Fluoroscopy Time: 0 minutes 48 seconds (10 mGy). COMPLICATIONS: None PROCEDURE: The patient was advised of the possible risks and complications and agreed to undergo the procedure. The patient was then brought to the angiographic suite for the procedure. Image was acquired. This demonstrates that the previously placed PICC head been withdrawn to the SVC, directed into the lateral wall. The left arm and indwelling central catheter was prepped with chlorhexidine, draped in the usual sterile fashion using maximum barrier technique (cap and mask, sterile gown, sterile gloves, large sterile sheet, hand hygiene and cutaneous antisepsis). The indwelling catheter was cut at the hub, and the fragment was withdrawn, and then cut again at a shorter length. A Nitrex 80 cm 0.018 wire was introduced through the catheter into the vein. Catheter fragment was  removed on the wire. Over the wire, a 5 [place] dual lumen power injectable PICC measuring 46 cm was advanced to the upper right atrium, 2 vertebral body below the carina. Fluoroscopy during the procedure and fluoro spot radiograph confirms appropriate catheter position. The catheter was flushed and covered with asterile dressing. Patient tolerated the procedure well and remained hemodynamically stable throughout. No complications were encountered and no significant blood loss. IMPRESSION: Image guided exchange of left upper extremity nonfunctional PICC for a new 46 cm dual lumen PICC. Signed, Dulcy Fanny. Dellia Nims, RPVI [facility] Electronically Signed   By: Corrie Mckusick D.O.   On: 10/06/2021 09:39        Scheduled Meds:  Chlorhexidine Gluconate Cloth  6 each Topical Daily   dexamethasone (DECADRON) injection  4 mg Intravenous Q24H   DULoxetine  30 mg Oral Daily   enoxaparin (LOVENOX) injection  50 mg Subcutaneous Q24H   fentaNYL  1 patch Transdermal Q72H   And   fentaNYL  1 patch Transdermal Q72H   HYDROmorphone   Intravenous Q4H   insulin aspart  0-9 Units Subcutaneous 4 times per day   lidocaine       metoprolol tartrate  12.5 mg Oral BID   pantoprazole (PROTONIX) IV  40 mg Intravenous Q12H   sodium chloride flush  10-40 mL Intracatheter Q12H   Continuous Infusions:  dextrose     potassium chloride     TPN CYCLIC-ADULT (ION) 564 mL/hr at 33/29/51 8841   TPN CYCLIC-ADULT (ION)       LOS: 5 days   The patient is critically ill with multiple organ systems failure and requires high complexity decision making for assessment and support, frequent evaluation and titration of therapies, application of advanced monitoring technologies and extensive interpretation of multiple databases. Critical Care Time devoted to patient care services described in this note  Time spent: 40 minutes     Quashawn Jewkes, Geraldo Docker, MD [facility]  If 7PM-7AM, please contact night-coverage 10/06/2021, 9:44 AM

## 2021-10-07 DIAGNOSIS — R112 Nausea with vomiting, unspecified: Secondary | ICD-10-CM | POA: Diagnosis not present

## 2021-10-07 DIAGNOSIS — C189 Malignant neoplasm of colon, unspecified: Secondary | ICD-10-CM | POA: Diagnosis not present

## 2021-10-07 DIAGNOSIS — R109 Unspecified abdominal pain: Secondary | ICD-10-CM | POA: Diagnosis not present

## 2021-10-07 DIAGNOSIS — G893 Neoplasm related pain (acute) (chronic): Secondary | ICD-10-CM | POA: Diagnosis not present

## 2021-10-07 LAB — CBC WITH DIFFERENTIAL/PLATELET
Abs Immature Granulocytes: 1.33 10*3/uL — ABNORMAL HIGH (ref 0.00–0.07)
Basophils Absolute: 0 10*3/uL (ref 0.0–0.1)
Basophils Relative: 0 %
Eosinophils Absolute: 0 10*3/uL (ref 0.0–0.5)
Eosinophils Relative: 0 %
HCT: 44.2 % (ref 39.0–52.0)
Hemoglobin: 14.2 g/dL (ref 13.0–17.0)
Immature Granulocytes: 11 %
Lymphocytes Relative: 6 %
Lymphs Abs: 0.7 10*3/uL (ref 0.7–4.0)
MCH: 28 pg (ref 26.0–34.0)
MCHC: 32.1 g/dL (ref 30.0–36.0)
MCV: 87 fL (ref 80.0–100.0)
Monocytes Absolute: 1 10*3/uL (ref 0.1–1.0)
Monocytes Relative: 9 %
Neutro Abs: 9.1 10*3/uL — ABNORMAL HIGH (ref 1.7–7.7)
Neutrophils Relative %: 74 %
Platelets: 317 10*3/uL (ref 150–400)
RBC: 5.08 MIL/uL (ref 4.22–5.81)
RDW: 14.9 % (ref 11.5–15.5)
WBC: 12.2 10*3/uL — ABNORMAL HIGH (ref 4.0–10.5)
nRBC: 0 % (ref 0.0–0.2)

## 2021-10-07 LAB — COMPREHENSIVE METABOLIC PANEL
ALT: 40 U/L (ref 0–44)
AST: 28 U/L (ref 15–41)
Albumin: 2.9 g/dL — ABNORMAL LOW (ref 3.5–5.0)
Alkaline Phosphatase: 152 U/L — ABNORMAL HIGH (ref 38–126)
Anion gap: 7 (ref 5–15)
BUN: 39 mg/dL — ABNORMAL HIGH (ref 6–20)
CO2: 25 mmol/L (ref 22–32)
Calcium: 8.6 mg/dL — ABNORMAL LOW (ref 8.9–10.3)
Chloride: 99 mmol/L (ref 98–111)
Creatinine, Ser: 1.11 mg/dL (ref 0.61–1.24)
GFR, Estimated: >60 mL/min (ref >60)
Glucose, Bld: 96 mg/dL (ref 70–99)
Potassium: 4.1 mmol/L (ref 3.5–5.1)
Sodium: 131 mmol/L — ABNORMAL LOW (ref 135–145)
Total Bilirubin: 1 mg/dL (ref 0.3–1.2)
Total Protein: 7.7 g/dL (ref 6.5–8.1)

## 2021-10-07 LAB — GLUCOSE, CAPILLARY
Glucose-Capillary: 112 mg/dL — ABNORMAL HIGH (ref 70–99)
Glucose-Capillary: 139 mg/dL — ABNORMAL HIGH (ref 70–99)
Glucose-Capillary: 146 mg/dL — ABNORMAL HIGH (ref 70–99)
Glucose-Capillary: 154 mg/dL — ABNORMAL HIGH (ref 70–99)
Glucose-Capillary: 188 mg/dL — ABNORMAL HIGH (ref 70–99)

## 2021-10-07 LAB — PHOSPHORUS: Phosphorus: 2.6 mg/dL (ref 2.5–4.6)

## 2021-10-07 LAB — MAGNESIUM: Magnesium: 2 mg/dL (ref 1.7–2.4)

## 2021-10-07 MED ORDER — SODIUM CHLORIDE 0.9 % IV SOLN
25.0000 ug/h | INTRAVENOUS | Status: DC
  Administered 2021-10-07: 13:00:00 20 ug/h via INTRAVENOUS
  Administered 2021-10-09 – 2021-10-14 (×6): 25 ug/h via INTRAVENOUS
  Filled 2021-10-07 (×10): qty 1

## 2021-10-07 MED ORDER — INSULIN ASPART 100 UNIT/ML IJ SOLN
0.0000 [IU] | INTRAMUSCULAR | Status: DC
  Administered 2021-10-07: 23:00:00 2 [IU] via SUBCUTANEOUS
  Administered 2021-10-08: 06:00:00 1 [IU] via SUBCUTANEOUS

## 2021-10-07 MED ORDER — TRAVASOL 10 % IV SOLN
INTRAVENOUS | Status: AC
  Filled 2021-10-07: qty 1299.6

## 2021-10-07 MED ORDER — DEXTROSE 5 % IV SOLN: INTRAVENOUS | Status: DC

## 2021-10-07 MED ORDER — DEXAMETHASONE SODIUM PHOSPHATE 4 MG/ML IJ SOLN
4.0000 mg | Freq: Two times a day (BID) | INTRAMUSCULAR | Status: DC
  Administered 2021-10-07 – 2021-10-14 (×15): 4 mg via INTRAVENOUS
  Filled 2021-10-07 (×15): qty 1

## 2021-10-07 NOTE — Progress Notes (Addendum)
PHARMACY - TOTAL PARENTERAL NUTRITION CONSULT NOTE   Indication: Small bowel obstruction  Patient Measurements: Height: '5\' 11"'  (180.3 cm) Weight: 101 kg (222 lb 9.6 oz) IBW/kg (Calculated) : 75.3 TPN AdjBW (KG): 85.7 Body mass index is 31.05 kg/m. Usual Weight: Weight was stable 06/19/21-09/14/21 and is now -32 lb since 09/14/21  Assessment:  57 yoM with PMH metastatic colon cancer with peritoneal carcinomatosis with recent admissions for SBO readmitted 1/17 for same. Venting PEG recently placed, but continues to have abdominal pain despite draining collection bag 3-4x daily. PICC line palced 1/19 d/t difficulty accessing implanted chemo port. Pharmacy consulted to manage TPN inpatient.  Glucose / Insulin: No hx DM. Hgb A1c 5.5% on 09/14/21 - CBG range 96 - 154, 4 units SSI required. Within goal on and off of TPN - On dexamethasone 61m IV q24h Electrolytes: Na (131) remains low despite max Na concentration in TPN; All other lytes WNL including CorrCa (9.5) -Chloride now WNL with max chloride in TPN Renal:  SCr has trended down to WNL. BUN remains elevated but stable. UOP charted. Hepatic: Tbili & ALT decreased to normal range; Albumin low; Alk Phos remains slightly elevated Trig: WNL on 1/20, 1/23 Intake / Output; MIVF: Strict I/O not measured. - UOP: 400 mL + 5 unmeasured.  - G-tube output: 2600 mL - MIVF: LR changed to D5 at 773mhr on 1/21 by Dr WoSherral Hammerso run while TPN is not infusing GI Imaging: - 1/17: persistent SBO GI Surgeries / Procedures:  - Venting PEG placed 12/29  Central access: double lumen PICC in L brachial vein placed 1/19 in IR -1/23: Exchange of LUE non-functional DL PICC by IR TPN start date: 1/19  Home TPN formula received from [employer] mL over 24hr with one hour taper up and down provides protein 125g/day, 2225 kcal/day.  Nutritional Goals: TPN cycled over 18 hours provides 130 g protein, 2365 kcal  RD Assessment: 1/20 Estimated Needs Total Energy  Estimated Needs: 2365-2600 kcal Total Protein Estimated Needs: 120-135 grams Total Fluid Estimated Needs: >/= 2.5 L/day  Current Nutrition:  Full liquids TPN - cycled over 18 hours  Plan:   At [id number] cyclic TPN, decrease infusion time to 16 hours Taper up at 76 mL/hr x1 hour, infuse at 152 mL/hr x14 hours, then taper down at 76 mL/hr x1 hour Electrolytes in TPN: No changes Na 150 mEq/L (max concentration) K 25 mEq/L Ca 76m59mL Mg 0mE876m Phos 5 mmol/L Cl:Ac - MAX Cl Add standard MVI and trace elements to TPN Sensitive SSI - custom CBG check times of 2hr after starting TPN, 1 hr after stopping TPN, once in the middle of infusion and once while off off TPN MIVF D5 at 75mL28mwhile TPN off per MD Monitor TPN labs on Mon/Thurs. Recheck electrolytes with AM labs tomorrow.  Shamecka Hocutt Lenis NoonrmD 10/07/21 7:37 AM  Addendum: Discussed MIVF with Dr. PatelPosey Prontoll discontinue orders for D5 to infuse while TPN is off.   Chonda Baney Lenis NoonrmD 10/07/21 11:54 AM

## 2021-10-07 NOTE — Plan of Care (Signed)
  Problem: Safety: Goal: Ability to remain free from injury will improve Outcome: Progressing   Problem: Pain Managment: Goal: General experience of comfort will improve Outcome: Progressing   

## 2021-10-07 NOTE — Progress Notes (Signed)
°  Progress Note   Patient: Ricardo [name] [id number]:[id number] DOB: 10/22/1965 DOA: 09/30/2021     6 DOS: the patient was seen and examined on 10/07/2021   Brief hospital course: Past medical history of osteoarthritis, class II obesity, anxiety, HLD, GERD, colon cancer with metastasis and peritoneal carcinomatosis.  Presents with complaints of worsening abdominal pain.  Assessment and Plan Metastatic colon cancer with intractable cancer-related pain. Patient has overall poor prognosis. Oncology following up with the patient. Palliative care also consulted. Patient has a venting G-tube for his small bowel obstruction. Not a candidate for further chemotherapy. Ideal candidate for residential hospice placement given poor symptom control as well as poor prognosis. Currently on TPN. Palliative care adding octreotide for pain control.  Cancer related pain. Primary issue for the patient. On fentanyl patch as well as PCA pump. Management per palliative care.  Small bowel obstruction. Patient has a venting G-tube. Monitor.  Hypokalemia. Currently being corrected. Monitor.  Obesity. Moderate protein calorie malnutrition. Currently receiving TPN via PICC line. Monitor.  Acute HFpEF. Volume appears to be stable. Monitor.  Sinus tachycardia.  In the setting of pain. Monitor.  Anxiety. Continuing current regimen.  GERD. Continuing PPI.  AKI. Baseline serum creatinine 0.78. On presentation serum creatinine 1.12 meeting AKI criteria. Of worsening further on 1/18. Currently improving. Monitor.    Subjective: Pain well controlled.  No nausea no vomiting.  Objective Vitals:   10/07/21 1025 10/07/21 1217 10/07/21 1315 10/07/21 1558  BP:   122/89   Pulse:   97   Resp: 16 14 16 14   Temp:      TempSrc:      SpO2: 96% 94% 97% 95%  Weight:      Height:        General: Appear in mild distress, no Rash; Oral Mucosa Clear, moist. no Abnormal Neck Mass Or lumps, Conjunctiva normal   Cardiovascular: S1 and S2 Present, no Murmur, Respiratory: good respiratory effort, Bilateral Air entry present and CTA, no Crackles, no wheezes Abdomen: Bowel Sound present, Soft and no tenderness Extremities: no Pedal edema Neurology: alert and oriented to time, place, and person affect appropriate. no new focal deficit Gait not checked due to patient safety concerns   Data Reviewed:  There are no new results to review at this time.  Family Communication: Wife and family at bedside.  Disposition: Status is: Inpatient  Remains inpatient appropriate because: Ongoing pain control requiring IV PCA pump.          Author: Berle Mull, MD 10/07/2021 7:28 PM  For on call review [email or web address].

## 2021-10-07 NOTE — Progress Notes (Signed)
Daily Progress Note   Patient Name: Ricardo [name]       Date: 10/07/2021 DOB: 09/05/1966  Age: 56 y.o. MRN#: [id number] Attending Physician: Lavina Hamman, MD Primary Care Physician: [name], MD Admit Date: 09/30/2021 Length of Stay: 6 days  Reason for Consultation/Follow-up: Establishing goals of care  HPI/Patient Profile:  56 y.o. male  with past medical history of osteoarthritis, class II obesity, generalized anxiety disorder, hyperlipidemia, unspecified sleep disorder, GERD, colon cancer with metastasis and peritoneal carcinomatosis who was recently admitted and discharged yesterday due to SBO requiring PEG placement for venting purposes    Again returns to the hospital due to recurrence of abdominal pain, multiple episodes of nausea, emesis, despite draining the PEG tube collecting bag 3-4 times yesterday similar to while he experience when he was admitted on the first of the month.   PMT was consulted for pain management.  Subjective:   I met today with [name].  His wife and son were at the bedside as well.  He reports being frustrated needing to repeat same story multiple times per day, but he was pleasant and cooperative and helping me understand his situation and everything that is been going on.  We discussed current management of his pain.  Reports it is predominantly in his abdomen and when asked to describe it further he reports it feels more like "congestion."  We discussed his goal of obtaining better symptom management and that he is very invested in plan for another round of chemotherapy.  He feels as though his body is telling him that it is "not time to give up."  He relies on his faith for his strength.  We discussed options for symptom management including increasing current opioid regimen versus trialing other adjuncts to see if we can gain better control of his symptoms by focusing on abdominal pain likely related to obstructive process.  He is  already on TPN at home and we discussed trial of octreotide to see if octreotide is beneficial in helping his symptoms as this is something could potentially be added to his TPN at home.  He is also on steroids once daily and we discussed increasing this to 2 times per day.  We reviewed potential options for working toward a pain management regimen for discharge including increasing fentanyl for baseline pain control, considering switch to methadone for long-acting agent, or potential for PCA at home if symptoms cannot be otherwise adequately managed.  Current regimen is basal rate 0.86m/hr and bolus dose 1 mg q 15 mins, in addition to transdermal fentanyl patch 225 mcg.  Review of PCA reveals he has had approximately 17 mg of Dilaudid in the last 24 hours.  Objective:   Vital Signs:  BP 121/84 (BP Location: Right Arm)    Pulse (!) 104    Temp (!) 97.5 F (36.4 C) (Oral)    Resp 16    Ht _0  (1.803 m)    Wt 101 kg    SpO2 96%    BMI 31.05 kg/m   Physical Exam: Physical Exam Vitals and nursing note reviewed.  Constitutional:      General: He is not in acute distress.    Appearance: He is ill-appearing.  HENT:     Head: Normocephalic and atraumatic.  Cardiovascular:     Rate and Rhythm: Normal rate.  Pulmonary:     Effort: No respiratory distress.     Breath sounds: No wheezing or rhonchi.  Skin:  General: Skin is warm and dry.  Neurological:     General: No focal deficit present.     Mental Status: He is alert.  Psychiatric:        Mood and Affect: Mood normal.        Behavior: Behavior normal.  +abdominal pain.   Palliative Assessment/Data: 40-50%   Assessment & Plan:   Impression: Present on Admission:  Intractable abdominal pain  GERD (gastroesophageal reflux disease)  Class 2 obesity  Colon cancer metastasized to multiple sites (HCC)  Nausea and vomiting  Leukopenia  Hypokalemia  56 year old male with abdominal pain secondary to small bowel obstruction and  peritoneal metastasis from metastatic colon cancer and carcinomatosis.  Presented with nausea and vomiting which seems to be better controlled.  He is expressed he is not ready for hospice, oncology on board with 1 more cycle of chemotherapy and plan to continue TPN.  Adjustments made to PCA for improved pain management, pain appears to be better managed today.  Overall long-term prognosis poor.  SUMMARY OF RECOMMENDATIONS   Pain: Cancer-related.  Will continue same PCA dosing for now.  For also working to see if we can better control his obstructive symptoms before we make further adjustments to opioid regimen.  Discussed with Dr. Posey Pronto and he is agreeable to trial of octreotide as well as increasing steroids to see if this better controls of his overall symptoms his primary complaint is feeling "full" and "congested."  Octreotide can be difficult to do at home, however, he is already on TPN so this could be added to his TPN if it is beneficial to him for symptom management.  Additionally, we discussed potential options for opioid regimen at home including increasing fentanyl patch, transitioning to methadone, or potential for PCA if pain cannot be controlled otherwise.  We will continue to monitor and adjust opioid regimen based upon his response to other interventions of octreotide and increasing steroid. PMT will continue to follow  Symptom Management:  Continue Dilaudid: Basal rate of 0.5 mg/h, bolus dose 1 mg every 15 minutes as needed, max hourly dose 4.5 mg/hr Continue Fentanyl patch 225 mcg/hr (72 hour patch) Addition of octreotide 20 mcg/h Increase dexamethasone to 4 mg twice daily  Code Status: Limited code  Prognosis: Unable to determine  Discharge Planning: To Be Determined  Discussed with: Patient, medical team, nursing team  Thank you for allowing [place] to participate in the care of Ricardo [name] PMT will continue to support holistically.  Greater than 50%  of this time was spent  counseling and coordinating care related to the above assessment and plan.  Micheline Rough, MD [place] Team [phone]

## 2021-10-08 LAB — BASIC METABOLIC PANEL
Anion gap: 5 (ref 5–15)
BUN: 33 mg/dL — ABNORMAL HIGH (ref 6–20)
CO2: 20 mmol/L — ABNORMAL LOW (ref 22–32)
Calcium: 7.6 mg/dL — ABNORMAL LOW (ref 8.9–10.3)
Chloride: 111 mmol/L (ref 98–111)
Creatinine, Ser: 0.91 mg/dL (ref 0.61–1.24)
GFR, Estimated: >60 mL/min (ref >60)
Glucose, Bld: 151 mg/dL — ABNORMAL HIGH (ref 70–99)
Potassium: 4.1 mmol/L (ref 3.5–5.1)
Sodium: 136 mmol/L (ref 135–145)

## 2021-10-08 LAB — PHOSPHORUS: Phosphorus: 1.7 mg/dL — ABNORMAL LOW (ref 2.5–4.6)

## 2021-10-08 LAB — GLUCOSE, CAPILLARY
Glucose-Capillary: 141 mg/dL — ABNORMAL HIGH (ref 70–99)
Glucose-Capillary: 77 mg/dL (ref 70–99)
Glucose-Capillary: 98 mg/dL (ref 70–99)
Glucose-Capillary: 134 mg/dL — ABNORMAL HIGH (ref 70–99)

## 2021-10-08 LAB — MAGNESIUM: Magnesium: 1.7 mg/dL (ref 1.7–2.4)

## 2021-10-08 MED ORDER — DEXTROSE 10 % IV SOLN: INTRAVENOUS | Status: AC

## 2021-10-08 MED ORDER — TRAVASOL 10 % IV SOLN
INTRAVENOUS | Status: AC
  Filled 2021-10-08: qty 1299.6

## 2021-10-08 MED ORDER — SODIUM PHOSPHATES 45 MMOLE/15ML IV SOLN
15.0000 mmol | Freq: Once | INTRAVENOUS | Status: AC
  Administered 2021-10-08: 11:00:00 15 mmol via INTRAVENOUS
  Filled 2021-10-08 (×2): qty 5

## 2021-10-08 MED ORDER — MAGNESIUM SULFATE 2 GM/50ML IV SOLN
2.0000 g | Freq: Once | INTRAVENOUS | Status: AC
  Administered 2021-10-08: 09:00:00 2 g via INTRAVENOUS
  Filled 2021-10-08: qty 50

## 2021-10-08 MED ORDER — FENTANYL 50 MCG/HR TD PT72
1.0000 | MEDICATED_PATCH | TRANSDERMAL | Status: DC
  Administered 2021-10-08 – 2021-10-14 (×3): 1 via TRANSDERMAL
  Filled 2021-10-08 (×3): qty 1

## 2021-10-08 MED ORDER — INSULIN ASPART 100 UNIT/ML IJ SOLN: 0.0000 [IU] | INTRAMUSCULAR | Status: DC

## 2021-10-08 MED ORDER — HYDROMORPHONE 1 MG/ML IV SOLN
INTRAVENOUS | Status: DC
  Administered 2021-10-08: 3.65 mg via INTRAVENOUS
  Administered 2021-10-08: 30 mg via INTRAVENOUS
  Administered 2021-10-08: 3.24 mg via INTRAVENOUS
  Administered 2021-10-09 (×2): 0 mg via INTRAVENOUS
  Administered 2021-10-09: 1 mg via INTRAVENOUS
  Filled 2021-10-08: qty 30

## 2021-10-08 NOTE — Assessment & Plan Note (Deleted)
Corrected.  Monitor.

## 2021-10-08 NOTE — Assessment & Plan Note (Signed)
Appreciate palliative care assistance for pain management as well as goals of care conversation.

## 2021-10-08 NOTE — Assessment & Plan Note (Signed)
Continue PPI ?

## 2021-10-08 NOTE — Progress Notes (Signed)
Brief Pharmacy note:   [id number] informed by IV Team RN that she was unable to hang TPN, problem with the tubing - reading air in the line.   Cyclic TPN with total volume  ~ 2272ml - will convert to continuous rate of 95 ml/hr  Minda Ditto PharmD [facility] [phone] 10/08/2021, 6:29 PM

## 2021-10-08 NOTE — Progress Notes (Signed)
When attempting to administer TPN, a problem occurred with the tubing. RN and I did troubleshooting and could not resolve the issue. Contacted pharmacy who presented alternate plan with D10.

## 2021-10-08 NOTE — Progress Notes (Signed)
Daily Progress Note   Patient Name: Ricardo [name]       Date: 10/08/2021 DOB: April 29, 1966  Age: 56 y.o. MRN#: [id number] Attending Physician: Lavina Hamman, MD Primary Care Physician: [name], MD Admit Date: 09/30/2021 Length of Stay: 7 days  Reason for Consultation/Follow-up: Establishing goals of care  HPI/Patient Profile:  56 y.o. male  with past medical history of osteoarthritis, class II obesity, generalized anxiety disorder, hyperlipidemia, unspecified sleep disorder, GERD, colon cancer with metastasis and peritoneal carcinomatosis who was recently admitted and discharged yesterday due to SBO requiring PEG placement for venting purposes    Again returns to the hospital due to recurrence of abdominal pain, multiple episodes of nausea, emesis, despite draining the PEG tube collecting bag 3-4 times yesterday similar to while he experience when he was admitted on the first of the month.   PMT was consulted for pain management.  Subjective:   I saw and examined Ricardo [name] today.  He was sitting in bed in no distress.  States that he feels better than he has for very long time.  Reports that he thinks medication changes have been very beneficial and his pain appears to be much better controlled and he is feeling more energized.  He was working on drinking a slushy during encounter and reports that he had 1 incident of pain that got worse overnight, but since that time he has been doing very well.  I reviewed his PCA usage.  Current regimen is basal rate 0.5mg /hr and bolus dose 1 mg q 15 mins, in addition to transdermal fentanyl patch 225 mcg.  Review of PCA reveals he has had approximately 18 mg of Dilaudid in the last 24 hours.  We discussed plan to increase fentanyl patch with next dose change and then titrate off of basal rate to see how he does.  Hope is that we can transition to a regimen he can discharge home with that includes oral rescue opioids in addition to octreotide  and his TPN.  Objective:   Vital Signs:  BP 133/86 (BP Location: Right Arm)    Pulse 71    Temp (!) 97.4 F (36.3 C)    Resp 18    Ht 5\' 11"  (1.803 m)    Wt 103.3 kg    SpO2 97%    BMI 31.76 kg/m   Physical Exam: Physical Exam Vitals and nursing note reviewed.  Constitutional:      General: He is not in acute distress.    Appearance: He is ill-appearing.  HENT:     Head: Normocephalic and atraumatic.  Cardiovascular:     Rate and Rhythm: Normal rate.  Pulmonary:     Effort: No respiratory distress.     Breath sounds: No wheezing or rhonchi.  Skin:    General: Skin is warm and dry.  Neurological:     General: No focal deficit present.     Mental Status: He is alert.  Psychiatric:        Mood and Affect: Mood normal.        Behavior: Behavior normal.  +abdominal pain.   Palliative Assessment/Data: 40-50%   Assessment & Plan:   Impression: Present on Admission:  Intractable abdominal pain  GERD (gastroesophageal reflux disease)  Class 2 obesity  Colon cancer metastasized to multiple sites (HCC)  Nausea and vomiting  Leukopenia  Hypokalemia  56 year old male with abdominal pain secondary to small bowel obstruction and peritoneal metastasis from metastatic colon cancer and carcinomatosis.  Presented with nausea and vomiting which seems to be better controlled.  He is expressed he is not ready for hospice, oncology on board with 1 more cycle of chemotherapy and plan to continue TPN.  Adjustments made to PCA for improved pain management, pain appears to be better managed today.  Overall long-term prognosis poor.  SUMMARY OF RECOMMENDATIONS   Pain: Cancer-related.  He reports this is improved today.  Plan to increase fentanyl patch to 250 mcg/h.  Will continue with PCA for rescue dosing but will plan to discontinue basal rate later this evening after he has had a chance to have higher dose of fentanyl in place.  He appears to have responded well to trial of octreotide and  increased steroids.  Octreotide can be difficult to do at home, however, he is already on TPN so this could be added to his current TPN.  We will continue to monitor and adjust opioid regimen based upon his response to other interventions of octreotide and increasing steroid. PMT will continue to follow  Symptom Management:  Continue Dilaudid: Basal rate of 0.5 mg/h for the next several hours but will then discontinue after increase fentanyl patch has some time to be in place, bolus dose 1 mg every 15 minutes as needed, max hourly dose 4.5 mg/hr Increase Fentanyl patch to 250 mcg/hr (72 hour patch) Increase octreotide to 30 mcg/h Continue dexamethasone 4 mg twice daily  Code Status: Limited code  Prognosis: Unable to determine  Discharge Planning: To Be Determined  Discussed with: Patient, medical team, nursing team  Thank you for allowing [place] to participate in the care of [name] PMT will continue to support holistically.  Micheline Rough, MD [place] Team [phone]

## 2021-10-08 NOTE — Assessment & Plan Note (Signed)
With history of her venting G-tube. Currently symptoms well controlled.

## 2021-10-08 NOTE — Assessment & Plan Note (Deleted)
Continue with  

## 2021-10-08 NOTE — Progress Notes (Signed)
PHARMACY - TOTAL PARENTERAL NUTRITION CONSULT NOTE   Indication: Small bowel obstruction  Patient Measurements: Height: '5\' 11"'  (180.3 cm) Weight: 103.3 kg (227 lb 11.8 oz) IBW/kg (Calculated) : 75.3 TPN AdjBW (KG): 85.7 Body mass index is 31.76 kg/m. Usual Weight: Weight was stable 06/19/21-09/14/21 and is now -32 lb since 09/14/21  Assessment:  61 yoM with PMH metastatic colon cancer with peritoneal carcinomatosis with recent admissions for SBO readmitted 1/17 for same. Venting PEG recently placed, but continues to have abdominal pain despite draining collection bag 3-4x daily. PICC line palced 1/19 d/t difficulty accessing implanted chemo port. Pharmacy consulted to manage TPN inpatient.  Glucose / Insulin: No hx DM. Hgb A1c 5.5% on 09/14/21 - CBG range 112 - 188, 4 units SSI required. BG slightly above goal while TPN infusing. Previously WNL when TPN off.  - Dexamethasone 61m IV increased from q24h to q12h on 1/24 will likely contribute to hyperglycemia Electrolytes: Phos (1.7) is low, CorrCa (8.5) is slightly low; Mg (1.7) on lower end of normal. Na now WNL on max concentration in TPN. -Chloride now on upper end of normal with max chloride in TPN Renal:  SCr WNL. BUN remains elevated but trending down. Hepatic: Tbili & ALT remain WNL. Albumin low; Alk Phos remains slightly elevated Trig: WNL on 1/20, 1/23 Intake / Output; MIVF:  - UOP: 500 mL - G-tube output: 7200 mL, significantly increased - MIVF: None. Discussed w/MD 1/24 - no MIVF orders for when TPN off at this time. GI Imaging: - 1/17: persistent SBO GI Surgeries / Procedures:  - Venting PEG placed 12/29  Central access: double lumen PICC in L brachial vein placed 1/19 in IR -1/23: Exchange of LUE non-functional DL PICC by IR TPN start date: 1/19  [facility] 2300 mL over 24hr with one hour taper up and down provides protein 125g/day, 2225 kcal/day.  Nutritional Goals: TPN cycled over 16 hours  provides 130 g protein, 2367 kcal  RD Assessment: 1/20 Estimated Needs Total Energy Estimated Needs: 2365-2600 kcal Total Protein Estimated Needs: 120-135 grams Total Fluid Estimated Needs: >/= 2.5 L/day  Current Nutrition:  Full liquids TPN - cycled over 16 hours  Plan:  Now: Mg sulfate 2 g IV once Na Phosphate 15 mmol IV once Increase SSI to moderate  At 1800: Continue TPN cycled over 16 hours. Will hold off on reducing cycling time due to hypophosphatemia Taper up at 76 mL/hr x1 hour, infuse at 152 mL/hr x14 hours, then taper down at 76 mL/hr x1 hour Electrolytes in TPN: Increase Phos, Ca. Add back Mg.  Na 150 mEq/L (max concentration) K 25 mEq/L Ca 4 mEq/L Mg 3 mEq/L Phos 12 mmol/L Cl:Ac - Change to 2:1 Add standard MVI and trace elements to TPN Continue mSSI and custom CBG check times of 2hr after starting TPN, 1 hr after stopping TPN, once in the middle of infusion and once while off off TPN.  No MIVF. Monitor TPN labs on Mon/Thurs.  MLenis Noon PharmD 10/08/21 7:01 AM

## 2021-10-08 NOTE — Assessment & Plan Note (Signed)
LDL 184.  Currently not on any medication.

## 2021-10-08 NOTE — Assessment & Plan Note (Addendum)
Hypokalemia  Baseline serum creatinine 0.7.  On presentation serum creatinine 1.12.  Treated with IV fluid and now serum creatinine back to normal 0.9. Potassium Corrected.  Monitor.

## 2021-10-08 NOTE — Assessment & Plan Note (Signed)
Metastatic colon cancer with intractable cancer-related pain. Patient has overall poor prognosis. Oncology following up with the patient. Palliative care also consulted. Patient has a venting G-tube for his small bowel obstruction. Not a candidate for further chemotherapy. Ideal candidate for residential hospice placement given poor symptom control as well as poor prognosis. Currently on TPN. Palliative care adding octreotide and increasing Decadron for pain control. Continue PCA pump as well as fentanyl.  Will likely require transition from PCA pump to fentanyl patch.

## 2021-10-08 NOTE — Progress Notes (Signed)
°  Progress Note   Patient: Ricardo [name] ELF:[id number] DOB: [birth year] DOA: 09/30/2021     7 DOS: the patient was seen and examined on 10/08/2021   Brief hospital course: No notes on file  Assessment and Plan Colon cancer metastasized to multiple sites [facility])- (present on admission) Metastatic colon cancer with intractable cancer-related pain. Patient has overall poor prognosis. Oncology following up with the patient. Palliative care also consulted. Patient has a venting G-tube for his small bowel obstruction. Not a candidate for further chemotherapy. Ideal candidate for residential hospice placement given poor symptom control as well as poor prognosis. Currently on TPN. Palliative care adding octreotide and increasing Decadron for pain control. Continue PCA pump as well as fentanyl.  Will likely require transition from PCA pump to fentanyl patch.  GERD (gastroesophageal reflux disease)- (present on admission) Continue PPI.  SBO (small bowel obstruction) ([place])- (present on admission) With history of her venting G-tube. Currently symptoms well controlled.  AKI (acute kidney injury) ([place])- (present on admission) Baseline serum creatinine 0.7.  On presentation serum creatinine 1.12.  Treated with IV fluid and now serum creatinine back to normal 0.9.  Hypokalemia- (present on admission) Corrected.  Monitor.  Obesity (BMI 30-39.9)- (present on admission) Body mass index is 31.76 kg/m.  Placing the patient at high risk of poor outcome  Hyperlipidemia- (present on admission) LDL 184.  Currently not on any medication.  Peripherally inserted central catheter (PICC) in place Continue with  Palliative care by specialist Appreciate palliative care assistance for pain management as well as goals of care conversation.     Subjective: Pain well controlled.  Able to sleep last night well.  No nausea or vomiting.  Objective Vitals:   10/08/21 1220 10/08/21 1433 10/08/21 1620  10/08/21 1939  BP:  133/86    Pulse:  71    Resp: 17 18 18 14   Temp:      TempSrc:      SpO2: 98% 99% 97% 99%  Weight:      Height:        General: Appear in mild distress, no Rash; Oral Mucosa Clear, moist. no Abnormal Neck Mass Or lumps, Conjunctiva normal  Cardiovascular: S1 and S2 Present, no Murmur, Respiratory: good respiratory effort, Bilateral Air entry present and CTA, no Crackles, no wheezes Abdomen: Bowel Sound present, Soft and no tenderness Extremities: no Pedal edema Neurology: alert and oriented to time, place, and person affect appropriate. no new focal deficit Gait not checked due to patient safety concerns   Data Reviewed:  There are no new results to review at this time.  Family Communication: None at bedside.  Disposition: Status is: Inpatient  Remains inpatient appropriate because: Ongoing IV narcotic pain regimen         Author: Berle Mull, MD 10/08/2021 7:59 PM  For on call review [email or web address].

## 2021-10-08 NOTE — Plan of Care (Signed)
  Problem: Activity: Goal: Risk for activity intolerance will decrease Outcome: Progressing   Problem: Pain Managment: Goal: General experience of comfort will improve Outcome: Progressing   Problem: Safety: Goal: Ability to remain free from injury will improve Outcome: Progressing   

## 2021-10-08 NOTE — Assessment & Plan Note (Signed)
Body mass index is 31.76 kg/m.  Placing the patient at high risk of poor outcome

## 2021-10-09 LAB — MAGNESIUM: Magnesium: 2.2 mg/dL (ref 1.7–2.4)

## 2021-10-09 LAB — COMPREHENSIVE METABOLIC PANEL
ALT: 44 U/L (ref 0–44)
AST: 28 U/L (ref 15–41)
Albumin: 2.7 g/dL — ABNORMAL LOW (ref 3.5–5.0)
Alkaline Phosphatase: 155 U/L — ABNORMAL HIGH (ref 38–126)
Anion gap: 7 (ref 5–15)
BUN: 30 mg/dL — ABNORMAL HIGH (ref 6–20)
CO2: 23 mmol/L (ref 22–32)
Calcium: 8.6 mg/dL — ABNORMAL LOW (ref 8.9–10.3)
Chloride: 101 mmol/L (ref 98–111)
Creatinine, Ser: 0.94 mg/dL (ref 0.61–1.24)
GFR, Estimated: >60 mL/min (ref >60)
Glucose, Bld: 131 mg/dL — ABNORMAL HIGH (ref 70–99)
Potassium: 4.2 mmol/L (ref 3.5–5.1)
Sodium: 131 mmol/L — ABNORMAL LOW (ref 135–145)
Total Bilirubin: 1 mg/dL (ref 0.3–1.2)
Total Protein: 7.5 g/dL (ref 6.5–8.1)

## 2021-10-09 LAB — GLUCOSE, CAPILLARY
Glucose-Capillary: 126 mg/dL — ABNORMAL HIGH (ref 70–99)
Glucose-Capillary: 150 mg/dL — ABNORMAL HIGH (ref 70–99)
Glucose-Capillary: 104 mg/dL — ABNORMAL HIGH (ref 70–99)
Glucose-Capillary: 121 mg/dL — ABNORMAL HIGH (ref 70–99)

## 2021-10-09 LAB — PHOSPHORUS: Phosphorus: 3.8 mg/dL (ref 2.5–4.6)

## 2021-10-09 MED ORDER — PANCRELIPASE (LIP-PROT-AMYL) 10440-39150 UNITS PO TABS
20880.0000 [IU] | ORAL_TABLET | Freq: Once | ORAL | Status: DC
  Filled 2021-10-09: qty 2

## 2021-10-09 MED ORDER — PANCRELIPASE (LIP-PROT-AMYL) 10440-39150 UNITS PO TABS
20880.0000 [IU] | ORAL_TABLET | Freq: Once | ORAL | Status: AC
  Administered 2021-10-09: 20880 [IU]
  Filled 2021-10-09: qty 2

## 2021-10-09 MED ORDER — HYDROMORPHONE HCL 1 MG/ML IJ SOLN
1.0000 mg | INTRAMUSCULAR | Status: DC | PRN
  Administered 2021-10-09 – 2021-10-11 (×13): 1 mg via INTRAVENOUS
  Filled 2021-10-09 (×13): qty 1

## 2021-10-09 MED ORDER — SODIUM BICARBONATE 650 MG PO TABS
650.0000 mg | ORAL_TABLET | Freq: Once | ORAL | Status: DC
  Filled 2021-10-09: qty 1

## 2021-10-09 MED ORDER — SODIUM BICARBONATE 650 MG PO TABS
650.0000 mg | ORAL_TABLET | Freq: Once | ORAL | Status: AC
  Administered 2021-10-09: 650 mg
  Filled 2021-10-09: qty 1

## 2021-10-09 MED ORDER — BOOST / RESOURCE BREEZE PO LIQD CUSTOM
1.0000 | Freq: Three times a day (TID) | ORAL | Status: DC
  Administered 2021-10-09 – 2021-10-13 (×9): 1 via ORAL

## 2021-10-09 MED ORDER — TRAVASOL 10 % IV SOLN
INTRAVENOUS | Status: AC
  Filled 2021-10-09: qty 1299.6

## 2021-10-09 NOTE — Progress Notes (Addendum)
PHARMACY - TOTAL PARENTERAL NUTRITION CONSULT NOTE   Indication: Small bowel obstruction  Patient Measurements: Height: _0  (180.3 cm) Weight: 99 kg (218 lb 4.1 oz) IBW/kg (Calculated) : 75.3 TPN AdjBW (KG): 85.7 Body mass index is 30.44 kg/m. Usual Weight: Weight was stable 06/19/21-09/14/21 and is now -32 lb since 09/14/21  Assessment:  8 yoM with PMH metastatic colon cancer with peritoneal carcinomatosis with recent admissions for SBO readmitted 1/17 for same. Venting PEG recently placed, but continues to have abdominal pain despite draining collection bag 3-4x daily. PICC line palced 1/19 d/t difficulty accessing implanted chemo port. Pharmacy consulted to manage TPN inpatient.  Significant Events: Did not receive TPN bag on 10/08/21 due to issues with TPN tubing, D10W hung at 95 mL/hr  Glucose / Insulin: No hx DM. Hgb A1c 5.5% on 09/14/21 - CBGs range 77 - 134 (at goal), 0 units SSI required in past 24 hours. - Dexamethasone 38m IV increased from q24h to q12h on 1/24 will likely contribute to hyperglycemia Electrolytes: Na low/decreased; All others, including Corrected Calcium now WNL.  Renal:  SCr WNL. BUN remains elevated but trending down. Hepatic: Tbili, AST/ALT remain WNL. Albumin low; Alk Phos remains slightly elevated Trig: WNL on 1/20, 1/23 Intake / Output; MIVF:  - UOP not charted - G-tube output: 5900 mL, decreased - MIVF: D10W at 966mhr initiated 1/25 PM since TPN unable to be infused due to tubing issues. Per previous discussion with MD 1/24 - no MIVF orders during hours when cyclic TPN off. GI Imaging: - 1/17: persistent SBO GI Surgeries / Procedures:  - Venting PEG placed 12/29  Central access: double lumen PICC in L brachial vein placed 1/19 in IR -1/23: Exchange of LUE non-functional DL PICC by IR TPN start date: 1/19  Home TPN formula received from [facility]2300 mL over 24hr with one hour taper up and down provides protein 125g/day, 2225  kcal/day.  Nutritional Goals: TPN cycled over 16 hours provides 130 g protein, 2367 kcal  RD Assessment: 10/03/2021 Estimated Needs Total Energy Estimated Needs: 2365-2600 kcal Total Protein Estimated Needs: 120-135 grams Total Fluid Estimated Needs: >/= 2.5 L/day  Current Nutrition:  Full liquids TPN  Plan:  At 1800: Resume TPN cycled over 16 hours.  Taper up at 76 mL/hr x1 hour, infuse at 152 mL/hr x14 hours, then taper down at 76 mL/hr x1 hour Electrolytes in TPN:   Na 150 mEq/L (max concentration) K 25 mEq/L Ca 4 mEq/L Mg 3 mEq/L Phos 10 mmol/L Cl:Ac 2:1 Add standard MVI and trace elements to TPN Continue mSSI and custom CBG check times of 2 hours after starting TPN, 1 hour after stopping TPN, once in the middle of infusion and once while off off TPN.  Discontinue D10W. Monitor TPN labs on Mon/Thurs. CMET, Mag, Phos in AM   JiLindell SparPharmD, BCPS Clinical Pharmacist  10/09/2021 8:10 AM

## 2021-10-09 NOTE — Plan of Care (Signed)
Problem: Education: Goal: Knowledge of General Education information will improve Description: Including pain rating scale, medication(s)/side effects and non-pharmacologic comfort measures Outcome: Progressing   Problem: Clinical Measurements: Goal: Respiratory complications will improve Outcome: Progressing   Problem: Clinical Measurements: Goal: Cardiovascular complication will be avoided Outcome: Leighton, RN 10/09/21 10:25 AM

## 2021-10-09 NOTE — Progress Notes (Signed)
Nutrition Follow-up  DOCUMENTATION CODES:   Obesity unspecified  INTERVENTION:  - will order Boost Breeze TID, each supplement provides 250 kcal and 9 grams of protein. - continue cyclic TPN per Pharmacist.   NUTRITION DIAGNOSIS:   Increased nutrient needs related to chronic illness, cancer and cancer related treatments as evidenced by estimated needs. -ongoing  GOAL:   Patient will meet greater than or equal to 90% of their needs -met with cyclic TPN and PO intakes.  MONITOR:   PO intake, Supplement acceptance, Labs, Weight trends, I & O's, Other (Comment) (TPN regimen)   ASSESSMENT:   56 y.o. male with medical history of osteoarthritis, obesity, generalized anxiety disorder, HLD, unspecified sleep disorder, GERD, colon cancer with metastasis and peritoneal carcinomatosis. He was recently admitted for SBO requiring vent PEG placement. He returned to the ED due to abdominal pain and multiple episodes of N/V.  Diet advanced to CLD on 1/19 at 1514 and to [address] on 1/22 at 1229. The only documented meal completion percentages documented since RD visit on 1/20 were 75% of lunch and 80% of dinner on 1/23.  Patient is receiving cyclic TPN Y22 hours/day which is providing 2367 kcal and 130 grams protein.    Patient sleeping at the time of visit. His wife was at bedside. She shares that patient has been drinking liquids and doing well, but that he tried grits today and consumed them too quickly which caused him to need to vomit. After vomiting he felt much better.   No nutrition-related questions or concerns but let wife know if she or patient have questions or concerns to let RN know and RD is happy to stop back.   Weight today is -8 lb compared to weight on 1/20. He is noted to be -9.2 L since admission.   Palliative Care is following and note from yesterday reviewed. Patient remains inpatient for ongoing need for IV narcotic pain regimen.    Labs reviewed; CBG: 126 mg/dl, Na: 131  mmol/l, BUN: 30 mg/dl, Ca: 8.6 mg/dl, Alk Phos: 155 u/l (slightly up from 1/24), triglycerides: 140 mg/dl on 1/23.  Medications reviewed; sliding scale novolog, 40 mg IV protonix BID, 15 mmol IV NaPhos x1 run 1/25.  IVF; D10 @ 95 ml/hr started 1/25 at 1915 x23 hours (2280 ml, 743 kcal).   Diet Order:   Diet Order             Diet full liquid Room service appropriate? Yes; Fluid consistency: Thin  Diet effective now                   EDUCATION NEEDS:   Education needs have been addressed  Skin:  Skin Assessment: Reviewed RN Assessment  Last BM:  1/23 (tyep 2, small amount)  Height:   Ht Readings from Last 1 Encounters:  09/30/21 '5\' 11"'  (1.803 m)    Weight:   Wt Readings from Last 1 Encounters:  10/09/21 99 kg     Estimated Nutritional Needs:  Kcal:  2365-2600 kcal Protein:  120-135 grams Fluid:  >/= 2.5 L/day     Jarome Matin, MS, RD, LDN Inpatient Clinical Dietitian RD pager # available in [place]  After hours/weekend pager # available in [facility]

## 2021-10-09 NOTE — Progress Notes (Signed)
[facility] PROGRESS NOTE  Patient: Ricardo [name] OTR:[id number]   PCP: [name], MD DOB: [birth year]   DOA: 09/30/2021   DOS: 10/09/2021    Subjective: Pain well controlled.  Reported episode of nausea and vomiting after heavy breakfast this morning.  No fever no chills.  Objective:  Vitals:   10/09/21 1418 10/09/21 1545  BP: 126/85   Pulse: 65   Resp: 18 12  Temp: 97.8 F (36.6 C)   SpO2: 100% 97%    S1-S2 present. Clear to auscultation percussion bowel sound present but Diffuse tenderness.  Assessment and plan: Consideration pain. Management per palliative care.  Venting G-tube issues. Declogging protocol ordered.  Insulin coverage for TPN as well as steroid-induced hyperglycemia. Patient does not want to use insulin.  For now we will monitor.  Author: Berle Mull, MD [facility] 10/09/2021 6:42 PM   If 7PM-7AM, please contact night-coverage at [email or web address]

## 2021-10-09 NOTE — Progress Notes (Signed)
Daily Progress Note   Patient Name: Ricardo [name]       Date: 10/09/2021 DOB: September 07, 1966  Age: 56 y.o. MRN#: [id number] Attending Physician: Lavina Hamman, MD Primary Care Physician: [name], MD Admit Date: 09/30/2021 Length of Stay: 8 days  Reason for Consultation/Follow-up: Establishing goals of care  HPI/Patient Profile:  56 y.o. male  with past medical history of osteoarthritis, class II obesity, generalized anxiety disorder, hyperlipidemia, unspecified sleep disorder, GERD, colon cancer with metastasis and peritoneal carcinomatosis who was recently admitted and discharged yesterday due to SBO requiring PEG placement for venting purposes    Again returns to the hospital due to recurrence of abdominal pain, multiple episodes of nausea, emesis, despite draining the PEG tube collecting bag 3-4 times yesterday similar to while he experience when he was admitted on the first of the month.   PMT was consulted for pain management.  Subjective:   I saw and examined Ricardo [name] today.  He was sitting in bed in no distress.  Continues to report feeling better overall but states he "overdid it" with breakfast and had episode of vomiting earlier today.  He feels the current regimen including increase in fentanyl patch, initiation of octreotide, and increase in steroids has been very beneficial.  I reviewed his PCA usage.  He has only needed 1 dose via the PCA throughout the day today.  Initially, we discussed plan to transition off of PCA tomorrow morning, however, I then received call from his RN that he was tired of beeping from end-tidal CO2 on the PCA and we therefore transitioned to intermittent IV Dilaudid as needed.  Discussed goal of getting home, however, his wife remains concerned about his transition over to oral analgesics.  We discussed plan to see how he does with these earlier in the day tomorrow so we can make adjustments as necessary over the next day or 2 while working  transition home.  I also discussed with liaison from infusion pharmacy.  He is on special mix of TPN that would not be readily available over the weekend.  We will discuss further with Dr. Posey Pronto, but I expressed that I thought working for discharge on Monday was reasonable expectation.  Objective:   Vital Signs:  BP 126/85 (BP Location: Right Arm)    Pulse 65    Temp 97.8 F (36.6 C) (Oral)    Resp 12    Ht 5\' 11"  (1.803 m)    Wt 99 kg    SpO2 97%    BMI 30.44 kg/m   Physical Exam: Physical Exam Vitals and nursing note reviewed.  Constitutional:      General: He is not in acute distress.    Appearance: He is ill-appearing.  HENT:     Head: Normocephalic and atraumatic.  Cardiovascular:     Rate and Rhythm: Normal rate.  Pulmonary:     Effort: No respiratory distress.     Breath sounds: No wheezing or rhonchi.  Skin:    General: Skin is warm and dry.  Neurological:     General: No focal deficit present.     Mental Status: He is alert.  Psychiatric:        Mood and Affect: Mood normal.        Behavior: Behavior normal.  +abdominal pain.   Palliative Assessment/Data: 40-50%   Assessment & Plan:   Impression: Present on Admission:  Intractable abdominal pain  GERD (gastroesophageal reflux disease)  Colon cancer metastasized to multiple sites (  HCC)  Nausea and vomiting  Hypokalemia  Obesity (BMI 30-39.9)  SBO (small bowel obstruction) (HCC)  Hyperlipidemia  AKI (acute kidney injury) ([place])  56 year old male with abdominal pain secondary to small bowel obstruction and peritoneal metastasis from metastatic colon cancer and carcinomatosis.  Presented with nausea and vomiting which seems to be better controlled.  He is expressed he is not ready for hospice, oncology on board with 1 more cycle of chemotherapy and plan to continue TPN.  Adjustments made to PCA for improved pain management, pain appears to be better managed today.  Overall long-term prognosis poor.  SUMMARY OF  RECOMMENDATIONS   Pain: Cancer-related.  He reports this is improved today.  Continue fentanyl patch at 250 mcg/h.  We will transition off of PCA and order Dilaudid 1 mg every 2 hours as needed for breakthrough pain.  He has responded very well to octreotide and steroids.  Octreotide can be difficult to do at home, however, he is already on TPN and infusion liaison has confirmed this could be added to his current TPN.  We will continue to monitor and adjust opioid regimen based upon his response to other interventions of octreotide and increasing steroid. PMT will continue to follow  Symptom Management:  Dilaudid 1 mg every 2 hours as needed.  D/C PCA. Continue Fentanyl patch to 250 mcg/hr (72 hour patch) Continue octreotide 25 mcg/h Continue dexamethasone 4 mg twice daily  Code Status: Limited code  Prognosis: Unable to determine  Discharge Planning: To Be Determined  Discussed with: Patient, medical team, nursing team  Thank you for allowing [place] to participate in the care of Ricardo [name] PMT will continue to support holistically.  Micheline Rough, MD [facility] [facility] [phone]

## 2021-10-09 NOTE — Plan of Care (Signed)

## 2021-10-09 NOTE — Progress Notes (Signed)
Patient has not been getting much sleep due to PCA beeping and asked to not be disturbed until after 6:15. Notified NT for CBG and VSs and notified IV team RN.

## 2021-10-10 DIAGNOSIS — R109 Unspecified abdominal pain: Secondary | ICD-10-CM | POA: Diagnosis not present

## 2021-10-10 LAB — COMPREHENSIVE METABOLIC PANEL
ALT: 57 U/L — ABNORMAL HIGH (ref 0–44)
AST: 31 U/L (ref 15–41)
Albumin: 2.5 g/dL — ABNORMAL LOW (ref 3.5–5.0)
Alkaline Phosphatase: 140 U/L — ABNORMAL HIGH (ref 38–126)
Anion gap: 6 (ref 5–15)
BUN: 29 mg/dL — ABNORMAL HIGH (ref 6–20)
CO2: 22 mmol/L (ref 22–32)
Calcium: 8.1 mg/dL — ABNORMAL LOW (ref 8.9–10.3)
Chloride: 106 mmol/L (ref 98–111)
Creatinine, Ser: 0.92 mg/dL (ref 0.61–1.24)
GFR, Estimated: >60 mL/min (ref >60)
Glucose, Bld: 169 mg/dL — ABNORMAL HIGH (ref 70–99)
Potassium: 4.6 mmol/L (ref 3.5–5.1)
Sodium: 134 mmol/L — ABNORMAL LOW (ref 135–145)
Total Bilirubin: 0.7 mg/dL (ref 0.3–1.2)
Total Protein: 6.7 g/dL (ref 6.5–8.1)

## 2021-10-10 LAB — GLUCOSE, CAPILLARY
Glucose-Capillary: 128 mg/dL — ABNORMAL HIGH (ref 70–99)
Glucose-Capillary: 141 mg/dL — ABNORMAL HIGH (ref 70–99)
Glucose-Capillary: 103 mg/dL — ABNORMAL HIGH (ref 70–99)
Glucose-Capillary: 96 mg/dL (ref 70–99)
Glucose-Capillary: 145 mg/dL — ABNORMAL HIGH (ref 70–99)

## 2021-10-10 LAB — PHOSPHORUS: Phosphorus: 2.7 mg/dL (ref 2.5–4.6)

## 2021-10-10 LAB — MAGNESIUM: Magnesium: 2 mg/dL (ref 1.7–2.4)

## 2021-10-10 MED ORDER — TRAVASOL 10 % IV SOLN
INTRAVENOUS | Status: AC
  Filled 2021-10-10: qty 1299.6

## 2021-10-10 NOTE — Progress Notes (Signed)
[facility] PROGRESS NOTE  Patient: Ricardo [name] ZOX:[id number]   PCP: [name], MD DOB: 1966/09/01   DOA: 09/30/2021   DOS: 10/10/2021    Subjective: No nausea no vomiting no fever no chills.  Pain remains well controlled.  G-tube is draining well.  Objective:  Vitals:   10/10/21 1354 10/10/21 1530  BP: 120/81 117/76  Pulse: 62 63  Resp: 18 15  Temp:  98 F (36.7 C)  SpO2: 98% 94%    S1-S2 present. Clear to auscultation. Bowel sound present On room air 99-100%.  Assessment and plan: Pain controlled. Appreciate assistance from palliative care.  Management per palliative care.  Hypoxia. Intermittent. Currently on room air. Will wean to room air.  Dispo. Patient will be ready to be discharged on Monday. Home health agency will not be able to initiate his TPN over the weekend.  Author: Berle Mull, MD [facility] 10/10/2021 7:55 PM   If 7PM-7AM, please contact night-coverage at [email or web address]

## 2021-10-10 NOTE — Progress Notes (Signed)
Palliative Care  Progress Note:  I assessed Ricardo [name] at the bedside. He had recently received a dose of Dilaudid, waiting 4hrs between doses. He stated that his pain had increased with movement to a 7/10 but was down to a 5/10, which he described as "manageable". He stated the pain is primarily in his L lower abdomen. He stated that he was "doing better" and was still agreeable with the plan of titrating his Fentanyl patch and switching to oral medication over the weekend. Dr. Domingo Cocking notified.   Will continue current care plan. Please call the Palliative Care Team with any questions/concerns. [phone]

## 2021-10-10 NOTE — Progress Notes (Addendum)
PHARMACY - TOTAL PARENTERAL NUTRITION CONSULT NOTE  ° °Indication: Small bowel obstruction ° °Patient Measurements: °Height: 5' 11" (180.3 cm) °Weight: 99 kg (218 lb 4.1 oz) °IBW/kg (Calculated) : 75.3 °TPN AdjBW (KG): 85.7 °Body mass index is 30.44 kg/m². °Usual Weight: Weight was stable 06/19/21-09/14/21 and is now -32 lb since 09/14/21 ° °Assessment:  °55 yoM with PMH metastatic colon cancer with peritoneal carcinomatosis with recent admissions for SBO readmitted 1/17 for same. Venting PEG recently placed, but continues to have abdominal pain despite draining collection bag 3-4x daily. PICC line palced 1/19 d/t difficulty accessing implanted chemo port. Pharmacy consulted to manage TPN inpatient. ° °Significant Events: °Did not receive TPN bag on 10/08/21 due to issues with TPN tubing, D10W hung at 95 mL/hr ° °Glucose / Insulin: No hx DM. Hgb A1c 5.5% on 09/14/21 °- CBGs range 128 - 169 (goal <180). mSSI ordered 4x/day - pt has refused all doses since 1/25. MD aware. °- Dexamethasone 4mg IV q12h will likely contribute to hyperglycemia °Electrolytes: Na (134) remains low despite max Na concentration in TPN. K (4.6) on upper end of normal. All other lytes WNL including CorrCa (9.3) °Renal:  SCr WNL. BUN remains elevated but trending down. °Hepatic: Tbili, AST/ALT remain WNL. Albumin low; Alk Phos remains slightly elevated  but stable °Trig: WNL on 1/20, 1/23 °Intake / Output; MIVF: Strict I/O not charted. °- UOP not charted °- G-tube output: 3865 mL + 2 unmeasured emesis °- MIVF: Per previous discussion with MD 1/24 - no MIVF orders during hours when cyclic TPN off. °GI Imaging: °- 1/17: persistent SBO °GI Surgeries / Procedures:  °- Venting PEG placed 12/29 ° °Central access: double lumen PICC in L brachial vein placed 1/19 in IR °-1/23: Exchange of LUE non-functional DL PICC by IR °TPN start date: 1/19 ° °Home TPN formula received from [facility]: °2300 mL over 24hr with one hour taper up and down provides protein 125g/day,  2225 kcal/day. ° °Nutritional Goals: °TPN cycled over 16 hours provides 130 g protein, 2367 kcal ° °RD Assessment: 10/03/2021 °Estimated Needs °Total Energy Estimated Needs: 2365-2600 kcal °Total Protein Estimated Needs: 120-135 grams °Total Fluid Estimated Needs: >/= 2.5 L/day ° °Current Nutrition:  °Full liquids °TPN ° °Plan:  °At 1800: °Continue TPN cycled over 16 hours.  °Taper up at 76 mL/hr x1 hour, infuse at 152 mL/hr x14 hours, then taper down at 76 mL/hr x1 hour °Since BG WNL on this cycle and pt remains on IV steroids with refusal of insulin, will hold off on further advancing cycle °Electrolytes in TPN:  Decrease K °Na 150 mEq/L (max concentration) °K 15 mEq/L °Ca 4 mEq/L °Mg 3 mEq/L °Phos 10 mmol/L °Cl:Ac 2:1 °Add standard MVI and trace elements to TPN °Continue CBG check 3x/day. mSSI discontinued by MD. °No MIVF. °Monitor TPN labs on Mon/Thurs. Recheck lytes tomorrow AM. ° °Mary M Swayne, PharmD °10/10/21 °9:36 AM °

## 2021-10-10 NOTE — Plan of Care (Signed)

## 2021-10-10 NOTE — Progress Notes (Addendum)
HEMATOLOGY-ONCOLOGY PROGRESS NOTE  ASSESSMENT AND PLAN: 1.  Abdominal pain secondary to small bowel obstruction and peritoneal metastasis 2.  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  Nausea and vomiting 4.  Hypokalemia 5.  GERD 6.  Protein calorie malnutrition 7.  Mild neutropenia secondary to recent chemotherapy 8.  Goals of care discussion  -PCA has been stopped.  He is receiving as needed Dilaudid through his IV by nursing.  Overall, pain is well controlled at this time.  Appreciate assistance from the palliative care team to continue to adjust his pain medication with goal of discharge early next week. -Continue TPN -No plans for chemotherapy as an inpatient at this time.  Scheduling message has been sent to arrange for outpatient follow-up in our office later next week to reevaluate and to make further decisions regarding chemotherapy. -Goals of treatment are for palliation of symptoms.  He understands that his disease is not curable.  He agrees to limited code to include CPR and ACLS medications, but no defibrillation or cardioversion or mechanical ventilation.    Mikey Bussing, DNP, AGPCNP-BC, AOCNP  SUBJECTIVE: He is now off the Dilaudid PCA.  He is receiving Dilaudid as needed by IV from nursing.  Care is following closely and continuing to adjust pain medication with the goal of possible discharge early next week.  He reports pain is well controlled at this time.  He is not having any nausea or vomiting today.  Oncology History Overview Note  Cancer Staging metastatic cecal cancer Staging form: Colon and Rectum, AJCC 8th Edition - Clinical stage from 11/29/2020: Stage IVC (cTX, cN2, pM1c) - Signed by [name], MD on 12/04/2020 Stage prefix: Initial diagnosis Histologic grade (G): G3 Histologic grading system: 4 grade system    metastatic cecal cancer  11/27/2020 Imaging   CT Angio CAP  IMPRESSION: 1. Thickening of the terminal ileum with associated  proximal mid to distal small bowel obstruction. Findings could be due to an ileitis versus malignancy. Nonspecific mesenteric edema could be due to engorgement versus metastases. No associated bowel perforation. Recommend endoscopy for further evaluation. 2. Indeterminate right lower quadrant lymphadenopathy. 3. Scattered colonic diverticulosis with no acute diverticulitis. 4. Stable right hepatic lobe subcentimeter hyperdensity likely represents a hepatic hemangioma. 5. No acute vascular abnormality. Aortic Atherosclerosis (ICD10-I70.0) - mild. 6. No acute intrathoracic abnormality.   11/28/2020 Imaging   CT AP  IMPRESSION: 1. There is masslike, circumferential thickening of the terminal ileum and cecal base near the ileocecal valve and abnormally enlarged lymph nodes in the right lower quadrant mesentery adjacent to the terminal ileum measuring up to 2.3 x 1.4 cm. 2. There is extensive omental and peritoneal nodularity and caking throughout the abdomen. 3. Findings are highly concerning for primary colon malignancy with nodal and peritoneal metastatic disease. 4. Small volume perihepatic and perisplenic ascites. 5. The small bowel is generally decompressed, with full some fluid-filled, nondistended loops throughout. There is transit of oral enteric contrast to the terminal ileum. No evidence of overt bowel obstruction at this time. Esophagogastric tube is position with tip and side port below the diaphragm. 6. Atelectasis or consolidation of the dependent bilateral lung bases, new compared to prior examination.   Aortic Atherosclerosis (ICD10-I70.0).     11/29/2020 Surgery   EXPLORATORY LAPAROTOMY, PARTIAL BOWEL RESECTION, POSSIBLE OSTOMY CREATION, PERITIONEAL BIOPSY, INSERTION OF GASTROSTOMY TUBE by Dr [name]   11/29/2020 Initial Biopsy   FINAL MICROSCOPIC DIAGNOSIS:   AB. OMENTUM, BIOPSY AND PARTIAL OMENTECTOMY:  - Poorly differentiated adenocarcinoma  with focal signet ring  cell  features.    COMMENT:   Immunohistochemistry (IHC) for CK20 and CDX-2 is strong and diffusely  positive.  CK7, TTF-1, Synaptophysin, Chromogranin and CD56 are  negative.  The immunophenotype is compatible with origin from the lower  gastrointestinal tract.  IHC for MMR will be reported separately.  Case  preliminarily discussed with Dr. [name] on 12/02/2020.   At the request of Dr. [name], [phone] was reviewed in retrospect.  Review of the submitted sections confirms the presence of acute  appendicitis.  No malignancy is identified.    11/29/2020 Cancer Staging   Staging form: Colon and Rectum, AJCC 8th Edition - Clinical stage from 11/29/2020: Stage IVC (cTX, cN2, pM1c) - Signed by [name], MD on 12/04/2020 Stage prefix: Initial diagnosis Histologic grade (G): G3 Histologic grading system: 4 grade system   12/04/2020 Initial Diagnosis   Cancer of ascending colon metastatic to intra-abdominal lymph node ([place])    Chemotherapy   FOLFOX q2weeks    12/18/2020 - 02/15/2021 Chemotherapy      Patient is on Antibody Plan: COLORECTAL BEVACIZUMAB Q14D    01/03/2021 Imaging   CT A/P IMPRESSION: 1. Wall thickening of the terminal ileum again seen. Fluid-filled distal small bowel, ascending, and transverse colon without obstruction. 2. Equivocal improvement in omental and peritoneal metastatic disease with slightly improved small volume perihepatic and perisplenic ascites. Right lower quadrant mesenteric adenopathy is similar or mildly improved. 3. Sigmoid colonic diverticulosis. Mild mural wall thickening in the sigmoid, no acute diverticulitis. 4. Gastrostomy tube in place, balloon normally positioned in the stomach. 5. Chronic bilateral L5 pars interarticularis defects with trace anterolisthesis of L5 on S1. Chronic avascular necrosis of the left femoral head.   01/16/2021 Genetic Testing   Negative genetic testing. CTNNA1 [id number] VUS, RAD51D p.G140E  VUS and TSC1 p.R768H VUS found on the CancerNext-Expanded+RNAinsight.  The CancerNext-Expanded gene panel offered by [facility] and includes sequencing and rearrangement analysis for the following 77 genes: AIP, ALK, APC*, ATM*, AXIN2, BAP1, BARD1, BLM, BMPR1A, BRCA1*, BRCA2*, BRIP1*, CDC73, CDH1*, CDK4, CDKN1B, CDKN2A, CHEK2*, CTNNA1, DICER1, FANCC, FH, FLCN, GALNT12, KIF1B, LZTR1, MAX, MEN1, MET, MLH1*, MSH2*, MSH3, MSH6*, MUTYH*, NBN, NF1*, NF2, NTHL1, PALB2*, PHOX2B, PMS2*, POT1, PRKAR1A, PTCH1, PTEN*, RAD51C*, RAD51D*, RB1, RECQL, RET, SDHA, SDHAF2, SDHB, SDHC, SDHD, SMAD4, SMARCA4, SMARCB1, SMARCE1, STK11, SUFU, TMEM127, TP53*, TSC1, TSC2, VHL and XRCC2 (sequencing and deletion/duplication); EGFR, EGLN1, HOXB13, KIT, MITF, PDGFRA, POLD1, and POLE (sequencing only); EPCAM and GREM1 (deletion/duplication only). DNA and RNA analyses performed for * genes. The report date is Jan 16, 2021.   02/24/2021 Imaging   CT AP  IMPRESSION: 1. Interval resolution of previously seen small volume ascites throughout the abdomen and pelvis. There is redemonstrated, ill-defined stranding and thickening of the peritoneal surfaces and omentum, which is somewhat diminished compared to prior examination. 2. Interval improvement in right lower quadrant mesocolon lymph nodes. 3. Findings are consistent with treatment response of nodal and peritoneal metastatic disease. 4. Unchanged, matted appearing thickening of the terminal ileum and cecal base. 5. Sigmoid diverticulosis with wall thickening of the mid to distal sigmoid, similar to prior examination. Findings are most suggestive of chronic sequelae of diverticulitis.   Aortic Atherosclerosis (ICD10-I70.0).   03/13/2021 -  Chemotherapy   Patient is on Treatment Plan : COLORECTAL FOLFOXIRI + Bevacizumab q14d     05/13/2021 Procedure   OPERATIVE PROCEDURE: Laparoscopy and peritoneal biopsy.  SURGEON: Merlyn Albert. Clovis Riley, MD   05/13/2021 Pathology Results  Final Pathologic Diagnosis    A. PERITONEAL, BIOPSY :              Metastatic adenocarcinoma.               See comment.    Comment    The biopsies show predominately fibrosis  and fibroadipose tissue with a small focus of moderately differentiated adenocarcinoma. Given the patient's history, the findings favor metastasis of the patient's known colonic cancer.     07/01/2021 Imaging   CT CAP  IMPRESSION: Chest Impression:   1. No evidence of thoracic metastasis.   Abdomen / Pelvis Impression:   1. Thickening through the terminal ileum similar to prior. No ileocecal lymphadenopathy. 2. One focus linear thickening the peritoneum adjacent to loops of small bowel in the ventral abdomen which extend to the proximal sigmoid colon are more prominent than prior and concerning for residual peritoneal carcinoma. 3. No evidence of solid organ metastasis in the abdomen pelvis.      REVIEW OF SYSTEMS:   Review of Systems  Constitutional:  Negative for chills and fever.  HENT: Negative.    Eyes: Negative.   Respiratory: Negative.    Cardiovascular: Negative.   Gastrointestinal:  Positive for abdominal pain. Negative for nausea and vomiting.       Abdominal pain is improving with current pain medication regimen  Skin: Negative.   Neurological: Negative.   Endo/Heme/Allergies: Negative.   Psychiatric/Behavioral: Negative.     I have reviewed the past medical history, past surgical history, social history and family history with the patient and they are unchanged from previous note.   PHYSICAL EXAMINATION: ECOG PERFORMANCE STATUS: 2 - Symptomatic, <50% confined to bed  Vitals:   10/09/21 2118 10/10/21 0615  BP: 136/86 116/80  Pulse: (!) 56 69  Resp: 20 14  Temp: 97.7 F (36.5 C) 97.7 F (36.5 C)  SpO2: 96% 98%   Filed Weights   10/04/21 0421 10/08/21 0513 10/09/21 0620  Weight: 101 kg 103.3 kg 99 kg    Intake/Output from previous day: 01/26 0701 - 01/27 0700 In: 4426  [P.O.:840; I.V.:3226] Out: 4105 [Drains:3685; Blood:420]  Physical Exam Vitals reviewed.  Constitutional:      General: He is not in acute distress. HENT:     Head: Normocephalic.     Mouth/Throat:     Mouth: Mucous membranes are moist.     Pharynx: No oropharyngeal exudate.  Eyes:     General: No scleral icterus. Cardiovascular:     Rate and Rhythm: Normal rate and regular rhythm.     Pulses: Normal pulses.  Pulmonary:     Effort: Pulmonary effort is normal.     Breath sounds: Normal breath sounds.  Abdominal:     Comments: Mildly distended. No pain with palpation.   Skin:    General: Skin is warm and dry.  Neurological:     Mental Status: He is alert and oriented to person, place, and time.  Psychiatric:        Mood and Affect: Mood normal.        Behavior: Behavior normal.        Thought Content: Thought content normal.        Judgment: Judgment normal.    LABORATORY DATA:  I have reviewed the data as listed CMP Latest Ref Rng & Units 10/10/2021 10/09/2021 10/08/2021  Glucose 70 - 99 mg/dL 169(H) 131(H) 151(H)  BUN 6 - 20 mg/dL 29(H) 30(H) 33(H)  Creatinine 0.61 - 1.24 mg/dL  0.92 0.94 0.91  Sodium 135 - 145 mmol/L 134(L) 131(L) 136  Potassium 3.5 - 5.1 mmol/L 4.6 4.2 4.1  Chloride 98 - 111 mmol/L 106 101 111  CO2 22 - 32 mmol/L 22 23 20(L)  Calcium 8.9 - 10.3 mg/dL 8.1(L) 8.6(L) 7.6(L)  Total Protein 6.5 - 8.1 g/dL 6.7 7.5 -  Total Bilirubin 0.3 - 1.2 mg/dL 0.7 1.0 -  Alkaline Phos 38 - 126 U/L 140(H) 155(H) -  AST 15 - 41 U/L 31 28 -  ALT 0 - 44 U/L 57(H) 44 -    Lab Results  Component Value Date   WBC 12.2 (H) 10/07/2021   HGB 14.2 10/07/2021   HCT 44.2 10/07/2021   MCV 87.0 10/07/2021   PLT 317 10/07/2021   NEUTROABS 9.1 (H) 10/07/2021    Lab Results  Component Value Date   CEA1 1.9 11/29/2020   FUX323 4 11/30/2020    X-ray chest PA and lateral  Result Date: 09/14/2021 CLINICAL DATA:  56 year old male with a history of colon cancer, recurrent SBO  s/p venting gastrostomy tube, who presents to the emergency department today for evaluation of abdominal pain. Of note, patient recently admitted to the hospital for recurrent SBO and had a gastrostomy tube placed. He was actually discharged from the hospital yesterday. Since then he states he has been draining his PEG tube and he continues to have a significant amount of output so he has not drained in several hours. He is now complaining of severe abdominal pain, distention, nausea vomiting. EXAM: CHEST - 2 VIEW COMPARISON:  01/04/2021. FINDINGS: Increased opacity at the left lung base compared to the prior exam, consistent with atelectasis, pneumonia or a combination. Remainder of the lungs is clear. Small left pleural effusion.  No pneumothorax. Cardiac silhouette normal in size. No mediastinal or hilar masses or evidence of adenopathy. Stable right internal jugular Port-A-Cath. Skeletal structures are grossly intact. IMPRESSION: 1. Left lower lung opacity consistent with a combination of atelectasis/pneumonia and a small effusion, new compared to the prior chest radiograph. Electronically Signed   By: Lajean Manes M.D.   On: 09/14/2021 10:59   Abd 1 View (KUB)  Result Date: 09/14/2021 CLINICAL DATA:  History of colon cancer with recurrent small bowel obstruction. Patient presents with abdominal pain. EXAM: ABDOMEN - 1 VIEW COMPARISON:  09/08/21 FINDINGS: Left upper quadrant gastrostomy tube identified. Since the previous exam there is been interval improvement in gaseous distension the of the small and large bowel loops. No new findings. IMPRESSION: Interval improvement in gaseous distension of the small and large bowel loops compared with 09/08/2021. Electronically Signed   By: Kerby Moors M.D.   On: 09/14/2021 10:57   IR GASTROSTOMY TUBE MOD SED  Result Date: 09/11/2021 CLINICAL DATA:  History of metastatic colon carcinoma with persistent small-bowel obstruction and request to place a venting  gastrostomy tube for symptomatic relief. EXAM: PERCUTANEOUS GASTROSTOMY TUBE PLACEMENT ANESTHESIA/SEDATION: Moderate (conscious) sedation was employed during this procedure. A total of Versed 4.0 mg and Fentanyl 100 mcg was administered intravenously by radiology nursing. Moderate Sedation Time: 15 minutes. The patient's level of consciousness and vital signs were monitored continuously by radiology nursing throughout the procedure under my direct supervision. CONTRAST:  79m OMNIPAQUE IOHEXOL 300 MG/ML  SOLN MEDICATIONS: 2 g IV Ancef. IV antibiotic was administered in an appropriate time interval prior to needle puncture of the skin. During the procedure the patient received 0.5 mg IV glucagon. FLUOROSCOPY TIME:  2 minutes and 24 seconds.  51.0 mGy. PROCEDURE: The procedure, risks, benefits, and alternatives were explained to the patient. Questions regarding the procedure were encouraged and answered. The patient understands and consents to the procedure. A time-out was performed prior to initiating the procedure. A 5-French catheter was advanced through the patient's mouth under fluoroscopy into the esophagus and to the level of the stomach. This catheter was used to insufflate the stomach with air under fluoroscopy. The abdominal wall was prepped with chlorhexidine in a sterile fashion, and a sterile drape was applied covering the operative field. A sterile gown and sterile gloves were used for the procedure. Local anesthesia was provided with 1% Lidocaine. A skin incision was made in the upper abdominal wall. Under fluoroscopy, an 18 gauge trocar needle was advanced into the stomach. Contrast injection was performed to confirm intraluminal position of the needle tip. A single T tack was then deployed in the lumen of the stomach. This was brought up to tension at the skin surface. Over a guidewire, a 9-French sheath was advanced into the lumen of the stomach. The wire was left in place as a safety wire. A loop  snare device from a percutaneous gastrostomy kit was then advanced into the stomach. A floppy guide wire was advanced through the orogastric catheter under fluoroscopy in the stomach. The loop snare advanced through the percutaneous gastric access was used to snare the guide wire. This allowed withdrawal of the loop snare out of the patient's mouth by retraction of the orogastric catheter and wire. A 20-French bumper retention gastrostomy tube was looped around the snare device. It was then pulled back through the patient's mouth. The retention bumper was brought up to the anterior gastric wall. The T tack suture was cut at the skin. The exiting gastrostomy tube was cut to appropriate length and a feeding adapter applied. The catheter was injected with contrast material to confirm position and a fluoroscopic spot image saved. The tube was then flushed with saline. A dressing was applied over the gastrostomy exit site. COMPLICATIONS: None. FINDINGS: The stomach distended well with air allowing safe placement of the gastrostomy tube. After placement, the tip of the gastrostomy tube lies in the body of the stomach. IMPRESSION: Percutaneous gastrostomy with placement of a 20-French bumper retention tube in the body of the stomach. Electronically Signed   By: Aletta Edouard M.D.   On: 09/11/2021 16:11   DG CHEST PORT 1 VIEW  Result Date: 09/18/2021 CLINICAL DATA:  Left PICC placement EXAM: PORTABLE CHEST 1 VIEW COMPARISON:  09/14/2021 FINDINGS: Single frontal view of the chest demonstrates stable right chest wall port. Left-sided PICC tip overlies the superior vena cava. Cardiac silhouette is stable. Stable left basilar consolidation. Small effusion is suspected. Right chest is clear. No pneumothorax. IMPRESSION: 1. Left-sided PICC tip overlying superior vena cava. 2. Continued left basilar consolidation and likely small effusion. Electronically Signed   By: Randa Ngo M.D.   On: 09/18/2021 17:32   DG ABD ACUTE  2+V W 1V CHEST  Result Date: 09/30/2021 CLINICAL DATA:  Abdominal pain. EXAM: DG ABDOMEN ACUTE WITH 1 VIEW CHEST COMPARISON:  Abdominal radiographs 09/14/2021 CT abdomen and pelvis 09/02/2021 FINDINGS: Large body habitus. Right chest wall porta catheter with tip overlying the superior vena cava/right atrial junction. Cardiac silhouette and mediastinal contours are within normal limits. Mild calcification within aortic arch. The lungs are clear. No pleural effusion or pneumothorax. A gastrostomy tube again overlies the left upper quadrant. There are air-fluid levels within multiple loops of bowel  on upright view. There may be mild distention of small-bowel loops measuring up to approximately 4 cm in caliber No portal venous gas or pneumatosis is seen. No subdiaphragmatic free air on upright view. IMPRESSION: There are air-fluid levels throughout the small bowel. There is likely dilatation of some small bowel loops at least 4 cm. Note is made of small bowel obstruction with similar small bowel dilatation due to adhesions to the anterior abdominal wall seen on prior CT. Gastrostomy tube is again noted. Electronically Signed   By: Yvonne Kendall M.D.   On: 09/30/2021 09:29   ECHOCARDIOGRAM COMPLETE  Result Date: 10/01/2021    ECHOCARDIOGRAM REPORT   Patient Name:   DUWANE GEWIRTZ Folden Date of Exam: 10/01/2021 Medical Rec #:  [id number]       Height:       71.0 in Accession #:    [id number]      Weight:       257.9 lb Date of Birth:  10/02/1965        BSA:          2.349 m Patient Age:    89 years        BP:           132/79 mmHg Patient Gender: M               HR:           112 bpm. Exam Location:  Inpatient Procedure: 2D Echo, Cardiac Doppler, Color Doppler and Intracardiac            Opacification Agent Indications:    Abnormal ECG R94.31  History:        Patient has no prior history of Echocardiogram examinations.                 Risk Factors:Dyslipidemia. GERD.  Sonographer:    Darlina Sicilian RDCS Referring Phys:  [id number] DAVID MANUEL [place]  1. Left ventricular ejection fraction, by estimation, is 65 to 70%. The left ventricle has normal function. The left ventricle has no regional wall motion abnormalities. Left ventricular diastolic parameters are consistent with Grade I diastolic dysfunction (impaired relaxation).  2. Right ventricular systolic function is normal. The right ventricular size is normal.  3. The mitral valve is normal in structure. No evidence of mitral valve regurgitation. No evidence of mitral stenosis.  4. The aortic valve is normal in structure. Aortic valve regurgitation is not visualized. No aortic stenosis is present.  5. The inferior vena cava is normal in size with greater than 50% respiratory variability, suggesting right atrial pressure of 3 mmHg. FINDINGS  Left Ventricle: Left ventricular ejection fraction, by estimation, is 65 to 70%. The left ventricle has normal function. The left ventricle has no regional wall motion abnormalities. Definity contrast agent was given IV to delineate the left ventricular  endocardial borders. The left ventricular internal cavity size was normal in size. There is no left ventricular hypertrophy. Left ventricular diastolic parameters are consistent with Grade I diastolic dysfunction (impaired relaxation). Normal left ventricular filling pressure. Right Ventricle: The right ventricular size is normal. No increase in right ventricular wall thickness. Right ventricular systolic function is normal. Left Atrium: Left atrial size was normal in size. Right Atrium: Right atrial size was normal in size. Pericardium: There is no evidence of pericardial effusion. Mitral Valve: The mitral valve is normal in structure. No evidence of mitral valve regurgitation. No evidence of mitral valve stenosis. Tricuspid Valve: The tricuspid valve is normal  in structure. Tricuspid valve regurgitation is not demonstrated. No evidence of tricuspid stenosis. Aortic Valve: The  aortic valve is normal in structure. Aortic valve regurgitation is not visualized. No aortic stenosis is present. Pulmonic Valve: The pulmonic valve was normal in structure. Pulmonic valve regurgitation is not visualized. No evidence of pulmonic stenosis. Aorta: The aortic root is normal in size and structure. Venous: The inferior vena cava is normal in size with greater than 50% respiratory variability, suggesting right atrial pressure of 3 mmHg. IAS/Shunts: No atrial level shunt detected by color flow Doppler.  LEFT VENTRICLE PLAX 2D LVIDd:         5.00 cm   Diastology LVIDs:         2.80 cm   LV e' medial:    6.53 cm/s LV PW:         0.90 cm   LV E/e' medial:  7.0 LV IVS:        1.00 cm   LV e' lateral:   6.74 cm/s LVOT diam:     2.80 cm   LV E/e' lateral: 6.8 LV SV:         45 LV SV Index:   19 LVOT Area:     6.16 cm  RIGHT VENTRICLE RV S prime:     9.14 cm/s TAPSE (M-mode): 1.5 cm LEFT ATRIUM         Index LA diam:    2.50 cm 1.06 cm/m  AORTIC VALVE LVOT Vmax:   49.00 cm/s LVOT Vmean:  30.900 cm/s LVOT VTI:    0.073 m  AORTA Ao Root diam: 3.80 cm Ao Asc diam:  3.50 cm MITRAL VALVE MV Area (PHT): 3.58 cm    SHUNTS MV Decel Time: 212 msec    Systemic VTI:  0.07 m MV E velocity: 45.80 cm/s  Systemic Diam: 2.80 cm MV A velocity: 60.00 cm/s MV E/A ratio:  0.76 Skeet Latch MD Electronically signed by Skeet Latch MD Signature Date/Time: 10/01/2021/12:05:12 PM    Final    IR PICC REPLACEMENT LEFT INC IMG GUIDE  Result Date: 10/06/2021 INDICATION: 56 year old male with nonfunctional PICC EXAM: IMAGE GUIDED EXCHANGE OF PERIPHERAL INSERTED CENTRAL CATHETER MEDICATIONS: None ANESTHESIA/SEDATION: None FLUOROSCOPY TIME:  Fluoroscopy Time: 0 minutes 48 seconds (10 mGy). COMPLICATIONS: None PROCEDURE: The patient was advised of the possible risks and complications and agreed to undergo the procedure. The patient was then brought to the angiographic suite for the procedure. Image was acquired. This demonstrates  that the previously placed PICC head been withdrawn to the SVC, directed into the lateral wall. The left arm and indwelling central catheter was prepped with chlorhexidine, draped in the usual sterile fashion using maximum barrier technique (cap and mask, sterile gown, sterile gloves, large sterile sheet, hand hygiene and cutaneous antisepsis). The indwelling catheter was cut at the hub, and the fragment was withdrawn, and then cut again at a shorter length. A Nitrex 80 cm 0.018 wire was introduced through the catheter into the vein. Catheter fragment was removed on the wire. Over the wire, a 5 [place] dual lumen power injectable PICC measuring 46 cm was advanced to the upper right atrium, 2 vertebral body below the carina. Fluoroscopy during the procedure and fluoro spot radiograph confirms appropriate catheter position. The catheter was flushed and covered with asterile dressing. Patient tolerated the procedure well and remained hemodynamically stable throughout. No complications were encountered and no significant blood loss. IMPRESSION: Image guided exchange of left upper extremity nonfunctional PICC for a new  46 cm dual lumen PICC. Signed, Dulcy Fanny. Dellia Nims, RPVI [facility] Electronically Signed   By: Corrie Mckusick D.O.   On: 10/06/2021 09:39   [place] EKG SITE RITE  Result Date: 09/30/2021 If Site Rite image not attached, placement could not be confirmed due to current cardiac rhythm.  [place] EKG Site Rite  Result Date: 09/18/2021 If Site Rite image not attached, placement could not be confirmed due to current cardiac rhythm.  [place] EKG SITE RITE  Result Date: 09/18/2021 If Site Rite image not attached, placement could not be confirmed due to current cardiac rhythm.    No future appointments.     LOS: 9 days   Addendum  I have seen the patient, examined him. I agree with the assessment and and plan and have edited the notes.   Mr. Hardenbrook is  clinically stable, pain is still the main issue, Dr. Domingo Cocking is transitioning him to oral narcotics, he is off pain pump, still requires IV Dilaudid a few times a day, on high dose fentanyl patch.  He is more awake, hallucination has resolved.  The goal is to go home next week. We are holding his chemo for now, and he may not be a candidate for more chemo. I plan to f/u him within a week after discharge. Please call me when he is ready to be discharged next week.   [name]  10/10/2021

## 2021-10-11 LAB — GLUCOSE, CAPILLARY
Glucose-Capillary: 143 mg/dL — ABNORMAL HIGH (ref 70–99)
Glucose-Capillary: 89 mg/dL (ref 70–99)
Glucose-Capillary: 102 mg/dL — ABNORMAL HIGH (ref 70–99)
Glucose-Capillary: 160 mg/dL — ABNORMAL HIGH (ref 70–99)

## 2021-10-11 LAB — PHOSPHORUS: Phosphorus: 2.8 mg/dL (ref 2.5–4.6)

## 2021-10-11 LAB — BASIC METABOLIC PANEL
Anion gap: 6 (ref 5–15)
BUN: 26 mg/dL — ABNORMAL HIGH (ref 6–20)
CO2: 21 mmol/L — ABNORMAL LOW (ref 22–32)
Calcium: 7.9 mg/dL — ABNORMAL LOW (ref 8.9–10.3)
Chloride: 107 mmol/L (ref 98–111)
Creatinine, Ser: 0.82 mg/dL (ref 0.61–1.24)
GFR, Estimated: >60 mL/min (ref >60)
Glucose, Bld: 305 mg/dL — ABNORMAL HIGH (ref 70–99)
Potassium: 4 mmol/L (ref 3.5–5.1)
Sodium: 134 mmol/L — ABNORMAL LOW (ref 135–145)

## 2021-10-11 LAB — MAGNESIUM: Magnesium: 2 mg/dL (ref 1.7–2.4)

## 2021-10-11 MED ORDER — HYDROMORPHONE HCL 1 MG/ML IJ SOLN
1.0000 mg | INTRAMUSCULAR | Status: DC | PRN
Start: 2021-10-11 — End: 2021-10-14
  Administered 2021-10-11 – 2021-10-12 (×2): 1 mg via INTRAVENOUS
  Filled 2021-10-11 (×2): qty 1

## 2021-10-11 MED ORDER — INSULIN ASPART 100 UNIT/ML IJ SOLN
0.0000 [IU] | INTRAMUSCULAR | Status: DC
  Administered 2021-10-11: 3 [IU] via SUBCUTANEOUS
  Administered 2021-10-12 – 2021-10-13 (×4): 2 [IU] via SUBCUTANEOUS

## 2021-10-11 MED ORDER — HYDROMORPHONE HCL 2 MG PO TABS
4.0000 mg | ORAL_TABLET | ORAL | Status: DC | PRN
  Administered 2021-10-11 – 2021-10-12 (×3): 4 mg via ORAL
  Filled 2021-10-11 (×4): qty 2

## 2021-10-11 MED ORDER — TRAVASOL 10 % IV SOLN
INTRAVENOUS | Status: AC
  Filled 2021-10-11: qty 1299.6

## 2021-10-11 NOTE — Plan of Care (Signed)
?  Problem: Clinical Measurements: ?Goal: Ability to maintain clinical measurements within normal limits will improve ?Outcome: Progressing ?  ?Problem: Activity: ?Goal: Risk for activity intolerance will decrease ?Outcome: Progressing ?  ?Problem: Nutrition: ?Goal: Adequate nutrition will be maintained ?Outcome: Progressing ?  ?Problem: Pain Managment: ?Goal: General experience of comfort will improve ?Outcome: Progressing ?  ?

## 2021-10-11 NOTE — Plan of Care (Signed)
  Problem: Clinical Measurements: Goal: Ability to maintain clinical measurements within normal limits will improve Outcome: Progressing   Problem: Pain Managment: Goal: General experience of comfort will improve Outcome: Progressing   Problem: Safety: Goal: Ability to remain free from injury will improve Outcome: Progressing   

## 2021-10-11 NOTE — Progress Notes (Addendum)
[facility] PROGRESS NOTE  Patient: Ricardo [name] RXY:[id number]   PCP: [name], MD DOB: 04/09/1966   DOA: 09/30/2021   DOS: 10/11/2021    Subjective: Pain somewhat marginally controlled.  No nausea no vomiting.  Did not receive his medication until late last night.  Objective:  Vitals:   10/11/21 1658 10/11/21 2056  BP: 129/87 124/86  Pulse: 62 66  Resp: 14 16  Temp: 97.7 F (36.5 C) 97.7 F (36.5 C)  SpO2: 100% 100%    S1-S2 present. Clear to auscultation  bowel sounds sluggish.  Assessment and plan: Mild hyperglycemia. On sliding scale insulin. Discussed with the patient.  Patient currently agreeable.  Pain control. Currently agreeable to transition to p.o. medications.  Discussed with palliative care and pharmacy. BMP shows sodium 134, glucose was 305 although on recheck with CBG was normal.  Serum creatinine stable.   Author: Berle Mull, MD [facility] 10/11/2021 9:23 PM   If 7PM-7AM, please contact night-coverage at [email or web address]

## 2021-10-11 NOTE — Progress Notes (Signed)
PHARMACY - TOTAL PARENTERAL NUTRITION CONSULT NOTE   Indication: Small bowel obstruction  Patient Measurements: Height: '5\' 11"'  (180.3 cm) Weight: 107 kg (235 lb 14.3 oz) IBW/kg (Calculated) : 75.3 TPN AdjBW (KG): 85.7 Body mass index is 32.9 kg/m. Usual Weight: Weight was stable 06/19/21-09/14/21 and is now -32 lb since 09/14/21  Assessment:  24 yoM with PMH metastatic colon cancer with peritoneal carcinomatosis with recent admissions for SBO readmitted 1/17 for same. Venting PEG recently placed, but continues to have abdominal pain despite draining collection bag 3-4x daily. PICC line palced 1/19 d/t difficulty accessing implanted chemo port. Pharmacy consulted to manage TPN inpatient.  Significant Events: Did not receive TPN bag on 10/08/21 due to issues with TPN tubing, D10W hung at 95 mL/hr  Glucose / Insulin: No hx DM. Hgb A1c 5.5% on 09/14/21 - CBGs range 96 - 305 (goal <180). BG WNL off of TPN, but now hyperglycemic during TPN infusion.  -Pt was previously refusing insulin administration when order parameters met since 1/25. BG had been WNL, MD discontinued SSI on 1/27. - Dexamethasone 47m IV q12h is likely contributing to hyperglycemia Electrolytes: Na (134) remains slightly low despite max Na concentration in TPN. All other lytes WNL, including CorrCa (9.1) Renal:  SCr WNL. BUN remains elevated but trending down. Hepatic: Tbili, AST/ALT remain WNL. Albumin low; Alk Phos remains slightly elevated but stable Trig: WNL on 1/20, 1/23 Intake / Output; MIVF: Strict I/O not charted - UOP unmeasured x2 - G-tube output: 6000 mL - Per previous discussion with MD 1/24 - no MIVF orders during hours when cyclic TPN off. GI Imaging: - 1/17: persistent SBO GI Surgeries / Procedures:  - Venting PEG placed 12/29  Central access: double lumen PICC in L brachial vein placed 1/19 in IR -1/23: Exchange of LUE non-functional DL PICC by IR TPN start date: 1/19  Home TPN formula received from  [facility] 2300 mL over 24hr with one hour taper up and down provides protein 125g/day, 2225 kcal/day.  Nutritional Goals: TPN cycled over 16 hours provides 130 g protein, 2367 kcal  RD Assessment: 10/03/2021 Estimated Needs Total Energy Estimated Needs: 2365-2600 kcal Total Protein Estimated Needs: 120-135 grams Total Fluid Estimated Needs: >/= 2.5 L/day  Current Nutrition:  Full liquids TPN  Plan:  Discussed patients refusal of insulin administration and concerns for hyperglycemia on cyclic TPN and IV steroids with Dr. PPosey Prontoand Dr. FDomingo Cocking May need to consider transition back to continuous TPN infusion for lower glucose infusion rate or tapering steroids if pt refuses insulin. Will resume mSSI orders at this time.   At 1800: Continue TPN cycled over 16 hours.  Taper up at 76 mL/hr x1 hour, infuse at 152 mL/hr x14 hours, then taper down at 76 mL/hr x1 hour Since patient has been refusing insulin administration and hyperglycemic reading while TPN infusing - will hold off on further advancing cycle. Electrolytes in TPN:  No change Na 150 mEq/L (max concentration) K 15 mEq/L Ca 4 mEq/L Mg 3 mEq/L Phos 10 mmol/L Cl:Ac 2:1 Add standard MVI and trace elements to TPN Continue mSSI and CBG checks at 4 times/day (2 checks while TPN infusing, 2 off of TPN infusion). Monitor for compliance.  No MIVF Monitor TPN labs on Mon/Thurs. BMP tomorrow AM.  MLenis Noon PharmD 10/11/21 11:13 AM

## 2021-10-12 LAB — BASIC METABOLIC PANEL
Anion gap: 4 — ABNORMAL LOW (ref 5–15)
BUN: 24 mg/dL — ABNORMAL HIGH (ref 6–20)
CO2: 24 mmol/L (ref 22–32)
Calcium: 7.8 mg/dL — ABNORMAL LOW (ref 8.9–10.3)
Chloride: 104 mmol/L (ref 98–111)
Creatinine, Ser: 0.74 mg/dL (ref 0.61–1.24)
GFR, Estimated: >60 mL/min (ref >60)
Glucose, Bld: 173 mg/dL — ABNORMAL HIGH (ref 70–99)
Potassium: 3.6 mmol/L (ref 3.5–5.1)
Sodium: 132 mmol/L — ABNORMAL LOW (ref 135–145)

## 2021-10-12 LAB — GLUCOSE, CAPILLARY
Glucose-Capillary: 139 mg/dL — ABNORMAL HIGH (ref 70–99)
Glucose-Capillary: 101 mg/dL — ABNORMAL HIGH (ref 70–99)
Glucose-Capillary: 125 mg/dL — ABNORMAL HIGH (ref 70–99)
Glucose-Capillary: 146 mg/dL — ABNORMAL HIGH (ref 70–99)

## 2021-10-12 MED ORDER — HYDROMORPHONE HCL 2 MG PO TABS
8.0000 mg | ORAL_TABLET | ORAL | Status: DC | PRN
  Administered 2021-10-12 – 2021-10-14 (×14): 8 mg via ORAL
  Filled 2021-10-12 (×15): qty 4

## 2021-10-12 MED ORDER — TRAVASOL 10 % IV SOLN
INTRAVENOUS | Status: AC
  Filled 2021-10-12: qty 1299.6

## 2021-10-12 MED ORDER — PANTOPRAZOLE 2 MG/ML SUSPENSION
40.0000 mg | Freq: Every day | ORAL | Status: DC
  Administered 2021-10-13 – 2021-10-14 (×2): 40 mg via ORAL
  Filled 2021-10-12 (×3): qty 20

## 2021-10-12 MED ORDER — TRAVASOL 10 % IV SOLN
INTRAVENOUS | Status: DC
  Filled 2021-10-12 (×3): qty 1299.6

## 2021-10-12 MED ORDER — POTASSIUM CHLORIDE 10 MEQ/50ML IV SOLN
10.0000 meq | INTRAVENOUS | Status: AC
  Administered 2021-10-12 (×2): 10 meq via INTRAVENOUS
  Filled 2021-10-12 (×2): qty 50

## 2021-10-12 NOTE — Progress Notes (Signed)
PHARMACY - TOTAL PARENTERAL NUTRITION CONSULT NOTE   Indication: Small bowel obstruction  Patient Measurements: Height: '5\' 11"'  (180.3 cm) Weight: 106.8 kg (235 lb 7.2 oz) IBW/kg (Calculated) : 75.3 TPN AdjBW (KG): 85.7 Body mass index is 32.84 kg/m. Usual Weight: Weight was stable 06/19/21-09/14/21 and is now -32 lb since 09/14/21  Assessment:  50 yoM with PMH metastatic colon cancer with peritoneal carcinomatosis with recent admissions for SBO readmitted 1/17 for same. Venting PEG recently placed, but continues to have abdominal pain despite draining collection bag 3-4x daily. PICC line palced 1/19 d/t difficulty accessing implanted chemo port. Pharmacy consulted to manage TPN inpatient.  Significant Events: Did not receive TPN bag on 10/08/21 due to issues with TPN tubing, D10W hung at 95 mL/hr 1/28: Discussed patients refusal of insulin administration and concerns for hyperglycemia on cyclic TPN and IV steroids with Dr. Posey Pronto and Dr. Domingo Cocking. Pt now agreeable for insulin administration and compliant overnight   Glucose / Insulin: No hx DM. Hgb A1c 5.5% on 09/14/21 - CBGs range 89 - 160 (goal <180). mSSI 4x/day, 5 units required in 24 hours -Pt was previously refusing insulin administration when order parameters met, now agreeable to take it if needed - Dexamethasone 57m IV q12h ordered. Potential to start tapering dose Electrolytes: Na (132) remains low despite max Na concentration in TPN. K (3.6) on lower end of range. CorrCa (9) WNL.  Renal:  SCr WNL. BUN continues to trend down Hepatic: Tbili, AST/ALT remain WNL. Albumin low; Alk Phos remains slightly elevated but stable Trig: WNL on 1/20, 1/23 Intake / Output; MIVF: Strict I/O not charted - UOP  1675 mL + unmeasured x3 - G-tube output: 6400 mL - Per previous discussion with MD 1/24 - no MIVF orders during hours when cyclic TPN off. GI Imaging: - 1/17: persistent SBO GI Surgeries / Procedures:  - Venting PEG placed 12/29  Central  access: double lumen PICC in L brachial vein placed 1/19 in IR -1/23: Exchange of LUE non-functional DL PICC by IR TPN start date: 1/19  Home TPN formula received from [facility] 2300 mL over 24hr with one hour taper up and down provides protein 125g/day, 2225 kcal/day.  Nutritional Goals: TPN cycled over 16 hours provides 130 g protein, 2367 kcal  RD Assessment: 10/03/2021 Estimated Needs Total Energy Estimated Needs: 2365-2600 kcal Total Protein Estimated Needs: 120-135 grams Total Fluid Estimated Needs: >/= 2.5 L/day  Current Nutrition:  Full liquids TPN at goal rate  Plan:  Now: KCl 10 mEq IV x2   At 1800: Advance TPN cycle to infuse over 14 hours Taper up at 88 mL/hr x1 hour, infuse at 175 mL/hr x12 hours, then taper down at 88 mL/hr x1 hour Electrolytes in TPN:  Increase K Na 150 mEq/L (max concentration) K 20 mEq/L Ca 4 mEq/L Mg 3 mEq/L Phos 10 mmol/L Cl:Ac 2:1 Add standard MVI and trace elements to TPN Continue mSSI and CBG checks at 4 times/day (2 checks while TPN infusing, 2 off of TPN infusion). Monitor for compliance.  No MIVF Monitor TPN labs on Mon/Thurs.   MLenis Noon PharmD 10/12/21 9:25 AM

## 2021-10-12 NOTE — Plan of Care (Signed)
°  Problem: Health Behavior/Discharge Planning: Goal: Ability to manage health-related needs will improve Outcome: Progressing   Problem: Clinical Measurements: Goal: Ability to maintain clinical measurements within normal limits will improve Outcome: Progressing   Problem: Activity: Goal: Risk for activity intolerance will decrease Outcome: Progressing   Problem: Nutrition: Goal: Adequate nutrition will be maintained Outcome: Progressing   Problem: Coping: Goal: Level of anxiety will decrease Outcome: Progressing   Problem: Elimination: Goal: Will not experience complications related to bowel motility Outcome: Progressing   Problem: Pain Managment: Goal: General experience of comfort will improve Outcome: Progressing

## 2021-10-12 NOTE — Progress Notes (Signed)
Daily Progress Note   Patient Name: Ricardo [name]       Date: 10/12/2021 DOB: [birth year]  Age: 56 y.o. MRN#: [id number] Attending Physician: Lavina Hamman, MD Primary Care Physician: [name], MD Admit Date: 09/30/2021 Length of Stay: 11 days  Reason for Consultation/Follow-up: Pain control  HPI/Patient Profile:  56 y.o. male  with past medical history of osteoarthritis, class II obesity, generalized anxiety disorder, hyperlipidemia, unspecified sleep disorder, GERD, colon cancer with metastasis and peritoneal carcinomatosis who was recently admitted and discharged yesterday due to SBO requiring PEG placement for venting purposes    Again returns to the hospital due to recurrence of abdominal pain, multiple episodes of nausea, emesis, despite draining the PEG tube collecting bag 3-4 times yesterday similar to while he experience when he was admitted on the first of the month.   PMT was consulted for pain management.  Subjective:  I saw and examined Ricardo [name] today.  He was sitting in bed in no distress.  He continues to feel the current regimen of fentanyl patch, octreotide, and steroids has been very beneficial but he has not found the current oral rescue medication to be beneficial.  When he takes oral medication, he does not think that it is sufficient to relieve his pain.  We discussed increasing oral rescue dose to dilaudid 8mg  to see if it is more effective for him.  We also discussed the relief of backup IV dose for use if oral medication is not sufficient to relieve his pain.  He continues to be very appreciative of all the care he has received.  States, "do not give up on me.  I've got a lot of life left."  I have previously discussed with liaison from infusion pharmacy.  He is on special mix of TPN that would not be readily available over the weekend.  Working toward hopeful discharge at beginning of week.  Objective:   Vital Signs:  BP 121/76 (BP Location: Right  Arm)    Pulse (!) 58    Temp 97.7 F (36.5 C) (Oral)    Resp 16    Ht 5\' 11"  (1.803 m)    Wt 106.8 kg    SpO2 100%    BMI 32.84 kg/m   Physical Exam: Physical Exam Vitals and nursing note reviewed.  Constitutional:      General: He is not in acute distress.    Appearance: He is ill-appearing.  HENT:     Head: Normocephalic and atraumatic.  Cardiovascular:     Rate and Rhythm: Normal rate.  Pulmonary:     Effort: No respiratory distress.     Breath sounds: No wheezing or rhonchi.  Skin:    General: Skin is warm and dry.  Neurological:     General: No focal deficit present.     Mental Status: He is alert.  Psychiatric:        Mood and Affect: Mood normal.        Behavior: Behavior normal.  +abdominal pain.   Palliative Assessment/Data: 40-50%   Assessment & Plan:   Impression: Present on Admission:  Intractable abdominal pain  GERD (gastroesophageal reflux disease)  Colon cancer metastasized to multiple sites (HCC)  Nausea and vomiting  Hypokalemia  Obesity (BMI 30-39.9)  SBO (small bowel obstruction) (HCC)  Hyperlipidemia  AKI (acute kidney injury) ([place])  56 year old male with abdominal pain secondary to small bowel obstruction and peritoneal metastasis from metastatic colon cancer and carcinomatosis.  Presented with nausea and  vomiting which seems to be better controlled.  He is expressed he is not ready for hospice, oncology on board with 1 more cycle of chemotherapy and plan to continue TPN.  Adjustments made to PCA for improved pain management, pain appears to be better managed today.  Overall long-term prognosis poor.  SUMMARY OF RECOMMENDATIONS   Pain: Cancer-related.  Continues to report pain is fairly well controlled, but this has been with a combination of oral and IV medications.  Continue fentanyl patch at 250 mcg/h.  Current dose of oral medication has not been relieving pain effectively.  We will therefore increase this to Dilaudid 8 mg p.o. to see if this  is effective breakthrough pain medication for him.  I also left a backup dose of 1 mg of IV Dilaudid to be given 45 to 60 minutes after his oral rescue medication of the oral rescue medication is insufficient to relieve his pain.  Overall, he has responded very well to octreotide and steroids.  Octreotide can be difficult to do at home, however, he is already on TPN and infusion liaison has confirmed this could be added to his current TPN.   PMT will continue to follow  Symptom Management:  Increase first-line medication to Dilaudid 8 mg p.o. every 3 hours as needed for breakthrough pain.  Dilaudid 1 mg every 3 hours as needed as second line medication if oral medication is ineffective Continue Fentanyl patch to 250 mcg/hr (72 hour patch) Continue octreotide 25 mcg/h Continue dexamethasone 4 mg twice daily.  Could consider titrating down steroids soon to lowest dose that provides symptom relief.  Code Status: Limited code  Prognosis: Unable to determine  Discharge Planning: To Be Determined  Discussed with: Patient, medical team, nursing team  Thank you for allowing [place] to participate in the care of Ricardo [name] PMT will continue to support holistically.  Micheline Rough, MD [place] Team [phone]

## 2021-10-12 NOTE — Progress Notes (Signed)
Daily Progress Note   Patient Name: Ricardo [name]       Date: 10/12/2021 DOB: [birth year]  Age: 56 y.o. MRN#: [id number] Attending Physician: Lavina Hamman, MD Primary Care Physician: [name], MD Admit Date: 09/30/2021 Length of Stay: 11 days  Reason for Consultation/Follow-up: Pain control  HPI/Patient Profile:  56 y.o. male  with past medical history of osteoarthritis, class II obesity, generalized anxiety disorder, hyperlipidemia, unspecified sleep disorder, GERD, colon cancer with metastasis and peritoneal carcinomatosis who was recently admitted and discharged yesterday due to SBO requiring PEG placement for venting purposes    Again returns to the hospital due to recurrence of abdominal pain, multiple episodes of nausea, emesis, despite draining the PEG tube collecting bag 3-4 times yesterday similar to while he experience when he was admitted on the first of the month.   PMT was consulted for pain management.  Subjective:   I saw and examined Mr. [name] today.  He was sitting in bed in no distress.  He continues to feel the current regimen of fentanyl patch, octreotide, and steroids has been very beneficial.  He continues to regularly need IV rescue medication.  We discussed plan to initiate oral medication and see how he does overnight and tomorrow with hopes that we can be working toward discharge home on Monday.  I have previously discussed with liaison from infusion pharmacy.  He is on special mix of TPN that would not be readily available over the weekend.  Working toward hopeful discharge early next week (potentially Monday).  Objective:   Vital Signs:  BP 121/76 (BP Location: Right Arm)    Pulse (!) 58    Temp 97.7 F (36.5 C) (Oral)    Resp 16    Ht 5\' 11"  (1.803 m)    Wt 106.8 kg    SpO2 100%    BMI 32.84 kg/m   Physical Exam: Physical Exam Vitals and nursing note reviewed.  Constitutional:      General: He is not in acute distress.    Appearance: He  is ill-appearing.  HENT:     Head: Normocephalic and atraumatic.  Cardiovascular:     Rate and Rhythm: Normal rate.  Pulmonary:     Effort: No respiratory distress.     Breath sounds: No wheezing or rhonchi.  Skin:    General: Skin is warm and dry.  Neurological:     General: No focal deficit present.     Mental Status: He is alert.  Psychiatric:        Mood and Affect: Mood normal.        Behavior: Behavior normal.  +abdominal pain.   Palliative Assessment/Data: 40-50%   Assessment & Plan:   Impression: Present on Admission:  Intractable abdominal pain  GERD (gastroesophageal reflux disease)  Colon cancer metastasized to multiple sites (HCC)  Nausea and vomiting  Hypokalemia  Obesity (BMI 30-39.9)  SBO (small bowel obstruction) (HCC)  Hyperlipidemia  AKI (acute kidney injury) ([place])  56 year old male with abdominal pain secondary to small bowel obstruction and peritoneal metastasis from metastatic colon cancer and carcinomatosis.  Presented with nausea and vomiting which seems to be better controlled.  He is expressed he is not ready for hospice, oncology on board with 1 more cycle of chemotherapy and plan to continue TPN.  Adjustments made to PCA for improved pain management, pain appears to be better managed today.  Overall long-term prognosis poor.  SUMMARY OF RECOMMENDATIONS   Pain: Cancer-related.  Continues to  report pain is fairly well controlled.  Continue fentanyl patch at 250 mcg/h.  Currently receiving Dilaudid 1 mg IV as needed for breakthrough pain.  We discussed plan to initiate Dilaudid 4 mg p.o. to see if this is effective breakthrough pain medication for him.  I also left a backup dose of 1 mg of IV Dilaudid to be given 45 to 60 minutes after his oral rescue medication of the oral rescue medication is insufficient to relieve his pain.  Overall, he has responded very well to octreotide and steroids.  Octreotide can be difficult to do at home, however, he is  already on TPN and infusion liaison has confirmed this could be added to his current TPN.   PMT will continue to follow  Symptom Management:  Trial of first-line medication of Dilaudid 4 mg p.o. every 3 hours as needed for breakthrough pain.  Dilaudid 1 mg every 3 hours as needed as second line medication if oral medication is ineffective Continue Fentanyl patch to 250 mcg/hr (72 hour patch) Continue octreotide 25 mcg/h Continue dexamethasone 4 mg twice daily.  Could consider titrating down steroids soon to lowest dose that provides symptom relief.  Code Status: Limited code  Prognosis: Unable to determine  Discharge Planning: To Be Determined  Discussed with: Patient, medical team, nursing team  Thank you for allowing [place] to participate in the care of [name] PMT will continue to support holistically.  Micheline Rough, MD [place] Team [phone]

## 2021-10-12 NOTE — Progress Notes (Signed)
[facility] PROGRESS NOTE  Patient: MECCA BARGA DXI:[id number]   PCP: [name], MD DOB: [birth year]   DOA: 09/30/2021   DOS: 10/12/2021    Subjective: Required 2 doses of IV narcotics in last 24 hours.  No nausea or vomiting.  No fever no chills.  Breathing okay.  Objective:  Vitals:   10/12/21 0602 10/12/21 1436  BP: 121/76 120/85  Pulse: (!) 58 64  Resp: 16 20  Temp: 97.7 F (36.5 C) 98.4 F (36.9 C)  SpO2: 100% 97%    S1-S2 present. Bowel sound present. Clear to auscultation.  Assessment and plan: Sinus bradycardia. Will discontinue Lopressor.  Discussed with palliative care regarding transitioning from IV Ativan to either home dose of Xanax or p.o. Ativan.  Monitor recommendation.  Transition IV Protonix to p.o. Protonix although not a good idea to use enteric-coated tablet.  Author: Berle Mull, MD [facility] 10/12/2021 4:56 PM   If 7PM-7AM, please contact night-coverage at [email or web address]

## 2021-10-12 NOTE — TOC Progression Note (Signed)
Transition of Care [facility]) - Progression Note    Patient Details  Name: Ricardo [name] MRN: [id number] Date of Birth: 1966/06/20  Transition of [facility]) CM/SW Contact  Ross Ludwig, [place] Phone Number: 10/12/2021, 4:59 PM  Clinical Narrative:     TOC continuing to follow patient's progress throughout discharge planning.          Expected Discharge Plan and Services                                                 Social Determinants of Health (SDOH) Interventions    Readmission Risk Interventions Readmission Risk Prevention Plan 09/16/2021 09/06/2021 08/28/2021  Transportation Screening - - Complete  HRI or [place] - Complete Complete  Social Work Consult for [place] Planning/Counseling - Complete Complete  Palliative Care Screening - Not Applicable Not Applicable  HRI or Home Care Consult Complete - -  SW Recovery Care/Counseling Consult Complete - -  Palliative Care Screening Complete - -  [place] Not Applicable - -  Some recent data might be hidden

## 2021-10-13 LAB — COMPREHENSIVE METABOLIC PANEL
ALT: 36 U/L (ref 0–44)
AST: 20 U/L (ref 15–41)
Albumin: 2.4 g/dL — ABNORMAL LOW (ref 3.5–5.0)
Alkaline Phosphatase: 126 U/L (ref 38–126)
Anion gap: 6 (ref 5–15)
BUN: 23 mg/dL — ABNORMAL HIGH (ref 6–20)
CO2: 23 mmol/L (ref 22–32)
Calcium: 7.9 mg/dL — ABNORMAL LOW (ref 8.9–10.3)
Chloride: 105 mmol/L (ref 98–111)
Creatinine, Ser: 0.75 mg/dL (ref 0.61–1.24)
GFR, Estimated: >60 mL/min (ref >60)
Glucose, Bld: 117 mg/dL — ABNORMAL HIGH (ref 70–99)
Potassium: 3.9 mmol/L (ref 3.5–5.1)
Sodium: 134 mmol/L — ABNORMAL LOW (ref 135–145)
Total Bilirubin: 0.7 mg/dL (ref 0.3–1.2)
Total Protein: 6 g/dL — ABNORMAL LOW (ref 6.5–8.1)

## 2021-10-13 LAB — GLUCOSE, CAPILLARY
Glucose-Capillary: 132 mg/dL — ABNORMAL HIGH (ref 70–99)
Glucose-Capillary: 89 mg/dL (ref 70–99)
Glucose-Capillary: 92 mg/dL (ref 70–99)
Glucose-Capillary: 168 mg/dL — ABNORMAL HIGH (ref 70–99)

## 2021-10-13 LAB — TRIGLYCERIDES: Triglycerides: 86 mg/dL (ref <150)

## 2021-10-13 LAB — MAGNESIUM: Magnesium: 1.9 mg/dL (ref 1.7–2.4)

## 2021-10-13 LAB — PHOSPHORUS: Phosphorus: 2.7 mg/dL (ref 2.5–4.6)

## 2021-10-13 MED ORDER — INSULIN ASPART 100 UNIT/ML IJ SOLN
0.0000 [IU] | INTRAMUSCULAR | Status: DC
  Administered 2021-10-13: 3 [IU] via SUBCUTANEOUS
  Administered 2021-10-14: 2 [IU] via SUBCUTANEOUS

## 2021-10-13 MED ORDER — TRAVASOL 10 % IV SOLN
INTRAVENOUS | Status: AC
  Filled 2021-10-13: qty 1299.5

## 2021-10-13 NOTE — Assessment & Plan Note (Deleted)
Corrected.  Monitor.

## 2021-10-13 NOTE — Progress Notes (Signed)
Daily Progress Note   Patient Name: Ricardo [name]       Date: 10/13/2021 DOB: August 30, 1966  Age: 56 y.o. MRN#: [id number] Attending Physician: Lavina Hamman, MD Primary Care Physician: [name], MD Admit Date: 09/30/2021 Length of Stay: 12 days  Reason for Consultation/Follow-up: Pain control  HPI/Patient Profile:  56 y.o. male  with past medical history of osteoarthritis, class II obesity, generalized anxiety disorder, hyperlipidemia, unspecified sleep disorder, GERD, colon cancer with metastasis and peritoneal carcinomatosis who was recently admitted and discharged yesterday due to SBO requiring PEG placement for venting purposes    Again returns to the hospital due to recurrence of abdominal pain, multiple episodes of nausea, emesis, despite draining the PEG tube collecting bag 3-4 times yesterday similar to while he experience when he was admitted on the first of the month.   PMT was consulted for pain management.  Subjective:  I saw and examined Ricardo [name] today.  He was sitting in bed in no distress.  He reports that the increase in oral rescue medication has been beneficial.  He is concerned still that he has not gone a full day without needing IV pain medication.  Discussed his desire to ensure that he can adequately control symptoms today without IV meds and discharge tomorrow.  I have previously discussed with liaison from infusion pharmacy.  He is on special mix of TPN that would not be readily available over the weekend.  Working toward hopeful discharge at beginning of week.  Objective:   Vital Signs:  BP 126/80 (BP Location: Right Arm)    Pulse 74    Temp 98 F (36.7 C) (Oral)    Resp 18    Ht 5\' 11"  (1.803 m)    Wt 106.8 kg    SpO2 98%    BMI 32.84 kg/m   Physical Exam: Physical Exam Vitals and nursing note reviewed.  Constitutional:      General: He is not in acute distress.    Appearance: He is ill-appearing.  HENT:     Head: Normocephalic and  atraumatic.  Cardiovascular:     Rate and Rhythm: Normal rate.  Pulmonary:     Effort: No respiratory distress.     Breath sounds: No wheezing or rhonchi.  Skin:    General: Skin is warm and dry.  Neurological:     General: No focal deficit present.     Mental Status: He is alert.  Psychiatric:        Mood and Affect: Mood normal.        Behavior: Behavior normal.  +abdominal pain.   Palliative Assessment/Data: 40-50%   Assessment & Plan:   Impression: Present on Admission:  Intractable abdominal pain  GERD (gastroesophageal reflux disease)  Colon cancer metastasized to multiple sites (HCC)  Nausea and vomiting  Hypokalemia  Obesity (BMI 30-39.9)  SBO (small bowel obstruction) (HCC)  Hyperlipidemia  AKI (acute kidney injury) ([place])  56 year old male with abdominal pain secondary to small bowel obstruction and peritoneal metastasis from metastatic colon cancer and carcinomatosis.  Presented with nausea and vomiting which seems to be better controlled.  He is expressed he is not ready for hospice, oncology on board with 1 more cycle of chemotherapy and plan to continue TPN.  Adjustments made to PCA for improved pain management, pain appears to be better managed today.  Overall long-term prognosis poor.  SUMMARY OF RECOMMENDATIONS   Pain: Cancer-related.  Continue fentanyl patch at 250 mcg/h.  Continue with dilaudid  8mg  orally as needed for breakthrough pain.  I also left a backup dose of 1 mg of IV Dilaudid to be given 45 to 60 minutes after his oral rescue medication of the oral rescue medication is insufficient to relieve his pain but he is going to do his best to avoid this in anticipation of discharge tomorrow.  Overall, he has responded very well to octreotide and steroids.  Octreotide can be difficult to do at home, however, he is already on TPN and infusion liaison has confirmed this could be added to his current TPN.   PMT will continue to follow  Symptom Management:   Continue Dilaudid 8 mg p.o. every 3 hours as needed for breakthrough pain.  Dilaudid 1 mg every 3 hours as needed as second line medication if oral medication is ineffective Continue Fentanyl patch to 250 mcg/hr (72 hour patch) Continue octreotide 25 mcg/h Continue dexamethasone 4 mg twice daily.  Could consider titrating down steroids soon to lowest dose that provides symptom relief.  Code Status: Limited code  Prognosis: Unable to determine  Discharge Planning: To Be Determined  Discussed with: Patient, medical team, nursing team  Thank you for allowing [place] to participate in the care of TAVARIS EUDY PMT will continue to support holistically.  Micheline Rough, MD [place] Team [phone]

## 2021-10-13 NOTE — Progress Notes (Addendum)
PHARMACY - TOTAL PARENTERAL NUTRITION CONSULT NOTE   Indication: Small bowel obstruction  Patient Measurements: Height: _0  (180.3 cm) Weight: 106.8 kg (235 lb 7.2 oz) IBW/kg (Calculated) : 75.3 TPN AdjBW (KG): 85.7 Body mass index is 32.84 kg/m. Usual Weight: Weight was stable 06/19/21-09/14/21 and is now -32 lb since 09/14/21  Assessment:  19 yoM with PMH metastatic colon cancer with peritoneal carcinomatosis with recent admissions for SBO and on chronic TPN, readmitted 1/17 for same. Venting PEG recently placed, but continues to have abdominal pain despite draining collection bag 3-4x daily. PICC line placed 1/19 d/t difficulty accessing implanted chemo port. Pharmacy consulted to manage TPN inpatient.  Significant Events: Did not receive TPN bag on 10/08/21 due to issues with TPN tubing, D10W hung at 95 mL/hr 1/28: Discussed patients refusal of insulin administration and concerns for hyperglycemia on cyclic TPN and IV steroids with Dr. Posey Pronto and Dr. Domingo Cocking. Pt now agreeable for insulin administration and compliant overnight   Glucose / Insulin: No hx DM. Hgb A1c 5.5% on 09/14/21 - CBGs range 89 - 160 (goal <180); mSSI 4x/day, 6 units required in 24 hours -Pt was previously refusing insulin administration when order parameters met, now agreeable to take it if needed - Dexamethasone 50m IV q12h ordered Electrolytes: Na remains slightly low but stable on max Na in TPN; other lytes remain stable in the low-normal range Renal:  SCr, bicarb stable WNL; BUN elevated but trending down; UOP low but not fully charted Hepatic: Tbili, AST/ALT remain WNL; Alk Phos now decreased to WNL; albumin remains low but stable; TG stable WNL 1/30 Intake / Output; MIVF: Strict I/O not charted - G-tube output: 4900 ml (also likely includes most, if not all, uncharted PO intake) - Per previous discussion with MD 1/24 - no MIVF orders during hours when cyclic TPN off. GI Imaging: - 1/17: persistent SBO GI  Surgeries / Procedures:  - Venting PEG placed 12/29  Central access: double lumen PICC in L brachial vein placed 1/19 in IR -1/23: Exchange of LUE non-functional DL PICC by IR TPN start date: 1/19  Home TPN formula received from [facility] 2300 mL over 24hr with one hour taper up and down provides protein 125g/day, 2225 kcal/day.  Nutritional Goals: Goal TPN provides 130 g protein, 2367 kcal  RD Assessment: 10/03/2021 Estimated Needs Total Energy Estimated Needs: 2365-2600 kcal Total Protein Estimated Needs: 120-135 grams Total Fluid Estimated Needs: >/= 2.5 L/day  Current Nutrition:  Full liquids TPN at goal rate  Plan:  At 1800: Advance TPN cycle to infuse over 12 hours Electrolytes in TPN:  Increase Mg, Phos Na 150 mEq/L (max concentration) K 20 mEq/L Ca 4 mEq/L Mg 5 mEq/L Phos 14 mmol/L Cl:Ac 2:1 Add standard MVI and trace elements to TPN Continue mSSI and CBG checks at 4 times/day (2 checks while TPN infusing, 2 off of TPN infusion). Monitor for compliance.  Per previous discussion with MD 1/24 - no MIVF orders during hours when cyclic TPN off. Monitor TPN labs on Mon/Thurs.   Emillia Weatherly A, PharmD 10/13/21 7:28 AM

## 2021-10-13 NOTE — Hospital Course (Signed)
56 y.o. WM PMHx osteoarthritis, class II obesity, generalized anxiety disorder, hyperlipidemia, unspecified sleep disorder, GERD, colon cancer with metastasis and peritoneal carcinomatosis who was recently admitted and discharged yesterday due to SBO requiring PEG placement for venting purposes    Again returns to the hospital due to recurrence of abdominal pain, multiple episodes of nausea, emesis, despite draining the PEG tube

## 2021-10-13 NOTE — Plan of Care (Signed)

## 2021-10-13 NOTE — Progress Notes (Signed)
[facility] PROGRESS NOTE  Patient: Ricardo [name] BOM:[id number]   PCP: [name], MD DOB: 01-Jul-1966   DOA: 09/30/2021   DOS: 10/13/2021    Subjective: No nausea or vomiting no fever no chills.  Pain well controlled.  Objective:  Vitals:   10/13/21 0854 10/13/21 1348  BP: 112/76 118/84  Pulse: 67 67  Resp: 16 18  Temp: 97.9 F (36.6 C) 98.1 F (36.7 C)  SpO2: 99% 96%    Acid also present. Clear to auscultation. Bowel sound present.  Assessment and plan: Cancer-related pain. Continue current pain regimen.  Disposition. Patient will likely go home tomorrow.  Author: Berle Mull, MD [facility] 10/13/2021 8:47 PM   If 7PM-7AM, please contact night-coverage at [email or web address]

## 2021-10-13 NOTE — TOC Progression Note (Signed)
Transition of Care [facility]) - Progression Note   Patient Details  Name: Ricardo [name] MRN: [id number] Date of Birth: 09-27-1965  Transition of Care [facility]) CM/SW [place], LCSW Phone Number: 10/13/2021, 12:56 PM  Clinical Narrative: Patient expected to discharge home tomorrow with TPN through [facility]. CSW updated Pam with [employer].    Readmission Risk Interventions Readmission Risk Prevention Plan 09/16/2021 09/06/2021 08/28/2021  Transportation Screening - - Complete  HRI or [place] - Complete Complete  Social Work Consult for [place] Planning/Counseling - Complete Complete  Palliative Care Screening - Not Applicable Not Applicable  HRI or Home Care Consult Complete - -  SW Recovery Care/Counseling Consult Complete - -  Palliative Care Screening Complete - -  [place] Not Applicable - -  Some recent data might be hidden

## 2021-10-13 NOTE — Progress Notes (Signed)
Oncology Discharge Planning Note  [facility] at [facility] Address: [place], [place], [place] [address] Hours of Operation:  Nena Polio, Monday - Friday  Clinic Contact Information:  [phone]) [phone]  Oncology Care Team: Medical Oncologist:  Dr. [name]  Patient Details: Name:  Ricardo [name], Ricardo [name] MRN:   [id number] DOB:   02-24-1966 Reason for Current Admission: Intractable abdominal pain  Discharge Planning Narrative: Discharge follow-up appointments for oncology are current and available on the AVS and MyChart.   Upon discharge from the hospital, hematology/oncology's post discharge plan of care for the outpatient setting is: 10/17/21 OV with Dr Burr Medico with labs.   Avel Ogawa will be called within two business days after discharge to review hematology/oncology's plan of care for full understanding.    Outpatient Oncology Specific Care Only: Oncology appointment transportation needs addressed?:  not applicable Oncology medication management for symptom management addressed?:  not applicable Chemo Alert Card reviewed?:  not applicable Immunotherapy Alert Card reviewed?:  not applicable

## 2021-10-13 NOTE — Plan of Care (Signed)

## 2021-10-14 LAB — GLUCOSE, CAPILLARY
Glucose-Capillary: 135 mg/dL — ABNORMAL HIGH (ref 70–99)
Glucose-Capillary: 106 mg/dL — ABNORMAL HIGH (ref 70–99)

## 2021-10-14 MED ORDER — FENTANYL 100 MCG/HR TD PT72: 2.0000 | MEDICATED_PATCH | TRANSDERMAL | 0 refills | Status: DC

## 2021-10-14 MED ORDER — HEPARIN SOD (PORK) LOCK FLUSH 100 UNIT/ML IV SOLN
250.0000 [IU] | INTRAVENOUS | Status: AC | PRN
  Administered 2021-10-14: 250 [IU]
  Filled 2021-10-14: qty 2.5

## 2021-10-14 MED ORDER — HEPARIN SOD (PORK) LOCK FLUSH 100 UNIT/ML IV SOLN
500.0000 [IU] | INTRAVENOUS | Status: AC | PRN
  Administered 2021-10-14: 500 [IU]
  Filled 2021-10-14: qty 5

## 2021-10-14 MED ORDER — LORAZEPAM 0.5 MG PO TABS: 0.5000 mg | ORAL_TABLET | Freq: Four times a day (QID) | ORAL | 0 refills | Status: DC | PRN

## 2021-10-14 MED ORDER — TRAVASOL 10 % IV SOLN: INTRAVENOUS | Status: DC

## 2021-10-14 MED ORDER — DULOXETINE HCL 30 MG PO CPEP: 30.0000 mg | ORAL_CAPSULE | Freq: Every day | ORAL | 0 refills | Status: DC

## 2021-10-14 MED ORDER — BOOST BREEZE PO LIQD: 1.0000 | Freq: Three times a day (TID) | ORAL | 0 refills | Status: DC

## 2021-10-14 MED ORDER — PANTOPRAZOLE SODIUM 40 MG PO PACK: 40.0000 mg | PACK | Freq: Every day | ORAL | 0 refills | Status: DC

## 2021-10-14 MED ORDER — HYDROMORPHONE HCL 2 MG PO TABS
ORAL_TABLET | ORAL | 0 refills | Status: DC
Start: 2021-10-14 — End: 2021-10-17

## 2021-10-14 MED ORDER — DEXAMETHASONE 4 MG PO TABS: 4.0000 mg | ORAL_TABLET | Freq: Two times a day (BID) | ORAL | 0 refills | Status: DC

## 2021-10-14 MED ORDER — FENTANYL 50 MCG/HR TD PT72
1.0000 | MEDICATED_PATCH | TRANSDERMAL | 0 refills | Status: DC
Start: 2021-10-14 — End: 2021-10-30

## 2021-10-14 NOTE — Plan of Care (Signed)

## 2021-10-14 NOTE — Progress Notes (Signed)
PICC capped for home, chest port deaccessed by IV team.  Provided discharge education to Pt, all questions and concerns addressed. Pt not in acute distress, discharged home with belongings accompanied by son.

## 2021-10-14 NOTE — Progress Notes (Signed)
Daily Progress Note   Patient Name: Ricardo [name]       Date: 10/14/2021 DOB: 02/11/1966  Age: 56 y.o. MRN#: [id number] Attending Physician: Lavina Hamman, MD Primary Care Physician: [name], MD Admit Date: 09/30/2021 Length of Stay: 13 days  Reason for Consultation/Follow-up: Pain control  HPI/Patient Profile:  56 y.o. male  with past medical history of osteoarthritis, class II obesity, generalized anxiety disorder, hyperlipidemia, unspecified sleep disorder, GERD, colon cancer with metastasis and peritoneal carcinomatosis who was recently admitted and discharged yesterday due to SBO requiring PEG placement for venting purposes    Again returns to the hospital due to recurrence of abdominal pain, multiple episodes of nausea, emesis, despite draining the PEG tube collecting bag 3-4 times yesterday similar to while he experience when he was admitted on the first of the month.   PMT was consulted for pain management.  Subjective:  Patient examined at bedside today. His son also present. Mr. Grandstaff is resting comfortably in bed. Express pain is well controlled. We reviewed current regimen with his appreciation of plans to discharge home later today. He reports he would like to try to go to his job @ [employer] over the next 2 days. Discussed importance of taking things slow and easy, not to overdue activity, and listening to his body.   He is aware of follow-up appointment with myself and Dr. Burr Medico on Friday.   He has medications (fentanyl and dilaudid) available in the home. Ativan will be sent to his pharmacy.Home infusion is being delivered to his home this morning and his wife is there to receive.   Objective:   Vital Signs:  BP (!) 138/94 (BP Location: Right Arm)    Pulse 67    Temp 98 F (36.7 C) (Oral)    Resp 16    Ht 5\' 11"  (1.803 m)    Wt 106.8 kg    SpO2 98%    BMI 32.84 kg/m   Physical Exam: AAO x4, weak appearing Normal breathing pattern    Palliative Assessment/Data: 40-50%   Assessment & Plan:   Impression: Present on Admission:  Intractable abdominal pain  GERD (gastroesophageal reflux disease)  Colon cancer metastasized to multiple sites (HCC)  Nausea and vomiting  Hypokalemia  Obesity (BMI 30-39.9)  SBO (small bowel obstruction) (HCC)  Hyperlipidemia  AKI (acute kidney injury) ([place])  56 year old male with abdominal pain secondary to small bowel obstruction and peritoneal metastasis from metastatic colon cancer and carcinomatosis.  Presented with nausea and vomiting which seems to be better controlled.  He is expressed he is not ready for hospice, oncology on board with 1 more cycle of chemotherapy and plan to continue TPN.  Adjustments made to PCA for improved pain management, pain appears to be better managed today.  Overall long-term prognosis poor.  SUMMARY OF RECOMMENDATIONS   Pain: Cancer-related.  Continue fentanyl patch at 250 mcg/h.   Oral dilaudid 8mg  as needed for breakthrough pain Ativan 0.5mg  as needed for anxiety. Prescription has been sent to his local pharmacy to be picked-up after discharge. Patient has enough home supply of fentanyl an dilaudid until follow-up appointment on Friday with Dr. Burr Medico and myself.  Plans to discharge home later today and continue with octreotide added to his TPN infusion in the home as arranged by colleague.    Symptom Management:  Continue Dilaudid 8 mg p.o. every 3 hours as needed for breakthrough pain.  Dilaudid 1 mg every 3 hours as needed as second  line medication if oral medication is ineffective Continue Fentanyl patch to 250 mcg/hr (72 hour patch) Continue octreotide 25 mcg/h Continue dexamethasone 4 mg twice daily.  Could consider titrating down steroids soon to lowest dose that provides symptom relief.  Code Status: Limited code  Prognosis: POOR   Discharge Planning:  HOme with home health and ongoing outpatient palliative support.   Discussed with:  Patient, medical team, nursing team  Thank you for allowing [place] to participate in the care of ZAYDIN BILLEY PMT will continue to support holistically.  Time Total: 35 min.   Visit consisted of counseling and education dealing with the complex and emotionally intense issues of symptom management and palliative care in the setting of serious and potentially life-threatening illness.Greater than 50%  of this time was spent counseling and coordinating care related to the above assessment and plan.  Alda Lea, AGPCNP-BC  Palliative Medicine Team [phone]

## 2021-10-15 NOTE — Discharge Summary (Signed)
Physician Discharge Summary   Patient: Ricardo [name] MRN: [id number] DOB: [birth year]  Admit date:     09/30/2021  Discharge date: 10/14/2021  Discharge Physician: Berle Mull   PCP: [name], MD   Recommendations at discharge:    Please follow up with Palliative care   Discharge Diagnoses: Principal Problem:   Intractable abdominal pain Active Problems:   Colon cancer metastasized to multiple sites [facility])   SBO (small bowel obstruction) (HCC)   Nausea and vomiting   GERD (gastroesophageal reflux disease)   AKI (acute kidney injury) ([place])   Hypokalemia   Obesity (BMI 30-39.9)   Hyperlipidemia   Peripherally inserted central catheter (PICC) in place   Palliative care by specialist  Resolved Problems:   * No resolved hospital problems. [facility] Course: 56 y.o. WM PMHx osteoarthritis, class II obesity, generalized anxiety disorder, hyperlipidemia, unspecified sleep disorder, GERD, colon cancer with metastasis and peritoneal carcinomatosis who was recently admitted and discharged yesterday due to SBO requiring PEG placement for venting purposes    Again returns to the hospital due to recurrence of abdominal pain, multiple episodes of nausea, emesis, despite draining the PEG tube   Assessment and Plan: Colon cancer metastasized to multiple sites [name]- (present on admission) Metastatic colon cancer with intractable cancer-related pain. Patient has overall poor prognosis. Oncology following up with the patient. Palliative care also consulted. Patient has a venting G-tube for his small bowel obstruction. Not a candidate for further chemotherapy. Ideal candidate for residential hospice placement given poor symptom control as well as poor prognosis. Currently on TPN. Palliative care adding octreotide and increasing Decadron for pain control. Was on PCA pump as well as fentanyl patch. Now will be on new dose of patch and dilaudid PO. He will follow up with  Palliative care on Friday and should have medication to last till that visit based on his calculation  GERD (gastroesophageal reflux disease)- (present on admission) Continue PPI.  SBO (small bowel obstruction) ([name] With history of her venting G-tube. Currently symptoms well controlled.  AKI (acute kidney injury) ([name] Hypokalemia  Baseline serum creatinine 0.7.  On presentation serum creatinine 1.12.  Treated with IV fluid and now serum creatinine back to normal 0.9. Potassium Corrected.  Monitor.  Obesity (BMI 30-39.9)- (present on admission) Body mass index is 31.76 kg/m.  Placing the patient at high risk of poor outcome  Hyperlipidemia- (present on admission) LDL 184.  Currently not on any medication.  Palliative care by specialist Appreciate palliative care assistance for pain management as well as goals of care conversation.     Pain control - [name] was reviewed. and patient was instructed, not to drive, operate heavy machinery, perform activities at heights, swimming or participation in water activities or provide baby-sitting services while on Pain, Sleep and Anxiety Medications; until their outpatient Physician has advised to do so again. Also recommended to not to take more than prescribed Pain, Sleep and Anxiety Medications.   Consultants: Palliative care  Procedures performed:  PICC line placement Exchange of LUE non-functional DL PICC. Disposition: Home health Diet recommendation:  Discharge Diet Orders (From admission, onward)     Start     Ordered   10/14/21 0000  Diet - low sodium heart healthy        10/14/21 1237           Full liquid diet  DISCHARGE MEDICATION: Allergies as of 10/14/2021  Reactions   Oxaliplatin Hives, Other (See Comments)   15 minutes into infusion, patient started to complain of feeling warm and states " it feels like  the last time I had my reaction"        Medication List     STOP taking these medications    ALPRAZolam 0.25 MG tablet Commonly known as: XANAX   pantoprazole 40 MG tablet Commonly known as: Protonix Replaced by: pantoprazole sodium 40 mg       TAKE these medications    dexamethasone 4 MG tablet Commonly known as: DECADRON Take 1 tablet (4 mg total) by mouth 2 (two) times daily. What changed: when to take this Notes to patient: 01/31 bedtime   DULoxetine 30 MG capsule Commonly known as: CYMBALTA Take 1 capsule (30 mg total) by mouth daily.   feeding supplement (BOOST BREEZE) Liqd Take 1 each by mouth 3 (three) times daily before meals.   fentaNYL 100 MCG/HR Commonly known as: [name] 2 patches onto the skin every 3 (three) days. Along with 50 MCG/HR patch for a total of 250 MCG/HR dose. What changed:  how much to take additional instructions Another medication with the same name was removed. Continue taking this medication, and follow the directions you see here.   fentaNYL 50 MCG/HR Commonly known as: [name] 1 patch onto the skin every 3 (three) days. Along with 2 of 100 MCG/HR patch for a total of 250 MCG/HR dose. What changed:  additional instructions Another medication with the same name was removed. Continue taking this medication, and follow the directions you see here.   HYDROmorphone 2 MG tablet Commonly known as: Dilaudid Take 4 tablets (71m) every 3-4 hours as needed for breakthrough pain. What changed: additional instructions Notes to patient: Last dose given 01/31 12:29pm   LORazepam 0.5 MG tablet Commonly known as: ATIVAN Take 1 tablet (0.5 mg total) by mouth every 6 (six) hours as needed for anxiety. Notes to patient: Last dose given 01/31 06:45am   naloxone 4 MG/0.1ML Liqd nasal spray kit Commonly known as: NARCAN Use 1 spray for opioid overdose What changed:  how much to take when to take this reasons to take  this additional instructions   pantoprazole sodium 40 mg Commonly known as: PROTONIX Take 40 mg by mouth daily. Replaces: pantoprazole 40 MG tablet   polyethylene glycol 17 g packet Commonly known as: MIRALAX / GLYCOLAX Take 17 g by mouth daily as needed for mild constipation. Notes to patient: Resume home regimen   prochlorperazine 10 MG tablet Commonly known as: COMPAZINE Take 1 tablet (10 mg total) by mouth every 6 (six) hours as needed for nausea or vomiting. Notes to patient: Resume home regimen        Follow-up Information     Sagardia, MInes Bloomer MD. Schedule an appointment as soon as possible for a visit in 1 week(s).   Specialty: Internal Medicine Contact information: [phone]                Discharge Exam: [place]   10/09/21 0620 10/11/21 0432 10/12/21 0611  Weight: 99 kg 107 kg 106.8 kg   General: Appear in mild distress, no Rash; Oral Mucosa Clear, moist. no Abnormal Neck Mass Or lumps, Conjunctiva normal  Cardiovascular: S1 and S2 Present, no Murmur, Respiratory: good respiratory effort, Bilateral Air entry present and CTA, no Crackles, no wheezes Abdomen: Bowel Sound present, Soft and no tenderness Extremities: no Pedal edema  Neurology: alert and oriented to time, place, and person affect appropriate. no new focal deficit Gait not checked due to patient safety concerns   Condition at discharge: good  The results of significant diagnostics from this hospitalization (including imaging, microbiology, ancillary and laboratory) are listed below for reference.   Imaging Studies: DG CHEST PORT 1 VIEW  Result Date: 09/18/2021 CLINICAL DATA:  Left PICC placement EXAM: PORTABLE CHEST 1 VIEW COMPARISON:  09/14/2021 FINDINGS: Single frontal view of the chest demonstrates stable right chest wall port. Left-sided PICC tip overlies the superior vena cava. Cardiac silhouette is stable. Stable left basilar  consolidation. Small effusion is suspected. Right chest is clear. No pneumothorax. IMPRESSION: 1. Left-sided PICC tip overlying superior vena cava. 2. Continued left basilar consolidation and likely small effusion. Electronically Signed   By: Randa Ngo M.D.   On: 09/18/2021 17:32   DG ABD ACUTE 2+V W 1V CHEST  Result Date: 09/30/2021 CLINICAL DATA:  Abdominal pain. EXAM: DG ABDOMEN ACUTE WITH 1 VIEW CHEST COMPARISON:  Abdominal radiographs 09/14/2021 CT abdomen and pelvis 09/02/2021 FINDINGS: Large body habitus. Right chest wall porta catheter with tip overlying the superior vena cava/right atrial junction. Cardiac silhouette and mediastinal contours are within normal limits. Mild calcification within aortic arch. The lungs are clear. No pleural effusion or pneumothorax. A gastrostomy tube again overlies the left upper quadrant. There are air-fluid levels within multiple loops of bowel on upright view. There may be mild distention of small-bowel loops measuring up to approximately 4 cm in caliber No portal venous gas or pneumatosis is seen. No subdiaphragmatic free air on upright view. IMPRESSION: There are air-fluid levels throughout the small bowel. There is likely dilatation of some small bowel loops at least 4 cm. Note is made of small bowel obstruction with similar small bowel dilatation due to adhesions to the anterior abdominal wall seen on prior CT. Gastrostomy tube is again noted. Electronically Signed   By: Yvonne Kendall M.D.   On: 09/30/2021 09:29   ECHOCARDIOGRAM COMPLETE  Result Date: 10/01/2021    ECHOCARDIOGRAM REPORT   Patient Name:   MADDAX PALINKAS Ionescu Date of Exam: 10/01/2021 Medical Rec #:  [id number]       Height:       71.0 in Accession #:    [id number]      Weight:       257.9 lb Date of Birth:  05-07-1966        BSA:          2.349 m Patient Age:    38 years        BP:           132/79 mmHg Patient Gender: M               HR:           112 bpm. Exam Location:  Inpatient Procedure: 2D  Echo, Cardiac Doppler, Color Doppler and Intracardiac            Opacification Agent Indications:    Abnormal ECG R94.31  History:        Patient has no prior history of Echocardiogram examinations.                 Risk Factors:Dyslipidemia. GERD.  Sonographer:    Darlina Sicilian RDCS Referring Phys: [id number] DAVID MANUEL [place]  1. Left ventricular ejection fraction, by estimation, is 65 to 70%. The left ventricle has normal function. The left ventricle has no regional wall motion  abnormalities. Left ventricular diastolic parameters are consistent with Grade I diastolic dysfunction (impaired relaxation).  2. Right ventricular systolic function is normal. The right ventricular size is normal.  3. The mitral valve is normal in structure. No evidence of mitral valve regurgitation. No evidence of mitral stenosis.  4. The aortic valve is normal in structure. Aortic valve regurgitation is not visualized. No aortic stenosis is present.  5. The inferior vena cava is normal in size with greater than 50% respiratory variability, suggesting right atrial pressure of 3 mmHg. FINDINGS  Left Ventricle: Left ventricular ejection fraction, by estimation, is 65 to 70%. The left ventricle has normal function. The left ventricle has no regional wall motion abnormalities. Definity contrast agent was given IV to delineate the left ventricular  endocardial borders. The left ventricular internal cavity size was normal in size. There is no left ventricular hypertrophy. Left ventricular diastolic parameters are consistent with Grade I diastolic dysfunction (impaired relaxation). Normal left ventricular filling pressure. Right Ventricle: The right ventricular size is normal. No increase in right ventricular wall thickness. Right ventricular systolic function is normal. Left Atrium: Left atrial size was normal in size. Right Atrium: Right atrial size was normal in size. Pericardium: There is no evidence of pericardial effusion.  Mitral Valve: The mitral valve is normal in structure. No evidence of mitral valve regurgitation. No evidence of mitral valve stenosis. Tricuspid Valve: The tricuspid valve is normal in structure. Tricuspid valve regurgitation is not demonstrated. No evidence of tricuspid stenosis. Aortic Valve: The aortic valve is normal in structure. Aortic valve regurgitation is not visualized. No aortic stenosis is present. Pulmonic Valve: The pulmonic valve was normal in structure. Pulmonic valve regurgitation is not visualized. No evidence of pulmonic stenosis. Aorta: The aortic root is normal in size and structure. Venous: The inferior vena cava is normal in size with greater than 50% respiratory variability, suggesting right atrial pressure of 3 mmHg. IAS/Shunts: No atrial level shunt detected by color flow Doppler.  LEFT VENTRICLE PLAX 2D LVIDd:         5.00 cm   Diastology LVIDs:         2.80 cm   LV e' medial:    6.53 cm/s LV PW:         0.90 cm   LV E/e' medial:  7.0 LV IVS:        1.00 cm   LV e' lateral:   6.74 cm/s LVOT diam:     2.80 cm   LV E/e' lateral: 6.8 LV SV:         45 LV SV Index:   19 LVOT Area:     6.16 cm  RIGHT VENTRICLE RV S prime:     9.14 cm/s TAPSE (M-mode): 1.5 cm LEFT ATRIUM         Index LA diam:    2.50 cm 1.06 cm/m  AORTIC VALVE LVOT Vmax:   49.00 cm/s LVOT Vmean:  30.900 cm/s LVOT VTI:    0.073 m  AORTA Ao Root diam: 3.80 cm Ao Asc diam:  3.50 cm MITRAL VALVE MV Area (PHT): 3.58 cm    SHUNTS MV Decel Time: 212 msec    Systemic VTI:  0.07 m MV E velocity: 45.80 cm/s  Systemic Diam: 2.80 cm MV A velocity: 60.00 cm/s MV E/A ratio:  0.76 Skeet Latch MD Electronically signed by Skeet Latch MD Signature Date/Time: 10/01/2021/12:05:12 PM    Final    IR PICC REPLACEMENT LEFT INC IMG GUIDE  Result  Date: 10/06/2021 INDICATION: 56 year old male with nonfunctional PICC EXAM: IMAGE GUIDED EXCHANGE OF PERIPHERAL INSERTED CENTRAL CATHETER MEDICATIONS: None ANESTHESIA/SEDATION: None FLUOROSCOPY  TIME:  Fluoroscopy Time: 0 minutes 48 seconds (10 mGy). COMPLICATIONS: None PROCEDURE: The patient was advised of the possible risks and complications and agreed to undergo the procedure. The patient was then brought to the angiographic suite for the procedure. Image was acquired. This demonstrates that the previously placed PICC head been withdrawn to the SVC, directed into the lateral wall. The left arm and indwelling central catheter was prepped with chlorhexidine, draped in the usual sterile fashion using maximum barrier technique (cap and mask, sterile gown, sterile gloves, large sterile sheet, hand hygiene and cutaneous antisepsis). The indwelling catheter was cut at the hub, and the fragment was withdrawn, and then cut again at a shorter length. A Nitrex 80 cm 0.018 wire was introduced through the catheter into the vein. Catheter fragment was removed on the wire. Over the wire, a 5 [place] dual lumen power injectable PICC measuring 46 cm was advanced to the upper right atrium, 2 vertebral body below the carina. Fluoroscopy during the procedure and fluoro spot radiograph confirms appropriate catheter position. The catheter was flushed and covered with asterile dressing. Patient tolerated the procedure well and remained hemodynamically stable throughout. No complications were encountered and no significant blood loss. IMPRESSION: Image guided exchange of left upper extremity nonfunctional PICC for a new 46 cm dual lumen PICC. Signed, Dulcy Fanny. Dellia Nims, RPVI [facility] Radiology Electronically Signed   By: Corrie Mckusick D.O.   On: 10/06/2021 09:39   [place] EKG SITE RITE  Result Date: 09/30/2021 If Site Rite image not attached, placement could not be confirmed due to current cardiac rhythm.  [place] EKG Site Rite  Result Date: 09/18/2021 If Site Rite image not attached, placement could not be confirmed due to current cardiac rhythm.  [place] EKG SITE RITE  Result  Date: 09/18/2021 If Site Rite image not attached, placement could not be confirmed due to current cardiac rhythm.   Microbiology: Results for orders placed or performed during the hospital encounter of 09/14/21  Resp Panel by RT-PCR (Flu A&B, Covid) Nasopharyngeal Swab     Status: None   Collection Time: 09/14/21  7:46 AM   Specimen: Nasopharyngeal Swab; Nasopharyngeal(NP) swabs in vial transport medium  Result Value Ref Range Status   SARS Coronavirus 2 by RT PCR NEGATIVE NEGATIVE Final    Comment: (NOTE) SARS-CoV-2 target nucleic acids are NOT DETECTED.  The SARS-CoV-2 RNA is generally detectable in upper respiratory specimens during the acute phase of infection. The lowest concentration of SARS-CoV-2 viral copies this assay can detect is 138 copies/mL. A negative result does not preclude SARS-Cov-2 infection and should not be used as the sole basis for treatment or other patient management decisions. A negative result may occur with  improper specimen collection/handling, submission of specimen other than nasopharyngeal swab, presence of viral mutation(s) within the areas targeted by this assay, and inadequate number of viral copies(<138 copies/mL). A negative result must be combined with clinical observations, patient history, and epidemiological information. The expected result is Negative.  Fact Sheet for Patients:  EntrepreneurPulse.com.au  Fact Sheet for Healthcare Providers:  IncredibleEmployment.be  This test is no t yet approved or cleared by the [place] FDA and  has been authorized for detection and/or diagnosis of SARS-CoV-2 by FDA under an Emergency Use Authorization (EUA). This EUA will remain  in effect (meaning this test can  be used) for the duration of the COVID-19 declaration under Section 564(b)(1) of the Act, 21 U.S.C.section 360bbb-3(b)(1), unless the authorization is terminated  or revoked sooner.       Influenza  A by PCR NEGATIVE NEGATIVE Final   Influenza B by PCR NEGATIVE NEGATIVE Final    Comment: (NOTE) The Xpert Xpress SARS-CoV-2/FLU/RSV plus assay is intended as an aid in the diagnosis of influenza from Nasopharyngeal swab specimens and should not be used as a sole basis for treatment. Nasal washings and aspirates are unacceptable for Xpert Xpress SARS-CoV-2/FLU/RSV testing.  Fact Sheet for Patients: EntrepreneurPulse.com.au  Fact Sheet for Healthcare Providers: IncredibleEmployment.be  This test is not yet approved or cleared by the [place] FDA and has been authorized for detection and/or diagnosis of SARS-CoV-2 by FDA under an Emergency Use Authorization (EUA). This EUA will remain in effect (meaning this test can be used) for the duration of the COVID-19 declaration under Section 564(b)(1) of the Act, 21 U.S.C. section 360bbb-3(b)(1), unless the authorization is terminated or revoked.  Performed at [facility], [place] [address] [place],  [address]     Labs: CBC: No results for input(s): WBC, NEUTROABS, HGB, HCT, MCV, PLT in the last 168 hours. Basic Metabolic Panel: Recent Labs  Lab 10/09/21 0700 10/10/21 0447 10/11/21 0432 10/12/21 0354 10/13/21 0339  NA 131* 134* 134* 132* 134*  K 4.2 4.6 4.0 3.6 3.9  CL 101 106 107 104 105  CO2 23 22 21* 24 23  GLUCOSE 131* 169* 305* 173* 117*  BUN 30* 29* 26* 24* 23*  CREATININE 0.94 0.92 0.82 0.74 0.75  CALCIUM 8.6* 8.1* 7.9* 7.8* 7.9*  MG 2.2 2.0 2.0  --  1.9  PHOS 3.8 2.7 2.8  --  2.7   Liver Function Tests: Recent Labs  Lab 10/09/21 0700 10/10/21 0447 10/13/21 0339  AST '28 31 20  ' ALT 44 57* 36  ALKPHOS 155* 140* 126  BILITOT 1.0 0.7 0.7  PROT 7.5 6.7 6.0*  ALBUMIN 2.7* 2.5* 2.4*   CBG: Recent Labs  Lab 10/13/21 1017 10/13/21 1612 10/13/21 2052 10/14/21 0510 10/14/21 0658  GLUCAP 89 92 168* 135* 106*    Discharge time spent: greater  than 30 minutes.  Signed: Berle Mull, MD [facility]

## 2021-10-16 ENCOUNTER — Other Ambulatory Visit: Payer: Self-pay | Admitting: Hematology

## 2021-10-16 ENCOUNTER — Other Ambulatory Visit: Payer: Self-pay

## 2021-10-16 DIAGNOSIS — Z789 Other specified health status: Secondary | ICD-10-CM

## 2021-10-16 MED ORDER — PANTOPRAZOLE SODIUM 40 MG PO PACK: 40.0000 mg | PACK | Freq: Every day | ORAL | 1 refills | Status: DC

## 2021-10-17 ENCOUNTER — Encounter: Payer: Self-pay | Admitting: Nurse Practitioner

## 2021-10-17 ENCOUNTER — Encounter: Payer: Self-pay | Admitting: Hematology

## 2021-10-17 ENCOUNTER — Inpatient Hospital Stay: Payer: BC Managed Care – PPO

## 2021-10-17 ENCOUNTER — Inpatient Hospital Stay: Payer: BC Managed Care – PPO | Attending: Nurse Practitioner | Admitting: Hematology

## 2021-10-17 ENCOUNTER — Inpatient Hospital Stay (HOSPITAL_BASED_OUTPATIENT_CLINIC_OR_DEPARTMENT_OTHER): Payer: BC Managed Care – PPO | Admitting: Nurse Practitioner

## 2021-10-17 ENCOUNTER — Telehealth: Payer: Self-pay | Admitting: Hematology

## 2021-10-17 ENCOUNTER — Other Ambulatory Visit: Payer: Self-pay

## 2021-10-17 VITALS — BP 109/78 | HR 87 | Temp 98.4°F | Resp 17 | Ht 71.0 in | Wt 241.0 lb

## 2021-10-17 DIAGNOSIS — Z5111 Encounter for antineoplastic chemotherapy: Secondary | ICD-10-CM | POA: Diagnosis present

## 2021-10-17 DIAGNOSIS — C772 Secondary and unspecified malignant neoplasm of intra-abdominal lymph nodes: Secondary | ICD-10-CM

## 2021-10-17 DIAGNOSIS — Z79899 Other long term (current) drug therapy: Secondary | ICD-10-CM | POA: Insufficient documentation

## 2021-10-17 DIAGNOSIS — F419 Anxiety disorder, unspecified: Secondary | ICD-10-CM

## 2021-10-17 DIAGNOSIS — C182 Malignant neoplasm of ascending colon: Secondary | ICD-10-CM | POA: Diagnosis not present

## 2021-10-17 DIAGNOSIS — I82B12 Acute embolism and thrombosis of left subclavian vein: Secondary | ICD-10-CM | POA: Insufficient documentation

## 2021-10-17 DIAGNOSIS — I82409 Acute embolism and thrombosis of unspecified deep veins of unspecified lower extremity: Secondary | ICD-10-CM | POA: Insufficient documentation

## 2021-10-17 DIAGNOSIS — K5900 Constipation, unspecified: Secondary | ICD-10-CM | POA: Diagnosis not present

## 2021-10-17 DIAGNOSIS — G893 Neoplasm related pain (acute) (chronic): Secondary | ICD-10-CM | POA: Diagnosis not present

## 2021-10-17 DIAGNOSIS — Z515 Encounter for palliative care: Secondary | ICD-10-CM

## 2021-10-17 DIAGNOSIS — C189 Malignant neoplasm of colon, unspecified: Secondary | ICD-10-CM

## 2021-10-17 DIAGNOSIS — Z789 Other specified health status: Secondary | ICD-10-CM

## 2021-10-17 DIAGNOSIS — Z7901 Long term (current) use of anticoagulants: Secondary | ICD-10-CM | POA: Insufficient documentation

## 2021-10-17 DIAGNOSIS — Z95828 Presence of other vascular implants and grafts: Secondary | ICD-10-CM

## 2021-10-17 DIAGNOSIS — C786 Secondary malignant neoplasm of retroperitoneum and peritoneum: Secondary | ICD-10-CM | POA: Insufficient documentation

## 2021-10-17 DIAGNOSIS — R110 Nausea: Secondary | ICD-10-CM

## 2021-10-17 LAB — CMP (CANCER CENTER ONLY)
ALT: 24 U/L (ref 0–44)
AST: 17 U/L (ref 15–41)
Albumin: 3.2 g/dL — ABNORMAL LOW (ref 3.5–5.0)
Alkaline Phosphatase: 134 U/L — ABNORMAL HIGH (ref 38–126)
Anion gap: 5 (ref 5–15)
BUN: 25 mg/dL — ABNORMAL HIGH (ref 6–20)
CO2: 33 mmol/L — ABNORMAL HIGH (ref 22–32)
Calcium: 8.6 mg/dL — ABNORMAL LOW (ref 8.9–10.3)
Chloride: 99 mmol/L (ref 98–111)
Creatinine: 0.85 mg/dL (ref 0.61–1.24)
GFR, Estimated: >60 mL/min (ref >60)
Glucose, Bld: 115 mg/dL — ABNORMAL HIGH (ref 70–99)
Potassium: 4 mmol/L (ref 3.5–5.1)
Sodium: 137 mmol/L (ref 135–145)
Total Bilirubin: 0.8 mg/dL (ref 0.3–1.2)
Total Protein: 7.1 g/dL (ref 6.5–8.1)

## 2021-10-17 LAB — CBC WITH DIFFERENTIAL (CANCER CENTER ONLY)
Abs Immature Granulocytes: 0.08 10*3/uL — ABNORMAL HIGH (ref 0.00–0.07)
Basophils Absolute: 0 10*3/uL (ref 0.0–0.1)
Basophils Relative: 0 %
Eosinophils Absolute: 0 10*3/uL (ref 0.0–0.5)
Eosinophils Relative: 0 %
HCT: 40 % (ref 39.0–52.0)
Hemoglobin: 13.1 g/dL (ref 13.0–17.0)
Immature Granulocytes: 1 %
Lymphocytes Relative: 5 %
Lymphs Abs: 0.7 10*3/uL (ref 0.7–4.0)
MCH: 28.3 pg (ref 26.0–34.0)
MCHC: 32.8 g/dL (ref 30.0–36.0)
MCV: 86.4 fL (ref 80.0–100.0)
Monocytes Absolute: 0.7 10*3/uL (ref 0.1–1.0)
Monocytes Relative: 5 %
Neutro Abs: 12.4 10*3/uL — ABNORMAL HIGH (ref 1.7–7.7)
Neutrophils Relative %: 89 %
Platelet Count: 269 10*3/uL (ref 150–400)
RBC: 4.63 MIL/uL (ref 4.22–5.81)
RDW: 17.6 % — ABNORMAL HIGH (ref 11.5–15.5)
WBC Count: 14 10*3/uL — ABNORMAL HIGH (ref 4.0–10.5)
nRBC: 0 % (ref 0.0–0.2)

## 2021-10-17 LAB — IRON AND IRON BINDING CAPACITY (CC-WL,HP ONLY)
Iron: 24 ug/dL — ABNORMAL LOW (ref 45–182)
Saturation Ratios: 10 % — ABNORMAL LOW (ref 17.9–39.5)
TIBC: 232 ug/dL — ABNORMAL LOW (ref 250–450)
UIBC: 208 ug/dL (ref 117–376)

## 2021-10-17 LAB — VITAMIN B12: Vitamin B-12: 1689 pg/mL — ABNORMAL HIGH (ref 180–914)

## 2021-10-17 LAB — FOLATE: Folate: 20.8 ng/mL (ref >5.9)

## 2021-10-17 LAB — PHOSPHORUS: Phosphorus: 3.6 mg/dL (ref 2.5–4.6)

## 2021-10-17 LAB — FERRITIN: Ferritin: 902 ng/mL — ABNORMAL HIGH (ref 24–336)

## 2021-10-17 LAB — PREALBUMIN: Prealbumin: 20.9 mg/dL (ref 18–38)

## 2021-10-17 LAB — MAGNESIUM: Magnesium: 2 mg/dL (ref 1.7–2.4)

## 2021-10-17 LAB — TRIGLYCERIDES: Triglycerides: 67 mg/dL (ref <150)

## 2021-10-17 MED ORDER — SODIUM CHLORIDE 0.9% FLUSH
10.0000 mL | Freq: Once | INTRAVENOUS | Status: AC
  Administered 2021-10-17: 10 mL

## 2021-10-17 MED ORDER — HEPARIN SOD (PORK) LOCK FLUSH 100 UNIT/ML IV SOLN
500.0000 [IU] | Freq: Once | INTRAVENOUS | Status: AC
  Administered 2021-10-17: 500 [IU]

## 2021-10-17 MED ORDER — HYDROMORPHONE HCL 4 MG PO TABS: 8.0000 mg | ORAL_TABLET | ORAL | 0 refills | Status: DC | PRN

## 2021-10-17 NOTE — Progress Notes (Signed)
[place]  [phone] [phone]   Name: Ricardo [name] Date: 10/17/2021 MRN: [id number]  DOB: Jul 30, 1966  Patient Care Team: [name], MD as PCP - General (Internal Medicine) [name], MD as Consulting Physician (Oncology) [name], RN (Inactive) as Oncology Nurse Navigator Pickenpack-Cousar, [name], NP as Nurse Practitioner (Nurse Practitioner)    INTERVAL HISTORY: Ricardo [name] is a 56 y.o. 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 Patient recently hospitalized 12/14 and 12/20 for SBO which was improved by conservative measures. CT of abdomen pelvis showed small bowel obstruction with transition point likely in the mid abdomen. Venting G-tube was placed prior to discharge. Ricardo [name] was discharged on 09/13/2021 and required readmission on 09/14/2021 via EMS from home with similar concerns of abdominal pain, nausea, and vomiting. Imaging showes improvement in gaseous distention, however continues to have significant G-tube output, abdominal pain, nausea, and vomiting.   SOCIAL HISTORY:     reports that he has never smoked. He has never used smokeless tobacco. He reports that he does not currently use alcohol. He reports that he does not use drugs.    PAST MEDICAL HISTORY: Past Medical History:  Diagnosis Date   Arthritis    Class 2 obesity 09/14/2021   Colon cancer ([place])    with metastasis   Family history of adverse reaction to anesthesia    mother had problem with it due to her asthma   Family history of breast cancer    Family history of prostate cancer    GAD (generalized anxiety disorder) 09/03/2021   GERD (gastroesophageal reflux disease) 09/03/2021   History of COVID-19 04/30/2021   Formatting of this note might be different from the original. 01/2021, asymptomatic   Hyperlipidemia 11/27/2020   Pleural effusion, left 09/06/2021    Sleep disorder 11/10/2018    ALLERGIES:  is allergic to oxaliplatin.  MEDICATIONS:  Current Outpatient Medications  Medication Sig Dispense Refill   dexamethasone (DECADRON) 4 MG tablet Take 1 tablet (4 mg total) by mouth 2 (two) times daily. 60 tablet 0   DULoxetine (CYMBALTA) 30 MG capsule Take 1 capsule (30 mg total) by mouth daily. 60 capsule 0   fentaNYL (DURAGESIC) 100 MCG/HR Place 2 patches onto the skin every 3 (three) days. Along with 50 MCG/HR patch for a total of 250 MCG/HR dose. 20 patch 0   fentaNYL (DURAGESIC) 50 MCG/HR Place 1 patch onto the skin every 3 (three) days. Along with 2 of 100 MCG/HR patch for a total of 250 MCG/HR dose. 10 patch 0   HYDROmorphone (DILAUDID) 4 MG tablet Take 2 tablets (8 mg total) by mouth every 3 (three) hours as needed for severe pain or moderate pain. Take 2 tablets (8mg ) every 3-4 hours as needed for breakthrough pain. 180 tablet 0   LORazepam (ATIVAN) 0.5 MG tablet Take 1 tablet (0.5 mg total) by mouth every 6 (six) hours as needed for anxiety. 90 tablet 0   naloxone (NARCAN) nasal spray 4 mg/0.1 mL Use 1 spray for opioid overdose (Patient taking differently: 0.4 mg once as needed (opioid overdose).) 2 each 1   Nutritional Supplements (FEEDING SUPPLEMENT, BOOST BREEZE,) LIQD Take 1 each by mouth 3 (three) times daily before meals. 10000 mL 0   pantoprazole sodium (PROTONIX) 40 mg Place 40 mg into feeding tube daily. 30 packet 1   polyethylene glycol (MIRALAX / GLYCOLAX) 17 g packet Take 17  g by mouth daily as needed for mild constipation.     prochlorperazine (COMPAZINE) 10 MG tablet Take 1 tablet (10 mg total) by mouth every 6 (six) hours as needed for nausea or vomiting. 30 tablet 2   Current Facility-Administered Medications  Medication Dose Route Frequency Provider Last Rate Last Admin   0.9 %  sodium chloride infusion   Intravenous PRN Causey, Charlestine Massed, NP        VITAL SIGNS: BP 109/78 (BP Location: Right Arm, Patient Position:  Sitting)    Pulse 87    Temp 98.4 F (36.9 C) (Oral)    Resp 17    Ht 5\' 11"  (1.803 m)    Wt 241 lb (109.3 kg)    SpO2 98%    BMI 33.61 kg/m  Filed Weights   10/17/21 1156  Weight: 241 lb (109.3 kg)    Estimated body mass index is 33.61 kg/m as calculated from the following:   Height as of this encounter: 5\' 11"  (1.803 m).   Weight as of this encounter: 241 lb (109.3 kg).   PERFORMANCE STATUS (ECOG) : 2 - Symptomatic, <50% confined to bed   Physical Exam General: NAD Lungs: Normal breathing pattern Cardio: RRR GI: venting PEG in place and connected to bag, brown colored content in bag. He is leaking around the site. Area cleansed and redressed.  Neurological: AAOx3  IMPRESSION:  Ricardo [name] presents to the clinic today from recent hospital follow-up. He was admitted for uncontrolled pain and discharged on 10/14/21. Myself and Palliative colleagues were involved for ongoing symptom management support. Since discharge he has been doing well. Reports he feels much better. Has several episodes of nausea and vomiting which is controlled with medication. He expresses hopes he [name] return to the classroom and see his children at some point. He attempted to go this week but was unable.   Abdominal pain related to neoplasm Feels pain is better controlled. States he must follow regimen around the clock and stay ahead of his pain and discomfort.  Current regimen: 200 mcg of fentanyl patch which was increased during recent admission from 166mcg.  Patient is aware the goal is to hopefully gain a better sustainable pain control without treating symptoms consistently with breakthrough medications. Dilaudid 8 mg every 3-4 hours as needed for breakthrough pain Cymbalta 30 mg daily               2.    Nausea/vomiting He has some experienced some nausea and vomiting since discharge. States he had an episode this past week for about 30 min of constant vomiting. He shares he knows he is getting into the  trouble when his G-tube output increases and becomes consistently thick and dark.  Compazine 10 mg as needed for nausea Pantoprazole 40 mg daily.   Decadron 4mg  twice daily  Octreotide has been added to his daily TPN in the home     3. Anxiety Ativan 0.5mg  every 6 hours as needed for nausea and anxiety.   4.  Constipation Patient reports he is having daily bowel movements.  Sometimes 2-3 times per day which is an improvement.   PLAN: No adjustments to current regimen. He was recently discharged from the hospital and adjustments were recently made (see above). He will review medication quantities in the home with his wife and notify our team of count and needed refills.  Ongoing support and close evaluation of effective symptom management.  Goal is to keep him out of the hospital  if possible.  I will plan to see patient back in the clinic 2-3 weeks with future oncology appointments.   Patient expressed understanding and was in agreement with this plan. He also understands that He [name] call the clinic at any time with any questions, concerns, or complaints.   Time Total: 45 min.   Visit consisted of counseling and education dealing with the complex and emotionally intense issues of symptom management and palliative care in the setting of serious and potentially life-threatening illness.Greater than 50%  of this time was spent counseling and coordinating care   Signed by: Alda Lea, AGPCNP-BC [place]

## 2021-10-17 NOTE — Telephone Encounter (Signed)
Left message with follow-up appointment per 2/3 los.

## 2021-10-17 NOTE — Progress Notes (Signed)
[place]   [phone] Fax:([phone]) [phone]   Clinic Follow up Note   Patient Care Team: [name], MD as PCP - General (Internal Medicine) [name], MD as Consulting Physician (Oncology) [name], RN (Inactive) as Oncology Nurse Navigator Pickenpack-Cousar, [name], NP as Nurse Practitioner (Nurse Practitioner)  Date of Service:  10/17/2021  CHIEF COMPLAINT: f/u of metastatic colon cancer  CURRENT THERAPY:  Supportive Care  ASSESSMENT & PLAN:  Ricardo [name] is a 56 y.o. male with   1. Symptom Management: Constipation, abdominal pain, small bowl obstruction  -he has been admitted several times in the last two months due to recurrent abdominal pain, episodes of nausea and vomiting, and small bowel obstruction. He required PEG placement for venting purposes. -He is on TPN, will continue  -he reports today he has not had a bowel movement for 16 days, as expected with SBO and little oral intake. -f/u with clinic for pain management.  He is on high dose fentanyl patch and dilaudid   2. 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 Work up showed mass in the terminal ileum with extensive omental nodularity and LN involvement. Baseline tumor marker CA 19-9 and CEA were normal. 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 This confirms stage IV metastatic cancer from cecum -He began first-line palliative chemo with FOLFOX on 12/18/20 -FO shows KRAS/NRAS wildtype, Bevacizumab was added with cycle 2 -Treatment was escalated  to [facility] with cycle 6 FOLFOX on 03/13/21,  He tolerated well -He underwent laparoscopy on 05/13/21 by Dr. Clovis Riley, unfortunately due to his extensive peritoneal metastasis he was not a candidate for cytoreductive surgery/HIPEC. -restaging CT 07/01/21 showed stable disease  -given recent SBO, I restarted chemotherapy FOLFOXFIRI On September 18, 2021. He tolerated well but was readmitted for abdominal pain  -To my surprise, he actually is doing reasonably well after recent hospital discharge, performed status improved.  He strongly wanted to try chemo again.  Plan to try cycle 2 next week, if he tolerates, will repeat every 3 weeks to allow him recover better    3. Genetic testing -He has brother with prostate cancer and sister with breast cancer in their 60s. He is eligible for genetic testing.  -Negative result   4. Social Support -He is an avid cyclist. Last long distant riding was 06/2020 across [place]. He has not biked in the 8-9 weeks before cancer diagnosis due to symptoms. His weight heavily fluctuates with changes in his activity level, diet and biking. -He is married with 1 adult son. His brother Ricardo [name] is in town. He works as a Designer, television/film set at [employer]. He may take time off work based on how to tolerate chemotherapy. -He notes he has good family support, very close with his wife and 39 year old son who currently lives in [place]    5.  Goals of care discussion, limited code  -Due to his peritoneal metastasis, he is not a candidate for cytoreductive surgery with HIPEC -He understands his cancer is metastatic, not curable, but still treatable -In the end, he does not want to suffer.  He strongly  desires to have a clear head to say goodbye to his wife and son when the time comes.   -He does not want prolonged life support    PLAN: -plan to restart chemo mid next week with FOLFOXFIRI with same dose as 1/5, with GCSF on day 3  -f/u next week before chemo and one week after chemo     No problem-specific Assessment & Plan notes found for this encounter.   SUMMARY OF ONCOLOGIC HISTORY: Oncology History Overview Note  Cancer Staging metastatic cecal cancer Staging form: Colon and Rectum, AJCC 8th Edition - Clinical stage from 11/29/2020: Stage IVC (cTX, cN2, pM1c) - Signed by [name], MD on 12/04/2020 Stage prefix: Initial diagnosis Histologic grade (G): G3 Histologic grading system: 4 grade system    metastatic cecal cancer  11/27/2020 Imaging   CT Angio CAP  IMPRESSION: 1. Thickening of the terminal ileum with associated proximal mid to distal small bowel obstruction. Findings could be due to an ileitis versus malignancy. Nonspecific mesenteric edema could be due to engorgement versus metastases. No associated bowel perforation. Recommend endoscopy for further evaluation. 2. Indeterminate right lower quadrant lymphadenopathy. 3. Scattered colonic diverticulosis with no acute diverticulitis. 4. Stable right hepatic lobe subcentimeter hyperdensity likely represents a hepatic hemangioma. 5. No acute vascular abnormality. Aortic Atherosclerosis (ICD10-I70.0) - mild. 6. No acute intrathoracic abnormality.   11/28/2020 Imaging   CT AP  IMPRESSION: 1. There is masslike, circumferential thickening of the terminal ileum and cecal base near the ileocecal valve and abnormally enlarged lymph nodes in the right lower quadrant mesentery adjacent to the terminal ileum measuring up to 2.3 x 1.4 cm. 2. There is extensive omental and peritoneal nodularity and caking throughout the abdomen. 3. Findings are highly concerning for primary colon malignancy with nodal and peritoneal metastatic disease. 4. Small volume perihepatic and perisplenic ascites. 5. The small bowel is generally decompressed, with full some fluid-filled, nondistended loops throughout. There is transit of oral enteric contrast to the terminal ileum. No evidence of overt bowel obstruction at this  time. Esophagogastric tube is position with tip and side port below the diaphragm. 6. Atelectasis or consolidation of the dependent bilateral lung bases, new compared to prior examination.   Aortic Atherosclerosis (ICD10-I70.0).     11/29/2020 Surgery   EXPLORATORY LAPAROTOMY, PARTIAL BOWEL RESECTION, POSSIBLE OSTOMY CREATION, PERITIONEAL BIOPSY, INSERTION OF GASTROSTOMY TUBE by Dr [name]   11/29/2020 Initial Biopsy   FINAL MICROSCOPIC DIAGNOSIS:   AB. 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    COMMENT:   Immunohistochemistry (IHC) for CK20 and CDX-2 is strong and diffusely  positive.  CK7, TTF-1, Synaptophysin, Chromogranin and CD56 are  negative.  The immunophenotype is compatible with origin from the lower  gastrointestinal tract.  IHC for MMR will be reported separately.  Case  preliminarily discussed with Dr. [name] on 12/02/2020.   At the request of Dr. [name], [phone] was reviewed in retrospect.  Review of the submitted sections confirms the presence of acute  appendicitis.  No malignancy is identified.    11/29/2020 Cancer Staging   Staging form: Colon and Rectum, AJCC 8th Edition - Clinical stage from 11/29/2020: Stage IVC (cTX, cN2, pM1c) - Signed by [name], MD on 12/04/2020 Stage prefix: Initial diagnosis Histologic grade (G): G3 Histologic grading system: 4 grade system    12/04/2020 Initial Diagnosis   Cancer of ascending colon metastatic to intra-abdominal lymph node [facility])    Chemotherapy  FOLFOX q2weeks    12/18/2020 - 02/15/2021 Chemotherapy      Patient is on Antibody Plan: COLORECTAL BEVACIZUMAB Q14D     01/03/2021 Imaging   CT A/P IMPRESSION: 1. Wall thickening of the terminal ileum again seen. Fluid-filled distal small bowel, ascending, and transverse colon without obstruction. 2. Equivocal improvement in omental and peritoneal metastatic disease with  slightly improved small volume perihepatic and perisplenic ascites. Right lower quadrant mesenteric adenopathy is similar or mildly improved. 3. Sigmoid colonic diverticulosis. Mild mural wall thickening in the sigmoid, no acute diverticulitis. 4. Gastrostomy tube in place, balloon normally positioned in the stomach. 5. Chronic bilateral L5 pars interarticularis defects with trace anterolisthesis of L5 on S1. Chronic avascular necrosis of the left femoral head.   01/16/2021 Genetic Testing   Negative genetic testing. CTNNA1 [id number] VUS, RAD51D p.G140E VUS and TSC1 p.R768H VUS found on the CancerNext-Expanded+RNAinsight.  The CancerNext-Expanded gene panel offered by [facility] and includes sequencing and rearrangement analysis for the following 77 genes: AIP, ALK, APC*, ATM*, AXIN2, BAP1, BARD1, BLM, BMPR1A, BRCA1*, BRCA2*, BRIP1*, CDC73, CDH1*, CDK4, CDKN1B, CDKN2A, CHEK2*, CTNNA1, DICER1, FANCC, FH, FLCN, GALNT12, KIF1B, LZTR1, MAX, MEN1, MET, MLH1*, MSH2*, MSH3, MSH6*, MUTYH*, NBN, NF1*, NF2, NTHL1, PALB2*, PHOX2B, PMS2*, POT1, PRKAR1A, PTCH1, PTEN*, RAD51C*, RAD51D*, RB1, RECQL, RET, SDHA, SDHAF2, SDHB, SDHC, SDHD, SMAD4, SMARCA4, SMARCB1, SMARCE1, STK11, SUFU, TMEM127, TP53*, TSC1, TSC2, VHL and XRCC2 (sequencing and deletion/duplication); EGFR, EGLN1, HOXB13, KIT, MITF, PDGFRA, POLD1, and POLE (sequencing only); EPCAM and GREM1 (deletion/duplication only). DNA and RNA analyses performed for * genes. The report date is Jan 16, 2021.   02/24/2021 Imaging   CT AP  IMPRESSION: 1. Interval resolution of previously seen small volume ascites throughout the abdomen and pelvis. There is redemonstrated, ill-defined stranding and thickening of the peritoneal surfaces and omentum, which is somewhat diminished compared to prior examination. 2. Interval improvement in right lower quadrant mesocolon lymph nodes. 3. Findings are consistent with treatment response of nodal and peritoneal  metastatic disease. 4. Unchanged, matted appearing thickening of the terminal ileum and cecal base. 5. Sigmoid diverticulosis with wall thickening of the mid to distal sigmoid, similar to prior examination. Findings are most suggestive of chronic sequelae of diverticulitis.   Aortic Atherosclerosis (ICD10-I70.0).   03/13/2021 -  Chemotherapy   Patient is on Treatment Plan : COLORECTAL FOLFOXIRI + Bevacizumab q14d     05/13/2021 Procedure   OPERATIVE PROCEDURE: Laparoscopy and peritoneal biopsy.  SURGEON: Merlyn Albert. Clovis Riley, MD   05/13/2021 Pathology Results   Final Pathologic Diagnosis    A. PERITONEAL, BIOPSY :              Metastatic adenocarcinoma.               See comment.    Comment    The biopsies show predominately fibrosis  and fibroadipose tissue with a small focus of moderately differentiated adenocarcinoma. Given the patient's history, the findings favor metastasis of the patient's known colonic cancer.     07/01/2021 Imaging   CT CAP  IMPRESSION: Chest Impression:   1. No evidence of thoracic metastasis.   Abdomen / Pelvis Impression:   1. Thickening through the terminal ileum similar to prior. No ileocecal lymphadenopathy. 2. One focus linear thickening the peritoneum adjacent to loops of small bowel in the ventral abdomen which extend to the proximal sigmoid colon are more prominent than prior and concerning for residual peritoneal carcinoma. 3. No evidence of solid organ metastasis in the abdomen pelvis.  INTERVAL HISTORY:  Ricardo [name] is here for a follow up of metastatic colon cancer. He was last seen by me on 10/10/21 while he was in the hospital. He presents to the clinic alone.   All other systems were reviewed with the patient and are negative.  MEDICAL HISTORY:  Past Medical History:  Diagnosis Date   Arthritis    Class 2 obesity 09/14/2021   Colon cancer ([place])    with metastasis   Family history of adverse reaction to anesthesia     mother had problem with it due to her asthma   Family history of breast cancer    Family history of prostate cancer    GAD (generalized anxiety disorder) 09/03/2021   GERD (gastroesophageal reflux disease) 09/03/2021   History of COVID-19 04/30/2021   Formatting of this note might be different from the original. 01/2021, asymptomatic   Hyperlipidemia 11/27/2020   Pleural effusion, left 09/06/2021   Sleep disorder 11/10/2018    SURGICAL HISTORY: Past Surgical History:  Procedure Laterality Date   BOWEL RESECTION N/A 11/29/2020   Procedure: PARTIAL BOWEL RESECTION;  Surgeon: Jovita Kussmaul, MD;  Location: [place];  Service: General;  Laterality: N/A;   GASTROSTOMY Left 11/29/2020   Procedure: INSERTION OF GASTROSTOMY TUBE;  Surgeon: Jovita Kussmaul, MD;  Location: [place];  Service: General;  Laterality: Left;   IR GASTROSTOMY TUBE MOD SED  09/11/2021   IR IMAGING GUIDED PORT INSERTION  12/12/2020   LAPAROSCOPIC APPENDECTOMY N/A 01/17/2019   Procedure: APPENDECTOMY LAPAROSCOPIC;  Surgeon: Ralene Ok, MD;  Location: [place];  Service: General;  Laterality: N/A;   LAPAROTOMY N/A 11/29/2020   Procedure: EXPLORATORY LAPAROTOMY;  Surgeon: Jovita Kussmaul, MD;  Location: [place];  Service: General;  Laterality: N/A;  PUT CASE IN ROOM 1 STARTING AT 9:30AM FOR 120 MIN   OSTOMY N/A 11/29/2020   Procedure: POSSIBLE OSTOMY CREATION;  Surgeon: Jovita Kussmaul, MD;  Location: [place];  Service: General;  Laterality: N/A;   RECTAL BIOPSY N/A 11/29/2020   Procedure: PERITIONEAL BIOPSY;  Surgeon: Jovita Kussmaul, MD;  Location: [place];  Service: General;  Laterality: N/A;   SHOULDER SURGERY Left 09/19/2015    I have reviewed the social history and family history with the patient and they are unchanged from previous note.  ALLERGIES:  is allergic to oxaliplatin.  MEDICATIONS:  Current Outpatient Medications  Medication Sig Dispense Refill   dexamethasone (DECADRON) 4 MG tablet Take 1 tablet (4 mg total) by mouth 2  (two) times daily. 60 tablet 0   DULoxetine (CYMBALTA) 30 MG capsule Take 1 capsule (30 mg total) by mouth daily. 60 capsule 0   fentaNYL (DURAGESIC) 100 MCG/HR Place 2 patches onto the skin every 3 (three) days. Along with 50 MCG/HR patch for a total of 250 MCG/HR dose. 20 patch 0   fentaNYL (DURAGESIC) 50 MCG/HR Place 1 patch onto the skin every 3 (three) days. Along with 2 of 100 MCG/HR patch for a total of 250 MCG/HR dose. 10 patch 0   HYDROmorphone (DILAUDID) 4 MG tablet Take 2 tablets (8 mg total) by mouth every 3 (three) hours as needed for severe pain or moderate pain. Take 2 tablets (59m) every 3-4 hours as needed for breakthrough pain. 180 tablet 0   LORazepam (ATIVAN) 0.5 MG tablet Take 1 tablet (0.5 mg total) by mouth every 6 (six) hours as needed for anxiety. 90 tablet 0   naloxone (NARCAN) nasal spray 4 mg/0.1 mL Use  1 spray for opioid overdose (Patient taking differently: 0.4 mg once as needed (opioid overdose).) 2 each 1   Nutritional Supplements (FEEDING SUPPLEMENT, BOOST BREEZE,) LIQD Take 1 each by mouth 3 (three) times daily before meals. 10000 mL 0   pantoprazole sodium (PROTONIX) 40 mg Place 40 mg into feeding tube daily. 30 packet 1   polyethylene glycol (MIRALAX / GLYCOLAX) 17 g packet Take 17 g by mouth daily as needed for mild constipation.     prochlorperazine (COMPAZINE) 10 MG tablet Take 1 tablet (10 mg total) by mouth every 6 (six) hours as needed for nausea or vomiting. 30 tablet 2   Current Facility-Administered Medications  Medication Dose Route Frequency Provider Last Rate Last Admin   0.9 %  sodium chloride infusion   Intravenous PRN Causey, Charlestine Massed, NP        PHYSICAL EXAMINATION: ECOG PERFORMANCE STATUS: 2 - Symptomatic, <50% confined to bed  There were no vitals filed for this visit. Wt Readings from Last 3 Encounters:  10/17/21 241 lb (109.3 kg)  10/12/21 235 lb 7.2 oz (106.8 kg)  09/14/21 259 lb 11.2 oz (117.8 kg)     GENERAL:alert, no  distress and comfortable SKIN: skin color normal, no rashes or significant lesions EYES: normal, Conjunctiva are pink and non-injected, sclera clear  NEURO: alert & oriented x 3 with fluent speech  LABORATORY DATA:  I have reviewed the data as listed CBC Latest Ref Rng & Units 10/17/2021 10/07/2021 10/06/2021  WBC 4.0 - 10.5 K/uL 14.0(H) 12.2(H) 13.5(H)  Hemoglobin 13.0 - 17.0 g/dL 13.1 14.2 14.5  Hematocrit 39.0 - 52.0 % 40.0 44.2 45.1  Platelets 150 - 400 K/uL 269 317 411(H)     CMP Latest Ref Rng & Units 10/17/2021 10/13/2021 10/12/2021  Glucose 70 - 99 mg/dL 115(H) 117(H) 173(H)  BUN 6 - 20 mg/dL 25(H) 23(H) 24(H)  Creatinine 0.61 - 1.24 mg/dL 0.85 0.75 0.74  Sodium 135 - 145 mmol/L 137 134(L) 132(L)  Potassium 3.5 - 5.1 mmol/L 4.0 3.9 3.6  Chloride 98 - 111 mmol/L 99 105 104  CO2 22 - 32 mmol/L 33(H) 23 24  Calcium 8.9 - 10.3 mg/dL 8.6(L) 7.9(L) 7.8(L)  Total Protein 6.5 - 8.1 g/dL 7.1 6.0(L) -  Total Bilirubin 0.3 - 1.2 mg/dL 0.8 0.7 -  Alkaline Phos 38 - 126 U/L 134(H) 126 -  AST 15 - 41 U/L 17 20 -  ALT 0 - 44 U/L 24 36 -      RADIOGRAPHIC STUDIES: I have personally reviewed the radiological images as listed and agreed with the findings in the report. No results found.    No orders of the defined types were placed in this encounter.  All questions were answered. The patient knows to call the clinic with any problems, questions or concerns. No barriers to learning was detected. The total time spent in the appointment was 30 minutes.     [name], MD 10/17/2021   I, Wilburn Mylar, am acting as scribe for [name], MD.   I have reviewed the above documentation for accuracy and completeness, and I agree with the above.

## 2021-10-18 LAB — CALCITRIOL (1,25 DI-OH VIT D): Vit D, 1,25-Dihydroxy: 23.5 pg/mL — ABNORMAL LOW (ref 24.8–81.5)

## 2021-10-20 LAB — SELENIUM SERUM: Selenium: 139 ug/L (ref 93–198)

## 2021-10-21 ENCOUNTER — Other Ambulatory Visit: Payer: Self-pay | Admitting: Nurse Practitioner

## 2021-10-21 ENCOUNTER — Telehealth: Payer: Self-pay

## 2021-10-21 ENCOUNTER — Other Ambulatory Visit: Payer: Self-pay

## 2021-10-21 ENCOUNTER — Encounter: Payer: Self-pay | Admitting: Hematology

## 2021-10-21 ENCOUNTER — Other Ambulatory Visit: Payer: Self-pay | Admitting: Hematology

## 2021-10-21 LAB — ZINC: Zinc: 136 ug/dL — ABNORMAL HIGH (ref 44–115)

## 2021-10-21 LAB — COPPER, SERUM: Copper: 164 ug/dL — ABNORMAL HIGH (ref 69–132)

## 2021-10-21 MED ORDER — HYDROMORPHONE HCL 4 MG PO TABS
8.0000 mg | ORAL_TABLET | ORAL | 0 refills | Status: DC | PRN
Start: 2021-10-21 — End: 2021-12-09
  Filled 2021-10-21: qty 136, 5d supply, fill #0
  Filled 2021-10-21: qty 136, 12d supply, fill #0

## 2021-10-21 MED ORDER — HYDROMORPHONE HCL 4 MG PO TABS
8.0000 mg | ORAL_TABLET | ORAL | 0 refills | Status: DC | PRN
Start: 2021-10-21 — End: 2021-11-20
  Filled 2021-10-21: qty 44, 3d supply, fill #0
  Filled 2021-10-21: qty 44, 1d supply, fill #0
  Filled 2021-10-21: qty 44, 3d supply, fill #0
  Filled 2021-10-21: qty 180, 12d supply, fill #0

## 2021-10-21 MED FILL — Fosaprepitant Dimeglumine For IV Infusion 150 MG (Base Eq): INTRAVENOUS | Qty: 5 | Status: AC

## 2021-10-21 NOTE — Progress Notes (Signed)
[place]   [phone] Fax:([phone]) [phone]   Clinic Follow up Note   Patient Care Team: [name], MD as PCP - General (Internal Medicine) [name], MD as Consulting Physician (Oncology) [name], RN (Inactive) as Oncology Nurse Navigator Pickenpack-Cousar, [name], NP as Nurse Practitioner (Nurse Practitioner)  Date of Service:  10/22/2021  CHIEF COMPLAINT: f/u of metastatic colon cancer  CURRENT THERAPY:  First line FOLFOX q2 weeks starting 12/18/20. Bevacizumab added with cycle 2. Added Irinotecan from C6 on 03/13/21. Oxaliplatin discontinued after C7 due to allergy reactions. 5FU decreased to 2400 mg on 07/17/21 C9  ASSESSMENT & PLAN:  Ricardo [name] is a 56 y.o. male with   1. 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 Work up showed mass in the terminal ileum with extensive omental nodularity and LN involvement. Baseline tumor marker CA 19-9 and CEA were normal. 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 This confirms stage IV metastatic cancer from cecum -He began first-line palliative chemo with FOLFOX on 12/18/20 -FO shows KRAS/NRAS wildtype, Bevacizumab was added with cycle 2 -Treatment was escalated to [facility] with cycle 6 FOLFOX on 03/13/21,  He tolerated well -He underwent laparoscopy on 05/13/21 by Dr. Clovis Riley, unfortunately due to his extensive peritoneal metastasis he was not a candidate for cytoreductive surgery/HIPEC. -restaging CT 07/01/21 showed stable disease  -given recent SBO, I restarted  chemotherapy FOLFOXFIRI On 09/18/21. He tolerated well but was readmitted for abdominal pain  -To my surprise, he actually is doing reasonably well after recent hospital discharge, performed status improved.  He strongly wanted to try chemo again. He is scheduled to restart FOLFOXFIRI today, 10/22/21. Lab reviewed, adequate to proceed  -due to his limited PS, will give next cycle in 3 weeks   2. Symptom Management: Constipation, abdominal pain, small bowl obstruction  -he has been admitted several times in the last two months due to recurrent abdominal pain, episodes of nausea and vomiting, and small bowel obstruction. He required PEG placement for venting purposes. -He is on TPN, will continue  -he reports today he has still not had a bowel movement (last prior to hospitalization on 09/30/21) -f/u with clinic for pain management.  He is on high dose fentanyl patch and dilaudid    3. Genetic testing -He has brother with prostate cancer and sister with breast cancer in their 3s. He is eligible for genetic testing.  -Negative result   4. Social Support -He is an avid cyclist. Last long distant riding was 06/2020 across [place]. He has not biked in the 8-9 weeks before cancer diagnosis due to symptoms. His weight heavily fluctuates with changes in his activity level, diet and biking. -He is married with 1 adult son. His brother Ricardo [name] is in town. He works as a Designer, television/film set at [employer]. He may take time off work based on how to tolerate chemotherapy. -He notes he has good family support, very close with his wife and 67 year old son who currently lives in [place]    5.  Goals of care discussion, limited code  -Due to his peritoneal metastasis, he is  not a candidate for cytoreductive surgery with HIPEC -He understands his cancer is metastatic, not curable, but still treatable -In the end, he does not want to suffer.  He strongly desires to have a clear head to say goodbye to his wife and son when  the time comes.   -He does not want prolonged life support     PLAN: -restart FOLFOXFIRI, with same dose as 1/5, today -GCSF on day 3  -f/u in one week  after chemo with [facility] -lab, f/u and chemo in 3 weeks    No problem-specific Assessment & Plan notes found for this encounter.   SUMMARY OF ONCOLOGIC HISTORY: Oncology History Overview Note  Cancer Staging metastatic cecal cancer Staging form: Colon and Rectum, AJCC 8th Edition - Clinical stage from 11/29/2020: Stage IVC (cTX, cN2, pM1c) - Signed by [name], MD on 12/04/2020 Stage prefix: Initial diagnosis Histologic grade (G): G3 Histologic grading system: 4 grade system    metastatic cecal cancer  11/27/2020 Imaging   CT Angio CAP  IMPRESSION: 1. Thickening of the terminal ileum with associated proximal mid to distal small bowel obstruction. Findings could be due to an ileitis versus malignancy. Nonspecific mesenteric edema could be due to engorgement versus metastases. No associated bowel perforation. Recommend endoscopy for further evaluation. 2. Indeterminate right lower quadrant lymphadenopathy. 3. Scattered colonic diverticulosis with no acute diverticulitis. 4. Stable right hepatic lobe subcentimeter hyperdensity likely represents a hepatic hemangioma. 5. No acute vascular abnormality. Aortic Atherosclerosis (ICD10-I70.0) - mild. 6. No acute intrathoracic abnormality.   11/28/2020 Imaging   CT AP  IMPRESSION: 1. There is masslike, circumferential thickening of the terminal ileum and cecal base near the ileocecal valve and abnormally enlarged lymph nodes in the right lower quadrant mesentery adjacent to the terminal ileum measuring up to 2.3 x 1.4 cm. 2. There is extensive omental and peritoneal nodularity and caking throughout the abdomen. 3. Findings are highly concerning for primary colon malignancy with nodal and peritoneal metastatic disease. 4. Small volume perihepatic and perisplenic ascites. 5. The  small bowel is generally decompressed, with full some fluid-filled, nondistended loops throughout. There is transit of oral enteric contrast to the terminal ileum. No evidence of overt bowel obstruction at this time. Esophagogastric tube is position with tip and side port below the diaphragm. 6. Atelectasis or consolidation of the dependent bilateral lung bases, new compared to prior examination.   Aortic Atherosclerosis (ICD10-I70.0).     11/29/2020 Surgery   EXPLORATORY LAPAROTOMY, PARTIAL BOWEL RESECTION, POSSIBLE OSTOMY CREATION, PERITIONEAL BIOPSY, INSERTION OF GASTROSTOMY TUBE by Dr [name]   11/29/2020 Initial Biopsy   FINAL MICROSCOPIC DIAGNOSIS:   AB. 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    COMMENT:   Immunohistochemistry (IHC) for CK20 and CDX-2 is strong and diffusely  positive.  CK7, TTF-1, Synaptophysin, Chromogranin and CD56 are  negative.  The immunophenotype is compatible with origin from the lower  gastrointestinal tract.  IHC for MMR will be reported separately.  Case  preliminarily discussed with Dr. [name] on 12/02/2020.   At the request of Dr. [name], [phone] was reviewed in retrospect.  Review of the submitted sections confirms the presence of acute  appendicitis.  No malignancy is identified.    11/29/2020 Cancer Staging   Staging form: Colon and Rectum, AJCC 8th Edition - Clinical stage from 11/29/2020: Stage IVC (cTX, cN2, pM1c) - Signed by [name], MD on 12/04/2020 Stage prefix: Initial diagnosis Histologic grade (  G): G3 Histologic grading system: 4 grade system    12/04/2020 Initial Diagnosis   Cancer of ascending colon metastatic to intra-abdominal lymph node ([place])    Chemotherapy   FOLFOX q2weeks    12/18/2020 - 02/15/2021 Chemotherapy      Patient is on Antibody Plan: COLORECTAL BEVACIZUMAB Q14D     01/03/2021 Imaging   CT A/P IMPRESSION: 1.  Wall thickening of the terminal ileum again seen. Fluid-filled distal small bowel, ascending, and transverse colon without obstruction. 2. Equivocal improvement in omental and peritoneal metastatic disease with slightly improved small volume perihepatic and perisplenic ascites. Right lower quadrant mesenteric adenopathy is similar or mildly improved. 3. Sigmoid colonic diverticulosis. Mild mural wall thickening in the sigmoid, no acute diverticulitis. 4. Gastrostomy tube in place, balloon normally positioned in the stomach. 5. Chronic bilateral L5 pars interarticularis defects with trace anterolisthesis of L5 on S1. Chronic avascular necrosis of the left femoral head.   01/16/2021 Genetic Testing   Negative genetic testing. CTNNA1 [id number] VUS, RAD51D p.G140E VUS and TSC1 p.R768H VUS found on the CancerNext-Expanded+RNAinsight.  The CancerNext-Expanded gene panel offered by [facility] and includes sequencing and rearrangement analysis for the following 77 genes: AIP, ALK, APC*, ATM*, AXIN2, BAP1, BARD1, BLM, BMPR1A, BRCA1*, BRCA2*, BRIP1*, CDC73, CDH1*, CDK4, CDKN1B, CDKN2A, CHEK2*, CTNNA1, DICER1, FANCC, FH, FLCN, GALNT12, KIF1B, LZTR1, MAX, MEN1, MET, MLH1*, MSH2*, MSH3, MSH6*, MUTYH*, NBN, NF1*, NF2, NTHL1, PALB2*, PHOX2B, PMS2*, POT1, PRKAR1A, PTCH1, PTEN*, RAD51C*, RAD51D*, RB1, RECQL, RET, SDHA, SDHAF2, SDHB, SDHC, SDHD, SMAD4, SMARCA4, SMARCB1, SMARCE1, STK11, SUFU, TMEM127, TP53*, TSC1, TSC2, VHL and XRCC2 (sequencing and deletion/duplication); EGFR, EGLN1, HOXB13, KIT, MITF, PDGFRA, POLD1, and POLE (sequencing only); EPCAM and GREM1 (deletion/duplication only). DNA and RNA analyses performed for * genes. The report date is Jan 16, 2021.   02/24/2021 Imaging   CT AP  IMPRESSION: 1. Interval resolution of previously seen small volume ascites throughout the abdomen and pelvis. There is redemonstrated, ill-defined stranding and thickening of the peritoneal surfaces and omentum,  which is somewhat diminished compared to prior examination. 2. Interval improvement in right lower quadrant mesocolon lymph nodes. 3. Findings are consistent with treatment response of nodal and peritoneal metastatic disease. 4. Unchanged, matted appearing thickening of the terminal ileum and cecal base. 5. Sigmoid diverticulosis with wall thickening of the mid to distal sigmoid, similar to prior examination. Findings are most suggestive of chronic sequelae of diverticulitis.   Aortic Atherosclerosis (ICD10-I70.0).   03/13/2021 -  Chemotherapy   Patient is on Treatment Plan : COLORECTAL FOLFOXIRI + Bevacizumab q14d     05/13/2021 Procedure   OPERATIVE PROCEDURE: Laparoscopy and peritoneal biopsy.  SURGEON: Merlyn Albert. Clovis Riley, MD   05/13/2021 Pathology Results   Final Pathologic Diagnosis    A. PERITONEAL, BIOPSY :              Metastatic adenocarcinoma.               See comment.    Comment    The biopsies show predominately fibrosis  and fibroadipose tissue with a small focus of moderately differentiated adenocarcinoma. Given the patient's history, the findings favor metastasis of the patient's known colonic cancer.     07/01/2021 Imaging   CT CAP  IMPRESSION: Chest Impression:   1. No evidence of thoracic metastasis.   Abdomen / Pelvis Impression:   1. Thickening through the terminal ileum similar to prior. No ileocecal lymphadenopathy. 2. One focus linear thickening the peritoneum adjacent to loops of small bowel in the ventral  abdomen which extend to the proximal sigmoid colon are more prominent than prior and concerning for residual peritoneal carcinoma. 3. No evidence of solid organ metastasis in the abdomen pelvis.      INTERVAL HISTORY:  Ricardo [name] is here for a follow up of metastatic colon cancer. He was last seen by me on 10/17/21. He was seen in the infusion area. He reports he is getting stronger.   All other systems were reviewed with the patient  and are negative.  MEDICAL HISTORY:  Past Medical History:  Diagnosis Date   Arthritis    Class 2 obesity 09/14/2021   Colon cancer ([place])    with metastasis   Family history of adverse reaction to anesthesia    mother had problem with it due to her asthma   Family history of breast cancer    Family history of prostate cancer    GAD (generalized anxiety disorder) 09/03/2021   GERD (gastroesophageal reflux disease) 09/03/2021   History of COVID-19 04/30/2021   Formatting of this note might be different from the original. 01/2021, asymptomatic   Hyperlipidemia 11/27/2020   Pleural effusion, left 09/06/2021   Sleep disorder 11/10/2018    SURGICAL HISTORY: Past Surgical History:  Procedure Laterality Date   BOWEL RESECTION N/A 11/29/2020   Procedure: PARTIAL BOWEL RESECTION;  Surgeon: Jovita Kussmaul, MD;  Location: [place];  Service: General;  Laterality: N/A;   GASTROSTOMY Left 11/29/2020   Procedure: INSERTION OF GASTROSTOMY TUBE;  Surgeon: Jovita Kussmaul, MD;  Location: [place];  Service: General;  Laterality: Left;   IR GASTROSTOMY TUBE MOD SED  09/11/2021   IR IMAGING GUIDED PORT INSERTION  12/12/2020   LAPAROSCOPIC APPENDECTOMY N/A 01/17/2019   Procedure: APPENDECTOMY LAPAROSCOPIC;  Surgeon: Ralene Ok, MD;  Location: [place];  Service: General;  Laterality: N/A;   LAPAROTOMY N/A 11/29/2020   Procedure: EXPLORATORY LAPAROTOMY;  Surgeon: Jovita Kussmaul, MD;  Location: [place];  Service: General;  Laterality: N/A;  PUT CASE IN ROOM 1 STARTING AT 9:30AM FOR 120 MIN   OSTOMY N/A 11/29/2020   Procedure: POSSIBLE OSTOMY CREATION;  Surgeon: Jovita Kussmaul, MD;  Location: [place];  Service: General;  Laterality: N/A;   RECTAL BIOPSY N/A 11/29/2020   Procedure: PERITIONEAL BIOPSY;  Surgeon: Jovita Kussmaul, MD;  Location: [place];  Service: General;  Laterality: N/A;   SHOULDER SURGERY Left 09/19/2015    I have reviewed the social history and family history with the patient and they are unchanged from  previous note.  ALLERGIES:  is allergic to oxaliplatin.  MEDICATIONS:  Current Outpatient Medications  Medication Sig Dispense Refill   dexamethasone (DECADRON) 4 MG tablet Take 1 tablet (4 mg total) by mouth 2 (two) times daily. 60 tablet 0   DULoxetine (CYMBALTA) 30 MG capsule Take 1 capsule (30 mg total) by mouth daily. 60 capsule 0   fentaNYL (DURAGESIC) 100 MCG/HR Place 2 patches onto the skin every 3 (three) days. Along with 50 MCG/HR patch for a total of 250 MCG/HR dose. 20 patch 0   fentaNYL (DURAGESIC) 50 MCG/HR Place 1 patch onto the skin every 3 (three) days. Along with 2 of 100 MCG/HR patch for a total of 250 MCG/HR dose. 10 patch 0   HYDROmorphone (DILAUDID) 4 MG tablet Take 2 tablets (8 mg total) by mouth every 3 (three) hours as needed for severe pain or moderate pain. Take 2 tablets ($RemoveBe'8mg'KpxUNlLMW$ ) every 3-4 hours as needed for breakthrough pain. 44 tablet 0  HYDROmorphone (DILAUDID) 4 MG tablet Take 2 tablets (8 mg total) by mouth every 4 (four) hours as needed for severe pain (Take 2 tablets (27m) every 3-4 hourse as needed for severe pain.). 136 tablet 0   LORazepam (ATIVAN) 0.5 MG tablet Take 1 tablet (0.5 mg total) by mouth every 6 (six) hours as needed for anxiety. 90 tablet 0   naloxone (NARCAN) nasal spray 4 mg/0.1 mL Use 1 spray for opioid overdose (Patient taking differently: 0.4 mg once as needed (opioid overdose).) 2 each 1   Nutritional Supplements (FEEDING SUPPLEMENT, BOOST BREEZE,) LIQD Take 1 each by mouth 3 (three) times daily before meals. 10000 mL 0   pantoprazole sodium (PROTONIX) 40 mg Place 40 mg into feeding tube daily. 30 packet 1   polyethylene glycol (MIRALAX / GLYCOLAX) 17 g packet Take 17 g by mouth daily as needed for mild constipation.     prochlorperazine (COMPAZINE) 10 MG tablet Take 1 tablet (10 mg total) by mouth every 6 (six) hours as needed for nausea or vomiting. 30 tablet 2   Current Facility-Administered Medications  Medication Dose Route Frequency  Provider Last Rate Last Admin   0.9 %  sodium chloride infusion   Intravenous PRN Causey, LCharlestine Massed NP        PHYSICAL EXAMINATION: ECOG PERFORMANCE STATUS: 2 - Symptomatic, <50% confined to bed  There were no vitals filed for this visit. Wt Readings from Last 3 Encounters:  10/22/21 239 lb 12 oz (108.7 kg)  10/17/21 241 lb (109.3 kg)  10/12/21 235 lb 7.2 oz (106.8 kg)     GENERAL:alert, no distress and comfortable SKIN: skin color normal, no rashes or significant lesions EYES: normal, Conjunctiva are pink and non-injected, sclera clear  NEURO: alert & oriented x 3 with fluent speech  LABORATORY DATA:  I have reviewed the data as listed CBC Latest Ref Rng & Units 10/22/2021 10/17/2021 10/07/2021  WBC 4.0 - 10.5 K/uL 9.4 14.0(H) 12.2(H)  Hemoglobin 13.0 - 17.0 g/dL 11.1(L) 13.1 14.2  Hematocrit 39.0 - 52.0 % 34.4(L) 40.0 44.2  Platelets 150 - 400 K/uL 186 269 317     CMP Latest Ref Rng & Units 10/22/2021 10/17/2021 10/13/2021  Glucose 70 - 99 mg/dL 81 115(H) 117(H)  BUN 6 - 20 mg/dL 25(H) 25(H) 23(H)  Creatinine 0.61 - 1.24 mg/dL 0.77 0.85 0.75  Sodium 135 - 145 mmol/L 136 137 134(L)  Potassium 3.5 - 5.1 mmol/L 4.3 4.0 3.9  Chloride 98 - 111 mmol/L 101 99 105  CO2 22 - 32 mmol/L 29 33(H) 23  Calcium 8.9 - 10.3 mg/dL 8.1(L) 8.6(L) 7.9(L)  Total Protein 6.5 - 8.1 g/dL 6.2(L) 7.1 6.0(L)  Total Bilirubin 0.3 - 1.2 mg/dL 0.7 0.8 0.7  Alkaline Phos 38 - 126 U/L 96 134(H) 126  AST 15 - 41 U/L _0 ALT 0 - 44 U/L 16 24 36      RADIOGRAPHIC STUDIES: I have personally reviewed the radiological images as listed and agreed with the findings in the report. No results found.    No orders of the defined types were placed in this encounter.  All questions were answered. The patient knows to call the clinic with any problems, questions or concerns. No barriers to learning was detected. The total time spent in the appointment was 30 minutes.     YTruitt Merle MD 10/22/2021   I,  KWilburn Mylar am acting as scribe for YTruitt Merle MD.   I have reviewed the above  documentation for accuracy and completeness, and I agree with the above.

## 2021-10-21 NOTE — Telephone Encounter (Signed)
Patient's local pharmacy does not have dilaudid in stock. Requested to send to another pharmacy to prevent delay in medication regimen. New prescription has been sent to [facility]).

## 2021-10-21 NOTE — Telephone Encounter (Signed)
Mr. Hanawalt called our office this morning to inform us that his pharmacy wasn't able to fill his prescription for his Dilaudid. I called his pharmacy and they stated that they didn't have the medication. I notified [name], NP. She reached out to [facility] and sent the prescription there. They don't have the full prescription, but will order more and have the rest of the prescription tomorrow. I relayed this information to Mr. Pikus. Understanding verbalized. All questions answered. Advised to call our office with any questions/concerns.

## 2021-10-22 ENCOUNTER — Inpatient Hospital Stay: Payer: BC Managed Care – PPO

## 2021-10-22 ENCOUNTER — Other Ambulatory Visit: Payer: Self-pay

## 2021-10-22 ENCOUNTER — Encounter: Payer: Self-pay | Admitting: Hematology

## 2021-10-22 ENCOUNTER — Encounter: Payer: Self-pay | Admitting: Nurse Practitioner

## 2021-10-22 ENCOUNTER — Inpatient Hospital Stay (HOSPITAL_BASED_OUTPATIENT_CLINIC_OR_DEPARTMENT_OTHER): Payer: BC Managed Care – PPO | Admitting: Nurse Practitioner

## 2021-10-22 ENCOUNTER — Inpatient Hospital Stay (HOSPITAL_BASED_OUTPATIENT_CLINIC_OR_DEPARTMENT_OTHER): Payer: BC Managed Care – PPO | Admitting: Hematology

## 2021-10-22 VITALS — BP 133/71 | HR 60 | Temp 98.2°F | Resp 16 | Wt 239.8 lb

## 2021-10-22 DIAGNOSIS — C786 Secondary malignant neoplasm of retroperitoneum and peritoneum: Secondary | ICD-10-CM

## 2021-10-22 DIAGNOSIS — K56609 Unspecified intestinal obstruction, unspecified as to partial versus complete obstruction: Secondary | ICD-10-CM | POA: Diagnosis not present

## 2021-10-22 DIAGNOSIS — R110 Nausea: Secondary | ICD-10-CM

## 2021-10-22 DIAGNOSIS — G893 Neoplasm related pain (acute) (chronic): Secondary | ICD-10-CM | POA: Diagnosis not present

## 2021-10-22 DIAGNOSIS — F419 Anxiety disorder, unspecified: Secondary | ICD-10-CM

## 2021-10-22 DIAGNOSIS — C772 Secondary and unspecified malignant neoplasm of intra-abdominal lymph nodes: Secondary | ICD-10-CM

## 2021-10-22 DIAGNOSIS — C182 Malignant neoplasm of ascending colon: Secondary | ICD-10-CM

## 2021-10-22 DIAGNOSIS — R530 Neoplastic (malignant) related fatigue: Secondary | ICD-10-CM

## 2021-10-22 DIAGNOSIS — Z515 Encounter for palliative care: Secondary | ICD-10-CM

## 2021-10-22 DIAGNOSIS — Z95828 Presence of other vascular implants and grafts: Secondary | ICD-10-CM

## 2021-10-22 LAB — CBC WITH DIFFERENTIAL (CANCER CENTER ONLY)
Abs Immature Granulocytes: 0.04 10*3/uL (ref 0.00–0.07)
Basophils Absolute: 0 10*3/uL (ref 0.0–0.1)
Basophils Relative: 0 %
Eosinophils Absolute: 0 10*3/uL (ref 0.0–0.5)
Eosinophils Relative: 0 %
HCT: 34.4 % — ABNORMAL LOW (ref 39.0–52.0)
Hemoglobin: 11.1 g/dL — ABNORMAL LOW (ref 13.0–17.0)
Immature Granulocytes: 0 %
Lymphocytes Relative: 8 %
Lymphs Abs: 0.7 10*3/uL (ref 0.7–4.0)
MCH: 27.7 pg (ref 26.0–34.0)
MCHC: 32.3 g/dL (ref 30.0–36.0)
MCV: 85.8 fL (ref 80.0–100.0)
Monocytes Absolute: 0.7 10*3/uL (ref 0.1–1.0)
Monocytes Relative: 7 %
Neutro Abs: 7.9 10*3/uL — ABNORMAL HIGH (ref 1.7–7.7)
Neutrophils Relative %: 85 %
Platelet Count: 186 10*3/uL (ref 150–400)
RBC: 4.01 MIL/uL — ABNORMAL LOW (ref 4.22–5.81)
RDW: 18.1 % — ABNORMAL HIGH (ref 11.5–15.5)
WBC Count: 9.4 10*3/uL (ref 4.0–10.5)
nRBC: 0 % (ref 0.0–0.2)

## 2021-10-22 LAB — CMP (CANCER CENTER ONLY)
ALT: 16 U/L (ref 0–44)
AST: 16 U/L (ref 15–41)
Albumin: 2.8 g/dL — ABNORMAL LOW (ref 3.5–5.0)
Alkaline Phosphatase: 96 U/L (ref 38–126)
Anion gap: 6 (ref 5–15)
BUN: 25 mg/dL — ABNORMAL HIGH (ref 6–20)
CO2: 29 mmol/L (ref 22–32)
Calcium: 8.1 mg/dL — ABNORMAL LOW (ref 8.9–10.3)
Chloride: 101 mmol/L (ref 98–111)
Creatinine: 0.77 mg/dL (ref 0.61–1.24)
GFR, Estimated: >60 mL/min (ref >60)
Glucose, Bld: 81 mg/dL (ref 70–99)
Potassium: 4.3 mmol/L (ref 3.5–5.1)
Sodium: 136 mmol/L (ref 135–145)
Total Bilirubin: 0.7 mg/dL (ref 0.3–1.2)
Total Protein: 6.2 g/dL — ABNORMAL LOW (ref 6.5–8.1)

## 2021-10-22 MED ORDER — LORATADINE 10 MG PO TABS
10.0000 mg | ORAL_TABLET | Freq: Once | ORAL | Status: AC
  Administered 2021-10-22: 10 mg via ORAL
  Filled 2021-10-22: qty 1

## 2021-10-22 MED ORDER — HEPARIN SOD (PORK) LOCK FLUSH 100 UNIT/ML IV SOLN: 500.0000 [IU] | Freq: Once | INTRAVENOUS | Status: DC | PRN

## 2021-10-22 MED ORDER — SODIUM CHLORIDE 0.9 % IV SOLN
150.0000 mg/m2 | Freq: Once | INTRAVENOUS | Status: AC
  Administered 2021-10-22: 360 mg via INTRAVENOUS
  Filled 2021-10-22: qty 18

## 2021-10-22 MED ORDER — METHYLPREDNISOLONE SODIUM SUCC 125 MG IJ SOLR
125.0000 mg | Freq: Once | INTRAMUSCULAR | Status: AC
  Administered 2021-10-22: 125 mg via INTRAVENOUS
  Filled 2021-10-22: qty 2

## 2021-10-22 MED ORDER — LEUCOVORIN CALCIUM INJECTION 350 MG
400.0000 mg/m2 | Freq: Once | INTRAVENOUS | Status: AC
  Administered 2021-10-22: 972 mg via INTRAVENOUS
  Filled 2021-10-22: qty 48.6

## 2021-10-22 MED ORDER — SODIUM CHLORIDE 0.9 % IV SOLN: Freq: Once | INTRAVENOUS | Status: AC

## 2021-10-22 MED ORDER — OXALIPLATIN CHEMO INJECTION 100 MG/20ML
70.0000 mg/m2 | Freq: Once | INTRAVENOUS | Status: AC
  Administered 2021-10-22: 170 mg via INTRAVENOUS
  Filled 2021-10-22: qty 34

## 2021-10-22 MED ORDER — SODIUM CHLORIDE 0.9% FLUSH
10.0000 mL | Freq: Once | INTRAVENOUS | Status: AC
  Administered 2021-10-22: 10 mL

## 2021-10-22 MED ORDER — DEXTROSE 5 % IV SOLN: Freq: Once | INTRAVENOUS | Status: AC

## 2021-10-22 MED ORDER — SODIUM CHLORIDE 0.9% FLUSH: 10.0000 mL | INTRAVENOUS | Status: DC | PRN

## 2021-10-22 MED ORDER — SODIUM CHLORIDE 0.9 % IV SOLN
150.0000 mg | Freq: Once | INTRAVENOUS | Status: AC
  Administered 2021-10-22: 150 mg via INTRAVENOUS
  Filled 2021-10-22: qty 150

## 2021-10-22 MED ORDER — FAMOTIDINE IN NACL 20-0.9 MG/50ML-% IV SOLN
20.0000 mg | Freq: Once | INTRAVENOUS | Status: AC
  Administered 2021-10-22: 20 mg via INTRAVENOUS
  Filled 2021-10-22: qty 50

## 2021-10-22 MED ORDER — ATROPINE SULFATE 1 MG/ML IV SOLN
0.5000 mg | Freq: Once | INTRAVENOUS | Status: AC | PRN
  Administered 2021-10-22: 0.5 mg via INTRAVENOUS
  Filled 2021-10-22: qty 1

## 2021-10-22 MED ORDER — FLUOROURACIL CHEMO INJECTION 5 GM/100ML
2400.0000 mg/m2 | Freq: Once | INTRAVENOUS | Status: DC
  Filled 2021-10-22: qty 117

## 2021-10-22 MED ORDER — MONTELUKAST SODIUM 10 MG PO TABS
10.0000 mg | ORAL_TABLET | Freq: Once | ORAL | Status: AC
  Administered 2021-10-22: 10 mg via ORAL
  Filled 2021-10-22: qty 1

## 2021-10-22 MED ORDER — DIPHENHYDRAMINE HCL 50 MG/ML IJ SOLN
50.0000 mg | Freq: Once | INTRAMUSCULAR | Status: AC
  Administered 2021-10-22: 50 mg via INTRAVENOUS
  Filled 2021-10-22: qty 1

## 2021-10-22 MED ORDER — PALONOSETRON HCL INJECTION 0.25 MG/5ML
0.2500 mg | Freq: Once | INTRAVENOUS | Status: AC
  Administered 2021-10-22: 0.25 mg via INTRAVENOUS
  Filled 2021-10-22: qty 5

## 2021-10-22 MED ORDER — FLUOROURACIL CHEMO INJECTION 5 GM/100ML: 2400.0000 mg/m2 | Freq: Once | INTRAVENOUS | Status: DC

## 2021-10-22 MED ORDER — SODIUM CHLORIDE 0.9 % IV SOLN: INTRAVENOUS | Status: DC

## 2021-10-22 MED ORDER — SODIUM CHLORIDE 0.9 % IV SOLN
2400.0000 mg/m2 | INTRAVENOUS | Status: DC
  Administered 2021-10-22: 5850 mg via INTRAVENOUS
  Filled 2021-10-22: qty 117

## 2021-10-22 NOTE — Patient Instructions (Addendum)
[facility]   Discharge Instructions: Thank you for choosing [facility] to provide your oncology and hematology care.   If you have a lab appointment with the [facility], please go directly to the [facility] and check in at the registration area.   Wear comfortable clothing and clothing appropriate for easy access to any Portacath or PICC line.   We strive to give you quality time with your provider. You may need to reschedule your appointment if you arrive late (15 or more minutes).  Arriving late affects you and other patients whose appointments are after yours.  Also, if you miss three or more appointments without notifying the office, you may be dismissed from the clinic at the providers discretion.      For prescription refill requests, have your pharmacy contact our office and allow 72 hours for refills to be completed.    Today you received the following chemotherapy and/or immunotherapy agents: Irinotecan, Oxaliplatin, Leucovorin, and Fluorouracil (Adrucil)       To help prevent nausea and vomiting after your treatment, we encourage you to take your nausea medication as directed.  BELOW ARE SYMPTOMS THAT SHOULD BE REPORTED IMMEDIATELY: *FEVER GREATER THAN 100.4 F (38 C) OR HIGHER *CHILLS OR SWEATING *NAUSEA AND VOMITING THAT IS NOT CONTROLLED WITH YOUR NAUSEA MEDICATION *UNUSUAL SHORTNESS OF BREATH *UNUSUAL BRUISING OR BLEEDING *URINARY PROBLEMS (pain or burning when urinating, or frequent urination) *BOWEL PROBLEMS (unusual diarrhea, constipation, pain near the anus) TENDERNESS IN MOUTH AND THROAT WITH OR WITHOUT PRESENCE OF ULCERS (sore throat, sores in mouth, or a toothache) UNUSUAL RASH, SWELLING OR PAIN  UNUSUAL VAGINAL DISCHARGE OR ITCHING   Items with * indicate a potential emergency and should be followed up as soon as possible or go to the Emergency Department if any problems should occur.  Please show the  CHEMOTHERAPY ALERT CARD or IMMUNOTHERAPY ALERT CARD at check-in to the Emergency Department and triage nurse.  Should you have questions after your visit or need to cancel or reschedule your appointment, please contact [facility]  Dept: [phone]  and follow the prompts.  Office hours are 8:00 a.m. to 4:30 p.m. Monday - Friday. Please note that voicemails left after 4:00 p.m. may not be returned until the following business day.  We are closed weekends and major holidays. You have access to a nurse at all times for urgent questions. Please call the main number to the clinic Dept: [phone] and follow the prompts.   For any non-urgent questions, you may also contact your provider using MyChart. We now offer e-Visits for anyone 34 and older to request care online for non-urgent symptoms. For details visit [email or web address].   Also download the MyChart app! Go to the app store, search "MyChart", open the app, select [facility], and log in with your MyChart username and password.  Due to Covid, a mask is required upon entering the hospital/clinic. If you do not have a mask, one will be given to you upon arrival. For doctor visits, patients may have 1 support person aged 6 or older with them. For treatment visits, patients cannot have anyone with them due to current Covid guidelines and our immunocompromised population.   The chemotherapy medication bag should finish at 46 hours, 96 hours, or 7 days. For example, if your pump is scheduled for 46 hours and it was put on at 4:00 p.m., it should finish at 2:00 p.m. the day it is  scheduled to come off regardless of your appointment time.     Estimated time to finish at 1:30 PM on Friday, 2/10.   If the display on your pump reads "Low Volume" and it is beeping, take the batteries out of the pump and come to the cancer center for it to be taken off.   If the pump alarms go off prior to the pump reading "Low Volume"  then call [phone] and someone can assist you.  If the plunger comes out and the chemotherapy medication is leaking out, please use your home chemo spill kit to clean up the spill. Do NOT use paper towels or other household products.  If you have problems or questions regarding your pump, please call either [phone] (24 hours a day) or the cancer center Monday-Friday 8:00 a.m.- 4:30 p.m. at the clinic number and we will assist you. If you are unable to get assistance, then go to the nearest Emergency Department and ask the staff to contact the IV team for assistance.

## 2021-10-22 NOTE — Progress Notes (Signed)
[place]  Telephone:(336) [phone] [phone]) [phone]   Name: Ricardo [name] Date: 10/22/2021 MRN: [id number]  DOB: 1965-09-22  Patient Care Team: [name], MD as PCP - General (Internal Medicine) [name], MD as Consulting Physician (Oncology) [name], RN (Inactive) as Oncology Nurse Navigator Pickenpack-Cousar, [name], NP as Nurse Practitioner (Nurse Practitioner)    INTERVAL HISTORY: Ricardo [name] is a 56 y.o. 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 Patient recently hospitalized 12/14 and 12/20 for SBO which was improved by conservative measures. CT of abdomen pelvis showed small bowel obstruction with transition point likely in the mid abdomen. Venting G-tube was placed prior to discharge. [name] was discharged on 09/13/2021 and required readmission on 09/14/2021 via EMS from home with similar concerns of abdominal pain, nausea, and vomiting. Imaging showes improvement in gaseous distention, however continues to have significant G-tube output, abdominal pain, nausea, and vomiting.   SOCIAL HISTORY:     reports that he has never smoked. He has never used smokeless tobacco. He reports that he does not currently use alcohol. He reports that he does not use drugs.    PAST MEDICAL HISTORY: Past Medical History:  Diagnosis Date   Arthritis    Class 2 obesity 09/14/2021   Colon cancer ([place])    with metastasis   Family history of adverse reaction to anesthesia    mother had problem with it due to her asthma   Family history of breast cancer    Family history of prostate cancer    GAD (generalized anxiety disorder) 09/03/2021   GERD (gastroesophageal reflux disease) 09/03/2021   History of COVID-19 04/30/2021   Formatting of this note might be different from the original. 01/2021, asymptomatic   Hyperlipidemia 11/27/2020   Pleural effusion, left 09/06/2021    Sleep disorder 11/10/2018    ALLERGIES:  is allergic to oxaliplatin.  MEDICATIONS:  Current Outpatient Medications  Medication Sig Dispense Refill   dexamethasone (DECADRON) 4 MG tablet Take 1 tablet (4 mg total) by mouth 2 (two) times daily. 60 tablet 0   DULoxetine (CYMBALTA) 30 MG capsule Take 1 capsule (30 mg total) by mouth daily. 60 capsule 0   fentaNYL (DURAGESIC) 100 MCG/HR Place 2 patches onto the skin every 3 (three) days. Along with 50 MCG/HR patch for a total of 250 MCG/HR dose. 20 patch 0   fentaNYL (DURAGESIC) 50 MCG/HR Place 1 patch onto the skin every 3 (three) days. Along with 2 of 100 MCG/HR patch for a total of 250 MCG/HR dose. 10 patch 0   HYDROmorphone (DILAUDID) 4 MG tablet Take 2 tablets (8 mg total) by mouth every 3 (three) hours as needed for severe pain or moderate pain. Take 2 tablets (8mg ) every 3-4 hours as needed for breakthrough pain. 44 tablet 0   HYDROmorphone (DILAUDID) 4 MG tablet Take 2 tablets (8 mg total) by mouth every 4 (four) hours as needed for severe pain (Take 2 tablets (8mg ) every 3-4 hourse as needed for severe pain.). 136 tablet 0   LORazepam (ATIVAN) 0.5 MG tablet Take 1 tablet (0.5 mg total) by mouth every 6 (six) hours as needed for anxiety. 90 tablet 0   naloxone (NARCAN) nasal spray 4 mg/0.1 mL Use 1 spray for opioid overdose (Patient taking differently: 0.4 mg once as needed (opioid overdose).) 2 each 1   Nutritional Supplements (FEEDING SUPPLEMENT, BOOST BREEZE,) LIQD Take 1 each by  mouth 3 (three) times daily before meals. 10000 mL 0   pantoprazole sodium (PROTONIX) 40 mg Place 40 mg into feeding tube daily. 30 packet 1   polyethylene glycol (MIRALAX / GLYCOLAX) 17 g packet Take 17 g by mouth daily as needed for mild constipation.     prochlorperazine (COMPAZINE) 10 MG tablet Take 1 tablet (10 mg total) by mouth every 6 (six) hours as needed for nausea or vomiting. 30 tablet 2   Current Facility-Administered Medications  Medication Dose  Route Frequency Provider Last Rate Last Admin   0.9 %  sodium chloride infusion   Intravenous PRN Causey, Charlestine Massed, NP       Facility-Administered Medications Ordered in Other Visits  Medication Dose Route Frequency Provider Last Rate Last Admin   0.9 %  sodium chloride infusion   Intravenous Continuous [name], MD 500 mL/hr at 10/22/21 0818 New Bag at 10/22/21 0818   fluorouracil (ADRUCIL) 5,850 mg in sodium chloride 0.9 % 133 mL chemo infusion  2,400 mg/m2 (Treatment Plan Recorded) Intravenous 1 day or 1 dose [name], MD       heparin lock flush 100 unit/mL  500 Units Intracatheter Once PRN [name], MD       leucovorin 972 mg in dextrose 5 % 250 mL infusion  400 mg/m2 (Treatment Plan Recorded) Intravenous Once [name], MD 75 mL/hr at 10/22/21 1133 972 mg at 10/22/21 1133   oxaliplatin (ELOXATIN) 170 mg in dextrose 5 % 1,000 mL chemo infusion  70 mg/m2 (Treatment Plan Recorded) Intravenous Once [name], MD 259 mL/hr at 10/22/21 1131 170 mg at 10/22/21 1131   sodium chloride flush (NS) 0.9 % injection 10 mL  10 mL Intracatheter PRN [name], MD        VITAL SIGNS: There were no vitals taken for this visit. There were no vitals filed for this visit.   Estimated body mass index is 33.44 kg/m as calculated from the following:   Height as of 10/17/21: 5\' 11"  (1.803 m).   Weight as of an earlier encounter on 10/22/21: 239 lb 12 oz (108.7 kg).   PERFORMANCE STATUS (ECOG) : 1 - Symptomatic but completely ambulatory   Physical Exam General: NAD, resting in recliner  Lungs: Normal breathing pattern Cardio: RRR GI: venting PEG in place, soft, bowel sounds Neurological: AAOx3  IMPRESSION:  I saw Ricardo [name] today during his infusion appointment. No acute distress noted. He is comfortable in recliner. Shares he is feeling much better and pain remains well controlled. He has been able to go back to work some with known limitations and listening to his body. 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     Abdominal pain related to neoplasm Feels pain is better controlled. States he must follow regimen around the clock and stay ahead of his pain and discomfort.  Current regimen: 200 mcg of fentanyl patch which was increased during recent admission from 168mcg.  Patient is aware the goal is to hopefully gain a better sustainable pain control without treating symptoms consistently with breakthrough medications. Dilaudid 8 mg every 3-4 hours as needed for breakthrough pain. This was refilled on yesterday however due to limited supply patient will return to outpatient pharmacy for remainder of pills once the order arrives. Verbalized understanding.  Cymbalta 30 mg daily               2.    Nausea/vomiting Nausea and vomiting well controlled.  Takes medication as needed.  Compazine 10 mg as needed for nausea Pantoprazole 40 mg daily.   Decadron 4mg  twice daily  Octreotide has been added to his daily TPN in the home     3. Anxiety Ativan 0.5mg  every 6 hours as needed for nausea and anxiety.   4.  Constipation Patient reports he is having daily bowel movements.  Sometimes 2-3 times per day which is an improvement.   PLAN: No adjustments to current regimen. Refills recently sent in to pharmacy for pick-up.  He has been able to return to work on a limited basis which he is appreciative of.  Ongoing support and close evaluation of effective symptom management.  Goal is to keep him out of the hospital and improve quality of life.  I will plan to see patient back in the clinic 2-3 weeks with future oncology appointments.   Patient expressed understanding and was in agreement with this plan. He also understands that He can call the clinic at any time with any questions, concerns, or complaints.   Time Total: 25 min  Visit consisted of counseling and education dealing with the complex and emotionally intense issues of symptom management and  palliative care in the setting of serious and potentially life-threatening illness.Greater than 50%  of this time was spent counseling and coordinating care related to the above assessment and plan.  Signed by: Alda Lea, AGPCNP-BC [place]

## 2021-10-23 ENCOUNTER — Telehealth: Payer: Self-pay | Admitting: Hematology

## 2021-10-23 ENCOUNTER — Telehealth: Payer: Self-pay | Admitting: Nurse Practitioner

## 2021-10-23 NOTE — Telephone Encounter (Signed)
Left message with follow-up appointments per 2/8 los. °

## 2021-10-23 NOTE — Telephone Encounter (Signed)
Scheduled per 2/8 los, message has been left with pt

## 2021-10-24 ENCOUNTER — Other Ambulatory Visit: Payer: Self-pay

## 2021-10-24 ENCOUNTER — Inpatient Hospital Stay: Payer: BC Managed Care – PPO

## 2021-10-24 VITALS — BP 135/86 | HR 95 | Temp 98.3°F | Resp 16

## 2021-10-24 DIAGNOSIS — C182 Malignant neoplasm of ascending colon: Secondary | ICD-10-CM | POA: Diagnosis not present

## 2021-10-24 DIAGNOSIS — Z95828 Presence of other vascular implants and grafts: Secondary | ICD-10-CM

## 2021-10-24 DIAGNOSIS — C772 Secondary and unspecified malignant neoplasm of intra-abdominal lymph nodes: Secondary | ICD-10-CM

## 2021-10-24 MED ORDER — PEGFILGRASTIM-CBQV 6 MG/0.6ML ~~LOC~~ SOSY
6.0000 mg | PREFILLED_SYRINGE | Freq: Once | SUBCUTANEOUS | Status: AC
  Administered 2021-10-24: 6 mg via SUBCUTANEOUS

## 2021-10-24 MED ORDER — SODIUM CHLORIDE 0.9% FLUSH
10.0000 mL | Freq: Once | INTRAVENOUS | Status: AC
  Administered 2021-10-24: 10 mL

## 2021-10-24 MED ORDER — HEPARIN SOD (PORK) LOCK FLUSH 100 UNIT/ML IV SOLN
500.0000 [IU] | Freq: Once | INTRAVENOUS | Status: AC
  Administered 2021-10-24: 500 [IU]

## 2021-10-28 ENCOUNTER — Telehealth: Payer: Self-pay

## 2021-10-28 NOTE — Telephone Encounter (Signed)
Ricardo [name] called our office to ask about his drainage bag, Fentanyl patches, and Protonix medication.  His current drainage bag is leaking, so I went and found him a new one and left it at the front desk for him to pick up this morning. I notified Nikki, NP of his Fentanyl patches running out by Wednesday and she stated that she would send in a refill Wednesday evening. Mr. Demaria stated that he is having lots of acid reflux since his treatment that is causing him to vomit at times. Per [name], NP he can take two doses of the Protonix liquid-one in the morning and one at night. Mr. Ambroise verbalized understanding and gratitude. All questions answered. Advised to call back with any questions/concerns.

## 2021-10-30 ENCOUNTER — Telehealth: Payer: Self-pay | Admitting: *Deleted

## 2021-10-30 ENCOUNTER — Inpatient Hospital Stay (HOSPITAL_BASED_OUTPATIENT_CLINIC_OR_DEPARTMENT_OTHER): Payer: BC Managed Care – PPO | Admitting: Nurse Practitioner

## 2021-10-30 ENCOUNTER — Encounter: Payer: Self-pay | Admitting: Nurse Practitioner

## 2021-10-30 DIAGNOSIS — Z515 Encounter for palliative care: Secondary | ICD-10-CM

## 2021-10-30 DIAGNOSIS — C182 Malignant neoplasm of ascending colon: Secondary | ICD-10-CM

## 2021-10-30 DIAGNOSIS — R110 Nausea: Secondary | ICD-10-CM

## 2021-10-30 DIAGNOSIS — G893 Neoplasm related pain (acute) (chronic): Secondary | ICD-10-CM

## 2021-10-30 DIAGNOSIS — C772 Secondary and unspecified malignant neoplasm of intra-abdominal lymph nodes: Secondary | ICD-10-CM

## 2021-10-30 MED ORDER — FENTANYL 50 MCG/HR TD PT72: 1.0000 | MEDICATED_PATCH | TRANSDERMAL | 0 refills | Status: DC

## 2021-10-30 MED ORDER — FENTANYL 100 MCG/HR TD PT72
2.0000 | MEDICATED_PATCH | TRANSDERMAL | 0 refills | Status: DC
Start: 2021-10-30 — End: 2021-11-26

## 2021-10-30 NOTE — Progress Notes (Signed)
[name]  Telephone:(336) [phone] Fax:(336) [phone]   Name: Ricardo [name] Date: 10/30/2021 MRN: [id number]  DOB: Jun 08, 1966  Patient Care Team: [name], MD as PCP - General (Internal Medicine) [name], MD as Consulting Physician (Oncology) [name], RN (Inactive) as Oncology Nurse Navigator Pickenpack-Cousar, [name], NP as Nurse Practitioner (Nurse Practitioner)    I connected with Ricardo [name] on 10/30/21 at  9:00 AM EST by phone and verified that I am speaking with the correct person using two identifiers.   I discussed the limitations, risks, security and privacy concerns of performing an evaluation and management service by telemedicine and the availability of in-person appointments. I also discussed with the patient that there may be a patient responsible charge related to this service. The patient expressed understanding and agreed to proceed.   Other persons participating in the visit and their role in the encounter: N/A    Patients location: Home   Providers location: [place] HISTORY: Ricardo [name] is a 56 y.o. 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 Patient recently hospitalized 12/14 and 12/20 for SBO which was improved by conservative measures. CT of abdomen pelvis showed small bowel obstruction with transition point likely in the mid abdomen. Venting G-tube was placed prior to discharge. Ricardo [name] was discharged on 09/13/2021 and required readmission on 09/14/2021 via EMS from home with similar concerns of abdominal pain, nausea, and vomiting. Imaging showes improvement in gaseous distention, however continues to have significant G-tube output, abdominal pain, nausea, and vomiting.   SOCIAL HISTORY:     reports that he has never smoked. He has never used smokeless tobacco. He reports that he does not currently use  alcohol. He reports that he does not use drugs.    PAST MEDICAL HISTORY: Past Medical History:  Diagnosis Date   Arthritis    Class 2 obesity 09/14/2021   Colon cancer ([place])    with metastasis   Family history of adverse reaction to anesthesia    mother had problem with it due to her asthma   Family history of breast cancer    Family history of prostate cancer    GAD (generalized anxiety disorder) 09/03/2021   GERD (gastroesophageal reflux disease) 09/03/2021   History of COVID-19 04/30/2021   Formatting of this note might be different from the original. 01/2021, asymptomatic   Hyperlipidemia 11/27/2020   Pleural effusion, left 09/06/2021   Sleep disorder 11/10/2018    ALLERGIES:  is allergic to oxaliplatin.  MEDICATIONS:  Current Outpatient Medications  Medication Sig Dispense Refill   dexamethasone (DECADRON) 4 MG tablet Take 1 tablet (4 mg total) by mouth 2 (two) times daily. 60 tablet 0   DULoxetine (CYMBALTA) 30 MG capsule Take 1 capsule (30 mg total) by mouth daily. 60 capsule 0   fentaNYL (DURAGESIC) 100 MCG/HR Place 2 patches onto the skin every 3 (three) days. Along with 50 MCG/HR patch for a total of 250 MCG/HR dose. 20 patch 0   fentaNYL (DURAGESIC) 50 MCG/HR Place 1 patch onto the skin every 3 (three) days. Along with 2 of 100 MCG/HR patch for a total of 250 MCG/HR dose. 10 patch 0   HYDROmorphone (DILAUDID) 4 MG tablet Take 2 tablets (8 mg total) by mouth every 3 (three) hours as needed for severe pain or moderate pain. Take 2 tablets (8mg ) every 3-4 hours as needed for  breakthrough pain. 44 tablet 0   HYDROmorphone (DILAUDID) 4 MG tablet Take 2 tablets (8 mg total) by mouth every 4 (four) hours as needed for severe pain (Take 2 tablets (8mg ) every 3-4 hourse as needed for severe pain.). 136 tablet 0   LORazepam (ATIVAN) 0.5 MG tablet Take 1 tablet (0.5 mg total) by mouth every 6 (six) hours as needed for anxiety. 90 tablet 0   naloxone (NARCAN) nasal spray 4 mg/0.1 mL Use  1 spray for opioid overdose (Patient taking differently: 0.4 mg once as needed (opioid overdose).) 2 each 1   Nutritional Supplements (FEEDING SUPPLEMENT, BOOST BREEZE,) LIQD Take 1 each by mouth 3 (three) times daily before meals. 10000 mL 0   pantoprazole sodium (PROTONIX) 40 mg Place 40 mg into feeding tube daily. 30 packet 1   polyethylene glycol (MIRALAX / GLYCOLAX) 17 g packet Take 17 g by mouth daily as needed for mild constipation.     prochlorperazine (COMPAZINE) 10 MG tablet Take 1 tablet (10 mg total) by mouth every 6 (six) hours as needed for nausea or vomiting. 30 tablet 2   Current Facility-Administered Medications  Medication Dose Route Frequency Provider Last Rate Last Admin   0.9 %  sodium chloride infusion   Intravenous PRN Causey, Charlestine Massed, NP        VITAL SIGNS: There were no vitals taken for this visit. There were no vitals filed for this visit.   Estimated body mass index is 33.44 kg/m as calculated from the following:   Height as of 10/17/21: 5\' 11"  (1.803 m).   Weight as of 10/22/21: 239 lb 12 oz (108.7 kg).   PERFORMANCE STATUS (ECOG) : 1 - Symptomatic but completely ambulatory   Physical Exam General: NAD, much improved  Neurological: AAOx3  IMPRESSION:  I connected with Ricardo [name] by phone today. He is doing great. Has returned to work and reports symptoms are well managed. He is emptying G-tube drainage bag as needed. He is appreciative of his ongoing improvement compared to previous months. States he did take several days to "bounce back" from his last treatment. He would generally start to feel better in 2-3 days however this did not happen until around day 4.   No new concerns.   Abdominal pain related to neoplasm Feels pain is better controlled. States he must follow regimen around the clock and stay ahead of his pain and discomfort.  Current regimen: 200 mcg of fentanyl patch which was increased during recent admission from 150mcg.  Patient is  aware the goal is to hopefully gain a better sustainable pain control without treating symptoms consistently with breakthrough medications. Dilaudid 8 mg every 3-4 hours as needed for breakthrough pain. This was refilled on yesterday however due to limited supply patient will return to outpatient pharmacy for remainder of pills once the order arrives. Verbalized understanding.  Cymbalta 30 mg daily               2.    Nausea/vomiting Nausea and vomiting well controlled. Takes medication as needed.  Compazine 10 mg as needed for nausea Pantoprazole 40 mg daily.   Decadron 4mg  twice daily  Octreotide has been added to his daily TPN in the home     3. Anxiety Ativan 0.5mg  every 6 hours as needed for nausea and anxiety.   4.  Constipation Patient reports he is having daily bowel movements.  Sometimes 2-3 times per day which is an improvement.   PLAN: No adjustments to current  regimen. Refills recently sent in to pharmacy for pick-up.  He has been able to return to work on a limited basis which he is appreciative of.  Will order supply of G-tube drainage bags.  Ongoing support and close evaluation of effective symptom management.  Goal is to keep him out of the hospital and improve quality of life.  I will plan to see patient back in the clinic 2-3 weeks with future oncology appointments.   Patient expressed understanding and was in agreement with this plan. He also understands that He can call the clinic at any time with any questions, concerns, or complaints.   Time Total: 20 min.   Visit consisted of counseling and education dealing with the complex and emotionally intense issues of symptom management and palliative care in the setting of serious and potentially life-threatening illness.Greater than 50%  of this time was spent counseling and coordinating care related to the above assessment and plan.   Signed by: Alda Lea, AGPCNP-BC [facility]

## 2021-10-30 NOTE — Addendum Note (Signed)
Addended by: Jimmy Footman on: 10/30/2021 10:39 AM   Modules accepted: Orders

## 2021-10-30 NOTE — Telephone Encounter (Signed)
[id number] FMLA form  successfully faxed to [place] [phone]) at 12:29 pm.  Original copy mailed to patient address on file. [place] [phone]

## 2021-10-31 ENCOUNTER — Other Ambulatory Visit: Payer: Self-pay | Admitting: Nurse Practitioner

## 2021-10-31 ENCOUNTER — Telehealth: Payer: Self-pay

## 2021-10-31 ENCOUNTER — Encounter: Payer: Self-pay | Admitting: Hematology

## 2021-10-31 ENCOUNTER — Other Ambulatory Visit (HOSPITAL_COMMUNITY): Payer: Self-pay

## 2021-10-31 MED ORDER — FENTANYL 50 MCG/HR TD PT72
1.0000 | MEDICATED_PATCH | TRANSDERMAL | 0 refills | Status: DC
  Filled 2021-10-31: qty 10, 30d supply, fill #0

## 2021-10-31 NOTE — Telephone Encounter (Signed)
Mr. Poblete called our office to inform us that his pharmacy did not have the 58mcg Fentanyl patches. I called his pharmacy and they stated they wouldn't have the medication in until Monday. I asked them to cancel the order and [name], NP sent his prescription to [facility]. I called Mr. Gause but he didn't answer the phone, so I left him a voicemail to pick it up at this pharmacy instead. Advised to call the after hours number if there were any problems.

## 2021-10-31 NOTE — Telephone Encounter (Signed)
Patient's pharmacy called and they do not have fentanyl 55mcg patches available. Request to send to a different pharmacy. RX sent to [facility] outpatient pharmacy.

## 2021-11-03 ENCOUNTER — Other Ambulatory Visit: Payer: Self-pay

## 2021-11-03 ENCOUNTER — Telehealth: Payer: Self-pay

## 2021-11-03 ENCOUNTER — Inpatient Hospital Stay: Payer: BC Managed Care – PPO

## 2021-11-03 VITALS — BP 127/90 | HR 105 | Temp 97.6°F | Resp 20 | Ht 71.0 in | Wt 228.6 lb

## 2021-11-03 DIAGNOSIS — Z789 Other specified health status: Secondary | ICD-10-CM

## 2021-11-03 DIAGNOSIS — C189 Malignant neoplasm of colon, unspecified: Secondary | ICD-10-CM

## 2021-11-03 DIAGNOSIS — C182 Malignant neoplasm of ascending colon: Secondary | ICD-10-CM | POA: Diagnosis not present

## 2021-11-03 NOTE — Progress Notes (Signed)
I called and spoke with Bethanne Ginger from [place] [facility] to figure out a plan for Mr. Rollison's drainage bags. Verbal order placed for DME other-Foley Catheter drainage bag.

## 2021-11-03 NOTE — Progress Notes (Signed)
Pt arrived with occlusive dressing intact and additional re-enforcement dressing in place.  PICC catheter tip is dangling from pt's arm outside of all of the dressings.  Removed occlusive dressings and dawned sterile gloves to clean site with Alcohol & CHG.  Allowed site to thoroughly dry and applied vasoline, sterile gauze, occlusive dressing applied to site.  Educated pt and spouse on how to care for old PICC site.  Accessed pt's rt PAC so he can run his TPN.  Verbal order from Dr. Burr Medico to replace PICC prior to next chemo.

## 2021-11-03 NOTE — Telephone Encounter (Signed)
Received voicemail message from [facility] health stating that the pt's PICC line came out last night when the pt was asleep.  Pt stated he needs to know what to do since he does TPN and his Port is not accessed.  LeLe wanted orders to access pt's Port or to find out if Dr. Burr Medico could see the pt to have new PICC replaced.  Contacted pt via telephone to find out if the PICC is completely out of if pt slightly pulled PICC.  Pt stated that PICC got hung onto something while sleeping and is not sure if PICC is completely out or not.  Pt stated the PICC is bleeding at the insertion site.  Instructed pt to apply pressure to the insertion site to help stop the bleeding.  Scheduled pt to come in to Dr. Ernestina Penna office so the PICC can be further assessed.

## 2021-11-07 ENCOUNTER — Encounter: Payer: Self-pay | Admitting: Hematology

## 2021-11-10 ENCOUNTER — Ambulatory Visit (HOSPITAL_COMMUNITY)
Admission: RE | Admit: 2021-11-10 | Discharge: 2021-11-10 | Disposition: A | Discharge status: Home / Self Care | Payer: BC Managed Care – PPO | Source: Ambulatory Visit | Attending: Hematology | Admitting: Hematology

## 2021-11-10 ENCOUNTER — Ambulatory Visit (HOSPITAL_BASED_OUTPATIENT_CLINIC_OR_DEPARTMENT_OTHER)
Admission: RE | Admit: 2021-11-10 | Discharge: 2021-11-10 | Disposition: A | Discharge status: Home / Self Care | Payer: BC Managed Care – PPO | Source: Ambulatory Visit | Attending: Hematology | Admitting: Hematology

## 2021-11-10 ENCOUNTER — Other Ambulatory Visit (HOSPITAL_COMMUNITY): Payer: Self-pay

## 2021-11-10 ENCOUNTER — Other Ambulatory Visit: Payer: Self-pay

## 2021-11-10 ENCOUNTER — Inpatient Hospital Stay (HOSPITAL_BASED_OUTPATIENT_CLINIC_OR_DEPARTMENT_OTHER): Payer: BC Managed Care – PPO | Admitting: Hematology

## 2021-11-10 ENCOUNTER — Inpatient Hospital Stay: Payer: BC Managed Care – PPO

## 2021-11-10 ENCOUNTER — Other Ambulatory Visit: Payer: Self-pay | Admitting: Hematology

## 2021-11-10 VITALS — BP 131/85 | HR 76 | Temp 97.7°F | Resp 18 | Ht 71.0 in | Wt 223.3 lb

## 2021-11-10 DIAGNOSIS — Z789 Other specified health status: Secondary | ICD-10-CM

## 2021-11-10 DIAGNOSIS — C182 Malignant neoplasm of ascending colon: Secondary | ICD-10-CM | POA: Diagnosis not present

## 2021-11-10 DIAGNOSIS — C189 Malignant neoplasm of colon, unspecified: Secondary | ICD-10-CM | POA: Insufficient documentation

## 2021-11-10 DIAGNOSIS — C772 Secondary and unspecified malignant neoplasm of intra-abdominal lymph nodes: Secondary | ICD-10-CM

## 2021-11-10 DIAGNOSIS — I82622 Acute embolism and thrombosis of deep veins of left upper extremity: Secondary | ICD-10-CM | POA: Insufficient documentation

## 2021-11-10 HISTORY — PX: IR FLUORO GUIDE CV LINE LEFT: IMG2282

## 2021-11-10 HISTORY — PX: IR US GUIDE VASC ACCESS LEFT: IMG2389

## 2021-11-10 LAB — CBC WITH DIFFERENTIAL (CANCER CENTER ONLY)
Abs Immature Granulocytes: 0.97 10*3/uL — ABNORMAL HIGH (ref 0.00–0.07)
Basophils Absolute: 0.1 10*3/uL (ref 0.0–0.1)
Basophils Relative: 0 %
Eosinophils Absolute: 0 10*3/uL (ref 0.0–0.5)
Eosinophils Relative: 0 %
HCT: 36.6 % — ABNORMAL LOW (ref 39.0–52.0)
Hemoglobin: 11.9 g/dL — ABNORMAL LOW (ref 13.0–17.0)
Immature Granulocytes: 6 %
Lymphocytes Relative: 11 %
Lymphs Abs: 1.8 10*3/uL (ref 0.7–4.0)
MCH: 27.9 pg (ref 26.0–34.0)
MCHC: 32.5 g/dL (ref 30.0–36.0)
MCV: 85.9 fL (ref 80.0–100.0)
Monocytes Absolute: 0.8 10*3/uL (ref 0.1–1.0)
Monocytes Relative: 5 %
Neutro Abs: 13.4 10*3/uL — ABNORMAL HIGH (ref 1.7–7.7)
Neutrophils Relative %: 78 %
Platelet Count: 270 10*3/uL (ref 150–400)
RBC: 4.26 MIL/uL (ref 4.22–5.81)
RDW: 19.9 % — ABNORMAL HIGH (ref 11.5–15.5)
WBC Count: 17 10*3/uL — ABNORMAL HIGH (ref 4.0–10.5)
nRBC: 0 % (ref 0.0–0.2)

## 2021-11-10 LAB — CMP (CANCER CENTER ONLY)
ALT: 44 U/L (ref 0–44)
AST: 33 U/L (ref 15–41)
Albumin: 3.3 g/dL — ABNORMAL LOW (ref 3.5–5.0)
Alkaline Phosphatase: 175 U/L — ABNORMAL HIGH (ref 38–126)
Anion gap: 8 (ref 5–15)
BUN: 26 mg/dL — ABNORMAL HIGH (ref 6–20)
CO2: 27 mmol/L (ref 22–32)
Calcium: 8.9 mg/dL (ref 8.9–10.3)
Chloride: 99 mmol/L (ref 98–111)
Creatinine: 0.89 mg/dL (ref 0.61–1.24)
GFR, Estimated: >60 mL/min (ref >60)
Glucose, Bld: 93 mg/dL (ref 70–99)
Potassium: 3.7 mmol/L (ref 3.5–5.1)
Sodium: 134 mmol/L — ABNORMAL LOW (ref 135–145)
Total Bilirubin: 0.6 mg/dL (ref 0.3–1.2)
Total Protein: 7.4 g/dL (ref 6.5–8.1)

## 2021-11-10 MED ORDER — LIDOCAINE HCL 1 % IJ SOLN
INTRAMUSCULAR | Status: DC | PRN
  Administered 2021-11-10: 10 mL

## 2021-11-10 MED ORDER — HEPARIN SOD (PORK) LOCK FLUSH 100 UNIT/ML IV SOLN
INTRAVENOUS | Status: DC | PRN
  Administered 2021-11-10: 500 [IU]

## 2021-11-10 MED ORDER — RIVAROXABAN (XARELTO) VTE STARTER PACK (15 & 20 MG)
ORAL_TABLET | ORAL | 0 refills | Status: DC
  Filled 2021-11-10: qty 51, 28d supply, fill #0

## 2021-11-10 MED ORDER — HEPARIN SOD (PORK) LOCK FLUSH 100 UNIT/ML IV SOLN
INTRAVENOUS | Status: AC
  Filled 2021-11-10: qty 5

## 2021-11-10 MED ORDER — LIDOCAINE HCL 1 % IJ SOLN
INTRAMUSCULAR | Status: AC
  Filled 2021-11-10: qty 20

## 2021-11-10 NOTE — Progress Notes (Signed)
Placed order for Doppler of LUE r/o DVT from PICC.  Pt is scheduled today 11/10/2021 @3pm .  Spoke with pt to confirm appt.

## 2021-11-10 NOTE — Progress Notes (Signed)
[place]   Telephone:(336) [phone] Fax:(336) [phone]   Clinic Follow up Note   Patient Care Team: Ricardo Pollen, MD as PCP - General (Internal Medicine) Ricardo Merle, MD as Consulting Physician (Oncology) Ricardo Finner, RN (Inactive) as Oncology Nurse Navigator [name], Ricardo Sax, NP as Nurse Practitioner (Nurse Practitioner)  Date of Service:  11/10/2021  CHIEF COMPLAINT: f/u of acute LUE DVT; metastatic colon cancer  CURRENT THERAPY:  First line FOLFOX q2 weeks starting 12/18/20.  -Bevacizumab added with cycle 2.  -Irinotecan added from C6 on 03/13/21.  -Oxaliplatin discontinued after C7 due to allergy reactions.  -restarted FOLFOXIRI on 09/18/2021  ASSESSMENT & PLAN:  Ricardo [name] is a 56 y.o. male with   1. LUE DVT -noted to have thrombosed LUE veins during procedure to place tunneled PICC line. -Doppler today showed acute DVT involving left subclavian, axillary, and brachial veins confirmed on doppler today (11/10/21) -he has no arm edema and pian  -this is probably PICC related (old PICC line removed last week) -Due to his underlying diffuse metastasis, he is at very high risk for thrombosis, I recommend anticoagulation with Xarelto.  Benefit and side effects, especially risk of bleeding was discussed with patient, he agrees to proceed.  2. 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 Work up showed mass in the terminal ileum with extensive omental nodularity and LN involvement. Baseline tumor marker CA 19-9 and CEA were normal. 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 This confirms stage IV metastatic cancer from cecum -He began first-line palliative chemo with FOLFOX on 12/18/20 -FO shows KRAS/NRAS wildtype, Bevacizumab was added with cycle 2 -Treatment was escalated to [facility] with cycle 6 FOLFOX on 03/13/21,  He tolerated well -He underwent laparoscopy on 05/13/21 by Dr. Clovis [name], unfortunately due to his extensive peritoneal metastasis he was not a candidate for cytoreductive surgery/HIPEC. -restaging CT 07/01/21 showed stable disease  -given recent SBO, I restarted chemotherapy FOLFOXFIRI On 09/18/21. He tolerated well but was readmitted for abdominal pain  -he recovered well from his hospitalization and restarted FOLFOXIRI on 10/22/21, he tolerated well overall  -lab reviewed, adequate for treatment, will proceed chemo FOLFOXIRI this Thursday and continue every 3 weeks -plan to repeat scan in 5-6 weeks    3. Symptom Management: Constipation, abdominal pain, small bowl obstruction  -he has been admitted several times in the last two months due to recurrent abdominal pain, episodes of nausea and vomiting, and small bowel obstruction. He required PEG placement for venting purposes. -He is on TPN, will continue  -he reports today he has still not had a bowel movement (last prior to hospitalization on 09/30/21) -f/u with clinic for pain management.  He is on high dose fentanyl patch and dilaudid    4. Genetic testing -He has brother with prostate cancer and sister with breast cancer in their 66s. He is eligible for genetic testing.  -Negative result   5. Social Support -He is an avid cyclist. Last long distant riding was 06/2020 across [place]. He has not biked in the 8-9 weeks before cancer diagnosis due to symptoms. His weight heavily fluctuates with  changes in his activity level, diet and biking. -He is married with 1 adult son. His brother Ricardo [name] is in town. He works as a Designer, television/film set at [employer]. He  may take time off work based on how to tolerate chemotherapy. -He notes he has good family support, very close with his wife and 36 year old son who currently lives in [place]    6.  Goals of care discussion, limited code  -Due to his peritoneal metastasis, he is not a candidate for cytoreductive surgery with HIPEC -He understands his cancer is metastatic, not curable, but still treatable -In the end, he does not want to suffer.  He strongly desires to have a clear head to say goodbye to his wife and son when the time comes.   -He does not want prolonged life support      PLAN: -I called in [place] starting packet to [place] today, he will start 50 mg twice daily tonight, for 3 weeks, then changed to 20 mg daily -Lab reviewed, adequate for chemotherapy on 3/2  -f/u on 3/23 before chemo, will order restaging CT on next visit -f/u with PM NP [facility] for symptom management    No problem-specific Assessment & Plan notes found for this encounter.   SUMMARY OF ONCOLOGIC HISTORY: Oncology History Overview Note  Cancer Staging metastatic cecal cancer Staging form: Colon and Rectum, AJCC 8th Edition - Clinical stage from 11/29/2020: Stage IVC (cTX, cN2, pM1c) - Signed by Ricardo Merle, MD on 12/04/2020 Stage prefix: Initial diagnosis Histologic grade (G): G3 Histologic grading system: 4 grade system    metastatic cecal cancer  11/27/2020 Imaging   CT Angio CAP  IMPRESSION: 1. Thickening of the terminal ileum with associated proximal mid to distal small bowel obstruction. Findings could be due to an ileitis versus malignancy. Nonspecific mesenteric edema could be due to engorgement versus metastases. No associated bowel perforation. Recommend endoscopy for further evaluation. 2. Indeterminate right lower quadrant lymphadenopathy. 3. Scattered colonic diverticulosis with no acute diverticulitis. 4. Stable right hepatic lobe subcentimeter hyperdensity likely represents a hepatic  hemangioma. 5. No acute vascular abnormality. Aortic Atherosclerosis (ICD10-I70.0) - mild. 6. No acute intrathoracic abnormality.   11/28/2020 Imaging   CT AP  IMPRESSION: 1. There is masslike, circumferential thickening of the terminal ileum and cecal base near the ileocecal valve and abnormally enlarged lymph nodes in the right lower quadrant mesentery adjacent to the terminal ileum measuring up to 2.3 x 1.4 cm. 2. There is extensive omental and peritoneal nodularity and caking throughout the abdomen. 3. Findings are highly concerning for primary colon malignancy with nodal and peritoneal metastatic disease. 4. Small volume perihepatic and perisplenic ascites. 5. The small bowel is generally decompressed, with full some fluid-filled, nondistended loops throughout. There is transit of oral enteric contrast to the terminal ileum. No evidence of overt bowel obstruction at this time. Esophagogastric tube is position with tip and side port below the diaphragm. 6. Atelectasis or consolidation of the dependent bilateral lung bases, new compared to prior examination.   Aortic Atherosclerosis (ICD10-I70.0).     11/29/2020 Surgery   EXPLORATORY LAPAROTOMY, PARTIAL BOWEL RESECTION, POSSIBLE OSTOMY CREATION, PERITIONEAL BIOPSY, INSERTION OF GASTROSTOMY TUBE by Dr Ricardo [name]   11/29/2020 Initial Biopsy   FINAL MICROSCOPIC DIAGNOSIS:   AB. 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    COMMENT:   Immunohistochemistry (IHC) for CK20 and CDX-2 is strong and diffusely  positive.  CK7, TTF-1, Synaptophysin,  Chromogranin and CD56 are  negative.  The immunophenotype is compatible with origin from the lower  gastrointestinal tract.  IHC for MMR will be reported separately.  Case  preliminarily discussed with Dr. [name] on 12/02/2020.   At the request of Dr. [name], [phone] was reviewed in retrospect.   Review of the submitted sections confirms the presence of acute  appendicitis.  No malignancy is identified.    11/29/2020 Cancer Staging   Staging form: Colon and Rectum, AJCC 8th Edition - Clinical stage from 11/29/2020: Stage IVC (cTX, cN2, pM1c) - Signed by Ricardo Merle, MD on 12/04/2020 Stage prefix: Initial diagnosis Histologic grade (G): G3 Histologic grading system: 4 grade system    12/04/2020 Initial Diagnosis   Cancer of ascending colon metastatic to intra-abdominal lymph node ([place])    Chemotherapy   FOLFOX q2weeks    12/18/2020 - 02/15/2021 Chemotherapy      Patient is on Antibody Plan: COLORECTAL BEVACIZUMAB Q14D     01/03/2021 Imaging   CT A/P IMPRESSION: 1. Wall thickening of the terminal ileum again seen. Fluid-filled distal small bowel, ascending, and transverse colon without obstruction. 2. Equivocal improvement in omental and peritoneal metastatic disease with slightly improved small volume perihepatic and perisplenic ascites. Right lower quadrant mesenteric adenopathy is similar or mildly improved. 3. Sigmoid colonic diverticulosis. Mild mural wall thickening in the sigmoid, no acute diverticulitis. 4. Gastrostomy tube in place, balloon normally positioned in the stomach. 5. Chronic bilateral L5 pars interarticularis defects with trace anterolisthesis of L5 on S1. Chronic avascular necrosis of the left femoral head.   01/16/2021 Genetic Testing   Negative genetic testing. CTNNA1 [id number] VUS, RAD51D p.G140E VUS and TSC1 p.R768H VUS found on the CancerNext-Expanded+RNAinsight.  The CancerNext-Expanded gene panel offered by [facility] and includes sequencing and rearrangement analysis for the following 77 genes: AIP, ALK, APC*, ATM*, AXIN2, BAP1, BARD1, BLM, BMPR1A, BRCA1*, BRCA2*, BRIP1*, CDC73, CDH1*, CDK4, CDKN1B, CDKN2A, CHEK2*, CTNNA1, DICER1, FANCC, FH, FLCN, GALNT12, KIF1B, LZTR1, MAX, MEN1, MET, MLH1*, MSH2*, MSH3, MSH6*, MUTYH*, NBN, NF1*, NF2,  NTHL1, PALB2*, PHOX2B, PMS2*, POT1, PRKAR1A, PTCH1, PTEN*, RAD51C*, RAD51D*, RB1, RECQL, RET, SDHA, SDHAF2, SDHB, SDHC, SDHD, SMAD4, SMARCA4, SMARCB1, SMARCE1, STK11, SUFU, TMEM127, TP53*, TSC1, TSC2, VHL and XRCC2 (sequencing and deletion/duplication); EGFR, EGLN1, HOXB13, KIT, MITF, PDGFRA, POLD1, and POLE (sequencing only); EPCAM and GREM1 (deletion/duplication only). DNA and RNA analyses performed for * genes. The report date is Jan 16, 2021.   02/24/2021 Imaging   CT AP  IMPRESSION: 1. Interval resolution of previously seen small volume ascites throughout the abdomen and pelvis. There is redemonstrated, ill-defined stranding and thickening of the peritoneal surfaces and omentum, which is somewhat diminished compared to prior examination. 2. Interval improvement in right lower quadrant mesocolon lymph nodes. 3. Findings are consistent with treatment response of nodal and peritoneal metastatic disease. 4. Unchanged, matted appearing thickening of the terminal ileum and cecal base. 5. Sigmoid diverticulosis with wall thickening of the mid to distal sigmoid, similar to prior examination. Findings are most suggestive of chronic sequelae of diverticulitis.   Aortic Atherosclerosis (ICD10-I70.0).   03/13/2021 -  Chemotherapy   Patient is on Treatment Plan : COLORECTAL FOLFOXIRI + Bevacizumab q14d     05/13/2021 Procedure   OPERATIVE PROCEDURE: Laparoscopy and peritoneal biopsy.  SURGEON: Merlyn Albert. Ricardo Riley, MD   05/13/2021 Pathology Results   Final Pathologic Diagnosis    A. PERITONEAL, BIOPSY :              Metastatic adenocarcinoma.  See comment.    Comment    The biopsies show predominately fibrosis  and fibroadipose tissue with a small focus of moderately differentiated adenocarcinoma. Given the patient's history, the findings favor metastasis of the patient's known colonic cancer.     07/01/2021 Imaging   CT CAP  IMPRESSION: Chest Impression:   1. No evidence of  thoracic metastasis.   Abdomen / Pelvis Impression:   1. Thickening through the terminal ileum similar to prior. No ileocecal lymphadenopathy. 2. One focus linear thickening the peritoneum adjacent to loops of small bowel in the ventral abdomen which extend to the proximal sigmoid colon are more prominent than prior and concerning for residual peritoneal carcinoma. 3. No evidence of solid organ metastasis in the abdomen pelvis.      INTERVAL HISTORY:  Ricardo [name] is here for a follow up of acute DVT. He was last seen by me on 10/22/21. He presents to the clinic with his wife. He went to IR today for PICC line replacement (the old PICC line was removed last Friday due to mispositioned). He was found to have left UE DVT during the procedure.  He had a Doppler which confirmed extensive DVT in the left subclavicular, axillary, and brachial veins. He came in to discuss anticoagulation. He is scheduled for chemotherapy again later this week.  He tolerated last cycle moderate well, did have fatigue, mild nausea for several days, improved subsequently.  His pain is overall controlled, he is tolerating TPN well.  No fever, chills, or other signs of infection.  He empties his venting PEG 4-5 times a day. He worked 3 half days in past 3 weeks. He has been able to function at home.    All other systems were reviewed with the patient and are negative.  MEDICAL HISTORY:  Past Medical History:  Diagnosis Date   Arthritis    Class 2 obesity 09/14/2021   Colon cancer ([place])    with metastasis   Family history of adverse reaction to anesthesia    mother had problem with it due to her asthma   Family history of breast cancer    Family history of prostate cancer    GAD (generalized anxiety disorder) 09/03/2021   GERD (gastroesophageal reflux disease) 09/03/2021   History of COVID-19 04/30/2021   Formatting of this note might be different from the original. 01/2021, asymptomatic   Hyperlipidemia 11/27/2020    Pleural effusion, left 09/06/2021   Sleep disorder 11/10/2018    SURGICAL HISTORY: Past Surgical History:  Procedure Laterality Date   BOWEL RESECTION N/A 11/29/2020   Procedure: PARTIAL BOWEL RESECTION;  Surgeon: Jovita Kussmaul, MD;  Location: [place];  Service: General;  Laterality: N/A;   GASTROSTOMY Left 11/29/2020   Procedure: INSERTION OF GASTROSTOMY TUBE;  Surgeon: Jovita Kussmaul, MD;  Location: [place] [facility];  Service: General;  Laterality: Left;   IR FLUORO GUIDE CV LINE LEFT  11/10/2021   IR GASTROSTOMY TUBE MOD SED  09/11/2021   IR IMAGING GUIDED PORT INSERTION  12/12/2020   IR US GUIDE VASC ACCESS LEFT  11/10/2021   LAPAROSCOPIC APPENDECTOMY N/A 01/17/2019   Procedure: APPENDECTOMY LAPAROSCOPIC;  Surgeon: Ralene Ok, MD;  Location: [place];  Service: General;  Laterality: N/A;   LAPAROTOMY N/A 11/29/2020   Procedure: EXPLORATORY LAPAROTOMY;  Surgeon: Jovita Kussmaul, MD;  Location: [place];  Service: General;  Laterality: N/A;  PUT CASE IN ROOM 1 STARTING AT 9:30AM FOR 120 MIN   OSTOMY N/A 11/29/2020  Procedure: POSSIBLE OSTOMY CREATION;  Surgeon: Jovita Kussmaul, MD;  Location: [place];  Service: General;  Laterality: N/A;   RECTAL BIOPSY N/A 11/29/2020   Procedure: PERITIONEAL BIOPSY;  Surgeon: Jovita Kussmaul, MD;  Location: [place];  Service: General;  Laterality: N/A;   SHOULDER SURGERY Left 09/19/2015    I have reviewed the social history and family history with the patient and they are unchanged from previous note.  ALLERGIES:  is allergic to oxaliplatin.  MEDICATIONS:  Current Outpatient Medications  Medication Sig Dispense Refill   RIVAROXABAN (XARELTO) VTE STARTER PACK (15 & 20 MG) Follow package directions: Take one 63m tablet by mouth twice a day. On day 22, switch to one 2433mtablet once a day. Take with food. 51 each 0   dexamethasone (DECADRON) 4 MG tablet Take 1 tablet (4 mg total) by mouth 2 (two) times daily. 60 tablet 0   DULoxetine (CYMBALTA) 30 MG capsule Take 1 capsule  (30 mg total) by mouth daily. 60 capsule 0   fentaNYL (DURAGESIC) 100 MCG/HR Place 2 patches onto the skin every 3 (three) days. Along with 50 MCG/HR patch for a total of 250 MCG/HR dose. 20 patch 0   fentaNYL (DURAGESIC) 50 MCG/HR Place 1 patch onto the skin every 3 (three) days. Along with 2 of 100 MCG/HR patch for a total of 250 MCG/HR dose. 10 patch 0   HYDROmorphone (DILAUDID) 4 MG tablet Take 2 tablets (8 mg total) by mouth every 3 (three) hours as needed for severe pain or moderate pain. Take 2 tablets (33m3mevery 3-4 hours as needed for breakthrough pain. 44 tablet 0   HYDROmorphone (DILAUDID) 4 MG tablet Take 2 tablets (8 mg total) by mouth every 4 (four) hours as needed for severe pain (Take 2 tablets (33mg24mvery 3-4 hourse as needed for severe pain.). 136 tablet 0   LORazepam (ATIVAN) 0.5 MG tablet Take 1 tablet (0.5 mg total) by mouth every 6 (six) hours as needed for anxiety. 90 tablet 0   naloxone (NARCAN) nasal spray 4 mg/0.1 mL Use 1 spray for opioid overdose (Patient taking differently: 0.4 mg once as needed (opioid overdose).) 2 each 1   Nutritional Supplements (FEEDING SUPPLEMENT, BOOST BREEZE,) LIQD Take 1 each by mouth 3 (three) times daily before meals. 10000 mL 0   pantoprazole sodium (PROTONIX) 40 mg Place 40 mg into feeding tube daily. 30 packet 1   polyethylene glycol (MIRALAX / GLYCOLAX) 17 g packet Take 17 g by mouth daily as needed for mild constipation.     prochlorperazine (COMPAZINE) 10 MG tablet Take 1 tablet (10 mg total) by mouth every 6 (six) hours as needed for nausea or vomiting. 30 tablet 2   Current Facility-Administered Medications  Medication Dose Route Frequency Provider Last Rate Last Admin   0.9 %  sodium chloride infusion   Intravenous PRN Causey, LindCharlestine Massed       Facility-Administered Medications Ordered in Other Visits  Medication Dose Route Frequency Provider Last Rate Last Admin   heparin lock flush 100 UNIT/ML injection             heparin lock flush 100 unit/mL    PRN Mugweru, Jon, MD   500 Units at 11/10/21 1421   lidocaine (XYLOCAINE) 1 % (with pres) injection            lidocaine (XYLOCAINE) 1 % (with pres) injection    PRN MugwMichaelle Birks   10 mL at 11/10/21 1359  PHYSICAL EXAMINATION: ECOG PERFORMANCE STATUS: 2 - Symptomatic, <50% confined to bed  Vitals:   11/10/21 1552  BP: 131/85  Pulse: 76  Resp: 18  Temp: 97.7 F (36.5 C)  SpO2: 97%   Wt Readings from Last 3 Encounters:  11/10/21 223 lb 4.8 oz (101.3 kg)  11/03/21 228 lb 9.6 oz (103.7 kg)  10/22/21 239 lb 12 oz (108.7 kg)     GENERAL:alert, no distress and comfortable SKIN: skin color, texture, turgor are normal, no rashes or significant lesions EYES: normal, Conjunctiva are pink and non-injected, sclera clear Musculoskeletal:no cyanosis of digits and no clubbing, no edema or skin changes in left UE  NEURO: alert & oriented x 3 with fluent speech, no focal motor/sensory deficits  LABORATORY DATA:  I have reviewed the data as listed CBC Latest Ref Rng & Units 11/10/2021 10/22/2021 10/17/2021  WBC 4.0 - 10.5 K/uL 17.0(H) 9.4 14.0(H)  Hemoglobin 13.0 - 17.0 g/dL 11.9(L) 11.1(L) 13.1  Hematocrit 39.0 - 52.0 % 36.6(L) 34.4(L) 40.0  Platelets 150 - 400 K/uL 270 186 269     CMP Latest Ref Rng & Units 11/10/2021 10/22/2021 10/17/2021  Glucose 70 - 99 mg/dL 93 81 115(H)  BUN 6 - 20 mg/dL 26(H) 25(H) 25(H)  Creatinine 0.61 - 1.24 mg/dL 0.89 0.77 0.85  Sodium 135 - 145 mmol/L 134(L) 136 137  Potassium 3.5 - 5.1 mmol/L 3.7 4.3 4.0  Chloride 98 - 111 mmol/L 99 101 99  CO2 22 - 32 mmol/L 27 29 33(H)  Calcium 8.9 - 10.3 mg/dL 8.9 8.1(L) 8.6(L)  Total Protein 6.5 - 8.1 g/dL 7.4 6.2(L) 7.1  Total Bilirubin 0.3 - 1.2 mg/dL 0.6 0.7 0.8  Alkaline Phos 38 - 126 U/L 175(H) 96 134(H)  AST 15 - 41 U/L 33 16 17  ALT 0 - 44 U/L 44 16 24      RADIOGRAPHIC STUDIES: I have personally reviewed the radiological images as listed and agreed with the findings in the  report. IR Fluoro Guide CV Line Left  Result Date: 11/10/2021 INDICATION: TPN dependent.  Multiple prior LEFT upper extremity PICCs. EXAM: ULTRASOUND AND FLUOROSCOPIC GUIDED PLACEMENT OF TUNNELED "POWERLINE" CENTRAL VENOUS CATHETER MEDICATIONS: None. ANESTHESIA/SEDATION: Local anesthetic was employed during this procedure. FLUOROSCOPY TIME:  6 minutes, 6 seconds.  (26 MGy) COMPLICATIONS: None immediate. PROCEDURE: Informed written consent was obtained from the patient and/or patient's representative after a discussion of the risks, benefits, and alternatives to treatment. Questions regarding the procedure were encouraged and answered. The LEFT neck and chest were prepped with chlorhexidine in a sterile fashion, and a sterile drape was applied covering the operative field. Maximum barrier sterile technique with sterile gowns and gloves were used for the procedure. A timeout was performed prior to the initiation of the procedure. After the overlying soft tissues were anesthetized, a small venotomy incision was created and a micropuncture kit was utilized to access the internal jugular vein. Real-time ultrasound guidance was utilized for vascular access including the acquisition of a permanent ultrasound image documenting patency of the accessed vessel. The microwire was utilized to measure appropriate catheter length. The micropuncture sheath was exchanged for a peel-away sheath over a guidewire. A 5 [place] dual lumen tunneled central venous catheter was tunneled in a retrograde fashion from the anterior chest wall to the venotomy incision. The catheter was then placed through the peel-away sheath with tip ultimately positioned at the superior caval-atrial junction. Final catheter positioning was confirmed and documented with a spot radiographic image. The catheter aspirates  and flushes normally. The catheter was flushed with appropriate volume heparin dwells. The catheter exit site was secured with a 3-0 Ethilon  retention suture. The venotomy incision was closed with Dermabond. Dressings were applied. The patient tolerated the procedure well without immediate post procedural complication. FINDINGS: 1. Preprocedure ultrasound significant for diminutive LEFT upper extremity basilic vein, and thrombosis of the brachial and axillary veins. 2. After catheter placement, the tip lies within the proximal RIGHT atrium the catheter aspirates and flushes normally and is ready for immediate use. IMPRESSION: 1. Successful placement of dual lumen tunneled "Powerline" central venous catheter via the LEFT internal jugular vein. The tip of the catheter is positioned within the proximal RIGHT atrium. The catheter is ready for immediate use. 2. LEFT upper extremity DVT, with thrombosis of the visualized portions of the brachial and axillary veins. Michaelle Birks, MD [facility] Radiology Electronically Signed   By: Michaelle Birks M.D.   On: 11/10/2021 15:47   IR US Guide Vasc Access Left  Result Date: 11/10/2021 INDICATION: TPN dependent.  Multiple prior LEFT upper extremity PICCs. EXAM: ULTRASOUND AND FLUOROSCOPIC GUIDED PLACEMENT OF TUNNELED "POWERLINE" CENTRAL VENOUS CATHETER MEDICATIONS: None. ANESTHESIA/SEDATION: Local anesthetic was employed during this procedure. FLUOROSCOPY TIME:  6 minutes, 6 seconds.  (26 MGy) COMPLICATIONS: None immediate. PROCEDURE: Informed written consent was obtained from the patient and/or patient's representative after a discussion of the risks, benefits, and alternatives to treatment. Questions regarding the procedure were encouraged and answered. The LEFT neck and chest were prepped with chlorhexidine in a sterile fashion, and a sterile drape was applied covering the operative field. Maximum barrier sterile technique with sterile gowns and gloves were used for the procedure. A timeout was performed prior to the initiation of the procedure. After the overlying  soft tissues were anesthetized, a small venotomy incision was created and a micropuncture kit was utilized to access the internal jugular vein. Real-time ultrasound guidance was utilized for vascular access including the acquisition of a permanent ultrasound image documenting patency of the accessed vessel. The microwire was utilized to measure appropriate catheter length. The micropuncture sheath was exchanged for a peel-away sheath over a guidewire. A 5 [place] dual lumen tunneled central venous catheter was tunneled in a retrograde fashion from the anterior chest wall to the venotomy incision. The catheter was then placed through the peel-away sheath with tip ultimately positioned at the superior caval-atrial junction. Final catheter positioning was confirmed and documented with a spot radiographic image. The catheter aspirates and flushes normally. The catheter was flushed with appropriate volume heparin dwells. The catheter exit site was secured with a 3-0 Ethilon retention suture. The venotomy incision was closed with Dermabond. Dressings were applied. The patient tolerated the procedure well without immediate post procedural complication. FINDINGS: 1. Preprocedure ultrasound significant for diminutive LEFT upper extremity basilic vein, and thrombosis of the brachial and axillary veins. 2. After catheter placement, the tip lies within the proximal RIGHT atrium the catheter aspirates and flushes normally and is ready for immediate use. IMPRESSION: 1. Successful placement of dual lumen tunneled "Powerline" central venous catheter via the LEFT internal jugular vein. The tip of the catheter is positioned within the proximal RIGHT atrium. The catheter is ready for immediate use. 2. LEFT upper extremity DVT, with thrombosis of the visualized portions of the brachial and axillary veins. Michaelle Birks, MD [facility] Radiology Electronically Signed   By: Michaelle Birks M.D.    On: 11/10/2021 15:47  VAS [place] UPPER EXTREMITY VENOUS DUPLEX  Result Date: 11/10/2021 UPPER VENOUS STUDY  Patient Name:  Ricardo [name]  Date of Exam:   11/10/2021 Medical Rec #: [id number]        Accession #:    [id number] Date of Birth: 1965-12-17         Patient Gender: M Patient Age:   20 years Exam Location:  [facility] Procedure:      VAS [place] UPPER EXTREMITY VENOUS DUPLEX Referring Phys: Ricardo [name] --------------------------------------------------------------------------------  Indications: Left upper extremity DVT seen during PICC insertion Comparison Study: No prior study Performing Technologist: Maudry Mayhew MHA, RDMS, RVT, RDCS  Examination Guidelines: A complete evaluation includes B-mode imaging, spectral Doppler, color Doppler, and power Doppler as needed of all accessible portions of each vessel. Bilateral testing is considered an integral part of a complete examination. Limited examinations for reoccurring indications may be performed as noted.  Right Findings: +----------+------------+---------+-----------+----------+-------+  RIGHT      Compressible Phasicity Spontaneous Properties Summary  +----------+------------+---------+-----------+----------+-------+  Subclavian                 Yes        Yes                         +----------+------------+---------+-----------+----------+-------+  Left Findings: +----------+------------+---------+-----------+----------+-------+  LEFT       Compressible Phasicity Spontaneous Properties Summary  +----------+------------+---------+-----------+----------+-------+  IJV            Full        Yes        Yes                         +----------+------------+---------+-----------+----------+-------+  Subclavian     None                   No                  Acute   +----------+------------+---------+-----------+----------+-------+  Axillary       None                   No                  Acute    +----------+------------+---------+-----------+----------+-------+  Brachial       None                   No                  Acute   +----------+------------+---------+-----------+----------+-------+  Radial         Full                                               +----------+------------+---------+-----------+----------+-------+  Ulnar          Full                                               +----------+------------+---------+-----------+----------+-------+  Cephalic       Full                                               +----------+------------+---------+-----------+----------+-------+  Basilic        Full                                               +----------+------------+---------+-----------+----------+-------+  Summary:  Right: No evidence of thrombosis in the subclavian.  Left: Findings consistent with acute deep vein thrombosis involving the left subclavian vein, left axillary vein and left brachial veins.  *See table(s) above for measurements and observations.    Preliminary       No orders of the defined types were placed in this encounter.  All questions were answered. The patient knows to call the clinic with any problems, questions or concerns. No barriers to learning was detected. The total time spent in the appointment was 30 minutes.     Ricardo Merle, MD 11/10/2021   I, Wilburn Mylar, am acting as scribe for Ricardo Merle, MD.   I have reviewed the above documentation for accuracy and completeness, and I agree with the above.

## 2021-11-10 NOTE — Procedures (Signed)
Vascular and Interventional Radiology Procedure Note  Patient: LEWIS KEATS DOB: October 23, 1965 Medical Record Number: [id number] Note Date/Time: 11/10/21 3:26 PM   Performing Physician: Michaelle Birks, MD Assistant(s): None  Diagnosis: TPN access  Procedure: TUNNELED POWERLINE CATHETER PLACEMENT  Anesthesia: Local Anesthetic Complications: None Estimated Blood Loss:  0 mL Specimens:  None  Findings:  - Successful placement of left-sided, tunneled "Powerline" catheter with the tip of the catheter in the proximal right atrium. - Thrombosed LUE basilic, brachial and axillary veins. Pt with Hx of multiple LUE PICCs.  Plan:  - Catheter is ready for use. - LUE Venous Duplex ordered. - Medical Oncology (Dr. Ernestina Penna office) made aware, for [facility] initiation.  See detailed procedure note with images in PACS. The patient tolerated the procedure well without incident or complication and was returned to Recovery in stable condition.    Michaelle Birks, MD [facility] Interventional Radiology Specialists [facility] Radiology   Pager. [place]

## 2021-11-10 NOTE — Progress Notes (Addendum)
Left upper extremity venous duplex completed. Refer to "CV Proc" under chart review to view preliminary results.  Preliminary results discussed with [name] of Dr. Ernestina Penna office.  11/10/2021 3:28 PM Kelby Aline., MHA, RVT, RDCS, RDMS

## 2021-11-13 ENCOUNTER — Other Ambulatory Visit: Payer: Self-pay

## 2021-11-13 ENCOUNTER — Encounter: Payer: Self-pay | Admitting: Nurse Practitioner

## 2021-11-13 ENCOUNTER — Inpatient Hospital Stay: Payer: BC Managed Care – PPO | Admitting: Hematology

## 2021-11-13 ENCOUNTER — Inpatient Hospital Stay: Payer: BC Managed Care – PPO | Attending: Nurse Practitioner

## 2021-11-13 ENCOUNTER — Inpatient Hospital Stay: Payer: BC Managed Care – PPO

## 2021-11-13 ENCOUNTER — Inpatient Hospital Stay (HOSPITAL_BASED_OUTPATIENT_CLINIC_OR_DEPARTMENT_OTHER): Payer: BC Managed Care – PPO | Admitting: Nurse Practitioner

## 2021-11-13 VITALS — BP 115/70 | HR 52 | Resp 18

## 2021-11-13 VITALS — BP 118/79 | HR 100 | Temp 97.8°F | Resp 17 | Wt 224.5 lb

## 2021-11-13 DIAGNOSIS — R110 Nausea: Secondary | ICD-10-CM | POA: Insufficient documentation

## 2021-11-13 DIAGNOSIS — K56609 Unspecified intestinal obstruction, unspecified as to partial versus complete obstruction: Secondary | ICD-10-CM | POA: Insufficient documentation

## 2021-11-13 DIAGNOSIS — C182 Malignant neoplasm of ascending colon: Secondary | ICD-10-CM

## 2021-11-13 DIAGNOSIS — K5900 Constipation, unspecified: Secondary | ICD-10-CM | POA: Insufficient documentation

## 2021-11-13 DIAGNOSIS — C772 Secondary and unspecified malignant neoplasm of intra-abdominal lymph nodes: Secondary | ICD-10-CM | POA: Diagnosis not present

## 2021-11-13 DIAGNOSIS — C786 Secondary malignant neoplasm of retroperitoneum and peritoneum: Secondary | ICD-10-CM | POA: Insufficient documentation

## 2021-11-13 DIAGNOSIS — C189 Malignant neoplasm of colon, unspecified: Secondary | ICD-10-CM | POA: Diagnosis not present

## 2021-11-13 DIAGNOSIS — Z5111 Encounter for antineoplastic chemotherapy: Secondary | ICD-10-CM | POA: Diagnosis not present

## 2021-11-13 DIAGNOSIS — I82622 Acute embolism and thrombosis of deep veins of left upper extremity: Secondary | ICD-10-CM | POA: Insufficient documentation

## 2021-11-13 DIAGNOSIS — F419 Anxiety disorder, unspecified: Secondary | ICD-10-CM

## 2021-11-13 DIAGNOSIS — G893 Neoplasm related pain (acute) (chronic): Secondary | ICD-10-CM | POA: Insufficient documentation

## 2021-11-13 DIAGNOSIS — Z8042 Family history of malignant neoplasm of prostate: Secondary | ICD-10-CM | POA: Insufficient documentation

## 2021-11-13 DIAGNOSIS — Z5189 Encounter for other specified aftercare: Secondary | ICD-10-CM | POA: Diagnosis not present

## 2021-11-13 DIAGNOSIS — R112 Nausea with vomiting, unspecified: Secondary | ICD-10-CM | POA: Insufficient documentation

## 2021-11-13 DIAGNOSIS — R530 Neoplastic (malignant) related fatigue: Secondary | ICD-10-CM

## 2021-11-13 DIAGNOSIS — Z7901 Long term (current) use of anticoagulants: Secondary | ICD-10-CM | POA: Insufficient documentation

## 2021-11-13 DIAGNOSIS — Z431 Encounter for attention to gastrostomy: Secondary | ICD-10-CM | POA: Insufficient documentation

## 2021-11-13 DIAGNOSIS — Z515 Encounter for palliative care: Secondary | ICD-10-CM | POA: Diagnosis not present

## 2021-11-13 DIAGNOSIS — K9423 Gastrostomy malfunction: Secondary | ICD-10-CM | POA: Insufficient documentation

## 2021-11-13 DIAGNOSIS — Z803 Family history of malignant neoplasm of breast: Secondary | ICD-10-CM | POA: Diagnosis not present

## 2021-11-13 LAB — GLUCOSE, CAPILLARY: Glucose-Capillary: 132 mg/dL — ABNORMAL HIGH (ref 70–99)

## 2021-11-13 MED ORDER — SODIUM CHLORIDE 0.9 % IV SOLN: Freq: Once | INTRAVENOUS | Status: AC

## 2021-11-13 MED ORDER — LORATADINE 10 MG PO TABS
10.0000 mg | ORAL_TABLET | Freq: Once | ORAL | Status: AC
  Administered 2021-11-13: 10 mg via ORAL
  Filled 2021-11-13: qty 1

## 2021-11-13 MED ORDER — SODIUM CHLORIDE 0.9 % IV SOLN
2400.0000 mg/m2 | INTRAVENOUS | Status: DC
  Administered 2021-11-13: 5850 mg via INTRAVENOUS
  Filled 2021-11-13: qty 117

## 2021-11-13 MED ORDER — LEUCOVORIN CALCIUM INJECTION 350 MG
400.0000 mg/m2 | Freq: Once | INTRAVENOUS | Status: AC
  Administered 2021-11-13: 972 mg via INTRAVENOUS
  Filled 2021-11-13: qty 48.6

## 2021-11-13 MED ORDER — DEXTROSE 5 % IV SOLN: Freq: Once | INTRAVENOUS | Status: AC

## 2021-11-13 MED ORDER — PALONOSETRON HCL INJECTION 0.25 MG/5ML
0.2500 mg | Freq: Once | INTRAVENOUS | Status: AC
  Administered 2021-11-13: 0.25 mg via INTRAVENOUS
  Filled 2021-11-13: qty 5

## 2021-11-13 MED ORDER — SODIUM CHLORIDE 0.9 % IV SOLN
150.0000 mg/m2 | Freq: Once | INTRAVENOUS | Status: AC
  Administered 2021-11-13: 360 mg via INTRAVENOUS
  Filled 2021-11-13: qty 18

## 2021-11-13 MED ORDER — DIPHENHYDRAMINE HCL 50 MG/ML IJ SOLN
50.0000 mg | Freq: Once | INTRAMUSCULAR | Status: AC
  Administered 2021-11-13: 50 mg via INTRAVENOUS
  Filled 2021-11-13: qty 1

## 2021-11-13 MED ORDER — SODIUM CHLORIDE 0.9 % IV SOLN: INTRAVENOUS | Status: DC

## 2021-11-13 MED ORDER — SODIUM CHLORIDE 0.9 % IV SOLN
150.0000 mg | Freq: Once | INTRAVENOUS | Status: AC
  Administered 2021-11-13: 150 mg via INTRAVENOUS
  Filled 2021-11-13: qty 150

## 2021-11-13 MED ORDER — MONTELUKAST SODIUM 10 MG PO TABS
10.0000 mg | ORAL_TABLET | Freq: Once | ORAL | Status: AC
  Administered 2021-11-13: 10 mg via ORAL
  Filled 2021-11-13: qty 1

## 2021-11-13 MED ORDER — METHYLPREDNISOLONE SODIUM SUCC 125 MG IJ SOLR
125.0000 mg | Freq: Once | INTRAMUSCULAR | Status: AC | PRN
  Administered 2021-11-13: 125 mg via INTRAVENOUS

## 2021-11-13 MED ORDER — FAMOTIDINE IN NACL 20-0.9 MG/50ML-% IV SOLN
20.0000 mg | Freq: Once | INTRAVENOUS | Status: AC | PRN
  Administered 2021-11-13: 20 mg via INTRAVENOUS

## 2021-11-13 MED ORDER — ATROPINE SULFATE 1 MG/ML IV SOLN
0.5000 mg | Freq: Once | INTRAVENOUS | Status: AC | PRN
  Administered 2021-11-13: 0.5 mg via INTRAVENOUS
  Filled 2021-11-13: qty 1

## 2021-11-13 MED ORDER — SODIUM CHLORIDE 0.9% FLUSH: 10.0000 mL | INTRAVENOUS | Status: DC | PRN

## 2021-11-13 MED ORDER — DIPHENHYDRAMINE HCL 50 MG/ML IJ SOLN
50.0000 mg | Freq: Once | INTRAMUSCULAR | Status: AC | PRN
  Administered 2021-11-13: 50 mg via INTRAVENOUS

## 2021-11-13 MED ORDER — LORAZEPAM 0.5 MG PO TABS: 0.5000 mg | ORAL_TABLET | Freq: Four times a day (QID) | ORAL | 0 refills | Status: AC | PRN

## 2021-11-13 MED ORDER — FAMOTIDINE IN NACL 20-0.9 MG/50ML-% IV SOLN
20.0000 mg | Freq: Once | INTRAVENOUS | Status: AC
  Administered 2021-11-13: 20 mg via INTRAVENOUS
  Filled 2021-11-13: qty 50

## 2021-11-13 MED ORDER — METHYLPREDNISOLONE SODIUM SUCC 125 MG IJ SOLR
125.0000 mg | Freq: Once | INTRAMUSCULAR | Status: AC
  Administered 2021-11-13: 125 mg via INTRAVENOUS
  Filled 2021-11-13: qty 2

## 2021-11-13 MED ORDER — OXALIPLATIN CHEMO INJECTION 100 MG/20ML
70.0000 mg/m2 | Freq: Once | INTRAVENOUS | Status: AC
  Administered 2021-11-13: 170 mg via INTRAVENOUS
  Filled 2021-11-13: qty 34

## 2021-11-13 MED ORDER — SODIUM CHLORIDE 0.9 % IV SOLN: Freq: Once | INTRAVENOUS | Status: DC | PRN

## 2021-11-13 NOTE — Progress Notes (Signed)
Pt presents to infusion room today for his treatment. Pt reports some redness around G tube site along with scant bleeding and some mucousy discharge. He states that is was caught in the couch recently. His Port a cath was accessed when he arrived. It was then deaccessed by this RN. The site looks pinkish. Not tender to the touch. Dr. Burr Medico notified. Dr. Burr Medico came to assess the G tube and [facility] a cath site. Per Dr. Burr Medico ok to treat patient w/ D1/C14 Folfoxiri and use port a cath for today's treatment. Skin around G tube cleaned and dressed w/ vaseline dressing and gauze. ? ?

## 2021-11-13 NOTE — Progress Notes (Signed)
Hypersensitivity Reaction note ? ?Date of event: 11/13/21 ?Time of event: 1300     ?Generic name of drug involved: Oxaliplatin ?Name of provider notified of the hypersensitivity reaction: Lacie annd Dr. Burr Medico ?Was agent that likely caused hypersensitivity reaction added to Allergies List within EMR? Yes, Pt had had a previous reaction.Marland Kitchen ?Chain of events including reaction signs/symptoms, treatment administered, and outcome (e.g., drug resumed; drug discontinued; sent to Emergency Department; etc.) 1300 Pt reports feeling "warm" Infusion paused. Pt appears flushed/pink Benadryl and Pepcid per MAR. Lacie at the bedside. CBG obtained. 132 mg/dl. She ordered Normal Saline to run at 999 for 85minutes. Dr. Burr Medico assessed patient and ordered 125 of solumedrol to be given at 1330, then rechallenge with the [facility]. ? ?1600 :Report given to [employer]. Pt tolerating Oxaliplatin well at this time. ? ?Charlaine Dalton, RN ?11/13/2021 2:22 PM ? ?

## 2021-11-13 NOTE — Progress Notes (Signed)
? ?  ?Palliative Medicine ?[place]  ?[phone] [phone] ? ? ?Name: Ricardo [name] ?Date: 11/13/2021 ?MRN: [id number]  ?DOB: 06-Jun-1966 ? ?Patient Care Team: ?[name], MD as PCP - General (Internal Medicine) ?[name], MD as Consulting Physician (Oncology) ?[name], RN (Inactive) as Oncology Nurse Navigator ?Pickenpack-Cousar, [name], NP as Nurse Practitioner (Nurse Practitioner)  ?  ? ?INTERVAL HISTORY: ?Ricardo [name] is a 56 y.o. 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 Patient recently hospitalized 12/14 and 12/20 for SBO which was improved by conservative measures. CT of abdomen pelvis showed small bowel obstruction with transition point likely in the mid abdomen. Venting G-tube was placed prior to discharge. Ricardo [name] was discharged on 09/13/2021 and required readmission on 09/14/2021 via EMS from home with similar concerns of abdominal pain, nausea, and vomiting. Imaging showes improvement in gaseous distention, however continues to have significant G-tube output, abdominal pain, nausea, and vomiting.  ? ?SOCIAL HISTORY:    ? reports that he has never smoked. He has never used smokeless tobacco. He reports that he does not currently use alcohol. He reports that he does not use drugs. ? ? ? ?PAST MEDICAL HISTORY: ?Past Medical History:  ?Diagnosis Date  ? Arthritis   ? Class 2 obesity 09/14/2021  ? Colon cancer ([place])   ? with metastasis  ? Family history of adverse reaction to anesthesia   ? mother had problem with it due to her asthma  ? Family history of breast cancer   ? Family history of prostate cancer   ? GAD (generalized anxiety disorder) 09/03/2021  ? GERD (gastroesophageal reflux disease) 09/03/2021  ? History of COVID-19 04/30/2021  ? Formatting of this note might be different from the original. 01/2021, asymptomatic  ? Hyperlipidemia 11/27/2020  ? Pleural effusion, left  09/06/2021  ? Sleep disorder 11/10/2018  ? ? ?ALLERGIES:  is allergic to oxaliplatin. ? ?MEDICATIONS:  ?VITAL SIGNS: ?BP 118/79 (BP Location: Right Arm, Patient Position: Sitting)   Pulse 100   Temp 97.8 ?F (36.6 ?C) (Oral)   Resp 17   Wt 224 lb 8 oz (101.8 kg)   SpO2 98%   BMI 31.31 kg/m?  ?Filed Weights  ? 11/13/21 0746  ?Weight: 224 lb 8 oz (101.8 kg)  ? ?  ?Estimated body mass index is 31.31 kg/m? as calculated from the following: ?  Height as of 11/10/21: 5\' 11"  (1.803 m). ?  Weight as of this encounter: 224 lb 8 oz (101.8 kg). ? ? ?PERFORMANCE STATUS (ECOG) : 1 - Symptomatic but completely ambulatory ? ? ?Physical Exam ?General: NAD ?Cardio: RRR ?GI: soft, nontender, G-tube in place connected to bag ?Neurological: AAOx4 ? ?IMPRESSION: ? ?Ricardo [name] is here today for follow-up symptom management. No acute distress noted. Endorses some fatigue but has been able to return back to work several days a week half days. He is unable to work full days due to increase fatigue. Also shares if his bag remains clamped for long periods he becomes nauseated. Recently had central line placed. Site clean, dry, intact. Nurse is coming out weekly to his home to change dressing.  ? ?His wife is having a procedure on a facial skin area to rule out cancer. He is hopeful for good results.  ? ?Abdominal pain related to neoplasm ?Pain is well controlled on current regimen.  States he must follow regimen around the clock and stay ahead of his pain  and discomfort. He is appreciative of his improvement sharing if he didn't know he had cancer he would "actually" feel ok at this point.  ?Current regimen: 200 mcg of fentanyl patch  ?Dilaudid 8 mg every 3-4 hours as needed for breakthrough pain. Tolerating well. Does not have to take every 4 hours. At times able to go for longer periods.  ?Cymbalta 30 mg daily  ?             ?2.    Nausea/vomiting ?Nausea and vomiting well controlled. Takes medication as needed.  ?Compazine 10 mg as  needed for nausea ?Pantoprazole 40 mg daily.   ?Decadron 4mg  twice daily  ?Octreotide has been added to his daily TPN in the home ?    ?3. Anxiety ?Ativan 0.5mg  every 6 hours as needed for nausea and anxiety.  ? ?4.  Constipation ?Patient reports he is having daily bowel movements.  Denies diarrhea or constipation. Venting G-tube is draining to catheter bag.  ? ? ?PLAN: ?No adjustments to current regimen.  ?He has been able to return to work on a limited basis which he is appreciative of. Unable to work full days due to fatigue.  ?Provided G-tube drainage bags today. Unable to obtain from home health company.  ?Ongoing support and close evaluation of effective symptom management.  Goal is to keep him out of the hospital and improve quality of life. This has been much improved from previous months.  ?I will plan to see patient back in the clinic 2-3 weeks with future oncology appointments. ? ? ?Patient expressed understanding and was in agreement with this plan. He also understands that He can call the clinic at any time with any questions, concerns, or complaints.  ? ?Time Total: 35 min.  ? ?Visit consisted of counseling and education dealing with the complex and emotionally intense issues of symptom management and palliative care in the setting of serious and potentially life-threatening illness.Greater than 50%  of this time was spent counseling and coordinating care related to the above assessment and plan. ? ?Alda Lea, AGPCNP-BC  ?[place] ? ? ?  ?

## 2021-11-14 ENCOUNTER — Encounter: Payer: Self-pay | Admitting: Hematology

## 2021-11-15 ENCOUNTER — Other Ambulatory Visit: Payer: Self-pay

## 2021-11-15 ENCOUNTER — Inpatient Hospital Stay: Payer: BC Managed Care – PPO

## 2021-11-15 VITALS — BP 122/84 | HR 97 | Temp 97.8°F | Resp 17 | Ht 71.0 in

## 2021-11-15 DIAGNOSIS — C182 Malignant neoplasm of ascending colon: Secondary | ICD-10-CM

## 2021-11-15 DIAGNOSIS — C772 Secondary and unspecified malignant neoplasm of intra-abdominal lymph nodes: Secondary | ICD-10-CM

## 2021-11-15 DIAGNOSIS — Z5111 Encounter for antineoplastic chemotherapy: Secondary | ICD-10-CM | POA: Diagnosis not present

## 2021-11-15 MED ORDER — HEPARIN SOD (PORK) LOCK FLUSH 100 UNIT/ML IV SOLN
500.0000 [IU] | Freq: Once | INTRAVENOUS | Status: AC | PRN
  Administered 2021-11-15: 500 [IU]

## 2021-11-15 MED ORDER — PEGFILGRASTIM-CBQV 6 MG/0.6ML ~~LOC~~ SOSY
6.0000 mg | PREFILLED_SYRINGE | Freq: Once | SUBCUTANEOUS | Status: AC
  Administered 2021-11-15: 6 mg via SUBCUTANEOUS

## 2021-11-15 MED ORDER — SODIUM CHLORIDE 0.9% FLUSH
10.0000 mL | INTRAVENOUS | Status: DC | PRN
  Administered 2021-11-15: 10 mL

## 2021-11-15 NOTE — Progress Notes (Signed)
Pt stated he feels fine today and refused IV fluids that were in his D/C pump treatment plan. Udenyca given and pump d/c completed.  ?

## 2021-11-19 ENCOUNTER — Telehealth: Payer: Self-pay

## 2021-11-19 ENCOUNTER — Other Ambulatory Visit: Payer: Self-pay

## 2021-11-19 NOTE — Telephone Encounter (Signed)
Returned call to [facility] with [facility] [phone]) regarding dental clearance for pt to get a filling done.  Pt is actively receiving chemotherapy; therefore, it is NOT recommended that the pt have a dental filling done.  Dr. Burr Medico stated that if the filling in done on the 3wk in between his chemo cycles, his ANC counts are usually very good.   ?

## 2021-11-19 NOTE — Telephone Encounter (Signed)
Pt LVM stating he would like a refill on his Dilaudid pain medication.  Please send refill to preferred pharmacy. Notified Dr. Burr Medico of pt's request for refill. ?

## 2021-11-20 ENCOUNTER — Other Ambulatory Visit: Payer: Self-pay | Admitting: Hematology

## 2021-11-20 MED ORDER — HYDROMORPHONE HCL 4 MG PO TABS: 8.0000 mg | ORAL_TABLET | ORAL | 0 refills | Status: DC | PRN

## 2021-11-21 ENCOUNTER — Encounter: Payer: Self-pay | Admitting: Hematology

## 2021-11-24 ENCOUNTER — Other Ambulatory Visit: Payer: Self-pay

## 2021-11-24 ENCOUNTER — Telehealth: Payer: Self-pay

## 2021-11-24 ENCOUNTER — Emergency Department (HOSPITAL_COMMUNITY)
Admission: EM | Admit: 2021-11-24 | Discharge: 2021-11-24 | Disposition: A | Discharge status: Home / Self Care | Payer: BC Managed Care – PPO | Attending: Emergency Medicine | Admitting: Emergency Medicine

## 2021-11-24 ENCOUNTER — Encounter (HOSPITAL_COMMUNITY): Payer: Self-pay

## 2021-11-24 DIAGNOSIS — Z85038 Personal history of other malignant neoplasm of large intestine: Secondary | ICD-10-CM | POA: Diagnosis not present

## 2021-11-24 DIAGNOSIS — E860 Dehydration: Secondary | ICD-10-CM | POA: Diagnosis not present

## 2021-11-24 DIAGNOSIS — R112 Nausea with vomiting, unspecified: Secondary | ICD-10-CM

## 2021-11-24 DIAGNOSIS — T451X1A Poisoning by antineoplastic and immunosuppressive drugs, accidental (unintentional), initial encounter: Secondary | ICD-10-CM | POA: Diagnosis not present

## 2021-11-24 DIAGNOSIS — Z79899 Other long term (current) drug therapy: Secondary | ICD-10-CM | POA: Insufficient documentation

## 2021-11-24 LAB — CBC WITH DIFFERENTIAL/PLATELET
Abs Immature Granulocytes: 0.07 10*3/uL (ref 0.00–0.07)
Basophils Absolute: 0 10*3/uL (ref 0.0–0.1)
Basophils Relative: 1 %
Eosinophils Absolute: 0 10*3/uL (ref 0.0–0.5)
Eosinophils Relative: 0 %
HCT: 35.4 % — ABNORMAL LOW (ref 39.0–52.0)
Hemoglobin: 11.3 g/dL — ABNORMAL LOW (ref 13.0–17.0)
Immature Granulocytes: 3 %
Lymphocytes Relative: 24 %
Lymphs Abs: 0.6 10*3/uL — ABNORMAL LOW (ref 0.7–4.0)
MCH: 28.8 pg (ref 26.0–34.0)
MCHC: 31.9 g/dL (ref 30.0–36.0)
MCV: 90.1 fL (ref 80.0–100.0)
Monocytes Absolute: 0.6 10*3/uL (ref 0.1–1.0)
Monocytes Relative: 23 %
Neutro Abs: 1.2 10*3/uL — ABNORMAL LOW (ref 1.7–7.7)
Neutrophils Relative %: 49 %
Platelets: 255 10*3/uL (ref 150–400)
RBC: 3.93 MIL/uL — ABNORMAL LOW (ref 4.22–5.81)
RDW: 21 % — ABNORMAL HIGH (ref 11.5–15.5)
WBC: 2.5 10*3/uL — ABNORMAL LOW (ref 4.0–10.5)
nRBC: 0 % (ref 0.0–0.2)

## 2021-11-24 LAB — BASIC METABOLIC PANEL
Anion gap: 7 (ref 5–15)
BUN: 31 mg/dL — ABNORMAL HIGH (ref 6–20)
CO2: 28 mmol/L (ref 22–32)
Calcium: 8.1 mg/dL — ABNORMAL LOW (ref 8.9–10.3)
Chloride: 107 mmol/L (ref 98–111)
Creatinine, Ser: 0.69 mg/dL (ref 0.61–1.24)
GFR, Estimated: >60 mL/min (ref >60)
Glucose, Bld: 174 mg/dL — ABNORMAL HIGH (ref 70–99)
Potassium: 3.7 mmol/L (ref 3.5–5.1)
Sodium: 142 mmol/L (ref 135–145)

## 2021-11-24 MED ORDER — METOCLOPRAMIDE HCL 5 MG/ML IJ SOLN
10.0000 mg | Freq: Once | INTRAMUSCULAR | Status: AC
  Administered 2021-11-24: 10 mg via INTRAVENOUS
  Filled 2021-11-24: qty 2

## 2021-11-24 MED ORDER — LACTATED RINGERS IV BOLUS
2000.0000 mL | Freq: Once | INTRAVENOUS | Status: AC
  Administered 2021-11-24: 2000 mL via INTRAVENOUS

## 2021-11-24 MED ORDER — HEPARIN SOD (PORK) LOCK FLUSH 100 UNIT/ML IV SOLN
500.0000 [IU] | Freq: Once | INTRAVENOUS | Status: AC
  Administered 2021-11-24: 500 [IU]
  Filled 2021-11-24: qty 5

## 2021-11-24 MED ORDER — PROMETHAZINE HCL 25 MG RE SUPP: 25.0000 mg | Freq: Four times a day (QID) | RECTAL | 1 refills | Status: AC | PRN

## 2021-11-24 NOTE — ED Triage Notes (Signed)
Pt. BIB [facility] c/o NVD x1 week. Per EMS pt. Had his last chemo treatment on 11/13/2021 and symptoms have been recurrent since then. Pt. States that the only pain that he is having is the pain from his cancer. Given '4mg'$  zofran en route by EMS.  ? ?EMS VS: ?BP:130/82 ?HR:100 ?RR: 18 ?O2: 95% RA ?

## 2021-11-24 NOTE — ED Provider Notes (Signed)
[facility] DEPT Provider Note: [name], MD, FACEP  CSN: [id number] MRN: [id number] ARRIVAL: 11/24/21 at [place]: [place]  N/V/D   HISTORY OF PRESENT ILLNESS  11/24/21 4:00 AM Ricardo [name] is a 56 y.o. male with metastatic colon cancer.  The cancer has obstructed connection between his stomach and bowels and so he has a gastrostomy tube that drains out any fluid he drinks.  He does not eat but has a TPN infusion.  After getting his most recent chemotherapy infusion (a complex cocktail) 11/13/2021 and pegfilgrastim infusion 11/15/2021.  Since the chemotherapy infusion he has had recurrent nausea and vomiting.  He believes he is dehydrated partly because he did not receive an IV fluid bolus on 11/15/2021 (nursing notes indicate he refused the IV fluids) and partly because the vomiting persists.  He is having generalized weakness.  He has had some diarrhea with this as well although he does not normally have bowel movements.  He has no new abdominal pain, just the baseline pain associated with the cancer.  He was given Zofran by EMS prior to arrival without relief.   Past Medical History:  Diagnosis Date   Arthritis    Class 2 obesity 09/14/2021   Colon cancer [facility])    with metastasis   Family history of adverse reaction to anesthesia    mother had problem with it due to her asthma   Family history of breast cancer    Family history of prostate cancer    GAD (generalized anxiety disorder) 09/03/2021   GERD (gastroesophageal reflux disease) 09/03/2021   History of COVID-19 04/30/2021   Formatting of this note might be different from the original. 01/2021, asymptomatic   Hyperlipidemia 11/27/2020   Pleural effusion, left 09/06/2021   Sleep disorder 11/10/2018    Past Surgical History:  Procedure Laterality Date   BOWEL RESECTION N/A 11/29/2020   Procedure: PARTIAL BOWEL RESECTION;  Surgeon: Jovita Kussmaul, MD;  Location: [place];  Service: General;  Laterality:  N/A;   GASTROSTOMY Left 11/29/2020   Procedure: INSERTION OF GASTROSTOMY TUBE;  Surgeon: Jovita Kussmaul, MD;  Location: [facility] OR;  Service: General;  Laterality: Left;   IR FLUORO GUIDE CV LINE LEFT  11/10/2021   IR GASTROSTOMY TUBE MOD SED  09/11/2021   IR IMAGING GUIDED PORT INSERTION  12/12/2020   IR US GUIDE VASC ACCESS LEFT  11/10/2021   LAPAROSCOPIC APPENDECTOMY N/A 01/17/2019   Procedure: APPENDECTOMY LAPAROSCOPIC;  Surgeon: Ralene Ok, MD;  Location: [place];  Service: General;  Laterality: N/A;   LAPAROTOMY N/A 11/29/2020   Procedure: EXPLORATORY LAPAROTOMY;  Surgeon: Jovita Kussmaul, MD;  Location: [place];  Service: General;  Laterality: N/A;  PUT CASE IN ROOM 1 STARTING AT 9:30AM FOR 120 MIN   OSTOMY N/A 11/29/2020   Procedure: POSSIBLE OSTOMY CREATION;  Surgeon: Jovita Kussmaul, MD;  Location: [place];  Service: General;  Laterality: N/A;   RECTAL BIOPSY N/A 11/29/2020   Procedure: PERITIONEAL BIOPSY;  Surgeon: Jovita Kussmaul, MD;  Location: [place];  Service: General;  Laterality: N/A;   SHOULDER SURGERY Left 09/19/2015    Family History  Problem Relation Age of Onset   Cancer Sister 13       breast cancer   Cancer Brother 38       prostate cancer    High Cholesterol Brother    Pancreatic cancer Maternal Uncle    Bone cancer Cousin  pat first cousin   Colon cancer Neg Hx    Liver disease Neg Hx    Esophageal cancer Neg Hx    Stomach cancer Neg Hx     Social History   Tobacco Use   Smoking status: Never   Smokeless tobacco: Never  Vaping Use   Vaping Use: Never used  Substance Use Topics   Alcohol use: Not Currently    Comment: rare   Drug use: Never    Prior to Admission medications   Medication Sig Start Date End Date Taking? Authorizing Provider  DULoxetine (CYMBALTA) 30 MG capsule Take 1 capsule (30 mg total) by mouth daily. 10/15/21   Lavina Hamman, MD  fentaNYL (DURAGESIC) 100 MCG/HR Place 2 patches onto the skin every 3 (three) days. Along with 50 MCG/HR  patch for a total of 250 MCG/HR dose. 10/30/21 11/29/21  Pickenpack-Cousar, [name], NP  fentaNYL (DURAGESIC) 50 MCG/HR Place 1 patch onto the skin every 3 (three) days. Along with 2 of 100 MCG/HR patch for a total of 250 MCG/HR dose. 10/31/21   Pickenpack-Cousar, [name], NP  HYDROmorphone (DILAUDID) 4 MG tablet Take 2 tablets (8 mg total) by mouth every 4 (four) hours as needed for severe pain (Take 2 tablets ('8mg'$ ) every 3-4 hourse as needed for severe pain.). 10/21/21   Pickenpack-Cousar, [name], NP  HYDROmorphone (DILAUDID) 4 MG tablet Take 2 tablets (8 mg total) by mouth every 3 (three) hours as needed for severe pain or moderate pain. Take 2 tablets ('8mg'$ ) every 3-4 hours as needed for breakthrough pain. 11/20/21   [name], MD  LORazepam (ATIVAN) 0.5 MG tablet Take 1 tablet (0.5 mg total) by mouth every 6 (six) hours as needed for anxiety. 11/13/21 11/13/22  Pickenpack-Cousar, [name], NP  naloxone [facility]) nasal spray 4 mg/0.1 mL Use 1 spray for opioid overdose Patient taking differently: 0.4 mg once as needed (opioid overdose). 09/12/21   Debbe Odea, MD  Nutritional Supplements (FEEDING SUPPLEMENT, BOOST BREEZE,) LIQD Take 1 each by mouth 3 (three) times daily before meals. 10/14/21   Lavina Hamman, MD  pantoprazole sodium (PROTONIX) 40 mg Place 40 mg into feeding tube daily. 10/16/21   [name], MD  polyethylene glycol (MIRALAX / GLYCOLAX) 17 g packet Take 17 g by mouth daily as needed for mild constipation.    [provider]  prochlorperazine (COMPAZINE) 10 MG tablet Take 1 tablet (10 mg total) by mouth every 6 (six) hours as needed for nausea or vomiting. 09/26/21   [name], MD  RIVAROXABAN [employer]) VTE STARTER PACK (15 & 20 MG) Follow package directions: Take one '15mg'$  tablet by mouth twice a day. On day 22, switch to one '20mg'$  tablet once a day. Take with food. 11/10/21   [name], MD    Allergies Oxaliplatin   REVIEW OF SYSTEMS  Negative except as noted here or in the History  of Present Illness.   PHYSICAL EXAMINATION  Initial Vital Signs Blood pressure (!) 133/91, pulse (!) 112, temperature 97.8 F (36.6 C), temperature source Oral, resp. rate 20, SpO2 98 %.  Examination General: Well-developed, well-nourished male in no acute distress; appearance consistent with age of record HENT: normocephalic; atraumatic Eyes: Normal appearance Neck: supple Heart: regular rate and rhythm; tachycardia Lungs: clear to auscultation bilaterally Chest: Port-A-Cath right upper chest; Vas-Cath left upper chest with TPN infusing through venous port Abdomen: soft; old, healed surgical scars; mild diffuse tenderness; PEG tube left upper quadrant draining mostly bilious material; bowel  sounds present Extremities: No deformity; full range of motion; pulses normal Neurologic: Awake, alert and oriented; motor function intact in all extremities and symmetric; no facial droop Skin: Warm and dry Psychiatric: Normal mood and affect   RESULTS  Summary of this visit's results, reviewed and interpreted by myself:   EKG Interpretation  Date/Time:    Ventricular Rate:    PR Interval:    QRS Duration:   QT Interval:    QTC Calculation:   R Axis:     Text Interpretation:         Laboratory Studies: Results for orders placed or performed during the hospital encounter of 11/24/21 (from the past 24 hour(s))  CBC with Differential     Status: Abnormal   Collection Time: 11/24/21  5:29 AM  Result Value Ref Range   WBC 2.5 (L) 4.0 - 10.5 K/uL   RBC 3.93 (L) 4.22 - 5.81 MIL/uL   Hemoglobin 11.3 (L) 13.0 - 17.0 g/dL   HCT 35.4 (L) 39.0 - 52.0 %   MCV 90.1 80.0 - 100.0 fL   MCH 28.8 26.0 - 34.0 pg   MCHC 31.9 30.0 - 36.0 g/dL   RDW 21.0 (H) 11.5 - 15.5 %   Platelets 255 150 - 400 K/uL   nRBC 0.0 0.0 - 0.2 %   Neutrophils Relative % 49 %   Neutro Abs 1.2 (L) 1.7 - 7.7 K/uL   Lymphocytes Relative 24 %   Lymphs Abs 0.6 (L) 0.7 - 4.0 K/uL   Monocytes Relative 23 %   Monocytes  Absolute 0.6 0.1 - 1.0 K/uL   Eosinophils Relative 0 %   Eosinophils Absolute 0.0 0.0 - 0.5 K/uL   Basophils Relative 1 %   Basophils Absolute 0.0 0.0 - 0.1 K/uL   Immature Granulocytes 3 %   Abs Immature Granulocytes 0.07 0.00 - 0.07 K/uL   Polychromasia PRESENT   Basic metabolic panel     Status: Abnormal   Collection Time: 11/24/21  5:29 AM  Result Value Ref Range   Sodium 142 135 - 145 mmol/L   Potassium 3.7 3.5 - 5.1 mmol/L   Chloride 107 98 - 111 mmol/L   CO2 28 22 - 32 mmol/L   Glucose, Bld 174 (H) 70 - 99 mg/dL   BUN 31 (H) 6 - 20 mg/dL   Creatinine, Ser 0.69 0.61 - 1.24 mg/dL   Calcium 8.1 (L) 8.9 - 10.3 mg/dL   GFR, Estimated >60 >60 mL/min   Anion gap 7 5 - 15   Imaging Studies: No results found.  ED COURSE and MDM  Nursing notes, initial and subsequent vitals signs, including pulse oximetry, reviewed and interpreted by myself.  Vitals:   11/24/21 0359  BP: (!) 133/91  Pulse: (!) 112  Resp: 20  Temp: 97.8 F (36.6 C)  TempSrc: Oral  SpO2: 98%   Medications  lactated ringers bolus 2,000 mL (2,000 mLs Intravenous New Bag/Given 11/24/21 0423)  metoCLOPramide (REGLAN) injection 10 mg (10 mg Intravenous Given 11/24/21 0423)   6:07 AM Patient feeling significantly improved after 2 L of lactated Ringer's IV.  Nausea has been controlled with Reglan and he is drinking fluids without vomiting.  He is requesting a prescription for Phenergan suppositories.  His white count is low, as would be expected following chemotherapy, but he is not neutropenic.   PROCEDURES  Procedures   ED DIAGNOSES     ICD-10-CM   1. Chemotherapy-induced nausea and vomiting  R11.2    T45.1X5A  2. Dehydration  E86.0          Leeanna Slaby, Jenny Reichmann, MD 11/24/21 [phone]

## 2021-11-24 NOTE — Telephone Encounter (Signed)
Ricardo [name] called to notify our office that his Fentanyl patches would be running out this week. I told him I would let [name], NP know tomorrow since she is out of the office today, and would make sure to get his refill in on time. His wife also explained that he hasn't felt good this weekend and was having nausea and vomiting-she stated that they went to the ED this morning and that he felt better once receiving fluids. I advised her to call our office if this happens again to see if we would be able to give him fluids here at [facility] if he needs them. Understanding verbalized. All questions answered. Advised to call back with any questions/concerns.  ?

## 2021-11-26 ENCOUNTER — Inpatient Hospital Stay (HOSPITAL_BASED_OUTPATIENT_CLINIC_OR_DEPARTMENT_OTHER): Payer: BC Managed Care – PPO | Admitting: Nurse Practitioner

## 2021-11-26 ENCOUNTER — Encounter: Payer: Self-pay | Admitting: Nurse Practitioner

## 2021-11-26 ENCOUNTER — Telehealth: Payer: Self-pay

## 2021-11-26 ENCOUNTER — Other Ambulatory Visit: Payer: Self-pay | Admitting: Nurse Practitioner

## 2021-11-26 ENCOUNTER — Inpatient Hospital Stay: Payer: BC Managed Care – PPO

## 2021-11-26 ENCOUNTER — Other Ambulatory Visit: Payer: Self-pay

## 2021-11-26 VITALS — BP 125/91 | HR 129 | Resp 18 | Wt 214.3 lb

## 2021-11-26 DIAGNOSIS — Z515 Encounter for palliative care: Secondary | ICD-10-CM | POA: Diagnosis not present

## 2021-11-26 DIAGNOSIS — Z803 Family history of malignant neoplasm of breast: Secondary | ICD-10-CM

## 2021-11-26 DIAGNOSIS — K5900 Constipation, unspecified: Secondary | ICD-10-CM

## 2021-11-26 DIAGNOSIS — Z5111 Encounter for antineoplastic chemotherapy: Secondary | ICD-10-CM

## 2021-11-26 DIAGNOSIS — C786 Secondary malignant neoplasm of retroperitoneum and peritoneum: Secondary | ICD-10-CM

## 2021-11-26 DIAGNOSIS — R112 Nausea with vomiting, unspecified: Secondary | ICD-10-CM | POA: Diagnosis not present

## 2021-11-26 DIAGNOSIS — C189 Malignant neoplasm of colon, unspecified: Secondary | ICD-10-CM

## 2021-11-26 DIAGNOSIS — R110 Nausea: Secondary | ICD-10-CM

## 2021-11-26 DIAGNOSIS — Z8042 Family history of malignant neoplasm of prostate: Secondary | ICD-10-CM

## 2021-11-26 DIAGNOSIS — R530 Neoplastic (malignant) related fatigue: Secondary | ICD-10-CM

## 2021-11-26 DIAGNOSIS — F419 Anxiety disorder, unspecified: Secondary | ICD-10-CM

## 2021-11-26 DIAGNOSIS — Z95828 Presence of other vascular implants and grafts: Secondary | ICD-10-CM

## 2021-11-26 DIAGNOSIS — R630 Anorexia: Secondary | ICD-10-CM

## 2021-11-26 DIAGNOSIS — Z5189 Encounter for other specified aftercare: Secondary | ICD-10-CM

## 2021-11-26 DIAGNOSIS — G893 Neoplasm related pain (acute) (chronic): Secondary | ICD-10-CM

## 2021-11-26 MED ORDER — ONDANSETRON HCL 4 MG/2ML IJ SOLN
4.0000 mg | Freq: Once | INTRAMUSCULAR | Status: AC
  Administered 2021-11-26: 4 mg via INTRAVENOUS
  Filled 2021-11-26: qty 2

## 2021-11-26 MED ORDER — SODIUM CHLORIDE 0.9% FLUSH
10.0000 mL | Freq: Once | INTRAVENOUS | Status: AC
  Administered 2021-11-26: 10 mL

## 2021-11-26 MED ORDER — METOCLOPRAMIDE HCL 5 MG/ML IJ SOLN
10.0000 mg | Freq: Once | INTRAMUSCULAR | Status: AC
  Administered 2021-11-26: 10 mg via INTRAVENOUS
  Filled 2021-11-26: qty 2

## 2021-11-26 MED ORDER — FENTANYL 100 MCG/HR TD PT72: 2.0000 | MEDICATED_PATCH | TRANSDERMAL | 0 refills | Status: AC

## 2021-11-26 MED ORDER — LORAZEPAM 2 MG/ML IJ SOLN
0.5000 mg | Freq: Once | INTRAMUSCULAR | Status: AC
  Administered 2021-11-26: 0.5 mg via INTRAVENOUS
  Filled 2021-11-26: qty 1

## 2021-11-26 MED ORDER — FENTANYL 50 MCG/HR TD PT72: 1.0000 | MEDICATED_PATCH | TRANSDERMAL | 0 refills | Status: AC

## 2021-11-26 MED ORDER — SODIUM CHLORIDE 0.9 % IV SOLN: INTRAVENOUS | Status: DC

## 2021-11-26 NOTE — Progress Notes (Signed)
? ?  ?Palliative Medicine ?[place]  ?Telephone:(336) [phone] [phone] ? ? ?Name: Ricardo [name] ?Date: 11/26/2021 ?MRN: [id number]  ?DOB: 1966-01-03 ? ?Patient Care Team: ?[name], MD as PCP - General (Internal Medicine) ?[name], MD as Consulting Physician (Oncology) ?[name], RN (Inactive) as Oncology Nurse Navigator ?Pickenpack-Cousar, [name], NP as Nurse Practitioner (Nurse Practitioner)  ?  ? ?INTERVAL HISTORY: ?Ricardo [name] is a 56 y.o. 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 Patient recently hospitalized 12/14 and 12/20 for SBO which was improved by conservative measures. CT of abdomen pelvis showed small bowel obstruction with transition point likely in the mid abdomen. Venting G-tube was placed prior to discharge. Mr. Gartman was discharged on 09/13/2021 and required readmission on 09/14/2021 via EMS from home with similar concerns of abdominal pain, nausea, and vomiting. Imaging showes improvement in gaseous distention, however continues to have significant G-tube output, abdominal pain, nausea, and vomiting.  ? ?SOCIAL HISTORY:    ? reports that he has never smoked. He has never used smokeless tobacco. He reports that he does not currently use alcohol. He reports that he does not use drugs. ? ? ? ?PAST MEDICAL HISTORY: ?Past Medical History:  ?Diagnosis Date  ? Arthritis   ? Class 2 obesity 09/14/2021  ? Colon cancer ([place])   ? with metastasis  ? Family history of adverse reaction to anesthesia   ? mother had problem with it due to her asthma  ? Family history of breast cancer   ? Family history of prostate cancer   ? GAD (generalized anxiety disorder) 09/03/2021  ? GERD (gastroesophageal reflux disease) 09/03/2021  ? History of COVID-19 04/30/2021  ? Formatting of this note might be different from the original. 01/2021, asymptomatic  ? Hyperlipidemia 11/27/2020  ? Pleural effusion, left  09/06/2021  ? Sleep disorder 11/10/2018  ? ? ?ALLERGIES:  is allergic to oxaliplatin. ? ?MEDICATIONS:  ?VITAL SIGNS: ?BP (!) 125/91 (BP Location: Left Arm, Patient Position: Sitting)   Pulse (!) 129   Resp 18   Wt 214 lb 5 oz (97.2 kg)   SpO2 99%   BMI 29.89 kg/m?  ?Filed Weights  ? 11/26/21 1332  ?Weight: 214 lb 5 oz (97.2 kg)  ? ?  ?Estimated body mass index is 29.89 kg/m? as calculated from the following: ?  Height as of 11/15/21: '5\' 11"'$  (1.803 m). ?  Weight as of this encounter: 214 lb 5 oz (97.2 kg). ? ? ?PERFORMANCE STATUS (ECOG) : 1 - Symptomatic but completely ambulatory ? ? ?Physical Exam ?General: NAD ?Cardio: RRR ?GI: soft, nontender, G-tube in place connected to bag ?Neurological: AAOx4 ? ?IMPRESSION: ? ?Ricardo [name] presents to clinic today with ongoing nausea and vomiting. He received 2L LR and Reglan on Monday in the ER. Improved however over the past 24 hours nausea has escalated. Wife states he did not sleep at all during the night due to constant vomiting. He is weak appearing. Reports pain is controlled. Is sipping on ice tea which he states is comforting. Days prior feels his appetite and intake was good. G-tube draining. Yellow tinged content observed in bag. Endorses several bowel movements this week which is actually an improvement for him as he has not been having regular bowel movements. Passing gas.  ? ?Observed patient vomiting clear yellow emesis. He has used 2 emesis bags. Will plan for IVF and antiemetics during visit today. If improved will anticipate  returning home and returning on Friday for follow-up. He is also asking about ability to receive IVF in the home via [facility] associated with his TPN as needed.  ? ?Abdominal pain related to neoplasm ?Pain is well controlled on current regimen.  States he must follow regimen around the clock and stay ahead of his pain and discomfort. He is appreciative of his improvement sharing if he didn't know he had cancer he would "actually" feel ok at  this point.  ?Current regimen: 200 mcg of fentanyl patch  ?Dilaudid 8 mg every 3-4 hours as needed for breakthrough pain. Tolerating well. Does not have to take every 4 hours. At times able to go for longer periods.  ?Cymbalta 30 mg daily  ?             ?2.    Nausea/vomiting ?Nausea and vomiting uncontrolled over the past 3-4 days. Will receive 1L IVF, Reglan, zofran during visit today for symptom management and hopes of avoiding hospitalization. Discussed with Dr. Burr Medico. Will plan follow-up on Friday. Continue home regimen: ?Compazine 10 mg as needed for nausea ?Pantoprazole 40 mg daily.   ?Decadron '4mg'$  twice daily  ?Octreotide has been added to his daily TPN in the home ?    ?3. Anxiety ?Ativan 0.'5mg'$  every 6 hours as needed for nausea and anxiety.  ? ?4.  Constipation ?Patient reports he has had 2-3 bowel movements this week which is an improvement.  Denies diarrhea or constipation. Venting G-tube is draining to catheter bag.  ? ? ?PLAN: ?IVF, antiemetics in clinic today for intractable nausea and vomiting.  ?Ongoing support and close evaluation of effective symptom management.  Goal is to keep him out of the hospital and improve quality of life. This has been much improved from previous months.  ?Feeling better at the time of completion. Verbalized understanding for follow-up on Friday. Will call sooner if needed.  ?I will plan to see patient back in the clinic on Friday for follow-up and ongong symptom management. Discussed with Dr. Burr Medico. Will obtain scans next week if symptoms persist.  ? ? ?Patient expressed understanding and was in agreement with this plan. He also understands that He can call the clinic at any time with any questions, concerns, or complaints.  ? ?Time Total: 55 min.  ? ?Visit consisted of counseling and education dealing with the complex and emotionally intense issues of symptom management and palliative care in the setting of serious and potentially life-threatening illness.Greater than 50%   of this time was spent counseling and coordinating care related to the above assessment and plan. ? ?Alda Lea, AGPCNP-BC  ?[place] ? ? ? ?  ?

## 2021-11-26 NOTE — Telephone Encounter (Signed)
Mr. Derossett wife, [name], called our office to inform us that Mr. Martino has been vomiting throughout the night and is now sleeping. Per [name], NP, we can give him fluids and anti-emetics in the office and evaluate him. Patient agreeable to coming in. All questions answered.  ?

## 2021-11-28 ENCOUNTER — Other Ambulatory Visit: Payer: Self-pay

## 2021-11-28 ENCOUNTER — Inpatient Hospital Stay (HOSPITAL_BASED_OUTPATIENT_CLINIC_OR_DEPARTMENT_OTHER): Payer: BC Managed Care – PPO | Admitting: Nurse Practitioner

## 2021-11-28 ENCOUNTER — Encounter: Payer: Self-pay | Admitting: Nurse Practitioner

## 2021-11-28 ENCOUNTER — Inpatient Hospital Stay: Payer: BC Managed Care – PPO

## 2021-11-28 ENCOUNTER — Telehealth: Payer: Self-pay

## 2021-11-28 VITALS — BP 113/82 | HR 122 | Resp 20 | Wt 210.2 lb

## 2021-11-28 DIAGNOSIS — Z515 Encounter for palliative care: Secondary | ICD-10-CM | POA: Diagnosis not present

## 2021-11-28 DIAGNOSIS — R531 Weakness: Secondary | ICD-10-CM

## 2021-11-28 DIAGNOSIS — Z5111 Encounter for antineoplastic chemotherapy: Secondary | ICD-10-CM | POA: Diagnosis not present

## 2021-11-28 DIAGNOSIS — R110 Nausea: Secondary | ICD-10-CM

## 2021-11-28 DIAGNOSIS — C786 Secondary malignant neoplasm of retroperitoneum and peritoneum: Secondary | ICD-10-CM

## 2021-11-28 DIAGNOSIS — Z95828 Presence of other vascular implants and grafts: Secondary | ICD-10-CM

## 2021-11-28 MED ORDER — METOCLOPRAMIDE HCL 5 MG/ML IJ SOLN
10.0000 mg | Freq: Once | INTRAMUSCULAR | Status: AC
  Administered 2021-11-28: 10 mg via INTRAVENOUS
  Filled 2021-11-28: qty 2

## 2021-11-28 MED ORDER — HEPARIN SOD (PORK) LOCK FLUSH 100 UNIT/ML IV SOLN
500.0000 [IU] | Freq: Once | INTRAVENOUS | Status: AC
  Administered 2021-11-28: 500 [IU]

## 2021-11-28 MED ORDER — SODIUM CHLORIDE 0.9% FLUSH
10.0000 mL | Freq: Once | INTRAVENOUS | Status: AC
  Administered 2021-11-28: 10 mL

## 2021-11-28 MED ORDER — LORAZEPAM 2 MG/ML IJ SOLN
0.5000 mg | Freq: Once | INTRAMUSCULAR | Status: AC
  Administered 2021-11-28: 0.5 mg via INTRAVENOUS
  Filled 2021-11-28: qty 1

## 2021-11-28 MED ORDER — SODIUM CHLORIDE 0.9 % IV SOLN: INTRAVENOUS | Status: DC

## 2021-11-28 MED ORDER — ONDANSETRON HCL 4 MG/2ML IJ SOLN
4.0000 mg | Freq: Once | INTRAMUSCULAR | Status: AC
  Administered 2021-11-28: 4 mg via INTRAVENOUS
  Filled 2021-11-28: qty 2

## 2021-11-28 NOTE — Telephone Encounter (Signed)
Mr. Mangiaracina stated that he is going to the beach this weekend with his family and asked if he would be able to go under water with his G-tube. I asked Dr. Burr Medico and per her recommendations, he does not need to fully submerge under water. She stated that he could go waist deep, but needs to keep that area clean and dry.  ?I attempted to call Mr. Riege and let him know, but he did not answer. I left this information on his VM.  ?

## 2021-11-28 NOTE — Progress Notes (Signed)
Patient treated with fluids and anti-emetics. Tolerated well. Stated that he is feeling better. I pushed patient in wheelchair to main lobby to wait for his wife to pick him up. Advised to call our office with any questions/concerns. Understanding verbalized.  ?

## 2021-11-28 NOTE — Progress Notes (Signed)
? ?  ?Palliative Medicine ?[place]  ?Telephone:(336) [phone] [phone] ? ? ?Name: Ricardo [name] ?Date: 11/28/2021 ?MRN: [id number]  ?DOB: Mar 09, 1966 ? ?Patient Care Team: ?[name], MD as PCP - General (Internal Medicine) ?[name], MD as Consulting Physician (Oncology) ?[name], RN (Inactive) as Oncology Nurse Navigator ?Pickenpack-Cousar, [name], NP as Nurse Practitioner (Nurse Practitioner)  ?  ? ?INTERVAL HISTORY: ?Ricardo [name] is a 56 y.o. 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 Patient recently hospitalized 12/14 and 12/20 for SBO which was improved by conservative measures. CT of abdomen pelvis showed small bowel obstruction with transition point likely in the mid abdomen. Venting G-tube was placed prior to discharge. Ricardo [name] was discharged on 09/13/2021 and required readmission on 09/14/2021 via EMS from home with similar concerns of abdominal pain, nausea, and vomiting. Imaging showes improvement in gaseous distention, however continues to have significant G-tube output, abdominal pain, nausea, and vomiting.  ? ?SOCIAL HISTORY:    ? reports that he has never smoked. He has never used smokeless tobacco. He reports that he does not currently use alcohol. He reports that he does not use drugs. ? ? ? ?PAST MEDICAL HISTORY: ?Past Medical History:  ?Diagnosis Date  ? Arthritis   ? Class 2 obesity 09/14/2021  ? Colon cancer ([place])   ? with metastasis  ? Family history of adverse reaction to anesthesia   ? mother had problem with it due to her asthma  ? Family history of breast cancer   ? Family history of prostate cancer   ? GAD (generalized anxiety disorder) 09/03/2021  ? GERD (gastroesophageal reflux disease) 09/03/2021  ? History of COVID-19 04/30/2021  ? Formatting of this note might be different from the original. 01/2021, asymptomatic  ? Hyperlipidemia 11/27/2020  ? Pleural effusion, left  09/06/2021  ? Sleep disorder 11/10/2018  ? ? ?ALLERGIES:  is allergic to oxaliplatin. ? ?MEDICATIONS:  ?VITAL SIGNS: ?BP 113/82   Pulse (!) 122   Resp 20   Wt 210 lb 3.2 oz (95.3 kg)   SpO2 98%   BMI 29.32 kg/m?  ?Filed Weights  ? 11/28/21 1106  ?Weight: 210 lb 3.2 oz (95.3 kg)  ? ?  ?Estimated body mass index is 29.32 kg/m? as calculated from the following: ?  Height as of 11/15/21: '5\' 11"'$  (1.803 m). ?  Weight as of this encounter: 210 lb 3.2 oz (95.3 kg). ? ? ?PERFORMANCE STATUS (ECOG) : 1 - Symptomatic but completely ambulatory ? ? ?Physical Exam ?General: NAD ?Cardio: RRR ?Resp: Normal breathing pattern  ?GI: soft, nontender, G-tube in place connected to bag ?Neurological: AAOx4 ? ?IMPRESSION: ? ?Ricardo [name] presents to clinic today for symptom management follow-up. He was seen on 11/27/22 and received IVF and antiemetics for intractable nausea and vomiting. He reports feeling much better over the past 24-48 hrs with only 1-2 episodes of vomiting. States he can tell when his stomach becomes full causing him to be nauseated. This occurs when he is drinking or eating in large volumes. Education provided on sipping drinks overtime versus large amounts to prevent episodes. He verbalized understanding.  ? ?Is able to drink and says he has been eating a lot of ice pops/anything of liquid content. Fatigue is improving given decreased symptoms. Is hopeful he will continue to do well with no nausea and vomiting.  ? ?He is scheduled for treatment and follow-up with Dr. Burr Medico and myself on 12/04/2021.  We will plan to keep appointment as scheduled as discussed with Dr. Burr Medico. Ricardo [name] is aware if symptoms return to contact our office for earlier visit.  ? ?He is looking forward to visiting his brother this upcoming weekend and hopeful he will continue to feel well.  ? ?We will provide ongoing support today with 1L Normal saline and further anti-emetics.  ? ? ?Abdominal pain related to neoplasm ?Pain is well controlled on current  regimen.  States he must follow regimen around the clock and stay ahead of his pain and discomfort.Current regimen: 200 mcg of fentanyl patch  ?Dilaudid 8 mg every 3-4 hours as needed for breakthrough pain. Tolerating well. Does not have to take every 4 hours. At times able to go for longer periods.  ?Cymbalta 30 mg daily  ?             ?2.    Nausea/vomiting ?Nausea and vomiting has improved over the past 24-48 hours. Will receive 1L IVF, Reglan, ativan, zofran during visit today for symptom management and hopes of avoiding hospitalization.Continue home regimen: ?Compazine 10 mg as needed for nausea ?Pantoprazole 40 mg daily.   ?Decadron '4mg'$  twice daily  ?Octreotide has been added to his daily TPN in the home ?    ?3. Anxiety ?Ativan 0.'5mg'$  every 6 hours as needed for nausea and anxiety.  ? ?4.  Constipation ?Patient reports he has had 2-3 bowel movements this week which is an improvement.  Denies diarrhea or constipation. Venting G-tube is draining to catheter bag.  ? ? ?PLAN: ?IVF, antiemetics in clinic today for intractable nausea and vomiting.  ?Ongoing support and close evaluation of effective symptom management.  Goal is to keep him out of the hospital and improve quality of life. This has been much improved from previous months.  ?Feeling better over the past 24-48hrs. Will plan to keep regular scheduled appointment next week. Will call sooner if needed.  ?Discussed with Dr. Burr Medico. Will obtain scans next week if symptoms persist.  ? ?Patient expressed understanding and was in agreement with this plan. He also understands that He can call the clinic at any time with any questions, concerns, or complaints.  ? ?Time Total: 40 min  ? ?Visit consisted of counseling and education dealing with the complex and emotionally intense issues of symptom management and palliative care in the setting of serious and potentially life-threatening illness.Greater than 50%  of this time was spent counseling and coordinating care  related to the above assessment and plan. ? ?Alda Lea, AGPCNP-BC  ?[place] ? ? ? ? ? ?  ?

## 2021-12-01 ENCOUNTER — Other Ambulatory Visit: Payer: Self-pay | Admitting: Hematology

## 2021-12-03 ENCOUNTER — Telehealth: Payer: Self-pay

## 2021-12-03 MED FILL — Fosaprepitant Dimeglumine For IV Infusion 150 MG (Base Eq): INTRAVENOUS | Qty: 5 | Status: AC

## 2021-12-03 NOTE — Telephone Encounter (Signed)
Pt is admitted at [facility] (T: [id number]) and spoke with Amy, RN assigned to Mr. Bona on [facility] Surgical.  Pt admitted d/t complications with PEG which was replaced and then accidentally pulled out by pt.  New PEG was placed again today 12/03/2021 and pt will be d/c home on 12/04/2021.  Pt will get TPN this evening per Amy, RN.  Amy, RN will fax this RN pt's d/c summary and the hospitalist last encounter note once pt is d/c home.  Notified Dr. Burr Medico and Jobe Gibbon, NP of the pt's hospital admission.   ?

## 2021-12-04 ENCOUNTER — Inpatient Hospital Stay: Payer: BC Managed Care – PPO | Admitting: Hematology

## 2021-12-04 ENCOUNTER — Telehealth: Payer: Self-pay | Admitting: Hematology

## 2021-12-04 ENCOUNTER — Inpatient Hospital Stay: Payer: BC Managed Care – PPO | Admitting: Nurse Practitioner

## 2021-12-04 ENCOUNTER — Inpatient Hospital Stay: Payer: BC Managed Care – PPO

## 2021-12-04 NOTE — Telephone Encounter (Signed)
.  Called patient to schedule appointment per 3/23 inbasket, patient wife  is aware of date and time.   ?

## 2021-12-05 ENCOUNTER — Other Ambulatory Visit: Payer: Self-pay

## 2021-12-05 ENCOUNTER — Emergency Department (HOSPITAL_COMMUNITY): Payer: BC Managed Care – PPO

## 2021-12-05 ENCOUNTER — Other Ambulatory Visit: Payer: Self-pay | Admitting: Nurse Practitioner

## 2021-12-05 ENCOUNTER — Encounter (HOSPITAL_COMMUNITY): Payer: Self-pay | Admitting: Emergency Medicine

## 2021-12-05 ENCOUNTER — Emergency Department (HOSPITAL_COMMUNITY)
Admission: EM | Admit: 2021-12-05 | Discharge: 2021-12-05 | Disposition: A | Discharge status: Home / Self Care | Payer: BC Managed Care – PPO | Attending: Emergency Medicine | Admitting: Emergency Medicine

## 2021-12-05 DIAGNOSIS — Z7901 Long term (current) use of anticoagulants: Secondary | ICD-10-CM | POA: Diagnosis not present

## 2021-12-05 DIAGNOSIS — K9423 Gastrostomy malfunction: Secondary | ICD-10-CM | POA: Diagnosis not present

## 2021-12-05 DIAGNOSIS — Z85038 Personal history of other malignant neoplasm of large intestine: Secondary | ICD-10-CM | POA: Insufficient documentation

## 2021-12-05 DIAGNOSIS — I959 Hypotension, unspecified: Secondary | ICD-10-CM | POA: Insufficient documentation

## 2021-12-05 DIAGNOSIS — Z931 Gastrostomy status: Secondary | ICD-10-CM

## 2021-12-05 DIAGNOSIS — N289 Disorder of kidney and ureter, unspecified: Secondary | ICD-10-CM

## 2021-12-05 DIAGNOSIS — R000 Tachycardia, unspecified: Secondary | ICD-10-CM | POA: Insufficient documentation

## 2021-12-05 DIAGNOSIS — E860 Dehydration: Secondary | ICD-10-CM | POA: Insufficient documentation

## 2021-12-05 LAB — CBC WITH DIFFERENTIAL/PLATELET
Abs Immature Granulocytes: 0.48 10*3/uL — ABNORMAL HIGH (ref 0.00–0.07)
Basophils Absolute: 0.1 10*3/uL (ref 0.0–0.1)
Basophils Relative: 1 %
Eosinophils Absolute: 0 10*3/uL (ref 0.0–0.5)
Eosinophils Relative: 0 %
HCT: 41.9 % (ref 39.0–52.0)
Hemoglobin: 13.7 g/dL (ref 13.0–17.0)
Immature Granulocytes: 3 %
Lymphocytes Relative: 10 %
Lymphs Abs: 1.5 10*3/uL (ref 0.7–4.0)
MCH: 28.5 pg (ref 26.0–34.0)
MCHC: 32.7 g/dL (ref 30.0–36.0)
MCV: 87.3 fL (ref 80.0–100.0)
Monocytes Absolute: 1.4 10*3/uL — ABNORMAL HIGH (ref 0.1–1.0)
Monocytes Relative: 9 %
Neutro Abs: 11.7 10*3/uL — ABNORMAL HIGH (ref 1.7–7.7)
Neutrophils Relative %: 77 %
Platelets: 431 10*3/uL — ABNORMAL HIGH (ref 150–400)
RBC: 4.8 MIL/uL (ref 4.22–5.81)
RDW: 20.4 % — ABNORMAL HIGH (ref 11.5–15.5)
WBC: 15.3 10*3/uL — ABNORMAL HIGH (ref 4.0–10.5)
nRBC: 0 % (ref 0.0–0.2)

## 2021-12-05 LAB — COMPREHENSIVE METABOLIC PANEL
ALT: 24 U/L (ref 0–44)
AST: 23 U/L (ref 15–41)
Albumin: 2.9 g/dL — ABNORMAL LOW (ref 3.5–5.0)
Alkaline Phosphatase: 165 U/L — ABNORMAL HIGH (ref 38–126)
Anion gap: 12 (ref 5–15)
BUN: 49 mg/dL — ABNORMAL HIGH (ref 6–20)
CO2: 25 mmol/L (ref 22–32)
Calcium: 8.8 mg/dL — ABNORMAL LOW (ref 8.9–10.3)
Chloride: 99 mmol/L (ref 98–111)
Creatinine, Ser: 1.43 mg/dL — ABNORMAL HIGH (ref 0.61–1.24)
GFR, Estimated: 58 mL/min — ABNORMAL LOW (ref >60)
Glucose, Bld: 101 mg/dL — ABNORMAL HIGH (ref 70–99)
Potassium: 3.3 mmol/L — ABNORMAL LOW (ref 3.5–5.1)
Sodium: 136 mmol/L (ref 135–145)
Total Bilirubin: 0.7 mg/dL (ref 0.3–1.2)
Total Protein: 8.2 g/dL — ABNORMAL HIGH (ref 6.5–8.1)

## 2021-12-05 LAB — LACTIC ACID, PLASMA: Lactic Acid, Venous: 1.3 mmol/L (ref 0.5–1.9)

## 2021-12-05 LAB — LIPASE, BLOOD: Lipase: 36 U/L (ref 11–51)

## 2021-12-05 MED ORDER — IOHEXOL 300 MG/ML  SOLN
30.0000 mL | Freq: Once | INTRAMUSCULAR | Status: AC | PRN
  Administered 2021-12-05: 30 mL

## 2021-12-05 MED ORDER — SODIUM CHLORIDE 0.9 % IV BOLUS (SEPSIS)
1000.0000 mL | Freq: Once | INTRAVENOUS | Status: AC
  Administered 2021-12-05: 1000 mL via INTRAVENOUS

## 2021-12-05 MED ORDER — DULOXETINE HCL 30 MG PO CPEP: 30.0000 mg | ORAL_CAPSULE | Freq: Every day | ORAL | 0 refills | Status: DC

## 2021-12-05 MED ORDER — HEPARIN SOD (PORK) LOCK FLUSH 100 UNIT/ML IV SOLN
INTRAVENOUS | Status: AC
  Administered 2021-12-05: 100 [IU]
  Filled 2021-12-05: qty 5

## 2021-12-05 MED ORDER — ALTEPLASE 2 MG IJ SOLR
2.0000 mg | Freq: Once | INTRAMUSCULAR | Status: AC
  Administered 2021-12-05: 2 mg
  Filled 2021-12-05: qty 2

## 2021-12-05 MED ORDER — SODIUM CHLORIDE 0.9 % IV SOLN: 1000.0000 mL | INTRAVENOUS | Status: DC

## 2021-12-05 MED ORDER — DEXAMETHASONE 4 MG PO TABS: 4.0000 mg | ORAL_TABLET | Freq: Every day | ORAL | 0 refills | Status: AC

## 2021-12-05 MED ORDER — SODIUM CHLORIDE 0.9 % IV BOLUS
1000.0000 mL | Freq: Once | INTRAVENOUS | Status: AC
  Administered 2021-12-05: 1000 mL via INTRAVENOUS

## 2021-12-05 NOTE — ED Provider Notes (Signed)
?[facility] DEPT ?Provider Note ? ? ?CSN: [id number] ?Arrival date & time: 12/05/21  1245 ? ?  ? ?History ? ?Chief Complaint  ?Patient presents with  ? Dehydration  ? ? ?Ricardo [name] is a 56 y.o. male. ? ?HPI ? ?Patient has a history of metastatic colon cancer, pleural effusions, GERD, hyperlipidemia.  Patient is undergoing chemotherapy.  Patient has had issues with Recurrent dehydration.  Patient has been receiving IV fluids and TPN at home.  He has a feeding tube that is being used for gastric decompression and drainage. ? ?Patient was recently admitted to the hospital at another [facility] when they went to the beach.  Patient was noted to be dehydrated and he was having issues with his gastric tube that had to be replaced.  Family states that while he was in the hospital he was not receiving his TPN.  They only started giving him TPN the last night that he was there. ? ?Patient feels like he is dehydrated.  His mouth is dry.  They have also noticed some drainage coming from around the feeding tube although the gastric tube does seem to be draining appropriately.  No fevers.  No increasing abdominal pain.  They called the cancer center was instructed to come to the ED ? ?Home Medications ?Prior to Admission medications   ?Medication Sig Start Date End Date Taking? Authorizing Provider  ?dexamethasone (DECADRON) 4 MG tablet Take 1 tablet (4 mg total) by mouth daily for 10 days. 12/05/21 12/15/21  Pickenpack-Cousar, [name], NP  ?DULoxetine (CYMBALTA) 30 MG capsule Take 1 capsule (30 mg total) by mouth daily. 12/05/21   Pickenpack-Cousar, [name], NP  ?fentaNYL (DURAGESIC) 100 MCG/HR Place 2 patches onto the skin every 3 (three) days. Along with 50 MCG/HR patch for a total of 250 MCG/HR dose. 11/26/21 12/26/21  Pickenpack-Cousar, [name], NP  ?fentaNYL (DURAGESIC) 50 MCG/HR Place 1 patch onto the skin every 3 (three) days. Along with 2 of 100 MCG/HR patch for a total of 250 MCG/HR  dose. 11/26/21   Pickenpack-Cousar, [name], NP  ?HYDROmorphone (DILAUDID) 4 MG tablet Take 2 tablets (8 mg total) by mouth every 4 (four) hours as needed for severe pain (Take 2 tablets ('8mg'$ ) every 3-4 hourse as needed for severe pain.). 10/21/21   Pickenpack-Cousar, [name], NP  ?HYDROmorphone (DILAUDID) 4 MG tablet Take 2 tablets (8 mg total) by mouth every 3 (three) hours as needed for severe pain or moderate pain. Take 2 tablets ('8mg'$ ) every 3-4 hours as needed for breakthrough pain. 11/20/21   [name], MD  ?LORazepam (ATIVAN) 0.5 MG tablet Take 1 tablet (0.5 mg total) by mouth every 6 (six) hours as needed for anxiety. 11/13/21 11/13/22  Pickenpack-Cousar, [name], NP  ?naloxone [facility]) nasal spray 4 mg/0.1 mL Use 1 spray for opioid overdose ?Patient taking differently: 0.4 mg once as needed (opioid overdose). 09/12/21   Debbe Odea, MD  ?Nutritional Supplements (FEEDING SUPPLEMENT, BOOST BREEZE,) LIQD Take 1 each by mouth 3 (three) times daily before meals. 10/14/21   Lavina Hamman, MD  ?pantoprazole sodium (PROTONIX) 40 mg Place 40 mg into feeding tube daily. 10/16/21   [name], MD  ?polyethylene glycol (MIRALAX / GLYCOLAX) 17 g packet Take 17 g by mouth daily as needed for mild constipation.    [provider]  ?prochlorperazine (COMPAZINE) 10 MG tablet Take 1 tablet (10 mg total) by mouth every 6 (six) hours as needed for nausea or vomiting. 09/26/21  [name], MD  ?promethazine (PHENERGAN) 25 MG suppository Place 1 suppository (25 mg total) rectally every 6 (six) hours as needed for nausea or vomiting. 11/24/21   Molpus, Jenny Reichmann, MD  ?RIVAROXABAN [employer]) VTE STARTER PACK (15 & 20 MG) Follow package directions: Take one '15mg'$  tablet by mouth twice a day. On day 22, switch to one '20mg'$  tablet once a day. Take with food. 11/10/21   [name], MD  ?   ? ?Allergies    ?Oxaliplatin   ? ?Review of Systems   ?Review of Systems  ?Constitutional:  Negative for fever.  ? ?Physical Exam ?Updated Vital Signs ?BP  95/81 (BP Location: Right Arm)   Pulse (!) 124   Temp (!) 97.4 ?F (36.3 ?C) (Oral)   Resp 18   Ht 1.803 m ('5\' 11"'$ )   Wt 94.3 kg   SpO2 95%   BMI 29.01 kg/m?  ?Physical Exam ?Vitals and nursing note reviewed.  ?Constitutional:   ?   Appearance: He is well-developed. He is not diaphoretic.  ?HENT:  ?   Head: Normocephalic and atraumatic.  ?   Right Ear: External ear normal.  ?   Left Ear: External ear normal.  ?   Mouth/Throat:  ?   Mouth: Mucous membranes are dry.  ?   Comments: Mucous membranes dry ?Eyes:  ?   General: No scleral icterus.    ?   Right eye: No discharge.     ?   Left eye: No discharge.  ?   Conjunctiva/sclera: Conjunctivae normal.  ?Neck:  ?   Trachea: No tracheal deviation.  ?Cardiovascular:  ?   Rate and Rhythm: Normal rate and regular rhythm.  ?Pulmonary:  ?   Effort: Pulmonary effort is normal. No respiratory distress.  ?   Breath sounds: Normal breath sounds. No stridor. No wheezing or rales.  ?Abdominal:  ?   General: Bowel sounds are normal. There is no distension.  ?   Palpations: Abdomen is soft.  ?   Tenderness: There is no abdominal tenderness. There is no guarding or rebound.  ?   Comments: Feeding tube in place, draining clear fluid, small amount of drainage around the tube site but no signs of erythema, no tenderness  ?Musculoskeletal:     ?   General: No tenderness or deformity.  ?   Cervical back: Neck supple.  ?Skin: ?   General: Skin is warm and dry.  ?   Findings: No rash.  ?Neurological:  ?   General: No focal deficit present.  ?   Mental Status: He is alert.  ?   Cranial Nerves: No cranial nerve deficit (no facial droop, extraocular movements intact, no slurred speech).  ?   Sensory: No sensory deficit.  ?   Motor: No abnormal muscle tone or seizure activity.  ?   Coordination: Coordination normal.  ?Psychiatric:     ?   Mood and Affect: Mood normal.  ? ? ?ED Results / Procedures / Treatments   ?Labs ?(all labs ordered are listed, but only abnormal results are  displayed) ?Labs Reviewed  ?CBC WITH DIFFERENTIAL/PLATELET  ?COMPREHENSIVE METABOLIC PANEL  ?LIPASE, BLOOD  ? ? ?EKG ?EKG Interpretation ? ?Date/Time:  Friday December 05 2021 13:15:38 EDT ?Ventricular Rate:  122 ?PR Interval:  156 ?QRS Duration: 91 ?QT Interval:  322 ?QTC Calculation: 459 ?R Axis:   67 ?Text Interpretation: Sinus tachycardia Low voltage, precordial leads Abnormal inferior Q waves Since last tracing rate faster Confirmed by Dorie Rank [phone])  on 12/05/2021 1:24:44 PM ? ?Radiology ?No results found. ? ?Procedures ?Procedures  ? ? ?Medications Ordered in ED ?Medications  ?sodium chloride 0.9 % bolus 1,000 mL (has no administration in time range)  ?  Followed by  ?0.9 %  sodium chloride infusion (has no administration in time range)  ? ? ?ED Course/ Medical Decision Making/ A&P ?Clinical Course as of 12/06/21 0659  ?Fri Dec 05, 2021  ?1628 DG Abdomen 1 View ?Gastrostomy tube appears to be appropriate located based on x-ray [JK]  ?  ?Clinical Course User Index ?[JK] Dorie Rank, MD  ? ?                        ?Medical Decision Making ?Amount and/or Complexity of Data Reviewed ?Labs: ordered. ?Radiology: ordered. Decision-making details documented in ED Course. ? ?Risk ?Prescription drug management. ? ? ?Patient presented to the ED for evaluation of concerns for dehydration.  Patient has complicated history of colon cancer that has caused an obstruction.  Patient has a gastrostomy tube for decompression purposes.  He is receiving his nutrition through IV fluids and TPN.  Patient recently hospitalized at another medical facility.  Family and patient states that he was not receiving his TPN although was receiving IV fluids.  On exam patient appears clinically dehydrated.  Noted to initially be hypotensive and tachycardic.  Suspect dehydration, concerns for sepsis infection less likely.  Gastrostomy tube does not appear infected.  Due to appears to be appropriate place on x-ray.  Labs were ordered but were  pending at the time of shift change.  Plan was to continue IV fluids and follow-up on laboratory test to determine disposition.  Care turned over to Dr. Darl Householder  ? ? ? ? ? ? ? ?Final Clinical Impression(s) / ED Diagnoses ?pending

## 2021-12-05 NOTE — Discharge Instructions (Signed)
Your G-tube is currently in place.  ? ?Please continue TPN at home ? ?Stay hydrated. ? ?See your doctor for follow-up.  If you have issues with your G-tube, please talk to your GI doctor ? ?Return to ER if you have worse abdominal pain, vomiting, leakage around the G-tube. ?

## 2021-12-05 NOTE — ED Triage Notes (Signed)
Patient reports dehydration, n/v x2 weeks. Patient had G-tube replacement 2 days ago. He reports leaking around the tube, and continued dehydration. Hx stage 4 colon cancer. ?

## 2021-12-05 NOTE — Progress Notes (Signed)
I was notified that Mr. Ricardo [name] was in the lobby requesting to see our office, as he did not have a scheduled appt with [place] today. I went to the front and met with him and his wife. He explained that he was recently admitted to [facility] in [place] due to his G-tube being misplaced and a new one was placed. He stated that it is currently leaking and was leaking when he left the hospital.  ?Per Ricardo Baton, NP we would advise for him to go to the ED to have this evaluated. Understanding verbalized. ?Mr. Ricardo [name] also had questions about Xarelto and Cymbalta. Per Ricardo Baton, NP, he was advised to continue the Xarelto until meeting with Dr. Burr Medico on Friday and a refill was sent in for Cymbalta. She also refilled his Dexamethasone for one week, and will reevaluate at next office visit.  ?Mr. Ricardo [name] is aware that he has a scheduled appt with Dr. Burr Medico and our office next Friday. Due to recent hospitalization and concerns, we will plan to see him on Tuesday in addition to his Friday appts for symptom management and follow up. Understanding verbalized.  ?

## 2021-12-05 NOTE — ED Provider Notes (Signed)
?  Physical Exam  ?BP (!) 127/93 (BP Location: Left Arm)   Pulse 96   Temp 97.7 ?F (36.5 ?C) (Oral)   Resp 12   Ht '5\' 11"'$  (1.803 m)   Wt 94.3 kg   SpO2 95%   BMI 29.01 kg/m?  ? ?Physical Exam ? ?Procedures  ?Procedures ? ?ED Course / MDM  ? ?Clinical Course as of 12/05/21 2009  ?Fri Dec 05, 2021  ?1628 DG Abdomen 1 View ?Gastrostomy tube appears to be appropriate located based on x-ray [JK]  ?  ?Clinical Course User Index ?[JK] Dorie Rank, MD  ? ?Medical Decision Making ?Patient care assumed at 5 PM.  Patient had a recent G-tube replacement at another hospital.  Patient just came home yesterday and just started on TPN. Patient has some leakage around the G-tube site and dehydration so family wanted him to get checked out. ? ?8:09 PM ?X-ray confirmed G-tube placement.  Labs showed creatinine of 1.5.  His baseline creatinine is around 0.6.  However he was told during his last hospitalization stay that he has AKI.  I was unable to review his records from the outside hospital.  I offered him admission for IV fluids and he refused.  He states that he should have more TPN at home and wants to go home.  I told him that his G-tube can be used and he can follow-up with his GI doctor if there is any issue with G-tube ? ?Problems Addressed: ?Dehydration: acute illness or injury ?Gastrointestinal tube present [facility]): acute illness or injury ?Renal insufficiency: acute illness or injury ? ?Amount and/or Complexity of Data Reviewed ?Labs: ordered. ?Radiology: ordered. Decision-making details documented in ED Course. ? ?Risk ?Prescription drug management. ? ? ? ? ? ? ? ?  ?Drenda Freeze, MD ?12/05/21 2011 ? ?

## 2021-12-05 NOTE — ED Notes (Signed)
IV team at bedside. Port flushed, blood return noted. Labs and fluids started.  ?

## 2021-12-06 ENCOUNTER — Inpatient Hospital Stay: Payer: BC Managed Care – PPO

## 2021-12-09 ENCOUNTER — Other Ambulatory Visit: Payer: Self-pay

## 2021-12-09 ENCOUNTER — Inpatient Hospital Stay (HOSPITAL_BASED_OUTPATIENT_CLINIC_OR_DEPARTMENT_OTHER): Payer: BC Managed Care – PPO | Admitting: Nurse Practitioner

## 2021-12-09 ENCOUNTER — Encounter: Payer: Self-pay | Admitting: Nurse Practitioner

## 2021-12-09 ENCOUNTER — Other Ambulatory Visit (HOSPITAL_COMMUNITY): Payer: Self-pay

## 2021-12-09 VITALS — BP 102/80 | HR 134 | Temp 98.0°F | Resp 18 | Ht 71.0 in | Wt 207.0 lb

## 2021-12-09 DIAGNOSIS — Z515 Encounter for palliative care: Secondary | ICD-10-CM | POA: Diagnosis not present

## 2021-12-09 DIAGNOSIS — R531 Weakness: Secondary | ICD-10-CM

## 2021-12-09 DIAGNOSIS — Z5111 Encounter for antineoplastic chemotherapy: Secondary | ICD-10-CM | POA: Diagnosis not present

## 2021-12-09 DIAGNOSIS — G893 Neoplasm related pain (acute) (chronic): Secondary | ICD-10-CM | POA: Diagnosis not present

## 2021-12-09 DIAGNOSIS — R110 Nausea: Secondary | ICD-10-CM

## 2021-12-09 DIAGNOSIS — R530 Neoplastic (malignant) related fatigue: Secondary | ICD-10-CM | POA: Diagnosis not present

## 2021-12-09 DIAGNOSIS — C786 Secondary malignant neoplasm of retroperitoneum and peritoneum: Secondary | ICD-10-CM | POA: Diagnosis not present

## 2021-12-09 DIAGNOSIS — L24A9 Irritant contact dermatitis due friction or contact with other specified body fluids: Secondary | ICD-10-CM

## 2021-12-09 MED ORDER — HYDROMORPHONE HCL 1 MG/ML PO LIQD
4.0000 mg | ORAL | 0 refills | Status: DC | PRN
  Filled 2021-12-09: qty 473, 25d supply, fill #0

## 2021-12-09 NOTE — Progress Notes (Signed)
? ?  ?Palliative Medicine ?[place]  ?Telephone:(336) [phone] [phone] ? ? ?Name: Ricardo [name] ?Date: 12/09/2021 ?MRN: [id number]  ?DOB: 1966/07/13 ? ?Patient Care Team: ?[name], MD as PCP - General (Internal Medicine) ?[name], MD as Consulting Physician (Oncology) ?[name], RN (Inactive) as Oncology Nurse Navigator ?Pickenpack-Cousar, [name], NP as Nurse Practitioner (Nurse Practitioner)  ?  ? ?INTERVAL HISTORY: ?DIAGO HAIK is a 56 y.o. 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 Patient recently hospitalized 12/14 and 12/20 for SBO which was improved by conservative measures. CT of abdomen pelvis showed small bowel obstruction with transition point likely in the mid abdomen. Venting G-tube was placed prior to discharge. Mr. Hymon was discharged on 09/13/2021 and required readmission on 09/14/2021 via EMS from home with similar concerns of abdominal pain, nausea, and vomiting. Imaging showes improvement in gaseous distention, however continues to have significant G-tube output, abdominal pain, nausea, and vomiting.  ? ?SOCIAL HISTORY:    ? reports that he has never smoked. He has never used smokeless tobacco. He reports that he does not currently use alcohol. He reports that he does not use drugs. ? ? ? ?PAST MEDICAL HISTORY: ?Past Medical History:  ?Diagnosis Date  ? Arthritis   ? Class 2 obesity 09/14/2021  ? Colon cancer ([place])   ? with metastasis  ? Family history of adverse reaction to anesthesia   ? mother had problem with it due to her asthma  ? Family history of breast cancer   ? Family history of prostate cancer   ? GAD (generalized anxiety disorder) 09/03/2021  ? GERD (gastroesophageal reflux disease) 09/03/2021  ? History of COVID-19 04/30/2021  ? Formatting of this note might be different from the original. 01/2021, asymptomatic  ? Hyperlipidemia 11/27/2020  ? Pleural effusion, left  09/06/2021  ? Sleep disorder 11/10/2018  ? ? ?ALLERGIES:  is allergic to oxaliplatin. ? ?MEDICATIONS:  ?VITAL SIGNS: ?BP 102/80 (BP Location: Left Arm, Patient Position: Sitting)   Pulse (!) 134   Temp 98 ?F (36.7 ?C) (Oral)   Resp 18   Ht '5\' 11"'$  (1.803 m)   Wt 207 lb (93.9 kg)   SpO2 96%   BMI 28.87 kg/m?  ?Filed Weights  ? 12/09/21 1043  ?Weight: 207 lb (93.9 kg)  ? ?  ?Estimated body mass index is 28.87 kg/m? as calculated from the following: ?  Height as of this encounter: '5\' 11"'$  (1.803 m). ?  Weight as of this encounter: 207 lb (93.9 kg). ? ? ?PERFORMANCE STATUS (ECOG) : 1 - Symptomatic but completely ambulatory ? ? ?Physical Exam ?General: Weak appearing ?Cardio: RRR ?Resp: Normal breathing pattern  ?GI: soft, tender, G-tube in place connected to bag, drainage noted, sutures at site, yellowish drainage around insertion site.  ?Skin: abdominal rash/irritation around G-tube area, tape irritation ?Neurological: AAOx4 ? ?IMPRESSION: ? ?Mr. Bencosme presents to clinic today for symptom management follow-up. He was recently admitted and discharged from [facility] while on vacation. He was treated for intractable nausea, vomiting, and subsequently his G-tube was dislodged requiring replacement x2 during hospitalization.  ? ?He was also evaluated on 12/05/21 at [facility]. CT showed tube was appropriately placed despite leakage around site. Patient declined admission.  ? ?On assessment sit continues to have moderate amount of leakage. Site cleansed and dressing was changed. Abdominal area tender and red rash/irritation due to content being against skin most likely. Also some noted  irritation around tape sites from previous hospitalization.  ? ?Mr. Lerette reports his nausea and vomiting is much improved. He is able to eat his usual popsicles. Wife reports ongoing weakness and fatigue. He is spending most of his time on the couch sleeping and/or resting. During our visit he is resting in recliner.  ? ?Bubba Hales  expressed concerns that he has not been receiving proper vitamins in his TPN due to a reported shortage by [employer]. I personally spoke with Apolonio Schneiders who reports patient is to received ordered amount of all additives in new TPN bag tomorrow. Also provided standing as needed orders for home IVF 1-2 times weekly (0.45% saline).  ? ?Mr. Melgoza reports he is unable to clamp off his G-tube for long periods of time as he has previously. He becomes nauseated and experiences pain after about 13mn. This poses concerns for both he and his wife that his pain is not being controlled and pills observed coming back out due to lack of time to digest. [place] hydromorphone solution however requested to follow-up for additional prescriptions for home use.  ? ?He is scheduled for follow-up on Friday with Dr. FBurr Medicoand possible treatment. They are aware depending on findings and recommendations per Dr. FBurr Medicohe may not have scheduled treatment. I will also plan to follow-up on Friday for ongoing support.  ? ?Abdominal pain related to neoplasm ?Pain was well controlled on current regimen however he is now unable to clamp off G-tube to allow for absorption of hydromorphone when taking.  We will switch hydromorphone over to liquid and continue to closely monitor.  ? ?Current regimen: ?Fentanyl patch 200 mcg ?Dilaudid 4-8 mg (3-666m solution every 3-4 hours as needed for breakthrough pain.  ?Cymbalta 30 mg daily  ?             ?2.    Nausea/vomiting ?Nausea and vomiting has improved.  ?Compazine 10 mg as needed for nausea ?Pantoprazole solu 40 mg daily.   ?Decadron '4mg'$  twice daily  ?Octreotide has been added to his daily TPN in the home ?Home IVF per [facility] needed-twice maximum 0.45% ?    ?3. Anxiety ?Ativan 0.'5mg'$  every 6 hours as needed for nausea and anxiety.  ? ?4.  Constipation ?Patient reports he is having bowel movements. Denies diarrhea or constipation. Venting G-tube  is draining to catheter bag. Leaking around site.  ? ?5. Abdominal skin irritation ?Khristopher's [place] [facility] [place] [facility] [place] [place] Per patient after reinsertion tube spontaneously dislodged and required a second re-insertion and sutures to stabilize. Since placement he has been leaking around site. Tube draining on assessment. Abdominal skin irritation noted with redness and rash. Sensitive to touch. Education provided on keeping skin clean and dry. Areas are related to bowel content irritating skin. Recommend using skin barrier and frequent dressing changes. Will continue to monitor.  ? ?PLAN: ?Hydromorphone solution 4-'8mg'$  every 3-4 hrs as needed for breakthrough pain. Unable to keep g-tube clam for extended period.  ?Skin barrier (zinc oxide) to abdominal area to prevent further contact dermatitis. Will closely monitor.  ?G-tube leaking. Site assessed and dressing changed. HaJarrett SohoRN contacted GI for follow-up appointment and further evaluation. Recent abdominal CT confirmed placement. May have to discuss further with IR for evaluation if no improvement.  ?Will return for scheduled appointment on Friday.  ?Ongoing support and close evaluation of effective symptom management.   ? ?Patient expressed understanding  and was in agreement with this plan. He also understands that He can call the clinic at any time with any questions, concerns, or complaints.  ? ?Time Total: 45 min ? ?Visit consisted of counseling and education dealing with the complex and emotionally intense issues of symptom management and palliative care in the setting of serious and potentially life-threatening illness.Greater than 50%  of this time was spent counseling and coordinating care related to the above assessment and plan. ? ?Alda Lea, AGPCNP-BC  ?[place] ? ? ? ? ? ? ? ?  ?

## 2021-12-10 ENCOUNTER — Other Ambulatory Visit (HOSPITAL_COMMUNITY): Payer: Self-pay

## 2021-12-10 ENCOUNTER — Telehealth: Payer: Self-pay

## 2021-12-10 ENCOUNTER — Encounter: Payer: Self-pay | Admitting: Hematology

## 2021-12-10 LAB — CULTURE, BLOOD (ROUTINE X 2): Culture: NO GROWTH

## 2021-12-10 NOTE — Telephone Encounter (Signed)
Ricardo [name] came to our office yesterday with complaints of a leaking G-tube with nausea and abdominal pain when clamping for small amounts of time. I reached out to [facility], who has previously seen Ricardo [name], to evaluate his G tube and rash around the site. They are able to schedule him on April 18th at 11:00am. We will also reach out to IR to see if they would be able to evaluate him sooner. Per [name], NP, they should place a skin barrier cream on his abdomen such as Desitin or A and D Rash Ointment. Ricardo [name], Ricardo [name]'s wife, notified. ?I also spoke with Ricardo [name] from [place] yesterday and gave verbal orders per [name], NP to add prn fluids for Ricardo [name] up to 2 times a week. Ricardo [name] stated that today Ricardo [name] is very weak and did have some vomiting last night. I advised for her to call [facility] to see if they can administer fluids for him today. Understanding verbalized. Advised to call our office with any questions/concerns.  ?

## 2021-12-11 ENCOUNTER — Other Ambulatory Visit (HOSPITAL_COMMUNITY): Payer: Self-pay

## 2021-12-11 ENCOUNTER — Telehealth: Payer: Self-pay

## 2021-12-11 ENCOUNTER — Other Ambulatory Visit: Payer: Self-pay

## 2021-12-11 DIAGNOSIS — C786 Secondary malignant neoplasm of retroperitoneum and peritoneum: Secondary | ICD-10-CM

## 2021-12-11 DIAGNOSIS — R110 Nausea: Secondary | ICD-10-CM

## 2021-12-11 NOTE — Progress Notes (Signed)
I spoke with IR at [facility] about Mr. Ricardo [name]'s G-tube leaking and recent replacement at [facility] while he was out of town. They stated that they could see him on April 3rd at 12pm to evaluate. Order placed. Notified wife of appointment.  ?

## 2021-12-11 NOTE — Telephone Encounter (Signed)
Our office completed a PA for Ricardo [name]'s Hydromorphone liquid and I reached out to [place] to make sure it went through. They were able to run it through based on a 25 day supply. I called Ricardo [name]'s wife, Ricardo [name], to notify her. Understanding and appreciation verbalized. All questions answered. Advised to call our office with any questions/concerns. ?She did give [place] an update-Ricardo [name] was able to receive fluids yesterday from [facility] and she stated that he was feeling a little better.  ?

## 2021-12-12 ENCOUNTER — Inpatient Hospital Stay: Payer: BC Managed Care – PPO

## 2021-12-12 ENCOUNTER — Encounter (HOSPITAL_COMMUNITY): Payer: Self-pay | Admitting: *Deleted

## 2021-12-12 ENCOUNTER — Inpatient Hospital Stay (HOSPITAL_BASED_OUTPATIENT_CLINIC_OR_DEPARTMENT_OTHER): Payer: BC Managed Care – PPO | Admitting: Hematology

## 2021-12-12 ENCOUNTER — Inpatient Hospital Stay: Payer: BC Managed Care – PPO | Admitting: Nurse Practitioner

## 2021-12-12 ENCOUNTER — Other Ambulatory Visit: Payer: Self-pay

## 2021-12-12 ENCOUNTER — Ambulatory Visit (HOSPITAL_COMMUNITY)
Admission: RE | Admit: 2021-12-12 | Discharge: 2021-12-12 | Disposition: A | Discharge status: Home / Self Care | Payer: BC Managed Care – PPO | Source: Ambulatory Visit | Attending: Nurse Practitioner | Admitting: Nurse Practitioner

## 2021-12-12 ENCOUNTER — Other Ambulatory Visit: Payer: Self-pay | Admitting: Nurse Practitioner

## 2021-12-12 ENCOUNTER — Ambulatory Visit: Payer: BC Managed Care – PPO | Admitting: Hematology

## 2021-12-12 ENCOUNTER — Other Ambulatory Visit: Payer: BC Managed Care – PPO

## 2021-12-12 VITALS — BP 129/98 | HR 117 | Temp 97.5°F | Resp 18

## 2021-12-12 VITALS — BP 92/71 | HR 119 | Resp 18 | Ht 71.0 in | Wt 203.0 lb

## 2021-12-12 DIAGNOSIS — C772 Secondary and unspecified malignant neoplasm of intra-abdominal lymph nodes: Secondary | ICD-10-CM

## 2021-12-12 DIAGNOSIS — C182 Malignant neoplasm of ascending colon: Secondary | ICD-10-CM

## 2021-12-12 DIAGNOSIS — R110 Nausea: Secondary | ICD-10-CM | POA: Insufficient documentation

## 2021-12-12 DIAGNOSIS — C786 Secondary malignant neoplasm of retroperitoneum and peritoneum: Secondary | ICD-10-CM

## 2021-12-12 DIAGNOSIS — Z95828 Presence of other vascular implants and grafts: Secondary | ICD-10-CM

## 2021-12-12 DIAGNOSIS — E860 Dehydration: Secondary | ICD-10-CM

## 2021-12-12 DIAGNOSIS — Z5111 Encounter for antineoplastic chemotherapy: Secondary | ICD-10-CM | POA: Diagnosis not present

## 2021-12-12 DIAGNOSIS — Z431 Encounter for attention to gastrostomy: Secondary | ICD-10-CM | POA: Insufficient documentation

## 2021-12-12 DIAGNOSIS — R112 Nausea with vomiting, unspecified: Secondary | ICD-10-CM

## 2021-12-12 HISTORY — PX: IR REPLC GASTRO/COLONIC TUBE PERCUT W/FLUORO: IMG2333

## 2021-12-12 LAB — CMP (CANCER CENTER ONLY)
ALT: 34 U/L (ref 0–44)
AST: 39 U/L (ref 15–41)
Albumin: 2.9 g/dL — ABNORMAL LOW (ref 3.5–5.0)
Alkaline Phosphatase: 153 U/L — ABNORMAL HIGH (ref 38–126)
Anion gap: 10 (ref 5–15)
BUN: 59 mg/dL — ABNORMAL HIGH (ref 6–20)
CO2: 35 mmol/L — ABNORMAL HIGH (ref 22–32)
Calcium: 8.9 mg/dL (ref 8.9–10.3)
Chloride: 111 mmol/L (ref 98–111)
Creatinine: 1.34 mg/dL — ABNORMAL HIGH (ref 0.61–1.24)
GFR, Estimated: >60 mL/min (ref >60)
Glucose, Bld: 116 mg/dL — ABNORMAL HIGH (ref 70–99)
Potassium: 3.3 mmol/L — ABNORMAL LOW (ref 3.5–5.1)
Sodium: 156 mmol/L — ABNORMAL HIGH (ref 135–145)
Total Bilirubin: 1.1 mg/dL (ref 0.3–1.2)
Total Protein: 8.2 g/dL — ABNORMAL HIGH (ref 6.5–8.1)

## 2021-12-12 LAB — CBC WITH DIFFERENTIAL (CANCER CENTER ONLY)
Abs Immature Granulocytes: 0.15 10*3/uL — ABNORMAL HIGH (ref 0.00–0.07)
Basophils Absolute: 0.1 10*3/uL (ref 0.0–0.1)
Basophils Relative: 0 %
Eosinophils Absolute: 0 10*3/uL (ref 0.0–0.5)
Eosinophils Relative: 0 %
HCT: 41.7 % (ref 39.0–52.0)
Hemoglobin: 13.1 g/dL (ref 13.0–17.0)
Immature Granulocytes: 1 %
Lymphocytes Relative: 15 %
Lymphs Abs: 1.8 10*3/uL (ref 0.7–4.0)
MCH: 28.2 pg (ref 26.0–34.0)
MCHC: 31.4 g/dL (ref 30.0–36.0)
MCV: 89.7 fL (ref 80.0–100.0)
Monocytes Absolute: 1.2 10*3/uL — ABNORMAL HIGH (ref 0.1–1.0)
Monocytes Relative: 10 %
Neutro Abs: 9.1 10*3/uL — ABNORMAL HIGH (ref 1.7–7.7)
Neutrophils Relative %: 74 %
Platelet Count: 313 10*3/uL (ref 150–400)
RBC: 4.65 MIL/uL (ref 4.22–5.81)
RDW: 20.5 % — ABNORMAL HIGH (ref 11.5–15.5)
WBC Count: 12.4 10*3/uL — ABNORMAL HIGH (ref 4.0–10.5)
nRBC: 0 % (ref 0.0–0.2)

## 2021-12-12 MED ORDER — IOHEXOL 300 MG/ML  SOLN
100.0000 mL | Freq: Once | INTRAMUSCULAR | Status: AC | PRN
  Administered 2021-12-12: 10 mL

## 2021-12-12 MED ORDER — SODIUM CHLORIDE 0.9% FLUSH
10.0000 mL | Freq: Once | INTRAVENOUS | Status: AC
  Administered 2021-12-12: 10 mL

## 2021-12-12 MED ORDER — SODIUM CHLORIDE 0.9 % IV SOLN: Freq: Once | INTRAVENOUS | Status: AC

## 2021-12-12 NOTE — Patient Instructions (Signed)

## 2021-12-12 NOTE — Progress Notes (Signed)
Orders given by [name], RN to run fluids over 1 hour so that patient can make his next appointment. Patient states understanding.  ?

## 2021-12-12 NOTE — Progress Notes (Signed)
?[place]   ?Telephone:(336) [phone] [phone]   ?Clinic Follow up Note  ? ?Patient Care Team: ?[name], MD as PCP - General (Internal Medicine) ?[name], MD as Consulting Physician (Oncology) ?[name], RN (Inactive) as Oncology Nurse Navigator ?Pickenpack-Cousar, [name], NP as Nurse Practitioner (Nurse Practitioner) ? ?Date of Service:  12/12/2021 ? ?CHIEF COMPLAINT: PEG tube leakage  ? ?CURRENT THERAPY:  ?First line FOLFOX q2 weeks starting 12/18/20.  ?-Bevacizumab added with cycle 2.  ?-Irinotecan added from C6 on 03/13/21.  ?-Oxaliplatin discontinued after C7 due to allergy reactions.  ?-restarted FOLFOXIRI on 09/18/2021 ? ?ASSESSMENT & PLAN:  ?Ricardo [name] is a 56 y.o. male with  ? ?1. G-tube dysfunction, Small bowel obstruction ?-he has had continued issues with his PEG-tube dysfunction [place]. He was seen in the ED at [facility] and had tube replaced on 12/03/21 ?-he has substantial leaking today and is unable to take in anything by mouth. I contacted IR Dr. Kathlene Cote at [facility], who will work him in today. Will hold on chemo today  ? ?2. 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 Work up showed mass in the terminal ileum with extensive omental nodularity and LN involvement. Baseline tumor marker CA 19-9 and CEA were normal. 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 This confirms stage IV metastatic cancer from cecum ?-He began first-line palliative chemo with FOLFOX on 12/18/20 ?-FO shows  KRAS/NRAS wildtype, Bevacizumab was added with cycle 2 ?-Treatment was escalated to [facility] with cycle 6 FOLFOX on 03/13/21,  He tolerated well ?-He underwent laparoscopy on 05/13/21 by Dr. Clovis Riley, unfortunately due to his extensive peritoneal metastasis he was not a candidate for cytoreductive surgery/HIPEC. ?-restaging CT 07/01/21 showed stable disease  ?-given recent SBO, I restarted chemotherapy FOLFOXFIRI On 09/18/21. He tolerated well but was readmitted for abdominal pain  ?-he recovered well from his hospitalization and restarted FOLFOXIRI on 10/22/21, he tolerated well overall  ?-due to #1, we will hold chemo today and proceed with IVF. ?  ?3. Symptom Management: Constipation, abdominal pain ?-he has been admitted several times in the last two months due to recurrent abdominal pain, episodes of nausea and vomiting, and small bowel obstruction. He required PEG placement for venting purposes. ?-He is on TPN, will continue  ?-f/u with clinic for pain management.  He is on high dose fentanyl patch and dilaudid  ? ?4. LUE DVT ?-noted to have thrombosed LUE veins during procedure to place tunneled PICC line. Doppler 11/10/21 showed acute DVT involving left subclavian, axillary, and brachial veins ?-he is now on Xarelto ?  ?5. Genetic testing ?-He has brother with prostate cancer and sister with breast cancer in their 58s. He is eligible for genetic testing.  ?-Negative result ?  ?6. Social Support ?-He is an avid cyclist. Last long distant riding was 06/2020 across [name].  ?-He is married with 1 adult son. His brother Jenny Reichmann is in town. He worked as a Designer, television/film set at [employer].  ?-He notes he has good family support, very close with his wife and  71 year old son who currently lives in [place]  ?  ?7.  Goals of care discussion, limited code  ?-Due to his peritoneal metastasis, he is not a candidate for cytoreductive surgery with HIPEC ?-He understands his cancer is metastatic, not curable, but still treatable ?-In  the end, he does not want to suffer.  He strongly desires to have a clear head to say goodbye to his wife and son when the time comes.   ?-He does not want prolonged life support  ?  ?  ?PLAN: ?-no chemo, proceed with IVF ?-he will go to [facility] IR for tube replacement today, I spoke with Dr. Kathlene Cote  ?-lab, flush, f/u and chemo next week  ?-we called [facility] home [facility] and faxed order of normal saline 500 to 1000 cc/day as needed to prevent dehydration ? ? ?No problem-specific Assessment & Plan notes found for this encounter. ? ? ?SUMMARY OF ONCOLOGIC HISTORY: ?Oncology History Overview Note  ?Cancer Staging ?metastatic cecal cancer ?Staging form: Colon and Rectum, AJCC 8th Edition ?- Clinical stage from 11/29/2020: Stage IVC (cTX, cN2, pM1c) - Signed by [name], MD on 12/04/2020 ?Stage prefix: Initial diagnosis ?Histologic grade (G): G3 ?Histologic grading system: 4 grade system ? ?  ?metastatic cecal cancer  ?11/27/2020 Imaging  ? CT Angio CAP  ?IMPRESSION: ?1. Thickening of the terminal ileum with associated proximal mid to ?distal small bowel obstruction. Findings could be due to an ileitis ?versus malignancy. Nonspecific mesenteric edema could be due to ?engorgement versus metastases. No associated bowel perforation. ?Recommend endoscopy for further evaluation. ?2. Indeterminate right lower quadrant lymphadenopathy. ?3. Scattered colonic diverticulosis with no acute diverticulitis. ?4. Stable right hepatic lobe subcentimeter hyperdensity likely ?represents a hepatic hemangioma. ?5. No acute vascular abnormality. Aortic Atherosclerosis ?(ICD10-I70.0) - mild. ?6. No acute intrathoracic abnormality. ?  ?11/28/2020 Imaging  ? CT AP  ?IMPRESSION: ?1. There is masslike, circumferential thickening of the terminal ?ileum and cecal base near the ileocecal valve and abnormally ?enlarged lymph nodes in the right lower quadrant mesentery adjacent ?to the terminal ileum measuring up to 2.3 x 1.4 cm. ?2. There is  extensive omental and peritoneal nodularity and caking ?throughout the abdomen. ?3. Findings are highly concerning for primary colon malignancy with ?nodal and peritoneal metastatic disease. ?4. Small volume perihepatic and perisplenic ascites. ?5. The small bowel is generally decompressed, with full some ?fluid-filled, nondistended loops throughout. There is transit of ?oral enteric contrast to the terminal ileum. No evidence of overt ?bowel obstruction at this time. Esophagogastric tube is position ?with tip and side port below the diaphragm. ?6. Atelectasis or consolidation of the dependent bilateral lung ?bases, new compared to prior examination. ?  ?Aortic Atherosclerosis (ICD10-I70.0). ?  ?  ?11/29/2020 Surgery  ? EXPLORATORY LAPAROTOMY, PARTIAL BOWEL RESECTION, POSSIBLE OSTOMY CREATION, PERITIONEAL BIOPSY, INSERTION OF GASTROSTOMY TUBE by Dr [name] ?  ?11/29/2020 Initial Biopsy  ? FINAL MICROSCOPIC DIAGNOSIS:  ? ?AB. 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  ? ? ?COMMENT:  ? ?Immunohistochemistry (IHC) for CK20 and CDX-2 is strong and diffusely  ?positive.  CK7, TTF-1, Synaptophysin, Chromogranin and CD56 are  ?negative.  The immunophenotype is compatible with origin from the lower  ?gastrointestinal tract.  IHC for MMR will be reported separately.  Case  ?preliminarily discussed with Dr. [name] on 12/02/2020.  ? ?At the request of Dr. [name], [phone] was reviewed in retrospect.  ?Review of the submitted sections confirms the presence of acute  ?  appendicitis.  No malignancy is identified.  ?  ?11/29/2020 Cancer Staging  ? Staging form: Colon and Rectum, AJCC 8th Edition ?- Clinical stage from 11/29/2020: Stage IVC (cTX, cN2, pM1c) - Signed by [name], MD on 12/04/2020 ?Stage prefix: Initial diagnosis ?Histologic grade (G): G3 ?Histologic grading system: 4 grade system ? ?  ?12/04/2020 Initial Diagnosis  ? Cancer of  ascending colon metastatic to intra-abdominal lymph node ([place]) ?  ? Chemotherapy  ? FOLFOX q2weeks  ?  ?12/18/2020 - 02/15/2021 Chemotherapy  ?  ? ? Patient is on Antibody Plan: COLORECTAL BEVACIZUMAB Q14D  ? ?  ?4/22/

## 2021-12-12 NOTE — Progress Notes (Signed)
[facility] Clinical Social Work  ?Initial Assessment ? ? ?Ricardo [name] is a 56 y.o. year old male accompanied by patient and wife. Clinical Social Work was referred by medical provider for assessment of psychosocial needs.  ? ?SDOH (Social Determinants of Health) assessments performed: Yes ?SDOH Interventions   ? ?Flowsheet Row Most Recent Value  ?SDOH Interventions   ?Financial Strain Interventions Development worker, community  ? ?  ?  ?Distress Screen completed: No ? ?  12/04/2020  ?  4:13 PM  ?ONCBCN DISTRESS SCREENING  ?Screening Type Initial Screening  ?Distress experienced in past week (1-10) 8  ?Emotional problem type Adjusting to illness  ?Information Concerns Type Lack of info about diagnosis;Lack of info about treatment  ?Physical Problem type Pain;Nausea/vomiting;Sleep/insomnia  ?Physician notified of physical symptoms Yes  ?Referral to clinical psychology No  ?Referral to clinical social work Yes  ?Referral to dietition Yes  ?Referral to financial advocate No  ?Referral to support programs No  ?Referral to palliative care No  ? ? ? ? ?Family/Social Information:  ?Housing Arrangement: patient lives with spouse.  ?Family members/support persons in your life? Family ?Transportation concerns: no  ?Employment: Out of work due to pain. Income source: Pt has been taking intermittent FMLA and his colleagues have been donating their sick days to support pt through this time; however, pt will now need to apply for long term disability.  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 ?Financial concerns: Yes, current concerns ?Type of concern: Medical bills ?Food access concerns: no ?Religious or spiritual practice: yes ?Services Currently in place:  [employer] provides TPN supplies ? ?Coping/ Adjustment to diagnosis: ?Patient understands treatment plan and what happens next? yes ?Concerns about diagnosis and/or treatment: How I will pay for the services I need ?Patient reported stressors: Adjusting to my  illness ?Hopes and priorities: Priority at present is to move forward with treatment w/ hopes of improvement in overall physical condition. ?Patient enjoys time with family/ friends ?Current coping skills/ strengths: Supportive family/friends  ? ? ? SUMMARY: ?Current SDOH Barriers:  ?Financial constraints related to co-pays and deductible for insurance.  Last year the couple sold their RV to cover the deductible, this year they do not have assets to sell. ? ?Clinical Social Work Clinical Goal(s):  ?patient will work with SW to address concerns related to finances ? ?Interventions: ?Discussed common feeling and emotions when being diagnosed with cancer, and the importance of support during treatment ?Informed patient of the support team roles and support services at [facility] ?Provided CSW contact information and encouraged patient to call with any questions or concerns ?Referred patient to [facility].  Provided pt/spouse with contact information for hospital financial resource specialist as well as contact information for two organizations which provided co-pay financial support. ? ? ?Follow Up Plan: Patient will contact CSW with any support or resource needs ?Patient verbalizes understanding of plan: Yes ? ? ? ?Henriette Combs, LCSW ?

## 2021-12-12 NOTE — Progress Notes (Deleted)
[facility] Clinical Social Work  ?Initial Assessment ? ? ?Ricardo [name] is a 56 y.o. year old male accompanied by patient and wife. Clinical Social Work was referred by medical provider for assessment of psychosocial needs.  ? ?SDOH (Social Determinants of Health) assessments performed: Yes ?  ?Distress Screen completed: No ? ?  12/04/2020  ?  4:13 PM  ?ONCBCN DISTRESS SCREENING  ?Screening Type Initial Screening  ?Distress experienced in past week (1-10) 8  ?Emotional problem type Adjusting to illness  ?Information Concerns Type Lack of info about diagnosis;Lack of info about treatment  ?Physical Problem type Pain;Nausea/vomiting;Sleep/insomnia  ?Physician notified of physical symptoms Yes  ?Referral to clinical psychology No  ?Referral to clinical social work Yes  ?Referral to dietition Yes  ?Referral to financial advocate No  ?Referral to support programs No  ?Referral to palliative care No  ? ? ? ? ?Family/Social Information:  ?Housing Arrangement: patient lives with spouse ?Family members/support persons in your life? Pt reports he has very good family support as well as support from his church and work ?Transportation concerns: no  ?Employment: Out on work excuse. Income source: Pt states he has been unable to work since December.  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  Spouse has also been taking intermittent FMLA.  Pt states he now needs to apply for long term disability as it does not appear he will be able to return to work. ?Financial concerns: Yes, due to illness and/or loss of work during treatment ?Type of concern: Medical bills ?Food access concerns: no ?Religious or spiritual practice: yes ?Services Currently in place:  [facility] for TPN and fluids ? ?Coping/ Adjustment to diagnosis: ?Patient understands treatment  plan and what happens next? yes ?Concerns about diagnosis and/or treatment:  Pt recently was [place] the [place].  Following this hospitalization pt states he is frustrated and discouraged. ?Patient reported stressors: Adjusting to my illness ?Hopes and priorities: Priority at this time is to continue treatment with the hope of improvement in overall condition ?Patient enjoys time with family/ friends ?Current coping skills/ strengths: Supportive family/friends  ? ? ? SUMMARY: ?Current SDOH Barriers:  ?Financial constraints related to co-pays and deductible ? ?Clinical Social Work Clinical Goal(s):  ?patient will work with SW to address concerns related to financial concerns. ? ?Interventions: ?Discussed common feeling and emotions when being diagnosed with cancer, and the importance of support during treatment ?Informed patient of the support team roles and support services at [facility] ?Provided CSW contact information and encouraged patient to call with any questions or concerns ?Referred patient to [facility].  Provided spouse with contact information for financial resources as well as 2 foundations which provide co-pay and grant support. ? ? ?Follow Up Plan: Patient will contact CSW with any support or resource needs ?Patient verbalizes understanding of plan: Yes ? ? ? ?Henriette Combs, LCSW ?

## 2021-12-15 ENCOUNTER — Encounter: Payer: Self-pay | Admitting: Hematology

## 2021-12-15 ENCOUNTER — Telehealth: Payer: Self-pay

## 2021-12-15 ENCOUNTER — Inpatient Hospital Stay: Payer: BC Managed Care – PPO

## 2021-12-15 ENCOUNTER — Other Ambulatory Visit: Payer: Self-pay

## 2021-12-15 ENCOUNTER — Other Ambulatory Visit (HOSPITAL_COMMUNITY): Payer: BC Managed Care – PPO

## 2021-12-15 MED ORDER — RIVAROXABAN 20 MG PO TABS: 20.0000 mg | ORAL_TABLET | Freq: Every day | ORAL | 0 refills | Status: DC

## 2021-12-15 NOTE — Progress Notes (Signed)
Received call from patient's spouse regarding financial assistance. ? ?She had concerns regarding combining account balance for [facility] and hospital side. Advised this would have to be done by calling the Customer Service billing department. She verbalized understanding. ? ?Discussed one-time $1000 Radio broadcast assistant to assist with personal expenses while going through treatment. Based on verbal income guidelines, her household exceeds the limit. She verbalized understanding. ? ?Advised if there is available copay assistance for his specific diagnosis/treatment, I will reach out to her and let her know. ? ?She has my contact information for any additional financial questions or concerns.  ?

## 2021-12-15 NOTE — Telephone Encounter (Signed)
Ricardo [name], Ricardo [name]'s wife, called our office this morning to inform us that Ricardo [name] has not been able to keep any fluids down and is vomiting after drinking anything. She also asked about his vitamins through [facility] and if we can increase his prn fluids to prn daily. She also asked about his Xarelto prescription. I relayed this information to [facility], NP who stated that we could increase his fluids to prn daily and that she would recommend for him to go to the ED if he is not able to hold anything down and continues to get weaker. I called Ricardo [name] from [facility] to change the order to prn daily and she explained that there was a Producer, television/film/video on the vitamins and the guidelines were to take 3 a week and that's what they've been giving him. We also spoke to Ricardo [name] who refilled his Xarelto prescription. I relayed all this information to Ricardo [name] who verbalized understanding and appreciation. She stated that she would talk to Ricardo [name] about going to the ED. Advised to call our office with any further questions/concerns.  ?

## 2021-12-15 NOTE — Progress Notes (Signed)
Verbal order from Dr. Burr Medico for Xarelto '20mg'$  PO daily.  Prescription sent to [facility]. ?

## 2021-12-16 ENCOUNTER — Telehealth: Payer: Self-pay | Admitting: Hematology

## 2021-12-16 NOTE — Progress Notes (Deleted)
?[facility]   ?[phone] [phone]   ?Clinic Follow up Note  ? ?Patient Care Team: ?[name], MD as PCP - General (Internal Medicine) ?[name], MD as Consulting Physician (Oncology) ?[name], RN (Inactive) as Oncology Nurse Navigator ?Pickenpack-Cousar, [name], NP as Nurse Practitioner (Nurse Practitioner) ?12/16/2021 ? ?CHIEF COMPLAINT:  ? ?SUMMARY OF ONCOLOGIC HISTORY: ?Oncology History Overview Note  ?Cancer Staging ?metastatic cecal cancer ?Staging form: Colon and Rectum, AJCC 8th Edition ?- Clinical stage from 11/29/2020: Stage IVC (cTX, cN2, pM1c) - Signed by [name], MD on 12/04/2020 ?Stage prefix: Initial diagnosis ?Histologic grade (G): G3 ?Histologic grading system: 4 grade system ? ?  ?metastatic cecal cancer  ?11/27/2020 Imaging  ? CT Angio CAP  ?IMPRESSION: ?1. Thickening of the terminal ileum with associated proximal mid to ?distal small bowel obstruction. Findings could be due to an ileitis ?versus malignancy. Nonspecific mesenteric edema could be due to ?engorgement versus metastases. No associated bowel perforation. ?Recommend endoscopy for further evaluation. ?2. Indeterminate right lower quadrant lymphadenopathy. ?3. Scattered colonic diverticulosis with no acute diverticulitis. ?4. Stable right hepatic lobe subcentimeter hyperdensity likely ?represents a hepatic hemangioma. ?5. No acute vascular abnormality. Aortic Atherosclerosis ?(ICD10-I70.0) - mild. ?6. No acute intrathoracic abnormality. ?  ?11/28/2020 Imaging  ? CT AP  ?IMPRESSION: ?1. There is masslike, circumferential thickening of the terminal ?ileum and cecal base near the ileocecal valve and abnormally ?enlarged lymph nodes in the right lower quadrant mesentery adjacent ?to the terminal ileum measuring up to 2.3 x 1.4 cm. ?2. There is extensive omental and peritoneal nodularity and caking ?throughout the abdomen. ?3. Findings are highly concerning for primary colon  malignancy with ?nodal and peritoneal metastatic disease. ?4. Small volume perihepatic and perisplenic ascites. ?5. The small bowel is generally decompressed, with full some ?fluid-filled, nondistended loops throughout. There is transit of ?oral enteric contrast to the terminal ileum. No evidence of overt ?bowel obstruction at this time. Esophagogastric tube is position ?with tip and side port below the diaphragm. ?6. Atelectasis or consolidation of the dependent bilateral lung ?bases, new compared to prior examination. ?  ?Aortic Atherosclerosis (ICD10-I70.0). ?  ?  ?11/29/2020 Surgery  ? EXPLORATORY LAPAROTOMY, PARTIAL BOWEL RESECTION, POSSIBLE OSTOMY CREATION, PERITIONEAL BIOPSY, INSERTION OF GASTROSTOMY TUBE by Dr [name] ?  ?11/29/2020 Initial Biopsy  ? FINAL MICROSCOPIC DIAGNOSIS:  ? ?AB. 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  ? ? ?COMMENT:  ? ?Immunohistochemistry (IHC) for CK20 and CDX-2 is strong and diffusely  ?positive.  CK7, TTF-1, Synaptophysin, Chromogranin and CD56 are  ?negative.  The immunophenotype is compatible with origin from the lower  ?gastrointestinal tract.  IHC for MMR will be reported separately.  Case  ?preliminarily discussed with Dr. [name] on 12/02/2020.  ? ?At the request of Dr. [name], [phone] was reviewed in retrospect.  ?Review of the submitted sections confirms the presence of acute  ?appendicitis.  No malignancy is identified.  ?  ?11/29/2020 Cancer Staging  ? Staging form: Colon and Rectum, AJCC 8th Edition ?- Clinical stage from 11/29/2020: Stage IVC (cTX, cN2, pM1c) - Signed by [name], MD on 12/04/2020 ?Stage prefix: Initial diagnosis ?Histologic grade (G): G3 ?Histologic grading system: 4 grade system ? ?  ?12/04/2020 Initial Diagnosis  ? 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  ? Chemotherapy  ? FOLFOX q2weeks  ?  ?12/18/2020 - 02/15/2021 Chemotherapy   ?  ? ?  Patient is on Antibody Plan: COLORECTAL BEVACIZUMAB Q14D  ? ?  ?01/03/2021 Imaging  ? CT A/P ?IMPRESSION: ?1. Wall thickening of the terminal ileum again seen. Fluid-filled ?distal small bowel, ascending, and transverse colon without ?obstruction. ?2. Equivocal improvement in omental and peritoneal metastatic ?disease with slightly improved small volume perihepatic and ?perisplenic ascites. Right lower quadrant mesenteric adenopathy is ?similar or mildly improved. ?3. Sigmoid colonic diverticulosis. Mild mural wall thickening in the ?sigmoid, no acute diverticulitis. ?4. Gastrostomy tube in place, balloon normally positioned in the ?stomach. ?5. Chronic bilateral L5 pars interarticularis defects with trace ?anterolisthesis of L5 on S1. 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 ?  ?01/16/2021 Genetic Testing  ? Negative genetic testing. CTNNA1 [id number] VUS, RAD51D p.G140E VUS and TSC1 p.R768H VUS found on the CancerNext-Expanded+RNAinsight.  The CancerNext-Expanded gene panel offered by [facility] and includes sequencing and rearrangement analysis for the following 77 genes: AIP, ALK, APC*, ATM*, AXIN2, BAP1, BARD1, BLM, BMPR1A, BRCA1*, BRCA2*, BRIP1*, CDC73, CDH1*, CDK4, CDKN1B, CDKN2A, CHEK2*, CTNNA1, DICER1, FANCC, FH, FLCN, GALNT12, KIF1B, LZTR1, MAX, MEN1, MET, MLH1*, MSH2*, MSH3, MSH6*, MUTYH*, NBN, NF1*, NF2, NTHL1, PALB2*, PHOX2B, PMS2*, POT1, PRKAR1A, PTCH1, PTEN*, RAD51C*, RAD51D*, RB1, RECQL, RET, SDHA, SDHAF2, SDHB, SDHC, SDHD, SMAD4, SMARCA4, SMARCB1, SMARCE1, STK11, SUFU, TMEM127, TP53*, TSC1, TSC2, VHL and XRCC2 (sequencing and deletion/duplication); EGFR, EGLN1, HOXB13, KIT, MITF, PDGFRA, POLD1, and POLE (sequencing only); EPCAM and GREM1 (deletion/duplication only). DNA and RNA analyses performed for * genes. The report date is Jan 16, 2021. ?  ?02/24/2021 Imaging  ? CT AP  ?IMPRESSION: ?1. Interval resolution of previously seen small volume ascites ?throughout the abdomen  and pelvis. There is redemonstrated, ?ill-defined stranding and thickening of the peritoneal surfaces and ?omentum, which is somewhat diminished compared to prior examination. ?2. Interval improvement in right lower quadrant mesocolon lymph ?nodes. ?3. Findings are consistent with treatment response of nodal and ?peritoneal metastatic disease. ?4. Unchanged, matted appearing thickening of the terminal ileum and ?cecal base. ?5. Sigmoid diverticulosis with wall thickening of the mid to distal ?sigmoid, similar to prior examination. Findings are most suggestive ?of chronic sequelae of diverticulitis. ?  ?Aortic Atherosclerosis (ICD10-I70.0). ?  ?03/13/2021 -  Chemotherapy  ? Patient is on Treatment Plan : COLORECTAL FOLFOXIRI + Bevacizumab q14d  ?   ?05/13/2021 Procedure  ? OPERATIVE PROCEDURE: Laparoscopy and peritoneal biopsy. ? ?SURGEON: Merlyn Albert. Clovis Riley, MD ?  ?05/13/2021 Pathology Results  ? Final Pathologic Diagnosis    ?A. PERITONEAL, BIOPSY : ?             Metastatic adenocarcinoma.  ?             See comment.   ? ?Comment    ?The biopsies show predominately fibrosis  and fibroadipose tissue with a small focus of moderately differentiated adenocarcinoma. Given the patient's history, the findings favor metastasis of the patient's known colonic cancer.  ? ?  ?07/01/2021 Imaging  ? CT CAP ? ?IMPRESSION: ?Chest Impression: ?  ?1. No evidence of thoracic metastasis. ?  ?Abdomen / Pelvis Impression: ?  ?1. Thickening through the terminal ileum similar to prior. No ?ileocecal lymphadenopathy. ?2. 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 ?3. No evidence of solid organ metastasis in the abdomen pelvis. ?  ? ? ?CURRENT THERAPY:  ?First line FOLFOX q2 weeks starting 12/18/20.  ?-Bevacizumab added  with cycle 2.  ?-Irinotecan added from C6 on  03/13/21.  ?-Oxaliplatin discontinued after C7 due to allergy reactions.  ?-restarted FOLFOXIRI on 09/18/2021 ? ?INTERVAL HISTORY: Mr. Ricardo [name] returns for follow-up and treatment as scheduled.  Last seen by Dr. Burr Medico

## 2021-12-16 NOTE — Telephone Encounter (Signed)
Unable to leave message with follow-up appointments per 3/31 los. Patient is Mychart active. ?

## 2021-12-18 ENCOUNTER — Inpatient Hospital Stay: Payer: BC Managed Care – PPO | Admitting: Nurse Practitioner

## 2021-12-18 ENCOUNTER — Other Ambulatory Visit: Payer: Self-pay

## 2021-12-18 ENCOUNTER — Inpatient Hospital Stay: Payer: BC Managed Care – PPO

## 2021-12-18 DIAGNOSIS — E860 Dehydration: Secondary | ICD-10-CM

## 2021-12-18 DIAGNOSIS — C786 Secondary malignant neoplasm of retroperitoneum and peritoneum: Secondary | ICD-10-CM

## 2021-12-19 ENCOUNTER — Encounter: Payer: Self-pay | Admitting: Hematology

## 2021-12-19 ENCOUNTER — Telehealth: Payer: Self-pay

## 2021-12-19 ENCOUNTER — Other Ambulatory Visit: Payer: Self-pay

## 2021-12-19 DIAGNOSIS — C786 Secondary malignant neoplasm of retroperitoneum and peritoneum: Secondary | ICD-10-CM

## 2021-12-19 NOTE — Telephone Encounter (Signed)
Ricardo [name], Mr. Ricardo [name]'s wife, called our office to update [place] on how he is doing with his TPN. She stated that they have changed the rate and caloric values. She stated that she gave it to him last night but that he began to have [facility] and felt like he was "suffocating". She stated that she then turned off the TPN and gave him fluids, which made him feel better. She spoke to [place], who manages his TPN, and she told her to try his TPN, but if he develops [facility], that she needs to stop it.  ?She also stated that he is having cramping in his legs and is having difficulty walking. I placed an order through [facility] to [place] for a [employer] Wheelchair. I reminded her that we would see them Monday for his scheduled appt. I advised her to call the ED over the weekend if he developed [facility] or chest pain that wouldn't go away. Understanding verbalized.  ?

## 2021-12-21 ENCOUNTER — Encounter (HOSPITAL_COMMUNITY): Payer: Self-pay | Admitting: Emergency Medicine

## 2021-12-21 ENCOUNTER — Other Ambulatory Visit: Payer: Self-pay

## 2021-12-21 ENCOUNTER — Inpatient Hospital Stay (HOSPITAL_COMMUNITY)
Admission: EM | Admit: 2021-12-21 | Discharge: 2021-12-24 | DRG: 682 | Disposition: A | Discharge status: Hospice | Payer: BC Managed Care – PPO | Attending: Family Medicine | Admitting: Family Medicine

## 2021-12-21 ENCOUNTER — Emergency Department (HOSPITAL_COMMUNITY): Payer: BC Managed Care – PPO

## 2021-12-21 DIAGNOSIS — Z803 Family history of malignant neoplasm of breast: Secondary | ICD-10-CM

## 2021-12-21 DIAGNOSIS — K219 Gastro-esophageal reflux disease without esophagitis: Secondary | ICD-10-CM | POA: Diagnosis present

## 2021-12-21 DIAGNOSIS — G479 Sleep disorder, unspecified: Secondary | ICD-10-CM | POA: Diagnosis present

## 2021-12-21 DIAGNOSIS — K56699 Other intestinal obstruction unspecified as to partial versus complete obstruction: Secondary | ICD-10-CM | POA: Diagnosis present

## 2021-12-21 DIAGNOSIS — Z6828 Body mass index (BMI) 28.0-28.9, adult: Secondary | ICD-10-CM

## 2021-12-21 DIAGNOSIS — Z85038 Personal history of other malignant neoplasm of large intestine: Secondary | ICD-10-CM

## 2021-12-21 DIAGNOSIS — E43 Unspecified severe protein-calorie malnutrition: Secondary | ICD-10-CM | POA: Diagnosis present

## 2021-12-21 DIAGNOSIS — E785 Hyperlipidemia, unspecified: Secondary | ICD-10-CM | POA: Diagnosis present

## 2021-12-21 DIAGNOSIS — Z515 Encounter for palliative care: Secondary | ICD-10-CM | POA: Diagnosis not present

## 2021-12-21 DIAGNOSIS — F411 Generalized anxiety disorder: Secondary | ICD-10-CM | POA: Diagnosis present

## 2021-12-21 DIAGNOSIS — K9423 Gastrostomy malfunction: Secondary | ICD-10-CM | POA: Diagnosis present

## 2021-12-21 DIAGNOSIS — K9429 Other complications of gastrostomy: Secondary | ICD-10-CM | POA: Diagnosis present

## 2021-12-21 DIAGNOSIS — E876 Hypokalemia: Secondary | ICD-10-CM | POA: Diagnosis present

## 2021-12-21 DIAGNOSIS — Z8616 Personal history of COVID-19: Secondary | ICD-10-CM

## 2021-12-21 DIAGNOSIS — Z9221 Personal history of antineoplastic chemotherapy: Secondary | ICD-10-CM

## 2021-12-21 DIAGNOSIS — C772 Secondary and unspecified malignant neoplasm of intra-abdominal lymph nodes: Secondary | ICD-10-CM | POA: Diagnosis present

## 2021-12-21 DIAGNOSIS — E8809 Other disorders of plasma-protein metabolism, not elsewhere classified: Secondary | ICD-10-CM | POA: Diagnosis present

## 2021-12-21 DIAGNOSIS — C180 Malignant neoplasm of cecum: Secondary | ICD-10-CM | POA: Diagnosis present

## 2021-12-21 DIAGNOSIS — G893 Neoplasm related pain (acute) (chronic): Secondary | ICD-10-CM | POA: Diagnosis present

## 2021-12-21 DIAGNOSIS — I82412 Acute embolism and thrombosis of left femoral vein: Secondary | ICD-10-CM | POA: Diagnosis present

## 2021-12-21 DIAGNOSIS — I959 Hypotension, unspecified: Secondary | ICD-10-CM | POA: Diagnosis present

## 2021-12-21 DIAGNOSIS — I82452 Acute embolism and thrombosis of left peroneal vein: Secondary | ICD-10-CM | POA: Diagnosis present

## 2021-12-21 DIAGNOSIS — R000 Tachycardia, unspecified: Secondary | ICD-10-CM | POA: Diagnosis present

## 2021-12-21 DIAGNOSIS — K56609 Unspecified intestinal obstruction, unspecified as to partial versus complete obstruction: Secondary | ICD-10-CM | POA: Diagnosis not present

## 2021-12-21 DIAGNOSIS — Z888 Allergy status to other drugs, medicaments and biological substances status: Secondary | ICD-10-CM

## 2021-12-21 DIAGNOSIS — C182 Malignant neoplasm of ascending colon: Secondary | ICD-10-CM | POA: Diagnosis present

## 2021-12-21 DIAGNOSIS — C786 Secondary malignant neoplasm of retroperitoneum and peritoneum: Secondary | ICD-10-CM | POA: Diagnosis present

## 2021-12-21 DIAGNOSIS — Z8042 Family history of malignant neoplasm of prostate: Secondary | ICD-10-CM

## 2021-12-21 DIAGNOSIS — E870 Hyperosmolality and hypernatremia: Secondary | ICD-10-CM | POA: Diagnosis present

## 2021-12-21 DIAGNOSIS — N179 Acute kidney failure, unspecified: Secondary | ICD-10-CM | POA: Diagnosis present

## 2021-12-21 DIAGNOSIS — Z800 Family history of malignant neoplasm of digestive organs: Secondary | ICD-10-CM | POA: Diagnosis not present

## 2021-12-21 DIAGNOSIS — D72829 Elevated white blood cell count, unspecified: Secondary | ICD-10-CM | POA: Diagnosis present

## 2021-12-21 DIAGNOSIS — Y838 Other surgical procedures as the cause of abnormal reaction of the patient, or of later complication, without mention of misadventure at the time of the procedure: Secondary | ICD-10-CM | POA: Diagnosis present

## 2021-12-21 DIAGNOSIS — Z66 Do not resuscitate: Secondary | ICD-10-CM | POA: Diagnosis present

## 2021-12-21 DIAGNOSIS — Z7901 Long term (current) use of anticoagulants: Secondary | ICD-10-CM

## 2021-12-21 DIAGNOSIS — M7989 Other specified soft tissue disorders: Secondary | ICD-10-CM | POA: Diagnosis not present

## 2021-12-21 DIAGNOSIS — Z79899 Other long term (current) drug therapy: Secondary | ICD-10-CM

## 2021-12-21 DIAGNOSIS — R112 Nausea with vomiting, unspecified: Secondary | ICD-10-CM | POA: Diagnosis present

## 2021-12-21 DIAGNOSIS — E860 Dehydration: Secondary | ICD-10-CM | POA: Diagnosis present

## 2021-12-21 DIAGNOSIS — E663 Overweight: Secondary | ICD-10-CM | POA: Diagnosis present

## 2021-12-21 DIAGNOSIS — D649 Anemia, unspecified: Secondary | ICD-10-CM | POA: Diagnosis present

## 2021-12-21 DIAGNOSIS — Z83438 Family history of other disorder of lipoprotein metabolism and other lipidemia: Secondary | ICD-10-CM

## 2021-12-21 DIAGNOSIS — I82432 Acute embolism and thrombosis of left popliteal vein: Secondary | ICD-10-CM | POA: Diagnosis present

## 2021-12-21 DIAGNOSIS — M1990 Unspecified osteoarthritis, unspecified site: Secondary | ICD-10-CM | POA: Diagnosis present

## 2021-12-21 DIAGNOSIS — I82402 Acute embolism and thrombosis of unspecified deep veins of left lower extremity: Secondary | ICD-10-CM

## 2021-12-21 DIAGNOSIS — I82442 Acute embolism and thrombosis of left tibial vein: Secondary | ICD-10-CM | POA: Diagnosis present

## 2021-12-21 LAB — CBC WITH DIFFERENTIAL/PLATELET
Abs Immature Granulocytes: 0.18 10*3/uL — ABNORMAL HIGH (ref 0.00–0.07)
Basophils Absolute: 0.1 10*3/uL (ref 0.0–0.1)
Basophils Relative: 0 %
Eosinophils Absolute: 0.1 10*3/uL (ref 0.0–0.5)
Eosinophils Relative: 1 %
HCT: 43.5 % (ref 39.0–52.0)
Hemoglobin: 13.1 g/dL (ref 13.0–17.0)
Immature Granulocytes: 1 %
Lymphocytes Relative: 7 %
Lymphs Abs: 1.3 10*3/uL (ref 0.7–4.0)
MCH: 28.4 pg (ref 26.0–34.0)
MCHC: 30.1 g/dL (ref 30.0–36.0)
MCV: 94.2 fL (ref 80.0–100.0)
Monocytes Absolute: 0.9 10*3/uL (ref 0.1–1.0)
Monocytes Relative: 5 %
Neutro Abs: 14.4 10*3/uL — ABNORMAL HIGH (ref 1.7–7.7)
Neutrophils Relative %: 86 %
Platelets: 160 10*3/uL (ref 150–400)
RBC: 4.62 MIL/uL (ref 4.22–5.81)
RDW: 20.4 % — ABNORMAL HIGH (ref 11.5–15.5)
WBC: 16.9 10*3/uL — ABNORMAL HIGH (ref 4.0–10.5)
nRBC: 0.2 % (ref 0.0–0.2)

## 2021-12-21 LAB — PROTIME-INR
INR: 1.4 — ABNORMAL HIGH (ref 0.8–1.2)
Prothrombin Time: 16.7 seconds — ABNORMAL HIGH (ref 11.4–15.2)

## 2021-12-21 LAB — COMPREHENSIVE METABOLIC PANEL
ALT: 41 U/L (ref 0–44)
AST: 49 U/L — ABNORMAL HIGH (ref 15–41)
Albumin: 2.3 g/dL — ABNORMAL LOW (ref 3.5–5.0)
Alkaline Phosphatase: 114 U/L (ref 38–126)
Anion gap: 11 (ref 5–15)
BUN: 88 mg/dL — ABNORMAL HIGH (ref 6–20)
CO2: 31 mmol/L (ref 22–32)
Calcium: 8.7 mg/dL — ABNORMAL LOW (ref 8.9–10.3)
Chloride: 115 mmol/L — ABNORMAL HIGH (ref 98–111)
Creatinine, Ser: 2.27 mg/dL — ABNORMAL HIGH (ref 0.61–1.24)
GFR, Estimated: 33 mL/min — ABNORMAL LOW (ref >60)
Glucose, Bld: 104 mg/dL — ABNORMAL HIGH (ref 70–99)
Potassium: 3.3 mmol/L — ABNORMAL LOW (ref 3.5–5.1)
Sodium: 157 mmol/L — ABNORMAL HIGH (ref 135–145)
Total Bilirubin: 1.3 mg/dL — ABNORMAL HIGH (ref 0.3–1.2)
Total Protein: 8.3 g/dL — ABNORMAL HIGH (ref 6.5–8.1)

## 2021-12-21 LAB — URINALYSIS, ROUTINE W REFLEX MICROSCOPIC
Bilirubin Urine: NEGATIVE
Glucose, UA: NEGATIVE mg/dL
Hgb urine dipstick: NEGATIVE
Ketones, ur: NEGATIVE mg/dL
Leukocytes,Ua: NEGATIVE
Nitrite: NEGATIVE
Protein, ur: NEGATIVE mg/dL
Specific Gravity, Urine: 1.034 — ABNORMAL HIGH (ref 1.005–1.030)
pH: 5 (ref 5.0–8.0)

## 2021-12-21 LAB — BASIC METABOLIC PANEL
Anion gap: 10 (ref 5–15)
BUN: 81 mg/dL — ABNORMAL HIGH (ref 6–20)
CO2: 30 mmol/L (ref 22–32)
Calcium: 8.2 mg/dL — ABNORMAL LOW (ref 8.9–10.3)
Chloride: 114 mmol/L — ABNORMAL HIGH (ref 98–111)
Creatinine, Ser: 2.28 mg/dL — ABNORMAL HIGH (ref 0.61–1.24)
GFR, Estimated: 33 mL/min — ABNORMAL LOW (ref >60)
Glucose, Bld: 137 mg/dL — ABNORMAL HIGH (ref 70–99)
Potassium: 3.7 mmol/L (ref 3.5–5.1)
Sodium: 154 mmol/L — ABNORMAL HIGH (ref 135–145)

## 2021-12-21 LAB — GLUCOSE, CAPILLARY
Glucose-Capillary: 79 mg/dL (ref 70–99)
Glucose-Capillary: 97 mg/dL (ref 70–99)

## 2021-12-21 LAB — APTT: aPTT: 25 seconds (ref 24–36)

## 2021-12-21 LAB — PHOSPHORUS: Phosphorus: 4.9 mg/dL — ABNORMAL HIGH (ref 2.5–4.6)

## 2021-12-21 LAB — LACTIC ACID, PLASMA
Lactic Acid, Venous: 2.2 mmol/L (ref 0.5–1.9)
Lactic Acid, Venous: 3 mmol/L (ref 0.5–1.9)
Lactic Acid, Venous: 1.4 mmol/L (ref 0.5–1.9)

## 2021-12-21 LAB — MAGNESIUM: Magnesium: 2.8 mg/dL — ABNORMAL HIGH (ref 1.7–2.4)

## 2021-12-21 LAB — LIPASE, BLOOD: Lipase: 32 U/L (ref 11–51)

## 2021-12-21 MED ORDER — SODIUM CHLORIDE 0.45 % IV SOLN: INTRAVENOUS | Status: AC

## 2021-12-21 MED ORDER — POTASSIUM CHLORIDE 10 MEQ/100ML IV SOLN
10.0000 meq | INTRAVENOUS | Status: AC
  Administered 2021-12-21 (×2): 10 meq via INTRAVENOUS
  Filled 2021-12-21 (×2): qty 100

## 2021-12-21 MED ORDER — LORAZEPAM 2 MG/ML IJ SOLN
0.5000 mg | Freq: Four times a day (QID) | INTRAMUSCULAR | Status: DC | PRN
  Administered 2021-12-24: 0.5 mg via INTRAVENOUS
  Filled 2021-12-21: qty 1

## 2021-12-21 MED ORDER — CHLORHEXIDINE GLUCONATE CLOTH 2 % EX PADS
6.0000 | MEDICATED_PAD | Freq: Every day | CUTANEOUS | Status: DC
  Administered 2021-12-22 – 2021-12-24 (×3): 6 via TOPICAL

## 2021-12-21 MED ORDER — ONDANSETRON HCL 4 MG/2ML IJ SOLN
4.0000 mg | Freq: Once | INTRAMUSCULAR | Status: DC
  Filled 2021-12-21: qty 2

## 2021-12-21 MED ORDER — LACTATED RINGERS IV BOLUS
1000.0000 mL | Freq: Once | INTRAVENOUS | Status: AC
Start: 2021-12-21 — End: 2021-12-21
  Administered 2021-12-21: 1000 mL via INTRAVENOUS

## 2021-12-21 MED ORDER — HYDROMORPHONE HCL 1 MG/ML IJ SOLN
0.5000 mg | INTRAMUSCULAR | Status: DC | PRN
  Administered 2021-12-21 – 2021-12-22 (×4): 0.5 mg via INTRAVENOUS
  Filled 2021-12-21: qty 0.5
  Filled 2021-12-21: qty 1
  Filled 2021-12-21 (×2): qty 0.5

## 2021-12-21 MED ORDER — PANTOPRAZOLE SODIUM 40 MG IV SOLR
40.0000 mg | INTRAVENOUS | Status: DC
  Administered 2021-12-21 – 2021-12-23 (×3): 40 mg via INTRAVENOUS
  Filled 2021-12-21 (×3): qty 10

## 2021-12-21 MED ORDER — LACTATED RINGERS IV BOLUS
2000.0000 mL | Freq: Once | INTRAVENOUS | Status: AC
Start: 2021-12-21 — End: 2021-12-21
  Administered 2021-12-21: 2000 mL via INTRAVENOUS

## 2021-12-21 MED ORDER — FENTANYL 50 MCG/HR TD PT72
1.0000 | MEDICATED_PATCH | TRANSDERMAL | Status: DC
  Administered 2021-12-21 – 2021-12-24 (×2): 1 via TRANSDERMAL
  Filled 2021-12-21 (×2): qty 1

## 2021-12-21 MED ORDER — ACETAMINOPHEN 325 MG PO TABS: 650.0000 mg | ORAL_TABLET | Freq: Four times a day (QID) | ORAL | Status: DC | PRN

## 2021-12-21 MED ORDER — ALTEPLASE 2 MG IJ SOLR
2.0000 mg | Freq: Once | INTRAMUSCULAR | Status: AC
  Administered 2021-12-21: 2 mg
  Filled 2021-12-21: qty 2

## 2021-12-21 MED ORDER — HYDROMORPHONE HCL 1 MG/ML IJ SOLN
0.5000 mg | Freq: Once | INTRAMUSCULAR | Status: AC
  Administered 2021-12-21: 0.5 mg via INTRAVENOUS
  Filled 2021-12-21: qty 1

## 2021-12-21 MED ORDER — LACTATED RINGERS IV BOLUS
1000.0000 mL | Freq: Once | INTRAVENOUS | Status: AC
  Administered 2021-12-21: 1000 mL via INTRAVENOUS

## 2021-12-21 MED ORDER — ACETAMINOPHEN 650 MG RE SUPP: 650.0000 mg | Freq: Four times a day (QID) | RECTAL | Status: DC | PRN

## 2021-12-21 MED ORDER — LACTATED RINGERS IV SOLN: INTRAVENOUS | Status: DC

## 2021-12-21 MED ORDER — SODIUM CHLORIDE 0.9% FLUSH
10.0000 mL | INTRAVENOUS | Status: DC | PRN
  Administered 2021-12-22: 10 mL

## 2021-12-21 MED ORDER — IOHEXOL 300 MG/ML  SOLN
100.0000 mL | Freq: Once | INTRAMUSCULAR | Status: AC | PRN
Start: 2021-12-21 — End: 2021-12-21
  Administered 2021-12-21: 100 mL via INTRAVENOUS

## 2021-12-21 MED ORDER — INSULIN ASPART 100 UNIT/ML IJ SOLN
0.0000 [IU] | Freq: Three times a day (TID) | INTRAMUSCULAR | Status: DC
  Filled 2021-12-21: qty 0.09

## 2021-12-21 MED ORDER — METRONIDAZOLE 500 MG/100ML IV SOLN
500.0000 mg | Freq: Two times a day (BID) | INTRAVENOUS | Status: DC
  Administered 2021-12-21 – 2021-12-22 (×2): 500 mg via INTRAVENOUS
  Filled 2021-12-21 (×2): qty 100

## 2021-12-21 MED ORDER — CEFEPIME HCL 2 G IJ SOLR: 2.0000 g | Freq: Three times a day (TID) | INTRAMUSCULAR | Status: DC

## 2021-12-21 MED ORDER — ONDANSETRON HCL 4 MG/2ML IJ SOLN
4.0000 mg | Freq: Once | INTRAMUSCULAR | Status: AC
  Administered 2021-12-21: 4 mg via INTRAVENOUS
  Filled 2021-12-21: qty 2

## 2021-12-21 MED ORDER — ONDANSETRON HCL 4 MG/2ML IJ SOLN
4.0000 mg | Freq: Four times a day (QID) | INTRAMUSCULAR | Status: DC | PRN
  Administered 2021-12-21 – 2021-12-22 (×3): 4 mg via INTRAVENOUS
  Filled 2021-12-21 (×2): qty 2

## 2021-12-21 MED ORDER — ENOXAPARIN SODIUM 100 MG/ML IJ SOSY
95.0000 mg | PREFILLED_SYRINGE | Freq: Two times a day (BID) | INTRAMUSCULAR | Status: DC
  Administered 2021-12-21 – 2021-12-24 (×6): 95 mg via SUBCUTANEOUS
  Filled 2021-12-21 (×6): qty 1

## 2021-12-21 MED ORDER — STERILE WATER FOR INJECTION IJ SOLN
INTRAMUSCULAR | Status: AC
  Filled 2021-12-21: qty 10

## 2021-12-21 MED ORDER — TRAVASOL 10 % IV SOLN
INTRAVENOUS | Status: DC
  Filled 2021-12-21: qty 528

## 2021-12-21 MED ORDER — NALOXONE HCL 0.4 MG/ML IJ SOLN: 0.4000 mg | INTRAMUSCULAR | Status: DC | PRN

## 2021-12-21 MED ORDER — ALTEPLASE 2 MG IJ SOLR
2.0000 mg | Freq: Once | INTRAMUSCULAR | Status: AC
  Administered 2021-12-21: 2 mg
  Filled 2021-12-21 (×2): qty 2

## 2021-12-21 MED ORDER — PIPERACILLIN-TAZOBACTAM 3.375 G IVPB 30 MIN
3.3750 g | Freq: Once | INTRAVENOUS | Status: AC
  Administered 2021-12-21: 3.375 g via INTRAVENOUS
  Filled 2021-12-21: qty 50

## 2021-12-21 MED ORDER — PIPERACILLIN-TAZOBACTAM 3.375 G IVPB 30 MIN: 3.3750 g | Freq: Three times a day (TID) | INTRAVENOUS | Status: DC

## 2021-12-21 MED ORDER — FENTANYL 100 MCG/HR TD PT72
2.0000 | MEDICATED_PATCH | TRANSDERMAL | Status: DC
  Administered 2021-12-21 – 2021-12-24 (×2): 2 via TRANSDERMAL
  Filled 2021-12-21 (×2): qty 2

## 2021-12-21 MED ORDER — SODIUM CHLORIDE 0.9 % IV SOLN
2.0000 g | Freq: Two times a day (BID) | INTRAVENOUS | Status: DC
  Administered 2021-12-21 – 2021-12-22 (×2): 2 g via INTRAVENOUS
  Filled 2021-12-21 (×2): qty 12.5

## 2021-12-21 NOTE — ED Triage Notes (Signed)
Patient also c/o lightheadedness, weakness, chills, nausea and vomiting x10 episodes in the past 24 hours. Denies blood in emesis. Pt noted to be hypotensive during triage and states his BP usually runs 130s/70s. Pt getting chemo for stage 4 colon CA - last chemo 3 weeks ago. ?

## 2021-12-21 NOTE — Progress Notes (Signed)
?  Carryover admission to the Day Admitter.  I discussed this case with the EDP, Dr.Bero.  Per these discussions: ? ? ?This is a 57 year old male with history of primary colon cancer with metastatic disease throughout the abdomen, who is being admitted for acute kidney injury, dehydration, inability to tolerate p.o. in the setting of presenting generalized abdominal discomfort as well as nausea/vomiting over the past 1 to 2 days.  Additionally, while at home, the patient had rolled over, dislodging his PEG tube.  ? ?Upon arrival at the ED he was noted to be hypotensive with tachycardia.  Found to have an AKI, sodium 157 compared to most recent prior value of 156 on 12/12/2021.  Initial lactate 2.2 will.  ? ?He has received a 2 L lactated Ringer bolus, with ensuing improvement in systolic blood pressure from initial systolic values in the 44H, now into the low 100s mmHg, with corresponding improvement in initial heart rates in the 120s to 130s, now into the low 100s.  Lactate ordered, with result currently pending. ? ?PEG tube was replaced, with ensuing CT imaging showing appropriate placement.  CT imaging also reportedly showed potential small bowel obstruction.  ? ?He has received multiple doses of IV Dilaudid, with abdominal discomfort improving, but continues to experience nausea/vomiting.  ? ?I have placed an order for inpatient admission to PCU for evaluation management of acute kidney injury, dehydration, hyponatremia, intractable nausea/vomiting, other evaluation of potential small bowel obstruction.  ? ?I have placed some additional preliminary admit orders via the adult multi-morbid admission order set.  I resumed his partial CODE STATUS as ordered at the time of his most recent prior hospitalizations.  ? ?I have also ordered continuous lactated Ringer's, with repeat BMP to be checked at 8 AM to further trend serum sodium level.  For his residual nausea/vomiting I will order prn Zofran as well as prn IV  Ativan for nausea/vomiting refractory to Zofran.  Also placed an order for add on serum magnesium level.  Additionally, have placed order for prn IV Dilaudid.  NPO. ? ? ? ?Babs Bertin, DO ?Hospitalist ? ?

## 2021-12-21 NOTE — ED Notes (Signed)
Unable to obtain blood from port site. Specimens obtained via peripheral butterfly stick. ?

## 2021-12-21 NOTE — H&P (Addendum)
?History and Physical  ? ? ?Patient: Ricardo [name] TKW:[id number] DOB: 10-17-1965 ?DOA: 12/21/2021 ?DOS: the patient was seen and examined on 12/21/2021 ?PCP: [name], MD  ?Patient coming from: Home ? ?Chief Complaint:  ?Chief Complaint  ?Patient presents with  ? PEG tube dislodged  ? GI Problem  ?  PEG tube dislodged  ? Hypotension  ? ?HPI: Ricardo [name] is a 56 y.o. male with medical history significant of osteoarthritis, history of class II obesity now overweight after progressive weight loss in the setting of chronic malnutrition, generalized anxiety disorder, hyperlipidemia, unspecified sleep disorder, GERD, stage IV colon cancer with metastasis and peritoneal carcinomatosis who is familiar to me after I admitted him into the from times in January of this year due to SBO requiring PEG placement for venting purposes who is coming to the emergency department after he accidentally pulled out his PEG tube.  His wife stated that his mentation has decreased.  He has been more somnolent and has required less pain medication.  They went to the beach recently and he had to be admitted while he was surgery due to intense abdominal pain associated with nausea and vomiting.  He was unable to elaborate as he is somnolent, he was disoriented to time and date, but knew he was in the hospital.  He was able to answer simple questions.  He denied headache, chest or back pain.  He stated the he is still having some abdominal pain at a 5 out of 10 level. ? ?ED course: Initial vital signs were temperature 97.7 ?F, pulse 130, respirations 22, BP 83/74 mmHg and O2 sat 98% on room air.  The patient received 2000 mL of LR bolus, ondansetron 4 mg IVP, hydromorphone 0.5 mg IVP x2 and Zosyn 3.375 g IVPB x1. ? ?Lab work: His urinalysis showed increase of specific gravity of 1.0 34, but was otherwise unremarkable.  CBC showed a white count of 16.9, hemoglobin 13.1 g/dL platelets 160.  PT was 16.7, INR 1.4 and PTT 25.  Lipase was  32.  Lactic acid was 2.2 and then 3.0 mmol/L.  CMP showed a sodium 157, potassium 3.3, chloride of 115 and CO2 31 mmol/L.  Glucose 104, BUN 88 and creatinine 2.27 mg/dL.  Total protein 8.3 and albumin 2.3 g/dL.  AST and bilirubin slightly elevated. ? ?Imaging: One-view portable chest radiograph with no acute abnormality.  CT abdomen/pelvis showed continue clumping of the small bowel towards the anterior abdomen with some segments of deficit to the abdominal wall.  There is least a low to intermediate grade obstruction involving the jejunum with no ? transition point with multiple angulated segments in the clump of small bowel.  There is possible omental metastases.  Distended but otherwise unremarkable gallbladder.  Small increase in left pleural effusion.  Please see images and full radiology report for further details. ?  ?Review of Systems: As mentioned in the history of present illness. All other systems reviewed and are negative. ?Past Medical History:  ?Diagnosis Date  ? Arthritis   ? Class 2 obesity 09/14/2021  ? Colon cancer ([place])   ? with metastasis  ? Family history of adverse reaction to anesthesia   ? mother had problem with it due to her asthma  ? Family history of breast cancer   ? Family history of prostate cancer   ? GAD (generalized anxiety disorder) 09/03/2021  ? GERD (gastroesophageal reflux disease) 09/03/2021  ? History of COVID-19 04/30/2021  ? Formatting of this note  might be different from the original. 01/2021, asymptomatic  ? Hyperlipidemia 11/27/2020  ? Pleural effusion, left 09/06/2021  ? Sleep disorder 11/10/2018  ? ?Past Surgical History:  ?Procedure Laterality Date  ? BOWEL RESECTION N/A 11/29/2020  ? Procedure: PARTIAL BOWEL RESECTION;  Surgeon: Jovita Kussmaul, MD;  Location: [place];  Service: General;  Laterality: N/A;  ? GASTROSTOMY Left 11/29/2020  ? Procedure: INSERTION OF GASTROSTOMY TUBE;  Surgeon: Jovita Kussmaul, MD;  Location: [place];  Service: General;  Laterality: Left;  ? IR FLUORO  GUIDE CV LINE LEFT  11/10/2021  ? IR GASTROSTOMY TUBE MOD SED  09/11/2021  ? IR IMAGING GUIDED PORT INSERTION  12/12/2020  ? IR REPLC GASTRO/COLONIC TUBE PERCUT W/FLUORO  12/12/2021  ? IR US GUIDE VASC ACCESS LEFT  11/10/2021  ? LAPAROSCOPIC APPENDECTOMY N/A 01/17/2019  ? Procedure: APPENDECTOMY LAPAROSCOPIC;  Surgeon: Ralene Ok, MD;  Location: [place];  Service: General;  Laterality: N/A;  ? LAPAROTOMY N/A 11/29/2020  ? Procedure: EXPLORATORY LAPAROTOMY;  Surgeon: Jovita Kussmaul, MD;  [facility];  Service: General;  Laterality: N/A;  PUT CASE IN ROOM 1 STARTING AT 9:30AM FOR 120 MIN  ? OSTOMY N/A 11/29/2020  ? Procedure: POSSIBLE OSTOMY CREATION;  Surgeon: Jovita Kussmaul, MD;  Location: [place];  Service: General;  Laterality: N/A;  ? RECTAL BIOPSY N/A 11/29/2020  ? Procedure: PERITIONEAL BIOPSY;  Surgeon: Jovita Kussmaul, MD;  Location: [place];  Service: General;  Laterality: N/A;  ? SHOULDER SURGERY Left 09/19/2015  ? ?Social History:  reports that he has never smoked. He has never used smokeless tobacco. He reports that he does not currently use alcohol. He reports that he does not use drugs. ? ?Allergies  ?Allergen Reactions  ? Oxaliplatin Hives and Other (See Comments)  ?  15 minutes into infusion, patient started to complain of feeling warm and states " it feels like the last time I had my reaction"  ? ? ?Family History  ?Problem Relation Age of Onset  ? Cancer Sister 28  ?     breast cancer  ? Cancer Brother 41  ?     prostate cancer   ? High Cholesterol Brother   ? Pancreatic cancer Maternal Uncle   ? Bone cancer Cousin   ?     pat first cousin  ? Colon cancer Neg Hx   ? Liver disease Neg Hx   ? Esophageal cancer Neg Hx   ? Stomach cancer Neg Hx   ? ? ?Prior to Admission medications   ?Medication Sig Start Date End Date Taking? Authorizing Provider  ?DULoxetine (CYMBALTA) 30 MG capsule Take 1 capsule (30 mg total) by mouth daily. 12/05/21   Pickenpack-Cousar, [name], NP  ?fentaNYL (DURAGESIC) 100 MCG/HR Place  2 patches onto the skin every 3 (three) days. Along with 50 MCG/HR patch for a total of 250 MCG/HR dose. 11/26/21 12/26/21  Pickenpack-Cousar, [name], NP  ?fentaNYL (DURAGESIC) 50 MCG/HR Place 1 patch onto the skin every 3 (three) days. Along with 2 of 100 MCG/HR patch for a total of 250 MCG/HR dose. 11/26/21   Pickenpack-Cousar, [name], NP  ?HYDROmorphone HCl (DILAUDID) 1 MG/ML LIQD Take 4-6 mLs (4-6 mg total) by mouth every 3 (three) hours as needed for severe pain or moderate pain. 12/09/21   Pickenpack-Cousar, [name], NP  ?LORazepam (ATIVAN) 0.5 MG tablet Take 1 tablet (0.5 mg total) by mouth every 6 (six) hours as needed for anxiety. 11/13/21 11/13/22  Pickenpack-Cousar,  [name], NP  ?naloxone (NARCAN) nasal spray 4 mg/0.1 mL Use 1 spray for opioid overdose ?Patient taking differently: 0.4 mg once as needed (opioid overdose). 09/12/21   Debbe Odea, MD  ?Nutritional Supplements (FEEDING SUPPLEMENT, BOOST BREEZE,) LIQD Take 1 each by mouth 3 (three) times daily before meals. 10/14/21   Lavina Hamman, MD  ?pantoprazole (PROTONIX) 40 MG tablet Take 40 mg by mouth 2 (two) times daily. 12/04/21   [provider]  ?pantoprazole sodium (PROTONIX) 40 mg Place 40 mg into feeding tube daily. 10/16/21   [name], MD  ?polyethylene glycol (MIRALAX / GLYCOLAX) 17 g packet Take 17 g by mouth daily as needed for mild constipation.    [provider]  ?prochlorperazine (COMPAZINE) 10 MG tablet Take 1 tablet (10 mg total) by mouth every 6 (six) hours as needed for nausea or vomiting. 09/26/21   [name], MD  ?promethazine (PHENERGAN) 25 MG suppository Place 1 suppository (25 mg total) rectally every 6 (six) hours as needed for nausea or vomiting. 11/24/21   Molpus, John, MD  ?rivaroxaban (XARELTO) 20 MG TABS tablet Take 1 tablet (20 mg total) by mouth daily with supper. 12/15/21   [name], MD  ? ? ?Physical Exam: ?Vitals:  ? 12/21/21 0445 12/21/21 0515 12/21/21 0530 12/21/21 0645  ?BP: 111/86 101/82 102/88    ?Pulse: (!) 113 (!) 106 (!) 105 (!) 103  ?Resp: '19 13 13 15  '$ ?Temp:      ?TempSrc:      ?SpO2: 100% 98% 98% 95%  ?Weight:      ?Height:      ? ?Physical Exam ?Vitals and nursing note reviewed.  ?Consti

## 2021-12-21 NOTE — Progress Notes (Addendum)
PHARMACY - TOTAL PARENTERAL NUTRITION CONSULT NOTE  ? ?Indication: Small bowel obstruction ? ?Patient Measurements: ?Height: '5\' 11"'$  (180.3 cm) ?Weight: 93.9 kg (207 lb) ?IBW/kg (Calculated) : 75.3 ?TPN AdjBW (KG): 93.9 ?Body mass index is 28.87 kg/m?. ?Usual Weight:  ? ?Assessment:  ?48 yoM presents to ED on 4/9 with N/V, chills, weakness, hypotension.  He is on chronic TPN, and has venting PEG tube but accidentally dislodged his PEG tube at home.  PMH is significant for metastatic colon cancer, currently undergoing chemotherapy with last treatment 3 weeks ago. ? ?Glucose / Insulin: No hx DM. Hgb A1c 5.5% on 09/14/21 ?- Glucose 137 with AM labs ?Electrolytes: Elevated Na, Cl, Mag, Phos.  K WNL, CorrCa 9.5 ?- KCl 10 mEq IV x2 runs per MD on 4/9 AM. ?Renal: AKI related to dehydration; SCr 2.28 (baseline SCr ~ 0.7), BUN 81 ?Hepatic: AST mildly elevated at 49 and Tbili 1.3, ALT wnl ?Intake / Output; MIVF:  NaCl 0.45% at 150 ml/hr ?GI Imaging: ?4/9 CT abd: clumping of the small bowel towards the anterior abdomen with some segments adhesive to the abdominal wall.  There is least a low to intermediate grade obstruction involving the jejunum. ?GI Surgeries / Procedures:  ? ?Central access: Implanted port, single lumen CVC ?TPN start date: 12/21/21 ? ?Nutritional Goals: ?Goal TPN rate is 100 mL/hr (provides 132 g of protein and 2472 kcals per day) ? ?RD Assessment: Pending ?  ?(Previous RD assessment 10/03/21: Total Energy Estimated Needs: 2365-2600 kcal, Total Protein Estimated Needs: 120-135 grams, Total Fluid Estimated Needs: >/= 2.5 L/day) ? ?Current Nutrition:  ?NPO ? ?Plan:  ?Start TPN at 40 mL/hr at 1800 ?Electrolytes in TPN: remove all electrolytes d/t AKI, replace outside of TPN as needed.  ?Add standard MVI and trace elements to TPN ?Initiate Sensitive q8h SSI and adjust as needed  ?Reduce MIVF to 110 mL/hr at 1800 ?Monitor TPN labs on Mon/Thurs ? ?Gretta Arab PharmD, BCPS ?Clinical Pharmacist ?[facility]  [phone] ?12/21/2021 9:56 AM ? ? ?

## 2021-12-21 NOTE — Progress Notes (Signed)
Pt being followed by [facility] for Sepsis protocol. 

## 2021-12-21 NOTE — ED Notes (Signed)
Pt states that he does not need zofran at this time ?

## 2021-12-21 NOTE — ED Notes (Signed)
Attempted to obtain port access at this time but Dr Sedonia Small at bedside to replace G-tube. ?

## 2021-12-21 NOTE — ED Provider Notes (Signed)
?[place] DEPT ?[facility] Emergency Department ?Provider Note ?MRN:  [id number]  ?Arrival date & time: 12/21/21    ? ?Chief Complaint   ?PEG tube dislodged, GI Problem (PEG tube dislodged), and Hypotension ?  ?History of Present Illness   ?Ricardo [name] is a 56 y.o. year-old male with a history of colon cancer presenting to the ED with chief complaint of PEG tube dislodgment. ? ?Accidentally pulled out PEG tube a few hours ago.  Has been having abdominal pain, malaise, nausea all day today.  Denies fever.  No chest pain or shortness of breath. ? ?Review of Systems  ?A thorough review of systems was obtained and all systems are negative except as noted in the HPI and PMH.  ? ?Patient's Health History   ? ?Past Medical History:  ?Diagnosis Date  ? Arthritis   ? Class 2 obesity 09/14/2021  ? Colon cancer ([place])   ? with metastasis  ? Family history of adverse reaction to anesthesia   ? mother had problem with it due to her asthma  ? Family history of breast cancer   ? Family history of prostate cancer   ? GAD (generalized anxiety disorder) 09/03/2021  ? GERD (gastroesophageal reflux disease) 09/03/2021  ? History of COVID-19 04/30/2021  ? Formatting of this note might be different from the original. 01/2021, asymptomatic  ? Hyperlipidemia 11/27/2020  ? Pleural effusion, left 09/06/2021  ? Sleep disorder 11/10/2018  ?  ?Past Surgical History:  ?Procedure Laterality Date  ? BOWEL RESECTION N/A 11/29/2020  ? Procedure: PARTIAL BOWEL RESECTION;  Surgeon: Jovita Kussmaul, MD;  Location: [place];  Service: General;  Laterality: N/A;  ? GASTROSTOMY Left 11/29/2020  ? Procedure: INSERTION OF GASTROSTOMY TUBE;  Surgeon: Jovita Kussmaul, MD;  Location: [place];  Service: General;  Laterality: Left;  ? IR FLUORO GUIDE CV LINE LEFT  11/10/2021  ? IR GASTROSTOMY TUBE MOD SED  09/11/2021  ? IR IMAGING GUIDED PORT INSERTION  12/12/2020  ? IR REPLC GASTRO/COLONIC TUBE PERCUT W/FLUORO  12/12/2021  ? IR US GUIDE VASC ACCESS LEFT  11/10/2021   ? LAPAROSCOPIC APPENDECTOMY N/A 01/17/2019  ? Procedure: APPENDECTOMY LAPAROSCOPIC;  Surgeon: Ralene Ok, MD;  Location: [place];  Service: General;  Laterality: N/A;  ? LAPAROTOMY N/A 11/29/2020  ? Procedure: EXPLORATORY LAPAROTOMY;  Surgeon: Jovita Kussmaul, MD;  [facility];  Service: General;  Laterality: N/A;  PUT CASE IN ROOM 1 STARTING AT 9:30AM FOR 120 MIN  ? OSTOMY N/A 11/29/2020  ? Procedure: POSSIBLE OSTOMY CREATION;  Surgeon: Jovita Kussmaul, MD;  Location: [place];  Service: General;  Laterality: N/A;  ? RECTAL BIOPSY N/A 11/29/2020  ? Procedure: PERITIONEAL BIOPSY;  Surgeon: Jovita Kussmaul, MD;  Location: [place];  Service: General;  Laterality: N/A;  ? SHOULDER SURGERY Left 09/19/2015  ?  ?Family History  ?Problem Relation Age of Onset  ? Cancer Sister 50  ?     breast cancer  ? Cancer Brother 25  ?     prostate cancer   ? High Cholesterol Brother   ? Pancreatic cancer Maternal Uncle   ? Bone cancer Cousin   ?     pat first cousin  ? Colon cancer Neg Hx   ? Liver disease Neg Hx   ? Esophageal cancer Neg Hx   ? Stomach cancer Neg Hx   ?  ?Social History  ? ?Socioeconomic History  ? Marital status: Married  ?  Spouse name: Not on  file  ? Number of children: Not on file  ? Years of education: Not on file  ? Highest education level: Not on file  ?Occupational History  ? Not on file  ?Tobacco Use  ? Smoking status: Never  ? Smokeless tobacco: Never  ?Vaping Use  ? Vaping Use: Never used  ?Substance and Sexual Activity  ? Alcohol use: Not Currently  ?  Comment: rare  ? Drug use: Never  ? Sexual activity: Not Currently  ?Other Topics Concern  ? Not on file  ?Social History Narrative  ? Not on file  ? ?Social Determinants of Health  ? ?Financial Resource Strain: Medium Risk  ? Difficulty of Paying Living Expenses: Somewhat hard  ?Food Insecurity: Not on file  ?Transportation Needs: Not on file  ?Physical Activity: Not on file  ?Stress: Not on file  ?Social Connections: Not on file  ?Intimate Partner Violence:  Not on file  ?  ? ?Physical Exam  ? ?Vitals:  ? 12/21/21 0445 12/21/21 0530  ?BP: 111/86 102/88  ?Pulse: (!) 113 (!) 105  ?Resp: 19 13  ?Temp:    ?SpO2: 100% 98%  ?  ?CONSTITUTIONAL: Chronically ill-appearing, NAD ?NEURO/PSYCH:  Alert and oriented x 3, no focal deficits ?EYES:  eyes equal and reactive ?ENT/NECK:  no LAD, no JVD ?CARDIO: Regular rate, well-perfused, normal S1 and S2 ?PULM:  CTAB no wheezing or rhonchi ?GI/GU: Mild distention, firmness to the upper abdomen, epigastric stoma present with no surrounding erythema ?MSK/SPINE:  No gross deformities, no edema ?SKIN:  no rash, atraumatic ? ? ?*Additional and/or pertinent findings included in MDM below ? ?Diagnostic and Interventional Summary  ? ? EKG Interpretation ? ?Date/Time:  Sunday December 21 2021 02:02:06 EDT ?Ventricular Rate:  129 ?PR Interval:  141 ?QRS Duration: 85 ?QT Interval:  340 ?QTC Calculation: 499 ?R Axis:   87 ?Text Interpretation: Sinus tachycardia Low voltage, precordial leads Nonspecific T abnormalities, inferior leads Borderline prolonged QT interval Confirmed by Gerlene Fee [phone]) on 12/21/2021 3:15:37 AM ?  ? ?  ? ?Labs Reviewed  ?LACTIC ACID, PLASMA - Abnormal; Notable for the following components:  ?    Result Value  ? Lactic Acid, Venous 2.2 (*)   ? All other components within normal limits  ?LACTIC ACID, PLASMA - Abnormal; Notable for the following components:  ? Lactic Acid, Venous 3.0 (*)   ? All other components within normal limits  ?COMPREHENSIVE METABOLIC PANEL - Abnormal; Notable for the following components:  ? Sodium 157 (*)   ? Potassium 3.3 (*)   ? Chloride 115 (*)   ? Glucose, Bld 104 (*)   ? BUN 88 (*)   ? Creatinine, Ser 2.27 (*)   ? Calcium 8.7 (*)   ? Total Protein 8.3 (*)   ? Albumin 2.3 (*)   ? AST 49 (*)   ? Total Bilirubin 1.3 (*)   ? GFR, Estimated 33 (*)   ? All other components within normal limits  ?CBC WITH DIFFERENTIAL/PLATELET - Abnormal; Notable for the following components:  ? WBC 16.9 (*)   ? RDW 20.4  (*)   ? Neutro Abs 14.4 (*)   ? Abs Immature Granulocytes 0.18 (*)   ? All other components within normal limits  ?PROTIME-INR - Abnormal; Notable for the following components:  ? Prothrombin Time 16.7 (*)   ? INR 1.4 (*)   ? All other components within normal limits  ?URINALYSIS, ROUTINE W REFLEX MICROSCOPIC - Abnormal; Notable for the following components:  ?  Specific Gravity, Urine 1.034 (*)   ? All other components within normal limits  ?MAGNESIUM - Abnormal; Notable for the following components:  ? Magnesium 2.8 (*)   ? All other components within normal limits  ?CULTURE, BLOOD (ROUTINE X 2)  ?CULTURE, BLOOD (ROUTINE X 2)  ?URINE CULTURE  ?APTT  ?LIPASE, BLOOD  ?BASIC METABOLIC PANEL  ?  ?CT ABDOMEN PELVIS W CONTRAST  ?Final Result  ?  ?DG Chest Port 1 View  ?Final Result  ?  ?  ?Medications  ?ondansetron (ZOFRAN) injection 4 mg (has no administration in time range)  ?lactated ringers infusion (has no administration in time range)  ?acetaminophen (TYLENOL) tablet 650 mg (has no administration in time range)  ?  Or  ?acetaminophen (TYLENOL) suppository 650 mg (has no administration in time range)  ?ondansetron (ZOFRAN) injection 4 mg (has no administration in time range)  ?LORazepam (ATIVAN) injection 0.5 mg (has no administration in time range)  ?naloxone [facility]) injection 0.4 mg (has no administration in time range)  ?HYDROmorphone (DILAUDID) injection 0.5 mg (has no administration in time range)  ?lactated ringers bolus 2,000 mL (2,000 mLs Intravenous New Bag/Given 12/21/21 0318)  ?HYDROmorphone (DILAUDID) injection 0.5 mg (0.5 mg Intravenous Given 12/21/21 0320)  ?ondansetron [facility]) injection 4 mg (4 mg Intravenous Given 12/21/21 0320)  ?piperacillin-tazobactam (ZOSYN) IVPB 3.375 g (3.375 g Intravenous New Bag/Given 12/21/21 0318)  ?HYDROmorphone (DILAUDID) injection 0.5 mg (0.5 mg Intravenous Given 12/21/21 0356)  ?iohexol (OMNIPAQUE) 300 MG/ML solution 100 mL (100 mLs Intravenous Contrast Given 12/21/21 0425)  ?   ? ?Procedures  /  Critical Care ?.Critical Care ?Performed by: Maudie Flakes, MD ?Authorized by: Maudie Flakes, MD  ? ?Critical care provider statement:  ?  Critical care time (minutes):  40 ?  Cr

## 2021-12-21 NOTE — Progress Notes (Signed)
ANTICOAGULATION CONSULT NOTE - Initial Consult ? ?Pharmacy Consult for Enoxaparin ?Indication: DVT ? ?Allergies  ?Allergen Reactions  ? Oxaliplatin Hives and Other (See Comments)  ?  15 minutes into infusion, patient started to complain of feeling warm and states " it feels like the last time I had my reaction"  ? ? ?Patient Measurements: ?Height: '5\' 11"'$  (180.3 cm) ?Weight: 93.9 kg (207 lb) ?IBW/kg (Calculated) : 75.3 ?Heparin Dosing Weight:  ? ?Vital Signs: ?Temp: 97.4 ?F (36.3 ?C) (04/09 1407) ?Temp Source: Axillary (04/09 1407) ?BP: 96/81 (04/09 1407) ?Pulse Rate: 105 (04/09 1519) ? ?Labs: ?Recent Labs  ?  12/21/21 ?0309 12/21/21 ?4388  ?HGB 13.1  --   ?HCT 43.5  --   ?PLT 160  --   ?APTT 25  --   ?LABPROT 16.7*  --   ?INR 1.4*  --   ?CREATININE 2.27* 2.28*  ? ? ?Estimated Creatinine Clearance: 42.3 mL/min (A) (by C-G formula based on SCr of 2.28 mg/dL (H)). ? ? ?Medical History: ?Past Medical History:  ?Diagnosis Date  ? Arthritis   ? Class 2 obesity 09/14/2021  ? Colon cancer ([place])   ? with metastasis  ? Family history of adverse reaction to anesthesia   ? mother had problem with it due to her asthma  ? Family history of breast cancer   ? Family history of prostate cancer   ? GAD (generalized anxiety disorder) 09/03/2021  ? GERD (gastroesophageal reflux disease) 09/03/2021  ? History of COVID-19 04/30/2021  ? Formatting of this note might be different from the original. 01/2021, asymptomatic  ? Hyperlipidemia 11/27/2020  ? Pleural effusion, left 09/06/2021  ? Sleep disorder 11/10/2018  ? ? ?Medications:  ?Scheduled:  ? alteplase  2 mg Intracatheter Once  ? alteplase  2 mg Intracatheter Once  ? Chlorhexidine Gluconate Cloth  6 each Topical Daily  ? fentaNYL  2 patch Transdermal Q72H  ? fentaNYL  1 patch Transdermal Q72H  ? insulin aspart  0-9 Units Subcutaneous Q8H  ? ondansetron (ZOFRAN) IV  4 mg Intravenous Once  ? pantoprazole (PROTONIX) IV  40 mg Intravenous Q24H  ? ?Infusions:  ? sodium chloride 150 mL/hr at  12/21/21 1511  ? sodium chloride    ? ceFEPime (MAXIPIME) IV    ? metronidazole    ? TPN ADULT (ION)    ? ? ?Assessment: ?1 yoM presents to ED on 4/9 found to have SBO.  Prior to admission Xarelto for LUE DVT (10/2021) is held while NPO.  Pharmacy is consulted to dose Lovenox. ? ?SCr elevated at 2.28, CrCl ~ 42 ml/min ?CBC: Hgb and Plt WNL ? ?Goal of Therapy:  ?Anti-Xa level 0.6-1 units/ml 4hrs after LMWH dose given ?Monitor platelets by anticoagulation protocol: Yes ?  ?Plan:  ?Enoxaparin 95 mg (1 mg/kg) SQ q12h ?Follow up CBC, renal function, ability to tolerate PO/PT meds. ? ? ?Gretta Arab PharmD, BCPS ?Clinical Pharmacist ?[facility] [phone] ?12/21/2021 4:57 PM ?

## 2021-12-21 NOTE — ED Notes (Signed)
Dr Sedonia Small notified of pt's abnormal vital signs ?

## 2021-12-21 NOTE — ED Notes (Signed)
Attempted to recollect lactic from dual lumen CVC in L chest, but no blood return noted to either lumen. Repeat specimen collected via peripheral stick again. ?

## 2021-12-21 NOTE — ED Triage Notes (Signed)
Pt BIB [facility] from home. States that he rolled over and accidentally pulled out his PEG tube. Hx stage 4 metastatic colon cancer.  ?

## 2021-12-22 ENCOUNTER — Inpatient Hospital Stay (HOSPITAL_COMMUNITY): Payer: BC Managed Care – PPO

## 2021-12-22 ENCOUNTER — Inpatient Hospital Stay: Payer: BC Managed Care – PPO

## 2021-12-22 ENCOUNTER — Inpatient Hospital Stay: Payer: BC Managed Care – PPO | Admitting: Hematology

## 2021-12-22 ENCOUNTER — Other Ambulatory Visit (HOSPITAL_COMMUNITY): Payer: Self-pay

## 2021-12-22 ENCOUNTER — Inpatient Hospital Stay: Payer: BC Managed Care – PPO | Admitting: Nurse Practitioner

## 2021-12-22 DIAGNOSIS — D72829 Elevated white blood cell count, unspecified: Secondary | ICD-10-CM

## 2021-12-22 DIAGNOSIS — C182 Malignant neoplasm of ascending colon: Secondary | ICD-10-CM

## 2021-12-22 DIAGNOSIS — D649 Anemia, unspecified: Secondary | ICD-10-CM

## 2021-12-22 DIAGNOSIS — M7989 Other specified soft tissue disorders: Secondary | ICD-10-CM

## 2021-12-22 DIAGNOSIS — I82402 Acute embolism and thrombosis of unspecified deep veins of left lower extremity: Secondary | ICD-10-CM

## 2021-12-22 DIAGNOSIS — N179 Acute kidney failure, unspecified: Secondary | ICD-10-CM | POA: Diagnosis not present

## 2021-12-22 DIAGNOSIS — C772 Secondary and unspecified malignant neoplasm of intra-abdominal lymph nodes: Secondary | ICD-10-CM

## 2021-12-22 DIAGNOSIS — K56609 Unspecified intestinal obstruction, unspecified as to partial versus complete obstruction: Secondary | ICD-10-CM

## 2021-12-22 DIAGNOSIS — C786 Secondary malignant neoplasm of retroperitoneum and peritoneum: Secondary | ICD-10-CM

## 2021-12-22 LAB — COMPREHENSIVE METABOLIC PANEL
ALT: 32 U/L (ref 0–44)
AST: 38 U/L (ref 15–41)
Albumin: 1.9 g/dL — ABNORMAL LOW (ref 3.5–5.0)
Alkaline Phosphatase: 93 U/L (ref 38–126)
Anion gap: 6 (ref 5–15)
BUN: 61 mg/dL — ABNORMAL HIGH (ref 6–20)
CO2: 28 mmol/L (ref 22–32)
Calcium: 7.8 mg/dL — ABNORMAL LOW (ref 8.9–10.3)
Chloride: 114 mmol/L — ABNORMAL HIGH (ref 98–111)
Creatinine, Ser: 1.5 mg/dL — ABNORMAL HIGH (ref 0.61–1.24)
GFR, Estimated: 54 mL/min — ABNORMAL LOW (ref >60)
Glucose, Bld: 115 mg/dL — ABNORMAL HIGH (ref 70–99)
Potassium: 3.1 mmol/L — ABNORMAL LOW (ref 3.5–5.1)
Sodium: 148 mmol/L — ABNORMAL HIGH (ref 135–145)
Total Bilirubin: 1 mg/dL (ref 0.3–1.2)
Total Protein: 6.6 g/dL (ref 6.5–8.1)

## 2021-12-22 LAB — TRIGLYCERIDES: Triglycerides: 173 mg/dL — ABNORMAL HIGH (ref <150)

## 2021-12-22 LAB — URINE CULTURE: Culture: <10000 — AB

## 2021-12-22 LAB — PHOSPHORUS: Phosphorus: 2.8 mg/dL (ref 2.5–4.6)

## 2021-12-22 LAB — MAGNESIUM: Magnesium: 2.2 mg/dL (ref 1.7–2.4)

## 2021-12-22 LAB — GLUCOSE, CAPILLARY
Glucose-Capillary: 115 mg/dL — ABNORMAL HIGH (ref 70–99)
Glucose-Capillary: 122 mg/dL — ABNORMAL HIGH (ref 70–99)

## 2021-12-22 MED ORDER — ONDANSETRON HCL 4 MG/2ML IJ SOLN
4.0000 mg | Freq: Four times a day (QID) | INTRAMUSCULAR | Status: DC | PRN
  Administered 2021-12-23 – 2021-12-24 (×2): 4 mg via INTRAVENOUS
  Filled 2021-12-22 (×2): qty 2

## 2021-12-22 MED ORDER — SODIUM CHLORIDE 0.9% FLUSH: INTRAVENOUS | Status: AC

## 2021-12-22 MED ORDER — HYDROMORPHONE 1 MG/ML IV SOLN: INTRAVENOUS | 0 refills | Status: AC

## 2021-12-22 MED ORDER — HYDROMORPHONE 1 MG/ML IV SOLN: INTRAVENOUS | Status: DC

## 2021-12-22 MED ORDER — HYDROMORPHONE 1 MG/ML IV SOLN
INTRAVENOUS | Status: DC
  Administered 2021-12-22: 1 mg via INTRAVENOUS
  Filled 2021-12-22: qty 30

## 2021-12-22 MED ORDER — DIPHENHYDRAMINE HCL 50 MG/ML IJ SOLN: 12.5000 mg | Freq: Four times a day (QID) | INTRAMUSCULAR | Status: DC | PRN

## 2021-12-22 MED ORDER — POTASSIUM CHLORIDE 10 MEQ/50ML IV SOLN
10.0000 meq | INTRAVENOUS | Status: DC
  Administered 2021-12-22: 10 meq via INTRAVENOUS
  Filled 2021-12-22 (×4): qty 50

## 2021-12-22 MED ORDER — SODIUM CHLORIDE 0.9 % IV SOLN
2.0000 g | Freq: Three times a day (TID) | INTRAVENOUS | Status: DC
  Filled 2021-12-22: qty 12.5

## 2021-12-22 MED ORDER — DIPHENHYDRAMINE HCL 12.5 MG/5ML PO ELIX: 12.5000 mg | ORAL_SOLUTION | Freq: Four times a day (QID) | ORAL | Status: DC | PRN

## 2021-12-22 MED ORDER — ENOXAPARIN SODIUM 100 MG/ML IJ SOSY
95.0000 mg | PREFILLED_SYRINGE | Freq: Two times a day (BID) | INTRAMUSCULAR | 0 refills | Status: AC
  Filled 2021-12-22: qty 30, 15d supply, fill #0
  Filled 2021-12-23: qty 30, 16d supply, fill #0

## 2021-12-22 MED ORDER — TRAVASOL 10 % IV SOLN
INTRAVENOUS | Status: DC
  Filled 2021-12-22: qty 1080

## 2021-12-22 MED ORDER — NALOXONE HCL 0.4 MG/ML IJ SOLN: 0.4000 mg | INTRAMUSCULAR | Status: DC | PRN

## 2021-12-22 MED ORDER — SODIUM CHLORIDE 0.9% FLUSH: 9.0000 mL | INTRAVENOUS | Status: DC | PRN

## 2021-12-22 MED ORDER — SODIUM CHLORIDE 0.45 % IV SOLN
INTRAVENOUS | Status: DC
Start: 2021-12-22 — End: 2021-12-22

## 2021-12-22 MED FILL — Fosaprepitant Dimeglumine For IV Infusion 150 MG (Base Eq): INTRAVENOUS | Qty: 5 | Status: AC

## 2021-12-22 NOTE — Progress Notes (Signed)
Bilateral lower extremity venous duplex has been completed. ?Preliminary results can be found in CV Proc through chart review.  ?Results were given to Dr. Starla Link. ? ?12/22/21 8:54 AM ?Carlos Levering RVT   ?

## 2021-12-22 NOTE — TOC Progression Note (Addendum)
Transition of Care (TOC) - CM/SW Discharge Note ? ? ?Patient Details  ?Name: Ricardo [name] ?MRN: [id number] ?Date of Birth: [birth year] ? ?Transition of Care (TOC) CM/SW Contact:  ?Tawanna Cooler, RN ?Phone Number: ?12/22/2021, 11:21 AM ? ? ?Clinical Narrative:    ? ?Received message that patient/family requesting hospice services.  Left message with wife, but patient in the room states he does not have a preference for a home hospice provider. ? ?Maryann with [facility] aware of this referral. She states they are checking with [employer] about a CAD pump, but patient will either be able to go home today or tomorrow, depending on the availability of the pump. ?TOC following.  ? ?1300 Addendum: Received a call back from patient's wife, who is agreeable to [facility].  Discussed that the patient will either go home today or tomorrow, and it will depend on when [facility] can get the needed equipment.  [name] verbalized understanding.  ? ? ? ?  ?Barriers to Discharge: Continued Medical Work up ? ?Readmission Risk Interventions ? ?  12/22/2021  ?  8:56 AM 09/16/2021  ?  3:08 PM 09/06/2021  ?  1:18 PM  ?Readmission Risk Prevention Plan  ?Transportation Screening Complete    ?[place] or Home Care Consult   Complete  ?Social Work Consult for [place] Planning/Counseling   Complete  ?Palliative Care Screening   Not Applicable  ?Medication Review [employer] photographer) Complete    ?[place] or Home Care Consult Complete Complete   ?SW Recovery Care/Counseling Consult Complete Complete   ?Palliative Care Screening Complete Complete   ?[place] Not Applicable Not Applicable   ? ? ? ? ? ?

## 2021-12-22 NOTE — Progress Notes (Signed)
Patient's BP 88/67, HR 100s. Pt repeatedly asking for PRN pain meds, c/o severe pain to left leg (left calf very swollen, warm, tender to touch- doppler ordered) and generalized pain. MD Starla Link aware, only PRN Dilaudid ordered. MD Starla Link to see patient soon. VM left for Palliative Consult team to request services sooner. Will continue to monitor closely.  ?

## 2021-12-22 NOTE — Progress Notes (Signed)
Nutrition Brief Note ? ?Patient screened for MST of 5 and consult for TPN. Chart reviewed. Patient seen by Palliative Care MD this AM and has now transitioned to comfort care. TPN and TPN consulted discontinued in line with this. Palliative Care MD's note indicates patient to be permitted ice chips, liquids, and very soft foods.  ? ?Patient has a venting PEG from PTA. ? ?No further nutrition interventions planned at this time. Please re-consult as needed.  ? ? ? ?Jarome Matin, MS, RD, LDN ?Registered Dietitian II ?Inpatient Clinical Nutrition ?RD pager # and on-call/weekend pager # available in [place]  ? ? ?

## 2021-12-22 NOTE — Discharge Summary (Incomplete)
Physician Discharge Summary  ?Ricardo [name] BJS:[id number] DOB: [birth year] DOA: 12/21/2021 ? ?PCP: [name], MD ? ?Admit date: 12/21/2021 ?Discharge date: 12/22/2021 ? ?Admitted From: Home ?Disposition: Home with hospice ? ?Recommendations for Outpatient Follow-up:  ?Follow up with home hospice at earliest convenience  ? ?Home Health: No ?Equipment/Devices: None ? ?Discharge Condition: Poor ?CODE STATUS: DNR ?Diet recommendation: Ice chips and liquids and very soft foods, as tolerated ? 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  On presentation, he was tachycardic, hypotensive, tachypneic and was found to have leukocytosis with lactic acid of 2.2 then subsequently 3.  Creatinine 2.27.  Chest x-ray was negative for acute abnormality. CT abdomen/pelvis showed continued small bowel obstruction with omental metastases.  He was started on IV fluids and antibiotics empirically.  Oncology and palliative care were consulted.  He was subsequently found to have acute lower extremity DVT for which she was started on Lovenox therapeutic dosing twice a day.  Patient/family subsequently agreeable for home hospice.  Patient has been started on Dilaudid PCA pump which will be continued on discharge.  He will be discharged home with hospice once arrangements have been made. ? ?Discharge Diagnoses:  ?Acute kidney injury ?Persistent small bowel obstruction ?Omental metastases ?Stage IV colon cancer with metastases and peritoneal carcinomatosis ?Dislodged PEG tube ?Acute left lower extremity DVT ?Leukocytosis ?Hyponatremia ?Hypokalemia ?Hypoalbuminemia ?Possible severe malnutrition ?GERD ?Hyperlipidemia ?Generalized anxiety  disorder ? ?Plan ?-As discussed above, after discussion with palliative care team, patient has been started on Dilaudid PCA pump.  This will be continued on discharge.  He will be discharged to home with hospice once arrangements have been made.  Lovenox has been started which also will be continued for palliation as per palliative care team.  He will be allowed to have ice chips and liquids and very soft foods, as tolerated based on the ability to vent his PEG tube. ? ?Discharge Instructions ? ? ?Allergies as of 12/22/2021   ? ?   Reactions  ? Oxaliplatin Hives, Other (See Comments)  ? 15 minutes into infusion, patient started to complain of feeling warm and states " it feels like the last time I had my reaction"  ? ?  ? ?  ?Medication List  ?  ? ?STOP taking these medications   ? ?DULoxetine 30 MG capsule ?Commonly known as: CYMBALTA ?  ?feeding supplement (BOOST BREEZE) Liqd ?  ?HYDROmorphone HCl 1 MG/ML Liqd ?Commonly known as: DILAUDID ?  ?naloxone 4 MG/0.1ML Liqd nasal spray kit ?Commonly known as: NARCAN ?  ?polyethylene glycol 17 g packet ?Commonly known as: MIRALAX / GLYCOLAX ?  ?prochlorperazine 10 MG tablet ?Commonly known as: COMPAZINE ?  ?rivaroxaban 20 MG Tabs tablet ?Commonly known as: XARELTO ?  ? ?  ? ?TAKE these medications   ? ?enoxaparin 100 MG/ML injection ?Commonly known as: LOVENOX ?Inject 1 syringe (133m) into the skin every 12 hours for 15 days. ?  ?fentaNYL 100 MCG/HR ?Commonly known as: DArtesia?Place 2 patches onto the skin every 3 (three) days. Along with 50 MCG/HR patch for a total of 250 MCG/HR dose. ?  ?fentaNYL 50 MCG/HR ?Commonly known as: DL[place]?Place 1 patch onto the skin every 3 (three) days. Along with 2 of 100 MCG/HR patch for a total of 250 MCG/HR dose. ?  ?  HYDROmorphone 1 mg/mL injection ?Commonly known as: DILAUDID ?Administer Hydromorphone IV via CADD PCA ?PCA Bolus dose: 89m ?Lock Out: 15 min ?Total 1 hour dose limit: 455m?Basal: None ?Bag volume and concentration  [facility]. ?Total 7 day: [id number]  ?LORazepam 0.5 MG tablet ?Commonly known as: ATIVAN ?Take 1 tablet (0.5 mg total) by mouth every 6 (six) hours as needed for anxiety. ?  ?pantoprazole sodium 40 mg ?Commonly known as: PROTONIX ?Place 40 mg into feeding tube daily. ?  ?promethazine 25 MG suppository ?Commonly known as: PHENERGAN ?Place 1 suppository (25 mg total) rectally every 6 (six) hours as needed for nausea or vomiting. ?  ?sodium chloride flush 0.9 % Soln ?Commonly known as: NS ?Per hospice protocol ?  ? ?  ? ? ?Allergies  ?Allergen Reactions  ? Oxaliplatin Hives and Other (See Comments)  ?  15 minutes into infusion, patient started to complain of feeling warm and states " it feels like the last time I had my reaction"  ? ? ?Consultations: ?Oncology/palliative care ? ? ?Procedures/Studies: ?DG Abdomen 1 View ? ?Result Date: 12/05/2021 ?CLINICAL DATA:  Evaluate location of gastrostomy tube EXAM: ABDOMEN - 1 VIEW COMPARISON:  09/30/2021 FINDINGS: Contrast injected through the gastrostomy tube is seen in the lumen of stomach. There is no extravasation of contrast. Bowel gas pattern is nonspecific. IMPRESSION: Tip of gastrostomy tube is noted in the lumen of stomach. There is no extravasation of contrast. Electronically Signed   By: PalElmer PickerD.   On: 12/05/2021 14:12  ? ?CT ABDOMEN PELVIS W CONTRAST ? ?Result Date: 12/21/2021 ?CLINICAL DATA:  Metastatic cecal cancer with abdominal pain. Worsening nausea and vomiting, chills and weakness. Hypotensive in triage. Currently undergoing chemotherapy last treatment 3 weeks ago. EXAM: CT ABDOMEN AND PELVIS WITH CONTRAST TECHNIQUE: Multidetector CT imaging of the abdomen and pelvis was performed using the standard protocol following bolus administration of intravenous contrast. RADIATION DOSE REDUCTION: This exam was performed according to the departmental dose-optimization program which includes automated exposure control, adjustment of the mA  and/or kV according to patient size and/or use of iterative reconstruction technique. CONTRAST:  100m34mNIPAQUE IOHEXOL 300 MG/ML  SOLN COMPARISON:  There are multiple prior CTs from last year. The 2 most recent are CTs with IV contrast 09/02/2021 and 08/25/2021. FINDINGS: Lower chest: There is a small layering increased left pleural effusion, previously minimal. There are several scattered tiny nodules along the major fissure in the right lower lung field which were not seen previously and probably intrapulmonary lymph nodes. No parenchymal nodule is seen. There is scattered atelectasis in the bases. The cardiac size is normal. Hepatobiliary: The liver is mildly steatotic but no mass enhancement is seen. The gallbladder distended but has a normal wall thickness. No calcified stones or biliary dilatation. Pancreas: Partially atrophic and otherwise unremarkable. Spleen: No mass enhancement.  No splenomegaly. Adrenals/Urinary Tract: Adrenal glands and kidneys unremarkable. There is no urinary stone or obstruction. There is no bladder thickening. Stomach/Bowel: A PEG tube is inserted since the prior studies. The stomach is decompressed. The duodenum and the most proximal jejunum are unremarkable, but beginning in the proximal jejunum there are again noted clumped small bowel segments adhesive to the anterior wall, with dilatation in the more superior of these loops. The most dilated of these segments is 4 cm, with the more inferior of these segments normal caliber. As before there are multiple angulated segments in the clumped bowel therefore a discrete transitional segment is difficult to  identify. The clumped segments show increased wall thickening and wall enhancement both in the dilated and nondilated segments. The appendix is not seen. The large intestine is decompressed. There are uncomplicated sigmoid diverticula. Vascular/Lymphatic: There is aortic atherosclerosis without aneurysm. There is relatively  increased number of visible mesenteric root nodes compared to prior studies. These are not enlarged by strict size criteria but are larger than previously with largest of these 7.6 mm in short axis. There are shott

## 2021-12-22 NOTE — Progress Notes (Addendum)
[employer]ufacturing [employer] [facility]) Hospital Liaison: RN note   ? ?Notified by Dr. Rhea Pink of patient/family request for [facility] services at home after discharge. Chart and patient information under review by [facility] physician. Hospice eligibility confirmed.  ?  ?Writer spoke with patient's wife, [name] to initiate education related to hospice philosophy, services and team approach to care. Currently [facility] is unable to get Dilaudid for home care patients.  I discussed this with patient's wife and offered [facility] instead as they are able to get Dilaudid IV for patients there. She discussed this with the patient and they are agreeable to transition to [facility].   Wife is going to tour facility this afternoon.  ? ?[facility] is unable to offer a room today. Hospital Liaison will follow up tomorrow or sooner if a room becomes available.  ?       ?A Please do not hesitate to call with questions.   ? ?Thank you,   ?Farrel Gordon, RN, CCM      ?[facility] Hospital Liaison   ?[phone] ?

## 2021-12-22 NOTE — Consult Note (Signed)
Palliative Care ?Consult Note ? ?56 yo with metastatic colon cancer diagnosed 12/2020, status post multiple cycles of chemotherapy, now with worsening peritoneal and omental disease and persistent malignant obstruction.  He was admitted with worsening mental status, accidental removal of his venting PEG tube and severe left leg swelling and pain.  He has a large DVT in his left leg, he also endorses a feeling of shortness of breath and a sensation of suffocation especially when TPN is given.  He has had minimal p.o. intake for greater than 3 weeks, he has intermittent severe nausea vomiting but improved with venting the PEG tube.  We have been following him both inpatient and in the outpatient palliative clinic in the cancer center, he has complex pain management needs. ? ?Today he is dusky appearing, minimally interactive but able to follow conversation. Extremely weak. Co pain in his leg and right upper quadrant. ? ?I met with the patient and his wife [name] to discuss goals of care in the setting of worsening disease I also have discussed his care with Dr. Burr Medico who feels that his cancer is now in the terminal stages and he is no longer a candidate for additional chemotherapy.  Dr. Annamaria Boots is also in agreement with a hospice care approach and feels a focus on his comfort and quality of life is the best treatment plan ahead. ? ?Recommendations: ? ?DO NOT RESUSCITATE.  I clarified this order and will complete a goldenrod form for discharge. ?Overall plan is to discharge him as soon as possible from the hospital home with hospice care.  Patient is clear that he desires an in home death, he understands that his time is very short and could be a matter of days to just a couple of weeks at best.  He also understands that his condition is fragile currently, and because of this would like an expedited discharge if possible once the equipment and PCA can be set up. ?Transition to comfort care: This includes discontinuing TPN,  antibiotics and nonessential medications.  I do recommend that we continue to treat his very large DVT with twice daily Lovenox as a symptom management intervention.  Discontinue his current monitoring devices -CBGs and blood draws. ?Patient will continue his fentanyl patches for basal pain control, I would like to start him on a PCA with a 1 mg bolus of hydromorphone every 15 minutes as needed for pain.  He will need to discharge with a home CADD pump and hopefully hospice can assist with management of this he has port access as well as a double-lumen PICC line. ?Patient will continue his venting PEG tube for symptom management we will ask hospice to assist in making sure there is education around this at home. ?Patient can have ice chips and liquids and very soft foods, as tolerated based on ability to vent his PEG tube. ?In terms of the discharge planning they will need in-home equipment to include a hospital bed, bedside commode and other DME as determined by [facility] and hospice team. ?Discharge we will minimize any p.o. meds, when possible can transition these to liquids and administer after venting the PEG tube. ?Will need ambulance transport home. ?For anxiety we will transition to an Ativan Intensol or Ativan tablets at discharge which can be dissolved and administered sublingually or via PEG tube, discussed making sure the PEG tube is vented and then administering medications to allow for longer absorption time in the stomach. ?  ? ?Lane Hacker, DO ?Palliative Medicine ? ? ?  Time: 120 minutes ?

## 2021-12-22 NOTE — TOC Initial Note (Signed)
Transition of Care (TOC) - Initial/Assessment Note  ? ? ?Patient Details  ?Name: Ricardo [name] ?MRN: [id number] ?Date of Birth: 07/08/1966 ? ?Transition of Care (TOC) CM/SW Contact:    ?Tawanna Cooler, RN ?Phone Number: ?12/22/2021, 8:58 AM ? ?Clinical Narrative:                 ? ?Patient receiving treatment for colon cancer.  Has a PEG and receiving TPN through [place].  On service with [facility] for [facility].  ?TOC following.  ? ?Expected Discharge Plan: [place] ?Barriers to Discharge: Continued Medical Work up ? ?Expected Discharge Plan and Services ?Expected Discharge Plan: [place] ?   ?  ?Living arrangements for the past 2 months: Single Family Home ?                ?  ? ?Prior Living Arrangements/Services ?Living arrangements for the past 2 months: [place] ?Lives with:: Spouse ?Patient language and need for interpreter reviewed:: Yes ?       ?Need for Family Participation in Patient Care: Yes (Comment) ?Care giver support system in place?: Yes (comment) ?  ?Criminal Activity/Legal Involvement Pertinent to Current Situation/Hospitalization: No - Comment as needed ? ?Activities of Daily Living ?Home Assistive Devices/Equipment: Wheelchair ?ADL Screening (condition at time of admission) ?Patient's cognitive ability adequate to safely complete daily activities?: Yes ?Is the patient deaf or have difficulty hearing?: Yes ?Does the patient have difficulty seeing, even when wearing glasses/contacts?: Yes ?Does the patient have difficulty concentrating, remembering, or making decisions?: Yes ?Patient able to express need for assistance with ADLs?: Yes ?Does the patient have difficulty dressing or bathing?: No ?Independently performs ADLs?: Yes (appropriate for developmental age) ?Does the patient have difficulty walking or climbing stairs?: Yes ?Weakness of Legs: Both ?Weakness of Arms/Hands: Both ? ? ?Alcohol / Substance Use: Not Applicable ?Psych Involvement: No  (comment) ? ?Admission diagnosis:  AKI (acute kidney injury) ([place]) [N17.9] ?Patient Active Problem List  ? Diagnosis Date Noted  ? Hypermagnesemia 12/21/2021  ? Hypokalemia 09/30/2021  ? Palliative care by specialist   ? Goals of care, counseling/discussion   ? Peripherally inserted central catheter (PICC) in place 09/19/2021  ? Moderate protein malnutrition ([place]) 09/14/2021  ? Normocytic anemia 09/14/2021  ? Hyperglycemia 09/14/2021  ? Pleural effusion, left 09/06/2021  ? GERD (gastroesophageal reflux disease) 09/03/2021  ? GAD (generalized anxiety disorder) 09/03/2021  ? History of COVID-19 04/30/2021  ? Genetic testing 01/17/2021  ? Dehydration 01/04/2021  ? Abdominal pain 01/04/2021  ? Nausea & vomiting 01/04/2021  ? High anion gap metabolic acidosis 19/41/7408  ? Leukocytosis 01/04/2021  ? AKI (acute kidney injury) ([place]) 01/03/2021  ? Port-A-Cath in place 01/02/2021  ? Family history of breast cancer   ? Family history of prostate cancer   ? 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  ? 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 SBO (small bowel obstruction) ([place]) 11/27/2020  ? Ileitis 11/27/2020  ? Intractable abdominal pain 11/27/2020  ? Nausea and vomiting 11/27/2020  ? Hyperlipidemia 11/27/2020  ? Lymphadenopathy, abdominal 11/27/2020  ? Obesity (BMI 30-39.9) 11/10/2018  ? Arthralgia 11/10/2018  ? Sleep disorder 11/10/2018  ? ?PCP:  [name], MD ?Pharmacy:   ?[id number] [id number]?Phone: [phone]: [phone]? ?[facility] ?[id number]?Phone: [phone]: [phone]? ?  [facility] ?[address] ?[place] [address] ?Phone: [phone] Fax: [phone] ? ? ?Readmission Risk Interventions ? ?  12/22/2021  ?  8:56 AM 09/16/2021  ?  3:08 PM 09/06/2021  ?  1:18 PM  ?Readmission Risk Prevention  Plan  ?Transportation Screening Complete    ?[place] or Home Care Consult   Complete  ?Social Work Consult for [place] Planning/Counseling   Complete  ?Palliative Care Screening   Not Applicable  ?Medication Review Press photographer) Complete    ?[place] or Home Care Consult Complete Complete   ?SW Recovery Care/Counseling Consult Complete Complete   ?Palliative Care Screening Complete Complete   ?[place] Not Applicable Not Applicable   ? ? ? ?

## 2021-12-22 NOTE — Progress Notes (Signed)
Ricardo [name]   DOB:1966/03/06   ZD#:[id number]   QVZ#:[id number] ? ?Oncology follow-up note ? ?Subjective: Patient is well-known to me, under my care for his metastatic colon cancer.  He came to the ED after incidental reported his PEG tube.  I saw him in the hospital, he was very drowsy, I spoke with his wife at the bedside.  ? ? ?Objective:  ?Vitals:  ? 12/22/21 1247 12/22/21 1356  ?BP:  104/76  ?Pulse:  (!) 109  ?Resp: 14 18  ?Temp:  97.7 ?F (36.5 ?C)  ?SpO2: 94% 98%  ?  Body mass index is 28.81 kg/m?. ? ?Intake/Output Summary (Last 24 hours) at 12/22/2021 1715 ?Last data filed at 12/22/2021 0645 ?Gross per 24 hour  ?Intake 1716.7 ml  ?Output 2025 ml  ?Net -308.3 ml  ? ? ? Sclerae unicteric ? No peripheral adenopathy ? Lungs clear -- no rales or rhonchi ? Heart regular rate and rhythm ? Abdomen SOFT, (+) peg tube in place  ? MSK: (+) Left lower extremity edema below knee ?  ? ?CBG (last 3)  ?Recent Labs  ?  12/21/21 ?2059 12/22/21 ?0106 12/22/21 ?3295  ?GLUCAP 97 115* 122*  ? ? ? ?Labs:  ?Urine Studies ?No results for input(s): UHGB, CRYS in the last 72 hours. ? ?Invalid input(s): UACOL, UAPR, USPG, UPH, UTP, UGL, UKET, UBIL, UNIT, UROB, ULEU, UEPI, UWBC, URBC, UBAC, CAST, UCOM, BILUA ? ?Basic Metabolic Panel: ?Recent Labs  ?Lab 12/21/21 ?0309 12/21/21 ?1884 12/22/21 ?0302  ?NA 157* 154* 148*  ?K 3.3* 3.7 3.1*  ?CL 115* 114* 114*  ?CO2 '31 30 28  '$ ?GLUCOSE 104* 137* 115*  ?BUN 88* 81* 61*  ?CREATININE 2.27* 2.28* 1.50*  ?CALCIUM 8.7* 8.2* 7.8*  ?MG 2.8*  --  2.2  ?PHOS  --  4.9* 2.8  ? ?GFR ?Estimated Creatinine Clearance: 64.3 mL/min (A) (by C-G formula based on SCr of 1.5 mg/dL (H)). ?Liver Function Tests: ?Recent Labs  ?Lab 12/21/21 ?0309 12/22/21 ?0302  ?AST 49* 38  ?ALT 41 32  ?ALKPHOS 114 93  ?BILITOT 1.3* 1.0  ?PROT 8.3* 6.6  ?ALBUMIN 2.3* 1.9*  ? ?Recent Labs  ?Lab 12/21/21 ?0309  ?LIPASE 32  ? ?No results for input(s): AMMONIA in the last 168 hours. ?Coagulation profile ?Recent Labs  ?Lab 12/21/21 ?0309  ?INR  1.4*  ? ? ?CBC: ?Recent Labs  ?Lab 12/21/21 ?0309  ?WBC 16.9*  ?NEUTROABS 14.4*  ?HGB 13.1  ?HCT 43.5  ?MCV 94.2  ?PLT 160  ? ?Cardiac Enzymes: ?No results for input(s): CKTOTAL, CKMB, CKMBINDEX, TROPONINI in the last 168 hours. ?BNP: ?Invalid input(s): POCBNP ?CBG: ?Recent Labs  ?Lab 12/21/21 ?1714 12/21/21 ?2059 12/22/21 ?0106 12/22/21 ?1660  ?GLUCAP 79 97 115* 122*  ? ?D-Dimer ?No results for input(s): DDIMER in the last 72 hours. ?Hgb A1c ?No results for input(s): HGBA1C in the last 72 hours. ?Lipid Profile ?Recent Labs  ?  12/22/21 ?0302  ?TRIG 173*  ? ?Thyroid function studies ?No results for input(s): TSH, T4TOTAL, T3FREE, THYROIDAB in the last 72 hours. ? ?Invalid input(s): FREET3 ?Anemia work up ?No results for input(s): VITAMINB12, FOLATE, FERRITIN, TIBC, IRON, RETICCTPCT in the last 72 hours. ?Microbiology ?Recent Results (from the past 240 hour(s))  ?Blood Culture (routine x 2)     Status: None (Preliminary result)  ? Collection Time: 12/21/21  3:05 AM  ? Specimen: BLOOD  ?Result Value Ref Range Status  ? Specimen Description   Final  ?  BLOOD LEFT ANTECUBITAL ?Performed at  [facility], [place] [address] [place], [place] [address] ?  ? Special Requests   Final  ?  BOTTLES DRAWN AEROBIC AND ANAEROBIC Blood Culture adequate volume ?Performed at [facility], [place] [address] [place], [place] [address] ?  ? Culture   Final  ?  NO GROWTH 1 DAY ?Performed at [facility], [place] [address], [place], [place] [address] ?  ? Report Status PENDING  Incomplete  ?Blood Culture (routine x 2)     Status: None (Preliminary result)  ? Collection Time: 12/21/21  4:03 AM  ? Specimen: BLOOD  ?Result Value Ref Range Status  ? Specimen Description   Final  ?  BLOOD BLOOD LEFT HAND ?Performed at [facility], [place] [address] [place], [place] [address] ?  ? Special Requests   Final  ?  BOTTLES DRAWN AEROBIC AND ANAEROBIC Blood Culture adequate volume ?Performed at  [facility], [place] [address] [place], [place] [address] ?  ? Culture   Final  ?  NO GROWTH 1 DAY ?Performed at [facility], [place] [address], [place], [place] [address] ?  ? Report Status PENDING  Incomplete  ?Urine Culture     Status: Abnormal  ? Collection Time: 12/21/21  5:03 AM  ? Specimen: Urine, Clean Catch  ?Result Value Ref Range Status  ? Specimen Description   Final  ?  URINE, CLEAN CATCH ?Performed at [facility], [place] [address], [place], [place] [address] ?  ? Special Requests   Final  ?  NONE ?Performed at [facility], [place] [address], [place], [place] [address] ?  ? Culture (A)  Final  ?  <10,000 COLONIES/mL INSIGNIFICANT GROWTH ?Performed at [facility], [place] [address], [place],  [address] ?  ? Report Status 12/22/2021 FINAL  Final  ? ? ? ? ?Studies:  ?CT ABDOMEN PELVIS W CONTRAST ? ?Result Date: 12/21/2021 ?CLINICAL DATA:  Metastatic cecal cancer with abdominal pain. Worsening nausea and vomiting, chills and weakness. Hypotensive in triage. Currently undergoing chemotherapy last treatment 3 weeks ago. EXAM: CT ABDOMEN AND PELVIS WITH CONTRAST TECHNIQUE: Multidetector CT imaging of the abdomen and pelvis was performed using the standard protocol following bolus administration of intravenous contrast. RADIATION DOSE REDUCTION: This exam was performed according to the departmental dose-optimization program which includes automated exposure control, adjustment of the mA and/or kV according to patient size and/or use of iterative reconstruction technique. CONTRAST:  127m OMNIPAQUE IOHEXOL 300 MG/ML  SOLN COMPARISON:  There are multiple prior CTs from last year. The 2 most recent are CTs with IV contrast 09/02/2021 and 08/25/2021. FINDINGS: Lower chest: There is a small layering increased left pleural effusion, previously minimal. There are several scattered tiny nodules along the major fissure in the right lower lung field which  were not seen previously and probably intrapulmonary lymph nodes. No parenchymal nodule is seen. There is scattered atelectasis in the bases. The cardiac size is normal. Hepatobiliary: The liver is mildly steatotic but no mass enhancement is seen. The gallbladder distended but has a normal wall thickness. No calcified stones or biliary dilatation. Pancreas: Partially atrophic and otherwise unremarkable. Spleen: No mass enhancement.  No splenomegaly. Adrenals/Urinary Tract: Adrenal glands and kidneys unremarkable. There is no urinary stone or obstruction. There is no bladder thickening. Stomach/Bowel: A PEG tube is inserted since the prior studies. The stomach is decompressed. The duodenum and the most proximal jejunum are unremarkable, but  beginning in the proximal jejunum there are again noted clumped small bowel segments adhesive to the anterior wall, with dilatation in the more superior of these loops. The most dilated of these segments is 4 cm, with the more inferior of these segments normal caliber. As before there are multiple angulated segments in the clumped bowel therefore a discrete transitional segment is difficult to identify. The clumped segments show increased wall thickening and wall enhancement both in the dilated and nondilated segments. The appendix is not seen. The large intestine is decompressed. There are uncomplicated sigmoid diverticula. Vascular/Lymphatic: There is aortic atherosclerosis without aneurysm. There is relatively increased number of visible mesenteric root nodes compared to prior studies. These are not enlarged by strict size criteria but are larger than previously with largest of these 7.6 mm in short axis. There are shotty subcentimeter ileocolic mesenteric nodes which are stable. No pelvic adenopathy. Reproductive: No prostatomegaly.The testicles appear to be retracted into the inguinal canals. Other: There are small inguinal fat hernias, larger on the left. Again noted is a  small amount of ascites in the right subhepatic space, and generalized stranding in the omentum which was seen previously and seems unchanged. 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

## 2021-12-22 NOTE — Progress Notes (Signed)
PHARMACY - TOTAL PARENTERAL NUTRITION CONSULT NOTE  ? ?Indication: Small bowel obstruction ? ?Patient Measurements: ?Height: '5\' 11"'$  (180.3 cm) ?Weight: 93.7 kg (206 lb 9.6 oz) ?IBW/kg (Calculated) : 75.3 ?TPN AdjBW (KG): 93.7 ?Body mass index is 28.81 kg/m?. ?Usual Weight:  ? ?Assessment:  ?69 yoM presents to ED on 4/9 with N/V, chills, weakness, hypotension.  He is on chronic TPN, and has venting PEG tube but accidentally dislodged his PEG tube at home.  PMH is significant for metastatic colon cancer, currently undergoing chemotherapy with last treatment 3 weeks ago. ? ?Glucose / Insulin: No hx DM. Hgb A1c 5.5% on 09/14/21 ?- CBG 115 after TPN at 39m/hr started ?Electrolytes: Elevated Na, Cl; Mag, Phos WNL.  CorrCa 9.48 ?- K low (3.1) - now K in TPN last night; KCl 10 mEq IV x2 runs per MD on 4/9 AM. ?Renal: AKI related to dehydration; SCr much improved 2.28 > 1.5 (baseline SCr ~ 0.7), BUN 61 ?Hepatic: AST and Tbili improving to WNL; ALT wnl remains WNL ?- Alb low 1.9 ?Trig: 173 ?Intake / Output: drain 15215m?MIVF:  NaCl 0.45% at 110 ml/hr ?GI Imaging: ?4/9 CT abd: clumping of the small bowel towards the anterior abdomen with some segments adhesive to the abdominal wall.  There is least a low to intermediate grade obstruction involving the jejunum. ?GI Surgeries / Procedures:  ? ?Central access: Implanted port, single lumen CVC ?TPN start date: 12/21/21 ? ?Nutritional Goals: ?TPN formula from [facility]: ?Amino Acids: 145 gm/day ?Dextrose: 360gm/day ?Lipids: 65 gm/day; smoflipids ?2454 total kcal/day ? ?Goal TPN rate is 100 mL/hr (provides 144 g of protein and 2520 kcals per day) ? ?RD Assessment:  ?  ?(Previous RD assessment 10/03/21: Total Energy Estimated Needs: 2365-2600 kcal, Total Protein Estimated Needs: 120-135 grams, Total Fluid Estimated Needs: >/= 2.5 L/day) ? ?Current Nutrition:  ?NPO ? ?Plan:  ? ?Now: KCl 109mIV x 4 runs ? ?Increase TPN to 75 mL/hr at 1800 ?Electrolytes in TPN:   ?Na 0 mEq/L ?K  10 mEq/L ?Ca 2mE59m ?Mg 2mEq53m?Phos 5mmol77m?Cl:Ac max acetate ?Add standard MVI and trace elements to TPN ?Initiate Sensitive q8h SSI and adjust as needed  ?Reduce MIVF to 75 mL/hr at 1800 ?Monitor TPN labs on Mon/Thurs ? ?Shloima Clinch WPeggyann JubamD, BCPS ?[facility] main pharmacy [phone] 7:05 AM ? ? ?

## 2021-12-22 NOTE — Progress Notes (Signed)
?PROGRESS NOTE ? ? ? ?Ricardo [name]  WPY:[id number] DOB: 1966/06/01 DOA: 12/21/2021 ?PCP: [name], MD  ? 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  On presentation, he was tachycardic, hypotensive, tachypneic and was found to have leukocytosis with lactic acid of 2.2 then subsequently 3.  Creatinine 2.27.  Chest x-ray was negative for acute abnormality. CT abdomen/pelvis showed continued small bowel obstruction with omental metastases.  He was started on IV fluids and antibiotics empirically. ? ?Assessment & Plan: ?  ?Acute kidney injury ?-From dehydration and malfunctioning PEG tube.  Creatinine 2.27 on presentation.  Creatinine 1.50 today.  Monitor ? ?Persistent small bowel obstruction ?Omental metastases ?Stage IV colon cancer with metastases and peritoneal carcinomatosis ?Dislodged PEG tube ?-PEG tube was reinserted on presentation and subsequently started draining.  He has a venting PEG tube for persistent small bowel obstruction.  CT of the abdomen and pelvis as above. ?-Patient was empirically started on IV fluids and antibiotics in the ED which have been continued ?-Currently in severe pain requiring extremely high doses of pain medications even at home.  Continue IV Dilaudid and fentanyl patch.  Pharmacy consulted to resume TPN. ?-Not a surgical candidate ?-Oncology/palliative care consulted.  Overall prognosis is very poor: Consider hospice/end-of-life care ? ?Acute left lower extremity DVT ?-As evidenced in lower extremity duplex.  Start therapeutic dosing of Lovenox twice a day: will help with symptom management ? ?Leukocytosis ?-Possibly reactive. ? ?Hypernatremia ?-Possibly from dehydration.   Improving ? ?Hypokalemia ?-Replace.  Repeat a.m. labs ? ?Hypoalbuminemia ?Possible severe malnutrition ?-Possibly from above.  Overall prognosis is very poor ? ?GERD ?-Continue Protonix ? ?DVT prophylaxis: Lovenox ?Code Status: Partial ?Family Communication: Wife at bedside ?Disposition Plan: ?Status is: Inpatient ?Remains inpatient appropriate because: Of severity of illness ? ? ? ?Consultants: Oncology/palliative care ? ?Procedures: None ? ?Antimicrobials:  ?Anti-infectives (From admission, onward)  ? ? Start     Dose/Rate Route Frequency Ordered Stop  ? 12/22/21 1200  ceFEPIme (MAXIPIME) 2 g in sodium chloride 0.9 % 100 mL IVPB  Status:  Discontinued       ? 2 g ?200 mL/hr over 30 Minutes Intravenous Every 8 hours 12/22/21 0915 12/22/21 1034  ? 12/21/21 1730  metroNIDAZOLE (FLAGYL) IVPB 500 mg  Status:  Discontinued       ? 500 mg ?100 mL/hr over 60 Minutes Intravenous Every 12 hours 12/21/21 1642 12/22/21 1021  ? 12/21/21 1730  ceFEPIme (MAXIPIME) 2 g in sodium chloride 0.9 % 100 mL IVPB  Status:  Discontinued       ? 2 g ?200 mL/hr over 30 Minutes Intravenous Every 8 hours 12/21/21 1642 12/21/21 1644  ? 12/21/21 1730  ceFEPIme (MAXIPIME) 2 g in sodium chloride 0.9 % 100 mL IVPB  Status:  Discontinued       ? 2 g ?200 mL/hr over 30 Minutes Intravenous Every 12 hours 12/21/21 1644 12/22/21 0915  ? 12/21/21 1715  piperacillin-tazobactam (ZOSYN) IVPB 3.375 g  Status:  Discontinued       ? 3.375 g ?100 mL/hr over 30 Minutes Intravenous Every 8 hours 12/21/21 1627 12/21/21 1639  ? 12/21/21 0230  piperacillin-tazobactam (ZOSYN) IVPB 3.375 g       ? 3.375 g ?100 mL/hr over 30 Minutes Intravenous  Once 12/21/21 0223  12/21/21 0348  ? ?  ? ? ? ?Subjective: ?Patient seen and examined at bedside.  Complains of severe intermittent abdominal pain along with anxiety.  Also has intermittent vomiting.  No fever, seizures or agitation reported. ? ?Objective: ?Vitals:  ? 12/22/21 0500 12/22/21 0627 12/22/21 0800 12/22/21 0805   ?BP: 94/68 94/73 (!) 89/71 (!) 88/67  ?Pulse: (!) 104 100 (!) 102 (!) 104  ?Resp: 13 14 (!) 22 14  ?Temp: 98.3 ?F (36.8 ?C)     ?TempSrc: Oral     ?SpO2: 100% 95% 96% 95%  ?Weight:      ?Height:      ? ? ?Intake/Output Summary (Last 24 hours) at 12/22/2021 1246 ?Last data filed at 12/22/2021 0645 ?Gross per 24 hour  ?Intake 2716.7 ml  ?Output 2025 ml  ?Net 691.7 ml  ? ?Filed Weights  ? 12/21/21 0209 12/21/21 1407  ?Weight: 93.9 kg 93.7 kg  ? ? ?Examination: ? ?General exam: Looks chronically ill and deconditioned.  Currently on room air.  ? Respiratory system: Bilateral decreased breath sounds at bases with scattered crackles and intermittently tachypneic ?Cardiovascular system: S1 & S2 heard, tachycardic  ?gastrointestinal system: Abdomen is tented, soft and diffusely tender.  Bowel sounds sluggish.  Venting PEG tube present  ?extremities: Trace lower extremity edema; no clubbing  ?Central nervous system: Awake and alert.  No focal neurological deficits.  Moves extremities  ?skin: No obvious petechiae/lesions ?Psychiatry: Affect is mostly flat.  No signs of agitation. ? ? ?Data Reviewed: I have personally reviewed following labs and imaging studies ? ?CBC: ?Recent Labs  ?Lab 12/21/21 ?0309  ?WBC 16.9*  ?NEUTROABS 14.4*  ?HGB 13.1  ?HCT 43.5  ?MCV 94.2  ?PLT 160  ? ?Basic Metabolic Panel: ?Recent Labs  ?Lab 12/21/21 ?0309 12/21/21 ?7342 12/22/21 ?0302  ?NA 157* 154* 148*  ?K 3.3* 3.7 3.1*  ?CL 115* 114* 114*  ?CO2 '31 30 28  '$ ?GLUCOSE 104* 137* 115*  ?BUN 88* 81* 61*  ?CREATININE 2.27* 2.28* 1.50*  ?CALCIUM 8.7* 8.2* 7.8*  ?MG 2.8*  --  2.2  ?PHOS  --  4.9* 2.8  ? ?GFR: ?Estimated Creatinine Clearance: 64.3 mL/min (A) (by C-G formula based on SCr of 1.5 mg/dL (H)). ?Liver Function Tests: ?Recent Labs  ?Lab 12/21/21 ?0309 12/22/21 ?0302  ?AST 49* 38  ?ALT 41 32  ?ALKPHOS 114 93  ?BILITOT 1.3* 1.0  ?PROT 8.3* 6.6  ?ALBUMIN 2.3* 1.9*  ? ?Recent Labs  ?Lab 12/21/21 ?0309  ?LIPASE 32  ? ?No results for input(s): AMMONIA in  the last 168 hours. ?Coagulation Profile: ?Recent Labs  ?Lab 12/21/21 ?0309  ?INR 1.4*  ? ?Cardiac Enzymes: ?No results for input(s): CKTOTAL, CKMB, CKMBINDEX, TROPONINI in the last 168 hours. ?BNP (last 3 results) ?No results for input(s): PROBNP in the last 8760 hours. ?HbA1C: ?No results for input(s): HGBA1C in the last 72 hours. ?CBG: ?Recent Labs  ?Lab 12/21/21 ?1714 12/21/21 ?2059 12/22/21 ?0106 12/22/21 ?8768  ?GLUCAP 79 97 115* 122*  ? ?Lipid Profile: ?Recent Labs  ?  12/22/21 ?0302  ?TRIG 173*  ? ?Thyroid Function Tests: ?No results for input(s): TSH, T4TOTAL, FREET4, T3FREE, THYROIDAB in the last 72 hours. ?Anemia Panel: ?No results for input(s): VITAMINB12, FOLATE, FERRITIN, TIBC, IRON, RETICCTPCT in the last 72 hours. ?Sepsis Labs: ?Recent Labs  ?Lab 12/21/21 ?0309 12/21/21 ?0601 12/21/21 ?1255  ?LATICACIDVEN 2.2* 3.0* 1.4  ? ? ?Recent Results (from the past 240 hour(s))  ?Urine Culture     Status: Abnormal  ?  Collection Time: 12/21/21  5:03 AM  ? Specimen: Urine, Clean Catch  ?Result Value Ref Range Status  ? Specimen Description   Final  ?  URINE, CLEAN CATCH ?Performed at [facility], [place] [address] [place], [place] [address] ?  ? Special Requests   Final  ?  NONE ?Performed at [facility], [place] [address], [place], [place] [address] ?  ? Culture (A)  Final  ?  <10,000 COLONIES/mL INSIGNIFICANT GROWTH ?Performed at [facility], [place] [address], [place], [place] [address] ?  ? Report Status 12/22/2021 FINAL  Final  ?  ? ? ? ? ? ?Radiology Studies: ?CT ABDOMEN PELVIS W CONTRAST ? ?Result Date: 12/21/2021 ?CLINICAL DATA:  Metastatic cecal cancer with abdominal pain. Worsening nausea and vomiting, chills and weakness. Hypotensive in triage. Currently undergoing chemotherapy last treatment 3 weeks ago. EXAM: CT ABDOMEN AND PELVIS WITH CONTRAST TECHNIQUE: Multidetector CT imaging of the abdomen and pelvis was performed using the standard protocol following bolus  administration of intravenous contrast. RADIATION DOSE REDUCTION: This exam was performed according to the departmental dose-optimization program which includes automated exposure control, adjustment of the m

## 2021-12-23 ENCOUNTER — Inpatient Hospital Stay: Payer: BC Managed Care – PPO

## 2021-12-23 ENCOUNTER — Other Ambulatory Visit (HOSPITAL_COMMUNITY): Payer: Self-pay

## 2021-12-23 DIAGNOSIS — Z515 Encounter for palliative care: Secondary | ICD-10-CM

## 2021-12-23 DIAGNOSIS — N179 Acute kidney failure, unspecified: Secondary | ICD-10-CM | POA: Diagnosis not present

## 2021-12-23 DIAGNOSIS — K56609 Unspecified intestinal obstruction, unspecified as to partial versus complete obstruction: Secondary | ICD-10-CM | POA: Diagnosis not present

## 2021-12-23 NOTE — Progress Notes (Signed)
Chaplain engaged in an initial visit with Ricardo [name] and his family.  Chaplain introduced herself and then went over reason for consult.  Ricardo [name] had wanted to utilize a Patent examiner to sign documents outside of healthcare.  Chaplain explained that our hospital notaries would not be able to complete those documents but that he could call in a mobile notary.  Chaplains are able to help secure witnesses but family would have to find their own notary.  Ricardo [name] explained understanding.   ? ?Chaplain checked in with Ricardo [name] who wanted a Coke to drink.  Chaplain was able to take him something to drink.   ? ?Chaplain provided education around hospital policies around notarizing documents, provided a compassionate presence, provided service, and reflective listening.  ? ? ? 12/23/21 1600  ?Clinical Encounter Type  ?Visited With Patient and family together  ?Visit Type Initial;Social support  ? ? ?

## 2021-12-23 NOTE — TOC Progression Note (Signed)
Transition of Care (TOC) - Progression Note  ? ? ?Patient Details  ?Name: Ricardo [name] ?MRN: [id number] ?Date of Birth: [birth year] ? ?Transition of Care (TOC) CM/SW Contact  ?Purcell Mouton, RN ?Phone Number: ?12/23/2021, 1:33 PM ? ?Clinical Narrative:    ? ?Spoke with pt, wife and family at bedside. Pt will go home with [facility], pt asked for WC ? ?Expected Discharge Plan: [place] ?Barriers to Discharge: Continued Medical Work up ? ?Expected Discharge Plan and Services ?Expected Discharge Plan: [place] ?  ?  ?  ?Living arrangements for the past 2 months: [place] ?                ?  ?  ?  ?  ?  ?  ?  ?  ?  ?  ? ? ?Social Determinants of Health (SDOH) Interventions ?  ? ?Readmission Risk Interventions ? ?  12/22/2021  ?  8:56 AM 09/16/2021  ?  3:08 PM 09/06/2021  ?  1:18 PM  ?Readmission Risk Prevention Plan  ?Transportation Screening Complete    ?[place] or Home Care Consult   Complete  ?Social Work Consult for [place] Planning/Counseling   Complete  ?Palliative Care Screening   Not Applicable  ?Medication Review Press photographer) Complete    ?[place] or Home Care Consult Complete Complete   ?SW Recovery Care/Counseling Consult Complete Complete   ?Palliative Care Screening Complete Complete   ?[place] Not Applicable Not Applicable   ? ? ?

## 2021-12-23 NOTE — Progress Notes (Signed)
Manufacturing engineer [facility]) Hospital Liaison: RN Liaison note ? ?Notified by Dr. Rhea Pink of patient/family request for [facility] services at home after discharge. Chart and patient information under review by [place] physician. Hospice eligibility confirmed. ? ?Met with patient and family at bedside, patient was comfortable in bed, alert and talking with family, answering appropriate questions. There has been discussion about IPU vs Home but the patient is really wanting to discharge home. We are in the process of getting the medication for home: Hilma Favors is working on this. We will wait to here back on when this is available to get the patient connected and discharged home. Possibly discharged home tomorrow.  ? ?DME needed in home: Transport Wheelchair, has been ordered to the home.  ? ?[facility] is aware of the changes and we are available to assist with any hospice discharge needs. ? ?Clementeen Hoof, BSN, RN ?Hospital Liaison ?[phone] ? ? ? ?

## 2021-12-23 NOTE — Plan of Care (Signed)
?  Problem: Health Behavior/Discharge Planning: ?Goal: Ability to manage health-related needs will improve ?Outcome: Progressing ?  ?Problem: Clinical Measurements: ?Goal: Ability to maintain clinical measurements within normal limits will improve ?Outcome: Progressing ?  ?Problem: Pain Managment: ?Goal: General experience of comfort will improve ?Outcome: Progressing ?  ?

## 2021-12-23 NOTE — Progress Notes (Signed)
?PROGRESS NOTE ? ? ? ?Ricardo [name]  VCB:[id number] DOB: [birth year] DOA: 12/21/2021 ?PCP: [name], MD  ? 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  On presentation, he was tachycardic, hypotensive, tachypneic and was found to have leukocytosis with lactic acid of 2.2 then subsequently 3.  Creatinine 2.27.  Chest x-ray was negative for acute abnormality. CT abdomen/pelvis showed continued small bowel obstruction with omental metastases.  He was started on IV fluids and antibiotics empirically.  Oncology and palliative care were consulted.  He was subsequently found to have acute lower extremity DVT for which she was started on Lovenox therapeutic dosing twice a day.  Patient/family subsequently agreeable for home hospice.  Patient has been started on Dilaudid PCA pump which will be continued on discharge.  He will be discharged to residential hospice once arrangements have been made. ? ?Assessment & Plan: ?Comfort measures only  ?Acute kidney injury ?Persistent small bowel obstruction ?Omental metastases ?Stage IV colon cancer with metastases and peritoneal carcinomatosis ?Dislodged PEG tube ?Acute left lower extremity DVT ?Leukocytosis ?Hyponatremia ?Hypokalemia ?Hypoalbuminemia ?Possible severe malnutrition ?GERD ?Hyperlipidemia ?Generalized anxiety disorder ? ?Plan ?-As discussed above, after discussion with palliative care team, patient has been started on Dilaudid PCA pump.  This will be continued on discharge.  He will be discharged to residential hospice once arrangements have been made.  Lovenox has been started which also will be continued for palliation as per palliative care team.  He will be  allowed to have ice chips and liquids and very soft foods, as tolerated based on the ability to vent his PEG tube. ? ? ?DVT prophylaxis: Lovenox ?Code Status: DNR ?Family Communication: Wife at bedside ?Disposition Plan: ?Status is: Inpatient ?Remains inpatient appropriate because: Of need for residential hospice placement ? ? ? ?Consultants: Palliative care/oncology ? ?Procedures: None ? ?Antimicrobials: None ? ? ?Subjective: ?Patient seen and examined at bedside.  No distress currently.  Is anxious to be discharged. ? ?Objective: ?Vitals:  ? 12/22/21 2125 12/22/21 2143 12/23/21 0028 12/23/21 0420  ?BP: 102/77     ?Pulse: (!) 107     ?Resp: '17 18 20 18  '$ ?Temp: 98.5 ?F (36.9 ?C)     ?TempSrc: Oral     ?SpO2: 96%   95%  ?Weight:      ?Height:      ? ? ?Intake/Output Summary (Last 24 hours) at 12/23/2021 0809 ?Last data filed at 12/23/2021 0420 ?Gross per 24 hour  ?Intake 121 ml  ?Output 2500 ml  ?Net -2379 ml  ? ?Filed Weights  ? 12/21/21 0209 12/21/21 1407  ?Weight: 93.9 kg 93.7 kg  ? ? ?Examination: ? ?General exam: Appears calm and comfortable.  Awake and answers questions.  Chronically ill looking and deconditioned.  Currently in no distress.  No signs of agitation. ? ? ? ?Data Reviewed: I have personally reviewed following labs and imaging studies ? ?CBC: ?Recent Labs  ?Lab 12/21/21 ?0309  ?WBC 16.9*  ?NEUTROABS 14.4*  ?HGB 13.1  ?HCT 43.5  ?MCV 94.2  ?PLT 160  ? ?Basic Metabolic Panel: ?Recent Labs  ?Lab 12/21/21 ?0309 12/21/21 ?3846 12/22/21 ?0302  ?NA 157* 154* 148*  ?K 3.3* 3.7 3.1*  ?CL 115* 114* 114*  ?CO2 '31 30 28  '$ ?GLUCOSE 104* 137* 115*  ?BUN 88*  81* 61*  ?CREATININE 2.27* 2.28* 1.50*  ?CALCIUM 8.7* 8.2* 7.8*  ?MG 2.8*  --  2.2  ?PHOS  --  4.9* 2.8  ? ?GFR: ?Estimated Creatinine Clearance: 64.3 mL/min (A) (by C-G formula based on SCr of 1.5 mg/dL (H)). ?Liver Function Tests: ?Recent Labs  ?Lab 12/21/21 ?0309 12/22/21 ?0302  ?AST 49* 38  ?ALT 41 32  ?ALKPHOS 114 93  ?BILITOT 1.3* 1.0  ?PROT 8.3* 6.6   ?ALBUMIN 2.3* 1.9*  ? ?Recent Labs  ?Lab 12/21/21 ?0309  ?LIPASE 32  ? ?No results for input(s): AMMONIA in the last 168 hours. ?Coagulation Profile: ?Recent Labs  ?Lab 12/21/21 ?0309  ?INR 1.4*  ? ?Cardiac Enzymes: ?No results for input(s): CKTOTAL, CKMB, CKMBINDEX, TROPONINI in the last 168 hours. ?BNP (last 3 results) ?No results for input(s): PROBNP in the last 8760 hours. ?HbA1C: ?No results for input(s): HGBA1C in the last 72 hours. ?CBG: ?Recent Labs  ?Lab 12/21/21 ?1714 12/21/21 ?2059 12/22/21 ?0106 12/22/21 ?1017  ?GLUCAP 79 97 115* 122*  ? ?Lipid Profile: ?Recent Labs  ?  12/22/21 ?0302  ?TRIG 173*  ? ?Thyroid Function Tests: ?No results for input(s): TSH, T4TOTAL, FREET4, T3FREE, THYROIDAB in the last 72 hours. ?Anemia Panel: ?No results for input(s): VITAMINB12, FOLATE, FERRITIN, TIBC, IRON, RETICCTPCT in the last 72 hours. ?Sepsis Labs: ?Recent Labs  ?Lab 12/21/21 ?0309 12/21/21 ?0601 12/21/21 ?1255  ?LATICACIDVEN 2.2* 3.0* 1.4  ? ? ?Recent Results (from the past 240 hour(s))  ?Blood Culture (routine x 2)     Status: None (Preliminary result)  ? Collection Time: 12/21/21  3:05 AM  ? Specimen: BLOOD  ?Result Value Ref Range Status  ? Specimen Description   Final  ?  BLOOD LEFT ANTECUBITAL ?Performed at [facility], [place] [address] [place], [place] [address] ?  ? Special Requests   Final  ?  BOTTLES DRAWN AEROBIC AND ANAEROBIC Blood Culture adequate volume ?Performed at [facility], [place] [address] [place], [place] [address] ?  ? Culture   Final  ?  NO GROWTH 1 DAY ?Performed at [facility], [place] [address] [place], [place] [address] ?  ? Report Status PENDING  Incomplete  ?Blood Culture (routine x 2)     Status: None (Preliminary result)  ? Collection Time: 12/21/21  4:03 AM  ? Specimen: BLOOD  ?Result Value Ref Range Status  ? Specimen Description   Final  ?  BLOOD BLOOD LEFT HAND ?Performed at [facility], [place] [address],  [place], [place] [address] ?  ? Special Requests   Final  ?  BOTTLES DRAWN AEROBIC AND ANAEROBIC Blood Culture adequate volume ?Performed at [facility], [place] [address], [place], [place] [address] ?  ? Culture   Final  ?  NO GROWTH 1 DAY ?Performed at [facility], [place] [address], [place], [place] [address] ?  ? Report Status PENDING  Incomplete  ?Urine Culture     Status: Abnormal  ? Collection Time: 12/21/21  5:03 AM  ? Specimen: Urine, Clean Catch  ?Result Value Ref Range Status  ? Specimen Description   Final  ?  URINE, CLEAN CATCH ?Performed at [facility], [place] [address], [place], [place] [address] ?  ? Special Requests   Final  ?  NONE ?Performed at [facility], [place] [address], [place],  [address] ?  ? Culture (A)  Final  ?  <10,000 COLONIES/mL INSIGNIFICANT  GROWTH ?Performed at [facility], [place] [address] [place], [place] [address] ?  ? Report Status 12/22/2021 FINAL  Final  ?  ? ? ? ? ? ?Radiology Studies: ?VAS [place] LOWER EXTREMITY VENOUS (DVT) ? ?Result Date: 12/22/2021 ? Lower Venous DVT Study Patient Name:  Ricardo [name]  Date of Exam:   12/22/2021 Medical Rec #: [id number]        Accession #:    [id number] Date of Birth: May 20, 1966         Patient Gender: M Patient Age:   56 years Exam Location:  [facility] Procedure:      VAS [place] LOWER EXTREMITY VENOUS (DVT) Referring Phys: Rufina Falco --------------------------------------------------------------------------------  Indications: Swelling.  Risk Factors: Cancer. Limitations: Poor ultrasound/tissue interface. Comparison Study: No prior studies. Performing Technologist: Oliver Hum RVT  Examination Guidelines: A complete evaluation includes B-mode imaging, spectral Doppler, color Doppler, and power Doppler as needed of all accessible portions of each vessel. Bilateral testing is considered an integral part of a complete examination. Limited examinations for reoccurring  indications may be performed as noted. The reflux portion of the exam is performed with the patient in reverse Trendelenburg.  +---------+---------------+---------+-----------+----------+--------------+ RIGHT    Compres

## 2021-12-24 MED ORDER — HEPARIN SOD (PORK) LOCK FLUSH 100 UNIT/ML IV SOLN
500.0000 [IU] | INTRAVENOUS | Status: DC | PRN
  Filled 2021-12-24: qty 5

## 2021-12-24 MED ORDER — HEPARIN SOD (PORK) LOCK FLUSH 100 UNIT/ML IV SOLN
250.0000 [IU] | INTRAVENOUS | Status: AC | PRN
  Administered 2021-12-24: 250 [IU]
  Filled 2021-12-24: qty 2.5

## 2021-12-24 NOTE — TOC Progression Note (Signed)
Transition of Care (TOC) - Progression Note  ? ? ?Patient Details  ?Name: Ricardo [name] ?MRN: [id number] ?Date of Birth: 29-May-1966 ? ?Transition of Care (TOC) CM/SW Contact  ?Purcell Mouton, RN ?Phone Number: ?12/24/2021, 3:14 PM ? ?Clinical Narrative:    ? ?Spoke with pt's wife to explain [facility] RN will be in the home between [place].  ? ?Expected Discharge Plan: [place] ?Barriers to Discharge: Continued Medical Work up ? ?Expected Discharge Plan and Services ?Expected Discharge Plan: [place] ?  ?  ?  ?Living arrangements for the past 2 months: [place] ?                ?  ?  ?  ?  ?  ?  ?  ?  ?  ?  ? ? ?Social Determinants of Health (SDOH) Interventions ?  ? ?Readmission Risk Interventions ? ?  12/22/2021  ?  8:56 AM 09/16/2021  ?  3:08 PM 09/06/2021  ?  1:18 PM  ?Readmission Risk Prevention Plan  ?Transportation Screening Complete    ?[place] or Home Care Consult   Complete  ?Social Work Consult for [place] Planning/Counseling   Complete  ?Palliative Care Screening   Not Applicable  ?Medication Review Press photographer) Complete    ?[place] or Home Care Consult Complete Complete   ?SW Recovery Care/Counseling Consult Complete Complete   ?Palliative Care Screening Complete Complete   ?[place] Not Applicable Not Applicable   ? ? ?

## 2021-12-24 NOTE — TOC Progression Note (Signed)
Transition of Care (TOC) - Progression Note  ? ? ?Patient Details  ?Name: DEV DHONDT ?MRN: [id number] ?Date of Birth: [birth year] ? ?Transition of Care (TOC) CM/SW Contact  ?Purcell Mouton, RN ?Phone Number: ?12/24/2021, 11:25 AM ? ?Clinical Narrative:    ? ?Prescription faxed to [facility] pharm/[facility] request. Fax [phone]. ? ?Expected Discharge Plan: [place] ?Barriers to Discharge: Continued Medical Work up ? ?Expected Discharge Plan and Services ?Expected Discharge Plan: [place] ?  ?  ?  ?Living arrangements for the past 2 months: [place] ?                ?  ?  ?  ?  ?  ?  ?  ?  ?  ?  ? ? ?Social Determinants of Health (SDOH) Interventions ?  ? ?Readmission Risk Interventions ? ?  12/22/2021  ?  8:56 AM 09/16/2021  ?  3:08 PM 09/06/2021  ?  1:18 PM  ?Readmission Risk Prevention Plan  ?Transportation Screening Complete    ?[place] or Home Care Consult   Complete  ?Social Work Consult for [place] Planning/Counseling   Complete  ?Palliative Care Screening   Not Applicable  ?Medication Review Press photographer) Complete    ?[place] or Home Care Consult Complete Complete   ?SW Recovery Care/Counseling Consult Complete Complete   ?Palliative Care Screening Complete Complete   ?[place] Not Applicable Not Applicable   ? ? ?

## 2021-12-24 NOTE — Progress Notes (Signed)
Palliative Care ?Progress Note ? ?Ricardo [name] is more alert and comfortable today. He is doing well with his current PCA dosing and feels this is the best control he has had.He is anxious to go home. We discussed goals of care yesterday and I provided time today to answer any questions that he had after our meeting. They are agreeable to hospice care at home, they do not desire IPU. The main issue is obtaining IV hydromorphone for PCA use in the home. There is a Cabin crew and we are working with [employer], [facility] and Hospice to obtain medication for him.He will need to stay inpatient until this can be obtained. ? ?Plan: ?Plan is for home with hospice once IV PCA meds can be obtained for home use. ? ? ?Lane Hacker, DO ?Palliative Medicine ? ? ?Time: 50 min ?

## 2021-12-24 NOTE — Progress Notes (Signed)
ANTICOAGULATION CONSULT NOTE - Follow Up Consult ? ?Pharmacy Consult for Lovenox ?Indication: DVT ? ?Allergies  ?Allergen Reactions  ? Oxaliplatin Hives and Other (See Comments)  ?  15 minutes into infusion, patient started to complain of feeling warm and states " it feels like the last time I had my reaction"  ? ? ?Patient Measurements: ?Height: '5\' 11"'$  (180.3 cm) ?Weight: 93.7 kg (206 lb 9.6 oz) ?IBW/kg (Calculated) : 75.3  ? ?Vital Signs: ?Temp: 98.1 ?F (36.7 ?C) (04/11 2012) ?Temp Source: Oral (04/11 2012) ?BP: 115/81 (04/11 2012) ?Pulse Rate: 110 (04/11 2012) ? ?Labs: ?Recent Labs  ?  12/21/21 ?0748 12/22/21 ?0302  ?CREATININE 2.28* 1.50*  ? ? ?Estimated Creatinine Clearance: 64.3 mL/min (A) (by C-G formula based on SCr of 1.5 mg/dL (H)). ? ? ?Assessment: ?AC/Heme: PTA Xarelto daily for LUE DVT (dx 11/10/21) ?- 4/9 Start Enox 1 mg/kg BID while NPO. CBC WNL 4/9 ? ?Goal of Therapy:  ?Anti-Xa level 0.6-1 units/ml 4hrs after LMWH dose given ?Monitor platelets by anticoagulation protocol: Yes ?  ?Plan:  ?- Lovenox '95mg'$ /12 hrs for DVT ?CBC q72h while on LMWH ? ?Gennavieve Huq S. Alford Highland, PharmD, BCPS ?Clinical Staff Pharmacist ?[email or web address] ? ?Alford Highland, [employer] ?12/24/2021,7:26 AM ? ? ?

## 2021-12-24 NOTE — Progress Notes (Signed)
Palliative Care ?Progress Note ? ?Primary Dx: Metastatic Colon Cancer, terminal stage ? ?No complaints this AM, except for some nausea last PM. He has significant output from his venting PEG which is to gravity. ? ?Pain is controlled on Fentanyl TD and hydromorphone PCA. ? ?Wife is working on getting some of their affairs in order and a LWT notarized. They are anxiously awaiting confirmation that he can go home today. IV Hydromorphone has been obtained from an [facility] infusion pharmacy that serves hospice in his area. ? ?Discussed discharge plan and reassured them that hospice services would help get them situated at home. His wife is asking about a hospital bed. She knows how to give lovenox. They will need education on how to vent PEG with syringe if he is having nausea. Will send with PR and PO nausea meds. ? ?Script has been faxed to infusion pharmacy. Hospice to arrange DME. ? ?He looks stable and strong enough to go by car if family ok with that. ? ?Will follow for any additional needs. ? ?Lane Hacker, DO ?Palliative Medicine ? ? ?Time:50 min ?

## 2021-12-24 NOTE — Discharge Instructions (Addendum)
Ricardo [name], ? ?You were in the hospital with issues managing pain and after accidentally removing your PEG tube. You have been set up with home hospice services, in addition to home pain medications. It was a pleasure seeing you and your wife again, however. ?

## 2021-12-24 NOTE — Progress Notes (Signed)
Manufacturing engineer [facility]) Hospital Liaison: RN Liaison note ? ?This patient is scheduled to admit this evening at [facility].  [facility] admission nurse will meet at patient's home to set up Dilaudid infusion and complete hospice admission.  ? ?DME ordered: transport W/C, Hospital bed and over bed table and scheduled for delivery today. ? ?Please send signed and completed DNR form home with patient/family. Patient will need prescriptions for any additional comfort medications.    ? ?[facility] aware of the above. Please notify [facility] when patient is ready to leave the unit at discharge. (Call [phone] or [phone] after 5pm.) [facility] information and contact numbers given to wife. ?     ?A Please do not hesitate to call with questions.   ?Thank you,   ?Farrel Gordon, RN, CCM      ?[facility] Hospital Liaison   [phone] ?

## 2021-12-24 NOTE — Progress Notes (Signed)
Spoke with Latrice RN re: discharge plan. DL CVC flushed with heparin per MAR. PORT flushed with NS. Notified patient and wife that PORT only Normal Saline instilled and to use for pain medications. Notified Latrice RN regarding the flushes per lines. Requested order for confirmation patient to go home with PORT accessed and CVC. Confirmed by Amador Cunas RN. Patient to go home with PORT accessed along with DL CVC. Fran Lowes, RN VAST ?

## 2021-12-24 NOTE — TOC Progression Note (Signed)
Transition of Care (TOC) - Progression Note  ? ? ?Patient Details  ?Name: Ricardo [name] ?MRN: [id number] ?Date of Birth: 1966-05-06 ? ?Transition of Care (TOC) CM/SW Contact  ?Purcell Mouton, RN ?Phone Number: ?12/24/2021, 2:45 PM ? ?Clinical Narrative:    ?Spoke with family at bedside. Pt's wife had gone home for bed delivery. Explained that pt will need to be home between 1800-1830 for hospice nurse to be there. A call was made to pt's wife to explain this information. Left VM. ? ? ?Expected Discharge Plan: [place] ?Barriers to Discharge: Continued Medical Work up ? ?Expected Discharge Plan and Services ?Expected Discharge Plan: [place] ?  ?  ?  ?Living arrangements for the past 2 months: [place] ?                ?  ?  ?  ?  ?  ?  ?  ?  ?  ?  ? ? ?Social Determinants of Health (SDOH) Interventions ?  ? ?Readmission Risk Interventions ? ?  12/22/2021  ?  8:56 AM 09/16/2021  ?  3:08 PM 09/06/2021  ?  1:18 PM  ?Readmission Risk Prevention Plan  ?Transportation Screening Complete    ?[place] or Home Care Consult   Complete  ?Social Work Consult for [place] Planning/Counseling   Complete  ?Palliative Care Screening   Not Applicable  ?Medication Review Press photographer) Complete    ?[place] or Home Care Consult Complete Complete   ?SW Recovery Care/Counseling Consult Complete Complete   ?Palliative Care Screening Complete Complete   ?[place] Not Applicable Not Applicable   ? ? ?

## 2021-12-24 NOTE — Plan of Care (Signed)
?  Problem: Education: ?Goal: Knowledge of General Education information will improve ?Description: Including pain rating scale, medication(s)/side effects and non-pharmacologic comfort measures ?Outcome: Adequate for Discharge ?  ?Problem: Health Behavior/Discharge Planning: ?Goal: Ability to manage health-related needs will improve ?Outcome: Adequate for Discharge ?  ?Problem: Clinical Measurements: ?Goal: Ability to maintain clinical measurements within normal limits will improve ?Outcome: Adequate for Discharge ?Goal: Will remain free from infection ?Outcome: Adequate for Discharge ?Goal: Diagnostic test results will improve ?Outcome: Adequate for Discharge ?Goal: Respiratory complications will improve ?12/24/2021 1613 by Ricardo Pitch, RN ?Outcome: Adequate for Discharge ?12/24/2021 1521 by Ricardo Pitch, RN ?Outcome: Progressing ?Goal: Cardiovascular complication will be avoided ?12/24/2021 1613 by Ricardo Pitch, RN ?Outcome: Adequate for Discharge ?12/24/2021 1521 by Ricardo Pitch, RN ?Outcome: Progressing ?  ?Problem: Activity: ?Goal: Risk for activity intolerance will decrease ?Outcome: Adequate for Discharge ?  ?Problem: Nutrition: ?Goal: Adequate nutrition will be maintained ?12/24/2021 1613 by Ricardo Pitch, RN ?Outcome: Adequate for Discharge ?12/24/2021 1521 by Ricardo Pitch, RN ?Outcome: Progressing ?  ?Problem: Coping: ?Goal: Level of anxiety will decrease ?Outcome: Adequate for Discharge ?  ?Problem: Elimination: ?Goal: Will not experience complications related to bowel motility ?Outcome: Adequate for Discharge ?Goal: Will not experience complications related to urinary retention ?Outcome: Adequate for Discharge ?  ?Problem: Pain Managment: ?Goal: General experience of comfort will improve ?Outcome: Adequate for Discharge ?  ?Problem: Safety: ?Goal: Ability to remain free from injury will improve ?Outcome: Adequate for Discharge ?  ?Problem: Skin Integrity: ?Goal: Risk for impaired skin  integrity will decrease ?Outcome: Adequate for Discharge ?  ?

## 2021-12-24 NOTE — Discharge Summary (Signed)
?Physician Discharge Summary ?  ?Patient: Ricardo [name] MRN: [id number] DOB: 11-Dec-1965  ?Admit date:     12/21/2021  ?Discharge date: 12/24/2021  ?Discharge Physician: Cordelia Poche, MD  ? ?PCP: [name], MD  ? ?Recommendations at discharge:  ? ?Home with hospice ? ?Discharge Diagnoses: ?Principal Problem: ?  AKI (acute kidney injury) ([place]) ?Active Problems: ?  SBO (small bowel obstruction) ([place]) ?  Nausea and vomiting ?  Hyperlipidemia ?  metastatic cecal cancer ?  Leukocytosis ?  GERD (gastroesophageal reflux disease) ?  Normocytic anemia ?  Comfort measures only status ?  Hypokalemia ?  Hypermagnesemia ?  Left leg DVT ([place]) ?  Hypernatremia ? ?Resolved Problems: ?  * No resolved hospital problems. * ? ?Hospital Course: ? ?Had patient presented after accidentally dislodging his PEG tube found to have hypotension in addition to hypernatremia.  PEG tube reinserted and patient started on IV fluids.  Patient also with chronic pain from metastatic cancer.  Palliative care was consulted.  Dilaudid PCA pump was initiated in addition to patient's fentanyl patch.  Decision was made to transition to full comfort measures and discharged home with hospice.  While admitted, patient did receive empiric cefepime and Flagyl for leukocytosis concerning for possible intra-abdominal infection.  Antibiotics discontinued after transition to comfort measures.  Patient was set up with home hospice and outpatient Dilaudid PCA with help of palliative care.  While admitted, patient was also found to have an acute left lower extremity DVT involving the common femoral vein, left femoral vein, left proximal foot profunda vein, left popliteal vein, left posterior tibialis vein, left peroneal veins and left gastrocnemius vein.  Patient was on Xarelto prior to admission and was transitioned to Lovenox for treatment of acute DVT. ? ? ? ?  ? ? ?Consultants: Palliative care medicine  ?Disposition:  Home with hospice ?Diet recommendation:  Tube feeds/comfort ? ?DISCHARGE MEDICATION: ?Allergies as of 12/24/2021   ? ?   Reactions  ? Oxaliplatin Hives, Other (See Comments)  ? 15 minutes into infusion, patient started to complain of feeling warm and states " it feels like the last time I had my reaction"  ? ?  ? ?  ?Medication List  ?  ? ?STOP taking these medications   ? ?DULoxetine 30 MG capsule ?Commonly known as: CYMBALTA ?  ?feeding supplement (BOOST BREEZE) Liqd ?  ?HYDROmorphone HCl 1 MG/ML Liqd ?Commonly known as: DILAUDID ?  ?naloxone 4 MG/0.1ML Liqd nasal spray kit ?Commonly known as: NARCAN ?  ?polyethylene glycol 17 g packet ?Commonly known as: MIRALAX / GLYCOLAX ?  ?prochlorperazine 10 MG tablet ?Commonly known as: COMPAZINE ?  ?rivaroxaban 20 MG Tabs tablet ?Commonly known as: XARELTO ?  ? ?  ? ?TAKE these medications   ? ?enoxaparin 100 MG/ML injection ?Commonly known as: LOVENOX ?Inject 1 syringe (120m) into the skin every 12 hours for 15 days. ?  ?fentaNYL 100 MCG/HR ?Commonly known as: DVillalba?Place 2 patches onto the skin every 3 (three) days. Along with 50 MCG/HR patch for a total of 250 MCG/HR dose. ?  ?fentaNYL 50 MCG/HR ?Commonly known as: DSycamore?Place 1 patch onto the skin every 3 (three) days. Along with 2 of 100 MCG/HR patch for a total of 250 MCG/HR dose. ?  ?HYDROmorphone 1 mg/mL injection ?Commonly known as: DILAUDID ?Administer Hydromorphone IV via CADD PCA ?PCA Bolus dose: 165m?Lock Out: 15 min ?Total 1 hour dose limit: 67m48mBasal: None ?Bag volume and concentration [facility]. ?  Total 7 day: 623m ?  ?LORazepam 0.5 MG tablet ?Commonly known as: ATIVAN ?Take 1 tablet (0.5 mg total) by mouth every 6 (six) hours as needed for anxiety. ?  ?pantoprazole sodium 40 mg ?Commonly known as: PROTONIX ?Place 40 mg into feeding tube daily. ?  ?promethazine 25 MG suppository ?Commonly known as: PHENERGAN ?Place 1 suppository (25 mg total) rectally every 6 (six) hours as needed for nausea or vomiting. ?  ?sodium  chloride flush 0.9 % Soln ?Commonly known as: NS ?Per hospice protocol ?  ? ?  ? ? ?Discharge Exam: ?Filed Weights  ? 12/21/21 0209 12/21/21 1407  ?Weight: 93.9 kg 93.7 kg  ? ?General exam: Chronically ill appearing ? ?Condition at discharge: worsening ? ?The results of significant diagnostics from this hospitalization (including imaging, microbiology, ancillary and laboratory) are listed below for reference.  ? ?Imaging Studies: ?DG Abdomen 1 View ? ?Result Date: 12/05/2021 ?CLINICAL DATA:  Evaluate location of gastrostomy tube EXAM: ABDOMEN - 1 VIEW COMPARISON:  09/30/2021 FINDINGS: Contrast injected through the gastrostomy tube is seen in the lumen of stomach. There is no extravasation of contrast. Bowel gas pattern is nonspecific. IMPRESSION: Tip of gastrostomy tube is noted in the lumen of stomach. There is no extravasation of contrast. Electronically Signed   By: PElmer PickerM.D.   On: 12/05/2021 14:12  ? ?CT ABDOMEN PELVIS W CONTRAST ? ?Result Date: 12/21/2021 ?CLINICAL DATA:  Metastatic cecal cancer with abdominal pain. Worsening nausea and vomiting, chills and weakness. Hypotensive in triage. Currently undergoing chemotherapy last treatment 3 weeks ago. EXAM: CT ABDOMEN AND PELVIS WITH CONTRAST TECHNIQUE: Multidetector CT imaging of the abdomen and pelvis was performed using the standard protocol following bolus administration of intravenous contrast. RADIATION DOSE REDUCTION: This exam was performed according to the departmental dose-optimization program which includes automated exposure control, adjustment of the mA and/or kV according to patient size and/or use of iterative reconstruction technique. CONTRAST:  1017mOMNIPAQUE IOHEXOL 300 MG/ML  SOLN COMPARISON:  There are multiple prior CTs from last year. The 2 most recent are CTs with IV contrast 09/02/2021 and 08/25/2021. FINDINGS: Lower chest: There is a small layering increased left pleural effusion, previously minimal. There are several  scattered tiny nodules along the major fissure in the right lower lung field which were not seen previously and probably intrapulmonary lymph nodes. No parenchymal nodule is seen. There is scattered atelectasis in the bases. The cardiac size is normal. Hepatobiliary: The liver is mildly steatotic but no mass enhancement is seen. The gallbladder distended but has a normal wall thickness. No calcified stones or biliary dilatation. Pancreas: Partially atrophic and otherwise unremarkable. Spleen: No mass enhancement.  No splenomegaly. Adrenals/Urinary Tract: Adrenal glands and kidneys unremarkable. There is no urinary stone or obstruction. There is no bladder thickening. Stomach/Bowel: A PEG tube is inserted since the prior studies. The stomach is decompressed. The duodenum and the most proximal jejunum are unremarkable, but beginning in the proximal jejunum there are again noted clumped small bowel segments adhesive to the anterior wall, with dilatation in the more superior of these loops. The most dilated of these segments is 4 cm, with the more inferior of these segments normal caliber. As before there are multiple angulated segments in the clumped bowel therefore a discrete transitional segment is difficult to identify. The clumped segments show increased wall thickening and wall enhancement both in the dilated and nondilated segments. The appendix is not seen. The large intestine is decompressed. There are uncomplicated sigmoid diverticula. Vascular/Lymphatic:  There is aortic atherosclerosis without aneurysm. There is relatively increased number of visible mesenteric root nodes compared to prior studies. These are not enlarged by strict size criteria but are larger than previously with largest of these 7.6 mm in short axis. There are shotty subcentimeter ileocolic mesenteric nodes which are stable. No pelvic adenopathy. Reproductive: No prostatomegaly.The testicles appear to be retracted into the inguinal canals.  Other: There are small inguinal fat hernias, larger on the left. Again noted is a small amount of ascites in the right subhepatic space, and generalized stranding in the omentum which was seen previously and se

## 2021-12-24 NOTE — Plan of Care (Signed)
  Problem: Clinical Measurements: Goal: Respiratory complications will improve Outcome: Progressing Goal: Cardiovascular complication will be avoided Outcome: Progressing   Problem: Nutrition: Goal: Adequate nutrition will be maintained Outcome: Progressing   

## 2021-12-25 DIAGNOSIS — E870 Hyperosmolality and hypernatremia: Secondary | ICD-10-CM

## 2021-12-26 ENCOUNTER — Other Ambulatory Visit: Payer: Self-pay | Admitting: Hematology

## 2021-12-26 ENCOUNTER — Other Ambulatory Visit: Payer: Self-pay | Admitting: *Deleted

## 2021-12-26 LAB — CULTURE, BLOOD (ROUTINE X 2)
Culture: NO GROWTH
Culture: NO GROWTH
Special Requests: ADEQUATE
Special Requests: ADEQUATE

## 2021-12-26 MED ORDER — PANTOPRAZOLE SODIUM 40 MG PO PACK: 40.0000 mg | PACK | Freq: Every day | ORAL | 1 refills | Status: AC

## 2021-12-29 ENCOUNTER — Telehealth: Payer: Self-pay

## 2021-12-30 ENCOUNTER — Ambulatory Visit: Payer: BC Managed Care – PPO | Admitting: Physician Assistant

## 2021-12-31 ENCOUNTER — Other Ambulatory Visit: Payer: Self-pay

## 2022-01-12 ENCOUNTER — Other Ambulatory Visit: Payer: Self-pay | Admitting: Hematology

## 2022-01-12 NOTE — Telephone Encounter (Signed)
Notified Patient of prior authorization approval for Pantoprazole 40 mg Packets. Medication is approved through 12/27/2022.  ?

## 2022-01-12 DEATH — deceased

## 2022-07-15 IMAGING — DX DG ABD PORTABLE 1V
1 series · 1 of 1 positions shown · non-contrast
Comparison: Same day.

CLINICAL DATA: Small bowel obstruction.  History of colon cancer.

EXAM:
PORTABLE ABDOMEN - 1 VIEW

[abdomen kub]
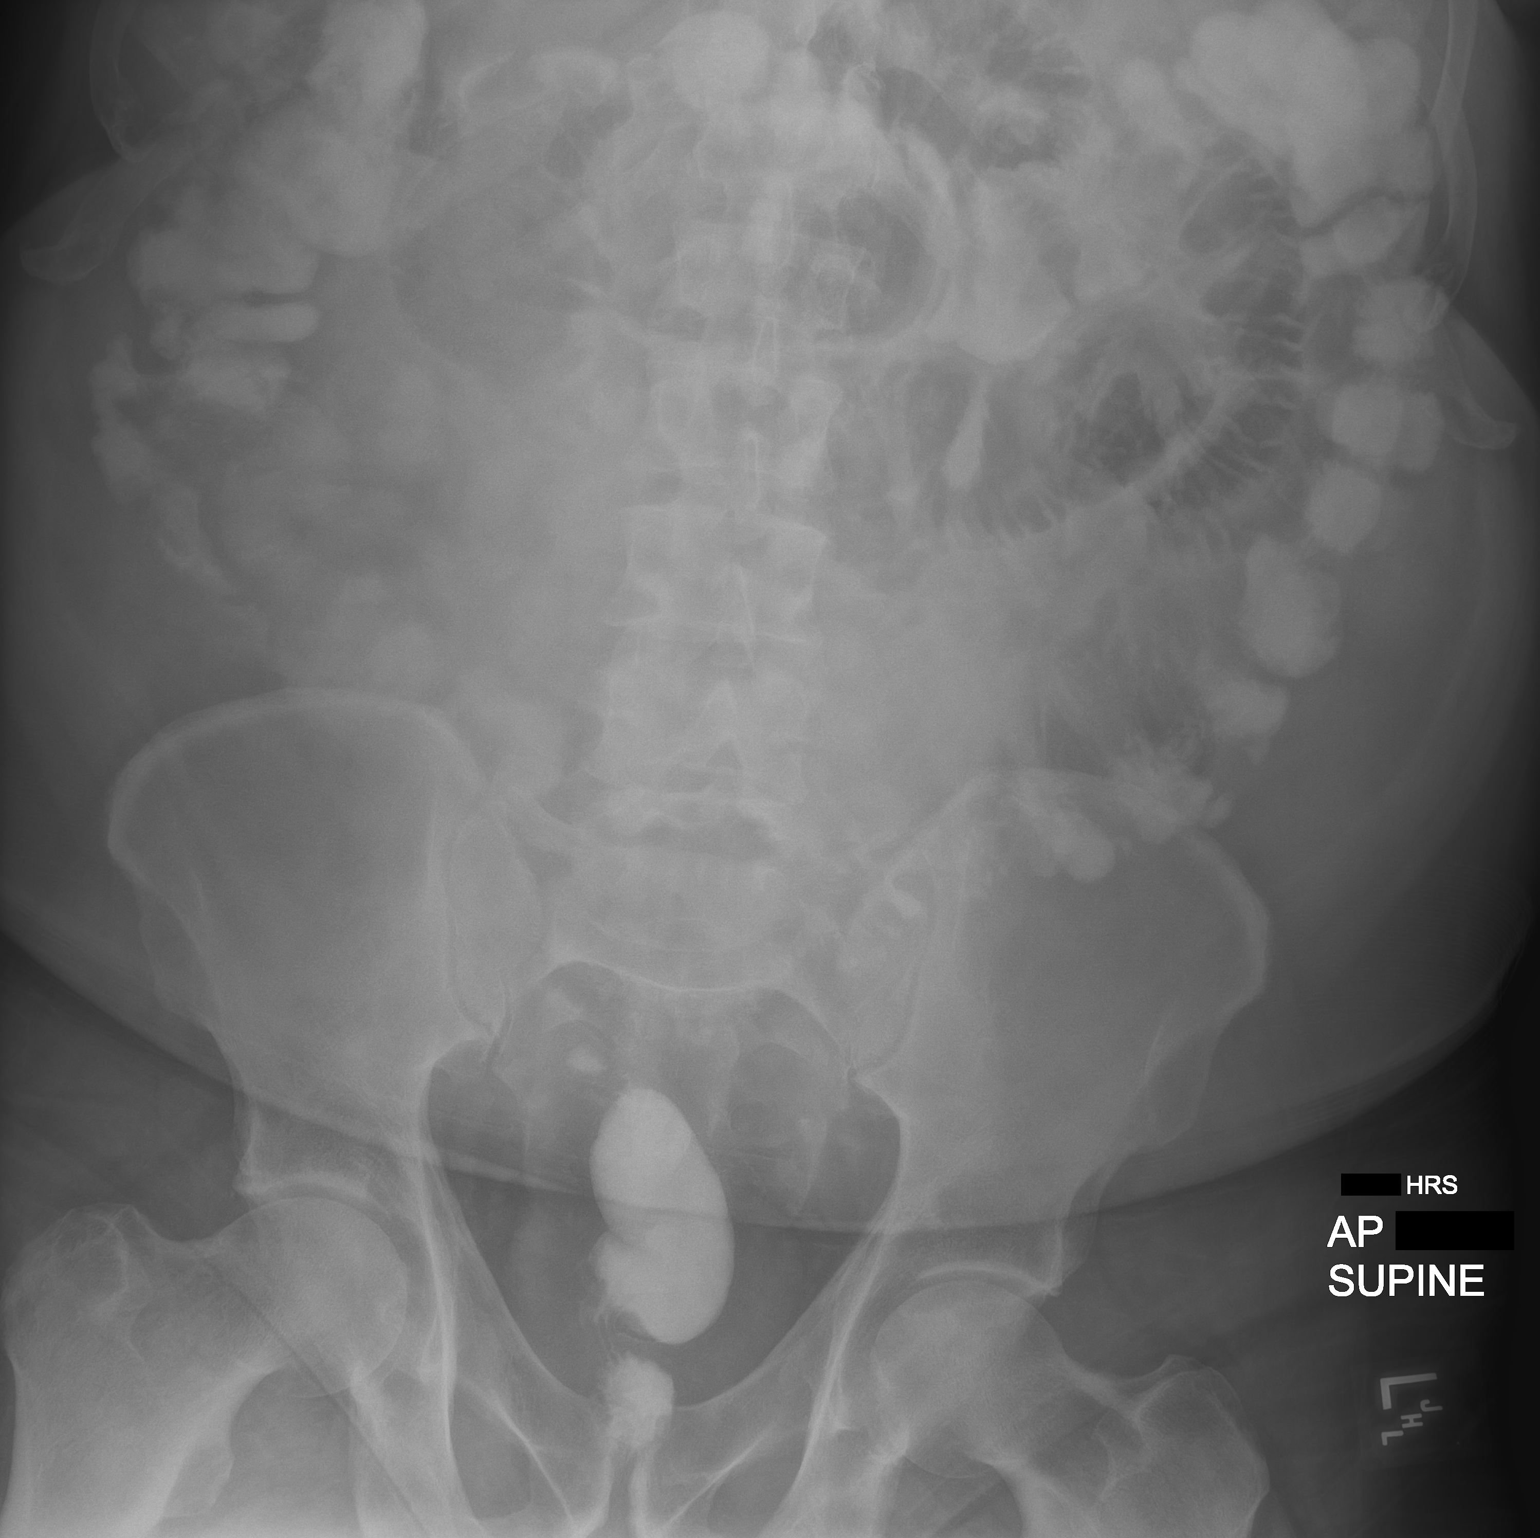

[1 of 1 positions shown; findings below may reference images not displayed]

FINDINGS: Residual contrast is seen through the nondilated colon. Mildly
dilated small bowel loops are noted in the upper abdomen concerning
for ileus or distal small bowel obstruction.
IMPRESSION: Mildly dilated small bowel loops are again noted in upper abdomen
concerning for distal small bowel obstruction. Residual contrast is
noted in nondilated colon.

## 2022-07-15 IMAGING — CR DG ABDOMEN ACUTE W/ 1V CHEST
4 series · 4 of 4 positions shown · non-contrast
Comparison: CT with IV contrast 08/25/2021

CLINICAL DATA: Metastatic colon cancer. Abdominal pain left lower
quadrant with vomiting.

EXAM:
DG ABDOMEN ACUTE WITH 1 VIEW CHEST

[w chest pa]
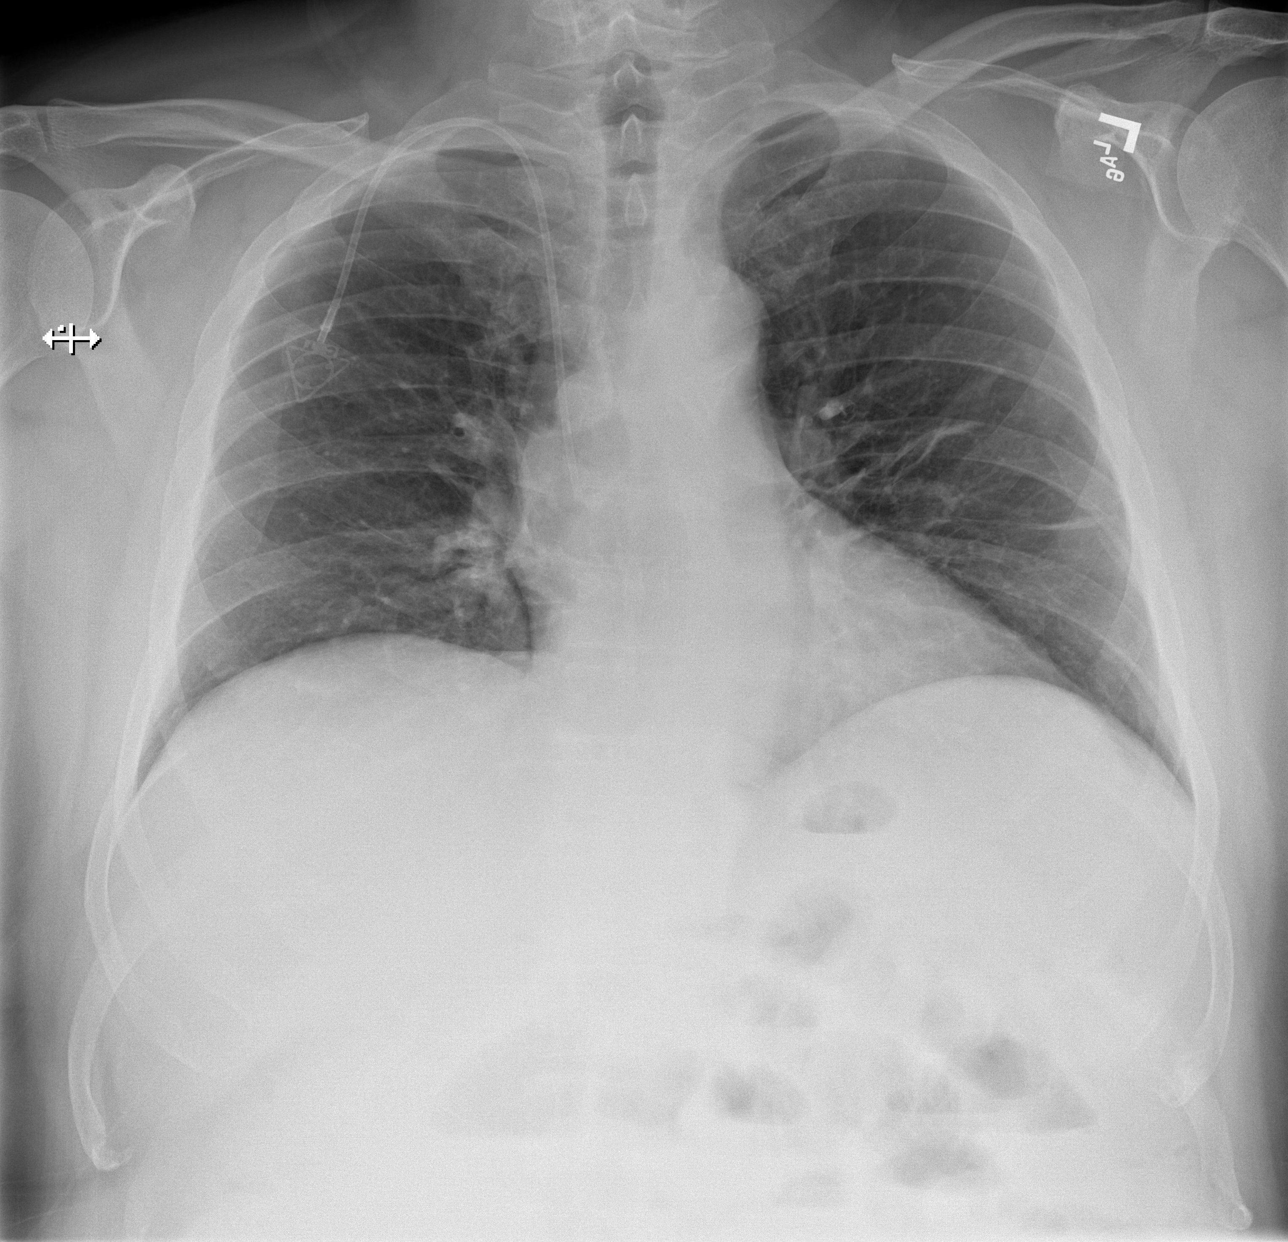

[w abdomen upright]
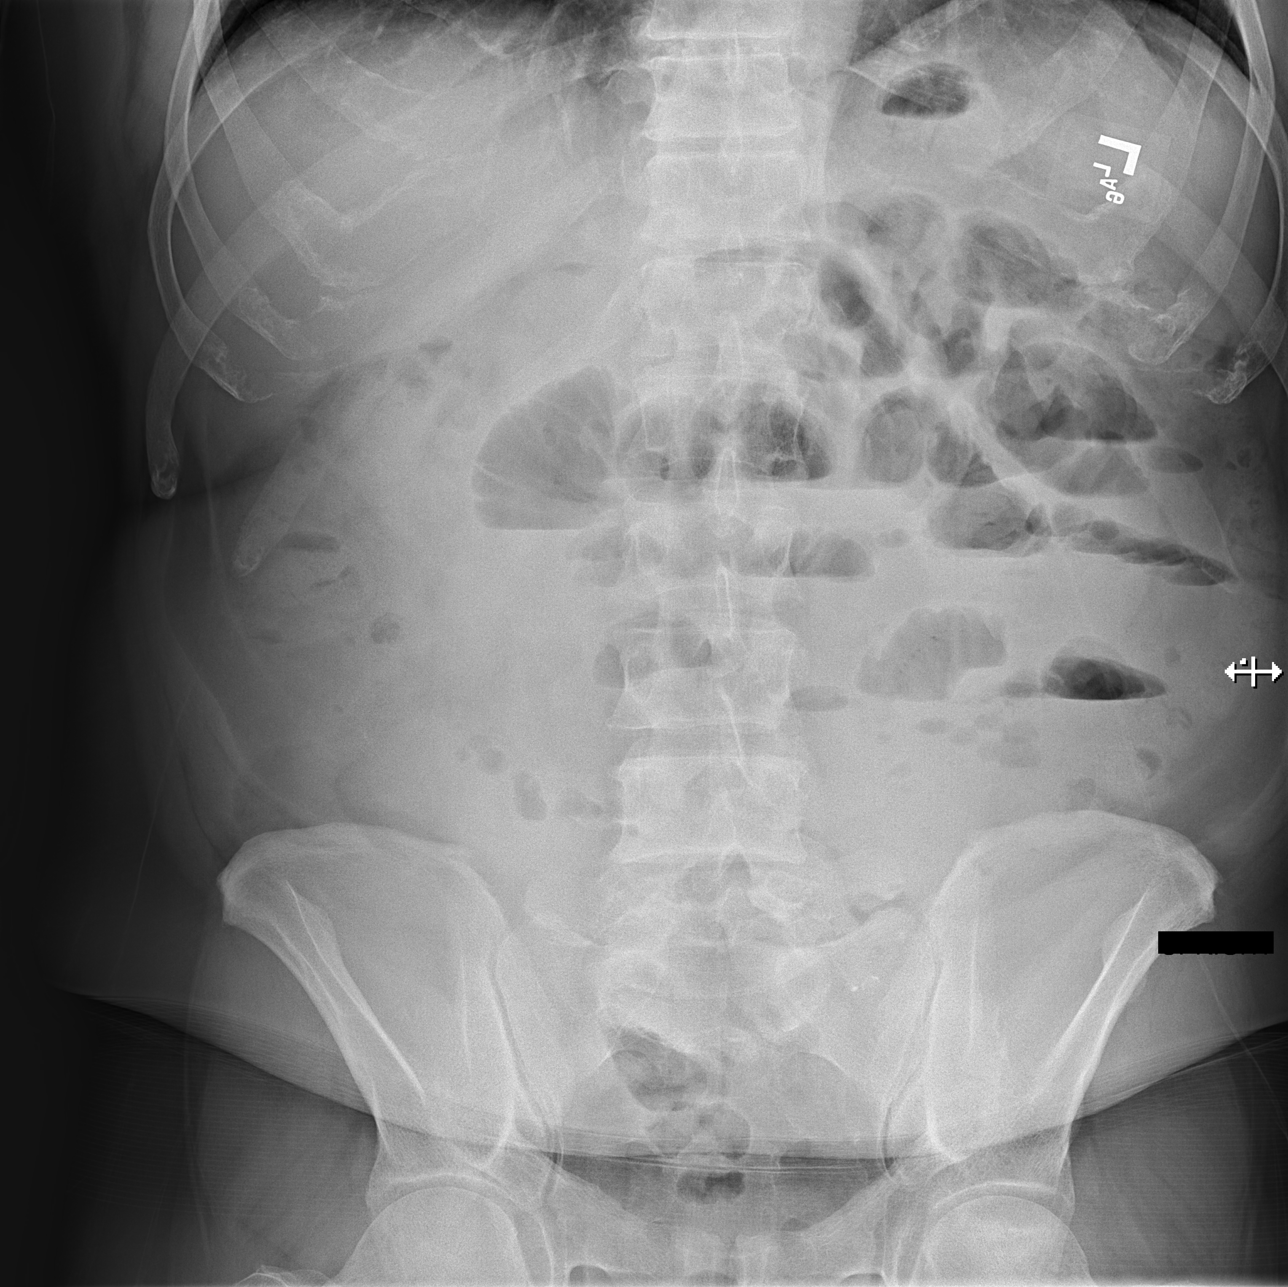

[t abdomen supine (1 of 2)]
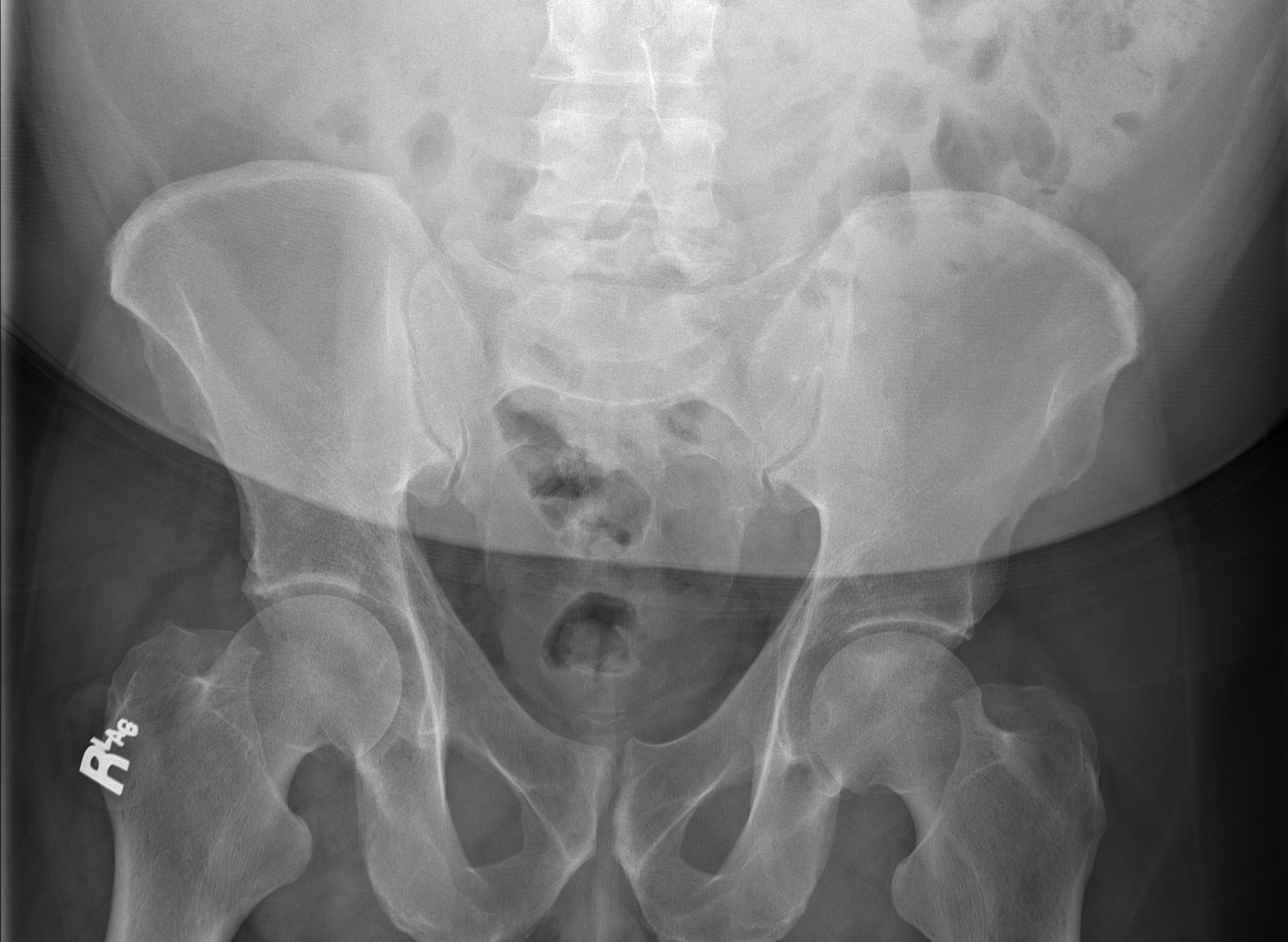

[t abdomen supine (2 of 2)]
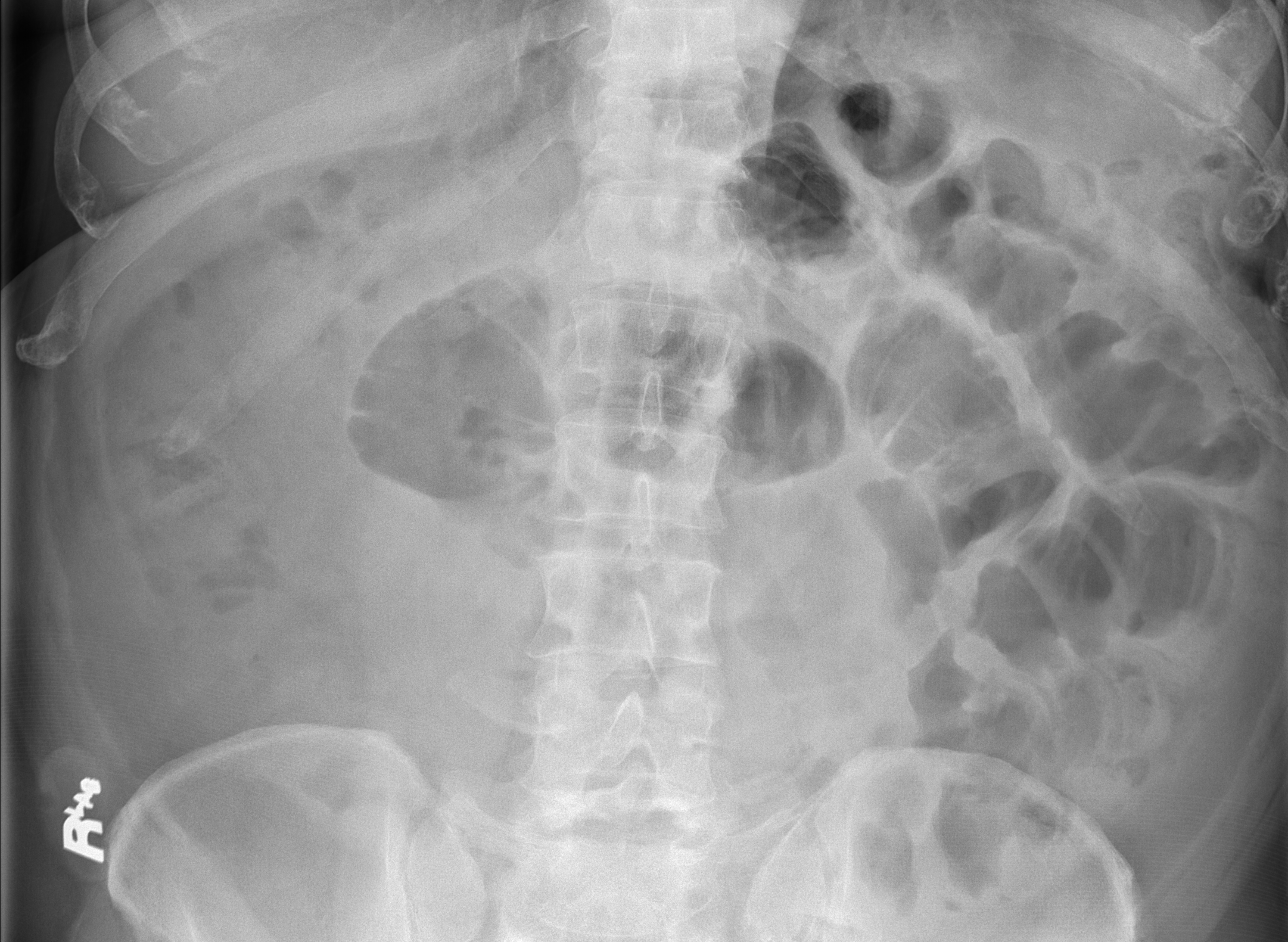

[4 of 4 positions shown; findings below may reference images not displayed]

FINDINGS: There is increased dilatation of left mid to lower abdominal small
bowel up to 5 cm concerning for small bowel obstruction. Small
amount of scattered gas and stool remain present in the colon.

There are stable visceral shadows. There is no evidence of free air.
No pathologic calcifications.

The lungs are generally clear aside from perihilar linear
atelectatic changes on the left. No pleural effusion is seen.

The cardiac size is normal. Right IJ port catheter tip remains at
the cavoatrial junction.
IMPRESSION: Increased dilatation of the left abdominal small bowel up to 5 cm
concerning for small bowel obstruction. In all other respects no
further changes.

## 2022-10-23 IMAGING — DX DG ABDOMEN 1V
1 series · 1 of 1 positions shown · non-contrast
Comparison: 09/30/2021

CLINICAL DATA: Evaluate location of gastrostomy tube

EXAM:
ABDOMEN - 1 VIEW

[abdomen kub]
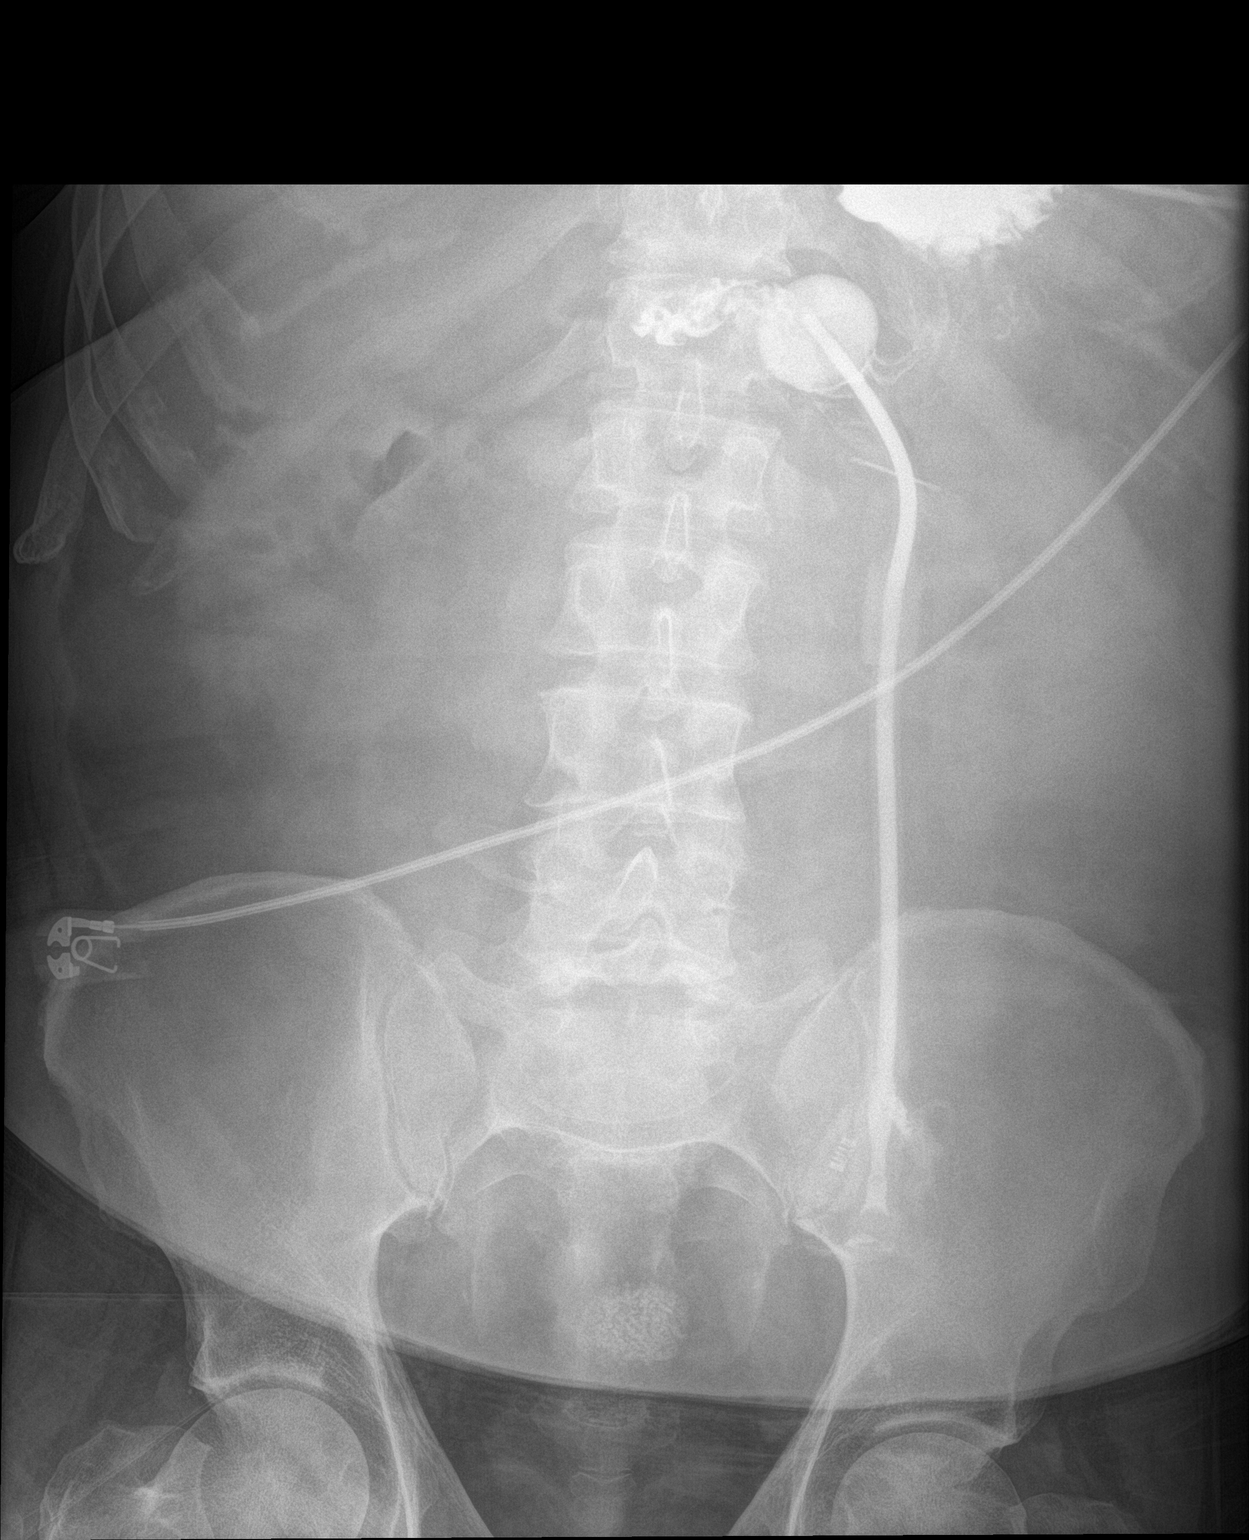

[1 of 1 positions shown; findings below may reference images not displayed]

FINDINGS: Contrast injected through the gastrostomy tube is seen in the lumen
of stomach. There is no extravasation of contrast. Bowel gas pattern
is nonspecific.
IMPRESSION: Tip of gastrostomy tube is noted in the lumen of stomach. There is
no extravasation of contrast.

## 2023-08-04 NOTE — Telephone Encounter (Signed)
Telephone call  

## 2024-06-29 NOTE — Progress Notes (Signed)
 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 Amended report date is 06/28/2024.
